# Patient Record
Sex: Male | Born: 1950 | Race: Black or African American | Hispanic: No | Smoking: Never smoker
Health system: Southern US, Community
[De-identification: ages and names within clinical notes are randomized; demographics above are authoritative.]

## PROBLEM LIST (undated history)

## (undated) DIAGNOSIS — I82409 Acute embolism and thrombosis of unspecified deep veins of unspecified lower extremity: Secondary | ICD-10-CM

## (undated) DIAGNOSIS — I639 Cerebral infarction, unspecified: Secondary | ICD-10-CM

## (undated) DIAGNOSIS — C61 Malignant neoplasm of prostate: Secondary | ICD-10-CM

## (undated) DIAGNOSIS — T8611 Kidney transplant rejection: Secondary | ICD-10-CM

## (undated) DIAGNOSIS — I10 Essential (primary) hypertension: Secondary | ICD-10-CM

## (undated) DIAGNOSIS — I739 Peripheral vascular disease, unspecified: Secondary | ICD-10-CM

## (undated) HISTORY — PX: OTHER SURGICAL HISTORY: SHX169

## (undated) HISTORY — DX: Essential (primary) hypertension: I10

## (undated) HISTORY — DX: Acute embolism and thrombosis of unspecified deep veins of unspecified lower extremity: I82.409

## (undated) HISTORY — PX: COLONOSCOPY: SHX174

## (undated) HISTORY — DX: Cerebral infarction, unspecified: I63.9

## (undated) HISTORY — DX: Peripheral vascular disease, unspecified: I73.9

---

## 1997-10-02 ENCOUNTER — Encounter: Admission: RE | Admit: 1997-10-02 | Discharge: 1997-12-31 | Payer: Self-pay | Admitting: *Deleted

## 1998-01-29 ENCOUNTER — Encounter: Admission: RE | Admit: 1998-01-29 | Discharge: 1998-01-29 | Payer: Self-pay | Admitting: Family Medicine

## 1998-03-05 ENCOUNTER — Encounter: Admission: RE | Admit: 1998-03-05 | Discharge: 1998-03-05 | Payer: Self-pay | Admitting: Family Medicine

## 1998-04-16 ENCOUNTER — Encounter: Admission: RE | Admit: 1998-04-16 | Discharge: 1998-07-15 | Payer: Self-pay | Admitting: *Deleted

## 1998-05-29 ENCOUNTER — Encounter: Admission: RE | Admit: 1998-05-29 | Discharge: 1998-05-29 | Payer: Self-pay | Admitting: Family Medicine

## 1998-06-28 ENCOUNTER — Encounter: Admission: RE | Admit: 1998-06-28 | Discharge: 1998-06-28 | Payer: Self-pay | Admitting: Family Medicine

## 1998-07-19 ENCOUNTER — Encounter: Admission: RE | Admit: 1998-07-19 | Discharge: 1998-07-19 | Payer: Self-pay | Admitting: Family Medicine

## 1998-08-30 ENCOUNTER — Encounter: Admission: RE | Admit: 1998-08-30 | Discharge: 1998-08-30 | Payer: Self-pay | Admitting: Family Medicine

## 1998-09-03 ENCOUNTER — Encounter: Admission: RE | Admit: 1998-09-03 | Discharge: 1998-09-03 | Payer: Self-pay | Admitting: Family Medicine

## 1998-09-20 ENCOUNTER — Encounter: Admission: RE | Admit: 1998-09-20 | Discharge: 1998-09-20 | Payer: Self-pay | Admitting: Family Medicine

## 1998-10-15 ENCOUNTER — Encounter: Admission: RE | Admit: 1998-10-15 | Discharge: 1999-01-13 | Payer: Self-pay | Admitting: *Deleted

## 1998-10-26 ENCOUNTER — Encounter: Admission: RE | Admit: 1998-10-26 | Discharge: 1998-10-26 | Payer: Self-pay | Admitting: Family Medicine

## 1998-12-07 ENCOUNTER — Encounter: Admission: RE | Admit: 1998-12-07 | Discharge: 1998-12-07 | Payer: Self-pay | Admitting: *Deleted

## 1999-06-10 ENCOUNTER — Encounter: Admission: RE | Admit: 1999-06-10 | Discharge: 1999-06-10 | Payer: Self-pay | Admitting: Family Medicine

## 1999-06-17 ENCOUNTER — Encounter: Admission: RE | Admit: 1999-06-17 | Discharge: 1999-06-17 | Payer: Self-pay | Admitting: Family Medicine

## 1999-06-24 ENCOUNTER — Encounter: Admission: RE | Admit: 1999-06-24 | Discharge: 1999-06-24 | Payer: Self-pay | Admitting: Family Medicine

## 1999-07-01 ENCOUNTER — Encounter: Admission: RE | Admit: 1999-07-01 | Discharge: 1999-07-01 | Payer: Self-pay | Admitting: Family Medicine

## 1999-07-01 ENCOUNTER — Encounter: Admission: RE | Admit: 1999-07-01 | Discharge: 1999-09-29 | Payer: Self-pay | Admitting: *Deleted

## 1999-07-09 ENCOUNTER — Encounter: Admission: RE | Admit: 1999-07-09 | Discharge: 1999-07-09 | Payer: Self-pay | Admitting: Sports Medicine

## 1999-07-24 ENCOUNTER — Encounter: Admission: RE | Admit: 1999-07-24 | Discharge: 1999-07-24 | Payer: Self-pay | Admitting: Family Medicine

## 2000-02-20 ENCOUNTER — Encounter: Admission: RE | Admit: 2000-02-20 | Discharge: 2000-02-20 | Payer: Self-pay | Admitting: Family Medicine

## 2000-04-08 ENCOUNTER — Encounter: Admission: RE | Admit: 2000-04-08 | Discharge: 2000-04-08 | Payer: Self-pay | Admitting: Family Medicine

## 2000-10-29 ENCOUNTER — Encounter: Admission: RE | Admit: 2000-10-29 | Discharge: 2000-10-29 | Payer: Self-pay | Admitting: Sports Medicine

## 2000-11-10 ENCOUNTER — Encounter: Admission: RE | Admit: 2000-11-10 | Discharge: 2000-11-10 | Payer: Self-pay | Admitting: Family Medicine

## 2000-12-07 ENCOUNTER — Encounter: Admission: RE | Admit: 2000-12-07 | Discharge: 2000-12-07 | Payer: Self-pay | Admitting: Sports Medicine

## 2001-02-09 ENCOUNTER — Encounter: Admission: RE | Admit: 2001-02-09 | Discharge: 2001-02-09 | Payer: Self-pay | Admitting: Family Medicine

## 2001-03-01 ENCOUNTER — Encounter: Admission: RE | Admit: 2001-03-01 | Discharge: 2001-03-01 | Payer: Self-pay | Admitting: Family Medicine

## 2001-03-15 ENCOUNTER — Encounter: Admission: RE | Admit: 2001-03-15 | Discharge: 2001-03-15 | Payer: Self-pay | Admitting: Family Medicine

## 2001-04-26 ENCOUNTER — Encounter: Admission: RE | Admit: 2001-04-26 | Discharge: 2001-04-26 | Payer: Self-pay | Admitting: Family Medicine

## 2001-08-09 ENCOUNTER — Encounter: Admission: RE | Admit: 2001-08-09 | Discharge: 2001-08-09 | Payer: Self-pay | Admitting: Family Medicine

## 2001-09-21 ENCOUNTER — Encounter: Admission: RE | Admit: 2001-09-21 | Discharge: 2001-09-21 | Payer: Self-pay | Admitting: Family Medicine

## 2001-11-22 ENCOUNTER — Encounter: Admission: RE | Admit: 2001-11-22 | Discharge: 2001-11-22 | Payer: Self-pay | Admitting: Family Medicine

## 2002-03-01 ENCOUNTER — Encounter: Admission: RE | Admit: 2002-03-01 | Discharge: 2002-03-01 | Payer: Self-pay | Admitting: Sports Medicine

## 2002-03-29 ENCOUNTER — Encounter: Admission: RE | Admit: 2002-03-29 | Discharge: 2002-03-29 | Payer: Self-pay | Admitting: Sports Medicine

## 2002-05-31 ENCOUNTER — Encounter: Admission: RE | Admit: 2002-05-31 | Discharge: 2002-05-31 | Payer: Self-pay | Admitting: Family Medicine

## 2003-01-16 ENCOUNTER — Encounter: Admission: RE | Admit: 2003-01-16 | Discharge: 2003-01-16 | Payer: Self-pay | Admitting: Sports Medicine

## 2004-01-15 ENCOUNTER — Encounter: Admission: RE | Admit: 2004-01-15 | Discharge: 2004-01-15 | Payer: Self-pay | Admitting: Sports Medicine

## 2004-01-22 ENCOUNTER — Encounter: Admission: RE | Admit: 2004-01-22 | Discharge: 2004-01-22 | Payer: Self-pay | Admitting: Sports Medicine

## 2004-12-19 ENCOUNTER — Ambulatory Visit: Payer: Self-pay | Admitting: Family Medicine

## 2006-03-27 ENCOUNTER — Ambulatory Visit: Payer: Self-pay | Admitting: Family Medicine

## 2006-05-06 ENCOUNTER — Ambulatory Visit: Payer: Self-pay | Admitting: Family Medicine

## 2006-07-30 DIAGNOSIS — I10 Essential (primary) hypertension: Secondary | ICD-10-CM | POA: Insufficient documentation

## 2006-07-30 DIAGNOSIS — E119 Type 2 diabetes mellitus without complications: Secondary | ICD-10-CM | POA: Insufficient documentation

## 2006-07-30 DIAGNOSIS — F5232 Male orgasmic disorder: Secondary | ICD-10-CM | POA: Insufficient documentation

## 2006-07-30 DIAGNOSIS — E669 Obesity, unspecified: Secondary | ICD-10-CM | POA: Insufficient documentation

## 2007-06-07 ENCOUNTER — Telehealth: Payer: Self-pay | Admitting: *Deleted

## 2007-07-02 ENCOUNTER — Ambulatory Visit: Payer: Self-pay | Admitting: Family Medicine

## 2008-01-02 ENCOUNTER — Inpatient Hospital Stay (HOSPITAL_COMMUNITY): Admission: EM | Admit: 2008-01-02 | Discharge: 2008-01-11 | Payer: Self-pay | Admitting: Emergency Medicine

## 2008-01-02 ENCOUNTER — Encounter: Payer: Self-pay | Admitting: Family Medicine

## 2008-01-02 ENCOUNTER — Ambulatory Visit: Payer: Self-pay | Admitting: Internal Medicine

## 2008-01-02 LAB — CONVERTED CEMR LAB: Hgb A1c MFr Bld: 10.3 %

## 2008-01-03 ENCOUNTER — Encounter (INDEPENDENT_AMBULATORY_CARE_PROVIDER_SITE_OTHER): Payer: Self-pay | Admitting: Neurology

## 2008-01-03 ENCOUNTER — Telehealth: Payer: Self-pay | Admitting: *Deleted

## 2008-01-03 DIAGNOSIS — I639 Cerebral infarction, unspecified: Secondary | ICD-10-CM

## 2008-01-03 HISTORY — DX: Cerebral infarction, unspecified: I63.9

## 2008-01-05 ENCOUNTER — Ambulatory Visit: Payer: Self-pay | Admitting: Physical Medicine & Rehabilitation

## 2008-03-23 ENCOUNTER — Encounter: Payer: Self-pay | Admitting: Family Medicine

## 2008-12-22 ENCOUNTER — Ambulatory Visit: Payer: Self-pay | Admitting: Family Medicine

## 2008-12-22 DIAGNOSIS — Z8679 Personal history of other diseases of the circulatory system: Secondary | ICD-10-CM | POA: Insufficient documentation

## 2009-01-03 ENCOUNTER — Encounter: Payer: Self-pay | Admitting: Family Medicine

## 2009-01-03 ENCOUNTER — Ambulatory Visit: Payer: Self-pay | Admitting: Family Medicine

## 2009-01-03 DIAGNOSIS — R252 Cramp and spasm: Secondary | ICD-10-CM | POA: Insufficient documentation

## 2009-01-03 LAB — CONVERTED CEMR LAB
BUN: 17 mg/dL (ref 6–23)
CO2: 23 meq/L (ref 19–32)
Chloride: 103 meq/L (ref 96–112)
Glucose, Bld: 102 mg/dL — ABNORMAL HIGH (ref 70–99)
Potassium: 4 meq/L (ref 3.5–5.3)

## 2009-01-09 ENCOUNTER — Telehealth: Payer: Self-pay | Admitting: Family Medicine

## 2009-01-19 ENCOUNTER — Telehealth: Payer: Self-pay | Admitting: Family Medicine

## 2009-01-31 ENCOUNTER — Ambulatory Visit: Payer: Self-pay | Admitting: Family Medicine

## 2009-02-01 ENCOUNTER — Telehealth: Payer: Self-pay | Admitting: Family Medicine

## 2009-02-02 ENCOUNTER — Telehealth: Payer: Self-pay | Admitting: Family Medicine

## 2009-02-07 ENCOUNTER — Telehealth: Payer: Self-pay | Admitting: Family Medicine

## 2009-02-08 ENCOUNTER — Telehealth: Payer: Self-pay | Admitting: *Deleted

## 2009-02-08 ENCOUNTER — Telehealth: Payer: Self-pay | Admitting: Family Medicine

## 2009-02-14 ENCOUNTER — Telehealth: Payer: Self-pay | Admitting: Family Medicine

## 2009-02-19 ENCOUNTER — Telehealth: Payer: Self-pay | Admitting: Family Medicine

## 2009-02-21 ENCOUNTER — Telehealth (INDEPENDENT_AMBULATORY_CARE_PROVIDER_SITE_OTHER): Payer: Self-pay | Admitting: Family Medicine

## 2009-02-21 ENCOUNTER — Telehealth: Payer: Self-pay | Admitting: Family Medicine

## 2009-02-28 ENCOUNTER — Ambulatory Visit: Payer: Self-pay | Admitting: Family Medicine

## 2009-03-28 ENCOUNTER — Telehealth: Payer: Self-pay | Admitting: Family Medicine

## 2009-04-02 ENCOUNTER — Telehealth: Payer: Self-pay | Admitting: Family Medicine

## 2009-04-09 ENCOUNTER — Ambulatory Visit: Payer: Self-pay | Admitting: Family Medicine

## 2009-04-16 ENCOUNTER — Telehealth: Payer: Self-pay | Admitting: Family Medicine

## 2009-04-17 ENCOUNTER — Telehealth: Payer: Self-pay | Admitting: *Deleted

## 2009-05-09 ENCOUNTER — Encounter (INDEPENDENT_AMBULATORY_CARE_PROVIDER_SITE_OTHER): Payer: Self-pay | Admitting: *Deleted

## 2009-05-28 ENCOUNTER — Ambulatory Visit: Payer: Self-pay | Admitting: Family Medicine

## 2009-08-08 ENCOUNTER — Encounter: Payer: Self-pay | Admitting: Family Medicine

## 2009-08-08 ENCOUNTER — Ambulatory Visit: Payer: Self-pay | Admitting: Family Medicine

## 2009-08-08 LAB — CONVERTED CEMR LAB
HDL: 40 mg/dL (ref >39)
Hgb A1c MFr Bld: 6.9 %
Total CHOL/HDL Ratio: 3.2
VLDL: 9 mg/dL (ref 0–40)

## 2009-08-10 ENCOUNTER — Encounter: Payer: Self-pay | Admitting: Family Medicine

## 2009-08-10 ENCOUNTER — Telehealth: Payer: Self-pay | Admitting: Family Medicine

## 2009-08-17 ENCOUNTER — Telehealth: Payer: Self-pay | Admitting: Family Medicine

## 2009-09-13 ENCOUNTER — Telehealth: Payer: Self-pay | Admitting: *Deleted

## 2009-10-01 ENCOUNTER — Ambulatory Visit: Payer: Self-pay | Admitting: Family Medicine

## 2009-10-02 ENCOUNTER — Encounter: Payer: Self-pay | Admitting: Family Medicine

## 2009-10-03 ENCOUNTER — Ambulatory Visit: Payer: Self-pay | Admitting: Family Medicine

## 2009-10-03 ENCOUNTER — Telehealth: Payer: Self-pay | Admitting: Family Medicine

## 2009-10-04 ENCOUNTER — Telehealth: Payer: Self-pay | Admitting: Family Medicine

## 2009-10-24 ENCOUNTER — Encounter: Payer: Self-pay | Admitting: Family Medicine

## 2009-10-25 ENCOUNTER — Ambulatory Visit: Payer: Self-pay | Admitting: Family Medicine

## 2009-11-28 ENCOUNTER — Encounter: Payer: Self-pay | Admitting: Family Medicine

## 2009-12-04 ENCOUNTER — Ambulatory Visit: Payer: Self-pay | Admitting: Family Medicine

## 2009-12-04 LAB — CONVERTED CEMR LAB: Hgb A1c MFr Bld: 6.3 %

## 2009-12-11 ENCOUNTER — Telehealth: Payer: Self-pay | Admitting: *Deleted

## 2009-12-14 ENCOUNTER — Telehealth: Payer: Self-pay | Admitting: *Deleted

## 2010-02-21 ENCOUNTER — Encounter: Payer: Self-pay | Admitting: Family Medicine

## 2010-02-21 ENCOUNTER — Ambulatory Visit: Payer: Self-pay | Admitting: Family Medicine

## 2010-02-21 DIAGNOSIS — E785 Hyperlipidemia, unspecified: Secondary | ICD-10-CM | POA: Insufficient documentation

## 2010-02-21 LAB — CONVERTED CEMR LAB
CO2: 24 meq/L (ref 19–32)
Calcium: 8.7 mg/dL (ref 8.4–10.5)
Creatinine, Ser: 1.29 mg/dL (ref 0.40–1.50)

## 2010-03-18 ENCOUNTER — Telehealth: Payer: Self-pay | Admitting: Family Medicine

## 2010-03-19 ENCOUNTER — Ambulatory Visit: Payer: Self-pay | Admitting: Family Medicine

## 2010-04-24 ENCOUNTER — Telehealth: Payer: Self-pay | Admitting: Family Medicine

## 2010-05-17 ENCOUNTER — Ambulatory Visit: Payer: Self-pay

## 2010-06-07 ENCOUNTER — Ambulatory Visit: Admission: RE | Admit: 2010-06-07 | Discharge: 2010-06-07 | Payer: Self-pay | Source: Home / Self Care

## 2010-07-02 NOTE — Assessment & Plan Note (Signed)
Summary: fu/kh  Patient arrived late, daughter rescheduled........................Marland Kitchen Garen Grams LPN  November 28, 2009 3:19 PM   Allergies: No Known Drug Allergies   Complete Medication List: 1)  Hydrochlorothiazide 25 Mg Tabs (Hydrochlorothiazide) .... Take 1 tab by mouth every morning 2)  Accupril 40 Mg Tabs (Quinapril hcl) .... Take 1 tab by mouth daily 3)  Crestor 5 Mg Tabs (Rosuvastatin calcium) .... Take 1 tab by mouth daily 4)  Plavix 75 Mg Tabs (Clopidogrel bisulfate) .... One by mouth daily 5)  Multivitamins Tabs (Multiple vitamin) .... One by mouth daily 6)  Sertraline Hcl 100 Mg Tabs (Sertraline hcl) .... One by mouth daily 7)  Baclofen 10 Mg Tabs (Baclofen) .... 1/2 tab by mouth three times a day 8)  Lantus 100 Unit/ml Soln (Insulin glargine) .... Inject 25 u sq bid disp qs for 3 months 9)  Novolog 100 Unit/ml Soln (Insulin aspart) .... Inject 15 u sq before meals and 5 u sq before snacks; disp qs x 3 months 10)  Quad Cane/small Base Misc (Misc. devices) .... Use as directed 11)  Sidekick Blood Glucose System Devi (Blood gluc meter disp-strips) .... Use as directed 12)  Blood Pressure Cuff Misc (Misc. devices) .... Use as directed.  pt. needs a large cuff. 13)  Carvedilol 25 Mg Tabs (Carvedilol) .... Take 1 tab by mouth twice a day 14)  Monoject Insulin Syringe 28g X 1/2" 1 Ml Misc (Insulin syringe-needle u-100) .... Use as directed 15)  Blood Glucose Test Strp (Glucose blood) .... Use as directed 16)  Amlodipine Besylate 10 Mg Tabs (Amlodipine besylate) .... Take 1 tab by mouth daily

## 2010-07-02 NOTE — Assessment & Plan Note (Signed)
Summary: f/u,df   Vital Signs:  Patient profile:   60 year old male Height:      63 inches Weight:      261 pounds BMI:     46.40 BSA:     2.17 Temp:     99.8 degrees F Pulse rate:   64 / minute BP sitting:   179 / 81  Vitals Entered By: Jone Baseman CMA (August 08, 2009 3:34 PM) CC: hx of stroke, htn, dm Is Patient Diabetic? Yes Did you bring your meter with you today? Yes Pain Assessment Patient in pain? no        Primary Care Provider:  Angelena Sole MD  CC:  hx of stroke, htn, and dm.  History of Present Illness: 1. Hx of stroke:  Pt is regaining some more of his right sided strength.  He is not going to physical therapy but has been working on it at home.  He is taking his plavix as prescribed.      ROS: denies any new numbness, weakness or visual problems  2. HTN: Pt has been taking his blood pressure medicine as prescribed.  He doesn't check his blood pressure at home regularly.  He has been trying to watch his salt intake but still thinks that he eats some (likes peanut butter).        ROS: denies any chest pain, shortness of breath, or lower extremity swelling  3. DMII:  He is taking his Insulin as prescribed.  He checks his blood sugar usually once a day.  They range between 90-160.  He doesn't eat a whole lot.  Daughter does a good job trying to get healthy foods.      Habits & Providers  Alcohol-Tobacco-Diet     Tobacco Status: never  Current Medications (verified): 1)  Hydrochlorothiazide 25 Mg  Tabs (Hydrochlorothiazide) .... Take 1 Tab By Mouth Every Morning 2)  Lisinopril 40 Mg Tabs (Lisinopril) .... Take 1 Tablet By Mouth Once A Day 3)  Zocor 20 Mg Tabs (Simvastatin) .... One Tab By Mouth Daily 4)  Plavix 75 Mg Tabs (Clopidogrel Bisulfate) .... One By Mouth Daily 5)  Multivitamins  Tabs (Multiple Vitamin) .... One By Mouth Daily 6)  Sertraline Hcl 100 Mg Tabs (Sertraline Hcl) .... One By Mouth Daily 7)  Baclofen 10 Mg Tabs (Baclofen) .... 1/2  Tab By Mouth Three Times A Day 8)  Lantus 100 Unit/ml Soln (Insulin Glargine) .... Inject 35 Units Subcutaneously Twice A Day; Disp Qs For 3 Months 9)  Novolog 100 Unit/ml Soln (Insulin Aspart) .... Inject 15 Units Subcutaneously Three Times A Day Before Meals; Disp Qs X 3 Months 10)  Quad Cane/small Base  Misc (Misc. Devices) .... Use As Directed 11)  Sidekick Blood Glucose System  Devi (Blood Gluc Meter Disp-Strips) .... Use As Directed 12)  Blood Pressure Cuff  Misc (Misc. Devices) .... Use As Directed.  Pt. Needs A Large Cuff. 13)  Carvedilol 25 Mg Tabs (Carvedilol) .... Take 1 Tab By Mouth Twice A Day 14)  Monoject Insulin Syringe 28g X 1/2" 1 Ml Misc (Insulin Syringe-Needle U-100) .... Use As Directed 15)  Blood Glucose Test  Strp (Glucose Blood) .... Use As Directed 16)  Amlodipine Besylate 10 Mg Tabs (Amlodipine Besylate) .... Take 1/2 Tab By Mouth Daily  Allergies: No Known Drug Allergies  Past History:  Past Medical History: Reviewed history from 12/22/2008 and no changes required. 1. 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 Diabetes.  3. Hypertension.  4. Dyslipidemia.  5. Urinary tract infection.  6. Obesity.  7. diabetic retinopathy  Social History: Reviewed history from 04/09/2009 and no changes required. Discharged from [facility] s/p CVA. Lives with his daughter who helps take care of him.  Has HHRN and HHPT. Family is close by and supportive. No alcohol, smoking, drugs. Daughter Camdin Hegner [phone] (cell),  [phone] (home)  Physical Exam  General:  Vitals reviewed. alert and obese.   Eyes:  vision grossly intact, pupils equal, and pupils round.   Mouth:  fair dentition.   Neck:  supple, no JVD Lungs:  normal respiratory effort.  no crackles and no wheezes.   Heart:  normal rate and regular rhythm.   Abdomen:  soft and non-tender.   Extremities:  no lower  extremity edema Skin:  turgor normal, color normal, no rashes, and no suspicious lesions.   Psych:  full affect. alert and appropriate with exam. not anxious appearing.     Impression & Recommendations:  Problem # 1:  CVA WITH RIGHT HEMIPARESIS (ICD-438.20) Assessment Unchanged  Doing better.  Will work on Patent attorney.  DM well controlled.  Will work on BP.  Will check Lipids. His updated medication list for this problem includes:    Plavix 75 Mg Tabs (Clopidogrel bisulfate) ..... One by mouth daily  Orders: [facility]- Est  Level 4 (29562)  Problem # 2:  HYPERTENSION, BENIGN SYSTEMIC (ICD-401.1) Assessment: Deteriorated Add Norvasc His updated medication list for this problem includes:    Hydrochlorothiazide 25 Mg Tabs (Hydrochlorothiazide) .Marland Kitchen... Take 1 tab by mouth every morning    Lisinopril 40 Mg Tabs (Lisinopril) .Marland Kitchen... Take 1 tablet by mouth once a day    Carvedilol 25 Mg Tabs (Carvedilol) .Marland Kitchen... Take 1 tab by mouth twice a day    Amlodipine Besylate 10 Mg Tabs (Amlodipine besylate) .Marland Kitchen... 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  Will check A1c today. His updated medication list for this problem includes:    Lisinopril 40 Mg Tabs (Lisinopril) .Marland Kitchen... Take 1 tablet by mouth once a day    Lantus 100 Unit/ml Soln (Insulin glargine) ..... Inject 35 units subcutaneously twice a day; disp qs for 3 months    Novolog 100 Unit/ml Soln (Insulin aspart) ..... Inject 15 units subcutaneously three times a day before meals; disp qs x 3 months  Orders: A1C-[facility] (28413) [facility]- Est  Level 4 (24401)  Complete Medication List: 1)  Hydrochlorothiazide 25 Mg Tabs (Hydrochlorothiazide) .... Take 1 tab by mouth every morning 2)  Lisinopril 40 Mg Tabs (Lisinopril) .... Take 1 tablet by mouth once a day 3)  Zocor 20 Mg Tabs  (Simvastatin) .... One tab by mouth daily 4)  Plavix 75 Mg Tabs (Clopidogrel bisulfate) .... One by mouth daily 5)  Multivitamins Tabs (Multiple vitamin) .... One by mouth daily 6)  Sertraline Hcl 100 Mg Tabs (Sertraline hcl) .... One by mouth daily 7)  Baclofen 10 Mg Tabs (Baclofen) .... 1/2 tab by mouth three times a day 8)  Lantus 100 Unit/ml Soln (Insulin glargine) .... Inject 35 units subcutaneously twice a day; disp qs for 3 months 9)  Novolog 100 Unit/ml Soln (Insulin aspart) .... Inject 15 units subcutaneously three times a day before meals; disp qs x 3 months  10)  Insurance underwriter (Misc. devices) .... Use as directed 11)  Sidekick Blood Glucose System Devi (Blood gluc meter disp-strips) .... Use as directed 12)  Blood Pressure Cuff Misc (Misc. devices) .... Use as directed.  pt. needs a large cuff. 13)  Carvedilol 25 Mg Tabs (Carvedilol) .... Take 1 tab by mouth twice a day 14)  Monoject Insulin Syringe 28g X 1/2" 1 Ml Misc (Insulin syringe-needle u-100) .... Use as directed 15)  Blood Glucose Test Strp (Glucose blood) .... Use as directed 16)  Amlodipine Besylate 10 Mg Tabs (Amlodipine besylate) .... Take 1/2 tab by mouth daily  Patient Instructions: 1)  I'm glad to hear that you continue to do better 2)  I would like to try and get your blood pressure under better control 3)  I am going to add another medicine, called Norvasc, start off taking 1/2 tab each day 4)  I am also going to check your cholesterol today and will let you know of the results 5)  Please schedule a follow up appointment in 4-6 weeks Prescriptions: PLAVIX 75 MG TABS (CLOPIDOGREL BISULFATE) one by mouth daily  #90 x 6   Entered and Authorized by:   Angelena Sole MD   Signed by:   Angelena Sole MD on 08/08/2009   Method used:   Print then Give to Patient   RxID:   [id number] AMLODIPINE BESYLATE 10 MG TABS (AMLODIPINE BESYLATE) Take 1/2 tab by mouth daily  #30 x 6   Entered and Authorized by:    Angelena Sole MD   Signed by:   Angelena Sole MD on 08/08/2009   Method used:   Print then Give to Patient   RxID:   [id number]   Prevention & Chronic Care Immunizations   Influenza vaccine: Fluvax 3+  (05/28/2009)    Tetanus booster: 08/31/2001: Done.   Tetanus booster due: 09/01/2011    Pneumococcal vaccine: Not documented  Colorectal Screening   Hemoccult: Done.  (05/02/2002)   Hemoccult due: 05/03/2003    Colonoscopy: Not documented  Other Screening   PSA: Not documented   Smoking status: never  (08/08/2009)  Diabetes Mellitus   HgbA1C: 6.9  (08/08/2009)   Hemoglobin A1C due: 09/30/2007    Eye exam: Not documented    Foot exam: yes  (12/22/2008)   High risk foot: Not documented   Foot care education: Not documented    Urine microalbumin/creatinine ratio: Not documented    Diabetes flowsheet reviewed?: Yes   Progress toward A1C goal: Unchanged  Lipids   Total Cholesterol: Not documented   LDL: Not documented   LDL Direct: Not documented   HDL: Not documented   Triglycerides: Not documented  Hypertension   Last Blood Pressure: 179 / 81  (08/08/2009)   Serum creatinine: 1.15  (01/03/2009)   Serum potassium 4.0  (01/03/2009)    Hypertension flowsheet reviewed?: Yes   Progress toward BP goal: Unchanged  Self-Management Support :   Personal Goals (by the next clinic visit) :     Personal A1C goal: 8  (02/28/2009)     Personal blood pressure goal: 130/80  (02/28/2009)     Personal LDL goal: 100  (02/28/2009)    Diabetes self-management support: Not documented    Hypertension self-management support: Not documented   Laboratory Results   Blood Tests   Date/Time Received: August 08, 2009 3:25 PM  Date/Time Reported: August 08, 2009 3:41 PM   HGBA1C: 6.9%   (Normal Range: Non-Diabetic - 3-6%  Control Diabetic - 6-8%)  Comments: ...............test performed by......Marland KitchenBonnie A. Swaz[place], MLS (ASCP)cm

## 2010-07-02 NOTE — Progress Notes (Signed)
Summary: Referral  Phone Note Call from Patient Call back at [phone]   Caller: Daughter Reason for Call: Referral Summary of Call: requesting dental referral for routine cleaning & possible dental work. Also wants to know if pt should get a shingles vaccine, has orange card.   Initial call taken by: Knox Royalty,  December 11, 2009 10:44 AM  Follow-up for Phone Call        They don't need a dental referral for routine dental care.  We generally recommend shingles vaccine at age 34. Follow-up by: Angelena Sole MD,  December 11, 2009 11:01 AM  Additional Follow-up for Phone Call Additional follow up Details #1::        Left message on vm informing that we would send in a referral to the dental clinic but there is a waiting list, and also informed about the shingles vaccine. Additional Follow-up by: Garen Grams LPN,  December 11, 2009 11:09 AM

## 2010-07-02 NOTE — Progress Notes (Signed)
Summary: refill  Phone Note Refill Request Call back at [phone] Message from:  Sophana-daughter  Refills Requested: Medication #1:  HYDROCHLOROTHIAZIDE 25 MG  TABS Take 1 tab by mouth every morning  Medication #2:  LISINOPRIL 40 MG TABS Take 1 tablet by mouth once a day  Medication #3:  ZOCOR 20 MG TABS one tab by mouth daily  Medication #4:  CARVEDILOL 25 MG TABS Take 1 tab by mouth twice a day Sertraline HCL 100mg  Baclofen 10mg  Novolog 100mg  Carvedilol 25mg   [facility]  Initial call taken by: De Nurse,  September 13, 2009 2:05 PM  Follow-up for Phone Call        calling about rx - not at pharm yet. Follow-up by: De Nurse,  September 17, 2009 11:11 AM    Prescriptions: NOVOLOG 100 UNIT/ML SOLN (INSULIN ASPART) inject 15 units Subcutaneously three times a day before meals; disp QS x 3 months  #1 x 3   Entered by:   Golden Circle RN   Authorized by:   Angelena Sole MD   Signed by:   Golden Circle RN on 09/17/2009   Method used:   Printed then faxed to ...       [facility] (retail)       [address] [place], [place]  [address]       Ph: [phone]       Fax: [phone]   RxID:   [id number] HYDROCHLOROTHIAZIDE 25 MG  TABS (HYDROCHLOROTHIAZIDE) Take 1 tab by mouth every morning  #180 x 3   Entered by:   Golden Circle RN   Authorized by:   Angelena Sole MD   Signed by:   Golden Circle RN on 09/17/2009   Method used:   Printed then faxed to ...       [facility] (retail)       [address] [place], [place]  [address]       Ph: [phone]       Fax: [phone]   RxID:   [id number]  dtr ststes she got most of these transferred but need the novalog. used to get from [place]. using health dept now. told her each med has refills. faxed rx for novalog.Golden Circle RN  September 17, 2009 11:20 AM

## 2010-07-02 NOTE — Assessment & Plan Note (Signed)
Summary: [name]   Vital Signs:  Patient profile:   60 year old male Height:      63 inches Weight:      259.2 pounds BMI:     46.08 Temp:     97.5 degrees F oral Pulse rate:   63 / minute BP sitting:   120 / 70  (left arm) Cuff size:   large  Vitals Entered By: Gladstone Pih (Oct 03, 2009 11:10 AM) CC: Discuss DM Is Patient Diabetic? Yes Did you bring your meter with you today? Yes   Primary Care Provider:  Angelena Sole MD  CC:  Discuss DM.  History of Present Illness: 1. Hypoglycemic episode:  Pt brought in by his daughter because he had a hypoglycemic episode last night.  He called her last night and was not making any sense.  His blood sugar was low.  Once he got something to eat he felt better.  This happens every once in a while maybe once a week.  It only happens if he doesn't have something at his bedside like fruit to eat when he starts feeling hypoglycemic.  He normally has a friend come by and bring him some fruit but he hasn't had any for the past couple of days.  Daughter is upset because he will not call and ask his family for help when he needs it.  He wouldn't have these hypoglycemic episodes if he was prepared.  He feels fine now in clinic.  Current Medications (verified): 1)  Hydrochlorothiazide 25 Mg  Tabs (Hydrochlorothiazide) .... Take 1 Tab By Mouth Every Morning 2)  Lisinopril 40 Mg Tabs (Lisinopril) .... Take 1 Tablet By Mouth Once A Day 3)  Zocor 20 Mg Tabs (Simvastatin) .... One Tab By Mouth Daily 4)  Plavix 75 Mg Tabs (Clopidogrel Bisulfate) .... One By Mouth Daily 5)  Multivitamins  Tabs (Multiple Vitamin) .... One By Mouth Daily 6)  Sertraline Hcl 100 Mg Tabs (Sertraline Hcl) .... One By Mouth Daily 7)  Baclofen 10 Mg Tabs (Baclofen) .... 1/2 Tab By Mouth Three Times A Day 8)  Lantus 100 Unit/ml Soln (Insulin Glargine) .... Inject 35 Units Subcutaneously Twice A Day; Disp Qs For 3 Months 9)  Novolog 100 Unit/ml Soln (Insulin Aspart) .... Inject 15  Units Subcutaneously Three Times A Day Before Meals; Disp Qs X 3 Months 10)  Quad Cane/small Base  Misc (Misc. Devices) .... Use As Directed 11)  Sidekick Blood Glucose System  Devi (Blood Gluc Meter Disp-Strips) .... Use As Directed 12)  Blood Pressure Cuff  Misc (Misc. Devices) .... Use As Directed.  Pt. Needs A Large Cuff. 13)  Carvedilol 25 Mg Tabs (Carvedilol) .... Take 1 Tab By Mouth Twice A Day 14)  Monoject Insulin Syringe 28g X 1/2" 1 Ml Misc (Insulin Syringe-Needle U-100) .... Use As Directed 15)  Blood Glucose Test  Strp (Glucose Blood) .... Use As Directed 16)  Amlodipine Besylate 10 Mg Tabs (Amlodipine Besylate) .... Take 1 Tab By Mouth Daily  Allergies (verified): No Known Drug Allergies  Past History:  Past Medical History: Reviewed history from 12/22/2008 and no changes required. 1. 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 Diabetes.  3. Hypertension.  4. Dyslipidemia.  5. Urinary tract infection.  6. Obesity.  7. diabetic retinopathy  Physical Exam  General:  Vitals reviewed. alert and obese.   Head:  normocephalic  and atraumatic.   Eyes:  vision grossly intact, pupils equal, and pupils round.   Mouth:  fair dentition.  MMM Lungs:  normal respiratory effort.  no crackles and no wheezes.   Heart:  normal rate and regular rhythm.   Abdomen:  soft and non-tender.   Neurologic:  Neuro deficits unchanged, right sided hemiparesis  Psych:  not anxious appearing and not depressed appearing.     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  Can be prevented with proper education and preparation.  Educated patient and daughter about acceptable snacks to have if feeling like his blood sugar is low including: fruits, granola.  HgA1C is well controlled so do not want to  make any medication changes.  If he is unable to plan ahead and have something available will need to decrease his evening dose of Lantus. His updated medication list for this problem includes:    Lisinopril 40 Mg Tabs (Lisinopril) .Marland Kitchen... Take 1 tablet by mouth once a day    Lantus 100 Unit/ml Soln (Insulin glargine) ..... Inject 35 units subcutaneously twice a day; disp qs for 3 months    Novolog 100 Unit/ml Soln (Insulin aspart) ..... Inject 15 units subcutaneously three times a day before meals; disp qs x 3 months  Orders: [facility]- Est Level  3 (16109)  Complete Medication List: 1)  Hydrochlorothiazide 25 Mg Tabs (Hydrochlorothiazide) .... Take 1 tab by mouth every morning 2)  Lisinopril 40 Mg Tabs (Lisinopril) .... Take 1 tablet by mouth once a day 3)  Zocor 20 Mg Tabs (Simvastatin) .... One tab by mouth daily 4)  Plavix 75 Mg Tabs (Clopidogrel bisulfate) .... One by mouth daily 5)  Multivitamins Tabs (Multiple vitamin) .... One by mouth daily 6)  Sertraline Hcl 100 Mg Tabs (Sertraline hcl) .... One by mouth daily 7)  Baclofen 10 Mg Tabs (Baclofen) .... 1/2 tab by mouth three times a day 8)  Lantus 100 Unit/ml Soln (Insulin glargine) .... Inject 35 units subcutaneously twice a day; disp qs for 3 months 9)  Novolog 100 Unit/ml Soln (Insulin aspart) .... Inject 15 units subcutaneously three times a day before meals; disp qs x 3 months 10)  Quad Cane/small Base Misc (Misc. devices) .... Use as directed 11)  Sidekick Blood Glucose System Devi (Blood gluc meter disp-strips) .... Use as directed 12)  Blood Pressure Cuff Misc (Misc. devices) .... Use as directed.  pt. needs a large cuff. 13)  Carvedilol 25 Mg Tabs (Carvedilol) .... Take 1 tab by mouth twice a day 14)  Monoject Insulin Syringe 28g X 1/2" 1 Ml Misc (Insulin syringe-needle u-100) .... Use as directed 15)  Blood Glucose Test Strp (Glucose blood) .... Use as directed 16)  Amlodipine Besylate 10 Mg Tabs (Amlodipine besylate) .... Take 1  tab by mouth daily  Patient Instructions: 1)  It is important for you to have something at the bedside when you start to feel like your blood sugar is low 2)  Good options are fruits 3)  If you continue to have low blood sugars we may need to decrease your insulin dose.

## 2010-07-02 NOTE — Assessment & Plan Note (Signed)
Summary: F/U VISIT/tcb   Vital Signs:  Patient profile:   60 year old male Height:      63 inches Weight:      258 pounds BMI:     45.87 BSA:     2.16 Temp:     98.4 degrees F Pulse rate:   82 / minute BP sitting:   149 / 73  Vitals Entered By: Jone Baseman CMA (Oct 01, 2009 2:13 PM) CC: f/u Is Patient Diabetic? No Pain Assessment Patient in pain? no        Primary Care Provider:  Angelena Sole MD  CC:  f/u.  History of Present Illness: 1. Hx of stroke:  Pt is regaining some more of his right sided strength.  He is not going to physical therapy but has been working on it at home.  He is taking his plavix as prescribed.      ROS: denies any new numbness, weakness or visual problems  2. HTN: Pt has been taking his blood pressure medicine as prescribed.  He does check his blood pressure at home regularly.  It has been ranging between 129/80 - 150/90.  He has been trying to watch his salt intake but still thinks that he eats some.  Daughter has made him cut back on peanut butter to one small jar a month.        ROS: denies any chest pain, shortness of breath, or lower extremity swelling  3. DMII:  He is taking his Insulin as prescribed.  He checks his blood sugar usually once a day.  They range between 40-210.  He doesn't eat a whole lot.  Daughter does a good job trying to get healthy foods.     Habits & Providers  Alcohol-Tobacco-Diet     Tobacco Status: never  Current Medications (verified): 1)  Hydrochlorothiazide 25 Mg  Tabs (Hydrochlorothiazide) .... Take 1 Tab By Mouth Every Morning 2)  Lisinopril 40 Mg Tabs (Lisinopril) .... Take 1 Tablet By Mouth Once A Day 3)  Zocor 20 Mg Tabs (Simvastatin) .... One Tab By Mouth Daily 4)  Plavix 75 Mg Tabs (Clopidogrel Bisulfate) .... One By Mouth Daily 5)  Multivitamins  Tabs (Multiple Vitamin) .... One By Mouth Daily 6)  Sertraline Hcl 100 Mg Tabs (Sertraline Hcl) .... One By Mouth Daily 7)  Baclofen 10 Mg Tabs  (Baclofen) .... 1/2 Tab By Mouth Three Times A Day 8)  Lantus 100 Unit/ml Soln (Insulin Glargine) .... Inject 35 Units Subcutaneously Twice A Day; Disp Qs For 3 Months 9)  Novolog 100 Unit/ml Soln (Insulin Aspart) .... Inject 15 Units Subcutaneously Three Times A Day Before Meals; Disp Qs X 3 Months 10)  Quad Cane/small Base  Misc (Misc. Devices) .... Use As Directed 11)  Sidekick Blood Glucose System  Devi (Blood Gluc Meter Disp-Strips) .... Use As Directed 12)  Blood Pressure Cuff  Misc (Misc. Devices) .... Use As Directed.  Pt. Needs A Large Cuff. 13)  Carvedilol 25 Mg Tabs (Carvedilol) .... Take 1 Tab By Mouth Twice A Day 14)  Monoject Insulin Syringe 28g X 1/2" 1 Ml Misc (Insulin Syringe-Needle U-100) .... Use As Directed 15)  Blood Glucose Test  Strp (Glucose Blood) .... Use As Directed 16)  Amlodipine Besylate 10 Mg Tabs (Amlodipine Besylate) .... Take 1 Tab By Mouth Daily  Allergies: No Known Drug Allergies  Past History:  Past Medical History: Reviewed history from 12/22/2008 and no changes required. 1. 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 Diabetes.  3. Hypertension.  4. Dyslipidemia.  5. Urinary tract infection.  6. Obesity.  7. diabetic retinopathy  Social History: Reviewed history from 04/09/2009 and no changes required. Discharged from [facility] s/p CVA. Lives with his daughter who helps take care of him.  Has HHRN and HHPT. Family is close by and supportive. No alcohol, smoking, drugs. Daughter Saquan Furtick [phone] (cell),  [phone] (home)  Physical Exam  General:  Vitals reviewed. alert and obese.   Eyes:  vision grossly intact, pupils equal, and pupils round.   Mouth:  fair dentition.   Neck:  supple, no JVD Lungs:  normal respiratory effort.  no crackles and no wheezes.   Heart:  normal rate and regular rhythm.   Extremities:  no lower extremity  edema Neurologic:  Neuro deficits unchanged, right sided hemiparesis  Psych:  full affect. alert and appropriate with exam. not anxious appearing.     Impression & Recommendations:  Problem # 1:  CVA WITH RIGHT HEMIPARESIS (ICD-438.20) Assessment Unchanged  Continue Plavix His updated medication list for this problem includes:    Plavix 75 Mg Tabs (Clopidogrel bisulfate) ..... One by mouth daily  Orders: [facility]- Est  Level 4 (19147)  Problem # 2:  HYPERTENSION, BENIGN SYSTEMIC (ICD-401.1) Assessment: Improved  Still not at goal.  Will increase Amlodipine to 10mg  daily. His updated medication list for this problem includes:    Hydrochlorothiazide 25 Mg Tabs (Hydrochlorothiazide) .Marland Kitchen... Take 1 tab by mouth every morning    Lisinopril 40 Mg Tabs (Lisinopril) .Marland Kitchen... Take 1 tablet by mouth once a day    Carvedilol 25 Mg Tabs (Carvedilol) .Marland Kitchen... Take 1 tab by mouth twice a day    Amlodipine Besylate 10 Mg Tabs (Amlodipine besylate) .Marland Kitchen... 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  CBGs on monitor are fairly well controlled.  Continue current regimen. His updated medication list for this problem includes:    Lisinopril 40 Mg Tabs (Lisinopril) .Marland Kitchen... Take 1 tablet by mouth once a day    Lantus 100 Unit/ml Soln (Insulin glargine) ..... Inject 35 units subcutaneously twice a day; disp qs for 3 months    Novolog 100 Unit/ml Soln (Insulin aspart) ..... Inject 15 units subcutaneously three times a day before meals; disp qs x 3 months  Orders: [facility]- Est  Level 4 (21308)  Complete Medication List: 1)  Hydrochlorothiazide 25 Mg Tabs (Hydrochlorothiazide) .... Take 1 tab by mouth every morning 2)  Lisinopril 40 Mg Tabs (Lisinopril) .... Take 1 tablet by mouth once a day 3)  Zocor 20 Mg Tabs (Simvastatin) .... One tab by mouth daily 4)  Plavix 75 Mg Tabs (Clopidogrel bisulfate) .... One by mouth  daily 5)  Multivitamins Tabs (Multiple vitamin) .... One by mouth daily 6)  Sertraline Hcl 100 Mg Tabs (Sertraline hcl) .... One by mouth daily 7)  Baclofen 10 Mg Tabs (Baclofen) .... 1/2 tab by mouth three times a day 8)  Lantus 100 Unit/ml Soln (Insulin glargine) .... Inject 35 units subcutaneously twice a day; disp qs for 3 months 9)  Novolog 100 Unit/ml Soln (Insulin aspart) .... Inject 15 units subcutaneously three times a day before meals; disp qs x 3 months 10)  Quad Cane/small Base Misc (Misc. devices) .... Use as directed 11)  Sidekick Blood Glucose System Devi (Blood gluc meter disp-strips) .... Use as directed 12)  Blood Pressure Cuff Misc (Misc. devices) .... Use as directed.  pt. needs a large cuff. 13)  Carvedilol 25 Mg Tabs (Carvedilol) .... Take 1 tab by mouth twice a day 14)  Monoject Insulin Syringe 28g X 1/2" 1 Ml Misc (Insulin syringe-needle u-100) .... Use as directed 15)  Blood Glucose Test Strp (Glucose blood) .... Use as directed 16)  Amlodipine Besylate 10 Mg Tabs (Amlodipine besylate) .... Take 1 tab by mouth daily  Patient Instructions: 1)  I am going to increase your Amlodipine to 10mg  daily (1 tab) 2)  Please schedule a follow up appointment in 2 months Prescriptions: LANTUS 100 UNIT/ML SOLN (INSULIN GLARGINE) inject 35 units Subcutaneously twice a day; disp QS for 3 months  #1 x 3   Entered and Authorized by:   Angelena Sole MD   Signed by:   Angelena Sole MD on 10/01/2009   Method used:   Print then Give to Patient   RxID:   [id number] AMLODIPINE BESYLATE 10 MG TABS (AMLODIPINE BESYLATE) Take 1 tab by mouth daily  #90 x 3   Entered and Authorized by:   Angelena Sole MD   Signed by:   Angelena Sole MD on 10/01/2009   Method used:   Print then Give to Patient   RxID:   [id number]

## 2010-07-02 NOTE — Progress Notes (Signed)
Summary: triage   Phone Note Call from Patient Call back at [phone]   Caller: Daughter- Leatrice Jewels Summary of Call: She called and wants dr. see her father today for sugar episode Initial call taken by: [employer] Jean Rosenthal,  March 18, 2010 10:56 AM  Follow-up for Phone Call        had a low blood sugar yesterday. she went over there. cbg was 89. he had sugar & then ate food. lives alone. she buys the food but "he lies to me" she is not sure he is eating regularly. wants him checked. will see pcp tomorrow at 1:30. asked her to bring the meter Follow-up by: Golden Circle RN,  March 18, 2010 2:09 PM

## 2010-07-02 NOTE — Assessment & Plan Note (Signed)
Summary: f/u eo   Vital Signs:  Patient profile:   60 year old male Height:      63 inches Weight:      260.3 pounds BMI:     46.28 Temp:     98.2 degrees F oral Pulse rate:   80 / minute BP sitting:   144 / 77  (left arm) Cuff size:   large  Vitals Entered By: Garen Grams LPN (December 04, 1608 9:07 AM) CC: f/u dm Is Patient Diabetic? Yes Did you bring your meter with you today? No Pain Assessment Patient in pain? no        Diabetic Foot Exam Foot Inspection Is there a history of a foot ulcer?              No Is there a foot ulcer now?              No Can the patient see the bottom of their feet?          No Are the shoes appropriate in style and fit?          Yes Is there swelling or an abnormal foot shape?          No Are the toenails long?                Yes Are the toenails thick?                Yes Are the toenails ingrown?              No Is there heavy callous build-up?              No Is there pain in the calf muscle (Intermittent claudication) when walking?    NoIs there a claw toe deformity?              No Is there elevated skin temperature?            No Is there limited ankle dorsiflexion?            No Is there foot or ankle muscle weakness?            Yes  Diabetic Foot Care Education Patient educated on appropriate care of diabetic feet.   High Risk Feet? Yes Set Next Diabetic Foot Exam here: 12/10/2010   Primary Care Provider:  Angelena Sole MD  CC:  f/u dm.  History of Present Illness: 1. DM:  Here for follow up on his hypoglycemia episodes.  We decreased his Lantus to 25 U two times a day which he is now taking.  His novolog is still at 15 U three times a day with meals.  He has not had any further hypoglycemic episodes.  His blood sugars are still pretty well controlled.  Average  ~130.        ROS: denies chest pain, shortness of breath, polyuria, polydypsia.  Endorses occassional numbness               of his left hand and left foot but it is  now normal  2. Social issues:  His daughter helps take care of him.  She is starting to get tired and doesn't feel like she is able to help him as much as he needs.  The cannot afford any personal care services or for a nurse to come out to the house.  Habits & Providers  Alcohol-Tobacco-Diet     Tobacco Status: never  Current  Medications (verified): 1)  Hydrochlorothiazide 25 Mg  Tabs (Hydrochlorothiazide) .... Take 1 Tab By Mouth Every Morning 2)  Accupril 40 Mg Tabs (Quinapril Hcl) .... Take 1 Tab By Mouth Daily 3)  Crestor 5 Mg Tabs (Rosuvastatin Calcium) .... Take 1 Tab By Mouth Daily 4)  Plavix 75 Mg Tabs (Clopidogrel Bisulfate) .... One By Mouth Daily 5)  Multivitamins  Tabs (Multiple Vitamin) .... One By Mouth Daily 6)  Sertraline Hcl 100 Mg Tabs (Sertraline Hcl) .... One By Mouth Daily 7)  Baclofen 10 Mg Tabs (Baclofen) .... 1/2 Tab By Mouth Three Times A Day 8)  Lantus 100 Unit/ml Soln (Insulin Glargine) .... Inject 25 U Sq Bid Disp Qs For 3 Months 9)  Novolog 100 Unit/ml Soln (Insulin Aspart) .... Inject 15 U Sq Before Meals and 5 U Sq Before Snacks; Disp Qs X 3 Months 10)  Quad Cane/small Base  Misc (Misc. Devices) .... Use As Directed 11)  Sidekick Blood Glucose System  Devi (Blood Gluc Meter Disp-Strips) .... Use As Directed 12)  Blood Pressure Cuff  Misc (Misc. Devices) .... Use As Directed.  Pt. Needs A Large Cuff. 13)  Carvedilol 25 Mg Tabs (Carvedilol) .... Take 1 Tab By Mouth Twice A Day 14)  Monoject Insulin Syringe 28g X 1/2" 1 Ml Misc (Insulin Syringe-Needle U-100) .... Use As Directed 15)  Blood Glucose Test  Strp (Glucose Blood) .... Use As Directed 16)  Amlodipine Besylate 10 Mg Tabs (Amlodipine Besylate) .... Take 1 Tab By Mouth Daily  Allergies: No Known Drug Allergies  Past History:  Past Medical History: Reviewed history from 12/22/2008 and no changes required. 1. Left pontine infarct with right hemiparesis and dysarthria       secondary to small-vessel  disease (01/02/08) - 11 month stay at [facility] 2. Diabetes.  3. Hypertension.  4. Dyslipidemia.  5. Urinary tract infection.  6. Obesity.  7. diabetic retinopathy  Social History: Reviewed history from 04/09/2009 and no changes required. Discharged from [facility] s/p CVA. Lives with his daughter who helps take care of him.  Has HHRN and HHPT. Family is close by and supportive. No alcohol, smoking, drugs. Daughter Makya Yurko [phone] (cell),  [phone] (home)  Physical Exam  General:  Vitals reviewed. alert and obese.   Eyes:  vision grossly intact, pupils equal, and pupils round.   Neck:  supple, no JVD Lungs:  normal respiratory effort.  no crackles and no wheezes.   Heart:  normal rate and regular rhythm.   Abdomen:  soft and non-tender.   Extremities:  1+ lower extremity edema Neurologic:  Neuro deficits unchanged, right sided hemiparesis  Skin:  turgor normal, color normal, no rashes, and no suspicious lesions.  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  Will recheck HgA1C at 3 months. His updated medication list for this problem includes:    Accupril 40 Mg Tabs (Quinapril hcl) .Marland Kitchen... Take 1 tab by mouth daily    Lantus 100 Unit/ml Soln (Insulin glargine) ..... Inject 25 u sq bid disp qs for 3 months    Novolog 100 Unit/ml Soln (Insulin aspart) ..... Inject 15 u sq before meals and 5 u sq before snacks; disp qs x 3 months  Orders: A1C-[facility] (40814) [facility]- Est Level  3 (48185)  Problem # 2:  CVA WITH RIGHT HEMIPARESIS (ICD-438.20) Assessment: Comment Only  Daughter is starting to feel  like she cannot care for him as much as he needs.  Will contact [facility] for help with services. His updated medication list for this problem includes:    Plavix 75 Mg Tabs (Clopidogrel bisulfate) ..... One by mouth daily  Orders: [facility]-  Est Level  3 (16109)  Complete Medication List: 1)  Hydrochlorothiazide 25 Mg Tabs (Hydrochlorothiazide) .... Take 1 tab by mouth every morning 2)  Accupril 40 Mg Tabs (Quinapril hcl) .... Take 1 tab by mouth daily 3)  Crestor 5 Mg Tabs (Rosuvastatin calcium) .... Take 1 tab by mouth daily 4)  Plavix 75 Mg Tabs (Clopidogrel bisulfate) .... One by mouth daily 5)  Multivitamins Tabs (Multiple vitamin) .... One by mouth daily 6)  Sertraline Hcl 100 Mg Tabs (Sertraline hcl) .... One by mouth daily 7)  Baclofen 10 Mg Tabs (Baclofen) .... 1/2 tab by mouth three times a day 8)  Lantus 100 Unit/ml Soln (Insulin glargine) .... Inject 25 u sq bid disp qs for 3 months 9)  Novolog 100 Unit/ml Soln (Insulin aspart) .... Inject 15 u sq before meals and 5 u sq before snacks; disp qs x 3 months 10)  Quad Cane/small Base Misc (Misc. devices) .... Use as directed 11)  Sidekick Blood Glucose System Devi (Blood gluc meter disp-strips) .... Use as directed 12)  Blood Pressure Cuff Misc (Misc. devices) .... Use as directed.  pt. needs a large cuff. 13)  Carvedilol 25 Mg Tabs (Carvedilol) .... Take 1 tab by mouth twice a day 14)  Monoject Insulin Syringe 28g X 1/2" 1 Ml Misc (Insulin syringe-needle u-100) .... Use as directed 15)  Blood Glucose Test Strp (Glucose blood) .... Use as directed 16)  Amlodipine Besylate 10 Mg Tabs (Amlodipine besylate) .... Take 1 tab by mouth daily  Laboratory Results   Blood Tests   Date/Time Received: December 04, 2009 9:06 AM  Date/Time Reported: December 04, 2009 9:16 AM   HGBA1C: 6.3%   (Normal Range: Non-Diabetic - 3-6%   Control Diabetic - 6-8%)  Comments: ...............test performed by......Marland KitchenBonnie A. [place], MLS (ASCP)cm  Date/Time Received:  Date/Time Reported:     Prevention & Chronic Care Immunizations   Influenza vaccine: Fluvax 3+  (05/28/2009)    Tetanus booster: 08/31/2001: Done.   Tetanus booster due: 09/01/2011    Pneumococcal vaccine: Not  documented  Colorectal Screening   Hemoccult: Done.  (05/02/2002)   Hemoccult due: 05/03/2003    Colonoscopy: Not documented  Other Screening   PSA: Not documented   Smoking status: never  (12/04/2009)  Diabetes Mellitus   HgbA1C: 6.3  (12/04/2009)   Hemoglobin A1C due: 09/30/2007    Eye exam: Not documented    Foot exam: yes  (12/22/2008)   Foot exam action/deferral: Do today   High risk foot: Yes  (12/04/2009)   Foot care education: Done  (12/04/2009)   Foot exam due: 12/10/2010    Urine microalbumin/creatinine ratio: Not documented  Lipids   Total Cholesterol: 129  (08/08/2009)   LDL: 80  (08/08/2009)   LDL Direct: Not documented   HDL: 40  (08/08/2009)   Triglycerides: 44  (08/08/2009)  Hypertension   Last Blood Pressure: 144 / 77  (12/04/2009)   Serum creatinine: 1.15  (01/03/2009)   Serum potassium 4.0  (01/03/2009)  Self-Management Support :   Personal Goals (by the next clinic visit) :     Personal A1C goal: 8  (02/28/2009)     Personal blood pressure goal: 130/80  (02/28/2009)  Personal LDL goal: 100  (02/28/2009)    Diabetes self-management support: Not documented    Hypertension self-management support: Not documented   Nursing Instructions: Diabetic foot exam today

## 2010-07-02 NOTE — Miscellaneous (Signed)
Summary: found on floor  Clinical Lists Changes dtr came in to report she found him on floor this am. EMS came & cbg was 32. they brought him around with glucose IV and food. brought up to 172. discuss use of glucose paste. buy & keep on hand. unable to bring him in today. appt tomorrow at 1:30 with pcp.  states he does not eat regularly & the quality foods he should. she will call him frequently today & stop by his home again. he is divorced so has no one to help him stay on track.  explained need for small frequent meals of high quality. suggested appt with Dr. Gerilyn Pilgrim. Marland KitchenGolden Circle RN  Oct 24, 2009 11:29 AM

## 2010-07-02 NOTE — Assessment & Plan Note (Signed)
Summary: f/u hypoglycemia/[place]/   Vital Signs:  Patient profile:   60 year old male Height:      63 inches Weight:      259 pounds BMI:     46.05 Temp:     98.8 degrees F oral Pulse rate:   60 / minute BP sitting:   124 / 69  (left arm) Cuff size:   large  Vitals Entered By: Garen Grams LPN (Oct 25, 2009 1:56 PM) CC: f/u hypoglycemia Is Patient Diabetic? Yes Did you bring your meter with you today? No Pain Assessment Patient in pain? no        Primary Care Provider:  Angelena Sole MD  CC:  f/u hypoglycemia.  History of Present Illness: 1. Hypoglycemia:  Pt comes in for an office visit today to f/u on hypoglycemic episodes.  Most recently, two days ago, he was found down by his daughter in the bathroom and was unresponsive.  EMS was called and his CBG was 32, after he received IV glucose and ate something he was back to normal.  The reason why this happened is because he felt like he was hypoglycemic that morning even though his blood sugars were 90.  He drank a glass of orange juice to make him feel better.  Since he didn't want his blood sugar to spike he took an additional 15 Units of Novolog with the orange juice.  Shortly after that is when all of this happened.  Over the past couple of weeks he has had some low blood sugars in the mornings.  This was discussed at last office visit to eat some fruit or have a snack at night before bed to keep it from being too low in the morning.  He was doing that well and wasn't having any problems.  He did run out of fruit 3 days ago ago and didn't eat any the night before the episode.  His blood sugars overall have been pretty well controlled.  Averaging 90-160.  Current Medications (verified): 1)  Hydrochlorothiazide 25 Mg  Tabs (Hydrochlorothiazide) .... Take 1 Tab By Mouth Every Morning 2)  Lisinopril 40 Mg Tabs (Lisinopril) .... Take 1 Tablet By Mouth Once A Day 3)  Zocor 20 Mg Tabs (Simvastatin) .... One Tab By Mouth Daily 4)  Plavix  75 Mg Tabs (Clopidogrel Bisulfate) .... One By Mouth Daily 5)  Multivitamins  Tabs (Multiple Vitamin) .... One By Mouth Daily 6)  Sertraline Hcl 100 Mg Tabs (Sertraline Hcl) .... One By Mouth Daily 7)  Baclofen 10 Mg Tabs (Baclofen) .... 1/2 Tab By Mouth Three Times A Day 8)  Lantus 100 Unit/ml Soln (Insulin Glargine) .... Inject 35 Units Subcutaneously in The Morning and 25 Units in The Evening Disp Qs For 3 Months 9)  Novolog 100 Unit/ml Soln (Insulin Aspart) .... Inject 15 Units Subcutaneously Three Times A Day Before Meals; Disp Qs X 3 Months 10)  Quad Cane/small Base  Misc (Misc. Devices) .... Use As Directed 11)  Sidekick Blood Glucose System  Devi (Blood Gluc Meter Disp-Strips) .... Use As Directed 12)  Blood Pressure Cuff  Misc (Misc. Devices) .... Use As Directed.  Pt. Needs A Large Cuff. 13)  Carvedilol 25 Mg Tabs (Carvedilol) .... Take 1 Tab By Mouth Twice A Day 14)  Monoject Insulin Syringe 28g X 1/2" 1 Ml Misc (Insulin Syringe-Needle U-100) .... Use As Directed 15)  Blood Glucose Test  Strp (Glucose Blood) .... Use As Directed 16)  Amlodipine Besylate 10 Mg  Tabs (Amlodipine Besylate) .... Take 1 Tab By Mouth Daily  Allergies: No Known Drug Allergies  Past History:  Past Medical History: Reviewed history from 12/22/2008 and no changes required. 1. 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 Diabetes.  3. Hypertension.  4. Dyslipidemia.  5. Urinary tract infection.  6. Obesity.  7. diabetic retinopathy  Social History: Reviewed history from 04/09/2009 and no changes required. Discharged from [facility] s/p CVA. Lives with his daughter who helps take care of him.  Has HHRN and HHPT. Family is close by and supportive. No alcohol, smoking, drugs. Daughter Tyr Franca [phone] (cell),  [phone] (home)  Physical Exam  General:  Vitals reviewed. alert and obese.   Head:   normocephalic and atraumatic.   Eyes:  vision grossly intact, pupils equal, and pupils round.   Mouth:  fair dentition.  MMM Neck:  supple, no JVD Lungs:  normal respiratory effort.  no crackles and no wheezes.   Heart:  normal rate and regular rhythm.   Abdomen:  soft and non-tender.   Neurologic:  Neuro deficits unchanged, right sided hemiparesis  Skin:  turgor normal, color normal, no rashes, and no suspicious lesions.   Psych:  not anxious appearing and not depressed appearing.   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  However, he seems to be having some persistent hypoglycemia in the mornings.  Will pull back on the evening dose of Lantus and see how he does.  Also educated him about being prepared with a snack / fruit available.  See him back in 4-6 weeks. His updated medication list for this problem includes:    Lisinopril 40 Mg Tabs (Lisinopril) .Marland Kitchen... Take 1 tablet by mouth once a day    Lantus 100 Unit/ml Soln (Insulin glargine) ..... Inject 35 units subcutaneously in the morning and 25 units in the evening disp qs for 3 months    Novolog 100 Unit/ml Soln (Insulin aspart) ..... Inject 15 units subcutaneously three times a day before meals; disp qs x 3 months  Orders: Glucose Cap-[facility] (19147)  Complete Medication List: 1)  Hydrochlorothiazide 25 Mg Tabs (Hydrochlorothiazide) .... Take 1 tab by mouth every morning 2)  Lisinopril 40 Mg Tabs (Lisinopril) .... Take 1 tablet by mouth once a day 3)  Zocor 20 Mg Tabs (Simvastatin) .... One tab by mouth daily 4)  Plavix 75 Mg Tabs (Clopidogrel bisulfate) .... One by mouth daily 5)  Multivitamins Tabs (Multiple vitamin) .... One by mouth daily 6)  Sertraline Hcl 100 Mg Tabs (Sertraline hcl) .... One by mouth daily 7)  Baclofen 10 Mg Tabs (Baclofen) .... 1/2 tab  by mouth three times a day 8)  Lantus 100 Unit/ml Soln (Insulin glargine) .... Inject 35 units subcutaneously in the morning and 25 units in the evening disp qs for 3 months 9)  Novolog 100 Unit/ml Soln (Insulin aspart) .... Inject 15 units subcutaneously three times a day before meals; disp qs x 3 months 10)  Quad Cane/small Base Misc (Misc. devices) .... Use as directed 11)  Sidekick Blood Glucose System Devi (Blood gluc meter disp-strips) .... Use as directed 12)  Blood Pressure Cuff Misc (Misc. devices) .... Use as directed.  pt. needs a large cuff. 13)  Carvedilol 25 Mg Tabs (Carvedilol) .... Take 1 tab by mouth twice a day 14)  Monoject Insulin Syringe 28g X 1/2" 1 Ml Misc (Insulin syringe-needle u-100) .... Use as directed 15)  Blood Glucose Test Strp (Glucose blood) .... Use as directed 16)  Amlodipine Besylate 10 Mg Tabs (Amlodipine besylate) .... Take 1 tab by mouth daily  Patient Instructions: 1)  I think that we need to adjust your Insulin dosing to make sure that you don't continue to have hypoglycemic episodes.   2)  The main reason why you had this last episode was because of the extra Novolog that you took.  Only take the Novolog when you eat a full meal. 3)  I am going to pull back on the evening dose of Lantus to 25 Units 4)  Please monitor your morning blood sugars closely over the next couple of weeks 5)  Please schedule a follow up appointment with me in 4-6 weeks  Appended Document: f/u hypoglycemia/[place]/    Clinical Lists Changes  Orders: Added new Test order of [facility]- Est Level  3 (41660) - Signed

## 2010-07-02 NOTE — Progress Notes (Signed)
Summary: phn msg  Phone Note Call from Patient Call back at [phone]   Caller: daughter-Sofana Summary of Call: has a question about his blood sugar readings. Initial call taken by: De Nurse,  Oct 03, 2009 9:27 AM  Follow-up for Phone Call        spoke with dtr. had hypoglycemic episode last night. he had called dtr & was confused. did not remember the call. he had an apple by the bed & ate that. she came to [facility] & gave him a breakfast bar. states this is happening alot. he refused to eat regular meals. eats one meal per day & snacks on fruit etc thru the day. lives alone appt made with pcp today to review again the need to eat regularly or discuss meds. he will be here at 11am Follow-up by: Golden Circle RN,  Oct 03, 2009 9:36 AM

## 2010-07-02 NOTE — Progress Notes (Signed)
Summary: [facility]  Phone Note Call from Patient Call back at [facility] Phone [phone]   Summary of Call: Says that there is a generic for Plavics (Clopidogrel) is this so and can he get this one due to cost? Initial call taken by: Clydell Hakim,  August 17, 2009 3:42 PM  Follow-up for Phone Call        will forward  to MD. Follow-up by: Theresia Lo RN,  August 17, 2009 3:46 PM  Additional Follow-up for Phone Call Additional follow up Details #1::        I do not think that there is a generic for Plavix.  If the pharmacy will substitute his prescription will already allow for generics to be substitued. Additional Follow-up by: Angelena Sole MD,  August 18, 2009 9:53 AM    Additional Follow-up for Phone Call Additional follow up Details #2::    patient notified. he was shopping on line for pharmacy in [place]. advised that we cannot send RX to [place]. but if there is a generic his rx at his current pharmacy will allow for generic. Follow-up by: Theresia Lo RN,  August 20, 2009 11:44 AM

## 2010-07-02 NOTE — Assessment & Plan Note (Signed)
Summary: f/u,df   Vital Signs:  Patient profile:   59 year old male Height:      63 inches Weight:      266 pounds BMI:     47.29 BSA:     2.18 Temp:     99.6 degrees F Pulse rate:   80 / minute BP sitting:   140 / 80  Vitals Entered By: Jone Baseman CMA (February 21, 2010 9:15 AM) CC: f/u Is Patient Diabetic? No Pain Assessment Patient in pain? no        Primary Care Provider:  Angelena Sole MD  CC:  f/u.  History of Present Illness: 1. DMII:  He is taking his Insulin as prescribed.  His blood sugars at home have been around 90-160.  He has not had any further hypoglycemic episodes.  He is trying to watch the amount of carbs that he eats and his daughter is helping with that.  ROS: denies numbness / weakness  2. HTN:  Taking blood pressure medicines as directed.  He doesn't check his blood pressure at home regularly.  He hasn't taken his medications this morning.  ROS: denies chest pain, shortness of breath  3. Hyperlipidemia:  He is taking the Simvastatin and Crestor because he didn't know that they were the same type of medicine.   ROS: denies leg pain or claudication  Habits & Providers  Alcohol-Tobacco-Diet     Tobacco Status: never  Current Medications (verified): 1)  Hydrochlorothiazide 25 Mg  Tabs (Hydrochlorothiazide) .... Take 1 Tab By Mouth Every Morning 2)  Accupril 40 Mg Tabs (Quinapril Hcl) .... Take 1 Tab By Mouth Daily 3)  Crestor 5 Mg Tabs (Rosuvastatin Calcium) .... Take 1 Tab By Mouth Daily 4)  Plavix 75 Mg Tabs (Clopidogrel Bisulfate) .... One By Mouth Daily 5)  Multivitamins  Tabs (Multiple Vitamin) .... One By Mouth Daily 6)  Sertraline Hcl 100 Mg Tabs (Sertraline Hcl) .... One By Mouth Daily 7)  Baclofen 10 Mg Tabs (Baclofen) .... 1/2 Tab By Mouth Three Times A Day 8)  Lantus 100 Unit/ml Soln (Insulin Glargine) .... Inject 25 U Sq Bid Disp Qs For 3 Months 9)  Novolog 100 Unit/ml Soln (Insulin Aspart) .... Inject 15 U Sq Before Meals  and 5 U Sq Before Snacks; Disp Qs X 3 Months 10)  Quad Cane/small Base  Misc (Misc. Devices) .... Use As Directed 11)  Sidekick Blood Glucose System  Devi (Blood Gluc Meter Disp-Strips) .... Use As Directed 12)  Blood Pressure Cuff  Misc (Misc. Devices) .... Use As Directed.  Pt. Needs A Large Cuff. 13)  Carvedilol 25 Mg Tabs (Carvedilol) .... Take 1 Tab By Mouth Twice A Day 14)  Monoject Insulin Syringe 28g X 1/2" 1 Ml Misc (Insulin Syringe-Needle U-100) .... Use As Directed 15)  Blood Glucose Test  Strp (Glucose Blood) .... Use As Directed 16)  Amlodipine Besylate 10 Mg Tabs (Amlodipine Besylate) .... Take 1 Tab By Mouth Daily  Allergies: No Known Drug Allergies  Past History:  Past Medical History: Reviewed history from 12/22/2008 and no changes required. 1. 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 Diabetes.  3. Hypertension.  4. Dyslipidemia.  5. Urinary tract infection.  6. Obesity.  7. diabetic retinopathy  Social History: Reviewed history from 04/09/2009 and no changes required. Discharged from [facility] s/p CVA. Lives with his daughter who helps take  care of him.  Has HHRN and HHPT. Family is close by and supportive. No alcohol, smoking, drugs. Daughter Lebron Nauert [phone] (cell),  [phone] (home)  Physical Exam  General:  Vitals reviewed. alert and obese.   Mouth:  fair dentition.  MMM Neck:  supple, no JVD Lungs:  normal respiratory effort.  no crackles and no wheezes.   Heart:  normal rate and regular rhythm.   Abdomen:  soft and non-tender.   Extremities:  1+ lower extremity edema Neurologic:  Neuro deficits unchanged, right sided hemiparesis  Skin:  turgor normal, color normal, no rashes, and no suspicious lesions.  No foot ulcers Psych:  not anxious appearing and not depressed appearing.    Diabetes Management Exam:    Foot Exam (with socks and/or shoes  not present):       Sensory-Pinprick/Light touch:          Left medial foot (L-4): normal          Left dorsal foot (L-5): normal          Left lateral foot (S-1): normal          Right medial foot (L-4): normal          Right dorsal foot (L-5): normal          Right lateral foot (S-1): normal       Sensory-Monofilament:          Left foot: normal          Right foot: normal       Inspection:          Left foot: normal          Right foot: normal       Nails:          Left foot: too long          Right foot: too long   Impression & Recommendations:  Problem # 1:  DIABETES MELLITUS, II, 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   No changes. His updated medication list for this problem includes:    Accupril 40 Mg Tabs (Quinapril hcl) .Marland Kitchen... Take 1 tab by mouth daily    Lantus 100 Unit/ml Soln (Insulin glargine) ..... Inject 25 u sq bid disp qs for 3 months    Novolog 100 Unit/ml Soln (Insulin aspart) ..... Inject 15 u sq before meals and 5 u sq before snacks; disp qs x 3 months  Orders: [facility]- Est  Level 4 (13244)  Problem # 2:  HYPERTENSION, BENIGN SYSTEMIC (ICD-401.1) Assessment: Unchanged Blood pressure at goal.  Continue current medications.  Check Creatinine today. His updated medication list for this problem includes:    Hydrochlorothiazide 25 Mg Tabs (Hydrochlorothiazide) .Marland Kitchen... Take 1 tab by mouth every morning    Accupril 40 Mg Tabs (Quinapril hcl) .Marland Kitchen... Take 1 tab by mouth daily    Carvedilol 25 Mg Tabs (Carvedilol) .Marland Kitchen... Take 1 tab by mouth twice a day    Amlodipine Besylate 10 Mg Tabs (Amlodipine besylate) .Marland Kitchen... Take 1 tab by mouth daily  Orders: T-Basic Metabolic Panel [id number]) [facility]- Est  Level 4 (44034)  Problem # 3:  DYSLIPIDEMIA (ICD-272.4) Assessment: Unchanged  Advised that they can continue the Simvastatin until they run out and then switch to Crestor but to not  take both at the same time. His updated medication list for this problem includes:    Crestor 5 Mg Tabs (Rosuvastatin calcium) .Marland KitchenMarland KitchenMarland KitchenMarland Kitchen  Take 1 tab by mouth daily  Orders: [facility]- Est  Level 4 (78295)  Complete Medication List: 1)  Hydrochlorothiazide 25 Mg Tabs (Hydrochlorothiazide) .... Take 1 tab by mouth every morning 2)  Accupril 40 Mg Tabs (Quinapril hcl) .... Take 1 tab by mouth daily 3)  Crestor 5 Mg Tabs (Rosuvastatin calcium) .... Take 1 tab by mouth daily 4)  Plavix 75 Mg Tabs (Clopidogrel bisulfate) .... One by mouth daily 5)  Multivitamins Tabs (Multiple vitamin) .... One by mouth daily 6)  Sertraline Hcl 100 Mg Tabs (Sertraline hcl) .... One by mouth daily 7)  Baclofen 10 Mg Tabs (Baclofen) .... 1/2 tab by mouth three times a day 8)  Lantus 100 Unit/ml Soln (Insulin glargine) .... Inject 25 u sq bid disp qs for 3 months 9)  Novolog 100 Unit/ml Soln (Insulin aspart) .... Inject 15 u sq before meals and 5 u sq before snacks; disp qs x 3 months 10)  Quad Cane/small Base Misc (Misc. devices) .... Use as directed 11)  Sidekick Blood Glucose System Devi (Blood gluc meter disp-strips) .... Use as directed 12)  Blood Pressure Cuff Misc (Misc. devices) .... Use as directed.  pt. needs a large cuff. 13)  Carvedilol 25 Mg Tabs (Carvedilol) .... Take 1 tab by mouth twice a day 14)  Monoject Insulin Syringe 28g X 1/2" 1 Ml Misc (Insulin syringe-needle u-100) .... Use as directed 15)  Blood Glucose Test Strp (Glucose blood) .... Use as directed 16)  Amlodipine Besylate 10 Mg Tabs (Amlodipine besylate) .... Take 1 tab by mouth daily  Other Orders: Influenza Vaccine NON MCR (62130)  Patient Instructions: 1)  You are doing great 2)  We will not make any changes today 3)  Don't take the Simvastatin and the Crestor at the same time 4)  Please schedule a follow up appointment with me in 3 months  Prevention & Chronic Care Immunizations   Influenza vaccine: Fluvax Non-MCR  (02/21/2010)     Tetanus booster: 08/31/2001: Done.   Tetanus booster due: 09/01/2011    Pneumococcal vaccine: Not documented  Colorectal Screening   Hemoccult: Done.  (05/02/2002)   Hemoccult due: 05/03/2003    Colonoscopy: Not documented  Other Screening   PSA: Not documented   Smoking status: never  (02/21/2010)  Diabetes Mellitus   HgbA1C: 6.3  (12/04/2009)   Hemoglobin A1C due: 09/30/2007    Eye exam: Not documented    Foot exam: yes  (02/21/2010)   Foot exam action/deferral: Do today   High risk foot: Yes  (12/04/2009)   Foot care education: Done  (12/04/2009)   Foot exam due: 12/10/2010    Urine microalbumin/creatinine ratio: Not documented    Diabetes flowsheet reviewed?: Yes   Progress toward A1C goal: At goal  Lipids   Total Cholesterol: 129  (08/08/2009)   LDL: 80  (08/08/2009)   LDL Direct: Not documented   HDL: 40  (08/08/2009)   Triglycerides: 44  (08/08/2009)    SGOT (AST): Not documented   SGPT (ALT): Not documented   Alkaline phosphatase: Not documented   Total bilirubin: Not documented  Hypertension   Last Blood Pressure: 140 / 80  (02/21/2010)   Serum creatinine: 1.15  (01/03/2009)   BMP action: Ordered   Serum potassium 4.0  (01/03/2009)    Hypertension flowsheet reviewed?: Yes   Progress toward BP goal: At goal  Self-Management Support :   Personal Goals (by the next clinic visit) :     Personal A1C goal: 8  (  02/28/2009)     Personal blood pressure goal: 130/80  (02/28/2009)     Personal LDL goal: 100  (02/28/2009)    Diabetes self-management support: Not documented    Hypertension self-management support: Not documented    Lipid self-management support: Not documented    Immunizations Administered:  Influenza Vaccine # 1:    Vaccine Type: Fluvax Non-MCR    Site: left deltoid    Mfr: GlaxoSmithKline    Dose: 0.5 ml    Route: IM    Given by: Jone Baseman CMA    Exp. Date: 11/27/2010    Lot #: [id number]    VIS given: 12/25/09 version  given February 21, 2010.  Flu Vaccine Consent Questions:    Do you have a history of severe allergic reactions to this vaccine? no    Any prior history of allergic reactions to egg and/or gelatin? no    Do you have a sensitivity to the preservative Thimersol? no    Do you have a past history of Guillan-Barre Syndrome? no    Do you currently have an acute febrile illness? no    Have you ever had a severe reaction to latex? no    Vaccine information given and explained to patient? yes

## 2010-07-02 NOTE — Progress Notes (Signed)
Summary: triage  Phone Note Call from Patient Call back at Home Phone [phone]   Caller: Patient Summary of Call: Will it be o.k. to substitute the Novelog for the Lantas.  He is having finicial issues and can not get the Lantas right now.   Initial call taken by: Clydell Hakim,  Oct 04, 2009 10:14 AM  Follow-up for Phone Call        told him not to substitute. told him we have 2 bottles here he may have. states he will send his dtr after it Follow-up by: Golden Circle RN,  Oct 04, 2009 10:21 AM  Additional Follow-up for Phone Call Additional follow up Details #1::        gave dtr 2 bottles of lantus Additional Follow-up by: Golden Circle RN,  Oct 04, 2009 4:22 PM    Additional Follow-up for Phone Call Additional follow up Details #2::    If money is an issue we can switch from Lantus to NPH but not to Novolog.  Please have hime come in for an appointment to discuss Follow-up by: Angelena Sole MD,  Oct 08, 2009 8:44 AM

## 2010-07-02 NOTE — Progress Notes (Signed)
Summary: Rx [facility]  Phone Note Call from Patient Call back at [facility] Phone [phone]   Caller: Patient Summary of Call: Pt said that he thinks he is supposed to start back taking Norvas and if this correct he will need a rx for this.  [facility]  But he will also need to be called to let him know that he is to start it back. Initial call taken by: Clydell Hakim,  August 10, 2009 9:56 AM  Follow-up for Phone Call        called patient and he does have Rx for amlopidine. explained to him that this is the Norvac. he voices understanding. Follow-up by: Theresia Lo RN,  August 10, 2009 12:23 PM

## 2010-07-02 NOTE — Progress Notes (Signed)
Summary: [facility]  Phone Note Call from Patient Call back at [phone]   Reason for Call: Talk to Nurse Summary of Call: req to speak with RN re: meds, pt trying to go thru map program & they are replaced 2 meds, daughter needs to know which ones they are replacing. Initial call taken by: Knox Royalty,  December 14, 2009 2:43 PM  Follow-up for Phone Call        Left message on vm for pt to call back Follow-up by: Garen Grams LPN,  December 17, 2009 9:06 AM  Additional Follow-up for Phone Call Additional follow up Details #1::        Left another message, with await callback from patient. Additional Follow-up by: Garen Grams LPN,  December 18, 2009 1:37 PM

## 2010-07-02 NOTE — Progress Notes (Signed)
Summary: phn msg  Phone Note Call from Patient Call back at [phone]   Caller: daughter-Sophona Summary of Call: is asking what crestor is replacing. Initial call taken by: De Nurse,  April 24, 2010 4:28 PM  Follow-up for Phone Call        spoke with daughter and from our notes it appears that patient has been on Crestor since June2011. she also asks about Accupril and it also appears patient has been on this since June.  looks like he was changed from  lisinopril to accupril in June.  she was told at pharmacy what these meds replaced but she can't remember and Dr. Lelon Perla also told her at last visit what they replaced and she can't remember. advised she can bring in all his meds and we can  make sure he has all correct meds .  Follow-up by: Theresia Lo RN,  April 24, 2010 4:44 PM

## 2010-07-02 NOTE — Letter (Signed)
Summary: Generic Letter  [facility]  [address]   [place], [place] [address]   Phone: [phone]  Fax: [phone]    08/10/2009  [name] [address] [place], [place]  [address]  Dear Mr. Perfect,  Here is a copy of your cholesterol report.  It looks great!  Tests: (1) Lipid Profile (52841)   Cholesterol               129 mg/dL                   3-244     ATP III Classification:           < 200        mg/dL        Desirable          200 - 239     mg/dL        Borderline High          >= 240        mg/dL        High         Triglyceride              44 mg/dL                    <010   HDL Cholesterol           40 mg/dL                    >27   Total Chol/HDL Ratio      3.2 Ratio  VLDL Cholesterol (Calc)                             9 mg/dL                     2-53  LDL Cholesterol (Calc)                             80 mg/dL                    6-64           Total Cholesterol/HDL Ratio:CHD Risk                            Coronary Heart Disease Risk Table                                            Men       Women              1/2 Average Risk              3.4        3.3                  Average Risk              5.0        4.4              2 X Average Risk              9.6  7.1              3 X Average Risk             23.4       11.0     Use the calculated Patient Ratio above and the CHD Risk table      to determine the patient's CHD Risk.     ATP III Classification (LDL):           < 100        mg/dL         Optimal          100 - 129     mg/dL         Near or Above Optimal          130 - 159     mg/dL         Borderline High          160 - 189     mg/dL         High           > 190        mg/dL         Very High   Sincerely,   Angelena Sole MD  Appended Document: Generic Letter pt letter mailed

## 2010-07-02 NOTE — Assessment & Plan Note (Signed)
Summary: episodes of low cbg/[place]   Vital Signs:  Patient profile:   60 year old male Height:      63 inches Weight:      261.25 pounds BMI:     46.45 BSA:     2.17 Temp:     99.2 degrees F Pulse rate:   62 / minute BP sitting:   158 / 73  Vitals Entered By: Delora Fuel (March 19, 2010 1:48 PM)  Serial Vital Signs/Assessments:  Time      Position  BP       Pulse  Resp  Temp     By                     140/83                         Angelena Sole MD  CC: low CBG's Is Patient Diabetic? Yes Did you bring your meter with you today? No Pain Assessment Patient in pain? no        Primary Care Provider:  Angelena Sole MD  CC:  low CBG's.  History of Present Illness: 1. Hypoglycemia:  Pt presents with daughter because of one episode of low blood sugar on Sunday morning.  Pt was confused on Sunday morning around 9 am.  He had a strange conversation with his daughter about the color of peanuts.  He ate some candy and then checked his blood sugar and it was 89.  He normally keeps a mixture of peanuts and raisins at his bedside but he had run out.  Otherwise his blood sugars have been well controlled.  He checks his blood sugars about twice a day.  Average is around 120.  ROS: denies fevers, shakiness, dizziness, chest pain, shortness of breath  2. HTN: taking his medicines as prescribed.  Doesn't check his blood pressure at home regularly.  ROS: denies numbness / weakness  Habits & Providers  Alcohol-Tobacco-Diet     Tobacco Status: never  Current Medications (verified): 1)  Hydrochlorothiazide 25 Mg  Tabs (Hydrochlorothiazide) .... Take 1 Tab By Mouth Every Morning 2)  Accupril 40 Mg Tabs (Quinapril Hcl) .... Take 1 Tab By Mouth Daily 3)  Crestor 5 Mg Tabs (Rosuvastatin Calcium) .... Take 1 Tab By Mouth Daily 4)  Plavix 75 Mg Tabs (Clopidogrel Bisulfate) .... One By Mouth Daily 5)  Multivitamins  Tabs (Multiple Vitamin) .... One By Mouth Daily 6)  Sertraline Hcl  100 Mg Tabs (Sertraline Hcl) .... One By Mouth Daily 7)  Baclofen 10 Mg Tabs (Baclofen) .... 1/2 Tab By Mouth Three Times A Day 8)  Lantus 100 Unit/ml Soln (Insulin Glargine) .... Inject 25 U in The Morning and 20 U in The Evening 9)  Novolog 100 Unit/ml Soln (Insulin Aspart) .... Inject 15 U Sq Before Meals and 5 U Sq Before Snacks; Disp Qs X 3 Months 10)  Quad Cane/small Base  Misc (Misc. Devices) .... Use As Directed 11)  Sidekick Blood Glucose System  Devi (Blood Gluc Meter Disp-Strips) .... Use As Directed 12)  Blood Pressure Cuff  Misc (Misc. Devices) .... Use As Directed.  Pt. Needs A Large Cuff. 13)  Carvedilol 25 Mg Tabs (Carvedilol) .... Take 1 Tab By Mouth Twice A Day 14)  Monoject Insulin Syringe 28g X 1/2" 1 Ml Misc (Insulin Syringe-Needle U-100) .... Use As Directed 15)  Blood Glucose Test  Strp (Glucose Blood) .... Use As Directed 16)  Amlodipine Besylate 10 Mg Tabs (Amlodipine Besylate) .... Take 1 Tab By Mouth Daily  Allergies: No Known Drug Allergies  Past History:  Past Medical History: Reviewed history from 12/22/2008 and no changes required. 1. 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 Diabetes.  3. Hypertension.  4. Dyslipidemia.  5. Urinary tract infection.  6. Obesity.  7. diabetic retinopathy  Physical Exam  General:  Vitals reviewed. alert and obese.   Eyes:  vision grossly intact, pupils equal, and pupils round.   Mouth:  fair dentition.  MMM Neck:  no LAD Lungs:  normal respiratory effort.  no crackles and no wheezes.   Heart:  normal rate and regular rhythm.   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  He only had one episode of hypoglycemia and otherwise  blood sugars right where we want them to be.  Will decrease the evening Lantus a little bit and monitor CBGs. His updated medication list for this problem includes:    Accupril 40 Mg Tabs (Quinapril hcl) .Marland Kitchen... Take 1 tab by mouth daily    Lantus 100 Unit/ml Soln (Insulin glargine) ..... Inject 25 u in the morning and 20 u in the evening    Novolog 100 Unit/ml Soln (Insulin aspart) ..... Inject 15 u sq before meals and 5 u sq before snacks; disp qs x 3 months  Orders: [facility]1C-[facility] ([address]) [facility]- Est Level  3 ([address])  Problem # 2:  HYPERTENSION, BENIGN SYSTEMIC (ICD-401.1) Assessment: Unchanged  Initially elevated but improved with recheck.  No changes today.  Will continue to monitor. His updated medication list for this problem includes:    Hydrochlorothiazide 25 Mg Tabs (Hydrochlorothiazide) .Marland Kitchen... Take 1 tab by mouth every morning    Accupril 40 Mg Tabs (Quinapril hcl) .Marland Kitchen... Take 1 tab by mouth daily    Carvedilol 25 Mg Tabs (Carvedilol) .Marland Kitchen... Take 1 tab by mouth twice a day    Amlodipine Besylate 10 Mg Tabs (Amlodipine besylate) .Marland Kitchen... Take 1 tab by mouth daily  Orders: [facility]- Est Level  3 ([address])  Complete Medication List: 1)  Hydrochlorothiazide 25 Mg Tabs (Hydrochlorothiazide) .... Take 1 tab by mouth every morning 2)  Accupril 40 Mg Tabs (Quinapril hcl) .... Take 1 tab by mouth daily 3)  Crestor 5 Mg Tabs (Rosuvastatin calcium) .... Take 1 tab by mouth daily 4)  Plavix 75 Mg Tabs (Clopidogrel bisulfate) .... One by mouth daily 5)  Multivitamins Tabs (Multiple vitamin) .... One by mouth daily 6)  Sertraline Hcl 100 Mg Tabs (Sertraline hcl) .... One by mouth daily 7)  Baclofen 10 Mg Tabs (Baclofen) .... 1/2 tab by mouth three times a day 8)  Lantus 100 Unit/ml Soln (Insulin glargine) .... Inject 25 u in the morning and 20 u in the evening 9)  Novolog 100 Unit/ml Soln (Insulin aspart) .... Inject 15 u sq before meals and 5 u sq before snacks; disp qs x 3 months 10)  Quad Cane/small Base  Misc (Misc. devices) .... Use as directed 11)  Sidekick Blood Glucose System Devi (Blood gluc meter disp-strips) .... Use as directed 12)  Blood Pressure Cuff Misc (Misc. devices) .... Use as directed.  pt. needs a large cuff. 13)  Carvedilol 25 Mg Tabs (  Carvedilol) .... Take 1 tab by mouth twice a day 14)  Monoject Insulin Syringe 28g X 1/2" 1 Ml Misc (Insulin syringe-needle u-100) .... Use as directed 15)  Blood Glucose Test Strp (Glucose blood) .... Use as directed 16)  Amlodipine Besylate 10 Mg Tabs (Amlodipine besylate) .... Take 1 tab by mouth daily  Patient Instructions: 1)  Your A1C number was great at 6.0 2)  I am going to decrease your lantus at night a little bit to 20 Units 3)  Please schedule an appointment in 8 weeks to follow up   Orders Added: 1)  A1C-[facility] [83036] 2)  [facility]- Est Level  3 [99213]    Laboratory Results   Blood Tests   Date/Time Received: March 19, 2010 1:45 PM  Date/Time Reported: March 19, 2010 1:55 PM   HGBA1C: 6.0%   (Normal Range: Non-Diabetic - 3-6%   Control Diabetic - 6-8%)  Comments: ...............test performed by......Marland KitchenBonnie A. [place]az[place], MLS (ASCP)cm

## 2010-07-02 NOTE — Miscellaneous (Signed)
Summary: Patient Assistanc Program  Clinical Lists Changes [name] daughter, [name], completed patient info.  Will need as soon as possible.  Requesting to have fax to company [employer]  Oct 02, 2009 2:17 PM we do not do pt assistance. [facility] them to [facility] that program has a doctor that will review & refill meds thru the program. please call pt dtr back & return the forms.Golden Circle RN  Oct 02, 2009 2:53 PM  Changed Lisinopril to Accupril and Simvastatin to Lipitor because those were free with the MAP.  Also simplified Insulin regimen to what he was actually taking Angelena Sole MD  November 26, 2009 12:36 PM   Medications: Changed medication from LISINOPRIL 40 MG TABS (LISINOPRIL) Take 1 tablet by mouth once a day to ACCUPRIL 40 MG TABS (QUINAPRIL HCL) take 1 tab by mouth daily - Signed Changed medication from ZOCOR 20 MG TABS (SIMVASTATIN) one tab by mouth daily to CRESTOR 5 MG TABS (ROSUVASTATIN CALCIUM) Take 1 tab by mouth daily - Signed Changed medication from NOVOLOG 100 UNIT/ML SOLN (INSULIN ASPART) inject 15 units Subcutaneously three times a day before meals; disp QS x 3 months to NOVOLOG 100 UNIT/ML SOLN (INSULIN ASPART) inject 15 U SQ before meals and 5 U SQ before snacks; disp QS x 3 months - Signed Changed medication from LANTUS 100 UNIT/ML SOLN (INSULIN GLARGINE) inject 35 units Subcutaneously in the morning and 25 units in the evening disp QS for 3 months to LANTUS 100 UNIT/ML SOLN (INSULIN GLARGINE) inject 25 U SQ BID disp QS for 3 months - Signed Rx of ACCUPRIL 40 MG TABS (QUINAPRIL HCL) take 1 tab by mouth daily;  #90 x 3;  Signed;  Entered by: Angelena Sole MD;  Authorized by: [employer];  Method used: Faxed to [facility], [address] [place], [place], [place]  [address], Ph: [phone], Fax: [phone] Rx of CRESTOR 5 MG TABS (ROSUVASTATIN CALCIUM) Take 1 tab by mouth daily;  #90 x 3;  Signed;  Entered by: Angelena Sole MD;  Authorized by: [employer];  Method used: Faxed to [facility], [address] [place], [place], [place]  [address], Ph: [phone], Fax: [phone] Rx of PLAVIX 75 MG TABS (CLOPIDOGREL BISULFATE) one by mouth daily;  #90 x 3;  Signed;  Entered by: Angelena Sole MD;  Authorized by: [employer];  Method used: Faxed to [facility], [address] [place], [place], [place]  [address], Ph: [phone], Fax: [phone] Rx of SERTRALINE HCL 100 MG TABS (SERTRALINE HCL) one by mouth daily;  #90 x 3;  Signed;  Entered by: Angelena Sole MD;  Authorized by: [employer];  Method used: Faxed to [facility], [address] [place], [place], [place]  [address], Ph: [phone], Fax: [phone] Rx of LANTUS 100 UNIT/ML SOLN (INSULIN GLARGINE) inject 25 U SQ BID disp QS for 3 months;  #1 x 3;  Signed;  Entered by: Angelena Sole MD;  Authorized by: [employer];  Method used: Faxed to [facility], [address] [place], [place], [place]  [address], Ph: [phone], Fax: [phone] Rx of NOVOLOG 100 UNIT/ML SOLN (INSULIN ASPART) inject 15 U SQ before meals and 5 U SQ before snacks; disp QS x 3 months;  #1 x 3;  Signed;  Entered by: Angelena Sole MD;  Authorized by: [employer];  Method used: Faxed to [facility], [address] [place], [place], [place]  [address], Ph:  [phone], Fax: [phone] Rx of CARVEDILOL 25 MG TABS (CARVEDILOL) Take 1 tab by mouth twice a day;  #180 x 3;  Signed;  Entered by: Angelena Sole MD;  Authorized by: [employer];  Method used: Faxed to [facility], [address] [place], [place], [place]  [address], Ph: [phone], Fax: [phone] Rx of AMLODIPINE BESYLATE 10 MG TABS (AMLODIPINE BESYLATE) Take 1 tab by mouth daily;  #90 x 3;  Signed;  Entered by: Angelena Sole MD;  Authorized by: [employer];  Method used: Faxed to [facility], [address] [place], [place], [place]  [address], Ph: [phone], Fax: [phone]    Prescriptions: AMLODIPINE BESYLATE 10 MG TABS (AMLODIPINE BESYLATE) Take 1 tab by mouth daily  #90 x 3   Entered by:   Angelena Sole MD   Authorized by:   [employer]   Signed by:   Angelena Sole MD on 11/26/2009   Method used:   Faxed to ...       [facility] Department (retail)       [address] [place], [place]  [address]       Ph: [phone]       Fax: [phone]   RxID:   [id number] CARVEDILOL 25 MG TABS (CARVEDILOL) Take 1 tab by mouth twice a day  #180 x 3   Entered by:   Angelena Sole MD   Authorized by:   [employer]   Signed by:   Angelena Sole MD on 11/26/2009   Method used:   Faxed to ...       [facility] Department (retail)       [address] [place], [place]  [address]       Ph: [phone]       Fax: [phone]   RxID:   [id number] NOVOLOG 100 UNIT/ML SOLN (INSULIN ASPART) inject 15 U SQ before meals and 5 U SQ before snacks; disp QS x 3 months  #1 x 3   Entered by:   Angelena Sole MD   Authorized by:   [employer]   Signed by:   Angelena Sole MD on 11/26/2009   Method used:   Faxed to ...       [facility] Department (retail)       [address] [place], [place]  [address]       Ph: [phone]       Fax: [phone]   RxID:   [id number] LANTUS 100 UNIT/ML SOLN (INSULIN GLARGINE) inject 25 U SQ BID disp QS for 3 months  #1 x 3   Entered by:   Angelena Sole MD   Authorized by:   [employer]   Signed by:   Angelena Sole MD on 11/26/2009   Method used:   Faxed to ...       [facility] Department (retail)       [address] [place], [place]  [address]       Ph: [phone]       Fax: [phone]   RxID:   [id number] SERTRALINE HCL 100 MG TABS (SERTRALINE HCL) one by mouth daily  #90 x 3   Entered by:   Angelena Sole MD    Authorized by:   [employer]   Signed by:   Angelena Sole MD on 11/26/2009  Method used:   Faxed to ...       [facility] Department (retail)       [address] [place], [place]  [address]       Ph: [phone]       Fax: [phone]   RxID:   [id number] PLAVIX 75 MG TABS (CLOPIDOGREL BISULFATE) one by mouth daily  #90 x 3   Entered by:   Angelena Sole MD   Authorized by:   [employer]u   Signed by:   Angelena Sole MD on 11/26/2009   Method used:   Faxed to ...       [facility] Department (retail)       [address] [place], [place]  [address]       Ph: [phone]       Fax: [phone]   RxID:   [id number] CRESTOR 5 MG TABS (ROSUVASTATIN CALCIUM) Take 1 tab by mouth daily  #90 x 3   Entered by:   Angelena Sole MD   Authorized by:   [employer] Miu   Signed by:   Angelena Sole MD on 11/26/2009   Method used:   Faxed to ...       [facility] Department (retail)       [address] [place], [place]  [address]       Ph: [phone]       Fax: [phone]   RxID:   [id number] ACCUPRIL 40 MG TABS (QUINAPRIL HCL) take 1 tab by mouth daily  #90 x 3   Entered by:   Angelena Sole MD   Authorized by:   [employer]undio Miu   Signed by:   Angelena Sole MD on 11/26/2009   Method used:   Faxed to ...       [facility] Department (retail)       [address] [place], [place]  [address]       Ph: [phone]       Fax: [phone]   RxID:   [id number]

## 2010-07-04 NOTE — Assessment & Plan Note (Signed)
Summary: routine visit/eo   Vital Signs:  Patient profile:   60 year old male Height:      63 inches Weight:      268.7 pounds BMI:     47.77 Temp:     99.2 degrees F oral Pulse rate:   65 / minute BP sitting:   147 / 72  (left arm) Cuff size:   large  Vitals Entered By: Garen Grams LPN (June 07, 2010 9:33 AM) CC: f/u dm Is Patient Diabetic? Yes Did you bring your meter with you today? No Pain Assessment Patient in pain? no        Primary Care Provider:  Angelena Sole MD  CC:  f/u dm.  History of Present Illness: 1. DMII: Pt is taking his medicines as prescribed.  He is checking his blood sugars regularly.  They are between 60-330.  Average  ~ 130.  He has not had any further hypoglycemic episodes.  Denies new numbness / weakness  2. HTN:  Pt is taking his medicines as prescribed.  He doesn't check his blood pressure at home regularly.  ROS: denies chest pain, shortness of breath  3. HLD:  Taking crestor as prescribed  4. Hx of stroke:  Ex-wife is concerned that he is dragging his right foot more.  He thinks that he is walking better.  He thinks that his strength is improved in his right arm and leg  Habits & Providers  Alcohol-Tobacco-Diet     Tobacco Status: never  Current Medications (verified): 1)  Hydrochlorothiazide 25 Mg  Tabs (Hydrochlorothiazide) .... Take 1 Tab By Mouth Every Morning 2)  Accupril 40 Mg Tabs (Quinapril Hcl) .... Take 1 Tab By Mouth Daily 3)  Crestor 5 Mg Tabs (Rosuvastatin Calcium) .... Take 1 Tab By Mouth Daily 4)  Plavix 75 Mg Tabs (Clopidogrel Bisulfate) .... One By Mouth Daily 5)  Multivitamins  Tabs (Multiple Vitamin) .... One By Mouth Daily 6)  Sertraline Hcl 100 Mg Tabs (Sertraline Hcl) .... One By Mouth Daily 7)  Baclofen 10 Mg Tabs (Baclofen) .... 1/2 Tab By Mouth Three Times A Day 8)  Lantus 100 Unit/ml Soln (Insulin Glargine) .... Inject 25 U in The Morning and 20 U in The Evening 9)  Novolog 100 Unit/ml Soln (Insulin  Aspart) .... Inject 15 U Sq Before Meals and 5 U Sq Before Snacks; Disp Qs X 3 Months 10)  Quad Cane/small Base  Misc (Misc. Devices) .... Use As Directed 11)  Sidekick Blood Glucose System  Devi (Blood Gluc Meter Disp-Strips) .... Use As Directed 12)  Blood Pressure Cuff  Misc (Misc. Devices) .... Use As Directed.  Pt. Needs A Large Cuff. 13)  Carvedilol 25 Mg Tabs (Carvedilol) .... Take 1 Tab By Mouth Twice A Day 14)  Monoject Insulin Syringe 28g X 1/2" 1 Ml Misc (Insulin Syringe-Needle U-100) .... Use As Directed 15)  Blood Glucose Test  Strp (Glucose Blood) .... Use As Directed 16)  Amlodipine Besylate 10 Mg Tabs (Amlodipine Besylate) .... Take 1 Tab By Mouth Daily  Allergies: No Known Drug Allergies  Past History:  Past Medical History: Reviewed history from 12/22/2008 and no changes required. 1. 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 Diabetes.  3. Hypertension.  4. Dyslipidemia.  5. Urinary tract infection.  6. Obesity.  7. diabetic retinopathy  Social History: Reviewed history from 04/09/2009 and no changes  required. Discharged from [facility] s/p CVA. Lives with his daughter who helps take care of him.  Has HHRN and HHPT. Family is close by and supportive. No alcohol, smoking, drugs. Daughter Va [place] [phone] (cell),  [phone] (home)  Physical Exam  General:  Vitals reviewed. alert and obese.  no acute distress Eyes:  vision grossly intact, pupils equal, and pupils round.   Lungs:  normal respiratory effort.  no crackles and no wheezes.   Heart:  normal rate and regular rhythm.   Extremities:  trace lower extremity edema Neurologic:  Neuro deficits unchanged, right sided hemiparesis.  Some movement of right arm and leg unchanged.  Gait unchanged.  Sensation unchanged.  Psych:  not anxious appearing and not depressed appearing.    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  No hypoglycemic episodes.  No changes today His updated medication list for this problem includes:    Accupril 40 Mg Tabs (Quinapril hcl) .Marland Kitchen... Take 1 tab by mouth daily    Lantus 100 Unit/ml Soln (Insulin glargine) ..... Inject 25 u in the morning and 20 u in the evening    Novolog 100 Unit/ml Soln (Insulin aspart) ..... Inject 15 u sq before meals and 5 u sq before snacks; disp qs x 3 months  Orders: A1C-[facility] (47425) [facility]- Est  Level 4 (95638)  Problem # 2:  HYPERTENSION, BENIGN SYSTEMIC (ICD-401.1) Assessment: Unchanged  Slightly elevated today.  Will continue to monitor.  If it remains elevated will need to adjust medications. His updated medication list for this problem includes:    Hydrochlorothiazide 25 Mg Tabs (Hydrochlorothiazide) .Marland Kitchen... Take 1 tab by mouth every morning    Accupril 40 Mg Tabs (Quinapril hcl) .Marland Kitchen... Take 1 tab by mouth daily    Carvedilol 25 Mg Tabs (Carvedilol) .Marland Kitchen... Take 1 tab by mouth twice a day    Amlodipine Besylate 10 Mg Tabs (Amlodipine besylate) .Marland Kitchen... Take 1 tab by mouth daily  Orders: [facility]- Est  Level 4 ([address]643)  Problem # 3:  DYSLIPIDEMIA (ICD-272.4) Assessment: Unchanged  Continue crestor His updated medication list for this problem includes:    Crestor 5 Mg Tabs (Rosuvastatin calcium) .Marland Kitchen... Take 1 tab by mouth daily  Orders: [facility]- Est  Level 4 (32951)  Problem # 4:  CEREBROVASCULAR ACCIDENT, HX OF (ICD-V12.50) Assessment: Unchanged  Neuro exam is unchanged.  He would likely benefit from continued PT.  He  doesn't get Medicare for another couple of months and they would like to hold off on PT referral until then.  Orders: [facility]- Est  Level 4 (88416)  Complete Medication List: 1)  Hydrochlorothiazide 25 Mg Tabs (Hydrochlorothiazide) .... Take 1 tab by mouth every morning 2)  Accupril 40 Mg Tabs (Quinapril hcl) .... Take 1 tab by mouth daily 3)  Crestor 5 Mg Tabs (Rosuvastatin  calcium) .... Take 1 tab by mouth daily 4)  Plavix 75 Mg Tabs (Clopidogrel bisulfate) .... One by mouth daily 5)  Multivitamins Tabs (Multiple vitamin) .... One by mouth daily 6)  Sertraline Hcl 100 Mg Tabs (Sertraline hcl) .... One by mouth daily 7)  Baclofen 10 Mg Tabs (Baclofen) .... 1/2 tab by mouth three times a day 8)  Lantus 100 Unit/ml Soln (Insulin glargine) .... Inject 25 u in the morning and 20 u in the evening 9)  Novolog 100 Unit/ml Soln (Insulin aspart) .... Inject 15 u sq before meals and 5 u sq before snacks; disp qs x 3 months 10)  Quad Cane/small Base Misc (Misc. devices) .... Use as directed 11)  Sidekick Blood Glucose System Devi (Blood gluc meter disp-strips) .... Use as directed 12)  Blood Pressure Cuff Misc (Misc. devices) .... Use as directed.  pt. needs a large cuff. 13)  Carvedilol 25 Mg Tabs (Carvedilol) .... Take 1 tab by mouth twice a day 14)  Monoject Insulin Syringe 28g X 1/2" 1 Ml Misc (Insulin syringe-needle u-100) .... Use as directed 15)  Blood Glucose Test Strp (Glucose blood) .... Use as directed 16)  Amlodipine Besylate 10 Mg Tabs (Amlodipine besylate) .... Take 1 tab by mouth daily   Orders Added: 1)  A1C-[facility] [83036] 2)  [facility]- Est  Level 4 [58099]    Laboratory Results   Blood Tests   Date/Time Received: June 07, 2010 9:23 AM  Date/Time Reported: June 07, 2010 9:44 AM   HGBA1C: 6.4%   (Normal Range: Non-Diabetic - 3-6%   Control Diabetic - 6-8%)  Comments: .......test performed by........Marland Kitchen Garen Grams, LPN entered by Terese Door, CMA

## 2010-08-19 LAB — GLUCOSE, CAPILLARY: Glucose-Capillary: 105 mg/dL — ABNORMAL HIGH (ref 70–99)

## 2010-08-20 ENCOUNTER — Ambulatory Visit (INDEPENDENT_AMBULATORY_CARE_PROVIDER_SITE_OTHER): Payer: Self-pay | Admitting: Family Medicine

## 2010-08-20 ENCOUNTER — Encounter: Payer: Self-pay | Admitting: Family Medicine

## 2010-08-20 DIAGNOSIS — E118 Type 2 diabetes mellitus with unspecified complications: Secondary | ICD-10-CM

## 2010-08-20 DIAGNOSIS — E785 Hyperlipidemia, unspecified: Secondary | ICD-10-CM

## 2010-08-20 DIAGNOSIS — I10 Essential (primary) hypertension: Secondary | ICD-10-CM

## 2010-08-20 DIAGNOSIS — E1165 Type 2 diabetes mellitus with hyperglycemia: Secondary | ICD-10-CM

## 2010-08-20 DIAGNOSIS — IMO0002 Reserved for concepts with insufficient information to code with codable children: Secondary | ICD-10-CM

## 2010-08-20 NOTE — Progress Notes (Signed)
  Subjective:    Patient ID: [name], male    DOB: May 09, 1951, 60 y.o.   MRN: [id number]  HPI 1. HTN:  Taking his medications as prescribed.  He thinks that he may be taking another medicine that starts with an M as well (?Metoprolol).  He doesn't check his blood pressure at home regularly.  2. DMII:  Taking his medications as prescribed.  His CBGs have been between 71-280 for the most part.  He is not due for an A1C today.  3. Dyslipidemia:  Taking the Crestor as prescribed.  Denies any muscle soreness or pain.  Review of Systems Denies chest pain, shortness of breath, new numbness / weakness    Objective:   Physical Exam  Constitutional: He appears well-nourished. No distress.       Overweight  Eyes: Conjunctivae and EOM are normal. Pupils are equal, round, and reactive to light.       Normal fundoscopic exam  Neck: Normal range of motion. Neck supple. No JVD present.  Cardiovascular: Normal rate, regular rhythm and normal heart sounds.   Pulmonary/Chest: Effort normal and breath sounds normal. No respiratory distress. He has no wheezes.  Abdominal: Soft.  Musculoskeletal: He exhibits no edema.  Neurological:       Right sided hemiparesis.  Able to walk but favors left side.  Psychiatric: He has a normal mood and affect.          Assessment & Plan:

## 2010-08-20 NOTE — Assessment & Plan Note (Signed)
CBGs acceptable today.  Not due for A1C today.  Will not make any changes today

## 2010-08-20 NOTE — Assessment & Plan Note (Signed)
Last LDL at goal.  He may have slightly increased benefit if LDL is <70.  Will recheck at next visit.

## 2010-08-20 NOTE — Patient Instructions (Signed)
We will keep an eye on your blood pressure. If it remains elevated at next visit we may need to adjust your medications Your blood sugar also has a couple elevated readings.  You are not due for the A1C test but we will check that next time Please schedule a follow up appointment in 3 months

## 2010-08-20 NOTE — Assessment & Plan Note (Signed)
BP slightly elevated at this visit.  He is taking his medications as directed.  His diastolic pressure was low.  Will not adjust medications at this time.  If it remains elevated at next visit will readdress.

## 2010-10-15 NOTE — Discharge Summary (Signed)
[name], [name]              ACCOUNT NO.:  [id number]   MEDICAL RECORD NO.:  [id number]          PATIENT TYPE:  INP   LOCATION:  3039                         FACILITY:  [facility]   PHYSICIAN:  Pramod P. Pearlean Brownie, MD    DATE OF BIRTH:  09-22-1950   DATE OF ADMISSION:  01/02/2008  DATE OF DISCHARGE:  01/11/2008                               DISCHARGE SUMMARY   DIAGNOSES AT TIME OF DISCHARGE:  1. 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  2. Diabetes.  3. Hypertension.  4. Dyslipidemia.  5. Urinary tract infection.  6. Obesity.   MEDICINES AT TIME OF DISCHARGE:  1. Hydrochlorothiazide 25 mg a day.  2. Vitamin C 500 mg a day.  3. Zinc 50 mg a day.  4. Lisinopril 40 mg a day.  5. Plavix 75 mg a day.  6. NPH insulin 25 units nightly and q. a.m.  7. NovoLog insulin 15 units t.i.d. at 8, 12 and 5 p.m.  8. Protonix 40 mg a day.  9. Zocor 20 mg a day.  10.Bactrim DS b.i.d. x3 days started August 9, end August 11 with p.m.      dose.   STUDIES PERFORMED:  CT of the brain on admission shows no acute  abnormalities.  MRI of the brain shows an 1 x 1.5 cm infarct involving  the left pons.  MRA of the brain small medium vessel atherosclerotic  changes bilaterally.  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  Cerebral angiogram  shows 80+ stenosis of right MCA and approximately 50% right ICA petrous  cavernous junction 50%.  Left middle cerebral artery and M1 segment.  Scattered arterial sclerotic changes in both posterior and anterior  circulation.  Chest x-ray shows cardiomegaly without failure.  No focal  air space disease.  2-D echocardiogram shows EF of 65-75% with no  obvious source of embolus.  Carotid Doppler shows no ICA stenosis.  EKG  not present in chart, though telemetry strip showed sinus rhythm.   LABORATORY STUDIES:  Urine culture showed greater than 100,000 colonies,  Morganella morganii.  Blood cultures negative x2.  CBC with white  blood  cells 19.3 neutrophils 81, lymphocytes 10, otherwise normal.  Hemoglobin  A1c 10.3.  Homocystine 7.2.  Cholesterol 152, triglycerides 77, HDL 33  and LDL 104.  Urine drug screen negative was.  Alcohol level less than  5.  Chemistry with glucose 254, otherwise normal.  Cardiac enzymes with  elevated CK-MB 11.5 and 12.5, negative troponin and coagulation studies  normal.   HISTORY OF PRESENT ILLNESS:  Mr. [name] is a 60 year old right-  handed African American male with past history of diabetes, hypertension  and noncompliance with medications.  He presented to the [facility]  emergency room with acute right-sided hemiparesis.  The exact onset of  his symptoms are unknown.  He got off work on January 02, 2008 a little  past midnight and went to bed without any deficits.  He woke up at 6  a.m. and noticed right-sided weakness involving his arm and legs.  He  laid in bed for 20 minutes and the weakness resolved.  He later got up  and got ready for work, but was not able to provide exact detail of  what he had done.  At 2 in the afternoon apparently he had fallen.  Though, the time during history taking kept changing.  He also stated he  called his daughter at 2:30 but the daughter actually stated she  received a call at 3:30.  He was felt not to be a TPA candidate  secondary to time of onset being unknown.  He was admitted to the  hospital for further stroke evaluation.   HOSPITAL COURSE:  Cerebral angiogram was performed by admitting  physician with no associated lesions in his posterior circulation.  He  ended up having a left pontine infarct as the etiology of his symptoms.  Infarct was found to be secondary to small-vessel disease and he was  placed on Plavix for secondary stroke prevention.  The patient has  vascular risk factors of hypertension, diabetes, dyslipidemia and  obesity.  His medications were continued to control his risk factors.  He was evaluated by PT and  OT and felt to benefit from rehab.  The  patient had no family to provide 24-hour care at home.  Decision was  made for skilled nursing facility placement.  Social worker searched for  a bed and once found, the patient was discharged there.  Of note he did  develop a urinary tract infection during hospital stay and was started  on Bactrim.   CONDITION ON DISCHARGE:  The patient alert and oriented x3.  No aphasia.  He had significant dysarthria.  His eye movements are full.  He has  dense right hematemesis.  His left movements were normal.  His heart  rate is regular.  Breath sounds are clear.   DISCHARGE/PLAN:  1. Discharge to skilled nursing facility for continuation of PT/OT and      speech therapy.  2. Plavix for secondary stroke prevention.  3. Ongoing risk factor control by primary care physician.  4. Follow up with Dr. Delia Heady from in his office in 2 to 3      months.      Annie Main, N.P.       Pramod P. Pearlean Brownie, MD     SB/MEDQ  D:  01/11/2008  T:  01/11/2008  Job:  [id number]   cc:   Pramod P. Pearlean Brownie, MD

## 2010-10-15 NOTE — H&P (Signed)
[name], [name] NO.:  [id number]   MEDICAL RECORD NO.:  [id number]          PATIENT TYPE:  INP   LOCATION:  3112                         FACILITY:  [facility]   PHYSICIAN:  Levert Feinstein, MD          DATE OF BIRTH:  10-27-1950   DATE OF ADMISSION:  01/02/2008  DATE OF DISCHARGE:                              HISTORY & PHYSICAL   CHIEF COMPLAINT:  Stroke.   HISTORY OF PRESENT ILLNESS:  The patient is a 60 year old right-handed  African American male with past medical history of diabetes, on insulin;  hypertension; noncompliance with medication;  presenting with acute  right hemiparesis.   The patient is not acurate on exact time of his symptom onset.  He got  off work at January 02, 2008, a little past midnight, went to bed fine.  Wake up on January 02, 2008, 6 o'clock in the morning, noticed right side  weakness, involving arm and leg, he laying in bed for 20 more minutes,  the weakness resolved.  He later got up, and get ready for his work,  but he was not able to provide exact detail what he has done.  By 2  o'clock, apparently he had fell, when he looked up at the clock, it was  about 2 p.m.  The patient changed the timelines during the history  telling, he told me that it happened at 2:30 initially and then went  back to 2 o'clock.  In addition, he reported he called his daughter at  2:30, but his daughter actually received the call at 3:30.   This time, he has dense right hemiparesis, dysarthria, stayed the same  since his onset.  He denies sensory change, vision change, headache,  chest pain, or heart palpitation.   PAST MEDICAL HISTORY:  1. Hypertension.  2. Diabetes.   SURGICAL HISTORY:  None.   MEDICATIONS:  Noncompliant.  He is on multiple over-the-counter  supplement vitamin C, zinc, Stress Energy.  Supposed to take  hydrochlorothiazide 20 mg every morning, but he was not compliant with  his medications, I suppose, unknown dose of insulin.   ALLERGIES:  No known drug allergies.   FAMILY HISTORY:  Significant for diabetes, heart attack, and  hypertension.   SOCIAL HISTORY:  He works as a Electrical engineer.  Denies smoke or drink.  Has 3 children.   PHYSICAL EXAMINATION:  VITAL SIGNS:  He is afebrile 98.6, blood pressure  maximum is 205/110, and heart rate of 90s.  GENERAL:  He is drowsy, but follow commands, has profound dysarthria,  but no aphasia.  CARDIAC:  Regular rate and rhythm.  PULMONARY:  Clear to auscultation bilaterally.  NECK:  Supple.  No carotid bruits.  NEUROLOGIC:  He has profound dysarthria, follow commands, cranial nerves  II through XII, pupils equal, round, and reactive to light.  Extraocular  movements were full.  There was no gaze preference.  Right fundi were  sharp.  Visual fields were full on confrontational test, he has right  lower face weakness, and uvula and tongue midline.  Head turning,  shoulder shrugging  were normal, and symmetric.  MOTOR:  Normal tone, dense right hemiparesis involving upper and lower  extremity 0/5.  Left upper and lower extremity motor strength is normal.  Sensory was intact to light touch, pinprick.  There was no extinction on  double spontaneous stimulation.  Deep tendon reflex was normal and  symmetric and plantar responses were flexor.  Coordination:  No  dysmetria on the left upper and lower extremity.  Gait was deferred.  NIH stroke scale is 12.   CT of the brain without contrast revealed there was a left occipital  arachnoid cyst, but there was no acute lesion.   LABORATORY DATA EVALUATION:  WBC elevated 15.3 with 88% neutrophils.  INR 1.0.  CMP was pending.   ASSESSMENT AND PLAN:  A 60 year old diabetic, hypertension, noncompliant  with medication, presenting with hypertensive, right hemiparesis,  dysarthria, localized dilation to the left brain, internal capsule  versus subcortical.  He is at the 4:30 hours IV t-PA window lines.  Because he was not able to  provide exact time of his symptoms onset, I  would defer IV t-PA.  I have called Dr. Corliss Skains, and he answered back  at 6:35 p.m., for potential angiogram and IA t-PA.  Admits to ICU  telemetry bed afterwards, echocardiogram, MRI of the brain, and the lab  evaluation.      Levert Feinstein, MD  Electronically Signed     YY/MEDQ  D:  01/02/2008  T:  01/03/2008  Job:  [id number]

## 2010-10-29 ENCOUNTER — Telehealth: Payer: Self-pay | Admitting: *Deleted

## 2010-10-29 MED ORDER — CARVEDILOL 25 MG PO TABS: 25.0000 mg | ORAL_TABLET | Freq: Two times a day (BID) | ORAL | Status: DC

## 2010-11-01 MED ORDER — CARVEDILOL 25 MG PO TABS: 25.0000 mg | ORAL_TABLET | Freq: Two times a day (BID) | ORAL | Status: DC

## 2010-11-01 NOTE — Telephone Encounter (Signed)
Addended by: Jonnie Kind on: 11/01/2010 01:31 PM   Modules accepted: Orders

## 2010-11-04 ENCOUNTER — Other Ambulatory Visit: Payer: Self-pay | Admitting: Family Medicine

## 2010-11-04 MED ORDER — CARVEDILOL 25 MG PO TABS: 25.0000 mg | ORAL_TABLET | Freq: Two times a day (BID) | ORAL | Status: DC

## 2010-12-06 ENCOUNTER — Telehealth: Payer: Self-pay | Admitting: Family Medicine

## 2010-12-06 ENCOUNTER — Other Ambulatory Visit: Payer: Self-pay | Admitting: Family Medicine

## 2010-12-06 DIAGNOSIS — IMO0002 Reserved for concepts with insufficient information to code with codable children: Secondary | ICD-10-CM

## 2010-12-06 DIAGNOSIS — E1165 Type 2 diabetes mellitus with hyperglycemia: Secondary | ICD-10-CM

## 2010-12-06 DIAGNOSIS — E118 Type 2 diabetes mellitus with unspecified complications: Secondary | ICD-10-CM

## 2010-12-06 MED ORDER — INSULIN GLARGINE 100 UNIT/ML ~~LOC~~ SOLN: SUBCUTANEOUS | Status: DC

## 2010-12-06 NOTE — Telephone Encounter (Signed)
Has been called to serve jury duty in August, but is not able to this time due to his medical condition and needs a letter stating the reasons.

## 2010-12-06 NOTE — Telephone Encounter (Signed)
Med refilled with no additional refills Pt due for diabetes f/u. Will need an appointment for additional refills.

## 2010-12-06 NOTE — Assessment & Plan Note (Signed)
Will refill Lantus for one month.   Pt due for repeat A1c.  Will need to be seen.

## 2010-12-09 ENCOUNTER — Telehealth: Payer: Self-pay | Admitting: Family Medicine

## 2010-12-09 DIAGNOSIS — IMO0002 Reserved for concepts with insufficient information to code with codable children: Secondary | ICD-10-CM

## 2010-12-09 DIAGNOSIS — E1165 Type 2 diabetes mellitus with hyperglycemia: Secondary | ICD-10-CM

## 2010-12-09 MED ORDER — INSULIN ASPART 100 UNIT/ML ~~LOC~~ SOLN: 15.0000 [IU] | Freq: Three times a day (TID) | SUBCUTANEOUS | Status: DC

## 2010-12-09 MED ORDER — INSULIN GLARGINE 100 UNIT/ML ~~LOC~~ SOLN: SUBCUTANEOUS | Status: DC

## 2010-12-09 NOTE — Telephone Encounter (Signed)
Refilled.  Will call and leave message.

## 2010-12-09 NOTE — Telephone Encounter (Signed)
Calling back, is very concerned about getting this asap due to pts diabetes, says to leave a message on her vm since she is at work.

## 2010-12-09 NOTE — Telephone Encounter (Signed)
Has 1 day left on his Lantus & Novalog (he had been getting this from the company and now they have stopped) [facility] [address]

## 2010-12-10 ENCOUNTER — Encounter: Payer: Self-pay | Admitting: Family Medicine

## 2010-12-10 NOTE — Telephone Encounter (Signed)
Letter written and sent to patient,

## 2010-12-24 ENCOUNTER — Telehealth: Payer: Self-pay | Admitting: Family Medicine

## 2010-12-24 NOTE — Telephone Encounter (Signed)
[name] daughter has noticed some things in her dad that are concerning and would like to speak to someone.  One example is that he will start talking about things from a different conversation that happened several days previous like he went back in time.  She would like some kind of memory test done, but she doesn't want him to know so he can't prepare himself, she wants it to be very impromptu but doesn't know if that is possible.  She is at work and says to leave a message and she will call back as soon as she can.

## 2010-12-24 NOTE — Telephone Encounter (Signed)
LMOVM for dgt to call back and schedule an appt.  They have not met the new MD Rydell Wiegel, Maryjo Rochester

## 2011-01-10 ENCOUNTER — Ambulatory Visit (INDEPENDENT_AMBULATORY_CARE_PROVIDER_SITE_OTHER): Payer: Medicare Other | Admitting: Family Medicine

## 2011-01-10 VITALS — BP 122/73 | HR 73 | Temp 98.2°F | Wt 259.0 lb

## 2011-01-10 DIAGNOSIS — I10 Essential (primary) hypertension: Secondary | ICD-10-CM

## 2011-01-10 DIAGNOSIS — IMO0002 Reserved for concepts with insufficient information to code with codable children: Secondary | ICD-10-CM

## 2011-01-10 DIAGNOSIS — Z8679 Personal history of other diseases of the circulatory system: Secondary | ICD-10-CM

## 2011-01-10 DIAGNOSIS — E1165 Type 2 diabetes mellitus with hyperglycemia: Secondary | ICD-10-CM

## 2011-01-10 MED ORDER — INSULIN GLARGINE 100 UNIT/ML ~~LOC~~ SOLN: SUBCUTANEOUS | Status: DC

## 2011-01-10 NOTE — Patient Instructions (Signed)
Thank you for coming in today. For your tooth ache please use tylenol as needed. Please call the dentist provided on the list. For your R leg swelling: elevate your leg, you may also wear a compression hose. Please f/u in 4 mos.  -Dr. Armen Pickup

## 2011-01-10 NOTE — Progress Notes (Signed)
  Subjective:    Patient ID: [name], male    DOB: [birth year], 60 y.o.   MRN: [id number]  HPI Pt here accompanied by daughter to meet new MD and discuss multiple medical problems. After agenda setting, addressed.   1. Memory: per pt's daughter, his memory has suffered since his stoke in 8/09. He also has R sided weakness. Pt denies impairments. He does work or drive. He does note leave home often. Daughter denies outburst in mood. Per his daughter, he forgets what he eats throughout the day. He is not forgetting people. He has not gotten loss.   2. Diabetes: Pt taking insulin. He occassionally forgets to take his Lantus at night. He does check his CBGs. He brought in his meter. CBGs: 42-302, average 96-200. He denies changes in vision, CP, SOB, N/V, fainting.   3. Obesity: Pt is not physically active. He does not do much during the day. His weight has been stable. His daughter buys his groceries. She tries to limit his access to carb rich foods and buy whole wheat foods   4. HTN: BP well controlled. Pt compliant with mediations. Pt does not check BPs at home. Pt denies CP, SOB. Pt denies syncope and dizziness.   Reviewed and updated: PMHX and Social Hx   Review of Systems As per HPI    Objective:   Physical Exam  Nursing note and vitals reviewed. Constitutional: He is oriented to person, place, and time. He appears well-developed and well-nourished. No distress.  HENT:  Head: Normocephalic and atraumatic.  Cardiovascular: Normal rate, regular rhythm and normal heart sounds.   Pulmonary/Chest: Effort normal and breath sounds normal.  Neurological: He is alert and oriented to person, place, and time. No cranial nerve deficit.       Mental status exam pt scores: 28/30. Pt struggled with spelling world backwards, otherwise no deficits.   Skin:       Feet: 1+ DP pulses, no rashes or ulcers. No maceration between nails. Toes normal color.           Assessment & Plan:

## 2011-01-11 LAB — BASIC METABOLIC PANEL
BUN: 23 mg/dL (ref 6–23)
CO2: 24 mEq/L (ref 19–32)
Chloride: 105 mEq/L (ref 96–112)
Creat: 1.08 mg/dL (ref 0.50–1.35)

## 2011-01-13 ENCOUNTER — Telehealth: Payer: Self-pay | Admitting: Family Medicine

## 2011-01-13 MED ORDER — ROSUVASTATIN CALCIUM 5 MG PO TABS: 5.0000 mg | ORAL_TABLET | Freq: Every day | ORAL | Status: DC

## 2011-01-13 MED ORDER — AMLODIPINE BESYLATE 10 MG PO TABS: 10.0000 mg | ORAL_TABLET | Freq: Every day | ORAL | Status: DC

## 2011-01-13 MED ORDER — CLOPIDOGREL BISULFATE 75 MG PO TABS: 75.0000 mg | ORAL_TABLET | Freq: Every day | ORAL | Status: DC

## 2011-01-13 NOTE — Telephone Encounter (Signed)
Plavix, Crestor and Norvasc were supposed to be refilled, but none of these were sent to the Pharmacy when he was here on Friday.  He is completely out and cannot wait.

## 2011-01-14 MED ORDER — HYDROCHLOROTHIAZIDE 25 MG PO TABS: 25.0000 mg | ORAL_TABLET | Freq: Every day | ORAL | Status: DC

## 2011-01-14 NOTE — Assessment & Plan Note (Signed)
Imp: BP well controlled at this visit. Medications: pt compliant without significant side effects Plan: discussed increased physical activity to promote weight loss and improved cardiac function. No medication changes.

## 2011-01-14 NOTE — Assessment & Plan Note (Addendum)
Imp: Stable R sided deficits. Reassuring MMSE. Pt at risk for vascular dementia. Plan: f/u MMSE regularly.

## 2011-01-14 NOTE — Assessment & Plan Note (Signed)
Imp: moderately controlled. Slight elevation in A1c.  Medications: pt compliant. Some lows.  Plan: switch lantus to once daily, 30 U q AM in an attempt to improve compliance and avoid hypoglycemic events.

## 2011-02-14 ENCOUNTER — Other Ambulatory Visit: Payer: Self-pay | Admitting: Family Medicine

## 2011-02-14 NOTE — Telephone Encounter (Signed)
Refill request

## 2011-02-18 ENCOUNTER — Telehealth: Payer: Self-pay | Admitting: Family Medicine

## 2011-02-18 DIAGNOSIS — E1165 Type 2 diabetes mellitus with hyperglycemia: Secondary | ICD-10-CM

## 2011-02-18 DIAGNOSIS — IMO0002 Reserved for concepts with insufficient information to code with codable children: Secondary | ICD-10-CM

## 2011-02-18 MED ORDER — INSULIN GLARGINE 100 UNIT/ML ~~LOC~~ SOLN: SUBCUTANEOUS | Status: DC

## 2011-02-18 NOTE — Telephone Encounter (Signed)
Refill request

## 2011-02-18 NOTE — Telephone Encounter (Signed)
[name] is totally out of lantus.  Pharmacy sent request on Friday.

## 2011-02-18 NOTE — Telephone Encounter (Signed)
Refill done.  

## 2011-02-28 LAB — CBC
HCT: 42.2
HCT: 42.8
Hemoglobin: 14.4
MCHC: 33.3
MCHC: 33.5
MCV: 89.1
MCV: 90.2
Platelets: 169
Platelets: 179
RBC: 4.68
RBC: 4.81
RDW: 12.2
WBC: 15.3 — ABNORMAL HIGH

## 2011-02-28 LAB — URINE MICROSCOPIC-ADD ON

## 2011-02-28 LAB — URINALYSIS, ROUTINE W REFLEX MICROSCOPIC
Bilirubin Urine: NEGATIVE
Glucose, UA: NEGATIVE
Glucose, UA: NEGATIVE
Hgb urine dipstick: NEGATIVE
Ketones, ur: NEGATIVE
Protein, ur: 100 — AB
Protein, ur: 100 — AB
pH: 6.5

## 2011-02-28 LAB — CULTURE, BLOOD (ROUTINE X 2)

## 2011-02-28 LAB — RAPID URINE DRUG SCREEN, HOSP PERFORMED
Barbiturates: NOT DETECTED
Benzodiazepines: NOT DETECTED
Cocaine: NOT DETECTED

## 2011-02-28 LAB — COMPREHENSIVE METABOLIC PANEL
BUN: 17
CO2: 20
Calcium: 8.6
Creatinine, Ser: 1.08
GFR calc non Af Amer: >60
Glucose, Bld: 254 — ABNORMAL HIGH

## 2011-02-28 LAB — DIFFERENTIAL
Basophils Relative: 0
Eosinophils Absolute: 0
Eosinophils Absolute: 0.2
Eosinophils Relative: 1
Lymphocytes Relative: 7 — ABNORMAL LOW
Lymphs Abs: 1.1
Monocytes Relative: 7
Neutrophils Relative %: 81 — ABNORMAL HIGH
Neutrophils Relative %: 88 — ABNORMAL HIGH

## 2011-02-28 LAB — PROTIME-INR
INR: 1
Prothrombin Time: 12.8

## 2011-02-28 LAB — CK TOTAL AND CKMB (NOT AT ARMC): Total CK: 849 — ABNORMAL HIGH

## 2011-02-28 LAB — URINE CULTURE: Special Requests: NEGATIVE

## 2011-02-28 LAB — CARDIAC PANEL(CRET KIN+CKTOT+MB+TROPI)
CK, MB: 11.3 — ABNORMAL HIGH
Relative Index: 1.2
Troponin I: 0.04

## 2011-02-28 LAB — LIPID PANEL
Total CHOL/HDL Ratio: 4.6
VLDL: 15

## 2011-02-28 LAB — ETHANOL: Alcohol, Ethyl (B): <5

## 2011-03-09 ENCOUNTER — Other Ambulatory Visit: Payer: Self-pay | Admitting: Family Medicine

## 2011-03-10 NOTE — Telephone Encounter (Signed)
Refill request

## 2011-04-07 ENCOUNTER — Other Ambulatory Visit: Payer: Self-pay | Admitting: Family Medicine

## 2011-04-07 NOTE — Telephone Encounter (Signed)
Refill request

## 2011-04-12 ENCOUNTER — Other Ambulatory Visit: Payer: Self-pay | Admitting: Family Medicine

## 2011-04-14 NOTE — Telephone Encounter (Signed)
Refill request

## 2011-04-15 ENCOUNTER — Other Ambulatory Visit: Payer: Self-pay | Admitting: Family Medicine

## 2011-04-15 MED ORDER — CARVEDILOL 25 MG PO TABS: 25.0000 mg | ORAL_TABLET | Freq: Two times a day (BID) | ORAL | Status: DC

## 2011-06-13 ENCOUNTER — Telehealth: Payer: Self-pay | Admitting: Family Medicine

## 2011-06-13 NOTE — Telephone Encounter (Signed)
Mr. [name] daughter is calling to find out if it is ok for him to take Zinc and Magnesium.  Please call his daughter back, she would prefer today before the sale goes off.

## 2011-06-13 NOTE — Telephone Encounter (Signed)
Called daughter back. Advised that it would not be needed. Recommended avoiding unnecessary supplements.

## 2011-06-23 ENCOUNTER — Telehealth: Payer: Self-pay | Admitting: Family Medicine

## 2011-06-23 NOTE — Telephone Encounter (Signed)
Pt needs [employer] to contact [facility] about Crestor rx. Pt wanted statement faxed. Given to Dr. Armen Pickup.

## 2011-06-25 MED ORDER — PRAVASTATIN SODIUM 20 MG PO TABS: 20.0000 mg | ORAL_TABLET | Freq: Every day | ORAL | Status: DC

## 2011-06-25 NOTE — Telephone Encounter (Signed)
Called pt at home. Informed him that I would change him to something that was on the [facility] formulary.  Called [facility] pravastatin is on formulary. Changed to pravastatin.

## 2011-07-09 ENCOUNTER — Telehealth: Payer: Self-pay | Admitting: Family Medicine

## 2011-07-09 DIAGNOSIS — E1165 Type 2 diabetes mellitus with hyperglycemia: Secondary | ICD-10-CM

## 2011-07-09 DIAGNOSIS — IMO0002 Reserved for concepts with insufficient information to code with codable children: Secondary | ICD-10-CM

## 2011-07-09 DIAGNOSIS — E118 Type 2 diabetes mellitus with unspecified complications: Secondary | ICD-10-CM

## 2011-07-09 NOTE — Telephone Encounter (Signed)
Daughter is calling about her fathers appt and refills.  She is having problems getting him to the MD to get his refills until the end of the month.  She is hoping for at least 1 month of refills on his meds.

## 2011-07-10 ENCOUNTER — Other Ambulatory Visit: Payer: Self-pay | Admitting: Family Medicine

## 2011-07-10 ENCOUNTER — Telehealth: Payer: Self-pay | Admitting: *Deleted

## 2011-07-10 MED ORDER — AMLODIPINE BESYLATE 10 MG PO TABS: 10.0000 mg | ORAL_TABLET | Freq: Every day | ORAL | Status: DC

## 2011-07-10 MED ORDER — MULTIVITAMINS PO TABS: 1.0000 | ORAL_TABLET | Freq: Every day | ORAL | Status: DC

## 2011-07-10 MED ORDER — INSULIN ASPART 100 UNIT/ML ~~LOC~~ SOLN: 15.0000 [IU] | Freq: Three times a day (TID) | SUBCUTANEOUS | Status: DC

## 2011-07-10 MED ORDER — HYDROCHLOROTHIAZIDE 25 MG PO TABS: 25.0000 mg | ORAL_TABLET | Freq: Every day | ORAL | Status: DC

## 2011-07-10 MED ORDER — CLOPIDOGREL BISULFATE 75 MG PO TABS: 75.0000 mg | ORAL_TABLET | Freq: Every day | ORAL | Status: DC

## 2011-07-10 MED ORDER — ROSUVASTATIN CALCIUM 5 MG PO TABS: 5.0000 mg | ORAL_TABLET | Freq: Every day | ORAL | Status: DC

## 2011-07-10 MED ORDER — SERTRALINE HCL 100 MG PO TABS: 100.0000 mg | ORAL_TABLET | Freq: Every day | ORAL | Status: DC

## 2011-07-10 MED ORDER — QUINAPRIL HCL 40 MG PO TABS: 40.0000 mg | ORAL_TABLET | Freq: Every day | ORAL | Status: DC

## 2011-07-10 MED ORDER — LISINOPRIL 40 MG PO TABS: 40.0000 mg | ORAL_TABLET | Freq: Every day | ORAL | Status: DC

## 2011-07-10 MED ORDER — PRAVASTATIN SODIUM 20 MG PO TABS: 20.0000 mg | ORAL_TABLET | Freq: Every day | ORAL | Status: DC

## 2011-07-10 MED ORDER — BACLOFEN 10 MG PO TABS: 5.0000 mg | ORAL_TABLET | Freq: Three times a day (TID) | ORAL | Status: DC

## 2011-07-10 MED ORDER — INSULIN GLARGINE 100 UNIT/ML ~~LOC~~ SOLN: SUBCUTANEOUS | Status: DC

## 2011-07-10 NOTE — Telephone Encounter (Signed)
Received message on voicemail from [place] [facility].  Rx sent today for quinipril and lisinopril.  Verifying if he needs to be on both meds since they are in the same class.  Will route note to Dr. Armen Pickup and call back.  Gaylene Brooks, RN

## 2011-07-10 NOTE — Telephone Encounter (Signed)
Called back. Patient should be just on lisinopril and not Accupril. Changed in med rec.

## 2011-07-10 NOTE — Telephone Encounter (Signed)
[facility] for this [facility].

## 2011-07-10 NOTE — Telephone Encounter (Signed)
Refills done. Called daughter who will call tomorrow or Friday to schedule dad's appointment.

## 2011-08-09 ENCOUNTER — Other Ambulatory Visit: Payer: Self-pay | Admitting: Family Medicine

## 2011-08-10 NOTE — Telephone Encounter (Signed)
Refill request

## 2011-09-10 ENCOUNTER — Encounter: Payer: Self-pay | Admitting: Family Medicine

## 2011-09-10 ENCOUNTER — Ambulatory Visit (INDEPENDENT_AMBULATORY_CARE_PROVIDER_SITE_OTHER): Payer: Medicare Other | Admitting: Family Medicine

## 2011-09-10 VITALS — BP 114/52 | HR 60 | Temp 98.9°F | Ht 63.0 in | Wt 260.1 lb

## 2011-09-10 DIAGNOSIS — B351 Tinea unguium: Secondary | ICD-10-CM

## 2011-09-10 DIAGNOSIS — I10 Essential (primary) hypertension: Secondary | ICD-10-CM

## 2011-09-10 DIAGNOSIS — IMO0002 Reserved for concepts with insufficient information to code with codable children: Secondary | ICD-10-CM

## 2011-09-10 DIAGNOSIS — E1165 Type 2 diabetes mellitus with hyperglycemia: Secondary | ICD-10-CM

## 2011-09-10 DIAGNOSIS — E669 Obesity, unspecified: Secondary | ICD-10-CM

## 2011-09-10 DIAGNOSIS — E118 Type 2 diabetes mellitus with unspecified complications: Secondary | ICD-10-CM

## 2011-09-10 LAB — LDL CHOLESTEROL, DIRECT: Direct LDL: 78 mg/dL

## 2011-09-10 LAB — COMPREHENSIVE METABOLIC PANEL
ALT: 11 U/L (ref 0–53)
AST: 14 U/L (ref 0–37)
Albumin: 4.2 g/dL (ref 3.5–5.2)
CO2: 29 mEq/L (ref 19–32)
Calcium: 9 mg/dL (ref 8.4–10.5)
Chloride: 104 mEq/L (ref 96–112)
Potassium: 4.1 mEq/L (ref 3.5–5.3)
Total Protein: 6.9 g/dL (ref 6.0–8.3)

## 2011-09-10 LAB — POCT GLYCOSYLATED HEMOGLOBIN (HGB A1C): Hemoglobin A1C: 7.1

## 2011-09-10 MED ORDER — TERBINAFINE HCL 1 % EX CREA: TOPICAL_CREAM | Freq: Two times a day (BID) | CUTANEOUS | Status: AC

## 2011-09-10 NOTE — Patient Instructions (Addendum)
Mr. [name],   Thank you for coming in to see me today.  Please keep your f/u with Dr. Mitzi Davenport.  Please use the new lamisil cream for your toenail fungus. You may soak and file the big toe on the L foot down. Please do not clip your nails or files the skin.  I am sending you to a podiatrist for routine nail care.   Dr. Armen Pickup

## 2011-09-11 ENCOUNTER — Encounter: Payer: Self-pay | Admitting: Family Medicine

## 2011-09-11 MED ORDER — HYDROCHLOROTHIAZIDE 25 MG PO TABS: 25.0000 mg | ORAL_TABLET | Freq: Every day | ORAL | Status: DC

## 2011-09-11 MED ORDER — CARVEDILOL 25 MG PO TABS: 25.0000 mg | ORAL_TABLET | Freq: Two times a day (BID) | ORAL | Status: DC

## 2011-09-11 MED ORDER — LISINOPRIL 40 MG PO TABS: 40.0000 mg | ORAL_TABLET | Freq: Every day | ORAL | Status: DC

## 2011-09-11 MED ORDER — INSULIN ASPART 100 UNIT/ML ~~LOC~~ SOLN: 15.0000 [IU] | Freq: Three times a day (TID) | SUBCUTANEOUS | Status: DC

## 2011-09-11 MED ORDER — SERTRALINE HCL 100 MG PO TABS: 100.0000 mg | ORAL_TABLET | Freq: Every day | ORAL | Status: DC

## 2011-09-11 MED ORDER — AMLODIPINE BESYLATE 10 MG PO TABS: 10.0000 mg | ORAL_TABLET | Freq: Every day | ORAL | Status: DC

## 2011-09-11 MED ORDER — CLOPIDOGREL BISULFATE 75 MG PO TABS: 75.0000 mg | ORAL_TABLET | Freq: Every day | ORAL | Status: DC

## 2011-09-11 MED ORDER — INSULIN GLARGINE 100 UNIT/ML ~~LOC~~ SOLN: SUBCUTANEOUS | Status: DC

## 2011-09-11 NOTE — Progress Notes (Signed)
Patient ID: [name], male   DOB: 1950/09/23, 61 y.o.   MRN: [id number]  Subjective:   HPI Pt here accompanied by son-in-law to follow-up on the following:   1. Diabetes: Pt taking lantus q Am as prescribed. He is also taking novolog TID. He does check his CBGs. He brought in his meter. CBGs: 62-211. He denies changes in vision, CP, SOB, N/V, syncope and tingling or numbness in extremities. His last diabetic eye exam was last June done by Dr. Mitzi Davenport. He is to f/u yearly.    2.  Obesity: Pt is not physically active. He does not do much during the day. His weight has been stable.   3 . HTN: BP well controlled. Pt compliant with mediations. Pt does not check BPs at home. Pt denies CP, SOB. Pt denies syncope and dizziness.   4. Thickened toenails: Pt has diffusely thick and yellow toenails. He L great toe is particularly thick. He does not clip his toe nails. He wear slippers in the home which has thick but not hard soles.   Review of Systems As per HPI    Objective:   Physical Exam  Nursing note and vitals reviewed. Constitutional: He is oriented to person, place, and time. He appears well-developed and well-nourished. No distress.  HENT:  Head: Normocephalic and atraumatic.  Cardiovascular: Normal rate, regular rhythm and normal heart sounds.   Pulmonary/Chest: Effort normal and breath sounds normal.  Neurological: He is alert and oriented to person, place, and time.  Skin:       Feet: non-palpable DP pulses, +1 PT pulses, no rashes or ulcers. Maceration between nails. Brisk capillary refill. Toenails thick, yellow and long.     Clipped toenails in the office.         Assessment & Plan:

## 2011-09-11 NOTE — Assessment & Plan Note (Signed)
Lamisil topical. Will not due Lamisil by mouth given multiple medical problems and patient taking multiple mediations.  Referral to podiatry for assistance with nail and foot care.

## 2011-09-11 NOTE — Assessment & Plan Note (Signed)
A: well controlled.  Lab Results  Component Value Date   HGBA1C 7.1 09/10/2011  Compliant with Lantus with no episode of symptomatic hypoglycemia.   P: Continue current regimen.  Diabetic foot exam done. Patient has scheduled f/u with Dr. Mitzi Davenport for yearly eye exam. Referral to podiatry for toenail care.

## 2011-09-11 NOTE — Assessment & Plan Note (Signed)
BP well controlled. Compliant with medications without significant side effects.  Electrolytes within normal limits. Plan: Refill medications.

## 2011-09-11 NOTE — Assessment & Plan Note (Signed)
Weight stable. Continue BP and DM management. Encouraged physical activity.

## 2011-10-07 ENCOUNTER — Other Ambulatory Visit: Payer: Self-pay | Admitting: *Deleted

## 2011-10-07 DIAGNOSIS — I70219 Atherosclerosis of native arteries of extremities with intermittent claudication, unspecified extremity: Secondary | ICD-10-CM

## 2011-10-10 ENCOUNTER — Encounter (INDEPENDENT_AMBULATORY_CARE_PROVIDER_SITE_OTHER): Payer: Medicare Other

## 2011-10-10 DIAGNOSIS — E1159 Type 2 diabetes mellitus with other circulatory complications: Secondary | ICD-10-CM

## 2011-10-10 DIAGNOSIS — I70219 Atherosclerosis of native arteries of extremities with intermittent claudication, unspecified extremity: Secondary | ICD-10-CM

## 2011-10-10 DIAGNOSIS — I739 Peripheral vascular disease, unspecified: Secondary | ICD-10-CM

## 2011-10-18 ENCOUNTER — Other Ambulatory Visit: Payer: Self-pay | Admitting: Family Medicine

## 2011-10-20 ENCOUNTER — Ambulatory Visit (INDEPENDENT_AMBULATORY_CARE_PROVIDER_SITE_OTHER): Payer: Medicare Other | Admitting: Cardiovascular Disease

## 2011-10-20 ENCOUNTER — Encounter: Payer: Self-pay | Admitting: Cardiovascular Disease

## 2011-10-20 VITALS — BP 140/76 | HR 79 | Resp 18 | Ht 67.0 in | Wt 267.8 lb

## 2011-10-20 DIAGNOSIS — I739 Peripheral vascular disease, unspecified: Secondary | ICD-10-CM

## 2011-10-20 DIAGNOSIS — I779 Disorder of arteries and arterioles, unspecified: Secondary | ICD-10-CM

## 2011-10-20 LAB — BASIC METABOLIC PANEL
BUN: 23 mg/dL (ref 6–23)
Chloride: 106 mEq/L (ref 96–112)
Creatinine, Ser: 1.1 mg/dL (ref 0.4–1.5)
Glucose, Bld: 205 mg/dL — ABNORMAL HIGH (ref 70–99)
Potassium: 4.3 mEq/L (ref 3.5–5.1)

## 2011-10-20 NOTE — Progress Notes (Signed)
History of Present Illness: 61 yo male with history of HTN, DM who is referred today for PV evaluation. He was recently evaluated in family medicine clinic and found to have poor distal pulses. ABI severely reduced at 0.25 on the right and 1.2 on the left with suggestion. He has also been followed by Dr. Leeanne Deed in the podiatry office for foot and nail care.   He tells me that his legs are not painful. He is somewhat limited by his right sided weakness following his CVA. No rest pain or ulcerations in lower extremities. No other complaints today.   Primary Care Physician: [facility])  Past Medical History  Diagnosis Date  . Hypertension   . Diabetes mellitus   . CVA (cerebral infarction) 2009    Past Surgical History  Procedure Date  . None     Current Outpatient Prescriptions  Medication Sig Dispense Refill  . amLODipine (NORVASC) 10 MG tablet Take 1 tablet (10 mg total) by mouth daily.  90 tablet  3  . Blood Gluc Meter Disp-Strips (SIDEKICK BLOOD GLUCOSE SYSTEM) DEVI by Does not apply route.        . Blood Pressure Monitoring (BLOOD PRESSURE CUFF) MISC by Does not apply route. Pt needs a large cuff       . carvedilol (COREG) 25 MG tablet Take 1 tablet (25 mg total) by mouth 2 (two) times daily with a meal.  180 tablet  3  . clopidogrel (PLAVIX) 75 MG tablet Take 1 tablet (75 mg total) by mouth daily.  90 tablet  3  . hydrochlorothiazide (HYDRODIURIL) 25 MG tablet Take 1 tablet (25 mg total) by mouth daily.  90 tablet  3  . INS SYRINGE/NEEDLE 1CC/28G (MONOJECT INS SYR 1CC/28G) 28G X 1/2" 1 ML MISC by Does not apply route.        . insulin aspart (NOVOLOG) 100 UNIT/ML injection Inject 15 Units into the skin 3 (three) times daily before meals.  10 mL  11  . insulin glargine (LANTUS) 100 UNIT/ML injection 30 units in the morning  10 mL  11  . lisinopril (PRINIVIL,ZESTRIL) 40 MG tablet Take 1 tablet (40 mg total) by mouth daily.  90 tablet  3  . Misc. Devices (QUAD CANE/SMALL  BASE) MISC by Does not apply route.        . multivitamin (THERAGRAN) per tablet Take 1 tablet by mouth daily.  90 tablet  0  . pravastatin (PRAVACHOL) 20 MG tablet Take 1 tablet (20 mg total) by mouth daily.  90 tablet  0  . sertraline (ZOLOFT) 100 MG tablet Take 1 tablet (100 mg total) by mouth daily.  90 tablet  3  . terbinafine (LAMISIL AT) 1 % cream Apply topically 2 (two) times daily.  30 g  0    No Known Allergies  History   Social History  . Marital Status: Married    Spouse Name: N/A    Number of Children: 3  . Years of Education: N/A   Occupational History  . Disabled-security guard    Social History Main Topics  . Smoking status: Never Smoker   . Smokeless tobacco: Not on file  . Alcohol Use: No  . Drug Use: No  . Sexually Active: Not on file   Other Topics Concern  . Not on file   Social History Narrative  . No narrative on file    Family History  Problem Relation Age of Onset  . Diabetes Mother   .  Heart attack Mother     Review of Systems:  As stated in the HPI and otherwise negative.   BP 140/76  Pulse 79  Resp 18  Ht 5\' 7"  (1.702 m)  Wt 267 lb 12.8 oz (121.473 kg)  BMI 41.94 kg/m2  Physical Examination: General: Well developed, well nourished, NAD HEENT: OP clear, mucus membranes moist SKIN: warm, dry. No rashes. Neuro: No focal deficits Musculoskeletal: Muscle strength 5/5 all ext Psychiatric: Mood and affect normal Neck: No JVD, no carotid bruits, no thyromegaly, no lymphadenopathy. Lungs:Clear bilaterally, no wheezes, rhonci, crackles Cardiovascular: Regular rate and rhythm. No murmurs, gallops or rubs. Abdomen:Soft. Bowel sounds present. Non-tender.  Extremities: 1+ right  lower extremity edema. Trace left lower extremity edema.  Pulses are non-palpable bilateral  DP/PT.  EKG:  Sinus rhythm, rate 62 bpm. PACs.

## 2011-10-20 NOTE — Assessment & Plan Note (Addendum)
He has evidence of PAD on his LE dopplers. ABI is severely reduced in the right lower ext. He has no pain in his legs but he does have some neurological deficits on the right side following his CVA in 2009. No ulcerations or skin breakdown. Will arrange CTA abd/pelvis/bilateral Lower extremities to further define his disease. Will plan further workup after CTA. Will get BMET today.

## 2011-10-20 NOTE — Patient Instructions (Addendum)
Your physician recommends that you schedule a follow-up appointment in: 3-4 weeks.   Non-Cardiac CT Angiography (CTA), is a special type of CT scan that uses a computer to produce multi-dimensional views of major blood vessels throughout the body. In CT angiography, a contrast material is injected through an IV to help visualize the blood vessels  Do not eat or drink for 4 hours prior to this test.  Do not take your insulin that morning if you do not eat breakfast

## 2011-10-22 ENCOUNTER — Other Ambulatory Visit: Payer: Self-pay | Admitting: Family Medicine

## 2011-10-22 ENCOUNTER — Other Ambulatory Visit: Payer: Self-pay | Admitting: *Deleted

## 2011-10-22 MED ORDER — BACLOFEN 10 MG PO TABS: 5.0000 mg | ORAL_TABLET | Freq: Three times a day (TID) | ORAL | Status: DC | PRN

## 2011-10-22 MED ORDER — BACLOFEN 10 MG PO TABS
5.0000 mg | ORAL_TABLET | Freq: Three times a day (TID) | ORAL | Status: DC | PRN
Start: 2011-10-22 — End: 2011-10-22

## 2011-10-22 MED ORDER — PRAVASTATIN SODIUM 20 MG PO TABS: 20.0000 mg | ORAL_TABLET | Freq: Every day | ORAL | Status: DC

## 2011-10-22 NOTE — Telephone Encounter (Signed)
Also requesting refill on Baclofen 10mg , but I don't see that on his medication list.  Ileana Ladd

## 2011-10-23 ENCOUNTER — Ambulatory Visit (INDEPENDENT_AMBULATORY_CARE_PROVIDER_SITE_OTHER)
Admission: RE | Admit: 2011-10-23 | Discharge: 2011-10-23 | Disposition: A | Discharge status: Home / Self Care | Payer: Medicare Other | Source: Ambulatory Visit | Attending: Cardiovascular Disease | Admitting: Cardiovascular Disease

## 2011-10-23 DIAGNOSIS — I739 Peripheral vascular disease, unspecified: Secondary | ICD-10-CM

## 2011-10-23 DIAGNOSIS — I779 Disorder of arteries and arterioles, unspecified: Secondary | ICD-10-CM

## 2011-10-23 MED ORDER — IOHEXOL 350 MG/ML SOLN
120.0000 mL | Freq: Once | INTRAVENOUS | Status: AC | PRN
  Administered 2011-10-23: 120 mL via INTRAVENOUS

## 2011-10-24 ENCOUNTER — Telehealth: Payer: Self-pay | Admitting: Cardiovascular Disease

## 2011-10-24 DIAGNOSIS — I779 Disorder of arteries and arterioles, unspecified: Secondary | ICD-10-CM

## 2011-10-24 NOTE — Telephone Encounter (Signed)
Advised report did not see to be reviewed by MD, will forward to [facility] as well

## 2011-10-24 NOTE — Telephone Encounter (Signed)
Please return call to brenda Vallance regarding the results of patient 5/23 test [phone].

## 2011-10-28 NOTE — Telephone Encounter (Signed)
F/u   Patietn wife [name] calling for status on test results, she can be reached at [phone].

## 2011-10-28 NOTE — Telephone Encounter (Signed)
Pt notified. cdm

## 2011-10-29 NOTE — Telephone Encounter (Signed)
Addended by: Dossie Arbour on: 10/29/2011 05:29 PM   Modules accepted: Orders

## 2011-10-29 NOTE — Telephone Encounter (Signed)
Spoke with wife and told her Dr. Clifton James would like to refer pt to [facility] for opinion on LE disease. Wife agreeable with this plan. Referral ordered. Will fax CT report and office visit to [facility]

## 2011-11-18 ENCOUNTER — Ambulatory Visit: Payer: Medicare Other | Admitting: Cardiovascular Disease

## 2011-12-09 ENCOUNTER — Encounter: Payer: Self-pay | Admitting: Vascular Surgery

## 2011-12-10 ENCOUNTER — Encounter: Payer: Self-pay | Admitting: Vascular Surgery

## 2011-12-10 ENCOUNTER — Encounter (INDEPENDENT_AMBULATORY_CARE_PROVIDER_SITE_OTHER): Payer: Medicare Other | Admitting: *Deleted

## 2011-12-10 ENCOUNTER — Ambulatory Visit (INDEPENDENT_AMBULATORY_CARE_PROVIDER_SITE_OTHER): Payer: Medicare Other | Admitting: Vascular Surgery

## 2011-12-10 VITALS — BP 163/91 | HR 68 | Temp 98.7°F | Ht 67.0 in | Wt 263.0 lb

## 2011-12-10 DIAGNOSIS — M7989 Other specified soft tissue disorders: Secondary | ICD-10-CM

## 2011-12-10 DIAGNOSIS — I7092 Chronic total occlusion of artery of the extremities: Secondary | ICD-10-CM | POA: Insufficient documentation

## 2011-12-10 DIAGNOSIS — I739 Peripheral vascular disease, unspecified: Secondary | ICD-10-CM

## 2011-12-10 DIAGNOSIS — Z48812 Encounter for surgical aftercare following surgery on the circulatory system: Secondary | ICD-10-CM

## 2011-12-10 NOTE — Progress Notes (Signed)
[facility]ist of [facility]  Patient name: [name] MRN: [id number] DOB: [birth year] Sex: male  REASON FOR CONSULT: peripheral vascular disease. Referred by Dr. Clifton James.  HPI: [name] is a 61 y.o. male who had ABIs done by his podiatrist. This showed an ABI of 25% on the right and 100% on the left. He was seen by Dr. Clifton James and a CT angiogram was ordered. This showed no significant aortoiliac occlusive disease. However, he had diffuse infrainguinal arterial occlusive disease. He was sent for vascular consultation.  He has had a previous stroke associated with right sided weakness. He is ambulatory but his activity is very limited. He does not describe any symptoms consistent with claudication. He describes pain in both legs when he is standing. He denies rest pain. In addition he states that he can elevate his legs without any rest pain. He has had no nonhealing wounds.  He has developed some recent right lower extremity swelling. He is unaware of any previous history of DVT.  Past Medical History  Diagnosis Date  . Hypertension   . Diabetes mellitus   . CVA (cerebral infarction) 2009  . Stroke   . DVT (deep venous thrombosis)     Family History  Problem Relation Age of Onset  . Diabetes Mother   . Heart attack Mother   . Hypertension Mother   . Diabetes Sister   . Hypertension Sister   . Hypertension Brother   . Hypertension Daughter   . Hypertension Son     SOCIAL HISTORY: History  Substance Use Topics  . Smoking status: Never Smoker   . Smokeless tobacco: Never Used  . Alcohol Use: No    No Known Allergies  Current Outpatient Prescriptions  Medication Sig Dispense Refill  . amLODipine (NORVASC) 10 MG tablet Take 1 tablet (10 mg total) by mouth daily.  90 tablet  3  . baclofen (LIORESAL) 10 MG tablet Take 0.5 tablets (5 mg total) by mouth 3 (three) times daily as needed (muscle spasm).  135 tablet  0  . Blood Gluc Meter Disp-Strips  (SIDEKICK BLOOD GLUCOSE SYSTEM) DEVI by Does not apply route.        . Blood Pressure Monitoring (BLOOD PRESSURE CUFF) MISC by Does not apply route. Pt needs a large cuff       . carvedilol (COREG) 25 MG tablet Take 1 tablet (25 mg total) by mouth 2 (two) times daily with a meal.  180 tablet  3  . clopidogrel (PLAVIX) 75 MG tablet Take 1 tablet (75 mg total) by mouth daily.  90 tablet  3  . hydrochlorothiazide (HYDRODIURIL) 25 MG tablet Take 1 tablet (25 mg total) by mouth daily.  90 tablet  3  . INS SYRINGE/NEEDLE 1CC/28G (MONOJECT INS SYR 1CC/28G) 28G X 1/2" 1 ML MISC by Does not apply route.        . insulin aspart (NOVOLOG) 100 UNIT/ML injection Inject 15 Units into the skin 3 (three) times daily before meals.  10 mL  11  . insulin glargine (LANTUS) 100 UNIT/ML injection 30 units in the morning  10 mL  11  . lisinopril (PRINIVIL,ZESTRIL) 40 MG tablet Take 1 tablet (40 mg total) by mouth daily.  90 tablet  3  . Misc. Devices (QUAD CANE/SMALL BASE) MISC by Does not apply route.        . multivitamin (THERAGRAN) per tablet Take 1 tablet by mouth daily.  90 tablet  0  . pravastatin (PRAVACHOL) 20  MG tablet Take 1 tablet (20 mg total) by mouth daily.  90 tablet  0  . sertraline (ZOLOFT) 100 MG tablet Take 1 tablet (100 mg total) by mouth daily.  90 tablet  3  . terbinafine (LAMISIL AT) 1 % cream Apply topically 2 (two) times daily.  30 g  0    REVIEW OF SYSTEMS: [name] ] denotes positive finding; [  ] denotes negative finding  CARDIOVASCULAR:  [ ]  chest pain   [ ]  chest pressure   [ ]  palpitations   [ ]  orthopnea   [ ]  dyspnea on exertion   [ ]  claudication   [ ]  rest pain   [ ]  DVT   [ ]  phlebitis PULMONARY:   [ ]  productive cough   [ ]  asthma   [ ]  wheezing NEUROLOGIC:   [name] ] weakness right upper extremity and lower extremity. [name] ] paresthesias right upper extremity and lower extremity  [name] ] he has had some mild expressive aphasia.  [ ]  amaurosis  [ ]  dizziness HEMATOLOGIC:   [ ]  bleeding problems    [ ]  clotting disorders MUSCULOSKELETAL:  [ ]  joint pain   [ ]  joint swelling [name] ] leg swelling- right leg GASTROINTESTINAL: [ ]   blood in stool  [ ]   hematemesis GENITOURINARY:  [ ]   dysuria  [ ]   hematuria PSYCHIATRIC:  [name] ] history of major depression- according to the family. INTEGUMENTARY:  [ ]  rashes  [ ]  ulcers CONSTITUTIONAL:  [ ]  fever   [ ]  chills  PHYSICAL EXAM: Filed Vitals:   12/10/11 0913  BP: 163/91  Pulse: 68  Temp: 98.7 F (37.1 C)  TempSrc: Oral  Height: 5\' 7"  (1.702 m)  Weight: 263 lb (119.296 kg)  SpO2: 100%   Body mass index is 41.19 kg/(m^2). GENERAL: The patient is a well-nourished male, in no acute distress. The vital signs are documented above. CARDIOVASCULAR: There is a regular rate and rhythm without significant murmur appreciated. I do not detect carotid bruits. He has palpable femoral pulses. I cannot palpate popliteal or pedal pulses. He has a fairly brisk anterior tibial and dorsalis pedis signal on the right with the Doppler. It is difficult to assess for a posterior tibial signal because of the leg swelling on the right. He has a brisk posterior tibial signal on the left leg. He has no ischemic ulcers. He has significant right lower extremity swelling. PULMONARY: There is good air exchange bilaterally without wheezing or rales. ABDOMEN: Soft and non-tender with normal pitched bowel sounds. His abdomen is obese and it is difficult to assess for an aneurysm although he did not have an aneurysm by CT scan. MUSCULOSKELETAL: There are no major deformities or cyanosis. NEUROLOGIC: he has significant right upper extremity and right lower extremity swelling.Marland Kitchen SKIN: There are no ulcers or rashes noted. PSYCHIATRIC: The patient has a normal affect.  DATA:   Lab Results  Component Value Date   CREATININE 1.1 10/20/2011   Lab Results  Component Value Date   HGBA1C 7.1 09/10/2011   I have reviewed his records from the referring physician. He is noted to have  some paresthesias in the right leg related to his stroke but likely not related to his peripheral vascular disease.  I have independently interpreted his CT angiogram shows diffuse calcific disease and bilateral infrainguinal arterial occlusive disease.  His ABIs showed an ABI of 25% right percent on the left.  Given his significant right lower extremity swelling we  did obtain a venous duplex scan today in our office. I have independently interpreted this. This shows no evidence of DVT in the right lower extremity. He does have some evidence of venous valvular incompetence in the deep and superficial veins on the right.  MEDICAL ISSUES:  PAD (peripheral artery disease) This patient has evidence of diffuse infrainguinal arterial occlusive disease bilaterally. He has significantly calcified arteries. However I do hear fairly reasonable Doppler flow in both feet and he has no open ulcers. He describes no significant claudication and has no history of rest pain. In addition, when he elevates his right leg because of his right leg swelling this does not cause significant pain suggesting the circulation is adequate. Is not a smoker. His risk factors are followed closely by his primary care physician. At this point I would not recommend arteriography or consideration for revascularization. I'll plan on seeing him back in 6 months. He knows to call sooner if he has problems. I have ordered follow up ABIs for that time.   With respect to the patient's right lower extremity swelling, he has no evidence of acute DVT in the right. He does have evidence of chronic venous insufficiency. We have discussed the importance of intermittent leg elevation.   DICKSON,CHRISTOPHER S [facility] [place] [facility]: [phone]

## 2011-12-10 NOTE — Assessment & Plan Note (Signed)
This patient has evidence of diffuse infrainguinal arterial occlusive disease bilaterally. He has significantly calcified arteries. However I do hear fairly reasonable Doppler flow in both feet and he has no open ulcers. He describes no significant claudication and has no history of rest pain. In addition, when he elevates his right leg because of his right leg swelling this does not cause significant pain suggesting the circulation is adequate. Is not a smoker. His risk factors are followed closely by his primary care physician. At this point I would not recommend arteriography or consideration for revascularization. I'll plan on seeing him back in 6 months. He knows to call sooner if he has problems. I have ordered follow up ABIs for that time.

## 2011-12-17 NOTE — Procedures (Unsigned)
DUPLEX DEEP VENOUS EXAM - LOWER EXTREMITY  INDICATION:  Right lower extremity swelling.  HISTORY:  Edema:  Yes Trauma/Surgery:  No Pain:  No PE:  No Previous DVT:  No Anticoagulants:  Plavix Other:  DUPLEX EXAM:               CFV   SFV   PopV  PTV    GSV               R  L  R  L  R  L  R   L  R  L Thrombosis    o  o  o     o     o      o Spontaneous   +  +  +     +     +      + Phasic        +  +  +     +     +      + Augmentation  +  +  +     +     +      + Compressible  +  +  +     +     +      + Competent     o     o     o            o  Legend:  + - yes  o - no  p - partial  D - decreased  IMPRESSION: 1. No evidence of acute DVT in the right lower extremity. 2. Distal superficial femoral vein and calf veins are difficult to     visualize due to body habitus and arterial calcific shadowing;     cannot rule out nonocclusive chronic DVT in these areas. 3. Deep and superficial veins are not competent.    _____________________________ Di Kindle. Edilia Bo, M.D.  LT/MEDQ  D:  12/10/2011  T:  12/10/2011  Job:  [id number]

## 2012-01-08 ENCOUNTER — Other Ambulatory Visit: Payer: Self-pay | Admitting: Family Medicine

## 2012-02-17 ENCOUNTER — Other Ambulatory Visit: Payer: Self-pay | Admitting: Family Medicine

## 2012-03-05 ENCOUNTER — Telehealth: Payer: Self-pay | Admitting: Family Medicine

## 2012-03-05 NOTE — Telephone Encounter (Signed)
[name] daughter [name],left form for completion regarding trash collection for persons with disabilities.

## 2012-03-09 ENCOUNTER — Encounter: Payer: Self-pay | Admitting: Family Medicine

## 2012-03-09 NOTE — Telephone Encounter (Signed)
Form signed and placed on donna's desk.

## 2012-03-10 NOTE — Telephone Encounter (Signed)
Left message for Sophona at [phone] that form for Mr. Beier is completed and ready to be picked up at front desk.  Kathrine Cords, Nori Riis

## 2012-05-14 ENCOUNTER — Other Ambulatory Visit: Payer: Self-pay | Admitting: Family Medicine

## 2012-06-04 ENCOUNTER — Encounter: Payer: Self-pay | Admitting: Family Medicine

## 2012-06-04 ENCOUNTER — Ambulatory Visit (INDEPENDENT_AMBULATORY_CARE_PROVIDER_SITE_OTHER): Payer: Medicare Other | Admitting: Family Medicine

## 2012-06-04 VITALS — BP 122/70 | HR 78 | Temp 98.8°F | Ht 67.0 in | Wt 275.4 lb

## 2012-06-04 DIAGNOSIS — E1165 Type 2 diabetes mellitus with hyperglycemia: Secondary | ICD-10-CM

## 2012-06-04 DIAGNOSIS — IMO0002 Reserved for concepts with insufficient information to code with codable children: Secondary | ICD-10-CM

## 2012-06-04 DIAGNOSIS — B351 Tinea unguium: Secondary | ICD-10-CM

## 2012-06-04 DIAGNOSIS — Z8679 Personal history of other diseases of the circulatory system: Secondary | ICD-10-CM

## 2012-06-04 DIAGNOSIS — I10 Essential (primary) hypertension: Secondary | ICD-10-CM

## 2012-06-04 DIAGNOSIS — E785 Hyperlipidemia, unspecified: Secondary | ICD-10-CM

## 2012-06-04 DIAGNOSIS — Z23 Encounter for immunization: Secondary | ICD-10-CM

## 2012-06-04 DIAGNOSIS — E118 Type 2 diabetes mellitus with unspecified complications: Secondary | ICD-10-CM

## 2012-06-04 LAB — COMPREHENSIVE METABOLIC PANEL
ALT: 9 U/L (ref 0–53)
AST: 13 U/L (ref 0–37)
Albumin: 3.8 g/dL (ref 3.5–5.2)
Alkaline Phosphatase: 34 U/L — ABNORMAL LOW (ref 39–117)
Glucose, Bld: 164 mg/dL — ABNORMAL HIGH (ref 70–99)
Potassium: 4.2 mEq/L (ref 3.5–5.3)
Sodium: 136 mEq/L (ref 135–145)
Total Bilirubin: 0.4 mg/dL (ref 0.3–1.2)
Total Protein: 6.7 g/dL (ref 6.0–8.3)

## 2012-06-04 LAB — LIPID PANEL
LDL Cholesterol: 90 mg/dL (ref 0–99)
Total CHOL/HDL Ratio: 3.6 Ratio
VLDL: 10 mg/dL (ref 0–40)

## 2012-06-04 LAB — POCT GLYCOSYLATED HEMOGLOBIN (HGB A1C): Hemoglobin A1C: 6.8

## 2012-06-04 MED ORDER — INSULIN ASPART 100 UNIT/ML ~~LOC~~ SOLN: 12.0000 [IU] | Freq: Two times a day (BID) | SUBCUTANEOUS | Status: DC

## 2012-06-04 MED ORDER — ASPIRIN 81 MG PO TABS: 81.0000 mg | ORAL_TABLET | Freq: Every day | ORAL | Status: DC

## 2012-06-04 MED ORDER — INSULIN ASPART 100 UNIT/ML ~~LOC~~ SOLN: 10.0000 [IU] | Freq: Two times a day (BID) | SUBCUTANEOUS | Status: DC

## 2012-06-04 NOTE — Patient Instructions (Addendum)
Mr. Cuny,  Thank you for coming in today.  Regarding diabetes: A1c down to 6.8! I have decreased novolog to 12 units twice daily from 15 twice daily.  Please see Dr. Mitzi Davenport for your yearly eye exam.  Please see your podiatrist for nail care and restart lamisil.   For high cholesterol: checking lipid panel today.  For high blood pressure: rechecked BP is great! 122/70. You can stop hydrochlorothiazide.   For spasms: wean baclofen as tolerated. I will keep the dose the same.   Flu shot today.   F/u in 3 months   Dr. Armen Pickup

## 2012-06-07 NOTE — Assessment & Plan Note (Signed)
A: BP at goal. Patient on HCTZ and Ace for BP control, 2/2 of CVA and DM2.  Weighing risk and benefit will d/c thiazide diuretic which would afford tighter glycemic control and ability to continue to back off on insulin. P:  D/c HCTZ Encouraged patient to explore exercise options.

## 2012-06-07 NOTE — Progress Notes (Signed)
Subjective:     Patient ID: [name], male   DOB: [birth year], 62 y.o.   MRN: [id number]  HPI 62 yo M with history R sided stroke and residual weakness  presents with his daughter for routine  f/u visit: He has no complaints. He denies urgent care or ED visit since last office visit.   # DM2: patient compliant with insulin he takes novolog twice daily because he only eats two meals. He check her CBG range 57-221. He feels bad with low CBGs and drink juice to bring up his blood sugar. He denies vision changes, HA, CP, SOB and tingling/numbness in his feet. Overdue for diabetic eye exam and podiatrist exam for foot care.   #HTN: compliant with medications. Would like to get off of some of his BP meds if able. Not exercising.   # HLD: compliant with statin   # PAD: evaluated by vascular surgery. Found to have moderate PAD.   Review of Systems As per HPI    Objective:   Physical Exam BP 122/70  Pulse 78  Temp 98.8 F (37.1 C) (Oral)  Ht 5\' 7"  (1.702 m)  Wt 275 lb 6.4 oz (124.921 kg)  BMI 43.13 kg/m2 General appearance: alert, cooperative and no distress Throat: lips, mucosa, and tongue normal; teeth and gums normal Neck: no adenopathy, no carotid bruit, no JVD, supple, symmetrical, trachea midline and thyroid not enlarged, symmetric, no tenderness/mass/nodules Lungs: clear to auscultation bilaterally Heart: regular rate and rhythm, S1, S2 normal, no murmur, click, rub or gallop Extremities: edema trace bilateral > on R.  Pulses: 2+ and symmetric Skin: Dry skin on feet.  Neurologic: CN normal. R sided weak.     Assessment and Plan:

## 2012-06-07 NOTE — Assessment & Plan Note (Signed)
Assessment: A1c down to 6.8. Low CBGs recorded on monitor. Plan:  I have decreased novolog to 12 units twice daily from 15 twice daily.  Please see Dr. Mitzi Davenport for your yearly eye exam.  Please see your podiatrist for nail care and restart lamisil.

## 2012-06-07 NOTE — Assessment & Plan Note (Signed)
A: persistent P: patient encouraged to use lamisil and f/u with podiatry.

## 2012-06-07 NOTE — Assessment & Plan Note (Signed)
A: compliant with statin P: check fasting lipid panel

## 2012-06-07 NOTE — Assessment & Plan Note (Addendum)
A: BP at goal. P: D/C HCTZ.

## 2012-06-10 ENCOUNTER — Encounter: Payer: Self-pay | Admitting: Family Medicine

## 2012-06-15 ENCOUNTER — Encounter: Payer: Self-pay | Admitting: Neurosurgery

## 2012-06-16 ENCOUNTER — Ambulatory Visit: Payer: Medicare Other | Admitting: Vascular Surgery

## 2012-06-16 ENCOUNTER — Ambulatory Visit: Payer: Medicare Other | Admitting: Neurosurgery

## 2012-06-25 ENCOUNTER — Telehealth: Payer: Self-pay | Admitting: Family Medicine

## 2012-06-25 NOTE — Telephone Encounter (Signed)
Received letter from [employer] that lantus will no longer be covered. Patient was provided with a 30 day supply on 06/13/2012. Options: levemir, novolin and novolog.  Patient already on novolog.  Has history of low CBGs.  Proposed plan  1. Start 70/30 BID 2. Decrease novolog to prn mealtime sliding scale based on blood sugar.s  Asked patient to call to discuss change.  Will wait to changes meds until I have a chance to speak with the patient.

## 2012-07-06 ENCOUNTER — Telehealth: Payer: Self-pay | Admitting: Family Medicine

## 2012-07-06 NOTE — Telephone Encounter (Signed)
Lantus requires  PA form placed in MD box.

## 2012-07-06 NOTE — Telephone Encounter (Signed)
Patient is calling requesting to speak to the Diabetic nurse about his insurance company wanting to change his medication.

## 2012-07-07 ENCOUNTER — Telehealth: Payer: Self-pay | Admitting: Family Medicine

## 2012-07-07 NOTE — Telephone Encounter (Signed)
Called patient to do the following: 1. Schedule home visit for tomorrow at 10 AM.  2. Let him know that I received that his lantus will no longer be covered and discuss changing to a different insulin regimen. patient received the same letter. Will discuss tomorrow at the home visit.

## 2012-07-07 NOTE — Telephone Encounter (Signed)
PA required for Lantus. Form placed in MD box.

## 2012-07-08 ENCOUNTER — Ambulatory Visit: Payer: Medicare Other | Admitting: Family Medicine

## 2012-07-08 VITALS — BP 176/84 | HR 60

## 2012-07-08 DIAGNOSIS — E1165 Type 2 diabetes mellitus with hyperglycemia: Secondary | ICD-10-CM

## 2012-07-08 DIAGNOSIS — I10 Essential (primary) hypertension: Secondary | ICD-10-CM

## 2012-07-08 DIAGNOSIS — IMO0002 Reserved for concepts with insufficient information to code with codable children: Secondary | ICD-10-CM

## 2012-07-08 DIAGNOSIS — E118 Type 2 diabetes mellitus with unspecified complications: Secondary | ICD-10-CM

## 2012-07-08 MED ORDER — INSULIN DETEMIR 100 UNIT/ML ~~LOC~~ SOLN: 30.0000 [IU] | Freq: Every day | SUBCUTANEOUS | Status: DC

## 2012-07-08 NOTE — Assessment & Plan Note (Signed)
A: BP elevated this visit.  P: Patient due for f/u office visit in 2 months.  If BP still elevated at f/u I will restart HCTZ at 12.5 mg daily.

## 2012-07-08 NOTE — Assessment & Plan Note (Signed)
A: CBGs at goal. Last A1c at goal. Meds: compliant P:  Switch from lantus to levemir due to [employer] formulary change.

## 2012-07-08 NOTE — Progress Notes (Signed)
Subjective:     Patient ID: [name], male   DOB: 24-Apr-1951, 62 y.o.   MRN: [id number]  HPI 62 yo M with R hemiparetic stroke  seen at a home visit to assess level of function, home health needs and diabetes management.   1. Level of function:  Patient walks unassisted at home. Walk with a cane in his L hand when out.  Does not drive. Prepares his own food and manages his own medications.  He has a shower chair. He has an ankle brace for his R foot but does not wear it usually.  He lives alone. He has two sons and one daughter who check in on him often and provide transportation.   2. Diabetes: compliant with insulin. Able to recall regimen. Checks blood sugars.  CBGs [id number], most readings between 120-158. He has an appointment with podiatry tomorrow afternoon.   Review of Systems reports occasional L hand numbness.     Objective:   Physical Exam BP 176/84  Pulse 60 General appearance: alert, cooperative and no distress Extremities:  L hand: palpable pulse. Reverse phalen test negative.  Feet: long and thick toe nails.  Gait: favor L side, foot drop on R.   Lab Results  Component Value Date   HGBA1C 6.8 06/04/2012         Assessment and Plan:

## 2012-07-08 NOTE — Telephone Encounter (Signed)
Changed from lantus to levemir.

## 2012-07-28 ENCOUNTER — Ambulatory Visit: Payer: Medicare Other | Admitting: Neurosurgery

## 2012-08-13 ENCOUNTER — Other Ambulatory Visit: Payer: Self-pay | Admitting: Family Medicine

## 2012-09-08 ENCOUNTER — Other Ambulatory Visit: Payer: Self-pay | Admitting: Family Medicine

## 2012-09-08 NOTE — Telephone Encounter (Signed)
Daughter dropped off form to be filled out for [employer].  Please fax when completed to [phone] and let daughter know that it was done.  Phone number [phone].

## 2012-09-10 NOTE — Telephone Encounter (Signed)
[employer] forms completed and faxed to [phone].  [name]

## 2012-09-14 ENCOUNTER — Encounter: Payer: Self-pay | Admitting: Family Medicine

## 2012-10-11 ENCOUNTER — Telehealth: Payer: Self-pay | Admitting: Family Medicine

## 2012-10-11 MED ORDER — "INSULIN SYRINGE-NEEDLE U-100 28G X 1/2"" 1 ML MISC": 1.0000 | Freq: Three times a day (TID) | Status: DC

## 2012-10-11 NOTE — Telephone Encounter (Signed)
Patient needs refill on large needles for insulin. Using [facility] on [address]

## 2012-10-11 NOTE — Telephone Encounter (Signed)
Will forward to Dr. Funches 

## 2012-10-11 NOTE — Telephone Encounter (Signed)
Box of 100 needles sent in.

## 2012-11-18 ENCOUNTER — Telehealth: Payer: Self-pay | Admitting: Family Medicine

## 2012-11-18 NOTE — Telephone Encounter (Signed)
Patient calls wanting to know if he is able to take medications for erectile dysfunction.

## 2012-11-18 NOTE — Telephone Encounter (Signed)
Will fwd to MD.  Linsey Arteaga L, CMA  

## 2012-11-19 MED ORDER — SILDENAFIL CITRATE 25 MG PO TABS: 25.0000 mg | ORAL_TABLET | Freq: Every day | ORAL | Status: DC | PRN

## 2012-11-19 NOTE — Telephone Encounter (Signed)
Call patient later noted he can take medication for his erectile dysfunction. I described Viagra 25 mg 1-2 tabs as needed. I form the patient the major side effect Viagra as that can lower blood pressure especially if his combined with nitroglycerin. I informed patient if he ever has an episode of chest pain requiring emergency response t he was informed responded he periodically takes Viagra.

## 2012-11-22 ENCOUNTER — Telehealth: Payer: Self-pay | Admitting: *Deleted

## 2012-11-22 ENCOUNTER — Telehealth: Payer: Self-pay | Admitting: Family Medicine

## 2012-11-22 MED ORDER — SILDENAFIL CITRATE 20 MG PO TABS: 40.0000 mg | ORAL_TABLET | Freq: Every day | ORAL | Status: DC | PRN

## 2012-11-22 NOTE — Telephone Encounter (Signed)
Called patient after received message from [employer] that viagra is not covered. Patient informed me that they would cover 20 mg generic dose.  Sent in 20 mg generic dose.

## 2012-11-22 NOTE — Telephone Encounter (Signed)
Received a call from [place] at [facility] that they needed clarification on the diagnosis code associated with his Rivatio rx.  Spoke with Nichelle at [place] [phone] and she informed me that they were unable to give prior authorization for this medication.  Due to him being seen by a pulmonologist or a cardiologist.  She will send it for further review and we will receive a fax with a decision in 2-3 days.  States that there will be a list of criteria that will need to be met if not approved.

## 2012-11-23 ENCOUNTER — Telehealth: Payer: Self-pay | Admitting: *Deleted

## 2012-11-23 NOTE — Telephone Encounter (Signed)
Made in error

## 2012-11-23 NOTE — Telephone Encounter (Signed)
Received call from [place] at [place].  Pt has medicare part D and generic viagra is not covered.  She can be reached [phone].

## 2012-11-23 NOTE — Telephone Encounter (Signed)
Received fax that Sildenafill is denied - will place form in your box if you would like to send in again. Wyatt Haste, RN-BSN

## 2012-11-24 NOTE — Telephone Encounter (Signed)
Called patient. [name] will not cover sildenadil for ED.  I informed him that the best placed to obtain the medication is [facility],  90 pills $120. I gave him the phone number [phone].

## 2012-12-04 ENCOUNTER — Other Ambulatory Visit: Payer: Self-pay | Admitting: Family Medicine

## 2012-12-05 NOTE — Telephone Encounter (Signed)
Refilling pravastatin. Will need fasting lipid panel and LFT check 06/2013. LFTs checked 06/2012 and WNL.

## 2012-12-09 ENCOUNTER — Other Ambulatory Visit: Payer: Self-pay

## 2012-12-31 ENCOUNTER — Other Ambulatory Visit: Payer: Self-pay | Admitting: *Deleted

## 2013-01-01 MED ORDER — BACLOFEN 10 MG PO TABS: ORAL_TABLET | ORAL | Status: DC

## 2013-01-01 NOTE — Telephone Encounter (Signed)
Refilling baclofen, but needs MD appt prior to more refills in the future. Please let patient know. Thank you.

## 2013-01-03 NOTE — Telephone Encounter (Signed)
Spoke with pt.  He will have his daughter call and make an appt. Floreine Kingdon, Maryjo Rochester

## 2013-01-26 ENCOUNTER — Other Ambulatory Visit: Payer: Self-pay | Admitting: *Deleted

## 2013-01-28 MED ORDER — CLOPIDOGREL BISULFATE 75 MG PO TABS: ORAL_TABLET | ORAL | Status: DC

## 2013-01-28 NOTE — Telephone Encounter (Signed)
Plavix marked as "taking" at 07/2012 office visit (most recent). Will refill but request on prescription that pt see MD for follow up.

## 2013-05-03 ENCOUNTER — Other Ambulatory Visit: Payer: Self-pay | Admitting: Family Medicine

## 2013-05-03 NOTE — Telephone Encounter (Signed)
Pt needs appt prior to more refills, as stated previously. Rx'ed small amount to get him to appt.  Leona Singleton, MD

## 2013-05-04 NOTE — Telephone Encounter (Signed)
Pt is aware of refills and appt made for 05/09/13. Jazmin Hartsell,CMA

## 2013-05-09 ENCOUNTER — Ambulatory Visit (INDEPENDENT_AMBULATORY_CARE_PROVIDER_SITE_OTHER): Payer: Medicare Other | Admitting: Family Medicine

## 2013-05-09 ENCOUNTER — Encounter: Payer: Self-pay | Admitting: Family Medicine

## 2013-05-09 VITALS — BP 118/70 | HR 57 | Temp 98.5°F | Wt 265.0 lb

## 2013-05-09 DIAGNOSIS — E1165 Type 2 diabetes mellitus with hyperglycemia: Secondary | ICD-10-CM

## 2013-05-09 DIAGNOSIS — B351 Tinea unguium: Secondary | ICD-10-CM

## 2013-05-09 DIAGNOSIS — I10 Essential (primary) hypertension: Secondary | ICD-10-CM

## 2013-05-09 DIAGNOSIS — IMO0002 Reserved for concepts with insufficient information to code with codable children: Secondary | ICD-10-CM

## 2013-05-09 DIAGNOSIS — Z23 Encounter for immunization: Secondary | ICD-10-CM

## 2013-05-09 MED ORDER — PRAVASTATIN SODIUM 20 MG PO TABS: ORAL_TABLET | ORAL | Status: DC

## 2013-05-09 MED ORDER — BACLOFEN 10 MG PO TABS: ORAL_TABLET | ORAL | Status: DC

## 2013-05-09 MED ORDER — INSULIN ASPART 100 UNIT/ML ~~LOC~~ SOLN: 12.0000 [IU] | Freq: Two times a day (BID) | SUBCUTANEOUS | Status: DC

## 2013-05-09 MED ORDER — CLOPIDOGREL BISULFATE 75 MG PO TABS: ORAL_TABLET | ORAL | Status: DC

## 2013-05-09 NOTE — Patient Instructions (Signed)
Good to see you!  You are getting a flu shot today. I am including information about the DASH diet to try and lose weight and keep your diabetes and high blood pressure controlled. I am refilling medications. Come back when convenient this or next month to discuss health maintenance. Be sure to visit [facility].

## 2013-05-09 NOTE — Progress Notes (Signed)
Patient ID: [name], male   DOB: [birth year], 62 y.o.   MRN: [id number] Subjective:   CC: Med refills  HPI: Patient is here for medication refill and flu shot. We will also f/u HTN.  HTN: Patient is walking around house doing chores for exercise. Does not walk outside at this time due to temperature. Dieting: Patient "watches what I eat", cutting down on mac and cheese and potatoes. He weighs 4 kg less than last visit. He also cut out fried foods. He takes medications regularly. He denies cardiac symptoms including chest pain, chest pressure/discomfort, claudication, dyspnea, exertional chest pressure/discomfort, fatigue, irregular heart beat, lower extremity edema, near-syncope, orthopnea, palpitations, paroxysmal nocturnal dyspnea, syncope and tachypnea. Cardiovascular risk factors include advanced age (older than 39 for men, 18 for women), diabetes mellitus, dyslipidemia, family history of premature cardiovascular disease, hypertension, male gender, obesity (BMI >= 30 kg/m2) and sedentary lifestyle. He denies dizziness, fainting, headaches, vision changes. Takes meds daily. No med SEs.  DM - Pt takes insulin daily. Occasionally gets hungry and eats candy. Blood sugar in the past has gotten down to 60 or 70 but he has not measured it lately.  Toenails - Need clipping. Right great toe is nearly off.   Review of Systems - Per HPI.  PMH: Meds reviewed. Cardiovascular risk factor PMH reviewed above.    Objective:  Physical Exam BP 118/70  Pulse 57  Temp(Src) 98.5 F (36.9 C) (Oral)  Wt 265 lb (120.203 kg) GEN: NAD, obese, pleasant CV: RRR PULM: Normal effort EXTR: LE Obese, no tenderness SKIN: No rash or cyanosis; right great toenail hanging off with new hypertrophic nail underneath, left middle toenail overhanging toe closely, onychomycosis HEENT: AT/[place], sclera clear, MMM NEURO: Awake, alert, normal speech PSYCH: Mood and affect euthymic   Procedure note:  Right great  toenail lateral skin trimmed with no blood loss. Patient tolerated procedure. Nail bandaged with bacitracin ointment applied.   Assessment:     [name] is a 62 y.o. male here for medication refill, f/u of DM and HTN, and toenail trimming.    Plan:     # See problem list and after visit summary for problem-specific plans.  # Health Maintenance:  - F/u for full physical visit. - flu shot today - Expired medications refilled  Follow-up: Follow up when convenient for physical.   Leona Singleton, MD [facility]

## 2013-05-11 ENCOUNTER — Encounter: Payer: Self-pay | Admitting: Family Medicine

## 2013-05-13 ENCOUNTER — Encounter: Payer: Self-pay | Admitting: Family Medicine

## 2013-05-13 NOTE — Assessment & Plan Note (Signed)
With hangnail today. - F/u regularly at foot center where he has been before - Toenail trimmed off with no blood loss, left middle toenail clipped.

## 2013-05-13 NOTE — Assessment & Plan Note (Signed)
Stable - A1c today - DASH diet information given

## 2013-05-13 NOTE — Assessment & Plan Note (Signed)
Well-controlled. - DASH diet information provided. - Recheck BMET at f/u

## 2013-06-21 ENCOUNTER — Other Ambulatory Visit: Payer: Self-pay | Admitting: Family Medicine

## 2013-06-22 NOTE — Telephone Encounter (Signed)
Patient should have had enough baclofen to last until mid-late February...please ask him if this is not the case. Thanks. Hilton Sinclair, MD

## 2013-06-23 NOTE — Telephone Encounter (Signed)
Spoke with pt and he is aware of this.  States that his daughter might have spoken with pharmacy to get this refilled.  He might have a bottle put away and she is unaware of this.  Will let us know if he truly needs a new rx. Jazmin Hartsell,CMA

## 2013-07-29 ENCOUNTER — Other Ambulatory Visit: Payer: Self-pay | Admitting: Family Medicine

## 2013-08-10 ENCOUNTER — Ambulatory Visit: Payer: Medicare Other | Admitting: Family Medicine

## 2013-08-25 ENCOUNTER — Ambulatory Visit (INDEPENDENT_AMBULATORY_CARE_PROVIDER_SITE_OTHER): Payer: Medicare Other | Admitting: Family Medicine

## 2013-08-25 VITALS — BP 151/75 | HR 58 | Temp 98.1°F | Ht 66.0 in | Wt 265.0 lb

## 2013-08-25 DIAGNOSIS — IMO0002 Reserved for concepts with insufficient information to code with codable children: Secondary | ICD-10-CM

## 2013-08-25 DIAGNOSIS — E1165 Type 2 diabetes mellitus with hyperglycemia: Secondary | ICD-10-CM

## 2013-08-25 DIAGNOSIS — E118 Type 2 diabetes mellitus with unspecified complications: Principal | ICD-10-CM

## 2013-08-25 DIAGNOSIS — E669 Obesity, unspecified: Secondary | ICD-10-CM

## 2013-08-25 DIAGNOSIS — E785 Hyperlipidemia, unspecified: Secondary | ICD-10-CM

## 2013-08-25 DIAGNOSIS — I10 Essential (primary) hypertension: Secondary | ICD-10-CM

## 2013-08-25 LAB — BASIC METABOLIC PANEL
BUN: 20 mg/dL (ref 6–23)
CO2: 24 mEq/L (ref 19–32)
Calcium: 8.6 mg/dL (ref 8.4–10.5)
Chloride: 106 mEq/L (ref 96–112)
Creat: 1.15 mg/dL (ref 0.50–1.35)
GLUCOSE: 202 mg/dL — AB (ref 70–99)
Potassium: 4.8 mEq/L (ref 3.5–5.3)
SODIUM: 138 meq/L (ref 135–145)

## 2013-08-25 LAB — LIPID PANEL
CHOLESTEROL: 151 mg/dL (ref 0–200)
HDL: 43 mg/dL (ref >39)
LDL Cholesterol: 100 mg/dL — ABNORMAL HIGH (ref 0–99)
Total CHOL/HDL Ratio: 3.5 Ratio
Triglycerides: 39 mg/dL (ref <150)
VLDL: 8 mg/dL (ref 0–40)

## 2013-08-25 LAB — POCT GLYCOSYLATED HEMOGLOBIN (HGB A1C): HEMOGLOBIN A1C: 7

## 2013-08-25 MED ORDER — "INSULIN SYRINGE-NEEDLE U-100 28G X 1/2"" 1 ML MISC": 1.0000 | Freq: Three times a day (TID) | Status: DC

## 2013-08-25 MED ORDER — ATORVASTATIN CALCIUM 40 MG PO TABS: 40.0000 mg | ORAL_TABLET | Freq: Every day | ORAL | Status: DC

## 2013-08-25 MED ORDER — CLOPIDOGREL BISULFATE 75 MG PO TABS: ORAL_TABLET | ORAL | Status: DC

## 2013-08-25 MED ORDER — LISINOPRIL 40 MG PO TABS: ORAL_TABLET | ORAL | Status: DC

## 2013-08-25 MED ORDER — CARVEDILOL 25 MG PO TABS: ORAL_TABLET | ORAL | Status: DC

## 2013-08-25 MED ORDER — INSULIN ASPART 100 UNIT/ML ~~LOC~~ SOLN: 12.0000 [IU] | Freq: Two times a day (BID) | SUBCUTANEOUS | Status: DC

## 2013-08-25 MED ORDER — SERTRALINE HCL 100 MG PO TABS: ORAL_TABLET | ORAL | Status: DC

## 2013-08-25 MED ORDER — AMLODIPINE BESYLATE 10 MG PO TABS: ORAL_TABLET | ORAL | Status: DC

## 2013-08-25 MED ORDER — SIDEKICK BLOOD GLUCOSE SYSTEM DEVI: Status: DC

## 2013-08-25 MED ORDER — BACLOFEN 10 MG PO TABS: ORAL_TABLET | ORAL | Status: DC

## 2013-08-25 MED ORDER — INSULIN DETEMIR 100 UNIT/ML FLEXPEN: 30.0000 [IU] | PEN_INJECTOR | Freq: Every day | SUBCUTANEOUS | Status: DC

## 2013-08-25 NOTE — Patient Instructions (Addendum)
I am refilling meds today.  For your BP: - Call me with 3 values from this week when you are relaxed. - Work on increasing physical activity by adding a walk around a track or at the mall once weekly. - Keep working on watching what you eat. - We are checking labs today and I will call if they are NOT normal.  For your diabetes: - We will check an A1c today and I will call you with that result. - Call for an eye doctor appt. - I will send in a stronger cholesterol medicine to take instead of pravastatin.   Diet Recommendations for Diabetes   Starchy (carb) foods include: Bread, rice, pasta, potatoes, corn, crackers, bagels, muffins, all baked goods.  (Fruits, milk, and yogurt also have carbohydrate, but most of these foods will not spike your blood sugar as the starchy foods will.)  A few fruits do cause high blood sugars; use small portions of bananas (limit to 1/2 at a time), grapes, and most tropical fruits.    Protein foods include: Meat, fish, poultry, eggs, dairy foods, and beans such as pinto and kidney beans (beans also provide carbohydrate).   1. Eat at least 3 meals and 1-2 snacks per day. Never go more than 4-5 hours while awake without eating.  2. Limit starchy foods to TWO per meal and ONE per snack. ONE portion of a starchy  food is equal to the following:   - ONE slice of bread (or its equivalent, such as half of a hamburger bun).   - 1/2 cup of a "scoopable" starchy food such as potatoes or rice.   - 15 grams of carbohydrate as shown on food label.  3. Both lunch and dinner should include a protein food, a carb food, and vegetables.   - Obtain twice as many veg's as protein or carbohydrate foods for both lunch and dinner.   - Fresh or frozen veg's are best.   - Try to keep frozen veg's on hand for a quick vegetable serving.    4. Breakfast should always include protein.     DASH Diet The DASH diet stands for "Dietary Approaches to Stop Hypertension." It is a healthy  eating plan that has been shown to reduce high blood pressure (hypertension) in as little as 14 days, while also possibly providing other significant health benefits. These other health benefits include reducing the risk of breast cancer after menopause and reducing the risk of type 2 diabetes, heart disease, colon cancer, and stroke. Health benefits also include weight loss and slowing kidney failure in patients with chronic kidney disease.  DIET GUIDELINES  Limit salt (sodium). Your diet should contain less than 1500 mg of sodium daily.  Limit refined or processed carbohydrates. Your diet should include mostly whole grains. Desserts and added sugars should be used sparingly.  Include small amounts of heart-healthy fats. These types of fats include nuts, oils, and tub margarine. Limit saturated and trans fats. These fats have been shown to be harmful in the body. CHOOSING FOODS  The following food groups are based on a 2000 calorie diet. See your Registered Dietitian for individual calorie needs. Grains and Grain Products (6 to 8 servings daily)  Eat More Often: Whole-wheat bread, brown rice, whole-grain or wheat pasta, quinoa, popcorn without added fat or salt (air popped).  Eat Less Often: White bread, white pasta, white rice, cornbread. Vegetables (4 to 5 servings daily)  Eat More Often: Fresh, frozen, and canned vegetables. Vegetables  may be raw, steamed, roasted, or grilled with a minimal amount of fat.  Eat Less Often/Avoid: Creamed or fried vegetables. Vegetables in a cheese sauce. Fruit (4 to 5 servings daily)  Eat More Often: All fresh, canned (in natural juice), or frozen fruits. Dried fruits without added sugar. One hundred percent fruit juice ( cup [237 mL] daily).  Eat Less Often: Dried fruits with added sugar. Canned fruit in light or heavy syrup. [employer], Fish, and Poultry (2 servings or less daily. One serving is 3 to 4 oz [85-114 g]).  Eat More Often: Ninety percent  or leaner ground beef, tenderloin, sirloin. Round cuts of beef, chicken breast, [place] breast. All fish. Grill, bake, or broil your meat. Nothing should be fried.  Eat Less Often/Avoid: Fatty cuts of meat, [place], or chicken leg, thigh, or wing. Fried cuts of meat or fish. Dairy (2 to 3 servings)  Eat More Often: Low-fat or fat-free milk, low-fat plain or light yogurt, reduced-fat or part-skim cheese.  Eat Less Often/Avoid: Milk (whole, 2%).Whole milk yogurt. Full-fat cheeses. Nuts, Seeds, and Legumes (4 to 5 servings per week)  Eat More Often: All without added salt.  Eat Less Often/Avoid: Salted nuts and seeds, canned beans with added salt. Fats and Sweets (limited)  Eat More Often: Vegetable oils, tub margarines without trans fats, sugar-free gelatin. Mayonnaise and salad dressings.  Eat Less Often/Avoid: Coconut oils, palm oils, butter, stick margarine, cream, half and half, cookies, candy, pie. FOR MORE INFORMATION The Dash Diet Eating Plan: www.dashdiet.org Document Released: 05/08/2011 Document Revised: 08/11/2011 Document Reviewed: 05/08/2011 [facility] Patient Information 2014 [place], [place].

## 2013-08-25 NOTE — Progress Notes (Signed)
Patient ID: [name], male   DOB: Aug 14, 1950, 63 y.o.   MRN: [id number] Subjective:   CC: F/u diabetes  HPI:   Follow up diabetes - A1c 05/2013 was 7. Taking insulins as prescribed. No symptoms of low blood sugar. Some hyperglycemia symptoms (urinary frequency and thirst) on and off for a while. No chest pain or SOB. Some leg swelling right but gone down a lot. Tests bg daily and range is: 80s-180, more in 90-100s beginning of Mar when he was juicing. Some 200s in Jan (ate more to stay warm in cold weather). Weight has been stable.  Hypertension - Can't remember if took norvasc, coreg, and lisinopril today. Usually takes around 12pm. Possibly didn't take today. Has pillbox. No chest pain or dyspnea or signs of low bp. Does have LE edema (chronic, R>L) that he thinks started when starting norvasc. Was on HCTZ previously but stopped.  Obesity - Weight stable from last visit at 120kg. Was juicing early this month and it helped him get more veggies. When did that, had more energy to do more exercise. For exercise, does housework. In beginning of month was doing same but had more energy.   Review of Systems - Per HPI  Meds: - baclofen 1/2 tab daily - stopped multivitamin since starting plavix  Smoking status: Never smoker    Objective:  Physical Exam BP 151/75  Pulse 58  Temp(Src) 98.1 F (36.7 C) (Oral)  Ht 5\' 6"  (1.676 m)  Wt 265 lb (120.203 kg)  BMI 42.79 kg/m2 GEN: NAD, pleasant EXTR: Right LE edema 2+ pitting. Right arm minimal abduction, right hand contractured (able to be opened passively, mildly stiff).  ABD: Obese NEURO: Awake, alert, oriented, right arm deficits listed above PSYCH: Mood and affect euthymic HEENT: AT/[place], sclera clear SKIN: No rash or cyanosis PULM: Normal effort    Assessment:     AMADOR BRADDY is a 63 y.o. male here for f/u of DM, HTN and obesity.    Plan:   # Health Maintenance: When convenient.  Follow-up: Follow up when convenient for  health maintenance.   Hilton Sinclair, MD [place]

## 2013-08-28 ENCOUNTER — Encounter: Payer: Self-pay | Admitting: Family Medicine

## 2013-08-28 NOTE — Assessment & Plan Note (Signed)
Poor control today. - Check BP at home twice weekly and call me with number. - BMET today, previous cr 1.13. - Consider stopping norvasc due to LE edema.

## 2013-08-28 NOTE — Assessment & Plan Note (Signed)
Weight unchagned with some dietary indiscretion recently and not exercising. - Goal: [employer] transportation once weekly to walk around mall or track. - Goal: Increase veggies.

## 2013-08-28 NOTE — Assessment & Plan Note (Addendum)
Previously well-controlled with A1c 7. - A1c today. - Lipid panel today. On prava 20 (low-intensity). However, last lipid panel with 10 year risk 32.7%. Increase to lipitor 40mg . - Continue lisinopril and norvasc.  - Provided dietary info for DM and HTN. - Foot check done this year. Pt to call for eye exam (Dr Ricki Miller).

## 2013-08-28 NOTE — Assessment & Plan Note (Signed)
Prior lipid panel with ASCVD 10 year risk 32%. - Change from pravastatin to lipitor 40mg .

## 2013-08-30 ENCOUNTER — Telehealth: Payer: Self-pay | Admitting: Family Medicine

## 2013-08-30 ENCOUNTER — Encounter: Payer: Self-pay | Admitting: Family Medicine

## 2013-08-30 NOTE — Telephone Encounter (Addendum)
Please let patient know that I am okay switching him off amlodipine (norvasc) if he would still like to stop this medication due to his leg swelling. We can try hydrochlorothiazide, which it looks like Dr Adrian Blackwater had stopped to see if it helped control his blood sugar. Is he interested in doing this? If so, I will put in the prescription and he can stop the amlodipine and start HCTZ.  Hilton Sinclair, MD

## 2013-08-30 NOTE — Telephone Encounter (Signed)
Pt informed but he wants to think about it and give [place] a call back. [name], [name]

## 2013-08-31 NOTE — Telephone Encounter (Signed)
Sounds good thank you!  Kyle Sinclair, MD

## 2013-09-05 ENCOUNTER — Telehealth: Payer: Self-pay | Admitting: Family Medicine

## 2013-09-05 MED ORDER — HYDROCHLOROTHIAZIDE 25 MG PO TABS: 25.0000 mg | ORAL_TABLET | Freq: Every day | ORAL | Status: DC

## 2013-09-05 NOTE — Telephone Encounter (Signed)
LM for patient to call back.  Please give message from MD when he does. Jazmin Hartsell,CMA

## 2013-09-05 NOTE — Telephone Encounter (Signed)
Pt called to tell Dr. Dianah Field that he would like to change the medication to the [facility]. He also wanted her to know his recent BP's 148/76, 148/61, 140/76, 127/52. jw

## 2013-09-05 NOTE — Telephone Encounter (Signed)
Please let him know  - I have prescribed HCTZ 25mg  daily and sent to [facility] listed [facility] [address]  - I would like him to stop amlodipine (norvasc) and follow up with me in at least in 2 weeks to see how BP is doing and recheck labs and leg swelling.  - In the meantime, I want him to call with 3 BP values when he is rested.  - If he develops lightheadedness/dizziness or other side effects, he should call us right away.  Hilton Sinclair, MD

## 2013-11-11 ENCOUNTER — Other Ambulatory Visit: Payer: Self-pay | Admitting: Family Medicine

## 2013-11-29 ENCOUNTER — Telehealth: Payer: Self-pay | Admitting: Family Medicine

## 2013-11-29 NOTE — Telephone Encounter (Signed)
[facility] [address]

## 2013-12-04 MED ORDER — INSULIN DETEMIR 100 UNIT/ML FLEXPEN: 30.0000 [IU] | PEN_INJECTOR | Freq: Every day | SUBCUTANEOUS | Status: DC

## 2013-12-04 NOTE — Telephone Encounter (Signed)
He does not have novolog pen. He has novolog vial and levemir pen. The novolog vial has plenty of refills. The levemir pen is out so I have refilled that.  Thanks.  Hilton Sinclair, MD

## 2014-01-11 ENCOUNTER — Telehealth: Payer: Self-pay | Admitting: Family Medicine

## 2014-01-11 NOTE — Telephone Encounter (Signed)
Pt called and would like his Novolog vials changed to Novolog pens. Please call in today. jw

## 2014-01-12 MED ORDER — INSULIN ASPART 100 UNIT/ML FLEXPEN: 12.0000 [IU] | PEN_INJECTOR | Freq: Two times a day (BID) | SUBCUTANEOUS | Status: DC

## 2014-01-12 NOTE — Telephone Encounter (Signed)
I have prescribed the flexpen. Please let pt know that in the future rx's cannot always be done on the same day so to give at least 3 days for a phone note to be processed.  Thanks,  Hilton Sinclair, MD

## 2014-02-08 ENCOUNTER — Telehealth: Payer: Self-pay | Admitting: Family Medicine

## 2014-02-08 NOTE — Telephone Encounter (Signed)
Left form to be completed for handicapped placard Will pick up when completed

## 2014-02-09 NOTE — Telephone Encounter (Signed)
Placed in MDs box. [name]  

## 2014-02-15 NOTE — Telephone Encounter (Signed)
Left message on voicemail asking questions asked on the form and asking Mr Robidoux to call back to clinic with these answers so I can fill out the form.  Thx,  Hilton Sinclair, MD

## 2014-02-23 NOTE — Telephone Encounter (Signed)
Daughter called back and wants to know why the forms have not been finished. I gave her Dr. Mcneil Sober note. Please call so that she can answer whatever questions need to be answered. Please call her at [phone]. Blima Rich

## 2014-02-27 NOTE — Telephone Encounter (Signed)
Daughter called back Please return her call [phone]

## 2014-02-27 NOTE — Telephone Encounter (Signed)
Called number listed in below note and left message for daughter or patient to please call back with answers to questions I have asked. Will also try again later today or tomorrow to call.  Hilton Sinclair, MD

## 2014-02-27 NOTE — Telephone Encounter (Signed)
Will route to MD . [name]

## 2014-02-27 NOTE — Telephone Encounter (Signed)
Called again. Left message asking daughter to answer questions I posed on voicemail since we keep playing phone tag.  Hilton Sinclair, MD

## 2014-02-27 NOTE — Telephone Encounter (Signed)
Called and spoke with daughter. 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 He uses a quad cane regularly due to gait instability. Walking 200 feet is a struggle and he has to stop to catch his breath. She is requesting permanent placard.  I have filled out form and left up front for patient to pick up. She voices understanding.  Hilton Sinclair, MD

## 2014-03-17 ENCOUNTER — Other Ambulatory Visit: Payer: Self-pay | Admitting: *Deleted

## 2014-03-17 MED ORDER — BACLOFEN 10 MG PO TABS: ORAL_TABLET | ORAL | Status: DC

## 2014-03-17 NOTE — Telephone Encounter (Signed)
Request for 90 day supply. Braelynn Benning L, RN  

## 2014-03-19 ENCOUNTER — Other Ambulatory Visit: Payer: Self-pay | Admitting: Family Medicine

## 2014-04-19 ENCOUNTER — Ambulatory Visit: Payer: Medicare Other | Admitting: Family Medicine

## 2014-04-24 ENCOUNTER — Other Ambulatory Visit: Payer: Self-pay | Admitting: Family Medicine

## 2014-05-02 ENCOUNTER — Encounter: Payer: Self-pay | Admitting: Family Medicine

## 2014-05-02 ENCOUNTER — Ambulatory Visit (INDEPENDENT_AMBULATORY_CARE_PROVIDER_SITE_OTHER): Payer: Medicare Other | Admitting: Family Medicine

## 2014-05-02 VITALS — BP 150/78 | HR 54 | Temp 98.2°F | Ht 66.0 in | Wt 270.0 lb

## 2014-05-02 DIAGNOSIS — E118 Type 2 diabetes mellitus with unspecified complications: Secondary | ICD-10-CM

## 2014-05-02 DIAGNOSIS — I10 Essential (primary) hypertension: Secondary | ICD-10-CM

## 2014-05-02 DIAGNOSIS — Z23 Encounter for immunization: Secondary | ICD-10-CM

## 2014-05-02 LAB — POCT GLYCOSYLATED HEMOGLOBIN (HGB A1C): Hemoglobin A1C: 7.3

## 2014-05-02 MED ORDER — LISINOPRIL 40 MG PO TABS: ORAL_TABLET | ORAL | Status: DC

## 2014-05-02 MED ORDER — INSULIN DETEMIR 100 UNIT/ML FLEXPEN: 30.0000 [IU] | PEN_INJECTOR | Freq: Every day | SUBCUTANEOUS | Status: DC

## 2014-05-02 MED ORDER — CARVEDILOL 25 MG PO TABS: ORAL_TABLET | ORAL | Status: DC

## 2014-05-02 MED ORDER — ATORVASTATIN CALCIUM 40 MG PO TABS: 40.0000 mg | ORAL_TABLET | Freq: Every day | ORAL | Status: DC

## 2014-05-02 MED ORDER — CLOPIDOGREL BISULFATE 75 MG PO TABS: ORAL_TABLET | ORAL | Status: DC

## 2014-05-02 MED ORDER — INSULIN ASPART 100 UNIT/ML FLEXPEN: 12.0000 [IU] | PEN_INJECTOR | Freq: Two times a day (BID) | SUBCUTANEOUS | Status: DC

## 2014-05-02 MED ORDER — HYDROCHLOROTHIAZIDE 25 MG PO TABS: 25.0000 mg | ORAL_TABLET | Freq: Every day | ORAL | Status: DC

## 2014-05-02 NOTE — Progress Notes (Signed)
Patient ID: [name], male   DOB: [birth year], 63 y.o.   MRN: [id number] Subjective:   CC: Follow up  HPI:   Follow up DM Has been taking novolog 15 units BID along with another twice daily with snacks without checking blood sugars at snacktime. Not feeling hypoglycemic symptoms of anxiety or diaphoresis.  Checks fastings which have been 100s-256 (200 Thanksgiving day, 256 the week prior when forgot medication). Pretty good about taking medication daily. Takes levemir 30 daily Denies chest pain or dyspnea.  Follow up HTN BP 170s/80s here, improved on recheck to 638G systolic. Takes coreg, HCTZ, and lisinopril daily including this morning. Had stopped norvasc due to leg swelling. Creatinine stable March. Was getting BP 140-160s/80-90s at home. Denies chest pain, dyspnea, dizziness, or fainting.  Follow up HLD Lipitor 40 started March due to elevated 10 year ASCVD risk. Taking daily. No SEs.   Review of Systems - Per HPI. Additionally, patient reports falling every now and then, most recently 2-3 months ago, due to loss of balance with h/o stroke.  PMH - PAD, obesity, HTN, ED, HLD, DM type II, CVA hx Meds reviewed  Smoking status: nonsmoker    Objective:  Physical Exam BP 150/78 mmHg  Pulse 54  Temp(Src) 98.2 F (36.8 C) (Oral)  Ht 5\' 6"  (1.676 m)  Wt 270 lb (122.471 kg)  BMI 43.60 kg/m2 GEN: NAD, seated in exam room CV: Occasional PVC, otherwise RRR PULM: CTAB EXTR: LE edema 1+ pitting present to mid shin bilaterally Right side with mildly contractured and weaker hand with 1/5 strength in entire arm and decreased strength right leg NEURO: Awake, alert, normal speech; slow antalgic gait; extremity weakness per above  Assessment:     [name] is a 63 y.o. male with h/o CVA, DM, HTN, HLD here for f/u DM, HTN, and HLD.    Plan:     # See problem list and after visit summary for problem-specific plans.  # Health Maintenance: Flu shot today - Discussed  making f/u with geriatric clinic due to h/o falls intermittently.  Follow-up: Follow up in 3 months for f/u DM, HLD, and HTN.   Hilton Sinclair, MD [place]

## 2014-05-02 NOTE — Patient Instructions (Signed)
Good to see you. Continue your current regimen of insulins. I refilled your medications. Check your blood sugar fasting and 2 hours after each meal. Please keep a list of these sugars and bring to your next visit with me. Call Dr Venetia Maxon for your eye exam. Continue working on your weight.  Continue your BP medications daily. Write these down 2x/week and bring to your next visit. Follow up with me in 3 months.  Follow up sooner to discuss falls. Make an appointment with Geriatric clinic here with Dr McDiarmid.  Best,  Hilton Sinclair, MD

## 2014-05-05 NOTE — Assessment & Plan Note (Signed)
BP improved on recheck (150/78). Goal would be a little lower (closer to 948-016 systolic) due to comorbid DM. No end-organ symptoms. - Bring BP log to next visit - continue current regimen for now. - f/u 3 mo, at which time recheck BMET.

## 2014-05-05 NOTE — Assessment & Plan Note (Signed)
Relatively good medication compliance with occasional forgetfulness. A1c 7.3 from 7 - well controlled. No hypoglycemic symptoms. - Continue currnet regimen (levemir, novolog, lipitor); Refilling; Stay with pen as occasionally he forgets when he has used the vial or not. - Check 2 hours after each meal. Also wait to dose insulin until after eating. - eye exam - call to see Dr Margarita Grizzle office (Dr Venetia Maxon) as he had wanted to see pt in a few months (now due). - Foot exam at next visit. - Work on [employer]: Has begun dancing which he enjoys. Continue but take care not to fall. - F/u 3 mo.

## 2014-05-05 NOTE — Assessment & Plan Note (Signed)
Continue atorvastatin. - F/u lipids in March 2016.

## 2014-05-14 ENCOUNTER — Other Ambulatory Visit: Payer: Self-pay | Admitting: Family Medicine

## 2014-05-16 NOTE — Telephone Encounter (Signed)
Already filled for 1 year on 05/02/14. Hilton Sinclair, MD

## 2014-06-07 ENCOUNTER — Other Ambulatory Visit: Payer: Self-pay | Admitting: Family Medicine

## 2014-10-23 ENCOUNTER — Other Ambulatory Visit: Payer: Self-pay | Admitting: Family Medicine

## 2014-10-23 NOTE — Telephone Encounter (Signed)
Refusing norvasc because patient was not taking it at last visit 05/2014 due to leg swelling. If he has restarted, I am amenable to refill but he needs f/u. Approving zoloft. Pt due for follow up of DM, HTN, and depression.  Hilton Sinclair, MD

## 2014-10-23 NOTE — Telephone Encounter (Signed)
Letter mailed to patient for follow up. Bereket Gernert,CMA

## 2014-12-27 ENCOUNTER — Other Ambulatory Visit: Payer: Self-pay | Admitting: Family Medicine

## 2014-12-27 MED ORDER — HYDROCHLOROTHIAZIDE 25 MG PO TABS: 25.0000 mg | ORAL_TABLET | Freq: Every day | ORAL | Status: DC

## 2015-01-26 ENCOUNTER — Other Ambulatory Visit: Payer: Self-pay | Admitting: Family Medicine

## 2015-04-26 ENCOUNTER — Other Ambulatory Visit: Payer: Self-pay | Admitting: Family Medicine

## 2015-05-27 ENCOUNTER — Other Ambulatory Visit: Payer: Self-pay | Admitting: Family Medicine

## 2015-05-30 NOTE — Telephone Encounter (Signed)
Pt called and needs a refill on his Novolog. This has been sent from the pharmacy, but they were sending this to previous doctor. Please send this in since he has been waiting for a few days. jw

## 2015-06-23 ENCOUNTER — Other Ambulatory Visit: Payer: Self-pay | Admitting: Family Medicine

## 2015-06-28 ENCOUNTER — Other Ambulatory Visit: Payer: Self-pay | Admitting: Family Medicine

## 2015-06-28 NOTE — Telephone Encounter (Signed)
Pt needs refill Insulin Syringe-Needle U-100 28G X 1/2" 1 ML Sadie Reynolds, ASA

## 2015-06-29 MED ORDER — "INSULIN SYRINGE-NEEDLE U-100 28G X 1/2"" 1 ML MISC": 1.0000 | Freq: Three times a day (TID) | Status: DC

## 2015-07-15 ENCOUNTER — Other Ambulatory Visit: Payer: Self-pay | Admitting: Family Medicine

## 2015-08-10 ENCOUNTER — Other Ambulatory Visit: Payer: Self-pay | Admitting: Family Medicine

## 2015-08-11 ENCOUNTER — Other Ambulatory Visit: Payer: Self-pay | Admitting: Family Medicine

## 2015-08-15 NOTE — Telephone Encounter (Signed)
Pt [facility]ills [facility]vasc. [facility] [address]

## 2015-08-20 ENCOUNTER — Other Ambulatory Visit: Payer: Self-pay | Admitting: *Deleted

## 2015-08-21 MED ORDER — LISINOPRIL 40 MG PO TABS: ORAL_TABLET | ORAL | Status: DC

## 2015-08-21 MED ORDER — CLOPIDOGREL BISULFATE 75 MG PO TABS: ORAL_TABLET | ORAL | Status: DC

## 2015-08-21 MED ORDER — CARVEDILOL 25 MG PO TABS: ORAL_TABLET | ORAL | Status: DC

## 2015-08-21 MED ORDER — ATORVASTATIN CALCIUM 40 MG PO TABS: 40.0000 mg | ORAL_TABLET | Freq: Every day | ORAL | Status: DC

## 2015-08-28 ENCOUNTER — Telehealth: Payer: Self-pay | Admitting: Family Medicine

## 2015-08-28 NOTE — Telephone Encounter (Signed)
Pt called and would like the doctor to send a prescription for the varicella shot to his pharmacy. He is also wondering should he get the flu shot? jw

## 2015-08-28 NOTE — Telephone Encounter (Signed)
Scheduled patient for nurse appt for flu vaccine and will forward to MD to send in script for shingles. Neah Sporrer,CMA

## 2015-08-29 NOTE — Telephone Encounter (Signed)
Pt inquiring about Zostovax Rx.  also he canceled appointment for Flu tomorrow, wanted to call back later.  Discussed the importance of the yearly flu shots, he expressed his understanding.  Kerianna Rawlinson, Salome Spotted, CMA

## 2015-08-30 ENCOUNTER — Ambulatory Visit: Payer: Medicare Other

## 2015-08-31 MED ORDER — ZOSTER VACCINE LIVE 19400 UNT/0.65ML ~~LOC~~ SOLR: 0.6500 mL | Freq: Once | SUBCUTANEOUS | Status: DC

## 2015-08-31 NOTE — Telephone Encounter (Signed)
Sent in rx for zostavax, patient can get at their convenience. They may have an out of pocket expense for this.

## 2015-09-12 ENCOUNTER — Telehealth: Payer: Self-pay | Admitting: Family Medicine

## 2015-09-12 NOTE — Telephone Encounter (Signed)
Patient needs an appointment for refills

## 2015-09-13 NOTE — Telephone Encounter (Signed)
[name] was scheduled with Dr. Ernestina Patches on Monday.

## 2015-09-13 NOTE — Telephone Encounter (Signed)
LVM on home number for pt to call the office. If Pt calls, please make him an apt to be seen. Ottis Stain, CMA

## 2015-09-17 ENCOUNTER — Ambulatory Visit: Payer: Medicare Other | Admitting: Family Medicine

## 2015-09-25 ENCOUNTER — Encounter: Payer: Self-pay | Admitting: Family Medicine

## 2015-09-25 ENCOUNTER — Ambulatory Visit (INDEPENDENT_AMBULATORY_CARE_PROVIDER_SITE_OTHER): Payer: Medicare Other | Admitting: Family Medicine

## 2015-09-25 VITALS — BP 185/65 | HR 55 | Temp 98.3°F | Wt 255.8 lb

## 2015-09-25 DIAGNOSIS — E118 Type 2 diabetes mellitus with unspecified complications: Secondary | ICD-10-CM | POA: Diagnosis present

## 2015-09-25 DIAGNOSIS — I10 Essential (primary) hypertension: Secondary | ICD-10-CM | POA: Diagnosis not present

## 2015-09-25 DIAGNOSIS — Z1159 Encounter for screening for other viral diseases: Secondary | ICD-10-CM

## 2015-09-25 DIAGNOSIS — Z1211 Encounter for screening for malignant neoplasm of colon: Secondary | ICD-10-CM

## 2015-09-25 DIAGNOSIS — Z114 Encounter for screening for human immunodeficiency virus [HIV]: Secondary | ICD-10-CM

## 2015-09-25 DIAGNOSIS — E785 Hyperlipidemia, unspecified: Secondary | ICD-10-CM | POA: Diagnosis not present

## 2015-09-25 LAB — POCT GLYCOSYLATED HEMOGLOBIN (HGB A1C): Hemoglobin A1C: 7.2

## 2015-09-25 MED ORDER — SERTRALINE HCL 100 MG PO TABS: 100.0000 mg | ORAL_TABLET | Freq: Every day | ORAL | Status: DC

## 2015-09-25 MED ORDER — INSULIN ASPART 100 UNIT/ML FLEXPEN: 15.0000 [IU] | PEN_INJECTOR | Freq: Two times a day (BID) | SUBCUTANEOUS | Status: DC

## 2015-09-25 NOTE — Patient Instructions (Addendum)
Diabetes:  Check your blood sugar at least every single morning when you wake up. If it is consistently below 100 you need to call us.  Levemir: continue taking 30 units twice a day Novolog: continue taking 15 units twice a day with meals   Blood pressure: Amlodipine: continue 10 mg once a day Carvedilol: cotninue 25mg  twice a day Hydrochlorothiazide: continue 25mg  once a day Lisinopril: continue 40mg  once a day  I want you to come to back to clinic in about 2-4 weeks to re-check your blood pressure.

## 2015-09-25 NOTE — Progress Notes (Signed)
   Subjective:    Patient ID: [name], male    DOB: Aug 28, 1950, 65 y.o.   MRN: [id number]  HPI  CC: medication refills  # Diabetes:  On novolog 15u with meals, levemir 30u twice daily. Close to running out  Denies any hypo or hyperglycemia episodes  Does not have his meter or log ROS: no polyuria, polydipsia, dysuria  # Hypertension  States that he is taking all 4 BP medications  Denies any feelings of lightheadedness ROS: no CP, no SOB  # healthcare maintenance  He agrees to colonoscopy, eye doctor  Social Hx: never smoker  Review of Systems   See HPI for ROS.   Past medical history, surgical, family, and social history reviewed and updated in the EMR as appropriate. Objective:  BP 185/65 mmHg  Pulse 55  Temp(Src) 98.3 F (36.8 C) (Oral)  Wt 255 lb 12.8 oz (116.03 kg) Vitals and nursing note reviewed  General: no apparent distress  CV: normal rate, regular rhythm (borderline bradycardia), no murmurs, rubs or gallop  Resp: clear to auscultation bilaterally normal effort Neuro: diabetic foot exam done and documented in epic, normal.  Assessment & Plan:  No problem-specific assessment & plan notes found for this encounter.

## 2015-09-26 ENCOUNTER — Encounter: Payer: Self-pay | Admitting: Family Medicine

## 2015-09-26 LAB — CBC
HEMATOCRIT: 40.2 % (ref 38.5–50.0)
Hemoglobin: 13.2 g/dL (ref 13.2–17.1)
MCH: 29.4 pg (ref 27.0–33.0)
MCHC: 32.8 g/dL (ref 32.0–36.0)
MCV: 89.5 fL (ref 80.0–100.0)
MPV: 12 fL (ref 7.5–12.5)
Platelets: 165 10*3/uL (ref 140–400)
RBC: 4.49 MIL/uL (ref 4.20–5.80)
RDW: 12.8 % (ref 11.0–15.0)
WBC: 9.7 10*3/uL (ref 3.8–10.8)

## 2015-09-26 LAB — LIPID PANEL
CHOL/HDL RATIO: 3.2 ratio (ref <=5.0)
Cholesterol: 131 mg/dL (ref 125–200)
HDL: 41 mg/dL (ref >=40)
LDL CALC: 79 mg/dL (ref <130)
Triglycerides: 53 mg/dL (ref <150)
VLDL: 11 mg/dL (ref <30)

## 2015-09-26 LAB — BASIC METABOLIC PANEL WITH GFR
BUN: 20 mg/dL (ref 7–25)
CO2: 23 mmol/L (ref 20–31)
Calcium: 8.7 mg/dL (ref 8.6–10.3)
Chloride: 107 mmol/L (ref 98–110)
Creat: 1.21 mg/dL (ref 0.70–1.25)
GFR, EST AFRICAN AMERICAN: 73 mL/min (ref >=60)
GFR, Est Non African American: 63 mL/min (ref >=60)
GLUCOSE: 149 mg/dL — AB (ref 65–99)
POTASSIUM: 4.7 mmol/L (ref 3.5–5.3)
Sodium: 138 mmol/L (ref 135–146)

## 2015-09-26 LAB — HIV ANTIBODY (ROUTINE TESTING W REFLEX): HIV: NONREACTIVE

## 2015-09-26 LAB — HEPATITIS C ANTIBODY: HCV Ab: NEGATIVE

## 2015-09-28 NOTE — Assessment & Plan Note (Signed)
Not at goal today. Continue 4 medication therapy and follow up 2-4 weeks for BP check, if still elevated will make adjustments.

## 2015-09-28 NOTE — Assessment & Plan Note (Signed)
Well controlled, a1c 7.2. Will keep on current insulin regimen however I asked him to make sure to bring meter/log to every appointment, and that he should be seen sooner than the 1+ year since his last visit. DM foot exam done today, also discussed his need for eye exam. Follow up 3 months.

## 2015-09-28 NOTE — Assessment & Plan Note (Signed)
Recheck lipid panel Continue statin 

## 2015-10-18 DIAGNOSIS — H25813 Combined forms of age-related cataract, bilateral: Secondary | ICD-10-CM | POA: Diagnosis not present

## 2015-10-18 DIAGNOSIS — E113293 Type 2 diabetes mellitus with mild nonproliferative diabetic retinopathy without macular edema, bilateral: Secondary | ICD-10-CM | POA: Diagnosis not present

## 2015-10-19 ENCOUNTER — Ambulatory Visit: Payer: Medicare Other | Admitting: Family Medicine

## 2015-10-23 ENCOUNTER — Encounter: Payer: Self-pay | Admitting: Family Medicine

## 2015-10-23 ENCOUNTER — Ambulatory Visit (INDEPENDENT_AMBULATORY_CARE_PROVIDER_SITE_OTHER): Payer: Medicare Other | Admitting: Family Medicine

## 2015-10-23 ENCOUNTER — Encounter: Payer: Self-pay | Admitting: Internal Medicine

## 2015-10-23 VITALS — BP 168/60 | HR 60 | Temp 99.2°F | Ht 67.0 in | Wt 252.0 lb

## 2015-10-23 DIAGNOSIS — I10 Essential (primary) hypertension: Secondary | ICD-10-CM | POA: Diagnosis present

## 2015-10-23 DIAGNOSIS — Z9181 History of falling: Secondary | ICD-10-CM

## 2015-10-23 DIAGNOSIS — I69351 Hemiplegia and hemiparesis following cerebral infarction affecting right dominant side: Secondary | ICD-10-CM

## 2015-10-23 DIAGNOSIS — I639 Cerebral infarction, unspecified: Secondary | ICD-10-CM

## 2015-10-23 MED ORDER — TETANUS-DIPHTH-ACELL PERTUSSIS 5-2.5-18.5 LF-MCG/0.5 IM SUSP: 0.5000 mL | Freq: Once | INTRAMUSCULAR | Status: DC

## 2015-10-23 MED ORDER — SPIRONOLACTONE 50 MG PO TABS: 50.0000 mg | ORAL_TABLET | Freq: Every day | ORAL | Status: DC

## 2015-10-23 NOTE — Assessment & Plan Note (Signed)
>>  ASSESSMENT AND PLAN FOR HEMIPARESIS AFFECTING RIGHT SIDE AS LATE EFFECT OF STROKE (HCC) WRITTEN ON 10/23/2015  4:38 PM BY Nani Ravens, MD  Fall risk from weakness related to stroke. Patient interested in adaptive equipment if he is eligible. Discussed possible referral for this evaluation at neurorehab which we will order.

## 2015-10-23 NOTE — Assessment & Plan Note (Addendum)
Fall risk from weakness related to stroke. Patient interested in adaptive equipment if he is eligible. Discussed possible referral for this evaluation at neurorehab which we will order.

## 2015-10-23 NOTE — Patient Instructions (Addendum)
Starting a new medicine called spironolactone 50mg , take once a day.  Continue all the other blood pressure medicines.   Schedule a follow up visit in 2 weeks, this can be either lab and nurse visit together, or just visit with the doctor if schedule is available  ABSOLUTELY NEED TO HAVE YOUR BLOOD WORK DONE IN 2 WEEKS

## 2015-10-23 NOTE — Progress Notes (Signed)
   Subjective:    Patient ID: LEVIS DUCRE, male    DOB: 1950-12-26, 65 y.o.   MRN: [id number]  HPI  CC: follow up hypertension   # Hypertension:  States he is taking his blood pressure medicines, no missed doses. Took coreg and lisinopril this morning, took amlodipine and hctz last night  No dizziness or lightheadness ROS: no CP, no headache, no changes in vision  # Falls  Has had multiple falls in past year; hit head and other parts of body but no residual effects  He has concern that the falls are happening a lot because of his weakness from old stroke, his right leg he uses a brace on teh ankle/foot but still gives out on him.  He uses a cane currently right now, but is very slow to move.   He wants to see if he would qualify for electric mobility chair.  Social Hx: never smoker  Review of Systems   See HPI for ROS.   Past medical history, surgical, family, and social history reviewed and updated in the EMR as appropriate. Objective:  BP 190/55 mmHg  Pulse 60  Temp(Src) 99.2 F (37.3 C) (Oral)  Ht 5\' 7"  (1.702 m)  Wt 252 lb (114.306 kg)  BMI 39.46 kg/m2  SpO2 98% Vitals and nursing note reviewed  General: no apparent distress  CV: normal rate, regular rhythm, no murmurs, rubs or gallop appreciated Resp: clear to auscultation bilaterally, normal effort Extremities: there is a brace on his right ankle Neuro: alert and oriented. He has significant weakness of his right hand/arm, right leg. Uses walker for ambulation which is slow.   Assessment & Plan:  Essential hypertension, benign Still not at goal. Add on spironolactone 50mg  daily. Discussed risks of potassium imbalances and need for repeat labwork in next 2 weeks; also discussed gynecomastia side effect though more common at higher doses. Follow up 2 weeks with either nurse BP check or clinic visit with myself.  Hemiparesis affecting right side as late effect of stroke ([place]) Fall risk from weakness  related to stroke. Patient interested in adaptive equipment if he is eligible. Discussed possible referral for this evaluation which I told him I would discuss with attendings and order if indicated.   Return in about 2 weeks (around 11/06/2015).

## 2015-10-23 NOTE — Assessment & Plan Note (Signed)
Still not at goal. Add on spironolactone 50mg  daily. Discussed risks of potassium imbalances and need for repeat labwork in next 2 weeks; also discussed gynecomastia side effect though more common at higher doses. Follow up 2 weeks with either nurse BP check or clinic visit with myself.

## 2015-11-02 ENCOUNTER — Ambulatory Visit: Payer: Medicare Other | Attending: Family Medicine | Admitting: Rehabilitative and Restorative Service Providers"

## 2015-11-02 DIAGNOSIS — R2681 Unsteadiness on feet: Secondary | ICD-10-CM | POA: Diagnosis not present

## 2015-11-02 DIAGNOSIS — M6281 Muscle weakness (generalized): Secondary | ICD-10-CM | POA: Diagnosis not present

## 2015-11-02 DIAGNOSIS — R2689 Other abnormalities of gait and mobility: Secondary | ICD-10-CM | POA: Diagnosis not present

## 2015-11-02 NOTE — Therapy (Signed)
[facility] [address] [place], [place], [address] Phone: [phone]   Fax:  [phone]  Physical Therapy Evaluation  Patient Details  Name: [name] MRN: [id number] Date of Birth: January 11, 1951 Referring Provider: Tawanna Sat, MD  Encounter Date: 11/02/2015      PT End of Session - 11/02/15 1556    Visit Number 1   Number of Visits 12   Date for PT Re-Evaluation 01/01/16   Authorization Type G code every 10th visit   PT Start Time 0850   PT Stop Time 0930   PT Time Calculation (min) 40 min   Equipment Utilized During Treatment Gait belt   Activity Tolerance Patient tolerated treatment well   Behavior During Therapy [facility] for tasks assessed/performed      Past Medical History  Diagnosis Date  . Hypertension   . Diabetes mellitus   . CVA (cerebral infarction) 01/03/2008    MRI: Acute 1 x 1.5 cm infarction affecting the left side of the pons.  . Stroke ([place])   . DVT (deep venous thrombosis) [facility])     Past Surgical History  Procedure Laterality Date  . None      There were no vitals filed for this visit.       Subjective Assessment - 11/02/15 0902    Subjective The patient reports h/o CVA in 2008.  He reports he sustained 3 falls last year with multiple near falls.  He rpeorts his right side is still weak from the stroke.  The patient's main mobility needs are household.  He is also interested in discussing power w/c for household mobility.     Patient Stated Goals consider power w/c and work on balance/walking.     Currently in Pain? No/denies            [facility] PT Assessment - 11/02/15 0859    Assessment   Medical Diagnosis h/o stroke, recent falls   Referring Provider Tawanna Sat, MD   Onset Date/Surgical Date 10/23/15   Hand Dominance Right  uses L due to hemiparesis   Prior Therapy h/o stroke with prior PT   Precautions   Precautions Fall   Required Braces or Orthoses Other  Brace/Splint   Other Brace/Splint AFO on right LE using metal upright   Restrictions   Weight Bearing Restrictions No   Balance Screen   Has the patient fallen in the past 6 months Yes   Has the patient had a decrease in activity level because of a fear of falling?  Yes   Is the patient reluctant to leave their home because of a fear of falling?  Yes   [place] residence   Living Arrangements Alone  family checks in on often   Type of [place] - quad;Shower seat;Grab bars - tub/shower   Prior Function   Level of Independence Independent with basic ADLs;Requires assistive device for independence  h/o CVA 2008, deficits since that time; family does grocery   Vocation On disability   Observation/Other Assessments   Focus on Therapeutic Outcomes (FOTO)  40%   Other Surveys  --  Neuro QOL=32.4%   Sensation   Light Touch Impaired by gross assessment  R side with diminished sensation per report   Posture/Postural Control   Posture/Postural Control Postural limitations   Postural Limitations Rounded Shoulders;Forward head   ROM /  Strength   AROM / PROM / Strength AROM;Strength   AROM   Overall AROM  Deficits   Overall AROM Comments R UE deficits with AROM due to weakness/hemiparesis and AAROM.  R LE appears tight in hip flexors, hamstrings and heel cords from h/o hemiparesis   Strength   Overall Strength Deficits   Overall Strength Comments L UE is 5/5 shoulder flexion/abduction, elbow flexion/extension.  Left hip is 4/5, L knee is 5/5 and L ankle is 5/5 for dorsiflexion.  R hip is 2/5, R knee is 2/5 knee extension and flexion and 2/5 ankle df   Ambulation/Gait   Ambulation/Gait Yes   Ambulation/Gait Assistance 6: Modified independent (Device/Increase time)   Ambulation Distance (Feet) 100 Feet   Assistive device Large base quad cane   Gait Pattern Decreased stride  length;Decreased stance time - right;Decreased dorsiflexion - right;Decreased weight shift to right;Right circumduction;Poor foot clearance - right   Ambulation Surface Level   Gait velocity 1.02 ft/sec   Stairs Yes   Stairs Assistance 4: Min assist   Stair Management Technique One rail Left;Step to pattern   Number of Stairs 4   Gait Comments The patient has 3 falls and reports 1.5 hours to get up from floor.   Standardized Balance Assessment   Standardized Balance Assessment Berg Balance Test   Berg Balance Test   Sit to Stand Able to stand  independently using hands   Standing Unsupported Able to stand safely 2 minutes   Sitting with Back Unsupported but Feet Supported on Floor or Stool Able to sit safely and securely 2 minutes   Stand to Sit Controls descent by using hands   Transfers Able to transfer safely, definite need of hands   Standing Unsupported with Eyes Closed Able to stand 10 seconds with supervision   Standing Ubsupported with Feet Together Able to place feet together independently and stand for 1 minute with supervision   From Standing, Reach Forward with Outstretched Arm Can reach forward >5 cm safely (2")   From Standing Position, Pick up Object from Floor Unable to pick up and needs supervision   From Standing Position, Turn to Look Behind Over each Shoulder Needs supervision when turning   Turn 360 Degrees Needs assistance while turning   Standing Unsupported, Alternately Place Feet on Step/Stool Needs assistance to keep from falling or unable to try   Standing Unsupported, One Foot in Front Loses balance while stepping or standing   Standing on One Leg Unable to try or needs assist to prevent fall   Total Score 27   Berg comment: 27/56 indicating high fall risk               PT Education - 11/02/15 1555    Education provided Yes   Education Details PT plan of care/goals   Person(s) Educated Patient   Methods Explanation   Comprehension Verbalized  understanding          PT Short Term Goals - 11/02/15 1557    PT SHORT TERM GOAL #1   Title The patient will return demo HEP with written cues.   Baseline Target date 12/02/2015   Time 4   Period Weeks   PT SHORT TERM GOAL #2   Title The patient will improve Berg from 27/56 to > or equal to 33/56 to demo dec'ing risk for falls.   Baseline Target date 12/02/2015   Time 4   Period Weeks   PT SHORT TERM GOAL #3   Title  The patient will improve gait speed from 1.02 ft/sec to > or equal to 1.3 ft/sec to transition to "limited community ambulator"   Baseline Target date 12/02/2015   Time 4   Period Weeks   PT SHORT TERM GOAL #4   Title The patient will negotiate 4 steps with step to pattern and one handrail with supervision    Baseline Target date 12/02/2015   Time 4   Period Weeks           PT Long Term Goals - 11/07/15 1600    PT LONG TERM GOAL #1   Title The patient will be indep with progression of HEP for post d/c.   Baseline Target date 01/01/2016   Time 8   Period Weeks   PT LONG TERM GOAL #2   Title The patient will improve Berg from 27/56 to > or equa lto 36/56 to demo dec'd risk for falls.   Baseline Target date 01/01/2016   Time 8   Period Weeks   PT LONG TERM GOAL #3   Title The patient will improve functional status score from 40% to > or equal to 50% for improved self perception of mobility.   Baseline Target date 01/01/2016   Time 8   Period Weeks   PT LONG TERM GOAL #4   Title The aptient will improve gait speed from 1.02 ft/sec to > orequal to 1.6 ft/sec.   Baseline Target date 01/01/2016   Time 8   Period Weeks   PT LONG TERM GOAL #5   Title The patient will have further equipment needs addressed, as indicated (w/c, AFO).   Baseline Target date 01/01/2016   Time 8   Period Weeks               Plan - 2015-11-07 1602    Clinical Impression Statement The patient is a 65 year old male that presents to therapy for gait, balance training, and to also consider  equipment needs.  He inquires about power w/c.  Due to left UE/LE strength, he may be able to propel a manual w/c safely for household distances, which would make him  ineligible for power mobility.  PT to further assess in treatment sessions and make recommendations as appropriate.  PT to address mobility issues related to falls to oprimize current status.   Rehab Potential Good   PT Frequency 2x / week   PT Duration 4 weeks  followed by 1x/week for 4 weeks   PT Treatment/Interventions ADLs/Self Care Home Management;Balance training;Neuromuscular re-education;Gait training;Patient/family education;Therapeutic activities;Therapeutic exercise;Functional mobility training;Stair training;Orthotic Fit/Training;Wheelchair mobility training   PT Next Visit Plan Begin with HEP for strength, balance, consider manual w/c with L propel, AFO assessment   Consulted and Agree with Plan of Care Patient      Patient will benefit from skilled therapeutic intervention in order to improve the following deficits and impairments:  Abnormal gait, Decreased balance, Decreased strength, Difficulty walking, Impaired tone, Decreased endurance, Decreased mobility  Visit Diagnosis: Other abnormalities of gait and mobility  Muscle weakness (generalized)  Unsteadiness on feet      G-Codes - 2015/11/07 1605    Functional Assessment Tool Used Berg=27/56   Functional Limitation Mobility: Walking and moving around   Mobility: Walking and Moving Around Current Status [phone]) At least 40 percent but less than 60 percent impaired, limited or restricted   Mobility: Walking and Moving Around Goal Status [id number]) At least 20 percent but less than 40 percent impaired, limited or restricted  Problem List Patient Active Problem List   Diagnosis Date Noted  . Hemiparesis affecting right side as late effect of stroke ([place]) 10/23/2015  . PAD (peripheral artery disease) ([place]) 10/20/2011  . Hyperlipidemia 02/21/2010  .  CEREBROVASCULAR ACCIDENT, HX OF 12/22/2008  . Diabetes mellitus, type II ([place]) 07/30/2006  . OBESITY, NOS 07/30/2006  . Essential hypertension, benign 07/30/2006    Linnell Swords , PT  11/02/2015, 4:05 PM  [facility] [address] [place], [place], [address] Phone: [phone]   Fax:  [phone]  Name: [name] MRN: [id number] Date of Birth: 10/29/1950

## 2015-11-05 ENCOUNTER — Ambulatory Visit (INDEPENDENT_AMBULATORY_CARE_PROVIDER_SITE_OTHER): Payer: Medicare Other | Admitting: Family Medicine

## 2015-11-05 ENCOUNTER — Encounter: Payer: Self-pay | Admitting: Family Medicine

## 2015-11-05 VITALS — BP 150/78 | HR 63 | Temp 97.6°F | Ht 67.0 in | Wt 253.0 lb

## 2015-11-05 DIAGNOSIS — I10 Essential (primary) hypertension: Secondary | ICD-10-CM | POA: Diagnosis not present

## 2015-11-05 NOTE — Progress Notes (Signed)
   Subjective:    Patient ID: ORANGE GELIN, male    DOB: 1951-05-15, 65 y.o.   MRN: [id number]  HPI  CC: high blood pressure follow up   # Hypertension:  No complaints with new medicine  Denies any adverse effects  Denies any pain, no headaches  Does not measure BP at home or at pharmacy ROS: not lightheaded, no CP, no SOB, no changes in voiding  Social Hx: never smoker  Review of Systems   See HPI for ROS.   Past medical history, surgical, family, and social history reviewed and updated in the EMR as appropriate. Objective:  BP 150/78 mmHg  Pulse 63  Temp(Src) 97.6 F (36.4 C) (Oral)  Ht 5\' 7"  (1.702 m)  Wt 253 lb (114.76 kg)  BMI 39.62 kg/m2 Vitals and nursing note reviewed  General: no apparent distress  CV: normal rate, regular rhythm, no murmurs, rubs or gallop  Resp: clear to auscultation bilaterally, normal effort  Assessment & Plan:  Essential hypertension, benign Initial check elevated, but re-check just meets goal BP. His BMP checked showed normal potassium levels (monitoring due to spironolactone + lisinopril). Collected plasma renin/aldosterone labs however they were unrevealing (though to be of best use should be done while off spironolactone and lisinopril). Will have him return in 1 month for re-check.     Return in about 1 month (around 12/05/2015).

## 2015-11-05 NOTE — Patient Instructions (Signed)
Keep the medicines the same for right now.  I will call you if we are changing the medicines after I get the lab results back.

## 2015-11-06 LAB — BASIC METABOLIC PANEL WITH GFR
BUN: 23 mg/dL (ref 7–25)
CALCIUM: 8.6 mg/dL (ref 8.6–10.3)
CO2: 25 mmol/L (ref 20–31)
Chloride: 106 mmol/L (ref 98–110)
Creat: 1.02 mg/dL (ref 0.70–1.25)
GFR, EST AFRICAN AMERICAN: 89 mL/min (ref >=60)
GFR, EST NON AFRICAN AMERICAN: 77 mL/min (ref >=60)
GLUCOSE: 80 mg/dL (ref 65–99)
Potassium: 4.5 mmol/L (ref 3.5–5.3)
SODIUM: 138 mmol/L (ref 135–146)

## 2015-11-08 ENCOUNTER — Ambulatory Visit: Payer: Medicare Other | Admitting: Rehabilitative and Restorative Service Providers"

## 2015-11-09 ENCOUNTER — Ambulatory Visit: Payer: Medicare Other | Admitting: Rehabilitation

## 2015-11-09 ENCOUNTER — Encounter: Payer: Self-pay | Admitting: Rehabilitation

## 2015-11-09 DIAGNOSIS — R2689 Other abnormalities of gait and mobility: Secondary | ICD-10-CM | POA: Diagnosis not present

## 2015-11-09 DIAGNOSIS — R2681 Unsteadiness on feet: Secondary | ICD-10-CM

## 2015-11-09 DIAGNOSIS — M6281 Muscle weakness (generalized): Secondary | ICD-10-CM

## 2015-11-09 NOTE — Therapy (Signed)
[facility] [address] [place], [place], [address] Phone: [phone]   Fax:  [phone]  Physical Therapy Treatment  Patient Details  Name: [name] MRN: [id number] Date of Birth: [birth year] Referring Provider: Tawanna Sat, MD  Encounter Date: 11/09/2015      PT End of Session - 11/09/15 1337    Visit Number 2   Number of Visits 12   Date for PT Re-Evaluation 01/01/16   Authorization Type G code every 10th visit   PT Start Time [phone]  late due to scheduling conflict   PT Stop Time [id number]   PT Time Calculation (min) 39 min   Equipment Utilized During Treatment Gait belt   Activity Tolerance Patient tolerated treatment well   Behavior During Therapy [facility] for tasks assessed/performed      Past Medical History  Diagnosis Date  . Hypertension   . Diabetes mellitus   . CVA (cerebral infarction) 01/03/2008    MRI: Acute 1 x 1.5 cm infarction affecting the left side of the pons.  . Stroke ([place])   . DVT (deep venous thrombosis) [facility])     Past Surgical History  Procedure Laterality Date  . None      There were no vitals filed for this visit.      Subjective Assessment - 11/09/15 1333    Subjective Pt reports no changes since last visit, no falls.    Patient Stated Goals consider power w/c and work on balance/walking.     Currently in Pain? No/denies           W/c mgmt:  Pt able to self propel manual w/c with LUE/LE at S level x 75' during session.  Cues for sequencing and technique.  Note that pt fatigues quickly and requires increased time to complete task.  Discussed power chair vs manual chair and this PT unsure if pt qualifies due to ability to manually propel chair.  This PT to discuss with primary PT to discuss appropriateness.    Gait:  Assessed gait with use of LBQC and current personal AFO x 50' at S to min/guard level (for cues and facilitation).  Provided light tactile facilitation at  trunk for upright posture and at R hip for increased protraction during R stance phase of gait.    Ortho check:  Donned R ottobock reaction AFO to begin to assess appropriateness for new AFO.  Note that current AFO heavy and pt seems to fatigue quickly.  Ambulated x 47' with clinic AFO at min/guard level, again for cues and facilitation for posture and increased R hip protraction.  Did not note marked difference in gait quality however pt did report that brace felt lighter and easier to advance RLE.  Will continue to assess braces during future therapy sessions.                        PT Education - 11/09/15 1336    Education provided Yes   Education Details education on power vs manual w/c, new brace   Person(s) Educated Patient   Methods Explanation;Demonstration   Comprehension Verbalized understanding;Returned demonstration          PT Short Term Goals - 11/02/15 1557    PT SHORT TERM GOAL #1   Title The patient will return demo HEP with written cues.   Baseline Target date 12/02/2015   Time 4   Period Weeks   PT SHORT TERM GOAL #2  Title The patient will improve Berg from 27/56 to > or equal to 33/56 to demo dec'ing risk for falls.   Baseline Target date 12/02/2015   Time 4   Period Weeks   PT SHORT TERM GOAL #3   Title The patient will improve gait speed from 1.02 ft/sec to > or equal to 1.3 ft/sec to transition to "limited community ambulator"   Baseline Target date 12/02/2015   Time 4   Period Weeks   PT SHORT TERM GOAL #4   Title The patient will negotiate 4 steps with step to pattern and one handrail with supervision    Baseline Target date 12/02/2015   Time 4   Period Weeks           PT Long Term Goals - 11/02/15 1600    PT LONG TERM GOAL #1   Title The patient will be indep with progression of HEP for post d/c.   Baseline Target date 01/01/2016   Time 8   Period Weeks   PT LONG TERM GOAL #2   Title The patient will improve Berg from 27/56 to > or  equa lto 36/56 to demo dec'd risk for falls.   Baseline Target date 01/01/2016   Time 8   Period Weeks   PT LONG TERM GOAL #3   Title The patient will improve functional status score from 40% to > or equal to 50% for improved self perception of mobility.   Baseline Target date 01/01/2016   Time 8   Period Weeks   PT LONG TERM GOAL #4   Title The aptient will improve gait speed from 1.02 ft/sec to > orequal to 1.6 ft/sec.   Baseline Target date 01/01/2016   Time 8   Period Weeks   PT LONG TERM GOAL #5   Title The patient will have further equipment needs addressed, as indicated (w/c, AFO).   Baseline Target date 01/01/2016   Time 8   Period Weeks               Plan - 11/09/15 1337    Clinical Impression Statement Skilled session focused on w/c mobility using LUE/LE to assess whether power w/c needed, will discuss with primary PT.  Also performed gait with use of R ottobock reaction AFO in order to assess effectiveness of lighter brace.  Pt reports that brace feels better, however PT concerned with possible skin issues (brace had left indention on LE due to swelling).  Will  continue to address in future sessions.    Rehab Potential Good   PT Frequency 2x / week   PT Duration 4 weeks  followed by 1x/week for 4 weeks   PT Treatment/Interventions ADLs/Self Care Home Management;Balance training;Neuromuscular re-education;Gait training;Patient/family education;Therapeutic activities;Therapeutic exercise;Functional mobility training;Stair training;Orthotic Fit/Training;Wheelchair mobility training   PT Next Visit Plan Begin with HEP for strength, balance, (Christina he was able to propel w/c with LUE/LE-not very efficiently, however don't think he would qualify for power w/c?, also unsure that new brace would improve gait due to swelling and DM)   Consulted and Agree with Plan of Care Patient      Patient will benefit from skilled therapeutic intervention in order to improve the following  deficits and impairments:  Abnormal gait, Decreased balance, Decreased strength, Difficulty walking, Impaired tone, Decreased endurance, Decreased mobility  Visit Diagnosis: Other abnormalities of gait and mobility  Muscle weakness (generalized)  Unsteadiness on feet     Problem List Patient Active Problem List   Diagnosis  Date Noted  . Hemiparesis affecting right side as late effect of stroke ([place]) 10/23/2015  . PAD (peripheral artery disease) ([place]) 10/20/2011  . Hyperlipidemia 02/21/2010  . CEREBROVASCULAR ACCIDENT, HX OF 12/22/2008  . Diabetes mellitus, type II ([place]) 07/30/2006  . OBESITY, NOS 07/30/2006  . Essential hypertension, benign 07/30/2006    Cameron Sprang, PT, MPT [facility] [address] [place], [place], [address] Phone: [phone]   Fax:  [phone] 11/09/2015, 1:41 PM  Name: [name] MRN: [id number] Date of Birth: 09-Jul-1950

## 2015-11-10 LAB — ALDOSTERONE + RENIN ACTIVITY W/ RATIO
ALDO / PRA RATIO: 76.2 ratio — AB (ref 0.9–28.9)
ALDOSTERONE: 16 ng/dL
PRA LC/MS/MS: 0.21 ng/mL/h — ABNORMAL LOW (ref 0.25–5.82)

## 2015-11-12 ENCOUNTER — Ambulatory Visit: Payer: Medicare Other | Admitting: Rehabilitative and Restorative Service Providers"

## 2015-11-12 DIAGNOSIS — R2689 Other abnormalities of gait and mobility: Secondary | ICD-10-CM

## 2015-11-12 DIAGNOSIS — R2681 Unsteadiness on feet: Secondary | ICD-10-CM | POA: Diagnosis not present

## 2015-11-12 DIAGNOSIS — M6281 Muscle weakness (generalized): Secondary | ICD-10-CM | POA: Diagnosis not present

## 2015-11-12 NOTE — Patient Instructions (Signed)
Functional Quadriceps: Sit to Stand    Sit on edge of chair, feet flat on floor. Stand upright, extending knees fully. Repeat __10__ times per set. Do _1___ sets per session. Do __2__ sessions per day.  http://orth.exer.us/734   Copyright  VHI. All rights reserved.   AMBULATION: Side Step    Step sideways. Repeat in opposite direction. __10_ reps per set,  2_ sets per day. Use countertop for support  Copyright  VHI. All rights reserved.   Mini Squat: Double Leg    With feet shoulder width apart, reach forward for balance and do a mini squat. Keep knees in line with second toe. Knees do not go past toes. Repeat _10__ times per set.  Do __2_ sets per session.  http://plyo.exer.us/70   Copyright  VHI. All rights reserved.  Clam Shell 45 Degrees    Lying with hips and knees bent 45, one pillow between knees and ankles. Lift knee. Be sure pelvis does not roll backward. Do not arch back. Do __10_ times, each leg, _2__ times per day.  http://ss.exer.us/74   Copyright  VHI. All rights reserved.  Knee to Chest: Transverse Plane Stability    Bring one knee up, then return. Be sure pelvis does not roll side to side. Keep pelvis still. Lift knee _10__ times. Restabilize pelvis. Repeat with other leg. Do _1__ sets, _2__ times per day.  http://ss.exer.us/6   Copyright  VHI. All rights reserved.

## 2015-11-12 NOTE — Therapy (Signed)
[name] [address] [name], [place], [address] Phone: [phone]   Fax:  [phone]  Physical Therapy Treatment  Patient Details  Name: [name] MRN: [id number] Date of Birth: [birth year] Referring Provider: Tawanna Sat, MD  Encounter Date: 11/12/2015      PT End of Session - 11/12/15 1122    Visit Number 3   Number of Visits 12   Date for PT Re-Evaluation 01/01/16   Authorization Type G code every 10th visit   PT Start Time 1120   PT Stop Time 1200   PT Time Calculation (min) 40 min   Equipment Utilized During Treatment Gait belt   Activity Tolerance Patient tolerated treatment well   Behavior During Therapy [facility] for tasks assessed/performed      Past Medical History  Diagnosis Date  . Hypertension   . Diabetes mellitus   . CVA (cerebral infarction) 01/03/2008    MRI: Acute 1 x 1.5 cm infarction affecting the left side of the pons.  . Stroke ([place])   . DVT (deep venous thrombosis) [facility])     Past Surgical History  Procedure Laterality Date  . None      There were no vitals filed for this visit.      Subjective Assessment - 11/12/15 1119    Subjective Patient fatigued after therapy on Friday.   Patient Stated Goals consider power w/c and work on balance/walking.     Currently in Pain? No/denies      THERAPEUTIC EXERCISE: Standing Mini squats with UE support at countertop x 10 reps Sidestepping with cues on R foot pointing forward vs abducted/ERx 5 feet x 3 reps each direction Attempted marching with difficulty lifting R LE x 3 reps  Supine Bridges x 8 repetitions with cues on hip extension Supine marching x 8 reps bilaterally with increased effort to lift R hip  Sidelying Clamshell x 8 reps    Sit<>stand x 10 reps dec'ing UE support  Seated hamstring stretch x R and L sides x 20 second holds  Standing heel cord stretch with R leg posterior and cues for hip remaining  anterior  Gait: Ambulation x 115 feet with LBQC and cues on R hip anterior positioning, 75 feet, 100 feet with cues on longer step length       PT Short Term Goals - 11/02/15 1557    PT SHORT TERM GOAL #1   Title The patient will return demo HEP with written cues.   Baseline Target date 12/02/2015   Time 4   Period Weeks   PT SHORT TERM GOAL #2   Title The patient will improve Berg from 27/56 to > or equal to 33/56 to demo dec'ing risk for falls.   Baseline Target date 12/02/2015   Time 4   Period Weeks   PT SHORT TERM GOAL #3   Title The patient will improve gait speed from 1.02 ft/sec to > or equal to 1.3 ft/sec to transition to "limited community ambulator"   Baseline Target date 12/02/2015   Time 4   Period Weeks   PT SHORT TERM GOAL #4   Title The patient will negotiate 4 steps with step to pattern and one handrail with supervision    Baseline Target date 12/02/2015   Time 4   Period Weeks           PT Long Term Goals - 11/02/15 1600    PT LONG TERM GOAL #1   Title The patient  will be indep with progression of HEP for post d/c.   Baseline Target date 01/01/2016   Time 8   Period Weeks   PT LONG TERM GOAL #2   Title The patient will improve Berg from 27/56 to > or equa lto 36/56 to demo dec'd risk for falls.   Baseline Target date 01/01/2016   Time 8   Period Weeks   PT LONG TERM GOAL #3   Title The patient will improve functional status score from 40% to > or equal to 50% for improved self perception of mobility.   Baseline Target date 01/01/2016   Time 8   Period Weeks   PT LONG TERM GOAL #4   Title The aptient will improve gait speed from 1.02 ft/sec to > orequal to 1.6 ft/sec.   Baseline Target date 01/01/2016   Time 8   Period Weeks   PT LONG TERM GOAL #5   Title The patient will have further equipment needs addressed, as indicated (w/c, AFO).   Baseline Target date 01/01/2016   Time 8   Period Weeks               Plan - 11/12/15 1122    Clinical  Impression Statement Today's session emphasizing HEP performance to improve R LE strength, mobility and balance.  Patient able to perform with cues.    Rehab Potential Good   PT Frequency 2x / week   PT Duration 4 weeks  followed by 1x/week for 4 weeks   PT Treatment/Interventions ADLs/Self Care Home Management;Balance training;Neuromuscular re-education;Gait training;Patient/family education;Therapeutic activities;Therapeutic exercise;Functional mobility training;Stair training;Orthotic Fit/Training;Wheelchair mobility training   PT Next Visit Plan Check HEP, manual w/c propulsion L UE//LE, consider different laces to make current brace more effective   Consulted and Agree with Plan of Care Patient      Patient will benefit from skilled therapeutic intervention in order to improve the following deficits and impairments:  Abnormal gait, Decreased balance, Decreased strength, Difficulty walking, Impaired tone, Decreased endurance, Decreased mobility  Visit Diagnosis: Other abnormalities of gait and mobility  Muscle weakness (generalized)  Unsteadiness on feet     Problem List Patient Active Problem List   Diagnosis Date Noted  . Hemiparesis affecting right side as late effect of stroke ([place]) 10/23/2015  . PAD (peripheral artery disease) ([place]) 10/20/2011  . Hyperlipidemia 02/21/2010  . CEREBROVASCULAR ACCIDENT, HX OF 12/22/2008  . Diabetes mellitus, type II ([place]) 07/30/2006  . OBESITY, NOS 07/30/2006  . Essential hypertension, benign 07/30/2006    [name], PT 11/12/2015, 12:16 PM  [facility] [address] [place], [place], [address] Phone: [phone]   Fax:  [phone]  Name: [name] MRN: [id number] Date of Birth: 1950-08-03

## 2015-11-13 NOTE — Assessment & Plan Note (Addendum)
Initial check elevated, but re-check just meets goal BP. His BMP checked showed normal potassium levels (monitoring due to spironolactone + lisinopril). Collected plasma renin/aldosterone labs however they were unrevealing (though to be of best use should be done while off spironolactone and lisinopril). Will have him return in 1 month for re-check.

## 2015-11-14 ENCOUNTER — Telehealth: Payer: Self-pay | Admitting: *Deleted

## 2015-11-14 MED ORDER — TETANUS-DIPHTH-ACELL PERTUSSIS 5-2.5-18.5 LF-MCG/0.5 IM SUSP: 0.5000 mL | Freq: Once | INTRAMUSCULAR | Status: DC

## 2015-11-14 NOTE — Telephone Encounter (Signed)
Pt states that he never picked this up and the pharmacy did not have the RX anymore (from 10/23/15)  Resent to [place], [place], [place]

## 2015-11-15 ENCOUNTER — Encounter: Payer: Self-pay | Admitting: Physical Therapy

## 2015-11-15 ENCOUNTER — Ambulatory Visit: Payer: Medicare Other | Admitting: Physical Therapy

## 2015-11-15 DIAGNOSIS — R2681 Unsteadiness on feet: Secondary | ICD-10-CM | POA: Diagnosis not present

## 2015-11-15 DIAGNOSIS — R2689 Other abnormalities of gait and mobility: Secondary | ICD-10-CM

## 2015-11-15 DIAGNOSIS — M6281 Muscle weakness (generalized): Secondary | ICD-10-CM | POA: Diagnosis not present

## 2015-11-16 DIAGNOSIS — Z23 Encounter for immunization: Secondary | ICD-10-CM | POA: Diagnosis not present

## 2015-11-17 NOTE — Therapy (Signed)
[facility] [address] [place], [place], [address] Phone: [phone]   Fax:  [phone]  Physical Therapy Treatment  Patient Details  Name: [name] MRN: [id number] Date of Birth: 1951-01-14 Referring Provider: Tawanna Sat, MD  Encounter Date: 11/15/2015   11/15/15 1327  PT Visits / Re-Eval  Visit Number 4  Number of Visits 12  Date for PT Re-Evaluation 01/01/16  Authorization  Authorization Type G code every 10th visit  PT Time Calculation  PT Start Time 1318  PT Stop Time 1400  PT Time Calculation (min) 42 min  PT - End of Session  Equipment Utilized During Treatment Gait belt  Activity Tolerance Patient tolerated treatment well  Behavior During Therapy [facility] for tasks assessed/performed     Past Medical History  Diagnosis Date  . Hypertension   . Diabetes mellitus   . CVA (cerebral infarction) 01/03/2008    MRI: Acute 1 x 1.5 cm infarction affecting the left side of the pons.  . Stroke ([place])   . DVT (deep venous thrombosis) [facility])     Past Surgical History  Procedure Laterality Date  . None      There were no vitals filed for this visit.     11/15/15 1325  Symptoms/Limitations  Subjective Pt reports feeling some muscle soreness after last session.   Patient Stated Goals consider power w/c and work on balance/walking.    Pain Assessment  Currently in Pain? No/denies  Pain Score 0      11/15/15 1333  Knee/Hip Exercises: Seated  Long Arc Quad Strengthening;Right;AROM;1 set;10 reps;Limitations  Long [employer] Limitations cues on posture to decrease posterior lean  Marching AROM;Strengthening;Right;1 set;10 reps;Limitations  Marching Limitations cues on form and technique, limited range noted with exercises  Knee/Hip Exercises: Supine  Bridges Strengthening;Both;1 set;10 reps;Limitations  Bridges Limitations cues for increased pelvic lift and hold times  Straight Leg Raises  AAROM;Strengthening;Right;10 reps;2 sets;Limitations  Straight Leg Raises Limitations assist needed to complete the lift and to maintain knee control  Other Supine Knee/Hip Exercises alternating marching x10 reps each leg, cues on abdominal bracing  Other Supine Knee/Hip Exercises with red band around legs just above knees: hip abduction/ER with feet together, 5 sec holds x 10 reps  Knee/Hip Exercises: Sidelying  Hip ABduction AAROM;Strengthening;Right;1 set;10 reps;Limitations  Hip ABduction Limitations assist needed for form and to complete exercise  Clams right leg x 10 reps with cues on form/technique and manual assistance for pelvic positioning.             PT Short Term Goals - 11/02/15 1557    PT SHORT TERM GOAL #1   Title The patient will return demo HEP with written cues.   Baseline Target date 12/02/2015   Time 4   Period Weeks   PT SHORT TERM GOAL #2   Title The patient will improve Berg from 27/56 to > or equal to 33/56 to demo dec'ing risk for falls.   Baseline Target date 12/02/2015   Time 4   Period Weeks   PT SHORT TERM GOAL #3   Title The patient will improve gait speed from 1.02 ft/sec to > or equal to 1.3 ft/sec to transition to "limited community ambulator"   Baseline Target date 12/02/2015   Time 4   Period Weeks   PT SHORT TERM GOAL #4   Title The patient will negotiate 4 steps with step to pattern and one handrail with supervision    Baseline Target date  12/02/2015   Time 4   Period Weeks           PT Long Term Goals - 11/02/15 1600    PT LONG TERM GOAL #1   Title The patient will be indep with progression of HEP for post d/c.   Baseline Target date 01/01/2016   Time 8   Period Weeks   PT LONG TERM GOAL #2   Title The patient will improve Berg from 27/56 to > or equa lto 36/56 to demo dec'd risk for falls.   Baseline Target date 01/01/2016   Time 8   Period Weeks   PT LONG TERM GOAL #3   Title The patient will improve functional status score from 40%  to > or equal to 50% for improved self perception of mobility.   Baseline Target date 01/01/2016   Time 8   Period Weeks   PT LONG TERM GOAL #4   Title The aptient will improve gait speed from 1.02 ft/sec to > orequal to 1.6 ft/sec.   Baseline Target date 01/01/2016   Time 8   Period Weeks   PT LONG TERM GOAL #5   Title The patient will have further equipment needs addressed, as indicated (w/c, AFO).   Baseline Target date 01/01/2016   Time 8   Period Weeks        11/15/15 1327  Plan  Clinical Impression Statement Today's session focused on right leg strengthening with no issues reported during session. Pt is making steady progress toward goals.  Pt will benefit from skilled therapeutic intervention in order to improve on the following deficits Abnormal gait;Decreased balance;Decreased strength;Difficulty walking;Impaired tone;Decreased endurance;Decreased mobility  Rehab Potential Good  PT Frequency 2x / week  PT Duration 4 weeks (followed by 1x/week for 4 weeks)  PT Treatment/Interventions ADLs/Self Care Home Management;Balance training;Neuromuscular re-education;Gait training;Patient/family education;Therapeutic activities;Therapeutic exercise;Functional mobility training;Stair training;Orthotic Fit/Training;Wheelchair mobility training  PT Next Visit Plan continue to work on strengthening, gait and balance toward goals  Consulted and Agree with Plan of Care Patient      Patient will benefit from skilled therapeutic intervention in order to improve the following deficits and impairments:  Abnormal gait, Decreased balance, Decreased strength, Difficulty walking, Impaired tone, Decreased endurance, Decreased mobility  Visit Diagnosis: Other abnormalities of gait and mobility  Muscle weakness (generalized)  Unsteadiness on feet     Problem List Patient Active Problem List   Diagnosis Date Noted  . Hemiparesis affecting right side as late effect of stroke ([place]) 10/23/2015  .  PAD (peripheral artery disease) ([place]) 10/20/2011  . Hyperlipidemia 02/21/2010  . CEREBROVASCULAR ACCIDENT, HX OF 12/22/2008  . Diabetes mellitus, type II ([place]) 07/30/2006  . OBESITY, NOS 07/30/2006  . Essential hypertension, benign 07/30/2006    [name], PTA, [facility] [address], [place], [place] [address] [phone] 11/17/2015, 8:21 PM   Name: [name] MRN: [id number] Date of Birth: Apr 23, 1951

## 2015-11-20 ENCOUNTER — Ambulatory Visit: Payer: Medicare Other | Admitting: Rehabilitative and Restorative Service Providers"

## 2015-11-20 DIAGNOSIS — R2681 Unsteadiness on feet: Secondary | ICD-10-CM

## 2015-11-20 DIAGNOSIS — M6281 Muscle weakness (generalized): Secondary | ICD-10-CM | POA: Diagnosis not present

## 2015-11-20 DIAGNOSIS — R2689 Other abnormalities of gait and mobility: Secondary | ICD-10-CM

## 2015-11-20 NOTE — Therapy (Signed)
[facility] [address] [place], [place], [address] Phone: [phone]   Fax:  [phone]  Physical Therapy Treatment  Patient Details  Name: [name] MRN: [id number] Date of Birth: 09/18/1950 Referring Provider: Tawanna Sat, MD  Encounter Date: 11/20/2015      PT End of Session - 11/20/15 1245    Visit Number 5   Number of Visits 12   Date for PT Re-Evaluation 01/01/16   Authorization Type G code every 10th visit   PT Start Time 1240   PT Stop Time 1320   PT Time Calculation (min) 40 min   Equipment Utilized During Treatment Gait belt   Activity Tolerance Patient tolerated treatment well   Behavior During Therapy [facility] for tasks assessed/performed      Past Medical History  Diagnosis Date  . Hypertension   . Diabetes mellitus   . CVA (cerebral infarction) 01/03/2008    MRI: Acute 1 x 1.5 cm infarction affecting the left side of the pons.  . Stroke ([place])   . DVT (deep venous thrombosis) [facility])     Past Surgical History  Procedure Laterality Date  . None      There were no vitals filed for this visit.      Subjective Assessment - 11/20/15 1244    Subjective Pt reports soreness after PT.  "I didn't even do my exercises" Friday, but then was able to do them over the weekend.    Patient Stated Goals consider power w/c and work on balance/walking.     Currently in Pain? No/denies      NEUROMUSCULAR RE-EDUCATION: Standing right weight shifting activities moving L LE onto/offof compliant surface with min A for support Stepping into/out of stride position during standing with L LE and CGA  Stepping up/down from treadmill with cues on weight shifting and min A   Gait: Ambulation with LBQC x 100 ft x 2 with cues on right weight shift and equal stride length Gait without device along countertop x 10 feet x 6 reps dec'ing hand held support, followed by 70 feet of ambulation with no device and CGA to  min A Ambulation with SPC with emphasis on equal stride length and upright posture x 115 ft x 3 reps  Ambulation on treadmill x 2.5 minutes with L UE support and CGA        PT Short Term Goals - 11/02/15 1557    PT SHORT TERM GOAL #1   Title The patient will return demo HEP with written cues.   Baseline Target date 12/02/2015   Time 4   Period Weeks   PT SHORT TERM GOAL #2   Title The patient will improve Berg from 27/56 to > or equal to 33/56 to demo dec'ing risk for falls.   Baseline Target date 12/02/2015   Time 4   Period Weeks   PT SHORT TERM GOAL #3   Title The patient will improve gait speed from 1.02 ft/sec to > or equal to 1.3 ft/sec to transition to "limited community ambulator"   Baseline Target date 12/02/2015   Time 4   Period Weeks   PT SHORT TERM GOAL #4   Title The patient will negotiate 4 steps with step to pattern and one handrail with supervision    Baseline Target date 12/02/2015   Time 4   Period Weeks           PT Long Term Goals - 11/02/15 1600  PT LONG TERM GOAL #1   Title The patient will be indep with progression of HEP for post d/c.   Baseline Target date 01/01/2016   Time 8   Period Weeks   PT LONG TERM GOAL #2   Title The patient will improve Berg from 27/56 to > or equa lto 36/56 to demo dec'd risk for falls.   Baseline Target date 01/01/2016   Time 8   Period Weeks   PT LONG TERM GOAL #3   Title The patient will improve functional status score from 40% to > or equal to 50% for improved self perception of mobility.   Baseline Target date 01/01/2016   Time 8   Period Weeks   PT LONG TERM GOAL #4   Title The aptient will improve gait speed from 1.02 ft/sec to > orequal to 1.6 ft/sec.   Baseline Target date 01/01/2016   Time 8   Period Weeks   PT LONG TERM GOAL #5   Title The patient will have further equipment needs addressed, as indicated (w/c, AFO).   Baseline Target date 01/01/2016   Time 8   Period Weeks               Plan -  11/20/15 1352    Clinical Impression Statement The patient demonstrated improved upright posture when ambulating with SPC or no device as compared to [facility].  He is using quad cane b/c it stands up easier in the kitchen vs a single point, which would fall over.  PT to continue to work on reciprocal pattern for gait to improve mechanics.   PT Treatment/Interventions ADLs/Self Care Home Management;Balance training;Neuromuscular re-education;Gait training;Patient/family education;Therapeutic activities;Therapeutic exercise;Functional mobility training;Stair training;Orthotic Fit/Training;Wheelchair mobility training   PT Next Visit Plan Start checking STGs, plan to do 1x/week after 4 weeks (discuss with patient and ensure appropriate).   Consulted and Agree with Plan of Care Patient      Patient will benefit from skilled therapeutic intervention in order to improve the following deficits and impairments:  Abnormal gait, Decreased balance, Decreased strength, Difficulty walking, Impaired tone, Decreased endurance, Decreased mobility  Visit Diagnosis: Other abnormalities of gait and mobility  Muscle weakness (generalized)  Unsteadiness on feet     Problem List Patient Active Problem List   Diagnosis Date Noted  . Hemiparesis affecting right side as late effect of stroke ([place]) 10/23/2015  . PAD (peripheral artery disease) ([place]) 10/20/2011  . Hyperlipidemia 02/21/2010  . CEREBROVASCULAR ACCIDENT, HX OF 12/22/2008  . Diabetes mellitus, type II ([place]) 07/30/2006  . OBESITY, NOS 07/30/2006  . Essential hypertension, benign 07/30/2006    Pheonix Clinkscale, PT 11/20/2015, 1:54 PM  [facility] [address] [place], [place], [address] Phone: [phone]   Fax:  [phone]  Name: [name] MRN: [id number] Date of Birth: Jun 02, 1951

## 2015-11-23 ENCOUNTER — Ambulatory Visit: Payer: Medicare Other | Admitting: Physical Therapy

## 2015-11-23 ENCOUNTER — Encounter: Payer: Self-pay | Admitting: Physical Therapy

## 2015-11-23 DIAGNOSIS — R2689 Other abnormalities of gait and mobility: Secondary | ICD-10-CM

## 2015-11-23 DIAGNOSIS — M6281 Muscle weakness (generalized): Secondary | ICD-10-CM | POA: Diagnosis not present

## 2015-11-23 DIAGNOSIS — R2681 Unsteadiness on feet: Secondary | ICD-10-CM

## 2015-11-26 ENCOUNTER — Ambulatory Visit: Payer: Medicare Other | Admitting: Rehabilitative and Restorative Service Providers"

## 2015-11-26 DIAGNOSIS — M6281 Muscle weakness (generalized): Secondary | ICD-10-CM | POA: Diagnosis not present

## 2015-11-26 DIAGNOSIS — R2689 Other abnormalities of gait and mobility: Secondary | ICD-10-CM | POA: Diagnosis not present

## 2015-11-26 DIAGNOSIS — R2681 Unsteadiness on feet: Secondary | ICD-10-CM | POA: Diagnosis not present

## 2015-11-26 NOTE — Therapy (Signed)
[facility] [address] [place], [place], [address] Phone: [phone]   Fax:  [phone]  Physical Therapy Treatment  Patient Details  Name: [name] MRN: [id number] Date of Birth: 02/12/1951 Referring Provider: Tawanna Sat, MD  Encounter Date: 11/26/2015      PT End of Session - 11/26/15 1152    Visit Number 7   Number of Visits 12   Date for PT Re-Evaluation 01/01/16   Authorization Type G code every 10th visit   PT Start Time 1150   PT Stop Time 1230   PT Time Calculation (min) 40 min   Equipment Utilized During Treatment Gait belt   Activity Tolerance Patient tolerated treatment well   Behavior During Therapy [facility] for tasks assessed/performed      Past Medical History  Diagnosis Date  . Hypertension   . Diabetes mellitus   . CVA (cerebral infarction) 01/03/2008    MRI: Acute 1 x 1.5 cm infarction affecting the left side of the pons.  . Stroke ([place])   . DVT (deep venous thrombosis) [facility])     Past Surgical History  Procedure Laterality Date  . None      There were no vitals filed for this visit.      Subjective Assessment - 11/26/15 1427    Subjective Patient doing HEP.  He does not wear AFO in the house- he walks with bedroom slippers on for lighterweight shoewear.   Patient Stated Goals consider power w/c and work on balance/walking.     Currently in Pain? No/denies      Gait: Ambulation with SPC (wider tip) x 175 feet x 3 reps with cues on reciprocal stepping pattern and increased speed of movement.  Gait speed today was 1.04 ft/sec.   Up/down steps with one handrail and supervision with demonstration on proper technique.  The patient met STG.  He does not use steps often due to having ramp at his home.  WHEELCHAIR MANAGEMENT: Patient tried propelling manual w/c using bilateral LEs with L arm assistance and then only L UE/LE.   He feels using bilat LEs + L arm is easiest for him PT  to consider/further discuss if this item is necessary for household mobility Patient's gait speed is diminished and he has variable performance and feels he would use manual w/c in the home.   THERAPEUTIC EXERCISE: Seated hamstring stretching x 2 reps each side.        PT Short Term Goals - 11/26/15 1210    PT SHORT TERM GOAL #1   Title The patient will return demo HEP with written cues.   Baseline Target date 12/02/2015   Time 4   Period Weeks   Status On-going   PT SHORT TERM GOAL #2   Title The patient will improve Berg from 27/56 to > or equal to 33/56 to demo dec'ing risk for falls.   Status Achieved   PT SHORT TERM GOAL #3   Title The patient will improve gait speed from 1.02 ft/sec to > or equal to 1.3 ft/sec to transition to "limited community ambulator"   Baseline Patient scores 1.04 ft/sec on 11/26/2015 with cues to move at a faster pace.   Status Partially Met   PT SHORT TERM GOAL #4   Title The patient will negotiate 4 steps with step to pattern and one handrail with supervision    Baseline Met on 11/26/2015   Status Achieved  PT Long Term Goals - 11/26/15 0827    PT LONG TERM GOAL #1   Title The patient will be indep with progression of HEP for post d/c.   Baseline Target date 01/01/2016   Time 8   Period Weeks   Status On-going   PT LONG TERM GOAL #2   Title The patient will improve Berg from 27/56 to > or equa lto 36/56 to demo dec'd risk for falls.   Baseline 11/23/15: 41/56 scored   Time --   Period --   Status Achieved   PT LONG TERM GOAL #3   Title The patient will improve functional status score from 40% to > or equal to 50% for improved self perception of mobility.   Baseline Target date 01/01/2016   Time 8   Period Weeks   Status On-going   PT LONG TERM GOAL #4   Title The aptient will improve gait speed from 1.02 ft/sec to > orequal to 1.6 ft/sec.   Baseline Target date 01/01/2016   Time 8   Period Weeks   Status On-going   PT LONG TERM  GOAL #5   Title The patient will have further equipment needs addressed, as indicated (w/c, AFO).   Baseline Target date 01/01/2016   Time 8   Period Weeks   Status On-going               Plan - 11/26/15 1428    Clinical Impression Statement The patient met 2/4 STGs.  PT to continue working to [employer].  The patient is able to propel manual wheelchair using bilateral LEs and L UE and finds this easier than using L side UE/LE only.  PT and patient have discussed use of manual w/c in home and he feels there are times he is unable to walk well b/c of low blood sugar and fatigue.   Rehab Potential Good   PT Frequency 2x / week   PT Duration 4 weeks   PT Treatment/Interventions ADLs/Self Care Home Management;Balance training;Neuromuscular re-education;Gait training;Patient/family education;Therapeutic activities;Therapeutic exercise;Functional mobility training;Stair training;Orthotic Fit/Training;Wheelchair mobility training   PT Next Visit Plan check remaining STGs, send cert order to continue at 2x/week.  Gait, balance, ther ex training.   Consulted and Agree with Plan of Care Patient      Patient will benefit from skilled therapeutic intervention in order to improve the following deficits and impairments:  Abnormal gait, Decreased balance, Decreased strength, Difficulty walking, Impaired tone, Decreased endurance, Decreased mobility  Visit Diagnosis: Other abnormalities of gait and mobility  Muscle weakness (generalized)  Unsteadiness on feet     Problem List Patient Active Problem List   Diagnosis Date Noted  . Hemiparesis affecting right side as late effect of stroke ([place]) 10/23/2015  . PAD (peripheral artery disease) ([place]) 10/20/2011  . Hyperlipidemia 02/21/2010  . CEREBROVASCULAR ACCIDENT, HX OF 12/22/2008  . Diabetes mellitus, type II ([place]) 07/30/2006  . OBESITY, NOS 07/30/2006  . Essential hypertension, benign 07/30/2006    Jasmon Graffam, PT 11/26/2015, 2:31  PM  [facility] [address] [place], [place], [address] Phone: [phone]   Fax:  [phone]  Name: [name] MRN: [id number] Date of Birth: 05/01/1951

## 2015-11-26 NOTE — Therapy (Signed)
[facility] [address] [place], [place], [address] Phone: [phone]   Fax:  [phone]  Physical Therapy Treatment  Patient Details  Name: [name] MRN: [id number] Date of Birth: 26-Jun-1950 Referring Provider: Tawanna Sat, MD  Encounter Date: 11/23/2015   11/23/15 1324  PT Visits / Re-Eval  Visit Number 6  Number of Visits 12  Date for PT Re-Evaluation 01/01/16  Authorization  Authorization Type G code every 10th visit  PT Time Calculation  PT Start Time 6415  PT Stop Time 1400  PT Time Calculation (min) 43 min  PT - End of Session  Equipment Utilized During Treatment Gait belt  Activity Tolerance Patient tolerated treatment well  Behavior During Therapy [facility] for tasks assessed/performed     Past Medical History  Diagnosis Date  . Hypertension   . Diabetes mellitus   . CVA (cerebral infarction) 01/03/2008    MRI: Acute 1 x 1.5 cm infarction affecting the left side of the pons.  . Stroke ([place])   . DVT (deep venous thrombosis) [facility])     Past Surgical History  Procedure Laterality Date  . None      There were no vitals filed for this visit.     11/23/15 1323  Symptoms/Limitations  Subjective No new complaitns. No falls or pain to report. Reports doing HEP at home and not getting as sore with them.  Pain Assessment  Currently in Pain? No/denies  Pain Score 0      11/23/15 1327  Ambulation/Gait  Ambulation/Gait Yes  Ambulation/Gait Assistance 6: Modified independent (Device/Increase time);5: Supervision (with straight cane)  Assistive device Straight cane;Large base quad cane  Gait Pattern Decreased stride length;Decreased stance time - right;Decreased dorsiflexion - right;Decreased weight shift to right;Right circumduction;Poor foot clearance - right  Ambulation Surface Level;Indoor  Gait velocity 44.41 sec's= 0.74 ft/sec with straight cane with rubber tip; 33.97 sec's= 0.97 ft/sec  with [facility]                       Berg Balance Test  Sit to Stand 4  Standing Unsupported 4  Sitting with Back Unsupported but Feet Supported on Floor or Stool 4  Stand to Sit 3  Transfers 3  Standing Unsupported with Eyes Closed 4  Standing Ubsupported with Feet Together 3  From Standing, Reach Forward with Outstretched Arm 3  From Standing Position, Pick up Object from Floor 3  From Standing Position, Turn to Look Behind Over each Shoulder 4  Turn 360 Degrees 1  Standing Unsupported, Alternately Place Feet on Step/Stool 1  Standing Unsupported, One Foot in Front 2  Standing on One Leg 2  Total Score 41          PT Short Term Goals - 11/23/15 1324    PT SHORT TERM GOAL #1   Title The patient will return demo HEP with written cues.   Baseline Target date 12/02/2015   Time 4   Period Weeks   PT SHORT TERM GOAL #2   Title The patient will improve Berg from 27/56 to > or equal to 33/56 to demo dec'ing risk for falls.   Baseline Target date 12/02/2015   Time 4   Period Weeks   PT SHORT TERM GOAL #3   Title The patient will improve gait speed from 1.02 ft/sec to > or equal to 1.3 ft/sec to transition to "limited community ambulator"   Baseline Target date 12/02/2015   Time 4  Period Weeks   PT SHORT TERM GOAL #4   Title The patient will negotiate 4 steps with step to pattern and one handrail with supervision    Baseline Target date 12/02/2015   Time 4   Period Weeks           PT Long Term Goals - 11/02/15 1600    PT LONG TERM GOAL #1   Title The patient will be indep with progression of HEP for post d/c.   Baseline Target date 01/01/2016   Time 8   Period Weeks   PT LONG TERM GOAL #2   Title The patient will improve Berg from 27/56 to > or equa lto 36/56 to demo dec'd risk for falls.   Baseline Target date 01/01/2016   Time 8   Period Weeks   PT LONG TERM GOAL #3   Title The patient will improve functional status score from 40% to > or equal to 50% for improved self  perception of mobility.   Baseline Target date 01/01/2016   Time 8   Period Weeks   PT LONG TERM GOAL #4   Title The aptient will improve gait speed from 1.02 ft/sec to > orequal to 1.6 ft/sec.   Baseline Target date 01/01/2016   Time 8   Period Weeks   PT LONG TERM GOAL #5   Title The patient will have further equipment needs addressed, as indicated (w/c, AFO).   Baseline Target date 01/01/2016   Time 8   Period Weeks        11/23/15 1324  Plan  Clinical Impression Statement Pt has met 1 of 4 STGs to date. Progressing toward others.   Pt will benefit from skilled therapeutic intervention in order to improve on the following deficits Abnormal gait;Decreased balance;Decreased strength;Difficulty walking;Impaired tone;Decreased endurance;Decreased mobility  PT Treatment/Interventions ADLs/Self Care Home Management;Balance training;Neuromuscular re-education;Gait training;Patient/family education;Therapeutic activities;Therapeutic exercise;Functional mobility training;Stair training;Orthotic Fit/Training;Wheelchair mobility training  PT Next Visit Plan check remaining STGs, plan to do 1x/week after 4 weeks, pt wanting to continue at 2x week, primary PT to discuss with him and schedule accordingly  Consulted and Agree with Plan of Care Patient       Patient will benefit from skilled therapeutic intervention in order to improve the following deficits and impairments:  Abnormal gait, Decreased balance, Decreased strength, Difficulty walking, Impaired tone, Decreased endurance, Decreased mobility  Visit Diagnosis: Other abnormalities of gait and mobility  Muscle weakness (generalized)  Unsteadiness on feet     Problem List Patient Active Problem List   Diagnosis Date Noted  . Hemiparesis affecting right side as late effect of stroke ([place]) 10/23/2015  . PAD (peripheral artery disease) ([place]) 10/20/2011  . Hyperlipidemia 02/21/2010  . CEREBROVASCULAR ACCIDENT, HX OF 12/22/2008  .  Diabetes mellitus, type II ([place]) 07/30/2006  . OBESITY, NOS 07/30/2006  . Essential hypertension, benign 07/30/2006    [name], PTA, [facility] [address] [place], [place] [address] [phone] 11/26/2015, 8:34 AM  Name: EVERRETT LACASSE MRN: [id number] Date of Birth: 1950-11-16

## 2015-11-29 ENCOUNTER — Ambulatory Visit: Payer: Medicare Other | Admitting: Rehabilitative and Restorative Service Providers"

## 2015-11-29 DIAGNOSIS — M6281 Muscle weakness (generalized): Secondary | ICD-10-CM | POA: Diagnosis not present

## 2015-11-29 DIAGNOSIS — R2681 Unsteadiness on feet: Secondary | ICD-10-CM | POA: Diagnosis not present

## 2015-11-29 DIAGNOSIS — R2689 Other abnormalities of gait and mobility: Secondary | ICD-10-CM

## 2015-11-30 ENCOUNTER — Ambulatory Visit (INDEPENDENT_AMBULATORY_CARE_PROVIDER_SITE_OTHER): Payer: Medicare Other | Admitting: Family Medicine

## 2015-11-30 ENCOUNTER — Encounter: Payer: Self-pay | Admitting: Family Medicine

## 2015-11-30 VITALS — BP 130/72 | HR 60 | Temp 98.4°F | Wt 255.0 lb

## 2015-11-30 DIAGNOSIS — I10 Essential (primary) hypertension: Secondary | ICD-10-CM | POA: Diagnosis present

## 2015-11-30 MED ORDER — SPIRONOLACTONE 50 MG PO TABS: 50.0000 mg | ORAL_TABLET | Freq: Every day | ORAL | Status: DC

## 2015-11-30 NOTE — Progress Notes (Signed)
   Subjective:    Patient ID: [name], male    DOB: December 26, 1950, 65 y.o.   MRN: [id number]  HPI  CC: hypertension follow up   # Hypertension:  No concerns with medicines, denies side effects  No pains  No dizziness ROS: no CP, no SOB, no HA, no muscle aches, no changes in voiding  Social Hx: never smoker  Review of Systems   See HPI for ROS.   Past medical history, surgical, family, and social history reviewed and updated in the EMR as appropriate. Objective:  BP 130/72 mmHg  Pulse 60  Temp(Src) 98.4 F (36.9 C) (Oral)  Wt 255 lb (115.667 kg)  SpO2 98% Vitals and nursing note reviewed  General: no apparent distress  CV: normal rate, regular rhythm, no murmur appreciated Resp: clear to auscultation bilaterally, normal effort Extremities: 2+ pitting edema bilaterally both feet and shins  Assessment & Plan:  Essential hypertension, benign At goal. Again discussed potassium monitoring which I recommended he come back in 1-2 months to get this done.     Return in about 2 months (around 01/30/2016).

## 2015-11-30 NOTE — Assessment & Plan Note (Signed)
At goal. Again discussed potassium monitoring which I recommended he come back in 1-2 months to get this done.

## 2015-11-30 NOTE — Therapy (Signed)
[facility] [address] [place], [place], [address] Phone: [phone]   Fax:  [phone]  Physical Therapy Treatment  Patient Details  Name: LORRIN NAWROT MRN: [id number] Date of Birth: 1951/05/14 Referring Provider: Tawanna Sat, MD  Encounter Date: 11/29/2015      PT End of Session - 11/29/15 1323    Visit Number 8   Number of Visits 16  modified frequency in plan today and sent recert   Date for PT Re-Evaluation 01/01/16   Authorization Type G code every 10th visit   PT Start Time 1325   PT Stop Time 1405   PT Time Calculation (min) 40 min   Equipment Utilized During Treatment Gait belt   Activity Tolerance Patient tolerated treatment well   Behavior During Therapy [facility] for tasks assessed/performed      Past Medical History  Diagnosis Date  . Hypertension   . Diabetes mellitus   . CVA (cerebral infarction) 01/03/2008    MRI: Acute 1 x 1.5 cm infarction affecting the left side of the pons.  . Stroke ([place])   . DVT (deep venous thrombosis) ([place])   . PAD (peripheral artery disease) [facility])     Past Surgical History  Procedure Laterality Date  . None      There were no vitals filed for this visit.      Subjective Assessment - 11/29/15 1318    Subjective The patient reports no changes.     Patient Stated Goals consider power w/c and work on balance/walking.     Currently in Pain? No/denies            [facility] PT Assessment - 11/29/15 1327    Ambulation/Gait   Ambulation/Gait Yes   Ambulation/Gait Assistance 6: Modified independent (Device/Increase time)   Ambulation Distance (Feet) 150 Feet   Berg Balance Test   Sit to Stand Able to stand  independently using hands   Standing Unsupported Able to stand safely 2 minutes   Sitting with Back Unsupported but Feet Supported on Floor or Stool Able to sit safely and securely 2 minutes   Stand to Sit Sits safely with minimal use of hands   Transfers  Able to transfer safely, definite need of hands   Standing Unsupported with Eyes Closed Able to stand 10 seconds safely   Standing Ubsupported with Feet Together Able to place feet together independently and stand 1 minute safely   From Standing, Reach Forward with Outstretched Arm Can reach forward >12 cm safely (5")   From Standing Position, Pick up Object from Floor Able to pick up shoe, needs supervision   From Standing Position, Turn to Look Behind Over each Shoulder Turn sideways only but maintains balance   Turn 360 Degrees Needs close supervision or verbal cueing   Standing Unsupported, Alternately Place Feet on Step/Stool Needs assistance to keep from falling or unable to try   Standing Unsupported, One Foot in [place] to take small step independently and hold 30 seconds   Standing on One Leg Unable to try or needs assist to prevent fall   Total Score 37   Berg comment: 37/56 improved from 27/56.                      [facility] Adult PT Treatment/Exercise - 11/29/15 1327    Ambulation/Gait   Assistive device Large base quad cane  followed by single point cane x 115 feet x 3 reps   Stairs Yes  Stairs Assistance 5: Supervision   Stairs Assistance Details (indicate cue type and reason) cues on correct technique of ascending with strong leg and descending with weaker leg in lead   Stair Management Technique One rail Left   Number of Stairs 8   Gait Comments Performed gait activities x 2.5 minutes on treadmill at 0.5-0.6 mph.  He reported feeling weaker as he moved and states that he thinks his blood sugar is dropping and he was able to eat a snack that he carries in his bag.  He reports "this wasn't a bad one.  When it happens bad, I'm incapacitated.."     Exercises   Exercises Other Exercises   Other Exercises  sit<>stand x 10 reps with R foot posterior to position of the left foot for increased weight shifting; standing posture; standing weight shifting.                 PT Education - 11/29/15 1401    Education provided Yes   Education Details verbally discussed HEP (sit<>stand, clam shells, mini squats, side step, supine marching)   Person(s) Educated Patient   Methods Explanation   Comprehension Verbalized understanding          PT Short Term Goals - 11/29/15 1406    PT SHORT TERM GOAL #1   Title The patient will return demo HEP with written cues.   Baseline Target date 12/02/2015   Time 4   Period Weeks   Status On-going   PT SHORT TERM GOAL #2   Title The patient will improve Berg from 27/56 to > or equal to 33/56 to demo dec'ing risk for falls.   Status Achieved   PT SHORT TERM GOAL #3   Title The patient will improve gait speed from 1.02 ft/sec to > or equal to 1.3 ft/sec to transition to "limited community ambulator"   Baseline Patient scores 1.04 ft/sec on 11/26/2015 with cues to move at a faster pace.   Status Partially Met   PT SHORT TERM GOAL #4   Title The patient will negotiate 4 steps with step to pattern and one handrail with supervision    Baseline Met on 11/26/2015   Status Achieved           PT Long Term Goals - 11/26/15 0827    PT LONG TERM GOAL #1   Title The patient will be indep with progression of HEP for post d/c.   Baseline Target date 01/01/2016   Time 8   Period Weeks   Status On-going   PT LONG TERM GOAL #2   Title The patient will improve Berg from 27/56 to > or equa lto 36/56 to demo dec'd risk for falls.   Baseline 11/23/15: 41/56 scored   Time --   Period --   Status Achieved   PT LONG TERM GOAL #3   Title The patient will improve functional status score from 40% to > or equal to 50% for improved self perception of mobility.   Baseline Target date 01/01/2016   Time 8   Period Weeks   Status On-going   PT LONG TERM GOAL #4   Title The aptient will improve gait speed from 1.02 ft/sec to > orequal to 1.6 ft/sec.   Baseline Target date 01/01/2016   Time 8   Period Weeks   Status  On-going   PT LONG TERM GOAL #5   Title The patient will have further equipment needs addressed, as indicated (w/c, AFO).  Baseline Target date 01/01/2016   Time 8   Period Weeks   Status On-going               Plan - 11/29/15 2018    Clinical Impression Statement The patient prefers use of quad cane over SPC due to quad cane not falling over as easily during standing activities.  Due to chronic nature of deficits, anticipate the patient may not be able to improve gait speed to long term goal level.  He reports subjectively feeling stronger in day to day mobility and balance.  At today's session, the patient had increased weakness due to changes in blood sugar levels (not assessed, per patient report).  He carries snacks with him and was able to verbalize the need for sugar.     PT Frequency 2x / week  *MODIFIED FROM ORIGINAL PLAN AS PATIENT MAKING CONTINUED PROGRESS   PT Duration 4 weeks   PT Treatment/Interventions ADLs/Self Care Home Management;Balance training;Neuromuscular re-education;Gait training;Patient/family education;Therapeutic activities;Therapeutic exercise;Functional mobility training;Stair training;Orthotic Fit/Training;Wheelchair mobility training   PT Next Visit Plan Work towards [employer], work on gait speed activities to patient tolerance, progress functional mobility, further discuss need for manual wheelchair   Consulted and Agree with Plan of Care Patient      Patient will benefit from skilled therapeutic intervention in order to improve the following deficits and impairments:  Abnormal gait, Decreased balance, Decreased strength, Difficulty walking, Impaired tone, Decreased endurance, Decreased mobility  Visit Diagnosis: Other abnormalities of gait and mobility  Muscle weakness (generalized)  Unsteadiness on feet     Problem List Patient Active Problem List   Diagnosis Date Noted  . Hemiparesis affecting right side as late effect of stroke ([place])  10/23/2015  . PAD (peripheral artery disease) ([place]) 10/20/2011  . Hyperlipidemia 02/21/2010  . CEREBROVASCULAR ACCIDENT, HX OF 12/22/2008  . Diabetes mellitus, type II ([place]) 07/30/2006  . OBESITY, NOS 07/30/2006  . Essential hypertension, benign 07/30/2006    Bralee Feldt, PT 11/30/2015, 5:59 AM  [facility] [address] [place], [place], [address] Phone: [phone]   Fax:  [phone]  Name: HARRIS PENTON MRN: [id number] Date of Birth: May 19, 1951

## 2015-11-30 NOTE — Patient Instructions (Signed)
Blood pressure is well controlled today!  You should make sure to come back in 1-2 months to get your blood work tested again to monitor potassium

## 2015-12-03 ENCOUNTER — Ambulatory Visit: Payer: Medicare Other | Admitting: Physical Therapy

## 2015-12-06 ENCOUNTER — Ambulatory Visit: Payer: Medicare Other | Admitting: Rehabilitative and Restorative Service Providers"

## 2015-12-07 ENCOUNTER — Ambulatory Visit: Payer: Medicare Other | Admitting: Rehabilitative and Restorative Service Providers"

## 2015-12-12 ENCOUNTER — Ambulatory Visit: Payer: Medicare Other | Admitting: Rehabilitative and Restorative Service Providers"

## 2015-12-14 ENCOUNTER — Ambulatory Visit: Payer: Medicare Other | Admitting: Physical Therapy

## 2015-12-17 ENCOUNTER — Ambulatory Visit: Payer: Medicare Other | Attending: Family Medicine | Admitting: Physical Therapy

## 2015-12-17 ENCOUNTER — Encounter: Payer: Self-pay | Admitting: Physical Therapy

## 2015-12-17 DIAGNOSIS — R2689 Other abnormalities of gait and mobility: Secondary | ICD-10-CM | POA: Diagnosis not present

## 2015-12-17 DIAGNOSIS — M6281 Muscle weakness (generalized): Secondary | ICD-10-CM | POA: Diagnosis not present

## 2015-12-17 DIAGNOSIS — R2681 Unsteadiness on feet: Secondary | ICD-10-CM | POA: Insufficient documentation

## 2015-12-18 NOTE — Therapy (Signed)
[facility] [address] [place], [place], [address] Phone: [phone]   Fax:  [phone]  Physical Therapy Treatment  Patient Details  Name: [name] MRN: [id number] Date of Birth: [birth year] Referring Provider: Tawanna Sat, MD  Encounter Date: 12/17/2015   12/17/15 1236  PT Visits / Re-Eval  Visit Number 9  Number of Visits 16 (modified frequency in plan today and sent recert)  Date for PT Re-Evaluation 01/01/16  Authorization  Authorization Type G code every 10th visit  PT Time Calculation  PT Start Time 1232  PT Stop Time 1315  PT Time Calculation (min) 43 min  PT - End of Session  Equipment Utilized During Treatment Gait belt  Activity Tolerance Patient tolerated treatment well  Behavior During Therapy [facility] for tasks assessed/performed     Past Medical History  Diagnosis Date  . Hypertension   . Diabetes mellitus   . CVA (cerebral infarction) 01/03/2008    MRI: Acute 1 x 1.5 cm infarction affecting the left side of the pons.  . Stroke ([place])   . DVT (deep venous thrombosis) ([place])   . PAD (peripheral artery disease) [facility])     Past Surgical History  Procedure Laterality Date  . None      There were no vitals filed for this visit.     12/17/15 1235  Symptoms/Limitations  Subjective No new complaints. No falls or pain to report.  Patient Stated Goals consider power w/c and work on balance/walking.    Pain Assessment  Currently in Pain? No/denies  Pain Score 0      12/17/15 1238  Transfers  Transfers Sit to Stand;Stand to Sit  Ambulation/Gait  Ambulation/Gait Yes  Ambulation/Gait Assistance 4: Min guard  Ambulation/Gait Assistance Details cues for increased step length and increased stride length progressing pt to increased gait speed  Ambulation Distance (Feet) 220 Feet  Assistive device Large base quad cane  Gait Pattern Decreased stride length;Decreased stance time -  right;Decreased dorsiflexion - right;Decreased weight shift to right;Right circumduction;Poor foot clearance - right  Ambulation Surface Level;Indoor  Knee/Hip Exercises: Seated  Long Arc Quad Strengthening;Right;AROM;10 reps;Limitations;2 sets (5 sec hold)  Long Arc Quad Limitations cues on posture to decrease posterior lean  Marching AROM;Strengthening;Right;1 set;10 reps;Limitations  Marching Limitations cues on form and technique, limited range noted with exercises  Knee/Hip Exercises: Supine  Bridges Strengthening;Both;1 set;10 reps;Limitations (5 sec holds)  Bridges Limitations cues for increased pelvic lift and hold times  Straight Leg Raises AAROM;Strengthening;Right;10 reps;2 sets;Limitations  Straight Leg Raises Limitations assist needed to complete the lift and to maintain knee control  Single Leg Bridge AROM;Strengthening;Right;1 set;10 reps (5 sec holds, cues on form and technique)          PT Short Term Goals - 11/29/15 1406    PT SHORT TERM GOAL #1   Title The patient will return demo HEP with written cues.   Baseline Target date 12/02/2015   Time 4   Period Weeks   Status On-going   PT SHORT TERM GOAL #2   Title The patient will improve Berg from 27/56 to > or equal to 33/56 to demo dec'ing risk for falls.   Status Achieved   PT SHORT TERM GOAL #3   Title The patient will improve gait speed from 1.02 ft/sec to > or equal to 1.3 ft/sec to transition to "limited community ambulator"   Baseline Patient scores 1.04 ft/sec on 11/26/2015 with cues to move at a faster pace.  Status Partially Met   PT SHORT TERM GOAL #4   Title The patient will negotiate 4 steps with step to pattern and one handrail with supervision    Baseline Met on 11/26/2015   Status Achieved           PT Long Term Goals - 11/26/15 0827    PT LONG TERM GOAL #1   Title The patient will be indep with progression of HEP for post d/c.   Baseline Target date 01/01/2016   Time 8   Period Weeks    Status On-going   PT LONG TERM GOAL #2   Title The patient will improve Berg from 27/56 to > or equa lto 36/56 to demo dec'd risk for falls.   Baseline 11/23/15: 41/56 scored   Time --   Period --   Status Achieved   PT LONG TERM GOAL #3   Title The patient will improve functional status score from 40% to > or equal to 50% for improved self perception of mobility.   Baseline Target date 01/01/2016   Time 8   Period Weeks   Status On-going   PT LONG TERM GOAL #4   Title The aptient will improve gait speed from 1.02 ft/sec to > orequal to 1.6 ft/sec.   Baseline Target date 01/01/2016   Time 8   Period Weeks   Status On-going   PT LONG TERM GOAL #5   Title The patient will have further equipment needs addressed, as indicated (w/c, AFO).   Baseline Target date 01/01/2016   Time 8   Period Weeks   Status On-going        12/17/15 1236  Plan  Clinical Impression Statement Pt was able to increase his gait speed for short durations before reverting back to his normal gait pattern/speed. Remainder of session focused on strengthening without any issues reported. Pt is making steady progress toward goals.  Pt will benefit from skilled therapeutic intervention in order to improve on the following deficits Abnormal gait;Decreased balance;Decreased strength;Difficulty walking;Impaired tone;Decreased endurance;Decreased mobility  PT Frequency 2x / week (*MODIFIED FROM ORIGINAL PLAN AS PATIENT MAKING CONTINUED PROGRESS)  PT Duration 4 weeks  PT Treatment/Interventions ADLs/Self Care Home Management;Balance training;Neuromuscular re-education;Gait training;Patient/family education;Therapeutic activities;Therapeutic exercise;Functional mobility training;Stair training;Orthotic Fit/Training;Wheelchair mobility training  PT Next Visit Plan Work towards [employer], work on gait speed activities to patient tolerance, progress functional mobility, further discuss need for manual wheelchair  Consulted and Agree with  Plan of Care Patient       Patient will benefit from skilled therapeutic intervention in order to improve the following deficits and impairments:  Abnormal gait, Decreased balance, Decreased strength, Difficulty walking, Impaired tone, Decreased endurance, Decreased mobility  Visit Diagnosis: Other abnormalities of gait and mobility  Muscle weakness (generalized)  Unsteadiness on feet     Problem List Patient Active Problem List   Diagnosis Date Noted  . Hemiparesis affecting right side as late effect of stroke ([place]) 10/23/2015  . PAD (peripheral artery disease) ([place]) 10/20/2011  . Hyperlipidemia 02/21/2010  . CEREBROVASCULAR ACCIDENT, HX OF 12/22/2008  . Diabetes mellitus, type II ([place]) 07/30/2006  . OBESITY, NOS 07/30/2006  . Essential hypertension, benign 07/30/2006    [name], PTA, [facility] [address], [place], [place] [address] [phone] 12/18/2015, 8:40 PM   Name: [name] MRN: [id number] Date of Birth: Jul 04, 1950

## 2015-12-21 ENCOUNTER — Encounter: Payer: Self-pay | Admitting: Physical Therapy

## 2015-12-21 ENCOUNTER — Ambulatory Visit: Payer: Medicare Other | Admitting: Physical Therapy

## 2015-12-21 DIAGNOSIS — M6281 Muscle weakness (generalized): Secondary | ICD-10-CM

## 2015-12-21 DIAGNOSIS — R2681 Unsteadiness on feet: Secondary | ICD-10-CM | POA: Diagnosis not present

## 2015-12-21 DIAGNOSIS — R2689 Other abnormalities of gait and mobility: Secondary | ICD-10-CM

## 2015-12-21 NOTE — Therapy (Addendum)
[facility] [address] [place], [place], [address] Phone: [phone]   Fax:  [phone]  Physical Therapy Treatment  Patient Details  Name: ROYALE SWAMY MRN: [id number] Date of Birth: Dec 05, 1950 Referring Provider: Tawanna Sat, MD  Encounter Date: 12/21/2015      PT End of Session - 12/21/15 1434    Visit Number 10   Number of Visits 16   Date for PT Re-Evaluation 01/01/16   Authorization Type G code every 10th visit   PT Start Time 1318   PT Stop Time 1401   PT Time Calculation (min) 43 min   Equipment Utilized During Treatment Gait belt   Activity Tolerance Patient tolerated treatment well   Behavior During Therapy [facility] for tasks assessed/performed      Past Medical History  Diagnosis Date  . Hypertension   . Diabetes mellitus   . CVA (cerebral infarction) 01/03/2008    MRI: Acute 1 x 1.5 cm infarction affecting the left side of the pons.  . Stroke ([place])   . DVT (deep venous thrombosis) ([place])   . PAD (peripheral artery disease) [facility])     Past Surgical History  Procedure Laterality Date  . None      There were no vitals filed for this visit.      Subjective Assessment - 12/21/15 1323    Subjective No pain. Reports falling last night in front of his fan. Fell onto his butt, and took him less time to get back up (about 15 minutes, as opposed to about 45 minutes before). Reports no injury from fall, but some stiffness in his L leg. No new complaints.   Patient Stated Goals consider power w/c and work on balance/walking.     Currently in Pain? No/denies   Pain Score 0-No pain           [facility] PT Assessment - 12/21/15 1319    Berg Balance Test   Sit to Stand Able to stand  independently using hands   Standing Unsupported Able to stand safely 2 minutes   Sitting with Back Unsupported but Feet Supported on Floor or Stool Able to sit safely and securely 2 minutes   Stand to Sit Controls descent  by using hands   Transfers Able to transfer safely, minor use of hands   Standing Unsupported with Eyes Closed Able to stand 10 seconds safely   Standing Ubsupported with Feet Together Able to place feet together independently and stand 1 minute safely   From Standing, Reach Forward with Outstretched Arm Can reach forward >12 cm safely (5")   From Standing Position, Pick up Object from Floor Able to pick up shoe, needs supervision   From Standing Position, Turn to Look Behind Over each Shoulder Looks behind one side only/other side shows less weight shift   Turn 360 Degrees Able to turn 360 degrees safely but slowly   Standing Unsupported, Alternately Place Feet on Step/Stool Able to complete >2 steps/needs minimal assist   Standing Unsupported, One Foot in Front Able to take small step independently and hold 30 seconds   Standing on One Leg Able to lift leg independently and hold equal to or more than 3 seconds   Total Score 42   Berg comment: 42/56 improved from 27/56             [facility] Adult PT Treatment/Exercise - 12/21/15 1319    Ambulation/Gait   Ambulation/Gait Yes   Ambulation/Gait Assistance 4: Min guard;6: Modified  independent (Device/Increase time)   Ambulation/Gait Assistance Details VC for increased step length   Ambulation Distance (Feet) 230 Feet   Assistive device Large base quad cane   Gait Pattern Decreased stride length;Decreased stance time - right;Decreased dorsiflexion - right;Decreased weight shift to right;Right circumduction;Poor foot clearance - right   Ambulation Surface Level;Indoor   Knee/Hip Exercises: Seated   Long Arc Quad AROM;Strengthening;Both;10 reps;2 sets;Weights  5 sec holds on 2nd set   [employer] Weight 2 lbs.  cuff weights on B ankles   Marching AROM;Strengthening;Right;1 set;10 reps;Limitations   Marching Limitations cues on form and technique, limited range noted with exercises   Marching Weights 2 lbs.  cuff weight on B ankles    Knee/Hip Exercises: Supine   Straight Leg Raises Strengthening;Right;10 reps;Limitations;1 set;AROM   Straight Leg Raises Limitations VC to keep knee extended straight during SLR             PT Short Term Goals - 11/29/15 1406    PT SHORT TERM GOAL #1   Title The patient will return demo HEP with written cues.   Baseline Target date 12/02/2015   Time 4   Period Weeks   Status On-going   PT SHORT TERM GOAL #2   Title The patient will improve Berg from 27/56 to > or equal to 33/56 to demo dec'ing risk for falls.   Status Achieved   PT SHORT TERM GOAL #3   Title The patient will improve gait speed from 1.02 ft/sec to > or equal to 1.3 ft/sec to transition to "limited community ambulator"   Baseline Patient scores 1.04 ft/sec on 11/26/2015 with cues to move at a faster pace.   Status Partially Met   PT SHORT TERM GOAL #4   Title The patient will negotiate 4 steps with step to pattern and one handrail with supervision    Baseline Met on 11/26/2015   Status Achieved           PT Long Term Goals - 11/26/15 0827    PT LONG TERM GOAL #1   Title The patient will be indep with progression of HEP for post d/c.   Baseline Target date 01/01/2016   Time 8   Period Weeks   Status On-going   PT LONG TERM GOAL #2   Title The patient will improve Berg from 27/56 to > or equa lto 36/56 to demo dec'd risk for falls.   Baseline 11/23/15: 41/56 scored   Time --   Period --   Status Achieved   PT LONG TERM GOAL #3   Title The patient will improve functional status score from 40% to > or equal to 50% for improved self perception of mobility.   Baseline Target date 01/01/2016   Time 8   Period Weeks   Status On-going   PT LONG TERM GOAL #4   Title The aptient will improve gait speed from 1.02 ft/sec to > orequal to 1.6 ft/sec.   Baseline Target date 01/01/2016   Time 8   Period Weeks   Status On-going   PT LONG TERM GOAL #5   Title The patient will have further equipment needs addressed, as  indicated (w/c, AFO).   Baseline Target date 01/01/2016   Time 8   Period Weeks   Status On-going           Plan - 12/21/15 1435    Clinical Impression Statement Pt was able to ambulate 230 ft nonstop before needing to sit.  He scored 42/56 on the [employer] in clinic today. Remainder of session focused on strengthening exercises for R LE. He is making steady progress and should benefit from continued skilled PT.   Rehab Potential Good   PT Frequency 2x / week   PT Duration 4 weeks   PT Treatment/Interventions ADLs/Self Care Home Management;Balance training;Neuromuscular re-education;Gait training;Patient/family education;Therapeutic activities;Therapeutic exercise;Functional mobility training;Stair training;Orthotic Fit/Training;Wheelchair mobility training   PT Next Visit Plan Patient requested info on [facility]. Work towards [employer], work on gait speed activities to patient tolerance, progress functional mobility, further discuss need for manual wheelchair.   Consulted and Agree with Plan of Care Patient      Patient will benefit from skilled therapeutic intervention in order to improve the following deficits and impairments:  Abnormal gait, Decreased balance, Decreased strength, Difficulty walking, Impaired tone, Decreased endurance, Decreased mobility  Visit Diagnosis: Other abnormalities of gait and mobility  Muscle weakness (generalized)  Unsteadiness on feet       G-Codes - 01-19-16 1440    Functional Assessment Tool Used Merrilee Jansky 42/56      Problem List Patient Active Problem List   Diagnosis Date Noted  . Hemiparesis affecting right side as late effect of stroke ([place]) 10/23/2015  . PAD (peripheral artery disease) ([place]) 10/20/2011  . Hyperlipidemia 02/21/2010  . CEREBROVASCULAR ACCIDENT, HX OF 12/22/2008  . Diabetes mellitus, type II ([place]) 07/30/2006  . OBESITY, NOS 07/30/2006  . Essential hypertension, benign 07/30/2006    Trixie Deis,  [place] 2016-01-19, 4:17 PM  [facility] [address] [place], [place], [address] Phone: [phone]   Fax:  [phone]  Name: BEREN YNIGUEZ MRN: [id number] Date of Birth: Nov 03, 1950  This note has been reviewed and edited by supervising CI.  [name], PTA, [facility] [address] [place], [place] [address] [phone] 01/19/16, 4:19 PM  Physical Therapy Progress Note  Dates of Reporting Period: 11/02/2015 to 01-19-16  Objective Reports of Subjective Statement: Patient more active in home, continues to have good/bad days depending on medical factors.  He notes improved balance.  Objective Measurements: Berg=42/56, gait speed=1.04 ft/sec  Goal Update: see above  Plan: Continue working towards [place] established at initial evaluation.  Reason Skilled Services are Required: Continue working on gait, balance to optimize current functional status.  Plan to progress HEP for d/c.   WEAVER,CHRISTINA, PT

## 2015-12-24 ENCOUNTER — Telehealth: Payer: Self-pay

## 2015-12-24 ENCOUNTER — Encounter: Payer: Self-pay | Admitting: Internal Medicine

## 2015-12-24 ENCOUNTER — Ambulatory Visit (INDEPENDENT_AMBULATORY_CARE_PROVIDER_SITE_OTHER): Payer: Medicare Other | Admitting: Internal Medicine

## 2015-12-24 VITALS — BP 148/66 | HR 68 | Ht 67.0 in | Wt 252.8 lb

## 2015-12-24 DIAGNOSIS — E119 Type 2 diabetes mellitus without complications: Secondary | ICD-10-CM | POA: Diagnosis not present

## 2015-12-24 DIAGNOSIS — Z7902 Long term (current) use of antithrombotics/antiplatelets: Secondary | ICD-10-CM

## 2015-12-24 DIAGNOSIS — Z1211 Encounter for screening for malignant neoplasm of colon: Secondary | ICD-10-CM

## 2015-12-24 DIAGNOSIS — Z794 Long term (current) use of insulin: Secondary | ICD-10-CM

## 2015-12-24 DIAGNOSIS — Z8673 Personal history of transient ischemic attack (TIA), and cerebral infarction without residual deficits: Secondary | ICD-10-CM

## 2015-12-24 DIAGNOSIS — I672 Cerebral atherosclerosis: Secondary | ICD-10-CM

## 2015-12-24 NOTE — Telephone Encounter (Signed)
[facility] [address]  [place] [address]   RE: [name] DOB: 01/31/1951 MRN: [id number]   Dear Tawanna Sat MD,    We have scheduled the above patient for an endoscopic procedure. Our records show that he is on anticoagulation therapy.   Please advise as to how long the patient may come off his therapy of Plavix prior to the colonoscopy procedure, which is scheduled for 01/29/16.  Please fax back/ or route the completed form to Anandi Abramo [place], [place] at [phone].   Sincerely,    Silvano Rusk, MD, [facility]

## 2015-12-24 NOTE — Progress Notes (Signed)
Referred By:  Leone Brand, MD    Subjective:    Patient ID: SAY FRIPP, male    DOB: 13-Nov-1950, 65 y.o.   MRN: [id number]  CC: Referral for colon screen  HPI Azul Klopfenstein is a 65 y.o. Male referred to the office for colon cancer screening from Dr. Mitzi Hansen with [facility]. Has a history of IDDM, HTN, and Stroke in 2008 with right sided hemiparesis and currently on plavix for prevention. To his knowledge he has never had a colonoscopy or colon screening preformed. On questioning he has intermittent diarrhea that he associates with fruits and milk intake, but does not have any pain. Denies any constipation, change in bowels, weight loss, fevers, chills, nausea, or vomiting.  No Known Allergies Outpatient Medications Prior to Visit  Medication Sig Dispense Refill  . amLODipine (NORVASC) 10 MG tablet TAKE 1 TABLET (10 MG TOTAL) BY MOUTH DAILY. 90 tablet 3  . aspirin 81 MG tablet Take 1 tablet (81 mg total) by mouth daily. 30 tablet 11  . atorvastatin (LIPITOR) 40 MG tablet Take 1 tablet (40 mg total) by mouth daily. 90 tablet 3  . B-D ULTRAFINE III SHORT PEN 31G X 8 MM MISC USE WITH LEVEMIR 100 each 0  . baclofen (LIORESAL) 10 MG tablet TAKE 1/2 TABLET BY MOUTH 1 time a DAY AS NEEDED FOR MUSCLE SPASMS 30 tablet 3  . Blood Gluc Meter Disp-Strips (SIDEKICK BLOOD GLUCOSE SYSTEM) DEVI Use for daily testing 100 each 3  . Blood Pressure Monitoring (BLOOD PRESSURE CUFF) MISC by Does not apply route. Pt needs a large cuff     . carvedilol (COREG) 25 MG tablet TAKE 1 TABLET (25 MG TOTAL) BY MOUTH 2 (TWO) TIMES DAILY WITH A MEAL. 180 tablet 3  . clopidogrel (PLAVIX) 75 MG tablet TAKE 1 TABLET (75 MG TOTAL) BY MOUTH DAILY. 90 tablet 3  . fish oil-omega-3 fatty acids 1000 MG capsule Take 1 g by mouth daily.    . hydrochlorothiazide (HYDRODIURIL) 25 MG tablet TAKE 1 TABLET (25 MG TOTAL) BY MOUTH DAILY. 90 tablet 2  . insulin aspart (NOVOLOG FLEXPEN) 100 UNIT/ML FlexPen  Inject 15 Units into the skin 2 (two) times daily with a meal. 3 pen 3  . Insulin Syringe-Needle U-100 28G X 1/2" 1 ML MISC 1 each by Does not apply route 3 (three) times daily. 100 each 11  . LEVEMIR FLEXTOUCH 100 UNIT/ML Pen INJECT 30 UNITS INTO THE SKIN DAILY 15 pen 5  . lisinopril (PRINIVIL,ZESTRIL) 40 MG tablet TAKE 1 TABLET (40 MG TOTAL) BY MOUTH DAILY. 90 tablet 3  . Misc. Devices (QUAD CANE/SMALL BASE) MISC by Does not apply route.      . sertraline (ZOLOFT) 100 MG tablet Take 1 tablet (100 mg total) by mouth daily. 90 tablet 1  . sildenafil (REVATIO) 20 MG tablet Take 2-3 tablets (40-60 mg total) by mouth daily as needed (intercourse). 30 tablet 0  . spironolactone (ALDACTONE) 50 MG tablet Take 1 tablet (50 mg total) by mouth daily. 90 tablet 1  . Tdap (BOOSTRIX) 5-2.5-18.5 LF-MCG/0.5 injection Inject 0.5 mLs into the muscle once. 0.5 mL 0   No facility-administered medications prior to visit.    Past Medical History:  Diagnosis Date  . CVA (cerebral infarction) 01/03/2008   MRI: Acute 1 x 1.5 cm infarction affecting the left side of the pons.  . Diabetes mellitus   . DVT (deep venous thrombosis) ([place])   . Hypertension   .  PAD (peripheral artery disease) ([place])   . Stroke [facility])    Past Surgical History:  Procedure Laterality Date  . None     Social History   Social History  . Marital status: Divorced    Spouse name: N/A  . Number of children: 3  . Years of education: N/A   Occupational History  . Disabled-security guard [place] History Main Topics  . Smoking status: Never Smoker  . Smokeless tobacco: Never Used  . Alcohol use No  . Drug use: No  . Sexual activity: No   Other Topics Concern  . None   Social History Narrative   Divorced   2 sons, 1 daughter   Retired/disabled   No caffeine   12/24/2015      Family History  Problem Relation Age of Onset  . Diabetes Mother   . Heart attack Mother   . Hypertension Mother   . Diabetes  Sister   . Hypertension Sister   . Hypertension Brother   . Hypertension Daughter   . Hypertension Son        Review of Systems  See HPI,  Wears brace on RLE and walks using a cane - limited use RUE all other systems are negative     Objective:   Physical Exam  @BP  (!) 148/66 (BP Location: Left Arm, Patient Position: Sitting, Cuff Size: Large)   Pulse 68   Ht 5\' 7"  (1.702 m)   Wt 252 lb 12.8 oz (114.7 kg)   BMI 39.59 kg/m @  General:  Well-developed, well-nourished and in no acute distress Eyes:  anicteric. Lungs: Clear to auscultation bilaterally. Heart:   S1S2, no rubs, murmurs, gallops. Abdomen: soft, non-tender, no hepatosplenomegaly, hernia, or mass and BS+.  Rectal: Deferred until colonoscopy Neuro:  A&O x 3. Right hemiparesis Psych:  appropriate mood and  Affect.   Data Reviewed: Labs from 09/25/15 and 11/05/15 appear stable PCP Notes from 09/25/15 for health maintenance     Assessment & Plan:   Encounter Diagnoses  Name Primary?  . Colon cancer screening Yes  . Long term current use of antithrombotics/antiplatelets - clopidogrel   . Cerebrovascular disease, arteriosclerotic, post-stroke   . Type 2 diabetes mellitus with insulin therapy [facility])     Patient presented for colon cancer screening from PCP. We discussed all options including Cologaurd, fecal immunochemical occult test, and colonoscopy. After discussion of risk and benefits of each the patient is willing undergo colonoscopy. Educated about holding Plavix 5 days prior to procedure and how this can increase his risk for another stroke but he will stay on his Aspirin.  Patient is willing to continue with colonoscopy.  Grayland Ormond PA-S  I have seen the patient with Mr. Venetia Maxon and he has served as a Education administrator.  Hold clopidogrel 5days before procedure - will instruct when and how to resume after procedure. Risks and benefits of procedure including bleeding, perforation, infection, missed lesions,  medication reactions and possible hospitalization or surgery if complications occur explained. Additional rare but real risk of cardiovascular event such as heart attack or ischemia/infarct of other organs off clopidogrel explained and need to seek urgent help if this occurs. Will communicate by phone or EMR with patient's prescribing provider that to confirm holding clopidogrel is reasonable in this case.     [id number], Tillie Rung, MD

## 2015-12-24 NOTE — Patient Instructions (Signed)
  You have been scheduled for a colonoscopy. Please follow written instructions given to you at your visit today.  Please pick up your prep supplies at the pharmacy. If you use inhalers (even only as needed), please bring them with you on the day of your procedure.  You will be contacted by our office prior to your procedure for directions on holding your Plavix.  If you do not hear from our office 1 week prior to your scheduled procedure, please call [phone] to discuss.   Stay on your Asprin per Dr Carlean Purl.     I appreciate the opportunity to care for you. Silvano Rusk, MD, [facility]

## 2015-12-26 ENCOUNTER — Ambulatory Visit: Payer: Medicare Other | Admitting: Rehabilitative and Restorative Service Providers"

## 2015-12-26 DIAGNOSIS — M6281 Muscle weakness (generalized): Secondary | ICD-10-CM

## 2015-12-26 DIAGNOSIS — R2689 Other abnormalities of gait and mobility: Secondary | ICD-10-CM

## 2015-12-26 DIAGNOSIS — R2681 Unsteadiness on feet: Secondary | ICD-10-CM | POA: Diagnosis not present

## 2015-12-26 NOTE — Patient Instructions (Signed)
Functional Quadriceps: Sit to Stand    Sit on edge of chair, feet flat on floor. Stand upright, extending knees fully. Repeat __10__ times per set. Do _1___ sets per session. Do __2__ sessions per day.  http://orth.exer.us/734   Copyright  VHI. All rights reserved.   AMBULATION: Side Step    Step sideways. Repeat in opposite direction. __10_ reps per set,  2_ sets per day. Use countertop for support  Copyright  VHI. All rights reserved.   Mini Squat: Double Leg    With feet shoulder width apart, reach forward for balance and do a mini squat. Keep knees in line with second toe. Knees do not go past toes. Repeat _10__ times per set.  Do __2_ sets per session.  http://plyo.exer.us/70   Copyright  VHI. All rights reserved.   KNEE: Extension, Long Arc Quads - Sitting    Raise right leg until knee is straight. __10_ reps per set, _2__ sets per day.  Copyright  VHI. All rights reserved.   Knee Flexion: Sitting (Single Leg)    Move right leg forward and backwards while sitting (no band needed around ankle). Repeat _10_ times per set. Repeat with other leg. Do _2_ sets per day.  http://tub.exer.us/181   Copyright  VHI. All rights reserved.  AMBULATION: Walk Backward    Walk backward. Take large steps, do not drag feet.  HOLD COUNTERTOP WITH LEFT HAND _10__ reps per set, ___2  sets per day.  Copyright  VHI. All rights reserved.

## 2015-12-27 NOTE — Therapy (Signed)
[facility] [address] [place], [place], [address] Phone: [phone]   Fax:  [phone]  Physical Therapy Treatment  Patient Details  Name: [name] MRN: [id number] Date of Birth: 07-Feb-1951 Referring Provider: Tawanna Sat, MD  Encounter Date: 12/26/2015      PT End of Session - 12/26/15 0901    Visit Number 11   Number of Visits 16   Date for PT Re-Evaluation 01/01/16   Authorization Type G code every 10th visit   PT Start Time 0853   PT Stop Time 0933   PT Time Calculation (min) 40 min   Equipment Utilized During Treatment Gait belt   Activity Tolerance Patient tolerated treatment well   Behavior During Therapy [facility] for tasks assessed/performed      Past Medical History:  Diagnosis Date  . CVA (cerebral infarction) 01/03/2008   MRI: Acute 1 x 1.5 cm infarction affecting the left side of the pons.  . Diabetes mellitus   . DVT (deep venous thrombosis) ([place])   . Hypertension   . PAD (peripheral artery disease) ([place])   . Stroke [facility])     Past Surgical History:  Procedure Laterality Date  . None      There were no vitals filed for this visit.      Subjective Assessment - 12/26/15 0854    Subjective "I noticed 2 things this week."  The patient reports that he has improved endurance noting he was able to walk a long distance without fatigue on Monday.  He also notes that he is able to move his right leg faster, which helps him step when he looses his balance.    He also notes that overall pain improving.   Patient Stated Goals consider power w/c and work on balance/walking.     Currently in Pain? No/denies            [facility] Adult PT Treatment/Exercise - 12/26/15 0908      Ambulation/Gait   Ambulation/Gait Yes   Ambulation/Gait Assistance 6: Modified independent (Device/Increase time)   Ambulation/Gait Assistance Details patient with step through pattern   Ambulation Distance (Feet) 350  Feet   Assistive device Large base quad cane   Gait Pattern Decreased stride length   Ambulation Surface Level   Gait velocity 2.04 ft/sec      THERAPEUTIC EXERCISE: Updated HEP by discharging supine marching activities, added long arc quads, knee flexion seated and standing backwards walking Continued with prior activities Attempted standing R knee flexion with min A for R LE mgmt   PT provided written instruction for newly added HEP and was able to return demo/verbalize understanding.     PT Short Term Goals - 11/29/15 1406      PT SHORT TERM GOAL #1   Title The patient will return demo HEP with written cues.   Baseline Target date 12/02/2015   Time 4   Period Weeks   Status On-going     PT SHORT TERM GOAL #2   Title The patient will improve Berg from 27/56 to > or equal to 33/56 to demo dec'ing risk for falls.   Status Achieved     PT SHORT TERM GOAL #3   Title The patient will improve gait speed from 1.02 ft/sec to > or equal to 1.3 ft/sec to transition to "limited community ambulator"   Baseline Patient scores 1.04 ft/sec on 11/26/2015 with cues to move at a faster pace.   Status Partially Met  PT SHORT TERM GOAL #4   Title The patient will negotiate 4 steps with step to pattern and one handrail with supervision    Baseline Met on 11/26/2015   Status Achieved           PT Long Term Goals - 12/26/15 0901      PT LONG TERM GOAL #1   Title The patient will be indep with progression of HEP for post d/c.   Baseline Target date 01/01/2016   Time 8   Period Weeks   Status On-going     PT LONG TERM GOAL #2   Title The patient will improve Berg from 27/56 to > or equa lto 36/56 to demo dec'd risk for falls.   Baseline 11/23/15: 41/56 scored   Status Achieved     PT LONG TERM GOAL #3   Title The patient will improve functional status score from 40% to > or equal to 50% for improved self perception of mobility.   Baseline Target date 01/01/2016   Time 8   Period Weeks    Status On-going     PT LONG TERM GOAL #4   Title The aptient will improve gait speed from 1.02 ft/sec to > orequal to 1.6 ft/sec.   Baseline Met on 12/26/2015 scoring 2.04 ft/sec with quad cane.   Time 8   Period Weeks   Status Achieved     PT LONG TERM GOAL #5   Title The patient will have further equipment needs addressed, as indicated (w/c, AFO).   Baseline Target date 01/01/2016   Time 8   Period Weeks   Status On-going               Plan - 12/27/15 1406    Clinical Impression Statement The patient has met 2 LTGs.  PT to continue working towards remaining goals in preparation for d/c around 01/01/2016.  PT and patient discussed manual w/c and patient feels he would use this regularly for indoor surfaces due to fluctuations in his medical/mobility status due to diabetes.   PT Treatment/Interventions ADLs/Self Care Home Management;Balance training;Neuromuscular re-education;Gait training;Patient/family education;Therapeutic activities;Therapeutic exercise;Functional mobility training;Stair training;Orthotic Fit/Training;Wheelchair mobility training   PT Next Visit Plan Please provide info for [facility] center.  Check LTGs and plan to d/c on 8/1 visit (cancel 8/4 visit- let patient know). Review HEP as needed as updated today.   Consulted and Agree with Plan of Care Patient      Patient will benefit from skilled therapeutic intervention in order to improve the following deficits and impairments:  Abnormal gait, Decreased balance, Decreased strength, Difficulty walking, Impaired tone, Decreased endurance, Decreased mobility  Visit Diagnosis: Muscle weakness (generalized)  Other abnormalities of gait and mobility     Problem List Patient Active Problem List   Diagnosis Date Noted  . Hemiparesis affecting right side as late effect of stroke ([place]) 10/23/2015  . PAD (peripheral artery disease) ([place]) 10/20/2011  . Hyperlipidemia 02/21/2010  . CEREBROVASCULAR ACCIDENT,  HX OF 12/22/2008  . Diabetes mellitus, type II ([place]) 07/30/2006  . OBESITY, NOS 07/30/2006  . Essential hypertension, benign 07/30/2006    Kseniya Grunden, PT 12/27/2015, 2:16 PM  [facility] [address] [place], [place], [address] Phone: [phone]   Fax:  [phone]  Name: [name] MRN: [id number] Date of Birth: 1950-10-17

## 2015-12-28 ENCOUNTER — Encounter: Payer: Self-pay | Admitting: Physical Therapy

## 2015-12-28 ENCOUNTER — Ambulatory Visit: Payer: Medicare Other | Admitting: Physical Therapy

## 2015-12-28 DIAGNOSIS — R2681 Unsteadiness on feet: Secondary | ICD-10-CM

## 2015-12-28 DIAGNOSIS — M6281 Muscle weakness (generalized): Secondary | ICD-10-CM | POA: Diagnosis not present

## 2015-12-28 DIAGNOSIS — R2689 Other abnormalities of gait and mobility: Secondary | ICD-10-CM

## 2015-12-30 NOTE — Therapy (Signed)
[facility] [address] [place], [place], [address] Phone: [phone]   Fax:  [phone]  Physical Therapy Treatment  Patient Details  Name: [name] MRN: [id number] Date of Birth: 1951-05-09 Referring Provider: Tawanna Sat, MD  Encounter Date: 12/28/2015   12/28/15 1110  PT Visits / Re-Eval  Visit Number 12  Number of Visits 16  Date for PT Re-Evaluation 01/01/16  Authorization  Authorization Type G code every 10th visit  PT Time Calculation  PT Start Time 1106  PT Stop Time 1145  PT Time Calculation (min) 39 min  PT - End of Session  Equipment Utilized During Treatment Gait belt  Activity Tolerance Patient tolerated treatment well  Behavior During Therapy [facility] for tasks assessed/performed     Past Medical History:  Diagnosis Date  . CVA (cerebral infarction) 01/03/2008   MRI: Acute 1 x 1.5 cm infarction affecting the left side of the pons.  . Diabetes mellitus   . DVT (deep venous thrombosis) ([place])   . Hypertension   . PAD (peripheral artery disease) ([place])   . Stroke [facility])     Past Surgical History:  Procedure Laterality Date  . None      There were no vitals filed for this visit.     12/28/15 1109  Symptoms/Limitations  Subjective No new complaints. Reports the new HEP "it works me".   Patient Stated Goals consider power w/c and work on balance/walking.    Pain Assessment  Currently in Pain? No/denies  Pain Score 0    Treatment: Reviewed newly issued HEP with pt performing in session today with handouts. Minimal cues needed on correct performance and ex form/technique.   Issued information for [employer] [facility] to pt for community fitness options. Reviewed this and pt's progress toward goals. Discussed plan to discharge at next visit.            PT Short Term Goals - 11/29/15 1406      PT SHORT TERM GOAL #1   Title The patient will return demo HEP with written cues.    Baseline Target date 12/02/2015   Time 4   Period Weeks   Status On-going     PT SHORT TERM GOAL #2   Title The patient will improve Berg from 27/56 to > or equal to 33/56 to demo dec'ing risk for falls.   Status Achieved     PT SHORT TERM GOAL #3   Title The patient will improve gait speed from 1.02 ft/sec to > or equal to 1.3 ft/sec to transition to "limited community ambulator"   Baseline Patient scores 1.04 ft/sec on 11/26/2015 with cues to move at a faster pace.   Status Partially Met     PT SHORT TERM GOAL #4   Title The patient will negotiate 4 steps with step to pattern and one handrail with supervision    Baseline Met on 11/26/2015   Status Achieved           PT Long Term Goals - 12/26/15 0901      PT LONG TERM GOAL #1   Title The patient will be indep with progression of HEP for post d/c.   Baseline Target date 01/01/2016   Time 8   Period Weeks   Status On-going     PT LONG TERM GOAL #2   Title The patient will improve Berg from 27/56 to > or equa lto 36/56 to demo dec'd risk for falls.   Baseline  11/23/15: 41/56 scored   Status Achieved     PT LONG TERM GOAL #3   Title The patient will improve functional status score from 40% to > or equal to 50% for improved self perception of mobility.   Baseline Target date 01/01/2016   Time 8   Period Weeks   Status On-going     PT LONG TERM GOAL #4   Title The aptient will improve gait speed from 1.02 ft/sec to > orequal to 1.6 ft/sec.   Baseline Met on 12/26/2015 scoring 2.04 ft/sec with quad cane.   Time 8   Period Weeks   Status Achieved     PT LONG TERM GOAL #5   Title The patient will have further equipment needs addressed, as indicated (w/c, AFO).   Baseline Target date 01/01/2016   Time 8   Period Weeks   Status On-going        12/28/15 1110  Plan  Clinical Impression Statement Pt was able to perform newly issued HEP with handout and minimal cues needed on form/technique. Discussed pt's progress toward goals  and PT's plan for discharge at next session.  Pt agreeable to this and to bring any other questions he has at that time. Issued information on [facility] today as well for pt to check into for community fitness options.                         Pt will benefit from skilled therapeutic intervention in order to improve on the following deficits Abnormal gait;Decreased balance;Decreased strength;Difficulty walking;Impaired tone;Decreased endurance;Decreased mobility  PT Treatment/Interventions ADLs/Self Care Home Management;Balance training;Neuromuscular re-education;Gait training;Patient/family education;Therapeutic activities;Therapeutic exercise;Functional mobility training;Stair training;Orthotic Fit/Training;Wheelchair mobility training  PT Next Visit Plan Check remaining LTGs and discharge per PT plan of care.  Consulted and Agree with Plan of Care Patient          Patient will benefit from skilled therapeutic intervention in order to improve the following deficits and impairments:  Abnormal gait, Decreased balance, Decreased strength, Difficulty walking, Impaired tone, Decreased endurance, Decreased mobility  Visit Diagnosis: Muscle weakness (generalized)  Other abnormalities of gait and mobility  Unsteadiness on feet     Problem List Patient Active Problem List   Diagnosis Date Noted  . Hemiparesis affecting right side as late effect of stroke ([place]) 10/23/2015  . PAD (peripheral artery disease) ([place]) 10/20/2011  . Hyperlipidemia 02/21/2010  . CEREBROVASCULAR ACCIDENT, HX OF 12/22/2008  . Diabetes mellitus, type II ([place]) 07/30/2006  . OBESITY, NOS 07/30/2006  . Essential hypertension, benign 07/30/2006    [name], PTA, [facility] [address], [place], [place] [address] [phone] 12/30/15, 3:14 PM   Name: [name] MRN: [id number] Date of Birth: Nov 25, 1950

## 2016-01-01 ENCOUNTER — Encounter: Payer: Self-pay | Admitting: Physical Therapy

## 2016-01-01 ENCOUNTER — Ambulatory Visit: Payer: Medicare Other | Attending: Family Medicine | Admitting: Physical Therapy

## 2016-01-01 DIAGNOSIS — R2681 Unsteadiness on feet: Secondary | ICD-10-CM | POA: Diagnosis not present

## 2016-01-01 DIAGNOSIS — R2689 Other abnormalities of gait and mobility: Secondary | ICD-10-CM | POA: Insufficient documentation

## 2016-01-01 DIAGNOSIS — M6281 Muscle weakness (generalized): Secondary | ICD-10-CM | POA: Diagnosis not present

## 2016-01-02 NOTE — Therapy (Signed)
[facility] [address] [place], [place], [address] Phone: [phone]   Fax:  [phone]  Physical Therapy Treatment  Patient Details  Name: [name] MRN: [id number] Date of Birth: 11/01/1950 Referring Provider: Andrew Wight, MD  Encounter Date: 01/01/2016      PT End of Session - 01/01/16 0938    Visit Number 13   Number of Visits 16   Date for PT Re-Evaluation 01/01/16   Authorization Type G code every 10th visit   PT Start Time 0932   PT Stop Time 1003  discharge visist, not all time was needed   PT Time Calculation (min) 31 min   Equipment Utilized During Treatment Gait belt   Activity Tolerance Patient tolerated treatment well   Behavior During Therapy W[facility] for tasks assessed/performed      Past Medical History:  Diagnosis Date  . CVA (cerebral infarction) 01/03/2008   MRI: Acute 1 x 1.5 cm infarction affecting the left side of the pons.  . Diabetes mellitus   . DVT (deep venous thrombosis) ([facility])   . Hypertension   . PAD (peripheral artery disease) ([facility])   . Stroke ([facility])     Past Surgical History:  Procedure Laterality Date  . None      There were no vitals filed for this visit.      Subjective Assessment - 01/01/16 0938    Subjective No new complaints. No falls or pain to report. Does report stiffness in right knee. Has called [facility] and is planning to go out there later this week to complete all the paperwork.    Patient Stated Goals consider power w/c and work on balance/walking.     Currently in Pain? No/denies   Pain Score 0-No pain      Treatment: Exercises Pt able to perform all current HEP exercises with handout with supervision only, no cues needed.  Self care: Discussed [facility] and which equipment he can use safely (ie seated bike yes, treadmill not safe) by him self. Discussed weight equipment he can safely use as well. Pt plans to meet with trainer there as  well to get established on a good fitness plan. Pt no longer interested in getting wheelchair at this time as he feels his mobility has greatly improved and he does not need one at this time.            PT Short Term Goals - 11/29/15 1406      PT SHORT TERM GOAL #1   Title The patient will return demo HEP with written cues.   Baseline Target date 12/02/2015   Time 4   Period Weeks   Status On-going     PT SHORT TERM GOAL #2   Title The patient will improve Berg from 27/56 to > or equal to 33/56 to demo dec'ing risk for falls.   Status Achieved     PT SHORT TERM GOAL #3   Title The patient will improve gait speed from 1.02 ft/sec to > or equal to 1.3 ft/sec to transition to "limited community ambulator"   Baseline Patient scores 1.04 ft/sec on 11/26/2015 with cues to move at a faster pace.   Status Partially Met     PT SHORT TERM GOAL #4   Title The patient will negotiate 4 steps with step to pattern and one handrail with supervision    Baseline Met on 11/26/2015   Status Achieved             PT Long Term Goals - 01/01/16 0942      PT LONG TERM GOAL #1   Title The patient will be indep with progression of HEP for post d/c.   Baseline met on 01/01/16   Status Achieved     PT LONG TERM GOAL #2   Title The patient will improve Berg from 27/56 to > or equal to 36/56 to demo dec'd risk for falls.   Baseline 12/21/15: scored 42/56   Status Achieved     PT LONG TERM GOAL #3   Title The patient will improve functional status score from 40% to > or equal to 50% for improved self perception of mobility.   Baseline 01/01/16: pt scored 61%   Time --   Period --   Status Achieved     PT LONG TERM GOAL #4   Title The aptient will improve gait speed from 1.02 ft/sec to > orequal to 1.6 ft/sec.   Baseline Met on 12/26/2015 scoring 2.04 ft/sec with quad cane.   Time --   Period --   Status Achieved     PT LONG TERM GOAL #5   Title The patient will have further equipment needs  addressed, as indicated (w/c, AFO).   Baseline 01/01/16: pt no longer interestd in obtaining power or manual wheelchair at this time. current AFO is working for him at this time.   Status Achieved               Plan - 01/01/16 0938    Clinical Impression Statement All LTGs have been met. Pt is agreeable to discharge today.   PT Treatment/Interventions ADLs/Self Care Home Management;Balance training;Neuromuscular re-education;Gait training;Patient/family education;Therapeutic activities;Therapeutic exercise;Functional mobility training;Stair training;Orthotic Fit/Training;Wheelchair mobility training   PT Next Visit Plan discharge per PT plan of care   Consulted and Agree with Plan of Care Patient      Patient will benefit from skilled therapeutic intervention in order to improve the following deficits and impairments:  Abnormal gait, Decreased balance, Decreased strength, Difficulty walking, Impaired tone, Decreased endurance, Decreased mobility  Visit Diagnosis: Muscle weakness (generalized)  Other abnormalities of gait and mobility  Unsteadiness on feet       G-Codes - 01/01/16 1630    Functional Assessment Tool Used Berg 42/56      Problem List Patient Active Problem List   Diagnosis Date Noted  . Hemiparesis affecting right side as late effect of stroke ([facility]) 10/23/2015  . PAD (peripheral artery disease) ([facility]) 10/20/2011  . Hyperlipidemia 02/21/2010  . CEREBROVASCULAR ACCIDENT, HX OF 12/22/2008  . Diabetes mellitus, type II ([facility]) 07/30/2006  . OBESITY, NOS 07/30/2006  . Essential hypertension, benign 07/30/2006     , PTA, [facility] [address], [address] [place], [place] [address] [phone] 01/02/16, 11:27 AM   Name: [name] MRN: [id number] Date of Birth: 01/18/1951    

## 2016-01-04 ENCOUNTER — Encounter: Payer: Self-pay | Admitting: Rehabilitative and Restorative Service Providers"

## 2016-01-04 ENCOUNTER — Ambulatory Visit: Payer: Medicare Other | Admitting: Rehabilitative and Restorative Service Providers"

## 2016-01-04 NOTE — Therapy (Signed)
[facility] [address] [place], [place], [address] Phone: [phone]   Fax:  [phone]  Patient Details  Name: [name] MRN: [id number] Date of Birth: [birth year] Referring Provider:  No ref. provider found  Encounter Date: last encounter 01/01/2016  PHYSICAL THERAPY DISCHARGE SUMMARY  Visits from Start of Care: 13  Current functional level related to goals / functional outcomes:     PT Short Term Goals - 11/29/15 1406      PT SHORT TERM GOAL #1   Title The patient will return demo HEP with written cues.   Baseline Target date 12/02/2015   Time 4   Period Weeks   Status On-going     PT SHORT TERM GOAL #2   Title The patient will improve Berg from 27/56 to > or equal to 33/56 to demo dec'ing risk for falls.   Status Achieved     PT SHORT TERM GOAL #3   Title The patient will improve gait speed from 1.02 ft/sec to > or equal to 1.3 ft/sec to transition to "limited community ambulator"   Baseline Patient scores 1.04 ft/sec on 11/26/2015 with cues to move at a faster pace.   Status Partially Met     PT SHORT TERM GOAL #4   Title The patient will negotiate 4 steps with step to pattern and one handrail with supervision    Baseline Met on 11/26/2015   Status Achieved         PT Long Term Goals - 01/01/16 0942      PT LONG TERM GOAL #1   Title The patient will be indep with progression of HEP for post d/c.   Baseline met on 01/01/16   Status Achieved     PT LONG TERM GOAL #2   Title The patient will improve Berg from 27/56 to > or equal to 36/56 to demo dec'd risk for falls.   Baseline 12/21/15: scored 42/56   Status Achieved     PT LONG TERM GOAL #3   Title The patient will improve functional status score from 40% to > or equal to 50% for improved self perception of mobility.   Baseline 01/01/16: pt scored 61%   Time --   Period --   Status Achieved     PT LONG TERM GOAL #4   Title The aptient will  improve gait speed from 1.02 ft/sec to > orequal to 1.6 ft/sec.   Baseline Met on 12/26/2015 scoring 2.04 ft/sec with quad cane.   Time --   Period --   Status Achieved     PT LONG TERM GOAL #5   Title The patient will have further equipment needs addressed, as indicated (w/c, AFO).   Baseline 01/01/16: pt no longer interestd in obtaining power or manual wheelchair at this time. current AFO is working for him at this time.   Status Achieved        Remaining deficits: Chronic deficits related to h/o CVA   Education / Equipment: Home program, increasing mobility.  Plan: Patient agrees to discharge.  Patient goals were met. Patient is being discharged due to meeting the stated rehab goals.  ?????        Thank you for the referral of this patient. Rudell Cobb, MPT   Shelbi Vaccaro 01/04/2016, 1:48 PM  [facility] [address] [place], [place], [address] Phone: [phone]   Fax:  [phone]

## 2016-01-08 NOTE — Telephone Encounter (Signed)
Re-faxed the anti-coag letter to [name] MD for plavix clearance , looks like Dr Tawanna Sat is not his PCP anymore.  Both Dr's work/worked at [facility].

## 2016-01-10 ENCOUNTER — Encounter: Payer: Self-pay | Admitting: Internal Medicine

## 2016-01-11 NOTE — Telephone Encounter (Signed)
Nassir called and I informed him to hold the plavix 5 days prior to colonoscopy, he verbalized understanding.

## 2016-01-11 NOTE — Telephone Encounter (Signed)
Dr [name] at [facility] answered back , see under the letter tab, that ok to hold Plavix 5 days prior to colonoscopy.  Left message for patient to call me.

## 2016-01-29 ENCOUNTER — Ambulatory Visit (AMBULATORY_SURGERY_CENTER): Payer: Medicare Other | Admitting: Internal Medicine

## 2016-01-29 ENCOUNTER — Encounter: Payer: Self-pay | Admitting: Internal Medicine

## 2016-01-29 ENCOUNTER — Telehealth: Payer: Self-pay | Admitting: Gastroenterology

## 2016-01-29 VITALS — BP 107/42 | HR 53 | Temp 97.8°F | Resp 14 | Ht 67.0 in | Wt 252.0 lb

## 2016-01-29 DIAGNOSIS — Z1211 Encounter for screening for malignant neoplasm of colon: Secondary | ICD-10-CM

## 2016-01-29 LAB — GLUCOSE, CAPILLARY
GLUCOSE-CAPILLARY: 222 mg/dL — AB (ref 65–99)
GLUCOSE-CAPILLARY: 144 mg/dL — AB (ref 65–99)

## 2016-01-29 MED ORDER — SODIUM CHLORIDE 0.9 % IV SOLN: 500.0000 mL | INTRAVENOUS | Status: DC

## 2016-01-29 NOTE — Progress Notes (Signed)
A and O x3. Report to RN. Tolerated MAC anesthesia well. 

## 2016-01-29 NOTE — Op Note (Signed)
[name]: [name] Procedure Date: 01/29/2016 2:40 PM MRN: [id number] Endoscopist: Gatha Mayer , MD Age: 65 Referring MD:  Date of Birth: 07-Oct-1950 Gender: Male Account #: [id number] Procedure:                Colonoscopy Indications:              Screening for colorectal malignant neoplasm Medicines:                Propofol per Anesthesia, Monitored Anesthesia Care Procedure:                Pre-Anesthesia Assessment:                           - Prior to the procedure, a History and Physical                            was performed, and patient medications and                            allergies were reviewed. The patient's tolerance of                            previous anesthesia was also reviewed. The risks                            and benefits of the procedure and the sedation                            options and risks were discussed with the patient.                            All questions were answered, and informed consent                            was obtained. Prior Anticoagulants: The patient                            last took Plavix (clopidogrel) 5 days prior to the                            procedure. ASA Grade Assessment: III - A patient                            with severe systemic disease. After reviewing the                            risks and benefits, the patient was deemed in                            satisfactory condition to undergo the procedure.                           After obtaining informed consent, the colonoscope  was passed under direct vision. Throughout the                            procedure, the patient's blood pressure, pulse, and                            oxygen saturations were monitored continuously. The                            Model CF-HQ190L [id number]) scope was introduced                            through the anus and advanced to the the cecum,             identified by appendiceal orifice and ileocecal                            valve. The ileocecal valve, appendiceal orifice,                            and rectum were photographed. The quality of the                            bowel preparation was adequate. The patient                            tolerated the procedure well. The bowel preparation                            used was Miralax. The colonoscopy was somewhat                            difficult due to significant looping. Successful                            completion of the procedure was aided by changing                            the patient to a prone position, using manual                            pressure and straightening and shortening the scope                            to obtain bowel loop reduction. Also used forceps                            to manipulate the colon. Scope In: 2:45:49 PM Scope Out: 3:05:56 PM Scope Withdrawal Time: 0 hours 11 minutes 23 seconds  Total Procedure Duration: 0 hours 20 minutes 7 seconds  Findings:                 The perianal and digital rectal examinations were  normal. Pertinent negatives include normal prostate                            (size, shape, and consistency).                           The colon (entire examined portion) appeared normal                            though it was somewhart redundant - see                            manipulations to complet exam under procedure.                           No additional abnormalities were found on                            retroflexion. Complications:            No immediate complications. Estimated blood loss:                            None. Estimated Blood Loss:     Estimated blood loss: none. Impression:               - The entire examined colon is normal but redundant.                           - No specimens collected. Recommendation:           - Repeat colonoscopy possibly in 10  years for                            screening purposes.                           - Patient has a contact number available for                            emergencies. The signs and symptoms of potential                            delayed complications were discussed with the                            patient. Return to normal activities tomorrow.                            Written discharge instructions were provided to the                            patient.                           - Resume previous diet.                           -  Continue present medications.                           - Patient has a contact number available for                            emergencies. The signs and symptoms of potential                            delayed complications were discussed with the                            patient. Return to normal activities tomorrow.                            Written discharge instructions were provided to the                            patient.                           - Resume previous diet.                           - Continue present medications.                           - Resume Plavix (clopidogrel) at prior dose today. Gatha Mayer, MD 01/29/2016 3:12:10 PM This report has been signed electronically.

## 2016-01-29 NOTE — Telephone Encounter (Signed)
Patient didn't have a bm after drinking half the dose by 10:30 pm. Advised him to drink additional miralax with gatorade but patient didn't have any and didn't think he could go to pharmacy. He will try to drink more fluid and complete the split dose in the am as per instruction. He will call back in the am

## 2016-01-29 NOTE — Patient Instructions (Addendum)
   No polyps or cancer seen - normal exam.  Consider repeat screening in 10 years.  Restart Plavix today.  I appreciate the opportunity to care for you. Gatha Mayer, MD, FACG  YOU HAD AN ENDOSCOPIC PROCEDURE TODAY AT [facility]:   Refer to the procedure report that was given to you for any specific questions about what was found during the examination.  If the procedure report does not answer your questions, please call your gastroenterologist to clarify.  If you requested that your care partner not be given the details of your procedure findings, then the procedure report has been included in a sealed envelope for you to review at your convenience later.  YOU SHOULD EXPECT: Some feelings of bloating in the abdomen. Passage of more gas than usual.  Walking can help get rid of the air that was put into your GI tract during the procedure and reduce the bloating. If you had a lower endoscopy (such as a colonoscopy or flexible sigmoidoscopy) you may notice spotting of blood in your stool or on the toilet paper. If you underwent a bowel prep for your procedure, you may not have a normal bowel movement for a few days.  Please Note:  You might notice some irritation and congestion in your nose or some drainage.  This is from the oxygen used during your procedure.  There is no need for concern and it should clear up in a day or so.  SYMPTOMS TO REPORT IMMEDIATELY:   Following lower endoscopy (colonoscopy or flexible sigmoidoscopy):  Excessive amounts of blood in the stool  Significant tenderness or worsening of abdominal pains  Swelling of the abdomen that is new, acute  Fever of 100F or higher  For urgent or emergent issues, a gastroenterologist can be reached at any hour by calling [phone].   DIET:  We do recommend a small meal at first, but then you may proceed to your regular diet.  Drink plenty of fluids but you should avoid alcoholic beverages for 24  hours.  ACTIVITY:  You should plan to take it easy for the rest of today and you should NOT DRIVE or use heavy machinery until tomorrow (because of the sedation medicines used during the test).    FOLLOW UP: Our staff will call the number listed on your records the next business day following your procedure to check on you and address any questions or concerns that you may have regarding the information given to you following your procedure. If we do not reach you, we will leave a message.  However, if you are feeling well and you are not experiencing any problems, there is no need to return our call.  We will assume that you have returned to your regular daily activities without incident.  If any biopsies were taken you will be contacted by phone or by letter within the next 1-3 weeks.  Please call us at [phone] if you have not heard about the biopsies in 3 weeks.    SIGNATURES/CONFIDENTIALITY: You and/or your care partner have signed paperwork which will be entered into your electronic medical record.  These signatures attest to the fact that that the information above on your After Visit Summary has been reviewed and is understood.  Full responsibility of the confidentiality of this discharge information lies with you and/or your care-partner.

## 2016-01-30 ENCOUNTER — Telehealth: Payer: Self-pay | Admitting: *Deleted

## 2016-01-30 NOTE — Telephone Encounter (Signed)
  Follow up Call-  Call back number 01/29/2016  Post procedure Call Back phone  # [phone]  Some recent data might be hidden     No answer, pt identifier so left message to call nack if having problems or has questions

## 2016-03-12 DIAGNOSIS — Z23 Encounter for immunization: Secondary | ICD-10-CM | POA: Diagnosis not present

## 2016-06-17 ENCOUNTER — Other Ambulatory Visit: Payer: Self-pay | Admitting: Family Medicine

## 2016-06-29 ENCOUNTER — Encounter (HOSPITAL_COMMUNITY): Payer: Self-pay | Admitting: *Deleted

## 2016-06-29 ENCOUNTER — Inpatient Hospital Stay (HOSPITAL_COMMUNITY)
Admission: EM | Admit: 2016-06-29 | Discharge: 2016-07-04 | DRG: 064 | Disposition: A | Discharge status: Skilled Nursing Facility | Payer: Medicare Other | Attending: Internal Medicine | Admitting: Internal Medicine

## 2016-06-29 ENCOUNTER — Emergency Department (HOSPITAL_COMMUNITY): Payer: Medicare Other

## 2016-06-29 DIAGNOSIS — N179 Acute kidney failure, unspecified: Secondary | ICD-10-CM | POA: Diagnosis present

## 2016-06-29 DIAGNOSIS — I63522 Cerebral infarction due to unspecified occlusion or stenosis of left anterior cerebral artery: Principal | ICD-10-CM | POA: Diagnosis present

## 2016-06-29 DIAGNOSIS — Z23 Encounter for immunization: Secondary | ICD-10-CM | POA: Diagnosis not present

## 2016-06-29 DIAGNOSIS — A084 Viral intestinal infection, unspecified: Secondary | ICD-10-CM | POA: Diagnosis present

## 2016-06-29 DIAGNOSIS — E860 Dehydration: Secondary | ICD-10-CM | POA: Diagnosis not present

## 2016-06-29 DIAGNOSIS — G9341 Metabolic encephalopathy: Secondary | ICD-10-CM | POA: Diagnosis not present

## 2016-06-29 DIAGNOSIS — R296 Repeated falls: Secondary | ICD-10-CM | POA: Diagnosis present

## 2016-06-29 DIAGNOSIS — R4182 Altered mental status, unspecified: Secondary | ICD-10-CM

## 2016-06-29 DIAGNOSIS — Z7982 Long term (current) use of aspirin: Secondary | ICD-10-CM

## 2016-06-29 DIAGNOSIS — R918 Other nonspecific abnormal finding of lung field: Secondary | ICD-10-CM | POA: Diagnosis not present

## 2016-06-29 DIAGNOSIS — R531 Weakness: Secondary | ICD-10-CM | POA: Diagnosis not present

## 2016-06-29 DIAGNOSIS — E118 Type 2 diabetes mellitus with unspecified complications: Secondary | ICD-10-CM

## 2016-06-29 DIAGNOSIS — I639 Cerebral infarction, unspecified: Secondary | ICD-10-CM

## 2016-06-29 DIAGNOSIS — E119 Type 2 diabetes mellitus without complications: Secondary | ICD-10-CM

## 2016-06-29 DIAGNOSIS — R29810 Facial weakness: Secondary | ICD-10-CM | POA: Diagnosis present

## 2016-06-29 DIAGNOSIS — D72829 Elevated white blood cell count, unspecified: Secondary | ICD-10-CM

## 2016-06-29 DIAGNOSIS — E1151 Type 2 diabetes mellitus with diabetic peripheral angiopathy without gangrene: Secondary | ICD-10-CM | POA: Diagnosis present

## 2016-06-29 DIAGNOSIS — E785 Hyperlipidemia, unspecified: Secondary | ICD-10-CM | POA: Diagnosis present

## 2016-06-29 DIAGNOSIS — I129 Hypertensive chronic kidney disease with stage 1 through stage 4 chronic kidney disease, or unspecified chronic kidney disease: Secondary | ICD-10-CM | POA: Diagnosis present

## 2016-06-29 DIAGNOSIS — Z7902 Long term (current) use of antithrombotics/antiplatelets: Secondary | ICD-10-CM

## 2016-06-29 DIAGNOSIS — R404 Transient alteration of awareness: Secondary | ICD-10-CM | POA: Diagnosis not present

## 2016-06-29 DIAGNOSIS — R6520 Severe sepsis without septic shock: Secondary | ICD-10-CM | POA: Diagnosis present

## 2016-06-29 DIAGNOSIS — Z794 Long term (current) use of insulin: Secondary | ICD-10-CM

## 2016-06-29 DIAGNOSIS — E1142 Type 2 diabetes mellitus with diabetic polyneuropathy: Secondary | ICD-10-CM | POA: Diagnosis present

## 2016-06-29 DIAGNOSIS — E1122 Type 2 diabetes mellitus with diabetic chronic kidney disease: Secondary | ICD-10-CM | POA: Diagnosis not present

## 2016-06-29 DIAGNOSIS — W19XXXA Unspecified fall, initial encounter: Secondary | ICD-10-CM | POA: Diagnosis present

## 2016-06-29 DIAGNOSIS — F329 Major depressive disorder, single episode, unspecified: Secondary | ICD-10-CM | POA: Diagnosis present

## 2016-06-29 DIAGNOSIS — I69351 Hemiplegia and hemiparesis following cerebral infarction affecting right dominant side: Secondary | ICD-10-CM

## 2016-06-29 DIAGNOSIS — R197 Diarrhea, unspecified: Secondary | ICD-10-CM | POA: Diagnosis present

## 2016-06-29 DIAGNOSIS — R471 Dysarthria and anarthria: Secondary | ICD-10-CM

## 2016-06-29 DIAGNOSIS — Z86718 Personal history of other venous thrombosis and embolism: Secondary | ICD-10-CM

## 2016-06-29 DIAGNOSIS — I10 Essential (primary) hypertension: Secondary | ICD-10-CM | POA: Diagnosis present

## 2016-06-29 DIAGNOSIS — G9340 Encephalopathy, unspecified: Secondary | ICD-10-CM | POA: Diagnosis present

## 2016-06-29 DIAGNOSIS — N182 Chronic kidney disease, stage 2 (mild): Secondary | ICD-10-CM | POA: Diagnosis present

## 2016-06-29 DIAGNOSIS — A419 Sepsis, unspecified organism: Secondary | ICD-10-CM | POA: Diagnosis present

## 2016-06-29 DIAGNOSIS — Z7901 Long term (current) use of anticoagulants: Secondary | ICD-10-CM

## 2016-06-29 HISTORY — DX: Kidney transplant rejection: T86.11

## 2016-06-29 LAB — CBC WITH DIFFERENTIAL/PLATELET
Basophils Absolute: 0 10*3/uL (ref 0.0–0.1)
Basophils Relative: 0 %
EOS PCT: 0 %
Eosinophils Absolute: 0 10*3/uL (ref 0.0–0.7)
HCT: 37.6 % — ABNORMAL LOW (ref 39.0–52.0)
Hemoglobin: 12.7 g/dL — ABNORMAL LOW (ref 13.0–17.0)
LYMPHS PCT: 10 %
Lymphs Abs: 2.2 10*3/uL (ref 0.7–4.0)
MCH: 31 pg (ref 26.0–34.0)
MCHC: 33.8 g/dL (ref 30.0–36.0)
MCV: 91.7 fL (ref 78.0–100.0)
MONO ABS: 2.4 10*3/uL — AB (ref 0.1–1.0)
Monocytes Relative: 10 %
Neutro Abs: 18.3 10*3/uL — ABNORMAL HIGH (ref 1.7–7.7)
Neutrophils Relative %: 80 %
PLATELETS: 142 10*3/uL — AB (ref 150–400)
RBC: 4.1 MIL/uL — ABNORMAL LOW (ref 4.22–5.81)
RDW: 12.7 % (ref 11.5–15.5)
WBC: 23 10*3/uL — ABNORMAL HIGH (ref 4.0–10.5)

## 2016-06-29 LAB — I-STAT CG4 LACTIC ACID, ED: Lactic Acid, Venous: 2.18 mmol/L (ref 0.5–1.9)

## 2016-06-29 MED ORDER — SODIUM CHLORIDE 0.9 % IV BOLUS (SEPSIS)
500.0000 mL | Freq: Once | INTRAVENOUS | Status: AC
  Administered 2016-06-29: 500 mL via INTRAVENOUS

## 2016-06-29 MED ORDER — SODIUM CHLORIDE 0.9 % IV SOLN
100.0000 mL/h | INTRAVENOUS | Status: DC
  Administered 2016-06-30 (×3): 100 mL/h via INTRAVENOUS

## 2016-06-29 NOTE — ED Triage Notes (Signed)
Per EMS, pt complains of lethargy x 2-3 days. Pt states he has foul smelling urine. Pt has hx of stroke with weakness on right side. Pt states he has had several falls over the weekend.

## 2016-06-29 NOTE — ED Provider Notes (Addendum)
[facility] DEPT Provider Note   CSN: [id number] Arrival date & time: 06/29/16  2217     History   Chief Complaint Chief Complaint  Patient presents with  . Fatigue    HPI [name] is a 66 y.o. male.  HPI  Patient presents with concern of weakness. Patient moves   at home, alone. Apparently family found him today, with particular weakness, listlessness. The patient has difficult to understand speech, but is oriented to place, to time. Initially the patient denies any complaints, including weakness, but after the initial interview, the patient suddenly states that he has had a fall within the past few days, acknowledges generalized weakness, denies specific pain. He does have history of prior stroke, multiple other medical issues. Patient has baseline right-sided weakness, he states that this is unchanged.  EMS reports the patient was soiled with stool, on their arrival.  Past Medical History:  Diagnosis Date  . CVA (cerebral infarction) 01/03/2008   MRI: Acute 1 x 1.5 cm infarction affecting the left side of the pons.  . Diabetes mellitus   . DVT (deep venous thrombosis) ([place])   . Hypertension   . PAD (peripheral artery disease) ([place])   . Stroke [facility])    2008    Patient Active Problem List   Diagnosis Date Noted  . Hemiparesis affecting right side as late effect of stroke ([place]) 10/23/2015  . PAD (peripheral artery disease) ([place]) 10/20/2011  . Hyperlipidemia 02/21/2010  . CEREBROVASCULAR ACCIDENT, HX OF 12/22/2008  . Diabetes mellitus, type II ([place]) 07/30/2006  . OBESITY, NOS 07/30/2006  . Essential hypertension, benign 07/30/2006    Past Surgical History:  Procedure Laterality Date  . None         Home Medications    Prior to Admission medications   Medication Sig Start Date End Date Taking? Authorizing Provider  amLODipine (NORVASC) 10 MG tablet TAKE 1 TABLET (10 MG TOTAL) BY MOUTH DAILY. 08/16/15   Leone Brand, MD  aspirin 81 MG tablet  Take 1 tablet (81 mg total) by mouth daily. 06/04/12   Josalyn Funches, MD  atorvastatin (LIPITOR) 40 MG tablet Take 1 tablet (40 mg total) by mouth daily. 08/21/15   Leone Brand, MD  B-D ULTRAFINE III SHORT PEN 31G X 8 MM MISC USE WITH LEVEMIR 08/17/15   Leone Brand, MD  baclofen (LIORESAL) 10 MG tablet TAKE 1/2 TABLET BY MOUTH 1 time a DAY AS NEEDED FOR MUSCLE SPASMS 03/17/14   Hilton Sinclair, MD  Blood Gluc Meter Disp-Strips [facility] BLOOD GLUCOSE SYSTEM) DEVI Use for daily testing 08/25/13   Hilton Sinclair, MD  Blood Pressure Monitoring (BLOOD PRESSURE CUFF) MISC by Does not apply route. Pt needs a large cuff     Historical Provider, MD  carvedilol (COREG) 25 MG tablet TAKE 1 TABLET (25 MG TOTAL) BY MOUTH 2 (TWO) TIMES DAILY WITH A MEAL. 08/21/15   Leone Brand, MD  clopidogrel (PLAVIX) 75 MG tablet TAKE 1 TABLET (75 MG TOTAL) BY MOUTH DAILY. 08/21/15   Leone Brand, MD  fish oil-omega-3 fatty acids 1000 MG capsule Take 1 g by mouth daily.    Historical Provider, MD  hydrochlorothiazide (HYDRODIURIL) 25 MG tablet TAKE 1 TABLET (25 MG TOTAL) BY MOUTH DAILY. 08/17/15   Leone Brand, MD  insulin aspart (NOVOLOG FLEXPEN) 100 UNIT/ML FlexPen Inject 15 Units into the skin 2 (two) times daily with a meal. 09/25/15   Leone Brand, MD  Insulin Syringe-Needle  U-100 28G X 1/2" 1 ML MISC 1 each by Does not apply route 3 (three) times daily. 06/29/15   Leone Brand, MD  LEVEMIR FLEXTOUCH 100 UNIT/ML Pen INJECT 30 UNITS INTO THE SKIN DAILY 08/10/15   Leone Brand, MD  lisinopril (PRINIVIL,ZESTRIL) 40 MG tablet TAKE 1 TABLET (40 MG TOTAL) BY MOUTH DAILY. 08/21/15   Leone Brand, MD  Misc. Devices (QUAD CANE/SMALL BASE) MISC by Does not apply route.      Historical Provider, MD  sertraline (ZOLOFT) 100 MG tablet TAKE 1 TABLET BY MOUTH EVERY DAY 06/17/16   Asiyah Cletis Media, MD  sildenafil (REVATIO) 20 MG tablet Take 2-3 tablets (40-60 mg total) by mouth daily as needed (intercourse).  11/22/12   Boykin Nearing, MD  spironolactone (ALDACTONE) 50 MG tablet Take 1 tablet (50 mg total) by mouth daily. 11/30/15   Leone Brand, MD  Tdap Durwin Reges) 5-2.5-18.5 LF-MCG/0.5 injection Inject 0.5 mLs into the muscle once. 11/14/15   Leone Brand, MD    Family History Family History  Problem Relation Age of Onset  . Diabetes Mother   . Heart attack Mother   . Hypertension Mother   . Diabetes Sister   . Hypertension Sister   . Hypertension Brother   . Hypertension Daughter   . Hypertension Son   . Colon cancer Neg Hx   . Esophageal cancer Neg Hx   . Stomach cancer Neg Hx   . Rectal cancer Neg Hx     Social History Social History  Substance Use Topics  . Smoking status: Never Smoker  . Smokeless tobacco: Never Used  . Alcohol use No     Allergies   Patient has no known allergies.   Review of Systems Review of Systems  Constitutional:       Per HPI, otherwise negative  HENT:       Per HPI, otherwise negative  Respiratory:       Per HPI, otherwise negative  Cardiovascular:       Per HPI, otherwise negative  Gastrointestinal: Negative for vomiting.  Endocrine:       Negative aside from HPI  Genitourinary:       Neg aside from HPI   Musculoskeletal:       Per HPI, otherwise negative  Skin: Negative.   Allergic/Immunologic: Negative for immunocompromised state.  Neurological: Positive for weakness. Negative for syncope.       Prior cva    review of systems unreliable as the patient has difficult to understand speech, only answers questions intermittently  Physical Exam Updated Vital Signs There were no vitals taken for this visit.  Physical Exam  Constitutional:  Sickly appearing elderly large male, malodorous, disheveled  HENT:  Head: Normocephalic and atraumatic.  Eyes: Conjunctivae and EOM are normal.  Cardiovascular: Normal rate and regular rhythm.   Pulmonary/Chest: Effort normal. No stridor. No respiratory distress.  Abdominal: He exhibits  no distension.  Musculoskeletal: He exhibits no edema or deformity.  Neurological: He is alert. He displays atrophy. He displays no tremor. No cranial nerve deficit. He exhibits abnormal muscle tone.  Patient grossly oriented to self, place, time. Patient is otherwise a very poor historian, cannot provide details of the past 2 days. He acknowledges ongoing right-sided weakness, mild generalized weakness, denies substantial changes per   Skin: Skin is warm and dry.  Psychiatric: His speech is delayed. He is slowed and withdrawn. Cognition and memory are impaired.  Nursing note and vitals reviewed.  ED Treatments / Results  Labs (all labs ordered are listed, but only abnormal results are displayed) Labs Reviewed  COMPREHENSIVE METABOLIC PANEL - Abnormal; Notable for the following:       Result Value   Sodium 132 (*)    CO2 20 (*)    Glucose, Bld 159 (*)    BUN 27 (*)    Creatinine, Ser 1.35 (*)    Calcium 8.3 (*)    AST 70 (*)    ALT 122 (*)    GFR calc non Af Amer 54 (*)    All other components within normal limits  CBC WITH DIFFERENTIAL/PLATELET - Abnormal; Notable for the following:    WBC 23.0 (*)    RBC 4.10 (*)    Hemoglobin 12.7 (*)    HCT 37.6 (*)    Platelets 142 (*)    Neutro Abs 18.3 (*)    Monocytes Absolute 2.4 (*)    All other components within normal limits  PROTIME-INR - Abnormal; Notable for the following:    Prothrombin Time 15.8 (*)    All other components within normal limits  I-STAT CG4 LACTIC ACID, ED - Abnormal; Notable for the following:    Lactic Acid, Venous 2.18 (*)    All other components within normal limits  APTT  ETHANOL  TROPONIN I  RAPID URINE DRUG SCREEN, HOSP PERFORMED  URINALYSIS, ROUTINE W REFLEX MICROSCOPIC  BRAIN NATRIURETIC PEPTIDE    EKG  EKG Interpretation  Date/Time:  Sunday June 29 2016 22:44:43 EST Ventricular Rate:  62 PR Interval:    QRS Duration: 78 QT Interval:  394 QTC Calculation: 401 R Axis:   26 Text  Interpretation:  Age not entered, assumed to be  66 years old for purpose of ECG interpretation Sinus arrhythmia T wave abnormality Abnormal ekg Confirmed by Carmin Muskrat  MD [phone]) on 06/29/2016 10:53:08 PM       Radiology Dg Chest 2 View  Result Date: 06/30/2016 CLINICAL DATA:  Lethargy 2-3 days and foul-smelling urine. Several recent falls. EXAM: CHEST  2 VIEW COMPARISON:  01/08/2008 FINDINGS: Lungs are hypoinflated without focal airspace consolidation or effusion. Cardiomediastinal silhouette is within normal. There is calcified plaque over the aortic arch. There are degenerative changes of the spine. IMPRESSION: Hypoinflation without acute cardiopulmonary disease. Aortic atherosclerosis. Electronically Signed   By: Marin Olp M.D.   On: 06/30/2016 00:02    Procedures Procedures (including critical care time)  Medications Ordered in ED Medications  sodium chloride 0.9 % bolus 500 mL (not administered)    Followed by  0.9 %  sodium chloride infusion (not administered)   Initial labs notable for lactic acidosis, leukocytosis, elevated creatinine Patient has been received IV fluids.   Initial Impression / Assessment and Plan / ED Course  I have reviewed the triage vital signs and the nursing notes.  Pertinent labs & imaging results that were available during my care of the patient were reviewed by me and considered in my medical decision making (see chart for details).  12:37 AM Patient sleeping, seemingly comfortable, much improved since arrival. Family members and I discussed all findings thus far. Patient is receiving IV fluids, still has not produced a urine sample.  1:08 AM Patient continues to deny pain, and now he confirms that there are no skin changes, no new rash. He continues to appear weak. Still no urine sample.   With concern for altered mental status, weakness, leukocytosis, lactic acidosis, patient will be admitted for continued IV fluid  resuscitation,  evaluation.  Final Clinical Impressions(s) / ED Diagnoses  Altered mental status  Leukocytosis    Carmin Muskrat, MD 06/30/16 [id number]    Carmin Muskrat, MD 06/30/16 [phone]

## 2016-06-29 NOTE — ED Notes (Signed)
Upon assessment, pt covered in feces; pt's ex-wife states that pt fell several times in past 2 days and lost his bowels

## 2016-06-29 NOTE — ED Notes (Signed)
Placed a condom catheter on pt

## 2016-06-29 NOTE — ED Notes (Signed)
2 sets of blood cultures drawn at 2330hrs and are at pt's bedside if needed  1 yellow top 74ml (right hand) 1 red top 77ml (right AC) 1 blue top 44ml (right AC)

## 2016-06-29 NOTE — ED Notes (Signed)
Pt in X-Ray ?

## 2016-06-30 ENCOUNTER — Encounter (HOSPITAL_COMMUNITY): Payer: Self-pay | Admitting: Internal Medicine

## 2016-06-30 DIAGNOSIS — I679 Cerebrovascular disease, unspecified: Secondary | ICD-10-CM | POA: Diagnosis not present

## 2016-06-30 DIAGNOSIS — N182 Chronic kidney disease, stage 2 (mild): Secondary | ICD-10-CM

## 2016-06-30 DIAGNOSIS — Z86718 Personal history of other venous thrombosis and embolism: Secondary | ICD-10-CM | POA: Diagnosis not present

## 2016-06-30 DIAGNOSIS — R197 Diarrhea, unspecified: Secondary | ICD-10-CM | POA: Diagnosis not present

## 2016-06-30 DIAGNOSIS — W19XXXA Unspecified fall, initial encounter: Secondary | ICD-10-CM | POA: Diagnosis present

## 2016-06-30 DIAGNOSIS — E1122 Type 2 diabetes mellitus with diabetic chronic kidney disease: Secondary | ICD-10-CM | POA: Diagnosis present

## 2016-06-30 DIAGNOSIS — N179 Acute kidney failure, unspecified: Secondary | ICD-10-CM | POA: Diagnosis present

## 2016-06-30 DIAGNOSIS — Z7901 Long term (current) use of anticoagulants: Secondary | ICD-10-CM | POA: Diagnosis not present

## 2016-06-30 DIAGNOSIS — R531 Weakness: Secondary | ICD-10-CM | POA: Diagnosis not present

## 2016-06-30 DIAGNOSIS — G8111 Spastic hemiplegia affecting right dominant side: Secondary | ICD-10-CM | POA: Diagnosis not present

## 2016-06-30 DIAGNOSIS — I63522 Cerebral infarction due to unspecified occlusion or stenosis of left anterior cerebral artery: Secondary | ICD-10-CM | POA: Diagnosis not present

## 2016-06-30 DIAGNOSIS — Z7982 Long term (current) use of aspirin: Secondary | ICD-10-CM | POA: Diagnosis not present

## 2016-06-30 DIAGNOSIS — Z7902 Long term (current) use of antithrombotics/antiplatelets: Secondary | ICD-10-CM | POA: Diagnosis not present

## 2016-06-30 DIAGNOSIS — R4182 Altered mental status, unspecified: Secondary | ICD-10-CM | POA: Diagnosis not present

## 2016-06-30 DIAGNOSIS — R296 Repeated falls: Secondary | ICD-10-CM | POA: Diagnosis present

## 2016-06-30 DIAGNOSIS — R29810 Facial weakness: Secondary | ICD-10-CM | POA: Diagnosis present

## 2016-06-30 DIAGNOSIS — I10 Essential (primary) hypertension: Secondary | ICD-10-CM | POA: Diagnosis not present

## 2016-06-30 DIAGNOSIS — G8110 Spastic hemiplegia affecting unspecified side: Secondary | ICD-10-CM | POA: Diagnosis not present

## 2016-06-30 DIAGNOSIS — Z794 Long term (current) use of insulin: Secondary | ICD-10-CM | POA: Diagnosis not present

## 2016-06-30 DIAGNOSIS — E785 Hyperlipidemia, unspecified: Secondary | ICD-10-CM | POA: Diagnosis present

## 2016-06-30 DIAGNOSIS — I6789 Other cerebrovascular disease: Secondary | ICD-10-CM | POA: Diagnosis not present

## 2016-06-30 DIAGNOSIS — A419 Sepsis, unspecified organism: Secondary | ICD-10-CM | POA: Diagnosis present

## 2016-06-30 DIAGNOSIS — E118 Type 2 diabetes mellitus with unspecified complications: Secondary | ICD-10-CM | POA: Diagnosis not present

## 2016-06-30 DIAGNOSIS — E1142 Type 2 diabetes mellitus with diabetic polyneuropathy: Secondary | ICD-10-CM | POA: Diagnosis present

## 2016-06-30 DIAGNOSIS — A084 Viral intestinal infection, unspecified: Secondary | ICD-10-CM | POA: Diagnosis present

## 2016-06-30 DIAGNOSIS — Z23 Encounter for immunization: Secondary | ICD-10-CM | POA: Diagnosis not present

## 2016-06-30 DIAGNOSIS — I129 Hypertensive chronic kidney disease with stage 1 through stage 4 chronic kidney disease, or unspecified chronic kidney disease: Secondary | ICD-10-CM | POA: Diagnosis present

## 2016-06-30 DIAGNOSIS — G9340 Encephalopathy, unspecified: Secondary | ICD-10-CM | POA: Diagnosis present

## 2016-06-30 DIAGNOSIS — G9341 Metabolic encephalopathy: Secondary | ICD-10-CM | POA: Diagnosis present

## 2016-06-30 DIAGNOSIS — I639 Cerebral infarction, unspecified: Secondary | ICD-10-CM | POA: Diagnosis not present

## 2016-06-30 DIAGNOSIS — I69351 Hemiplegia and hemiparesis following cerebral infarction affecting right dominant side: Secondary | ICD-10-CM | POA: Diagnosis not present

## 2016-06-30 DIAGNOSIS — T8611 Kidney transplant rejection: Secondary | ICD-10-CM

## 2016-06-30 DIAGNOSIS — E1159 Type 2 diabetes mellitus with other circulatory complications: Secondary | ICD-10-CM | POA: Diagnosis not present

## 2016-06-30 DIAGNOSIS — I69320 Aphasia following cerebral infarction: Secondary | ICD-10-CM | POA: Diagnosis not present

## 2016-06-30 DIAGNOSIS — R471 Dysarthria and anarthria: Secondary | ICD-10-CM | POA: Diagnosis present

## 2016-06-30 DIAGNOSIS — E860 Dehydration: Secondary | ICD-10-CM | POA: Diagnosis present

## 2016-06-30 DIAGNOSIS — E1151 Type 2 diabetes mellitus with diabetic peripheral angiopathy without gangrene: Secondary | ICD-10-CM | POA: Diagnosis present

## 2016-06-30 DIAGNOSIS — F329 Major depressive disorder, single episode, unspecified: Secondary | ICD-10-CM | POA: Diagnosis present

## 2016-06-30 DIAGNOSIS — I6523 Occlusion and stenosis of bilateral carotid arteries: Secondary | ICD-10-CM | POA: Diagnosis not present

## 2016-06-30 HISTORY — DX: Kidney transplant rejection: T86.11

## 2016-06-30 LAB — COMPREHENSIVE METABOLIC PANEL
ALK PHOS: 89 U/L (ref 38–126)
ALT: 122 U/L — AB (ref 17–63)
AST: 70 U/L — ABNORMAL HIGH (ref 15–41)
Albumin: 3.7 g/dL (ref 3.5–5.0)
Anion gap: 8 (ref 5–15)
BUN: 27 mg/dL — ABNORMAL HIGH (ref 6–20)
CALCIUM: 8.3 mg/dL — AB (ref 8.9–10.3)
CO2: 20 mmol/L — AB (ref 22–32)
CREATININE: 1.35 mg/dL — AB (ref 0.61–1.24)
Chloride: 104 mmol/L (ref 101–111)
GFR calc non Af Amer: 54 mL/min — ABNORMAL LOW (ref >60)
Glucose, Bld: 159 mg/dL — ABNORMAL HIGH (ref 65–99)
Potassium: 3.9 mmol/L (ref 3.5–5.1)
SODIUM: 132 mmol/L — AB (ref 135–145)
TOTAL PROTEIN: 6.9 g/dL (ref 6.5–8.1)
Total Bilirubin: 0.9 mg/dL (ref 0.3–1.2)

## 2016-06-30 LAB — C DIFFICILE QUICK SCREEN W PCR REFLEX
C DIFFICILE (CDIFF) INTERP: NOT DETECTED
C Diff antigen: NEGATIVE
C Diff toxin: NEGATIVE

## 2016-06-30 LAB — BASIC METABOLIC PANEL
Anion gap: 7 (ref 5–15)
BUN: 22 mg/dL — AB (ref 6–20)
CALCIUM: 7.8 mg/dL — AB (ref 8.9–10.3)
CO2: 20 mmol/L — ABNORMAL LOW (ref 22–32)
Chloride: 109 mmol/L (ref 101–111)
Creatinine, Ser: 1.14 mg/dL (ref 0.61–1.24)
Glucose, Bld: 138 mg/dL — ABNORMAL HIGH (ref 65–99)
Potassium: 3.5 mmol/L (ref 3.5–5.1)
SODIUM: 136 mmol/L (ref 135–145)

## 2016-06-30 LAB — CBC
HCT: 34.9 % — ABNORMAL LOW (ref 39.0–52.0)
Hemoglobin: 11.4 g/dL — ABNORMAL LOW (ref 13.0–17.0)
MCH: 29.9 pg (ref 26.0–34.0)
MCHC: 32.7 g/dL (ref 30.0–36.0)
MCV: 91.6 fL (ref 78.0–100.0)
PLATELETS: 129 10*3/uL — AB (ref 150–400)
RBC: 3.81 MIL/uL — AB (ref 4.22–5.81)
RDW: 12.7 % (ref 11.5–15.5)
WBC: 20.7 10*3/uL — AB (ref 4.0–10.5)

## 2016-06-30 LAB — PROCALCITONIN: Procalcitonin: 0.34 ng/mL

## 2016-06-30 LAB — URINALYSIS, ROUTINE W REFLEX MICROSCOPIC
BILIRUBIN URINE: NEGATIVE
Bacteria, UA: NONE SEEN
Glucose, UA: NEGATIVE mg/dL
Ketones, ur: NEGATIVE mg/dL
LEUKOCYTES UA: NEGATIVE
NITRITE: NEGATIVE
PH: 5 (ref 5.0–8.0)
Protein, ur: NEGATIVE mg/dL
SPECIFIC GRAVITY, URINE: 1.019 (ref 1.005–1.030)

## 2016-06-30 LAB — LACTIC ACID, PLASMA
LACTIC ACID, VENOUS: 0.8 mmol/L (ref 0.5–1.9)
Lactic Acid, Venous: 1 mmol/L (ref 0.5–1.9)

## 2016-06-30 LAB — RAPID URINE DRUG SCREEN, HOSP PERFORMED
Amphetamines: NOT DETECTED
BARBITURATES: NOT DETECTED
Benzodiazepines: NOT DETECTED
Cocaine: NOT DETECTED
Opiates: NOT DETECTED
TETRAHYDROCANNABINOL: NOT DETECTED

## 2016-06-30 LAB — ETHANOL

## 2016-06-30 LAB — APTT: aPTT: 29 seconds (ref 24–36)

## 2016-06-30 LAB — GLUCOSE, CAPILLARY
GLUCOSE-CAPILLARY: 243 mg/dL — AB (ref 65–99)
Glucose-Capillary: 132 mg/dL — ABNORMAL HIGH (ref 65–99)
Glucose-Capillary: 127 mg/dL — ABNORMAL HIGH (ref 65–99)
Glucose-Capillary: 225 mg/dL — ABNORMAL HIGH (ref 65–99)
Glucose-Capillary: 181 mg/dL — ABNORMAL HIGH (ref 65–99)

## 2016-06-30 LAB — CREATININE, URINE, RANDOM: Creatinine, Urine: 222.97 mg/dL

## 2016-06-30 LAB — BRAIN NATRIURETIC PEPTIDE: B NATRIURETIC PEPTIDE 5: 185 pg/mL — AB (ref 0.0–100.0)

## 2016-06-30 LAB — PROTIME-INR
INR: 1.25
PROTHROMBIN TIME: 15.8 s — AB (ref 11.4–15.2)

## 2016-06-30 LAB — TROPONIN I

## 2016-06-30 MED ORDER — CARVEDILOL 12.5 MG PO TABS
25.0000 mg | ORAL_TABLET | Freq: Two times a day (BID) | ORAL | Status: DC
  Administered 2016-06-30 – 2016-07-01 (×3): 25 mg via ORAL
  Filled 2016-06-30 (×5): qty 1

## 2016-06-30 MED ORDER — INSULIN DETEMIR 100 UNIT/ML ~~LOC~~ SOLN
20.0000 [IU] | Freq: Every day | SUBCUTANEOUS | Status: DC
  Administered 2016-06-30 – 2016-07-01 (×2): 20 [IU] via SUBCUTANEOUS
  Filled 2016-06-30 (×2): qty 0.2

## 2016-06-30 MED ORDER — ONDANSETRON HCL 4 MG/2ML IJ SOLN: 4.0000 mg | Freq: Four times a day (QID) | INTRAMUSCULAR | Status: DC | PRN

## 2016-06-30 MED ORDER — PNEUMOCOCCAL VAC POLYVALENT 25 MCG/0.5ML IJ INJ
0.5000 mL | INJECTION | INTRAMUSCULAR | Status: AC
  Administered 2016-07-01: 0.5 mL via INTRAMUSCULAR
  Filled 2016-06-30: qty 0.5

## 2016-06-30 MED ORDER — INSULIN ASPART 100 UNIT/ML ~~LOC~~ SOLN
0.0000 [IU] | Freq: Every day | SUBCUTANEOUS | Status: DC
  Administered 2016-06-30 – 2016-07-03 (×2): 2 [IU] via SUBCUTANEOUS

## 2016-06-30 MED ORDER — SODIUM CHLORIDE 0.9 % IV SOLN
INTRAVENOUS | Status: AC
  Administered 2016-07-01: 04:00:00 via INTRAVENOUS

## 2016-06-30 MED ORDER — HYDRALAZINE HCL 20 MG/ML IJ SOLN: 5.0000 mg | INTRAMUSCULAR | Status: DC | PRN

## 2016-06-30 MED ORDER — ATORVASTATIN CALCIUM 40 MG PO TABS
40.0000 mg | ORAL_TABLET | Freq: Every evening | ORAL | Status: DC
  Administered 2016-06-30 – 2016-07-03 (×4): 40 mg via ORAL
  Filled 2016-06-30 (×4): qty 1

## 2016-06-30 MED ORDER — SODIUM CHLORIDE 0.9 % IV BOLUS (SEPSIS)
2000.0000 mL | Freq: Once | INTRAVENOUS | Status: AC
  Administered 2016-06-30: 2000 mL via INTRAVENOUS

## 2016-06-30 MED ORDER — SERTRALINE HCL 100 MG PO TABS
100.0000 mg | ORAL_TABLET | Freq: Every day | ORAL | Status: DC
Start: 2016-06-30 — End: 2016-07-04
  Administered 2016-06-30 – 2016-07-04 (×5): 100 mg via ORAL
  Filled 2016-06-30 (×5): qty 1

## 2016-06-30 MED ORDER — ONDANSETRON HCL 4 MG PO TABS
4.0000 mg | ORAL_TABLET | Freq: Four times a day (QID) | ORAL | Status: DC | PRN
  Filled 2016-06-30: qty 1

## 2016-06-30 MED ORDER — ASPIRIN 81 MG PO CHEW
81.0000 mg | CHEWABLE_TABLET | Freq: Every day | ORAL | Status: DC
  Administered 2016-06-30 – 2016-07-01 (×2): 81 mg via ORAL
  Filled 2016-06-30 (×2): qty 1

## 2016-06-30 MED ORDER — INSULIN ASPART 100 UNIT/ML ~~LOC~~ SOLN
0.0000 [IU] | Freq: Three times a day (TID) | SUBCUTANEOUS | Status: DC
Start: 2016-06-30 — End: 2016-07-04
  Administered 2016-06-30: 3 [IU] via SUBCUTANEOUS
  Administered 2016-06-30: 2 [IU] via SUBCUTANEOUS
  Administered 2016-06-30: 1 [IU] via SUBCUTANEOUS
  Administered 2016-07-01: 5 [IU] via SUBCUTANEOUS
  Administered 2016-07-01: 1 [IU] via SUBCUTANEOUS
  Administered 2016-07-01 – 2016-07-02 (×2): 5 [IU] via SUBCUTANEOUS
  Administered 2016-07-02 – 2016-07-03 (×2): 2 [IU] via SUBCUTANEOUS
  Administered 2016-07-04: 1 [IU] via SUBCUTANEOUS

## 2016-06-30 MED ORDER — ACETAMINOPHEN 325 MG PO TABS: 650.0000 mg | ORAL_TABLET | Freq: Four times a day (QID) | ORAL | Status: DC | PRN

## 2016-06-30 MED ORDER — PIPERACILLIN-TAZOBACTAM 3.375 G IVPB
3.3750 g | Freq: Once | INTRAVENOUS | Status: AC
  Administered 2016-06-30: 3.375 g via INTRAVENOUS
  Filled 2016-06-30 (×2): qty 50

## 2016-06-30 MED ORDER — SODIUM CHLORIDE 0.9 % IV SOLN
INTRAVENOUS | Status: DC
  Administered 2016-06-30: 05:00:00 via INTRAVENOUS

## 2016-06-30 MED ORDER — ACETAMINOPHEN 650 MG RE SUPP: 650.0000 mg | Freq: Four times a day (QID) | RECTAL | Status: DC | PRN

## 2016-06-30 MED ORDER — METRONIDAZOLE 500 MG PO TABS
500.0000 mg | ORAL_TABLET | Freq: Once | ORAL | Status: AC
  Administered 2016-06-30: 500 mg via ORAL
  Filled 2016-06-30: qty 1

## 2016-06-30 MED ORDER — ENOXAPARIN SODIUM 40 MG/0.4ML ~~LOC~~ SOLN
40.0000 mg | SUBCUTANEOUS | Status: DC
  Administered 2016-06-30 – 2016-07-04 (×5): 40 mg via SUBCUTANEOUS
  Filled 2016-06-30 (×5): qty 0.4

## 2016-06-30 MED ORDER — SODIUM CHLORIDE 0.9% FLUSH
3.0000 mL | Freq: Two times a day (BID) | INTRAVENOUS | Status: DC
  Administered 2016-06-30 – 2016-07-04 (×7): 3 mL via INTRAVENOUS

## 2016-06-30 MED ORDER — CLOPIDOGREL BISULFATE 75 MG PO TABS
75.0000 mg | ORAL_TABLET | Freq: Every day | ORAL | Status: DC
  Administered 2016-06-30 – 2016-07-04 (×5): 75 mg via ORAL
  Filled 2016-06-30 (×5): qty 1

## 2016-06-30 NOTE — Progress Notes (Signed)
[facility]  Interval Note   Patient admitted after midnight , for full details see H&P  Patient seen and examined. Report feeling much better, diarrhea has improved, he is alert and oriented x 4 No other complaints   Patient admitted for Acute encephalopathy like 2/2 to dehydration from diarrhea.  C diff negative - Diarrhea likely viral - Abx were given  Sepsis physiology has resolved   Check CBC with diff in AM  Advance diet as tolerated  Patient mentally back to baseline  If continues to improve and WBC down possible d/c tomorrow  Chipper Oman, MD  [facility]

## 2016-06-30 NOTE — NC FL2 (Signed)
[place] LEVEL OF CARE SCREENING TOOL     IDENTIFICATION  Patient Name: [name] Birthdate: 1950/10/24 Sex: male Admission Date (Current Location): 06/29/2016  [facility] and [place] Number:  Herbalist and Address:  [facility],  [place], [place]      Provider Number: [id number]  Attending Physician Name and Address:  Doreatha Lew, MD  Relative Name and Phone Number:       Current Level of Care: Hospital Recommended Level of Care: [place] Prior Approval Number:    Date Approved/Denied:   PASRR Number: [id number]  Discharge Plan: SNF    Current Diagnoses: Patient Active Problem List   Diagnosis Date Noted  . Altered mental status 06/30/2016  . Fall 06/30/2016  . Acute encephalopathy 06/30/2016  . Sepsis ([place]) 06/30/2016  . Diarrhea 06/30/2016  . Acute renal failure superimposed on stage 2 chronic kidney disease ([place]) 06/30/2016  . Hemiparesis affecting right side as late effect of stroke ([place]) 10/23/2015  . PAD (peripheral artery disease) ([place]) 10/20/2011  . Hyperlipidemia 02/21/2010  . CEREBROVASCULAR ACCIDENT, HX OF 12/22/2008  . Diabetes mellitus, type II ([place]) 07/30/2006  . OBESITY, NOS 07/30/2006  . Essential hypertension, benign 07/30/2006    Orientation RESPIRATION BLADDER Height & Weight     Self, Time, Situation, Place  Normal Incontinent Weight: 195 lb 12.3 oz (88.8 kg) Height:  5\' 8"  (172.7 cm)  BEHAVIORAL SYMPTOMS/MOOD NEUROLOGICAL BOWEL NUTRITION STATUS      Continent Diet (Carb Modified)  AMBULATORY STATUS COMMUNICATION OF NEEDS Skin   Extensive Assist Verbally Normal                       Personal Care Assistance Level of Assistance  Bathing, Dressing Bathing Assistance: Limited assistance   Dressing Assistance: Limited assistance     Functional Limitations Info             SPECIAL CARE FACTORS FREQUENCY  PT (By licensed PT), OT (By licensed  OT)     PT Frequency: 5 OT Frequency: 5            Contractures      Additional Factors Info  Code Status, Allergies Code Status Info: Fullcode Allergies Info: NKDA           Current Medications (06/30/2016):  This is the current hospital active medication list Current Facility-Administered Medications  Medication Dose Route Frequency Provider Last Rate Last Dose  . 0.9 %  sodium chloride infusion  100 mL/hr Intravenous Continuous Carmin Muskrat, MD 100 mL/hr at 06/30/16 0252 100 mL/hr at 06/30/16 0252  . 0.9 %  sodium chloride infusion   Intravenous STAT Carmin Muskrat, MD 125 mL/hr at 06/30/16 0500    . acetaminophen (TYLENOL) tablet 650 mg  650 mg Oral Q6H PRN Ivor Costa, MD       Or  . acetaminophen (TYLENOL) suppository 650 mg  650 mg Rectal Q6H PRN Ivor Costa, MD      . aspirin chewable tablet 81 mg  81 mg Oral Daily Ivor Costa, MD   81 mg at 06/30/16 1049  . atorvastatin (LIPITOR) tablet 40 mg  40 mg Oral QPM Ivor Costa, MD      . carvedilol (COREG) tablet 25 mg  25 mg Oral BID WC Ivor Costa, MD   25 mg at 06/30/16 0850  . clopidogrel (PLAVIX) tablet 75 mg  75 mg Oral Q breakfast Ivor Costa, MD  75 mg at 06/30/16 0850  . enoxaparin (LOVENOX) injection 40 mg  40 mg Subcutaneous Q24H Ivor Costa, MD   40 mg at 06/30/16 1049  . hydrALAZINE (APRESOLINE) injection 5 mg  5 mg Intravenous Q2H PRN Ivor Costa, MD      . insulin aspart (novoLOG) injection 0-5 Units  0-5 Units Subcutaneous QHS Ivor Costa, MD      . insulin aspart (novoLOG) injection 0-9 Units  0-9 Units Subcutaneous TID WC Ivor Costa, MD   1 Units at 06/30/16 [phone]  . insulin detemir (LEVEMIR) injection 20 Units  20 Units Subcutaneous Daily Ivor Costa, MD   20 Units at 06/30/16 1049  . ondansetron (ZOFRAN) tablet 4 mg  4 mg Oral Q6H PRN Ivor Costa, MD       Or  . ondansetron [facility]) injection 4 mg  4 mg Intravenous Q6H PRN Ivor Costa, MD      . Derrill Memo ON 07/01/2016] pneumococcal 23 valent vaccine (PNU-IMMUNE) injection 0.5 mL   0.5 mL Intramuscular Tomorrow-1000 Ivor Costa, MD      . sertraline (ZOLOFT) tablet 100 mg  100 mg Oral Daily Ivor Costa, MD   100 mg at 06/30/16 1049  . sodium chloride flush (NS) 0.9 % injection 3 mL  3 mL Intravenous Q12H Ivor Costa, MD   3 mL at 06/30/16 1049     Discharge Medications: Please see discharge summary for a list of discharge medications.  Relevant Imaging Results:  Relevant Lab Results:   Additional Information SSN: [id number]  Standley Brooking, LCSW

## 2016-06-30 NOTE — Care Management Note (Signed)
Case Management Note  Patient Details  Name: [name] MRN: [id number] Date of Birth: [birth year]  Subjective/Objective: 66 y/o m admitted w/Acute encephalopathy. From home alone. PT cons-await recc.                   Action/Plan:d/c plan home.   Expected Discharge Date:                  Expected Discharge Plan:  [place]  In-House Referral:     Discharge planning Services  CM Consult  Post Acute Care Choice:    Choice offered to:     DME Arranged:    DME Agency:     HH Arranged:    [place] Agency:     Status of Service:  In process, will continue to follow  If discussed at Long Length of Stay Meetings, dates discussed:    Additional Comments:  Dessa Phi, RN 06/30/2016, 1:13 PM

## 2016-06-30 NOTE — ED Notes (Addendum)
Dr. Blaine Hamper notified about floor calling and advised that pt vital signs need to be changed to every 4 hr from hourly. Admission orders have not yet been placed by Dr.Niu at this time. Verbal order from Dr.Niu to have In and out cath done to obtain urine sample.

## 2016-06-30 NOTE — H&P (Signed)
History and Physical    [name] [id number] DOB: 25-Oct-1950 DOA: 06/29/2016  Referring MD/NP/PA:   PCP: Lockie Pares, MD   Patient coming from:  The patient is coming from home.  At baseline, pt is partially dependent for most of ADL.   Chief Complaint: Lethargy, fall, diarrhea   HPI: [name] is a 66 y.o. male with medical history significant of hypertension, diabetes mellitus, stroke with right-sided hemiparesis, PVD, DVT, CKD-II, who presents with lethargy, fall and diarrhea.  Per report, pt has been lethargic for 3 days, and had multiple falls. When I saw patient in ED, he is mildly confused, but oriented 3. He answered all questions appropriately. He has right-sided hemiparesis which is likely from previous stroke. Patient reports that he has diarrhea that started yesterday, he had at least 3 bowel movements with watery stool. He denies nausea, vomiting or abdominal pain. No fever or chills. He also denies symptoms of UTI. Patient does not have chest pain, shortness breath, cough, flu symptoms. He denies head injury or neck injury.  ED Course: pt was found to have WBC 23.0, lactate of 2.18, negative urinalysis, INR 1.25, PTT 29, negative troponin, BNP 185.0, worsening renal function, sodium 132, potassium normal, temperature normal. CT head negative for acute intracranial abnormalities. Patient is admitted to telemetry bed as inpatient.  Review of Systems:   General: no fevers, chills, no changes in body weight, has fatigue HEENT: no blurry vision, hearing changes or sore throat Respiratory: no dyspnea, coughing, wheezing CV: no chest pain, no palpitations GI: no nausea, vomiting, abdominal pain, has diarrhea, no constipation GU: no dysuria, burning on urination, increased urinary frequency, hematuria  Ext: no leg edema Neuro: Has a mild confusion, right-sided hemiparesis  Skin: no rash, no skin tear. MSK: No muscle spasm, no deformity, no limitation of range  of movement in spin Heme: No easy bruising.  Travel history: No recent long distant travel.  Allergy: No Known Allergies  Past Medical History:  Diagnosis Date  . 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 CVA (cerebral infarction) 01/03/2008   MRI: Acute 1 x 1.5 cm infarction affecting the left side of the pons.  . Diabetes mellitus   . DVT (deep venous thrombosis) ([place])   . Hypertension   . PAD (peripheral artery disease) ([place])   . Stroke [facility])    2008    Past Surgical History:  Procedure Laterality Date  . None      Social History:  reports that he has never smoked. He has never used smokeless tobacco. He reports that he does not drink alcohol or use drugs.  Family History:  Family History  Problem Relation Age of Onset  . Diabetes Mother   . Heart attack Mother   . Hypertension Mother   . Diabetes Sister   . Hypertension Sister   . Hypertension Brother   . Hypertension Daughter   . Hypertension Son   . Colon cancer Neg Hx   . Esophageal cancer Neg Hx   . Stomach cancer Neg Hx   . Rectal cancer Neg Hx      Prior to Admission medications   Medication Sig Start Date End Date Taking? Authorizing Provider  aspirin 81 MG tablet Take 1 tablet (81 mg total) by mouth daily. Patient taking differently: Take 81 mg by mouth every morning.  06/04/12  Yes Josalyn Funches, MD  atorvastatin (LIPITOR) 40 MG tablet Take 1 tablet (40 mg total) by mouth daily. Patient  taking differently: Take 40 mg by mouth every evening.  08/21/15  Yes Leone Brand, MD  B-D ULTRAFINE III SHORT PEN 31G X 8 MM MISC USE WITH LEVEMIR 08/17/15  Yes Leone Brand, MD  Blood Gluc Meter Disp-Strips [facility] BLOOD GLUCOSE SYSTEM) DEVI Use for daily testing 08/25/13  Yes Hilton Sinclair, MD  carvedilol (COREG) 25 MG tablet TAKE 1 TABLET (25 MG TOTAL) BY MOUTH 2 (TWO) TIMES DAILY WITH A MEAL. 08/21/15  Yes Leone Brand, MD  clopidogrel (PLAVIX) 75 MG tablet TAKE 1 TABLET (75 MG TOTAL) BY MOUTH  DAILY. Patient taking differently: Take 75 mg by mouth every morning.  08/21/15  Yes Leone Brand, MD  hydrochlorothiazide (HYDRODIURIL) 25 MG tablet TAKE 1 TABLET (25 MG TOTAL) BY MOUTH DAILY. Patient taking differently: TAKE 1 TABLET (25 MG TOTAL) BY MOUTH EVERY EVENING 08/17/15  Yes Leone Brand, MD  insulin aspart (NOVOLOG FLEXPEN) 100 UNIT/ML FlexPen Inject 15 Units into the skin 2 (two) times daily with a meal. 09/25/15  Yes Leone Brand, MD  Insulin Syringe-Needle U-100 28G X 1/2" 1 ML MISC 1 each by Does not apply route 3 (three) times daily. 06/29/15  Yes Leone Brand, MD  LEVEMIR FLEXTOUCH 100 UNIT/ML Pen INJECT 30 UNITS INTO THE SKIN DAILY 08/10/15  Yes Leone Brand, MD  lisinopril (PRINIVIL,ZESTRIL) 40 MG tablet TAKE 1 TABLET (40 MG TOTAL) BY MOUTH DAILY. 08/21/15  Yes Leone Brand, MD  sertraline (ZOLOFT) 100 MG tablet TAKE 1 TABLET BY MOUTH EVERY DAY Patient taking differently: TAKE 100 MG BY MOUTH EVERY MORNING 06/17/16  Yes Asiyah Cletis Media, MD  spironolactone (ALDACTONE) 50 MG tablet Take 1 tablet (50 mg total) by mouth daily. Patient taking differently: Take 50 mg by mouth every morning.  11/30/15  Yes Leone Brand, MD  amLODipine (NORVASC) 10 MG tablet TAKE 1 TABLET (10 MG TOTAL) BY MOUTH DAILY. Patient not taking: Reported on 06/30/2016 08/16/15   Leone Brand, MD  baclofen (LIORESAL) 10 MG tablet TAKE 1/2 TABLET BY MOUTH 1 time a DAY AS NEEDED FOR MUSCLE SPASMS Patient not taking: Reported on 06/30/2016 03/17/14   Hilton Sinclair, MD  sildenafil (REVATIO) 20 MG tablet Take 2-3 tablets (40-60 mg total) by mouth daily as needed (intercourse). Patient not taking: Reported on 06/30/2016 11/22/12   Boykin Nearing, MD    Physical Exam: Vitals:   06/29/16 2330 06/30/16 0030 06/30/16 0209 06/30/16 0223  BP: 132/55 (!) 122/48 162/59   Pulse: 62 64 66   Resp: 24 25 25    Temp:    98.5 F (36.9 C)  TempSrc:      SpO2: 99% 99% 100%    General: Not in acute  distress HEENT:       Eyes: PERRL, EOMI, no scleral icterus.       ENT: No discharge from the ears and nose, no pharynx injection, no tonsillar enlargement.        Neck: No JVD, no bruit, no mass felt. Heme: No neck lymph node enlargement. Cardiac: S1/S2, RRR, No murmurs, No gallops or rubs. Respiratory: No rales, wheezing, rhonchi or rubs. GI: Soft, nondistended, nontender, no rebound pain, no organomegaly, BS present. GU: No hematuria Ext: No pitting leg edema bilaterally. 2+DP/PT pulse bilaterally. Musculoskeletal: No joint deformities, No joint redness or warmth, no limitation of ROM in spin. Skin: No rashes.  Neuro: mildly confused, but oriented X3, cranial nerves II-XII grossly intact except for right facial droop.  Has right sided hemiparesis. Psych: Patient is not psychotic, no suicidal or hemocidal ideation.  Labs on Admission: I have personally reviewed following labs and imaging studies  CBC:  Recent Labs Lab 06/29/16 2346  WBC 23.0*  NEUTROABS 18.3*  HGB 12.7*  HCT 37.6*  MCV 91.7  PLT [id number]*   Basic Metabolic Panel:  Recent Labs Lab 06/29/16 2346  NA 132*  K 3.9  CL 104  CO2 20*  GLUCOSE 159*  BUN 27*  CREATININE 1.35*  CALCIUM 8.3*   GFR: CrCl cannot be calculated (Unknown ideal weight.). Liver Function Tests:  Recent Labs Lab 06/29/16 2346  AST 70*  ALT 122*  ALKPHOS 89  BILITOT 0.9  PROT 6.9  ALBUMIN 3.7   No results for input(s): LIPASE, AMYLASE in the last 168 hours. No results for input(s): AMMONIA in the last 168 hours. Coagulation Profile:  Recent Labs Lab 06/29/16 2346  INR 1.25   Cardiac Enzymes:  Recent Labs Lab 06/29/16 2346  TROPONINI <0.03   BNP (last 3 results) No results for input(s): PROBNP in the last 8760 hours. HbA1C: No results for input(s): HGBA1C in the last 72 hours. CBG: No results for input(s): GLUCAP in the last 168 hours. Lipid Profile: No results for input(s): CHOL, HDL, LDLCALC, TRIG, CHOLHDL,  LDLDIRECT in the last 72 hours. Thyroid Function Tests: No results for input(s): TSH, T4TOTAL, FREET4, T3FREE, THYROIDAB in the last 72 hours. Anemia Panel: No results for input(s): VITAMINB12, FOLATE, FERRITIN, TIBC, IRON, RETICCTPCT in the last 72 hours. Urine analysis:    Component Value Date/Time   COLORURINE YELLOW 06/30/2016 0221   APPEARANCEUR CLEAR 06/30/2016 0221   LABSPEC 1.019 06/30/2016 0221   PHURINE 5.0 06/30/2016 0221   GLUCOSEU NEGATIVE 06/30/2016 0221   HGBUR LARGE (A) 06/30/2016 0221   BILIRUBINUR NEGATIVE 06/30/2016 0221   KETONESUR NEGATIVE 06/30/2016 0221   PROTEINUR NEGATIVE 06/30/2016 0221   UROBILINOGEN 1.0 01/09/2008 0010   NITRITE NEGATIVE 06/30/2016 0221   LEUKOCYTESUR NEGATIVE 06/30/2016 0221   Sepsis Labs: @LABRCNTIP (procalcitonin:4,lacticidven:4) )No results found for this or any previous visit (from the past 240 hour(s)).   Radiological Exams on Admission: Dg Chest 2 View  Result Date: 06/30/2016 CLINICAL DATA:  Lethargy 2-3 days and foul-smelling urine. Several recent falls. EXAM: CHEST  2 VIEW COMPARISON:  01/08/2008 FINDINGS: Lungs are hypoinflated without focal airspace consolidation or effusion. Cardiomediastinal silhouette is within normal. There is calcified plaque over the aortic arch. There are degenerative changes of the spine. IMPRESSION: Hypoinflation without acute cardiopulmonary disease. Aortic atherosclerosis. Electronically Signed   By: Marin Olp M.D.   On: 06/30/2016 00:02   Ct Head Wo Contrast  Result Date: 06/30/2016 CLINICAL DATA:  Initial evaluation for acute weakness. Multiple falls. EXAM: CT HEAD WITHOUT CONTRAST TECHNIQUE: Contiguous axial images were obtained from the base of the skull through the vertex without intravenous contrast. COMPARISON:  Prior MRI from 01/03/2008. FINDINGS: Brain: Age appropriate cerebral atrophy with mild chronic small vessel ischemic disease. No acute intracranial hemorrhage. No evidence for acute  large vessel territory infarct. No mass lesion, midline shift or mass effect. No hydrocephalus. Posterior fossa cyst again noted. No other extra-axial fluid collection. Vascular: No hyperdense vessel. Scattered vascular calcifications noted within the carotid siphons. Skull: Scalp soft tissues demonstrate no acute abnormality. Calvarium intact. Sinuses/Orbits: Globes and orbital soft tissues within normal limits. Mild mucosal thickening within the ethmoidal air cells and maxillary sinuses. Paranasal sinuses are otherwise clear. No mastoid effusion. IMPRESSION: 1. No acute intracranial process identified.  2. Mild chronic microvascular ischemic disease. Electronically Signed   By: Jeannine Boga M.D.   On: 06/30/2016 00:39     EKG: Independently reviewed. Sinus rhythm, QTC 401, nonspecific T-wave change, early R-wave progression  Assessment/Plan Principal Problem:   Acute encephalopathy Active Problems:   Diabetes mellitus, type II (HCC)   Essential hypertension, benign   Hemiparesis affecting right side as late effect of stroke ([place])   Altered mental status   Fall   Sepsis ([place])   Diarrhea   Acute renal failure superimposed on stage 2 chronic kidney disease ([place])  Acute encephalopathy: Patient is mildly confused, but oriented 3 currently. Etiology is not clear. CT head is negative for acute intracranial abnormalities. Patient has right sided hemiparesis, which is likely from a previous stroke. Urinalysis negative. Patient does not have a respiratory symptoms. No flulike symptoms. Patient has a diarrhea, and meets criteria for sepsis with leukocytosis and elevated lactate, which is likely from diarrhea.  -will admit to tele bed as inpt -Frequent neuro check -IV fluid -Treat diarrhea and sepsis as below  Fall: Etiology is not clear. Likely multifactorial etiologies, including dehydration, possible orthostatic status, sepsis, worsening renal function and right-sided hemiparesis sequela  from previous stroke. -Treat underlying issues -PT/OT  Diarrhea and sepsis: Etiology for diarrhea is not clear. Likely due to viral enteritis, but need to rule out other possibilities, such as colitis or C. difficile colitis. Patient meet criteria for sepsis with elevated lactate and leukocytosis. Currently hemodynamically stable. -pt received one dose of zosyn  -will give one dose of Flagyl -f/u C diff pcr. -will get Procalcitonin and trend lactic acid levels per sepsis protocol. -IVF: 2.5L of NS bolus in ED, followed by 100 cc/h   DM-II: Last A1c 7.2 on 09/25/15. Patient is taking Levemir at home -will decrease Levemir dose from 30 to 20 daily -SSI  HTN:  -hold HCTZ, lisinopril and spironolactone due to sepsis and worsening renal function -continue coreg -IVF hydralazine when necessary  Hx of stroke with hemiparesis affecting right side as late effect of stroke ([place]) -continue ASA  AKI: Likely due to prerenal secondary to dehydration and continuation of ACEI, diruetics - IVF as above - Check  FeUrea - Follow up renal function by BMP - hold HCTZ, lisinopril and spironolactone    DVT ppx: SQ Lovenox Code Status: Full code Family Communication: None at bed side.   Disposition Plan:  Anticipate discharge back to previous home environment Consults called:  none Admission status: Inpatient/tele    Date of Service 06/30/2016    Ivor Costa [facility] Pager [phone]  If 7PM-7AM, please contact night-coverage [email or web address] Password [facility] 06/30/2016, 3:19 AM

## 2016-06-30 NOTE — Evaluation (Signed)
Occupational Therapy Evaluation Patient Details Name: QUINCY GENNUSO MRN: [id number] DOB: Apr 16, 1951 Today's Date: 06/30/2016    History of Present Illness Pt is a 66 yo male admitted with lethargy, weakness and dehydration from a few days of loose bowel movements.  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     Clinical Impression   Pt seen for the above diagnosis and has the deficits listed below. Pt would benefit from cont OT to increase safety on his feet during adls so he can possibly return home to live alone again.  Pt does not have 24/7 S available therefor would benefit from SNF before returning home.    Follow Up Recommendations  SNF;Supervision/Assistance - 24 hour    Equipment Recommendations  None recommended by OT    Recommendations for Other Services       Precautions / Restrictions Precautions Precautions: Fall Restrictions Weight Bearing Restrictions: No      Mobility Bed Mobility Overal bed mobility: Needs Assistance Bed Mobility: Supine to Sit     Supine to sit: Min guard;HOB elevated (HOB at 40 and used bedrail.)     General bed mobility comments: Bed rails used. PT states he pulls on furniture to get up at home.  Transfers Overall transfer level: Needs assistance Equipment used: Quad cane;2 person hand held assist Transfers: Sit to/from Stand Sit to Stand: Min assist;+2 physical assistance         General transfer comment: Steading assist needed.     Balance Overall balance assessment: Needs assistance Sitting-balance support: Feet supported;Single extremity supported Sitting balance-Leahy Scale: Fair     Standing balance support: Single extremity supported;During functional activity Standing balance-Leahy Scale: Poor Standing balance comment: Pt must have outside support to remain standing safely                            ADL Overall ADL's : Needs assistance/impaired Eating/Feeding: Set up;Sitting    Grooming: Sitting;Minimal assistance Grooming Details (indicate cue type and reason): assist donning deodorant. Upper Body Bathing: Minimal assistance;Sitting   Lower Body Bathing: Moderate assistance;Sit to/from stand   Upper Body Dressing : Minimal assistance;Sitting   Lower Body Dressing: Moderate assistance;Sit to/from stand   Toilet Transfer: Minimal assistance;+2 for safety/equipment;BSC (quad cane)   Toileting- Clothing Manipulation and Hygiene: Minimal assistance;Sit to/from stand;Sitting/lateral lean       Functional mobility during ADLs: Minimal assistance;+2 for safety/equipment;Cueing for sequencing;Cueing for safety (quad cane) General ADL Comments: Pt overall not steady on feet and requires several cues for safety with quad cane when up on his feet.  Pt has difficulty reaching his feet.  Wears slip on bedroom shoes most of the time which may not be the best option given his RLE weakness.     Vision     Perception Perception Perception Tested?: No   Praxis Praxis Praxis tested?: Within functional limits    Pertinent Vitals/Pain Pain Assessment: No/denies pain     Hand Dominance Right (but uses L due to old CVA)   Extremity/Trunk Assessment Upper Extremity Assessment Upper Extremity Assessment: RUE deficits/detail RUE Deficits / Details: Uses as gross assist.  High tone.  Shoulder PROM to 90, painful to fully extend elbow and fingers.   RUE: Unable to fully assess due to pain RUE Coordination: decreased fine motor;decreased gross motor   Lower Extremity Assessment Lower Extremity Assessment: Defer to PT evaluation   Cervical / Trunk Assessment Cervical /  Trunk Assessment: Kyphotic (mildly)   Communication Communication Communication: Other (comment) (dysarthric)   Cognition Arousal/Alertness: Awake/alert Behavior During Therapy: WFL for tasks assessed/performed Overall Cognitive Status: History of cognitive impairments - at baseline                  General Comments: Pt does fairly well cognitively, alert and oriented following all commands.  Pt is slow to process and with decreased problem solving skillls.     General Comments       Exercises       Shoulder Instructions      Home Living Family/patient expects to be discharged to:: Private residence Living Arrangements: Alone Available Help at Discharge: Family;Available PRN/intermittently   Home Access: Level entry     Home Layout: One level     Bathroom Shower/Tub: Tub/shower unit;Curtain Shower/tub characteristics: Architectural technologist: Standard Bathroom Accessibility: Yes How Accessible: Other (comment) (quad cane) Home Equipment: Cane - quad;Bedside commode;Tub bench          Prior Functioning/Environment Level of Independence: Needs assistance  Gait / Transfers Assistance Needed: walks with quad cane without assist on flat surfaces ADL's / Homemaking Assistance Needed: Pt states he bathes and dresses himself, does simple cooking.  Family helps with cleaning and brings groceries.  Uses [employer] for transportation.            OT Problem List: Decreased strength;Decreased range of motion;Decreased activity tolerance;Impaired balance (sitting and/or standing);Impaired vision/perception;Decreased coordination;Decreased cognition;Decreased safety awareness;Decreased knowledge of use of DME or AE;Impaired tone;Obesity;Impaired UE functional use   OT Treatment/Interventions: Self-care/ADL training;DME and/or AE instruction;Therapeutic activities    OT Goals(Current goals can be found in the care plan section) Acute Rehab OT Goals Patient Stated Goal: to get better OT Goal Formulation: With patient Time For Goal Achievement: 07/14/16 Potential to Achieve Goals: Good ADL Goals Pt Will Perform Lower Body Bathing: sit to/from stand;with supervision Pt Will Perform Lower Body Dressing: with supervision;sit to/from stand Pt Will Transfer to Toilet:  ambulating;with min guard assist Pt Will Perform Toileting - Clothing Manipulation and hygiene: with supervision;sitting/lateral leans Pt Will Perform Tub/Shower Transfer: Tub transfer;with supervision;tub bench  OT Frequency: Min 2X/week   Barriers to D/C: Decreased caregiver support  Pt lives alone       Co-evaluation PT/OT/SLP Co-Evaluation/Treatment: Yes Reason for Co-Treatment: Complexity of the patient's impairments (multi-system involvement) PT goals addressed during session: Mobility/safety with mobility OT goals addressed during session: ADL's and self-care      End of Session Equipment Utilized During Treatment: Gait belt;Other (comment) (quad cane) Nurse Communication: Mobility status  Activity Tolerance: Patient limited by fatigue Patient left: in chair;with call bell/phone within reach;with chair alarm set   Time: [phone] OT Time Calculation (min): 27 min Charges:  OT General Charges $OT Visit: 1 Procedure OT Evaluation $OT Eval Moderate Complexity: 1 Procedure G-Codes:    Glenford Peers 07/13/2016, 10:35 AM  [phone]

## 2016-06-30 NOTE — Care Management Note (Signed)
Case Management Note  Patient Details  Name: [name] MRN: [id number] Date of Birth: 1950-06-16  Subjective/Objective:     PT recc SNF-CSW already following.               Action/Plan:d/c plan SNF.   Expected Discharge Date:                  Expected Discharge Plan:  Skilled Nursing Facility  In-House Referral:  Clinical Social Work  Discharge planning Services  CM Consult  Post Acute Care Choice:    Choice offered to:     DME Arranged:    DME Agency:     HH Arranged:    [place] Agency:     Status of Service:  In process, will continue to follow  If discussed at Long Length of Stay Meetings, dates discussed:    Additional Comments:  Dessa Phi, RN 06/30/2016, 1:14 PM

## 2016-06-30 NOTE — Evaluation (Signed)
Physical Therapy Evaluation Patient Details Name: [name] MRN: [id number] DOB: 28-Feb-1951 Today's Date: 06/30/2016   History of Present Illness  Pt is a 66 yo male admitted with lethargy, weakness and dehydration from a few days of loose bowel movements.  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    Clinical Impression  Pt admitted as above and presenting with functional mobility limitations 2* R side residual weakness/tone, ltd endurance and balance deficits.  Pt would benefit from follow up rehab at SNF level to maximize IND and safety prior to return home with very ltd assist.    Follow Up Recommendations SNF    Equipment Recommendations  None recommended by PT    Recommendations for Other Services OT consult     Precautions / Restrictions Precautions Precautions: Fall Restrictions Weight Bearing Restrictions: No      Mobility  Bed Mobility Overal bed mobility: Needs Assistance Bed Mobility: Supine to Sit     Supine to sit: Min guard;HOB elevated     General bed mobility comments: Bed rails used. Pt states he pulls on furniture to get up at home.  Transfers Overall transfer level: Needs assistance Equipment used: Quad cane;2 person hand held assist Transfers: Sit to/from Stand Sit to Stand: Min assist;+2 physical assistance         General transfer comment: Steading assist needed.   Ambulation/Gait Ambulation/Gait assistance: Min assist;+2 physical assistance;+2 safety/equipment Ambulation Distance (Feet): 30 Feet Assistive device: Quad cane Gait Pattern/deviations: Step-to pattern;Decreased step length - right;Decreased stance time - right;Shuffle;Trunk flexed;Antalgic Gait velocity: decr   General Gait Details: Cues for QC placement and advancement of R LE.  Physical assist for balance, wt shift.  Advancement of R LE with hip hike, increased ER and circumduction to compensate for increased tone into extension and lack of knee  flex/ankle DF in swing phase  Stairs            Wheelchair Mobility    Modified Rankin (Stroke Patients Only)       Balance Overall balance assessment: Needs assistance Sitting-balance support: Feet supported;Single extremity supported Sitting balance-Leahy Scale: Fair     Standing balance support: Single extremity supported;During functional activity Standing balance-Leahy Scale: Poor Standing balance comment: Pt must have outside support to remain standing safely                             Pertinent Vitals/Pain Pain Assessment: No/denies pain    Home Living Family/patient expects to be discharged to:: Private residence Living Arrangements: Alone Available Help at Discharge: Family;Available PRN/intermittently Type of Home: House Home Access: Level entry     Home Layout: One level Home Equipment: Cane - quad;Bedside commode;Tub bench      Prior Function Level of Independence: Needs assistance   Gait / Transfers Assistance Needed: walks with quad cane without assist on flat surfaces  ADL's / Homemaking Assistance Needed: Pt states he bathes and dresses himself, does simple cooking.  Family helps with cleaning and brings groceries.  Uses [employer] for transportation.        Hand Dominance   Dominant Hand: Right    Extremity/Trunk Assessment   Upper Extremity Assessment Upper Extremity Assessment: Defer to OT evaluation RUE Deficits / Details: Uses as gross assist.  High tone.  Shoulder PROM to 90, painful to fully extend elbow and fingers.   RUE: Unable to fully assess due to pain RUE Coordination: decreased fine  motor;decreased gross motor    Lower Extremity Assessment Lower Extremity Assessment: Defer to PT evaluation    Cervical / Trunk Assessment Cervical / Trunk Assessment: Kyphotic  Communication   Communication: Other (comment) (dysarthric)  Cognition Arousal/Alertness: Awake/alert Behavior During Therapy: WFL for tasks  assessed/performed Overall Cognitive Status: History of cognitive impairments - at baseline                 General Comments: Pt does fairly well cognitively, alert and oriented following all commands.  Pt is slow to process and with decreased problem solving skillls.      General Comments General comments (skin integrity, edema, etc.): Pt is fall risk    Exercises     Assessment/Plan    PT Assessment Patient needs continued PT services  PT Problem List Decreased strength;Decreased range of motion;Decreased activity tolerance;Decreased balance;Decreased mobility;Decreased cognition;Decreased knowledge of use of DME;Decreased safety awareness;Obesity          PT Treatment Interventions DME instruction;Gait training;Functional mobility training;Therapeutic activities;Therapeutic exercise;Balance training;Cognitive remediation;Patient/family education    PT Goals (Current goals can be found in the Care Plan section)  Acute Rehab PT Goals Patient Stated Goal: to get better PT Goal Formulation: With patient Time For Goal Achievement: 07/12/16 Potential to Achieve Goals: Fair    Frequency Min 3X/week   Barriers to discharge Decreased caregiver support Very ltd assist of family    Co-evaluation PT/OT/SLP Co-Evaluation/Treatment: Yes Reason for Co-Treatment: Complexity of the patient's impairments (multi-system involvement) PT goals addressed during session: Mobility/safety with mobility OT goals addressed during session: ADL's and self-care       End of Session Equipment Utilized During Treatment: Gait belt Activity Tolerance: Patient tolerated treatment well;Patient limited by fatigue Patient left: in chair;with call bell/phone within reach;with chair alarm set Nurse Communication: Mobility status         Time: [id number] PT Time Calculation (min) (ACUTE ONLY): 38 min   Charges:   PT Evaluation $PT Eval Moderate Complexity: 1 Procedure PT Treatments $Gait  Training: 8-22 mins   PT G Codes:        [name] 08-Jul-2016, 12:32 PM

## 2016-06-30 NOTE — ED Notes (Signed)
Ambrose Dona can be reached at [phone] to pick up patient in the morning.

## 2016-06-30 NOTE — Progress Notes (Signed)
Problem: Pain Managment: Goal: General experience of comfort will improve Outcome: Progressing Denies pain.    Problem: Bowel/Gastric: Goal: Will not experience complications related to bowel motility Outcome: Progressing So far today only 1 episode of diarrhea.  Stool is green and watery.  No complaints of abdominal pain.   Continue with plan of care.

## 2016-07-01 ENCOUNTER — Inpatient Hospital Stay (HOSPITAL_COMMUNITY): Payer: Medicare Other

## 2016-07-01 DIAGNOSIS — I639 Cerebral infarction, unspecified: Secondary | ICD-10-CM

## 2016-07-01 LAB — GLUCOSE, CAPILLARY
GLUCOSE-CAPILLARY: 272 mg/dL — AB (ref 65–99)
Glucose-Capillary: 147 mg/dL — ABNORMAL HIGH (ref 65–99)
Glucose-Capillary: 170 mg/dL — ABNORMAL HIGH (ref 65–99)
Glucose-Capillary: 270 mg/dL — ABNORMAL HIGH (ref 65–99)

## 2016-07-01 LAB — GASTROINTESTINAL PANEL BY PCR, STOOL (REPLACES STOOL CULTURE)
ASTROVIRUS: NOT DETECTED
Adenovirus F40/41: NOT DETECTED
CAMPYLOBACTER SPECIES: NOT DETECTED
Cryptosporidium: NOT DETECTED
Cyclospora cayetanensis: NOT DETECTED
Entamoeba histolytica: NOT DETECTED
Enteroaggregative E coli (EAEC): NOT DETECTED
Enteropathogenic E coli (EPEC): NOT DETECTED
Enterotoxigenic E coli (ETEC): NOT DETECTED
Giardia lamblia: NOT DETECTED
NOROVIRUS GI/GII: NOT DETECTED
PLESIMONAS SHIGELLOIDES: NOT DETECTED
Rotavirus A: NOT DETECTED
SAPOVIRUS (I, II, IV, AND V): NOT DETECTED
SHIGA LIKE TOXIN PRODUCING E COLI (STEC): NOT DETECTED
Salmonella species: NOT DETECTED
Shigella/Enteroinvasive E coli (EIEC): NOT DETECTED
Vibrio cholerae: NOT DETECTED
Vibrio species: NOT DETECTED
Yersinia enterocolitica: NOT DETECTED

## 2016-07-01 LAB — CBC WITH DIFFERENTIAL/PLATELET
BASOS ABS: 0 10*3/uL (ref 0.0–0.1)
BASOS PCT: 0 %
EOS PCT: 0 %
Eosinophils Absolute: 0 10*3/uL (ref 0.0–0.7)
HCT: 32.9 % — ABNORMAL LOW (ref 39.0–52.0)
Hemoglobin: 10.7 g/dL — ABNORMAL LOW (ref 13.0–17.0)
LYMPHS PCT: 14 %
Lymphs Abs: 2.2 10*3/uL (ref 0.7–4.0)
MCH: 29.9 pg (ref 26.0–34.0)
MCHC: 32.5 g/dL (ref 30.0–36.0)
MCV: 91.9 fL (ref 78.0–100.0)
Monocytes Absolute: 1.5 10*3/uL — ABNORMAL HIGH (ref 0.1–1.0)
Monocytes Relative: 9 %
NEUTROS ABS: 12 10*3/uL — AB (ref 1.7–7.7)
Neutrophils Relative %: 77 %
PLATELETS: 124 10*3/uL — AB (ref 150–400)
RBC: 3.58 MIL/uL — AB (ref 4.22–5.81)
RDW: 12.6 % (ref 11.5–15.5)
WBC: 15.7 10*3/uL — AB (ref 4.0–10.5)

## 2016-07-01 LAB — URINE CULTURE: Culture: NO GROWTH

## 2016-07-01 LAB — BASIC METABOLIC PANEL
ANION GAP: 6 (ref 5–15)
BUN: 16 mg/dL (ref 6–20)
CO2: 20 mmol/L — ABNORMAL LOW (ref 22–32)
Calcium: 7.8 mg/dL — ABNORMAL LOW (ref 8.9–10.3)
Chloride: 111 mmol/L (ref 101–111)
Creatinine, Ser: 1.03 mg/dL (ref 0.61–1.24)
GFR calc Af Amer: >60 mL/min (ref >60)
Glucose, Bld: 168 mg/dL — ABNORMAL HIGH (ref 65–99)
POTASSIUM: 3.6 mmol/L (ref 3.5–5.1)
SODIUM: 137 mmol/L (ref 135–145)

## 2016-07-01 LAB — UREA NITROGEN, URINE: UREA NITROGEN UR: 1082 mg/dL

## 2016-07-01 MED ORDER — ASPIRIN 325 MG PO TABS
325.0000 mg | ORAL_TABLET | Freq: Once | ORAL | Status: AC
  Administered 2016-07-01: 325 mg via ORAL
  Filled 2016-07-01: qty 1

## 2016-07-01 MED ORDER — LISINOPRIL 20 MG PO TABS
40.0000 mg | ORAL_TABLET | Freq: Every day | ORAL | Status: DC
Start: 2016-07-01 — End: 2016-07-02
  Administered 2016-07-01: 40 mg via ORAL
  Filled 2016-07-01 (×2): qty 2

## 2016-07-01 MED ORDER — INSULIN DETEMIR 100 UNIT/ML ~~LOC~~ SOLN
30.0000 [IU] | Freq: Every day | SUBCUTANEOUS | Status: DC
  Administered 2016-07-02 – 2016-07-04 (×3): 30 [IU] via SUBCUTANEOUS
  Filled 2016-07-01 (×3): qty 0.3

## 2016-07-01 NOTE — Progress Notes (Signed)
PROGRESS NOTE [facility]   [name]   [id number] DOB: 1950/12/19  DOA: 06/29/2016 PCP: Lockie Pares, MD   Brief Narrative:  [name] is a 66 y.o. male with medical history significant of hypertension, diabetes mellitus, stroke with right-sided hemiparesis, PVD, DVT, CKD-II, who presents with lethargy, fall and diarrhea. Admitted to r/o c diff ? Sepsis and acute encephalopathy. Patient was treated with IV abx, CT head was done which was negative. C. Diff was negative. PT evaluation recommended patient to go to SNF. Daughter patients report increase in dysarthria, stating that is not his baseline. MRI and SLP was ordered.   Subjective: Patient seen and examined at bedside. Doing well have no complaints. Diarrhea has stopped. Denies abdominal pain and nausea. No new concerns. Patient remains afebrile.   Assessment & Plan: Acute metabolic encephalopathy 2/2 to dehydration due viral gastroenteritis need to r/o new stroke given increase in dysarthria.  C diff negative GI panel pending  Continue conservative treatment  Will get MRI brain - check for new stoke  Patient back mentally at baseline   Fall - multifactorial viral gastroenteritis, dehydration, possible deconditioning from old stroke.  R/o new stoke PT recommending SNF   Dysarthria - per daughter not at baseline  SLP  MRI brain   DM II - slight above goal  Resume home dose Levamir  SSI  Monitor CBG's   HTN - slight above goal, possible due to IVF  D/c IVF  Monitor BP  Continue Coreg, will resume lisinopril   AKI - prerenal from dehydration - Cr on admission 1.35 - Resolved with IVF  Monitor Cr   DVT prophylaxis: Lovenox Code Status: FULL  Family Communication: Case discussed with daughter over the phone  Disposition Plan: SNF when bed available = 3 midnight rule    Consultants:   None   Procedures:   None   Antimicrobials:  Flagyl and Zosyn 06/29/16 OTO   Objective: Vitals:     06/30/16 1348 06/30/16 2226 07/01/16 0459 07/01/16 0804  BP: (!) 123/44 (!) 159/57 (!) 161/56 (!) 158/39  Pulse: 73 67 63 63  Resp: 18 18 18 16   Temp: 98.3 F (36.8 C) 98.8 F (37.1 C) 98.8 F (37.1 C)   TempSrc: Oral Oral Oral   SpO2: 97% 100% 99% 99%  Weight:      Height:        Intake/Output Summary (Last 24 hours) at 07/01/16 1239 Last data filed at 07/01/16 1049  Gross per 24 hour  Intake          3044.25 ml  Output              575 ml  Net          2469.25 ml   Filed Weights   06/30/16 0500  Weight: 88.8 kg (195 lb 12.3 oz)    Examination:  General exam: Appears calm and comfortable  Respiratory system: Clear to auscultation. No wheezes,crackle or rhonchi Cardiovascular system: S1 & S2 heard, RRR. No JVD, murmurs, rubs or gallops Gastrointestinal system: Abdomen is nondistended, soft and nontender. Normal bowel sounds heard. Central nervous system: Alert and oriented. Dysarthria  Extremities: R side hemiparesis  Psychiatry: Judgement and insight appear normal. Mood & affect appropriate.    Data Reviewed: I have personally reviewed following labs and imaging studies  CBC:  Recent Labs Lab 06/29/16 2346 06/30/16 0507 07/01/16 0522  WBC 23.0* 20.7* 15.7*  NEUTROABS 18.3*  --  12.0*  HGB 12.7*  11.4* 10.7*  HCT 37.6* 34.9* 32.9*  MCV 91.7 91.6 91.9  PLT 142* 129* [id number]*   Basic Metabolic Panel:  Recent Labs Lab 06/29/16 2346 06/30/16 0507 07/01/16 0522  NA 132* 136 137  K 3.9 3.5 3.6  CL 104 109 111  CO2 20* 20* 20*  GLUCOSE 159* 138* 168*  BUN 27* 22* 16  CREATININE 1.35* 1.14 1.03  CALCIUM 8.3* 7.8* 7.8*   GFR: Estimated Creatinine Clearance: 77.5 mL/min (by C-G formula based on SCr of 1.03 mg/dL). Liver Function Tests:  Recent Labs Lab 06/29/16 2346  AST 70*  ALT 122*  ALKPHOS 89  BILITOT 0.9  PROT 6.9  ALBUMIN 3.7   No results for input(s): LIPASE, AMYLASE in the last 168 hours. No results for input(s): AMMONIA in the last 168  hours. Coagulation Profile:  Recent Labs Lab 06/29/16 2346  INR 1.25   Cardiac Enzymes:  Recent Labs Lab 06/29/16 2346  TROPONINI <0.03   BNP (last 3 results) No results for input(s): PROBNP in the last 8760 hours. HbA1C: No results for input(s): HGBA1C in the last 72 hours. CBG:  Recent Labs Lab 06/30/16 1338 06/30/16 1708 06/30/16 2233 07/01/16 0739 07/01/16 1145  GLUCAP 225* 181* 243* 147* 272*   Lipid Profile: No results for input(s): CHOL, HDL, LDLCALC, TRIG, CHOLHDL, LDLDIRECT in the last 72 hours. Thyroid Function Tests: No results for input(s): TSH, T4TOTAL, FREET4, T3FREE, THYROIDAB in the last 72 hours. Anemia Panel: No results for input(s): VITAMINB12, FOLATE, FERRITIN, TIBC, IRON, RETICCTPCT in the last 72 hours. Sepsis Labs:  Recent Labs Lab 06/29/16 2346 06/30/16 0250 06/30/16 0507  PROCALCITON  --  0.34  --   LATICACIDVEN 2.18* 1.0 0.8    Recent Results (from the past 240 hour(s))  Urine culture     Status: None   Collection Time: 06/30/16  2:19 AM  Result Value Ref Range Status   Specimen Description URINE, RANDOM  Final   Special Requests NONE  Final   Culture   Final    NO GROWTH Performed at [facility], [address] [place], [place] [address]    Report Status 07/01/2016 FINAL  Final  C difficile quick scan w PCR reflex     Status: None   Collection Time: 06/30/16  7:02 AM  Result Value Ref Range Status   C Diff antigen NEGATIVE NEGATIVE Final   C Diff toxin NEGATIVE NEGATIVE Final   C Diff interpretation No C. difficile detected.  Final    Radiology Studies: Dg Chest 2 View  Result Date: 06/30/2016 CLINICAL DATA:  Lethargy 2-3 days and foul-smelling urine. Several recent falls. EXAM: CHEST  2 VIEW COMPARISON:  01/08/2008 FINDINGS: Lungs are hypoinflated without focal airspace consolidation or effusion. Cardiomediastinal silhouette is within normal. There is calcified plaque over the aortic arch. There are degenerative  changes of the spine. IMPRESSION: Hypoinflation without acute cardiopulmonary disease. Aortic atherosclerosis. Electronically Signed   By: Marin Olp M.D.   On: 06/30/2016 00:02   Ct Head Wo Contrast  Result Date: 06/30/2016 CLINICAL DATA:  Initial evaluation for acute weakness. Multiple falls. EXAM: CT HEAD WITHOUT CONTRAST TECHNIQUE: Contiguous axial images were obtained from the base of the skull through the vertex without intravenous contrast. COMPARISON:  Prior MRI from 01/03/2008. FINDINGS: Brain: Age appropriate cerebral atrophy with mild chronic small vessel ischemic disease. No acute intracranial hemorrhage. No evidence for acute large vessel territory infarct. No mass lesion, midline shift or mass effect. No hydrocephalus. Posterior fossa  cyst again noted. No other extra-axial fluid collection. Vascular: No hyperdense vessel. Scattered vascular calcifications noted within the carotid siphons. Skull: Scalp soft tissues demonstrate no acute abnormality. Calvarium intact. Sinuses/Orbits: Globes and orbital soft tissues within normal limits. Mild mucosal thickening within the ethmoidal air cells and maxillary sinuses. Paranasal sinuses are otherwise clear. No mastoid effusion. IMPRESSION: 1. No acute intracranial process identified. 2. Mild chronic microvascular ischemic disease. Electronically Signed   By: Jeannine Boga M.D.   On: 06/30/2016 00:39     Scheduled Meds: . aspirin  81 mg Oral Daily  . atorvastatin  40 mg Oral QPM  . carvedilol  25 mg Oral BID WC  . clopidogrel  75 mg Oral Q breakfast  . enoxaparin (LOVENOX) injection  40 mg Subcutaneous Q24H  . insulin aspart  0-5 Units Subcutaneous QHS  . insulin aspart  0-9 Units Subcutaneous TID WC  . insulin detemir  20 Units Subcutaneous Daily  . sertraline  100 mg Oral Daily  . sodium chloride flush  3 mL Intravenous Q12H   Continuous Infusions:   LOS: 1 day    Chipper Oman, MD [facility] Pager [phone]  If  7PM-7AM, please contact night-coverage [email or web address] Password [facility] 07/01/2016, 12:39 PM

## 2016-07-01 NOTE — Progress Notes (Signed)
Pt has bed over at [facility]. Report called to nurse. [facility] called for transport. Daughter called for update. VSS, pt not in any distress. [facility] transported pt off unit and asked to update daughter upon arrival to unit.

## 2016-07-01 NOTE — Progress Notes (Signed)
Pt dtr, [name], at bedside.  She states pt's dysarthria is not baseline.  States since his stroke in 2009 he has occasional slurring when he is tired but it's not consistent.  Dr Quincy Simmonds made aware, states he will order an MRI.

## 2016-07-01 NOTE — Progress Notes (Signed)
Interval note   MRI positive for acute/subacute stroke   1. Diffusion restriction within the left aspect of corpus callosum extending into left cingulate gyrus in paramedian frontal lobe compatible with acute/ early subacute infarction, left ACA distribution. No acute hemorrhage identified. 2. Mild chronic microvascular ischemic changes and moderate diffuse brain parenchymal volume loss. Chronic left pons infarct. 3. Mild diffuse paranasal sinus disease.  Patient report mild increase in weakness at the R side, more difficulty with speech.  No other complaints   Exam: R side hemiparesis, dysarthria, Visual fields intact. AAOx3   Impression: Acute stroke in left ACA distribution   Plan:  Transfer to [facility]  Neurology consult  Will give ASA 325mg  now  Continue Plavix Neuro checks  ECHO SLP, OT/PT  Allow permissive HTN   Called daughter and gave her an update.   Chipper Oman, MD

## 2016-07-01 NOTE — Progress Notes (Signed)
Dr Quincy Simmonds aware of BP 180/73, HR 55.  States to hold Coreg and he will place parameters on hydralazine.

## 2016-07-02 ENCOUNTER — Inpatient Hospital Stay (HOSPITAL_COMMUNITY): Payer: Medicare Other

## 2016-07-02 DIAGNOSIS — I6789 Other cerebrovascular disease: Secondary | ICD-10-CM

## 2016-07-02 DIAGNOSIS — E118 Type 2 diabetes mellitus with unspecified complications: Secondary | ICD-10-CM

## 2016-07-02 DIAGNOSIS — I639 Cerebral infarction, unspecified: Secondary | ICD-10-CM

## 2016-07-02 LAB — GLUCOSE, CAPILLARY
GLUCOSE-CAPILLARY: 256 mg/dL — AB (ref 65–99)
GLUCOSE-CAPILLARY: 144 mg/dL — AB (ref 65–99)
Glucose-Capillary: 123 mg/dL — ABNORMAL HIGH (ref 65–99)
Glucose-Capillary: 177 mg/dL — ABNORMAL HIGH (ref 65–99)

## 2016-07-02 LAB — ECHOCARDIOGRAM COMPLETE
HEIGHTINCHES: 67 in
Weight: 3920 oz

## 2016-07-02 MED ORDER — PERFLUTREN LIPID MICROSPHERE
1.0000 mL | INTRAVENOUS | Status: AC | PRN
  Administered 2016-07-02: 2 mL via INTRAVENOUS
  Filled 2016-07-02: qty 10

## 2016-07-02 MED ORDER — ASPIRIN 325 MG PO TABS
325.0000 mg | ORAL_TABLET | Freq: Every day | ORAL | Status: DC
  Administered 2016-07-02 – 2016-07-04 (×3): 325 mg via ORAL
  Filled 2016-07-02 (×3): qty 1

## 2016-07-02 MED ORDER — HYDRALAZINE HCL 20 MG/ML IJ SOLN: 5.0000 mg | INTRAMUSCULAR | Status: DC | PRN

## 2016-07-02 MED ORDER — IOPAMIDOL (ISOVUE-370) INJECTION 76%
INTRAVENOUS | Status: AC
  Filled 2016-07-02: qty 50

## 2016-07-02 MED ORDER — IOPAMIDOL (ISOVUE-370) INJECTION 76%
INTRAVENOUS | Status: AC
  Administered 2016-07-02: 50 mL
  Filled 2016-07-02: qty 50

## 2016-07-02 NOTE — Progress Notes (Signed)
  Echocardiogram 2D Echocardiogram with Definity has been performed.  Ermal Brzozowski 07/02/2016, 1:05 PM

## 2016-07-02 NOTE — Progress Notes (Signed)
PROGRESS NOTE    [name]  [id number] DOB: 04/07/1951 DOA: 06/29/2016 PCP: Lockie Pares, MD    Brief Narrative:  66 yo male with t2dm, htn and cva with right hemiparesis, presents with lethargy, slurred speech and diarrhea. Initial gi workup has been negative. Brain MRI positive for acute left ACA distribution CVA. Transferred from [facility] for further neurology evalaution.    Assessment & Plan:   Principal Problem:   Acute encephalopathy Active Problems:   Diabetes mellitus, type II ([place])   Essential hypertension, benign   Hemiparesis affecting right side as late effect of stroke ([facility])   Altered mental status   Fall   Sepsis ([employer])   Diarrhea   Acute renal failure superimposed on stage 2 chronic kidney disease ([place])   Cerebrovascular accident (CVA) ([place])   1. Acute left ACA distribution CVA. Improved speech, patient has chronic left hemiparesis, will continue statin therapy and dual antiplatelet therapy. Continue blood pressure control, follow with physical therapy and neurology recommendations. Follow on echocardiogram and carotid US.   2. T2DM. Capillary glucose [id number]. Will continue coverage and monitoring with insulin sliding scale. And levimir 30 units daily.   3, HTN. Continue as needed IV hydralazine, systolic 0000000 to [id number], patient at home on coreg 25 mg bid, hctz 25 mg, lisinopril 40 mg, amlodipine 10 mg, aldactone 50 mg. Will allow permissive htn for the next 48 hours and then resume slowly back on antihypertensive therapy.   4, AKI. Renal function with cr down to 1.0 from 1,35, will continue to hold on nephrotoxic medications, avoid hypotension, follow renal function in am.   5. Depression. Continue on sertraline.    DVT prophylaxis: enoxaparin  Code Status: full  Family Communication: no family at the bedside  Disposition Plan: snf   Consultants:   Neurology   Procedures:    Antimicrobials:   Subjective: Unable to mover his left side, with  no nausea or vomiting, no dyspnea or chest pain. Speech has improved.  Objective: Vitals:   07/01/16 2131 07/01/16 2222 07/02/16 0100 07/02/16 0531  BP: (!) 164/58 (!) 172/48 (!) 169/52 (!) 156/54  Pulse: (!) 58 (!) 56 60 (!) 59  Resp: 16 20 20 20   Temp: 97.9 F (36.6 C) 99 F (37.2 C) 98.9 F (37.2 C) 99.2 F (37.3 C)  TempSrc: Oral Oral Oral Oral  SpO2: 100% 100% 100% 100%  Weight:  111.1 kg (245 lb)    Height:  5\' 7"  (1.702 m)      Intake/Output Summary (Last 24 hours) at 07/02/16 0936 Last data filed at 07/02/16 0200  Gross per 24 hour  Intake              843 ml  Output              500 ml  Net              343 ml   Filed Weights   06/30/16 0500 07/01/16 2222  Weight: 88.8 kg (195 lb 12.3 oz) 111.1 kg (245 lb)    Examination:  General exam: not in pain or dyspnea E ENT. Head normocepahlic. Nose and eras no deformities.  Respiratory system: Clear to auscultation. Respiratory effort normal. No wheezing, rales or rhonchi.  Cardiovascular system: S1 & S2 heard, RRR. No JVD, murmurs, rubs, gallops or clicks. No pedal edema. Gastrointestinal system: Abdomen is nondistended, soft and nontender. No organomegaly or masses felt. Normal bowel sounds heard. Central nervous system: right facial droop, right  hemiparesis. Left strength is preserved. Able to respond to questions.  Extremities: Symmetric 5 x 5 power. Skin: No rashes, lesions or ulcers   Data Reviewed: I have personally reviewed following labs and imaging studies  CBC:  Recent Labs Lab 06/29/16 2346 06/30/16 0507 07/01/16 0522  WBC 23.0* 20.7* 15.7*  NEUTROABS 18.3*  --  12.0*  HGB 12.7* 11.4* 10.7*  HCT 37.6* 34.9* 32.9*  MCV 91.7 91.6 91.9  PLT 142* 129* [id number]*   Basic Metabolic Panel:  Recent Labs Lab 06/29/16 2346 06/30/16 0507 07/01/16 0522  NA 132* 136 137  K 3.9 3.5 3.6  CL 104 109 111  CO2 20* 20* 20*  GLUCOSE 159* 138* 168*  BUN 27* 22* 16  CREATININE 1.35* 1.14 1.03  CALCIUM 8.3*  7.8* 7.8*   GFR: Estimated Creatinine Clearance: 85.1 mL/min (by C-G formula based on SCr of 1.03 mg/dL). Liver Function Tests:  Recent Labs Lab 06/29/16 2346  AST 70*  ALT 122*  ALKPHOS 89  BILITOT 0.9  PROT 6.9  ALBUMIN 3.7   No results for input(s): LIPASE, AMYLASE in the last 168 hours. No results for input(s): AMMONIA in the last 168 hours. Coagulation Profile:  Recent Labs Lab 06/29/16 2346  INR 1.25   Cardiac Enzymes:  Recent Labs Lab 06/29/16 2346  TROPONINI <0.03   BNP (last 3 results) No results for input(s): PROBNP in the last 8760 hours. HbA1C: No results for input(s): HGBA1C in the last 72 hours. CBG:  Recent Labs Lab 07/01/16 0739 07/01/16 1145 07/01/16 1640 07/01/16 2349 07/02/16 0631  GLUCAP 147* 272* 270* 170* 123*   Lipid Profile: No results for input(s): CHOL, HDL, LDLCALC, TRIG, CHOLHDL, LDLDIRECT in the last 72 hours. Thyroid Function Tests: No results for input(s): TSH, T4TOTAL, FREET4, T3FREE, THYROIDAB in the last 72 hours. Anemia Panel: No results for input(s): VITAMINB12, FOLATE, FERRITIN, TIBC, IRON, RETICCTPCT in the last 72 hours. Sepsis Labs:  Recent Labs Lab 06/29/16 2346 06/30/16 0250 06/30/16 0507  PROCALCITON  --  0.34  --   LATICACIDVEN 2.18* 1.0 0.8    Recent Results (from the past 240 hour(s))  Culture, blood (x 2)     Status: None (Preliminary result)   Collection Time: 06/29/16 11:30 PM  Result Value Ref Range Status   Specimen Description BLOOD RIGHT HAND  Final   Special Requests IN PEDIATRIC BOTTLE 3CC  Final   Culture   Final    NO GROWTH 1 DAY Performed at [facility], [place] [address], [place], [place] [address]    Report Status PENDING  Incomplete  Culture, blood (x 2)     Status: None (Preliminary result)   Collection Time: 06/29/16 11:35 PM  Result Value Ref Range Status   Specimen Description BLOOD RIGHT ANTECUBITAL  Final   Special Requests BOTTLES DRAWN AEROBIC AND ANAEROBIC 5CC EACH   Final   Culture   Final    NO GROWTH 1 DAY Performed at [facility], [place] [address], [place], [place] [address]    Report Status PENDING  Incomplete  Urine culture     Status: None   Collection Time: 06/30/16  2:19 AM  Result Value Ref Range Status   Specimen Description URINE, RANDOM  Final   Special Requests NONE  Final   Culture   Final    NO GROWTH Performed at [facility], [address] [place], [place] [address]    Report Status 07/01/2016 FINAL  Final  C  difficile quick scan w PCR reflex     Status: None   Collection Time: 06/30/16  7:02 AM  Result Value Ref Range Status   C Diff antigen NEGATIVE NEGATIVE Final   C Diff toxin NEGATIVE NEGATIVE Final   C Diff interpretation No C. difficile detected.  Final  Gastrointestinal Panel by PCR , Stool     Status: None   Collection Time: 06/30/16  7:02 AM  Result Value Ref Range Status   Campylobacter species NOT DETECTED NOT DETECTED Final   Plesimonas shigelloides NOT DETECTED NOT DETECTED Final   Salmonella species NOT DETECTED NOT DETECTED Final   Yersinia enterocolitica NOT DETECTED NOT DETECTED Final   Vibrio species NOT DETECTED NOT DETECTED Final   Vibrio cholerae NOT DETECTED NOT DETECTED Final   Enteroaggregative E coli (EAEC) NOT DETECTED NOT DETECTED Final   Enteropathogenic E coli (EPEC) NOT DETECTED NOT DETECTED Final   Enterotoxigenic E coli (ETEC) NOT DETECTED NOT DETECTED Final   Shiga like toxin producing E coli (STEC) NOT DETECTED NOT DETECTED Final   Shigella/Enteroinvasive E coli (EIEC) NOT DETECTED NOT DETECTED Final   Cryptosporidium NOT DETECTED NOT DETECTED Final   Cyclospora cayetanensis NOT DETECTED NOT DETECTED Final   Entamoeba histolytica NOT DETECTED NOT DETECTED Final   Giardia lamblia NOT DETECTED NOT DETECTED Final   Adenovirus F40/41 NOT DETECTED NOT DETECTED Final   Astrovirus NOT DETECTED NOT DETECTED Final   Norovirus GI/GII NOT DETECTED NOT DETECTED Final   Rotavirus A  NOT DETECTED NOT DETECTED Final   Sapovirus (I, II, IV, and V) NOT DETECTED NOT DETECTED Final         Radiology Studies: Mr Brain 2 Contrast  Result Date: 07/01/2016 CLINICAL DATA:  66 y/o M; acute weakness, altered mental status, and multiple falls with concern for acute stroke. EXAM: MRI HEAD WITHOUT CONTRAST TECHNIQUE: Multiplanar, multiecho pulse sequences of the brain and surrounding structures were obtained without intravenous contrast. COMPARISON:  06/29/2016 CT head.  01/03/2008 MRI head. FINDINGS: Brain: Diffusion restriction within the left aspect of corpus callosum extending into left cingulate gyrus in paramedian frontal lobe compatible with acute/ early subacute infarction. 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 Chronic infarct within the left hemi pons. Moderate diffuse supratentorial and infratentorial brain parenchymal volume loss. Background of mild chronic microvascular ischemic changes in white matter. No hydrocephalus. No extra-axial collection. No abnormal susceptibility hypointensity to indicate intracranial hemorrhage. Vascular: Normal flow voids. Skull and upper cervical spine: Normal marrow signal. Sinuses/Orbits: Mild diffuse paranasal sinus mucosal thickening greatest in the maxillary sinuses. No abnormal signal of the mastoid air cells. Orbits are unremarkable. Other: None. IMPRESSION: 1. Diffusion restriction within the left aspect of corpus callosum extending into left cingulate gyrus in paramedian frontal lobe compatible with acute/ early subacute infarction, left ACA distribution. No acute hemorrhage identified. 2. Mild chronic microvascular ischemic changes and moderate diffuse brain parenchymal volume loss. Chronic left pons infarct. 3. Mild diffuse paranasal sinus disease. These results will be called to the ordering clinician or representative by  the Radiologist Assistant, and communication documented in the PACS or zVision Dashboard. Electronically Signed   By: Kristine Garbe M.D.   On: 07/01/2016 14:57        Scheduled Meds: . aspirin  325 mg Oral Daily  . atorvastatin  40 mg Oral QPM  . clopidogrel  75 mg Oral Q breakfast  . enoxaparin (LOVENOX) injection  40 mg Subcutaneous Q24H  . insulin aspart  0-5 Units Subcutaneous QHS  . insulin aspart  0-9 Units Subcutaneous TID WC  . insulin detemir  30 Units Subcutaneous Daily  . sertraline  100 mg Oral Daily  . sodium chloride flush  3 mL Intravenous Q12H   Continuous Infusions:   LOS: 2 days       Tashawna Thom Gerome Apley, MD [facility] Pager [phone]  If 7PM-7AM, please contact night-coverage [email or web address] Password [email or web address] 07/02/2016, 9:36 AM

## 2016-07-02 NOTE — Clinical Social Work Placement (Signed)
CSW spoke with patient's daughter, Janace Hoard re: discharge planning - daughter states that she feels he would need to go to rehab and would like to be present in the room when bed offers are given.    Raynaldo Opitz, [facility] Clinical Social Worker cell #: [phone]    CLINICAL SOCIAL WORK PLACEMENT  NOTE  Date:  07/02/2016  Patient Details  Name: [name] MRN: [id number] Date of Birth: [birth year]  Clinical Social Work is seeking post-discharge placement for this patient at the [place] level of care (*CSW will initial, date and re-position this form in  chart as items are completed):  Yes   Patient/family provided with [place] [facility] Work Department's list of facilities offering this level of care within the geographic area requested by the patient (or if unable, by the patient's family).  Yes   Patient/family informed of their freedom to choose among providers that offer the needed level of care, that participate in Medicare, Medicaid or managed care program needed by the patient, have an available bed and are willing to accept the patient.  Yes   Patient/family informed of [facility]'s ownership interest in [facility] and [facility], as well as of the fact that they are under no obligation to receive care at these facilities.  PASRR submitted to EDS on 07/02/16     PASRR number received on 07/02/16     Existing PASRR number confirmed on       FL2 transmitted to all facilities in geographic area requested by pt/family on 07/02/16     FL2 transmitted to all facilities within larger geographic area on       Patient informed that his/her managed care company has contracts with or will negotiate with certain facilities, including the following:            Patient/family informed of bed offers received.  Patient chooses bed at       Physician recommends and patient chooses bed at      Patient to be  transferred to   on  .  Patient to be transferred to facility by       Patient family notified on   of transfer.  Name of family member notified:        PHYSICIAN       Additional Comment:    _______________________________________________ Standley Brooking, LCSW 07/02/2016, 9:24 AM

## 2016-07-02 NOTE — Consult Note (Signed)
Requesting Physician: Dr. Cathlean Sauer    Chief Complaint: stokr  History obtained from:   Chart     HPI:                                                                                                                                         [name] is an 66 y.o. male  been lethargic for 3 days, and had multiple falls. When I saw patient in ED, he is mildly confused, but oriented 3. He answered all questions appropriately. He has right-sided hemiparesis which is likely from previous stroke. Patient reports that he has diarrhea that started yesterday, he had at least 3 bowel movements with watery stool. WBC in ED was elevated at 23, INR 1.25, Na 132 and CT head negative. Admitted for acute encephalopathy/sepsis.  MRI was obtained showing acute CVA.   Takes ASA by does not take every day due forgetting to get it.   Date last known well: Unable to determine Time last known well: Unable to determine tPA Given: No: out of window   Past Medical History:  Diagnosis Date  . 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 CVA (cerebral infarction) 01/03/2008   MRI: Acute 1 x 1.5 cm infarction affecting the left side of the pons.  . Diabetes mellitus   . DVT (deep venous thrombosis) ([place])   . Hypertension   . PAD (peripheral artery disease) ([place])   . Stroke [facility])    2008    Past Surgical History:  Procedure Laterality Date  . None      Family History  Problem Relation Age of Onset  . Diabetes Mother   . Heart attack Mother   . Hypertension Mother   . Diabetes Sister   . Hypertension Sister   . Hypertension Brother   . Hypertension Daughter   . Hypertension Son   . Colon cancer Neg Hx   . Esophageal cancer Neg Hx   . Stomach cancer Neg Hx   . Rectal cancer Neg Hx    Social History:  reports that he has never smoked. He has never used smokeless tobacco. He reports that he does not drink alcohol or use drugs.  Allergies: No Known Allergies  Medications:  Prior to Admission:  Facility-Administered Medications Prior to Admission  Medication Dose Route Frequency Provider Last Rate Last Dose  . 0.9 %  sodium chloride infusion  500 mL Intravenous Continuous Gatha Mayer, MD       Prescriptions Prior to Admission  Medication Sig Dispense Refill Last Dose  . aspirin 81 MG tablet Take 1 tablet (81 mg total) by mouth daily. (Patient taking differently: Take 81 mg by mouth every morning. ) 30 tablet 11 Past Month at Unknown time  . atorvastatin (LIPITOR) 40 MG tablet Take 1 tablet (40 mg total) by mouth daily. (Patient taking differently: Take 40 mg by mouth every evening. ) 90 tablet 3 06/29/2016 at Unknown time  . B-D ULTRAFINE III SHORT PEN 31G X 8 MM MISC USE WITH LEVEMIR 100 each 0 Taking  . Blood Gluc Meter Disp-Strips (SIDEKICK BLOOD GLUCOSE SYSTEM) DEVI Use for daily testing 100 each 3 Taking  . carvedilol (COREG) 25 MG tablet TAKE 1 TABLET (25 MG TOTAL) BY MOUTH 2 (TWO) TIMES DAILY WITH A MEAL. 180 tablet 3 06/29/2016 at Unknown time  . clopidogrel (PLAVIX) 75 MG tablet TAKE 1 TABLET (75 MG TOTAL) BY MOUTH DAILY. (Patient taking differently: Take 75 mg by mouth every morning. ) 90 tablet 3 06/29/2016 at Unknown time  . hydrochlorothiazide (HYDRODIURIL) 25 MG tablet TAKE 1 TABLET (25 MG TOTAL) BY MOUTH DAILY. (Patient taking differently: TAKE 1 TABLET (25 MG TOTAL) BY MOUTH EVERY EVENING) 90 tablet 2 06/29/2016 at Unknown time  . insulin aspart (NOVOLOG FLEXPEN) 100 UNIT/ML FlexPen Inject 15 Units into the skin 2 (two) times daily with a meal. 3 pen 3 06/29/2016 at Unknown time  . Insulin Syringe-Needle U-100 28G X 1/2" 1 ML MISC 1 each by Does not apply route 3 (three) times daily. 100 each 11 Taking  . LEVEMIR FLEXTOUCH 100 UNIT/ML Pen INJECT 30 UNITS INTO THE SKIN DAILY 15 pen 5 06/29/2016 at Unknown time  . lisinopril (PRINIVIL,ZESTRIL) 40 MG  tablet TAKE 1 TABLET (40 MG TOTAL) BY MOUTH DAILY. 90 tablet 3 06/29/2016 at Unknown time  . sertraline (ZOLOFT) 100 MG tablet TAKE 1 TABLET BY MOUTH EVERY DAY (Patient taking differently: TAKE 100 MG BY MOUTH EVERY MORNING) 90 tablet 1 06/29/2016 at Unknown time  . spironolactone (ALDACTONE) 50 MG tablet Take 1 tablet (50 mg total) by mouth daily. (Patient taking differently: Take 50 mg by mouth every morning. ) 90 tablet 1 06/29/2016 at Unknown time  . amLODipine (NORVASC) 10 MG tablet TAKE 1 TABLET (10 MG TOTAL) BY MOUTH DAILY. (Patient not taking: Reported on 06/30/2016) 90 tablet 3 Not Taking at Unknown time  . baclofen (LIORESAL) 10 MG tablet TAKE 1/2 TABLET BY MOUTH 1 time a DAY AS NEEDED FOR MUSCLE SPASMS (Patient not taking: Reported on 06/30/2016) 30 tablet 3 Not Taking at Unknown time  . sildenafil (REVATIO) 20 MG tablet Take 2-3 tablets (40-60 mg total) by mouth daily as needed (intercourse). (Patient not taking: Reported on 06/30/2016) 30 tablet 0 Not Taking at Unknown time   Scheduled: . aspirin  325 mg Oral Daily  . atorvastatin  40 mg Oral QPM  . clopidogrel  75 mg Oral Q breakfast  . enoxaparin (LOVENOX) injection  40 mg Subcutaneous Q24H  . insulin aspart  0-5 Units Subcutaneous QHS  . insulin aspart  0-9 Units Subcutaneous TID WC  . insulin detemir  30 Units Subcutaneous Daily  . sertraline  100 mg Oral Daily  . sodium chloride flush  3 mL Intravenous Q12H    ROS:                                                                                                                                       History obtained from the patient  General ROS: negative for - chills, fatigue, fever, night sweats, weight gain or weight loss Psychological ROS: negative for - behavioral disorder, hallucinations, memory difficulties, mood swings or suicidal ideation Ophthalmic ROS: negative for - blurry vision, double vision, eye pain or loss of vision ENT ROS: negative for - epistaxis, nasal  discharge, oral lesions, sore throat, tinnitus or vertigo Allergy and Immunology ROS: negative for - hives or itchy/watery eyes Hematological and Lymphatic ROS: negative for - bleeding problems, bruising or swollen lymph nodes Endocrine ROS: negative for - galactorrhea, hair pattern changes, polydipsia/polyuria or temperature intolerance Respiratory ROS: negative for - cough, hemoptysis, shortness of breath or wheezing Cardiovascular ROS: negative for - chest pain, dyspnea on exertion, edema or irregular heartbeat Gastrointestinal ROS: negative for - abdominal pain, diarrhea, hematemesis, nausea/vomiting or stool incontinence Genito-Urinary ROS: negative for - dysuria, hematuria, incontinence or urinary frequency/urgency Musculoskeletal ROS: negative for - joint swelling or muscular weakness Neurological ROS: as noted in HPI Dermatological ROS: negative for rash and skin lesion changes  Neurologic Examination:                                                                                                      Blood pressure (!) 167/58, pulse 66, temperature 99 F (37.2 C), temperature source Oral, resp. rate 19, height 5\' 7"  (1.702 m), weight 111.1 kg (245 lb), SpO2 100 %.  HEENT-  Normocephalic, no lesions, without obvious abnormality.  Normal external eye and conjunctiva.  Normal TM's bilaterally.  Normal auditory canals and external ears. Normal external nose, mucus membranes and septum.  Normal pharynx. Cardiovascular- S1, S2 normal, pulses palpable throughout   Lungs- chest clear, no wheezing, rales, normal symmetric air entry, Heart exam - S1, S2 normal, no murmur, no gallop, rate regular Abdomen- soft, non-tender; bowel sounds normal; no masses,  no organomegaly Extremities- no edema Lymph-no adenopathy palpable Musculoskeletal-no joint tenderness, deformity or swelling Skin-warm and dry, no hyperpigmentation, vitiligo, or suspicious lesions  Neurological Examination Mental  Status: Alert, oriented to hospital but not fate or year, thought content appropriate.  Speech dysarthric without evidence of aphasia.  Able to follow 3 step commands without difficulty. Cranial Nerves: II: Discs flat bilaterally; Visual fields grossly normal,  III,IV, VI: ptosis not present, extra-ocular motions intact bilaterally, pupils equal, round, reactive to light and accommodation V,VII: smile asymmetric on the right, facial light touch sensation normal bilaterally VIII: hearing normal bilaterally IX,X: uvula rises symmetrically XI: bilateral shoulder shrug XII: midline tongue extension Motor: Right : Upper extremity   0/5    Left:     Upper extremity   5/5  Lower extremity   0/5     Lower extremity   5/5 -contracture of fingers and increased tone of the right arm Tone and bulk:normal tone throughout; no atrophy noted Sensory: Pinprick and light touch intact throughout, bilaterally Deep Tendon Reflexes: 2+ and symmetric throughout on the left with 3 + in the right arm and leg (KJ) 2+ right AJ Plantars: Right: up going   Left: downgoing Cerebellar: normal finger-to-nose on the left, unable to do Heel to shin on the left test Gait: not testeed       Lab Results: Basic Metabolic Panel:  Recent Labs Lab 06/29/16 2346 06/30/16 0507 07/01/16 0522  NA 132* 136 137  K 3.9 3.5 3.6  CL 104 109 111  CO2 20* 20* 20*  GLUCOSE 159* 138* 168*  BUN 27* 22* 16  CREATININE 1.35* 1.14 1.03  CALCIUM 8.3* 7.8* 7.8*    Liver Function Tests:  Recent Labs Lab 06/29/16 2346  AST 70*  ALT 122*  ALKPHOS 89  BILITOT 0.9  PROT 6.9  ALBUMIN 3.7   No results for input(s): LIPASE, AMYLASE in the last 168 hours. No results for input(s): AMMONIA in the last 168 hours.  CBC:  Recent Labs Lab 06/29/16 2346 06/30/16 0507 07/01/16 0522  WBC 23.0* 20.7* 15.7*  NEUTROABS 18.3*  --  12.0*  HGB 12.7* 11.4* 10.7*  HCT 37.6* 34.9* 32.9*  MCV 91.7 91.6 91.9  PLT 142* 129* 124*     Cardiac Enzymes:  Recent Labs Lab 06/29/16 2346  TROPONINI <0.03    Lipid Panel: No results for input(s): CHOL, TRIG, HDL, CHOLHDL, VLDL, LDLCALC in the last 168 hours.  CBG:  Recent Labs Lab 07/01/16 0739 07/01/16 1145 07/01/16 1640 07/01/16 2349 07/02/16 0631  GLUCAP 147* 272* 270* 170* 123*    Microbiology: Results for orders placed or performed during the hospital encounter of 06/29/16  Culture, blood (x 2)     Status: None (Preliminary result)   Collection Time: 06/29/16 11:30 PM  Result Value Ref Range Status   Specimen Description BLOOD RIGHT HAND  Final   Special Requests IN PEDIATRIC BOTTLE 3CC  Final   Culture   Final    NO GROWTH 1 DAY Performed at [facility], [place] [address], [place], [place] [address]    Report Status PENDING  Incomplete  Culture, blood (x 2)     Status: None (Preliminary result)   Collection Time: 06/29/16 11:35 PM  Result Value Ref Range Status   Specimen Description BLOOD RIGHT ANTECUBITAL  Final   Special Requests BOTTLES DRAWN AEROBIC AND ANAEROBIC 5CC EACH  Final   Culture   Final    NO GROWTH 1 DAY Performed at [facility], [place] [address] [place], [place] [address]    Report Status PENDING  Incomplete  Urine culture     Status: None   Collection Time: 06/30/16  2:19 AM  Result Value Ref Range Status   Specimen Description URINE, RANDOM  Final   Special Requests NONE  Final   Culture   Final    NO GROWTH Performed  at [facility], [place] [address] [place], [place] [address]    Report Status 07/01/2016 FINAL  Final  C difficile quick scan w PCR reflex     Status: None   Collection Time: 06/30/16  7:02 AM  Result Value Ref Range Status   C Diff antigen NEGATIVE NEGATIVE Final   C Diff toxin NEGATIVE NEGATIVE Final   C Diff interpretation No C. difficile detected.  Final  Gastrointestinal Panel by PCR , Stool     Status: None   Collection Time: 06/30/16  7:02 AM  Result Value Ref Range Status    Campylobacter species NOT DETECTED NOT DETECTED Final   Plesimonas shigelloides NOT DETECTED NOT DETECTED Final   Salmonella species NOT DETECTED NOT DETECTED Final   Yersinia enterocolitica NOT DETECTED NOT DETECTED Final   Vibrio species NOT DETECTED NOT DETECTED Final   Vibrio cholerae NOT DETECTED NOT DETECTED Final   Enteroaggregative E coli (EAEC) NOT DETECTED NOT DETECTED Final   Enteropathogenic E coli (EPEC) NOT DETECTED NOT DETECTED Final   Enterotoxigenic E coli (ETEC) NOT DETECTED NOT DETECTED Final   Shiga like toxin producing E coli (STEC) NOT DETECTED NOT DETECTED Final   Shigella/Enteroinvasive E coli (EIEC) NOT DETECTED NOT DETECTED Final   Cryptosporidium NOT DETECTED NOT DETECTED Final   Cyclospora cayetanensis NOT DETECTED NOT DETECTED Final   Entamoeba histolytica NOT DETECTED NOT DETECTED Final   Giardia lamblia NOT DETECTED NOT DETECTED Final   Adenovirus F40/41 NOT DETECTED NOT DETECTED Final   Astrovirus NOT DETECTED NOT DETECTED Final   Norovirus GI/GII NOT DETECTED NOT DETECTED Final   Rotavirus A NOT DETECTED NOT DETECTED Final   Sapovirus (I, II, IV, and V) NOT DETECTED NOT DETECTED Final    Coagulation Studies:  Recent Labs  06/29/16 2346  LABPROT 15.8*  INR 1.25    Imaging: Mr Brain Wo Contrast  Result Date: 07/01/2016 CLINICAL DATA:  66 y/o M; acute weakness, altered mental status, and multiple falls with concern for acute stroke. EXAM: MRI HEAD WITHOUT CONTRAST TECHNIQUE: Multiplanar, multiecho pulse sequences of the brain and surrounding structures were obtained without intravenous contrast. COMPARISON:  06/29/2016 CT head.  01/03/2008 MRI head. FINDINGS: Brain: Diffusion restriction within the left aspect of corpus callosum extending into left cingulate gyrus in paramedian frontal lobe compatible with acute/ early subacute infarction. 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 Chronic infarct within the left hemi pons. Moderate diffuse supratentorial and infratentorial brain parenchymal volume loss. Background of mild chronic microvascular ischemic changes in white matter. No hydrocephalus. No extra-axial collection. No abnormal susceptibility hypointensity to indicate intracranial hemorrhage. Vascular: Normal flow voids. Skull and upper cervical spine: Normal marrow signal. Sinuses/Orbits: Mild diffuse paranasal sinus mucosal thickening greatest in the maxillary sinuses. No abnormal signal of the mastoid air cells. Orbits are unremarkable. Other: None. IMPRESSION: 1. Diffusion restriction within the left aspect of corpus callosum extending into left cingulate gyrus in paramedian frontal lobe compatible with acute/ early subacute infarction, left ACA distribution. No acute hemorrhage identified. 2. Mild chronic microvascular ischemic changes and moderate diffuse brain parenchymal volume loss. Chronic left pons infarct. 3. Mild diffuse paranasal sinus disease. These results will be called to the ordering clinician or representative by the Radiologist Assistant, and communication documented in the PACS or zVision Dashboard. Electronically Signed   By: Kristine Garbe M.D.   On: 07/01/2016 14:57  Assessment and plan discussed with with attending physician and they are in agreement.    Etta Quill PA-C [facility] [phone]  07/02/2016, 11:03 AM   Assessment: 66 y.o. male with acute ischemic infarct in the ACA territory. This could be large vessel atherosclerosis or embolic. He is currently receiving a workup.  Stroke Risk Factors - diabetes mellitus  1. HgbA1c, fasting lipid panel 2. CTA head and neck 3. Frequent neuro checks 4. Echocardiogram 5. Prophylactic therapy-Antiplatelet med: Aspirin - dose 325mg  PO or 300mg  PR 6. Risk factor modification 7. Telemetry monitoring 8. PT consult, OT  consult, Speech consult 9. please page stroke NP  Or  PA  Or MD  from 8am -4 pm starting 2/1 as this patient will be followed by the stroke team at this point.   You can look them up on [email or web address]    Roland Rack, MD [facility] [phone]  If 7pm- 7am, please page neurology on call as listed in [place].

## 2016-07-02 NOTE — Evaluation (Signed)
Physical Therapy Re-Evaluation Patient Details Name: [name] MRN: [id number] DOB: 1950/10/06 Today's Date: 07/02/2016   History of Present Illness  Pt is a 66 yo male admitted with lethargy, weakness and dehydration from a few days of loose bowel movements.  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  07/02/16:  Patient with worsening speech and weakness Rt side.  MRI showed acute infarct Lt ACA distribution.  Patient transferred to [facility].  Clinical Impression  Patient with decreased strength in Rt-side and decreased speech.  Patient was ambulatory with quad cane pta.  Initial PT Eval on 06/30/16, patient able to ambulate 30' with quad cane.  Today patient with no active movement RLE.  Recommend Inpatient Rehab consult to help return patient to optimal functional level prior to discharge.    Follow Up Recommendations CIR;Supervision for mobility/OOB    Equipment Recommendations  Wheelchair (measurements PT);Wheelchair cushion (measurements PT)    Recommendations for Other Services Rehab consult     Precautions / Restrictions Precautions Precautions: Fall Precaution Comments: Rt hemiparesis Restrictions Weight Bearing Restrictions: No      Mobility  Bed Mobility Overal bed mobility: Needs Assistance Bed Mobility: Rolling;Sidelying to Sit;Sit to Sidelying Rolling: Mod assist Sidelying to sit: Max assist;HOB elevated     Sit to sidelying: Mod assist;HOB elevated General bed mobility comments: Patient rolls to Rt side using LUE to pull on bed rail.  Assist to bring trunk/LE's over.  Requires max assist to move to sitting - unable to use RUE to assist with pushing up.  Once upright, patient able to maintain sitting balance with min guard assist.  Patient sat EOB x 12 minutes.  Assist to bring RLE onto bed to return to sidelying.  Required +2 max assist to scoot toward HOB in supine.  Transfers                 General transfer comment:  NT  Ambulation/Gait                Stairs            Wheelchair Mobility    Modified Rankin (Stroke Patients Only) Modified Rankin (Stroke Patients Only) Pre-Morbid Rankin Score: Moderate disability Modified Rankin: Severe disability     Balance Overall balance assessment: Needs assistance Sitting-balance support: No upper extremity supported;Feet supported Sitting balance-Leahy Scale: Fair                                       Pertinent Vitals/Pain Pain Assessment: No/denies pain    Home Living Family/patient expects to be discharged to:: Private residence Living Arrangements: Alone Available Help at Discharge: Family;Available PRN/intermittently (Patient reports family member could stay with him at d/c) Type of Home: House Home Access: Level entry     Home Layout: One level Home Equipment: Cane - quad;Bedside commode;Tub bench      Prior Function Level of Independence: Needs assistance   Gait / Transfers Assistance Needed: walks with quad cane without assist on flat surfaces  ADL's / Homemaking Assistance Needed: Pt states he bathes and dresses himself, does simple cooking.  Family helps with cleaning and brings groceries.  Uses [employer] for transportation.        Hand Dominance   Dominant Hand: Right (Uses LUE due to prior CVA)    Extremity/Trunk Assessment   Upper Extremity Assessment Upper Extremity Assessment: RUE deficits/detail;Defer to OT  evaluation    Lower Extremity Assessment Lower Extremity Assessment: RLE deficits/detail RLE Deficits / Details: No movement noted RLE.  Noted increased tone. RLE Coordination: decreased gross motor       Communication   Communication: Expressive difficulties (Dysarthric;  Noted patient choking on saliva)  Cognition Arousal/Alertness: Awake/alert Behavior During Therapy: WFL for tasks assessed/performed;Flat affect Overall Cognitive Status: Difficult to assess                  General Comments: Pt does fairly well cognitively, alert and oriented following all commands.  Pt is slow to process and with decreased problem solving skillls.      General Comments      Exercises     Assessment/Plan    PT Assessment Patient needs continued PT services  PT Problem List Decreased strength;Decreased range of motion;Decreased activity tolerance;Decreased balance;Decreased mobility;Decreased cognition;Decreased knowledge of use of DME;Obesity;Decreased coordination;Impaired tone          PT Treatment Interventions DME instruction;Gait training;Functional mobility training;Therapeutic activities;Therapeutic exercise;Balance training;Cognitive remediation;Patient/family education;Neuromuscular re-education;Wheelchair mobility training    PT Goals (Current goals can be found in the Care Plan section)  Acute Rehab PT Goals Patient Stated Goal: to get better PT Goal Formulation: With patient Time For Goal Achievement: 07/16/16 Potential to Achieve Goals: Good    Frequency Min 3X/week   Barriers to discharge Decreased caregiver support Unsure of family support.  Patient reports family could stay with patient at d/c.  Need to verify.    Co-evaluation               End of Session   Activity Tolerance: Patient tolerated treatment well;Patient limited by fatigue Patient left: in bed;with call bell/phone within reach;with bed alarm set Nurse Communication: Mobility status         Time: 1427-1500 PT Time Calculation (min) (ACUTE ONLY): 33 min   Charges:   PT Evaluation $PT Re-evaluation: 1 Procedure PT Treatments $Therapeutic Activity: 8-22 mins   PT G Codes:        Despina Pole 07-29-2016, 3:52 PM Carita Pian. Sanjuana Kava, [place] Pager [phone]

## 2016-07-02 NOTE — Progress Notes (Signed)
Inpatient Rehabilitation  PT has evaluated pt. and is recommending IP Rehab.  Patient was screened by Kresha Abelson for appropriateness for an Inpatient Acute Rehab consult.  At this time, we are recommending Inpatient Rehab consult.  Please order consult if you are agreeable.  Callan Norden PT Inpatient Rehab Admissions Coordinator Cell [phone] Office [phone]   

## 2016-07-03 DIAGNOSIS — G9340 Encephalopathy, unspecified: Secondary | ICD-10-CM

## 2016-07-03 DIAGNOSIS — R197 Diarrhea, unspecified: Secondary | ICD-10-CM

## 2016-07-03 DIAGNOSIS — N179 Acute kidney failure, unspecified: Secondary | ICD-10-CM

## 2016-07-03 DIAGNOSIS — I63522 Cerebral infarction due to unspecified occlusion or stenosis of left anterior cerebral artery: Principal | ICD-10-CM

## 2016-07-03 DIAGNOSIS — I10 Essential (primary) hypertension: Secondary | ICD-10-CM

## 2016-07-03 DIAGNOSIS — G8111 Spastic hemiplegia affecting right dominant side: Secondary | ICD-10-CM

## 2016-07-03 LAB — CBC WITH DIFFERENTIAL/PLATELET
BASOS ABS: 0 10*3/uL (ref 0.0–0.1)
Basophils Relative: 0 %
Eosinophils Absolute: 0.3 10*3/uL (ref 0.0–0.7)
Eosinophils Relative: 3 %
HEMATOCRIT: 34.6 % — AB (ref 39.0–52.0)
Hemoglobin: 11.2 g/dL — ABNORMAL LOW (ref 13.0–17.0)
LYMPHS PCT: 23 %
Lymphs Abs: 2.6 10*3/uL (ref 0.7–4.0)
MCH: 29.6 pg (ref 26.0–34.0)
MCHC: 32.4 g/dL (ref 30.0–36.0)
MCV: 91.5 fL (ref 78.0–100.0)
MONO ABS: 0.7 10*3/uL (ref 0.1–1.0)
Monocytes Relative: 6 %
NEUTROS ABS: 7.7 10*3/uL (ref 1.7–7.7)
Neutrophils Relative %: 68 %
Platelets: 148 10*3/uL — ABNORMAL LOW (ref 150–400)
RBC: 3.78 MIL/uL — AB (ref 4.22–5.81)
RDW: 12.5 % (ref 11.5–15.5)
WBC: 11.4 10*3/uL — ABNORMAL HIGH (ref 4.0–10.5)

## 2016-07-03 LAB — GLUCOSE, CAPILLARY
GLUCOSE-CAPILLARY: 91 mg/dL (ref 65–99)
GLUCOSE-CAPILLARY: 224 mg/dL — AB (ref 65–99)
Glucose-Capillary: 185 mg/dL — ABNORMAL HIGH (ref 65–99)
Glucose-Capillary: 194 mg/dL — ABNORMAL HIGH (ref 65–99)

## 2016-07-03 LAB — BASIC METABOLIC PANEL
ANION GAP: 9 (ref 5–15)
BUN: 10 mg/dL (ref 6–20)
CO2: 22 mmol/L (ref 22–32)
Calcium: 8.4 mg/dL — ABNORMAL LOW (ref 8.9–10.3)
Chloride: 107 mmol/L (ref 101–111)
Creatinine, Ser: 0.86 mg/dL (ref 0.61–1.24)
GFR calc Af Amer: >60 mL/min (ref >60)
GFR calc non Af Amer: >60 mL/min (ref >60)
GLUCOSE: 117 mg/dL — AB (ref 65–99)
POTASSIUM: 3.9 mmol/L (ref 3.5–5.1)
Sodium: 138 mmol/L (ref 135–145)

## 2016-07-03 NOTE — Care Management Note (Signed)
Case Management Note  Patient Details  Name: [name] MRN: [id number] Date of Birth: 08-15-1950  Subjective/Objective:                    Action/Plan: PT recommendation is for CIR. OT recommending SNF. CM following for d/c disposition.   Expected Discharge Date:                  Expected Discharge Plan:  IP Rehab Facility  In-House Referral:  Clinical Social Work  Discharge planning Services  CM Consult  Post Acute Care Choice:    Choice offered to:     DME Arranged:    DME Agency:     HH Arranged:    [place] Agency:     Status of Service:  In process, will continue to follow  If discussed at Long Length of Stay Meetings, dates discussed:    Additional Comments:  Pollie Friar, RN 07/03/2016, 11:36 AM

## 2016-07-03 NOTE — Progress Notes (Signed)
STROKE TEAM PROGRESS NOTE   HISTORY OF PRESENT ILLNESS (per record) [name] is an 66 y.o. male been lethargic for 3 days, and had multiple falls. When I saw patient in ED, he is mildly confused, but oriented 3. He answered all questions appropriately. He has right-sided hemiparesis which is likely from previous stroke. Patient reports that he has diarrhea that started yesterday, he had at least 3 bowel movements with watery stool. WBC in ED was elevated at 23, INR 1.25, Na 132 and CT head negative. Admitted for acute encephalopathy/sepsis.  MRI was obtained showing acute CVA. Takes ASA by does not take every day due forgetting to get it. His last known well was unable to be determined. Patient was not administered IV t-PA secondary to being outside the window. He was admitted for further evaluation and treatment.   SUBJECTIVE (INTERVAL HISTORY) No family is at bedside. He is sitting in bed for lunch. No acute event overnight. He still has right hemiplegia with increased tone.    OBJECTIVE Temp:  [98.6 F (37 C)-99.4 F (37.4 C)] 98.6 F (37 C) (02/01 1304) Pulse Rate:  [50-77] 64 (02/01 1304) Cardiac Rhythm: Sinus bradycardia (02/01 0700) Resp:  [19-20] 20 (02/01 1304) BP: (156-185)/(53-65) 177/65 (02/01 1304) SpO2:  [99 %-100 %] 99 % (02/01 1304)  CBC:  Recent Labs Lab 07/01/16 0522 07/03/16 0345  WBC 15.7* 11.4*  NEUTROABS 12.0* 7.7  HGB 10.7* 11.2*  HCT 32.9* 34.6*  MCV 91.9 91.5  PLT 124* 148*    Basic Metabolic Panel:  Recent Labs Lab 07/01/16 0522 07/03/16 0345  NA 137 138  K 3.6 3.9  CL 111 107  CO2 20* 22  GLUCOSE 168* 117*  BUN 16 10  CREATININE 1.03 0.86  CALCIUM 7.8* 8.4*    Lipid Panel:    Component Value Date/Time   CHOL 131 09/25/2015 1149   TRIG 53 09/25/2015 1149   HDL 41 09/25/2015 1149   CHOLHDL 3.2 09/25/2015 1149   VLDL 11 09/25/2015 1149   LDLCALC 79 09/25/2015 1149   HgbA1c:  Lab Results  Component Value Date   HGBA1C 7.2  09/25/2015   Urine Drug Screen:    Component Value Date/Time   LABOPIA NONE DETECTED 06/30/2016 0221   COCAINSCRNUR NONE DETECTED 06/30/2016 0221   LABBENZ NONE DETECTED 06/30/2016 0221   AMPHETMU NONE DETECTED 06/30/2016 0221   THCU NONE DETECTED 06/30/2016 0221   LABBARB NONE DETECTED 06/30/2016 0221      IMAGING I have personally reviewed the radiological images below and agree with the radiology interpretations.  Ct Angio Head W Or Wo Contrast Ct Angio Neck W Or Wo Contrast 07/02/2016  Low-density now visible in the region of acute infarction affecting the left corpus callosum and cingulate gyrus. No hemorrhage. 50% irregular stenosis of the proximal left subclavian artery. 30% stenosis in the proximal ICA on both sides. 50% stenosis of the right cervical ICA just beneath the skullbase. Atherosclerotic disease at both proximal vertebral arteries worse on the right than the left. Stenoses in those regions estimated at 50-70% bilaterally. 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 There is some flow more distal to that within the left ACA, which could be reconstituted or collateral. 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 Stenosis of the distal left vertebral artery, which does remain patent and give  flow to the basilar. Posterior circulation branch vessels show flow.   Ct Head Wo Contrast 06/30/2016 1. No acute intracranial process identified. 2. Mild chronic microvascular ischemic disease.   Mr Brain Wo Contrast 07/01/2016 1. Diffusion restriction within the left aspect of corpus callosum extending into left cingulate gyrus in paramedian frontal lobe compatible with acute/ early subacute infarction, left ACA distribution. No acute hemorrhage identified. 2. Mild chronic microvascular  ischemic changes and moderate diffuse brain parenchymal volume loss. Chronic left pons infarct. 3. Mild diffuse paranasal sinus disease.    2-D echocardiogram - Left ventricle: The cavity size was normal. Wall thickness was increased in a pattern of mild LVH. Systolic function was normal. The estimated ejection fraction was in the range of 55% to 60%. Wall motion was normal; there were no regional wall motion abnormalities. Features are consistent with a pseudonormal left ventricular filling pattern, with concomitant abnormal relaxation and increased filling pressure (grade 2 diastolic dysfunction). - Aortic valve: Mildly calcified annulus. There was trivial regurgitation.   PHYSICAL EXAM  Temp:  [98.6 F (37 C)-99.4 F (37.4 C)] 98.9 F (37.2 C) (02/01 2100) Pulse Rate:  [50-67] 51 (02/01 2100) Resp:  [20] 20 (02/01 2100) BP: (156-196)/(54-65) 196/64 (02/01 2100) SpO2:  [99 %-100 %] 100 % (02/01 2100)  General - obesity, well developed, in no apparent distress.  Ophthalmologic - Fundi not visualized due to noncooperation.  Cardiovascular - Regular rate and rhythm.  Mental Status -  Level of arousal and orientation to time, place, and person were intact. Language including expression, naming, repetition, comprehension was assessed and found intact. Slow scanning speech with dysarthria  Cranial Nerves II - XII - II - Visual field intact OU. III, IV, VI - Extraocular movements intact, left mild ptosis, PERRL, EOMI V - Facial sensation intact bilaterally. VII - left facial droop VIII - Hearing & vestibular intact bilaterally. X - Palate elevates symmetrically. XI - Chin turning & shoulder shrug intact bilaterally. XII - Tongue protrusion intact.  Motor Strength - The patient's strength was normal in LUE and LLE, but 0/5 RUE and RLE.  Bulk was normal and fasciculations were absent.   Motor Tone - Muscle tone was assessed at the neck and appendages and was increased muscle tone at  RUE and RLE.  Reflexes - The patient's reflexes were 1+ in all extremities except 3+ RUE and RLE and he had no pathological reflexes.  Sensory - Light touch, temperature/pinprick were assessed and were symmetrical.    Coordination - The patient had normal movements in the left hand with no ataxia or dysmetria.  Tremor was absent.  Gait and Station - not able to test   ASSESSMENT/PLAN [name] is a 66 y.o. male with history of hypertension, diabetes, stroke with right hemiparesis, PVD, DVT, chronic kidney disease stage II presenting with lethargy 3 days and multiple falls. He did not receive IV t-PA due to unknown last known well. MRI shows acute stroke.  Stroke:  Left ACA infarct likely secondary to large vessel disease source given multiple stroke risk factors and severe intracranial stenosis  Resultant  Right hemiplegia, right facial droop, dysarthria  MRI  left ACA infarct   CTA head & neck - Right greater than left atherosclerotic disease VAs within 50-70% stenosis bilaterally. Occlusion or near occlusion, left ACA. Right VA occlusion distally, just beyond PICA.  2D Echo  EF 55-60% with no source of embolus  LDL pending  HgbA1c pending  Lovenox 40 mg sq daily  for VTE prophylaxis  Diet Carb Modified Fluid consistency: Thin; Room service appropriate? Yes  aspirin 81 mg daily and clopidogrel 75 mg daily prior to admission, now on aspirin 325 mg daily and clopidogrel 75 mg daily. Continue DAPT for 3 months and then plavix alone   Patient counseled to be compliant with his antithrombotic medications  Ongoing aggressive stroke risk factor management  Therapy recommendations:  CIR  Disposition:  pending   Hypertension  Stable  Permissive hypertension (OK if < 220/120) but gradually normalize in 5-7 days  Long-term BP goal normotensive  Hyperlipidemia  Home meds:  Lipitor 40, resumed in hospital  LDL pending, goal < 70  Continue statin at  discharge  Diabetes  HgbA1c pending, goal < 7.0  On levemir  SSI  CBG monitoring  Other Stroke Risk Factors  Advanced age  Obesity, Body mass index is 38.37 kg/m., recommend weight loss, diet and exercise as appropriate   Hx stroke/TIA  Peripheral vascular disease  History of DVT  Other Active Problems  Acute encephalopathy  Acute kidney injury, resolved  Diarrhea, resolved  Systemic inflammatory response syndrome  History of depression on [facility] day # 3  Neurology will sign off. Please call with questions. Pt will follow up with carolyn martin NP at [facility] in about 6 weeks. Thanks for the consult.  Rosalin Hawking, MD PhD Stroke Neurology 07/03/2016 10:41 PM     To contact Stroke Continuity provider, please refer to [email or web address] After hours, contact General Neurology

## 2016-07-03 NOTE — Evaluation (Signed)
Speech Language Pathology Evaluation Patient Details Name: [name] MRN: [id number] DOB: 27-Nov-1950 Today's Date: 07/03/2016 Time: [id number] SLP Time Calculation (min) (ACUTE ONLY): 20 min  Problem List:  Patient Active Problem List   Diagnosis Date Noted  . Cerebrovascular accident (CVA) ([place])   . Altered mental status 06/30/2016  . Fall 06/30/2016  . Acute encephalopathy 06/30/2016  . Sepsis ([place]) 06/30/2016  . Diarrhea 06/30/2016  . Acute renal failure superimposed on stage 2 chronic kidney disease ([place]) 06/30/2016  . Hemiparesis affecting right side as late effect of stroke ([place]) 10/23/2015  . PAD (peripheral artery disease) ([place]) 10/20/2011  . Hyperlipidemia 02/21/2010  . CEREBROVASCULAR ACCIDENT, HX OF 12/22/2008  . Diabetes mellitus, type II ([place]) 07/30/2006  . OBESITY, NOS 07/30/2006  . Essential hypertension, benign 07/30/2006   Past Medical History:  Past Medical History:  Diagnosis Date  . 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 CVA (cerebral infarction) 01/03/2008   MRI: Acute 1 x 1.5 cm infarction affecting the left side of the pons.  . Diabetes mellitus   . DVT (deep venous thrombosis) ([place])   . Hypertension   . PAD (peripheral artery disease) ([place])   . Stroke [facility])    2008   Past Surgical History:  Past Surgical History:  Procedure Laterality Date  . None     HPI:  Pt is a 66 yo male admitted with lethargy, weakness and dehydration from a few days of loose bowel movements.  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  07/02/16:  Patient with worsening speech and weakness Rt side.  MRI showed acute infarct Lt ACA distribution.  Patient transferred to [facility].   Assessment / Plan / Recommendation Clinical Impression  Pt appears to be at his cognitive-lingusitic baseline, but does have impaired speech intelligibility. He has a slow rate, imprecise articulation, and intermittent groping for words. Recommend SLP f/u to maximize  functional communication.    SLP Assessment  Patient needs continued Speech Lanaguage Pathology Services    Follow Up Recommendations  Inpatient Rehab    Frequency and Duration min 2x/week  2 weeks      SLP Evaluation Cognition  Overall Cognitive Status: History of cognitive impairments - at baseline       Comprehension  Auditory Comprehension Overall Auditory Comprehension: Appears within functional limits for tasks assessed    Expression Expression Primary Mode of Expression: Verbal Verbal Expression Overall Verbal Expression: Appears within functional limits for tasks assessed   Oral / Motor  Motor Speech Overall Motor Speech: Impaired Respiration: Within functional limits Phonation: Normal Resonance: Within functional limits Articulation: Impaired Level of Impairment: Conversation Intelligibility: Intelligibility reduced Conversation: 75-100% accurate Motor Planning: Impaired 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 CCC-SLP [phone]

## 2016-07-03 NOTE — Progress Notes (Signed)
PROGRESS NOTE        PATIENT DETAILS Name: [name] Age: 66 y.o. Sex: male Date of Birth: Dec 27, 1950 Admit Date: 06/29/2016 Admitting Physician Ivor Costa, MD [id number] Criss Rosales, MD  Brief Narrative: Patient is a 66 y.o. male with a priorhistoryofCVAandchronicrighthemiplegia diabetes, hypertension presented with dysarthria, lethargy and fall along with diarrhea. Further evaluation revealed a acute CVA in the left frontal lobe area. He was subsequently admitted to the hospital for further evaluation and treatment. Below for further details  Subjective: Dysarthria continues. Otherwise he is awake and alert. Has chronic right-sided hemiplegia that seems to be unchanged.  Assessment/Plan: Acute ischemic CVA: Workup in progress, CT angiogram neck does not show any significant stenosis, however it does show occlusion or near occlusion of the left anterior cerebral artery. Echocardiogram shows preserved EF without any obvious embolic source. A1c and lipid panel are currently pending. Telemetry-negative for atrial fibrillation. He was already on dual antiplatelet therapy with aspirin and Plavix before this hospitalization-this is currently being continued. Lipitor also being continued. He has chronic right-sided hemiplegia that seems essentially unchanged. Awaiting further recommendations from neurology. CIR evaluation in progress  Acute encephalopathy: Seems to have resolved-he is dysarthric and slow but following commands. Suspect is multifactorial-due to CVA, dehydration, gastroenteritis.  Acute kidney injury: Resolved. Likely mild hemodynamic mediated kidney injury due to diarrhea.  Diarrhea: Seems to have resolved, stool C. difficile panel and GI pathogen panel negative. Supportive care   Systemic inflammatory response syndrome: Patient did have significant leukocytosis on admission-but I doubt sepsis pathophysiology. Leukocytosis probably was secondary to  diarrheal illness, acute CVA..Cultures, stool studies continue to be negative. Leukocytosis has almost resolved without the use of any antimicrobial therapy.  Insulin-dependent type 2 diabetes: CBGs stable, A1c pending. Continue Levemir 30 units daily along with SSI. Follow and adjust accordingly  Dyslipidemia: Continue statin-await lipid panel  Hypertension: Allow permissive hypertension in the next few days-resume antihypertensives in the next few days.  History of depression: Appears stable-continue Zoloft  DVT Prophylaxis: Prophylactic Lovenox   Code Status: Full code   Family Communication: None at bedside  Disposition Plan: Remain inpatient  Antimicrobial agents: Anti-infectives    Start     Dose/Rate Route Frequency Ordered Stop   06/30/16 0345  metroNIDAZOLE (FLAGYL) tablet 500 mg     500 mg Oral Once 06/30/16 0334 06/30/16 0543   06/30/16 0230  piperacillin-tazobactam (ZOSYN) IVPB 3.375 g     3.375 g 12.5 mL/hr over 240 Minutes Intravenous Once 06/30/16 0220 06/30/16 [id number]     Procedures: Echo 1/31>> - Left ventricle: The cavity size was normal. Wall thickness was   increased in a pattern of mild LVH. Systolic function was normal.   The estimated ejection fraction was in the range of 55% to 60%.   Wall motion was normal; there were no regional wall motion   abnormalities. Features are consistent with a pseudonormal left   ventricular filling pattern, with concomitant abnormal relaxation   and increased filling pressure (grade 2 diastolic dysfunction). - Aortic valve: Mildly calcified annulus. There was trivial   regurgitation.  CONSULTS:  neurology  Time spent: 25- minutes-Greater than 50% of this time was spent in counseling, explanation of diagnosis, planning of further management, and coordination of care.  MEDICATIONS: Scheduled Meds: . aspirin  325 mg Oral Daily  . atorvastatin  40 mg Oral  QPM  . clopidogrel  75 mg Oral Q breakfast  . enoxaparin  (LOVENOX) injection  40 mg Subcutaneous Q24H  . insulin aspart  0-5 Units Subcutaneous QHS  . insulin aspart  0-9 Units Subcutaneous TID WC  . insulin detemir  30 Units Subcutaneous Daily  . sertraline  100 mg Oral Daily  . sodium chloride flush  3 mL Intravenous Q12H   Continuous Infusions: PRN Meds:.acetaminophen **OR** acetaminophen, hydrALAZINE, ondansetron **OR** ondansetron (ZOFRAN) IV   PHYSICAL EXAM: Vital signs: Vitals:   07/02/16 2105 07/03/16 0118 07/03/16 0645 07/03/16 0928  BP: (!) 185/56 (!) 165/63 (!) 156/54 (!) 159/58  Pulse: (!) 54 (!) 50 (!) 51 67  Resp: 20 20 20 20   Temp: 99.4 F (37.4 C) 98.7 F (37.1 C) 99.2 F (37.3 C) 99.4 F (37.4 C)  TempSrc: Oral Oral Oral Oral  SpO2: 99% 100% 100% 99%  Weight:      Height:       Filed Weights   06/30/16 0500 07/01/16 2222  Weight: 88.8 kg (195 lb 12.3 oz) 111.1 kg (245 lb)   Body mass index is 38.37 kg/m.   General appearance :Awake, alert, not in any distress.dysarthric. Eyes:, pupils equally reactive to light and accomodation,no scleral icterus.Pink conjunctiva HEENT: Atraumatic and Normocephalic Neck: supple, no JVD. No cervical lymphadenopathy. No thyromegaly Resp:Good air entry bilaterally, no added sounds  CVS: S1 S2 regular GI: Bowel sounds present, Non tender and not distended with no gaurding, rigidity or rebound.No organomegaly Extremities: B/L Lower Ext shows no edema, both legs are warm to touch Neurology:  Chronic right-sided hemiplegia-0/5-continues to be dysarthric. Psychiatric: Normal judgment and insight. Alert and oriented x 3. Normal mood. Musculoskeletal:No digital cyanosis Skin:No Rash, warm and dry Wounds:N/A  I have personally reviewed following labs and imaging studies  LABORATORY DATA: CBC:  Recent Labs Lab 06/29/16 2346 06/30/16 0507 07/01/16 0522 07/03/16 0345  WBC 23.0* 20.7* 15.7* 11.4*  NEUTROABS 18.3*  --  12.0* 7.7  HGB 12.7* 11.4* 10.7* 11.2*  HCT 37.6* 34.9*  32.9* 34.6*  MCV 91.7 91.6 91.9 91.5  PLT 142* 129* 124* 148*    Basic Metabolic Panel:  Recent Labs Lab 06/29/16 2346 06/30/16 0507 07/01/16 0522 07/03/16 0345  NA 132* 136 137 138  K 3.9 3.5 3.6 3.9  CL 104 109 111 107  CO2 20* 20* 20* 22  GLUCOSE 159* 138* 168* 117*  BUN 27* 22* 16 10  CREATININE 1.35* 1.14 1.03 0.86  CALCIUM 8.3* 7.8* 7.8* 8.4*    GFR: Estimated Creatinine Clearance: 101.9 mL/min (by C-G formula based on SCr of 0.86 mg/dL).  Liver Function Tests:  Recent Labs Lab 06/29/16 2346  AST 70*  ALT 122*  ALKPHOS 89  BILITOT 0.9  PROT 6.9  ALBUMIN 3.7   No results for input(s): LIPASE, AMYLASE in the last 168 hours. No results for input(s): AMMONIA in the last 168 hours.  Coagulation Profile:  Recent Labs Lab 06/29/16 2346  INR 1.25    Cardiac Enzymes:  Recent Labs Lab 06/29/16 2346  TROPONINI <0.03    BNP (last 3 results) No results for input(s): PROBNP in the last 8760 hours.  HbA1C: No results for input(s): HGBA1C in the last 72 hours.  CBG:  Recent Labs Lab 07/02/16 0631 07/02/16 1127 07/02/16 1637 07/02/16 2104 07/03/16 0620  GLUCAP 123* 256* 177* 144* 91    Lipid Profile: No results for input(s): CHOL, HDL, LDLCALC, TRIG, CHOLHDL, LDLDIRECT in the last 72 hours.  Thyroid Function  Tests: No results for input(s): TSH, T4TOTAL, FREET4, T3FREE, THYROIDAB in the last 72 hours.  Anemia Panel: No results for input(s): VITAMINB12, FOLATE, FERRITIN, TIBC, IRON, RETICCTPCT in the last 72 hours.  Urine analysis:    Component Value Date/Time   COLORURINE YELLOW 06/30/2016 0221   APPEARANCEUR CLEAR 06/30/2016 0221   LABSPEC 1.019 06/30/2016 0221   PHURINE 5.0 06/30/2016 0221   GLUCOSEU NEGATIVE 06/30/2016 0221   HGBUR LARGE (A) 06/30/2016 0221   BILIRUBINUR NEGATIVE 06/30/2016 0221   KETONESUR NEGATIVE 06/30/2016 0221   PROTEINUR NEGATIVE 06/30/2016 0221   UROBILINOGEN 1.0 01/09/2008 0010   NITRITE NEGATIVE  06/30/2016 0221   LEUKOCYTESUR NEGATIVE 06/30/2016 0221    Sepsis Labs: Lactic Acid, Venous    Component Value Date/Time   LATICACIDVEN 0.8 06/30/2016 0507    MICROBIOLOGY: Recent Results (from the past 240 hour(s))  Culture, blood (x 2)     Status: None (Preliminary result)   Collection Time: 06/29/16 11:30 PM  Result Value Ref Range Status   Specimen Description BLOOD RIGHT HAND  Final   Special Requests IN PEDIATRIC BOTTLE 3CC  Final   Culture   Final    NO GROWTH 2 DAYS Performed at [facility], [place] [address], [place], [place] [address]    Report Status PENDING  Incomplete  Culture, blood (x 2)     Status: None (Preliminary result)   Collection Time: 06/29/16 11:35 PM  Result Value Ref Range Status   Specimen Description BLOOD RIGHT ANTECUBITAL  Final   Special Requests BOTTLES DRAWN AEROBIC AND ANAEROBIC 5CC EACH  Final   Culture   Final    NO GROWTH 2 DAYS Performed at [facility], [place] [address], [place], [place] [address]    Report Status PENDING  Incomplete  Urine culture     Status: None   Collection Time: 06/30/16  2:19 AM  Result Value Ref Range Status   Specimen Description URINE, RANDOM  Final   Special Requests NONE  Final   Culture   Final    NO GROWTH Performed at [facility], [address], [place], [place] [address]    Report Status 07/01/2016 FINAL  Final  C difficile quick scan w PCR reflex     Status: None   Collection Time: 06/30/16  7:02 AM  Result Value Ref Range Status   C Diff antigen NEGATIVE NEGATIVE Final   C Diff toxin NEGATIVE NEGATIVE Final   C Diff interpretation No C. difficile detected.  Final  Gastrointestinal Panel by PCR , Stool     Status: None   Collection Time: 06/30/16  7:02 AM  Result Value Ref Range Status   Campylobacter species NOT DETECTED NOT DETECTED Final   Plesimonas shigelloides NOT DETECTED NOT DETECTED Final   Salmonella species NOT DETECTED NOT DETECTED Final   Yersinia enterocolitica  NOT DETECTED NOT DETECTED Final   Vibrio species NOT DETECTED NOT DETECTED Final   Vibrio cholerae NOT DETECTED NOT DETECTED Final   Enteroaggregative E coli (EAEC) NOT DETECTED NOT DETECTED Final   Enteropathogenic E coli (EPEC) NOT DETECTED NOT DETECTED Final   Enterotoxigenic E coli (ETEC) NOT DETECTED NOT DETECTED Final   Shiga like toxin producing E coli (STEC) NOT DETECTED NOT DETECTED Final   Shigella/Enteroinvasive E coli (EIEC) NOT DETECTED NOT DETECTED Final   Cryptosporidium NOT DETECTED NOT DETECTED Final   Cyclospora cayetanensis NOT DETECTED NOT DETECTED Final   Entamoeba histolytica NOT DETECTED NOT DETECTED Final  Giardia lamblia NOT DETECTED NOT DETECTED Final   Adenovirus F40/41 NOT DETECTED NOT DETECTED Final   Astrovirus NOT DETECTED NOT DETECTED Final   Norovirus GI/GII NOT DETECTED NOT DETECTED Final   Rotavirus A NOT DETECTED NOT DETECTED Final   Sapovirus (I, II, IV, and V) NOT DETECTED NOT DETECTED Final    RADIOLOGY STUDIES/RESULTS: Ct Angio Head W Or Wo Contrast  Result Date: 07/02/2016 CLINICAL DATA:  Hypertension and right hemiparesis. Slurred speech. Lethargy. EXAM: CT ANGIOGRAPHY HEAD AND NECK TECHNIQUE: Multidetector CT imaging of the head and neck was performed using the standard protocol during bolus administration of intravenous contrast. Multiplanar CT image reconstructions and MIPs were obtained to evaluate the vascular anatomy. Carotid stenosis measurements (when applicable) are obtained utilizing NASCET criteria, using the distal internal carotid diameter as the denominator. CONTRAST:  100 cc Isovue 370 COMPARISON:  MRI same day FINDINGS: CT HEAD FINDINGS Brain: Low-density now evident affecting the left corpus callosum and cingulate gyrus consistent with acute infarction. No hemorrhage. 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 No hydrocephalus. Vascular: There is atherosclerotic calcification  of the major vessels at the base of the brain. Skull: Negative Sinuses: Clear Orbits: Negative Review of the MIP images confirms the above findings CTA NECK FINDINGS Aortic arch: Aortic atherosclerosis. No aneurysm or dissection. Common origin of the innominate artery and left common carotid artery. Right carotid system: Common carotid artery widely patent to the bifurcation. Atherosclerotic disease at the carotid bifurcation, primarily calcified. Minimal diameter of the ICA is 3.5 mm. This indicates a 30% stenosis. Atherosclerotic disease at the skullbase results in narrowing to a diameter of 2.5 mm. This indicates a 50% stenosis. Left carotid system: Common carotid artery widely patent to the bifurcation. Atherosclerotic change in the ICA bulb, primarily calcified. Minimal diameter is 3.5 mm, consistent with 30% stenosis. Cervical ICA is widely patent beyond that. Vertebral arteries: 50% irregular stenosis of the proximal left subclavian artery. Atherosclerotic calcification at both vertebral arteries and proximal vertebral arteries. Detail limited by shoulder density. Stenosis estimated at 50-70% on both sides. Beyond that, the vessels show some atherosclerotic change in the cervical region but are patent to the foramen magnum. Severe stenosis of the distal left vertebral artery at the foramen magnum, estimated at 80%. Skeleton: Ordinary spondylosis Other neck: No significant soft tissue lesion. Upper chest: Negative Review of the MIP images confirms the above findings CTA HEAD FINDINGS Anterior circulation: Internal carotid artery is patent through the siphon region. There is extensive atherosclerotic calcification in the carotid siphon region with stenosis estimated at 50-70%. Supraclinoid ICA is patent. There is flow in both the right middle cerebral artery and right anterior cerebral artery. Left ICA is patent through the siphon region. Again there is extensive calcification with stenosis estimated at 50-70%.  Anterior and middle cerebral vessels are patent proximally. The left anterior cerebral artery is occluded or nearly occluded 1.5 cm beyond its origin. Some diminished flow beyond that could be collateral or reconstituted flow. Posterior circulation: Right vertebral artery is occluded after Pike up. As noted above, 80% stenosis of the left vertebral artery at the foramen magnum level. Beyond that, the vessel is small and irregular but does reach the basilar. No basilar stenosis. Superior cerebellar and posterior cerebral vessels show flow. Venous sinuses: Patent and normal Anatomic variants: None significant Delayed phase: No abnormal enhancement Review of the MIP images confirms the above findings IMPRESSION: Low-density now visible in the region of acute  infarction affecting the left corpus callosum and cingulate gyrus. No hemorrhage. 50% irregular stenosis of the proximal left subclavian artery. 30% stenosis in the proximal ICA on both sides. 50% stenosis of the right cervical ICA just beneath the skullbase. Atherosclerotic disease at both proximal vertebral arteries worse on the right than the left. Stenoses in those regions estimated at 50-70% bilaterally. 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 There is some flow more distal to that within the left ACA, which could be reconstituted or collateral. 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 Posterior circulation branch vessels show flow. Electronically Signed   By: Nelson Chimes M.D.   On: 07/02/2016 20:35   Dg Chest 2 View  Result Date: 06/30/2016 CLINICAL DATA:  Lethargy 2-3 days and foul-smelling urine. Several recent falls. EXAM: CHEST  2 VIEW COMPARISON:   01/08/2008 FINDINGS: Lungs are hypoinflated without focal airspace consolidation or effusion. Cardiomediastinal silhouette is within normal. There is calcified plaque over the aortic arch. There are degenerative changes of the spine. IMPRESSION: Hypoinflation without acute cardiopulmonary disease. Aortic atherosclerosis. Electronically Signed   By: Marin Olp M.D.   On: 06/30/2016 00:02   Ct Head Wo Contrast  Result Date: 06/30/2016 CLINICAL DATA:  Initial evaluation for acute weakness. Multiple falls. EXAM: CT HEAD WITHOUT CONTRAST TECHNIQUE: Contiguous axial images were obtained from the base of the skull through the vertex without intravenous contrast. COMPARISON:  Prior MRI from 01/03/2008. FINDINGS: Brain: Age appropriate cerebral atrophy with mild chronic small vessel ischemic disease. No acute intracranial hemorrhage. No evidence for acute large vessel territory infarct. No mass lesion, midline shift or mass effect. No hydrocephalus. Posterior fossa cyst again noted. No other extra-axial fluid collection. Vascular: No hyperdense vessel. Scattered vascular calcifications noted within the carotid siphons. Skull: Scalp soft tissues demonstrate no acute abnormality. Calvarium intact. Sinuses/Orbits: Globes and orbital soft tissues within normal limits. Mild mucosal thickening within the ethmoidal air cells and maxillary sinuses. Paranasal sinuses are otherwise clear. No mastoid effusion. IMPRESSION: 1. No acute intracranial process identified. 2. Mild chronic microvascular ischemic disease. Electronically Signed   By: Jeannine Boga M.D.   On: 06/30/2016 00:39   Ct Angio Neck W Or Wo Contrast  Result Date: 07/02/2016 CLINICAL DATA:  Hypertension and right hemiparesis. Slurred speech. Lethargy. EXAM: CT ANGIOGRAPHY HEAD AND NECK TECHNIQUE: Multidetector CT imaging of the head and neck was performed using the standard protocol during bolus administration of intravenous contrast. Multiplanar CT  image reconstructions and MIPs were obtained to evaluate the vascular anatomy. Carotid stenosis measurements (when applicable) are obtained utilizing NASCET criteria, using the distal internal carotid diameter as the denominator. CONTRAST:  100 cc Isovue 370 COMPARISON:  MRI same day FINDINGS: CT HEAD FINDINGS Brain: Low-density now evident affecting the left corpus callosum and cingulate gyrus consistent with acute infarction. No hemorrhage. 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 No hydrocephalus. Vascular: There is atherosclerotic calcification of the major vessels at the base of the brain. Skull: Negative Sinuses: Clear Orbits: Negative Review of the MIP images confirms the above findings CTA NECK FINDINGS Aortic arch: Aortic atherosclerosis. No aneurysm or dissection. Common origin of the innominate artery and left common carotid artery. Right carotid system:  Common carotid artery widely patent to the bifurcation. Atherosclerotic disease at the carotid bifurcation, primarily calcified. Minimal diameter of the ICA is 3.5 mm. This indicates a 30% stenosis. Atherosclerotic disease at the skullbase results in narrowing to a diameter of 2.5 mm. This indicates a 50% stenosis. Left carotid system: Common carotid artery widely patent to the bifurcation. Atherosclerotic change in the ICA bulb, primarily calcified. Minimal diameter is 3.5 mm, consistent with 30% stenosis. Cervical ICA is widely patent beyond that. Vertebral arteries: 50% irregular stenosis of the proximal left subclavian artery. Atherosclerotic calcification at both vertebral arteries and proximal vertebral arteries. Detail limited by shoulder density. Stenosis estimated at 50-70% on both sides. Beyond that, the vessels show some atherosclerotic change in the cervical region but are patent to the foramen magnum. Severe stenosis of the distal left vertebral artery at the foramen magnum,  estimated at 80%. Skeleton: Ordinary spondylosis Other neck: No significant soft tissue lesion. Upper chest: Negative Review of the MIP images confirms the above findings CTA HEAD FINDINGS Anterior circulation: Internal carotid artery is patent through the siphon region. There is extensive atherosclerotic calcification in the carotid siphon region with stenosis estimated at 50-70%. Supraclinoid ICA is patent. There is flow in both the right middle cerebral artery and right anterior cerebral artery. Left ICA is patent through the siphon region. Again there is extensive calcification with stenosis estimated at 50-70%. Anterior and middle cerebral vessels are patent proximally. The left anterior cerebral artery is occluded or nearly occluded 1.5 cm beyond its origin. Some diminished flow beyond that could be collateral or reconstituted flow. Posterior circulation: Right vertebral artery is occluded after Pike up. As noted above, 80% stenosis of the left vertebral artery at the foramen magnum level. Beyond that, the vessel is small and irregular but does reach the basilar. No basilar stenosis. Superior cerebellar and posterior cerebral vessels show flow. Venous sinuses: Patent and normal Anatomic variants: None significant Delayed phase: No abnormal enhancement Review of the MIP images confirms the above findings IMPRESSION: Low-density now visible in the region of acute infarction affecting the left corpus callosum and cingulate gyrus. No hemorrhage. 50% irregular stenosis of the proximal left subclavian artery. 30% stenosis in the proximal ICA on both sides. 50% stenosis of the right cervical ICA just beneath the skullbase. Atherosclerotic disease at both proximal vertebral arteries worse on the right than the left. Stenoses in those regions estimated at 50-70% bilaterally. 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 Occlusion  or near occlusion of the left anterior cerebral artery 1.5 cm beyond its origin. There is some flow more distal to that within the left ACA, which could be reconstituted or collateral. 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 Posterior circulation branch vessels show flow. Electronically Signed   By: Nelson Chimes M.D.   On: 07/02/2016 20:35   Mr Brain Wo Contrast  Result Date: 07/01/2016 CLINICAL DATA:  66 y/o M; acute weakness, altered mental status, and multiple falls with concern for acute stroke. EXAM: MRI HEAD WITHOUT CONTRAST TECHNIQUE: Multiplanar, multiecho pulse sequences of the brain and surrounding structures were obtained without intravenous contrast. COMPARISON:  06/29/2016 CT head.  01/03/2008 MRI head. FINDINGS: Brain: Diffusion restriction within the left aspect of corpus callosum extending into left cingulate gyrus in paramedian frontal lobe compatible with acute/ early subacute infarction. 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 Chronic infarct within the left hemi pons. Moderate diffuse supratentorial and infratentorial brain parenchymal volume loss. Background of mild chronic microvascular ischemic changes in white matter. No hydrocephalus. No extra-axial collection. No abnormal susceptibility hypointensity to indicate intracranial hemorrhage. Vascular: Normal flow voids. Skull and upper cervical spine: Normal marrow signal. Sinuses/Orbits: Mild diffuse paranasal sinus mucosal thickening greatest in the maxillary sinuses. No abnormal signal of the mastoid air cells. Orbits are unremarkable. Other: None. IMPRESSION: 1. Diffusion restriction within the left aspect of corpus callosum extending into left cingulate gyrus in paramedian frontal lobe  compatible with acute/ early subacute infarction, left ACA distribution. No acute hemorrhage identified. 2. Mild chronic microvascular ischemic changes and moderate diffuse brain parenchymal volume loss. Chronic left pons infarct. 3. Mild diffuse paranasal sinus disease. These results will be called to the ordering clinician or representative by the Radiologist Assistant, and communication documented in the PACS or zVision Dashboard. Electronically Signed   By: Kristine Garbe M.D.   On: 07/01/2016 14:57     LOS: 3 days   Oren Binet, MD  [facility] Pager:[phone]  If 7PM-7AM, please contact night-coverage [email or web address] Password [email or web address] 07/03/2016, 11:10 AM

## 2016-07-03 NOTE — Consult Note (Signed)
Physical Medicine and Rehabilitation Consult Reason for Consult: Acute ischemic infarct left ACA territory Referring Physician: [facility]   HPI: [name] is a 66 y.o. 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 Per chart review lives alone. Walks with a quad cane. He uses [employer] for transportation. Daughter in the area question cyst. Presented 06/30/2016 with increasing lethargy over the past 3 days and multiple falls with altered mental status. Reports of diarrhea for the past few days, WBC 23,000, sodium 132. Cranial CT scan negative. Chest x-ray with no acute disease. C. difficile specimen negative. Protocol initiated for suspect sepsis. MRI showed diffusion restriction within the left aspect of corpus colostomy extending into the left cingulate gyrus in the paramedian left frontal lobe compatible with acute early subacute infarct, left ACA distribution. 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 Occlusion or near occlusion the left anterior cerebral artery 1.5 cm beyond its origin. Echocardiogram with ejection fraction of 123456 grade 2 diastolic dysfunction. No wall motion abnormalities. Neurology consult at present maintain on aspirin 325 mg daily as well as Plavix for CVA prophylaxis. Subcutaneous Lovenox for DVT prophylaxis. WBC trending down 15,700. Physical and occupational therapy evaluations completed. M.D. has requested physical medicine rehabilitation consult.   Review of Systems  Constitutional: Negative for chills and fever.  HENT: Negative for hearing loss and tinnitus.   Eyes: Negative for blurred vision and double vision.  Respiratory: Negative for cough and shortness of breath.   Cardiovascular: Positive for leg  swelling. Negative for chest pain and palpitations.  Gastrointestinal: Positive for constipation. Negative for nausea and vomiting.  Genitourinary: Negative for dysuria, flank pain and hematuria.  Musculoskeletal: Positive for falls and myalgias.  Skin: Negative for rash.  Neurological: Positive for speech change and weakness. Negative for seizures.  Psychiatric/Behavioral: Positive for depression.  All other systems reviewed and are negative.  Past Medical History:  Diagnosis Date  . 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 CVA (cerebral infarction) 01/03/2008   MRI: Acute 1 x 1.5 cm infarction affecting the left side of the pons.  . Diabetes mellitus   . DVT (deep venous thrombosis) ([place])   . Hypertension   . PAD (peripheral artery disease) ([place])   . Stroke [facility])    2008   Past Surgical History:  Procedure Laterality Date  . None     Family History  Problem Relation Age of Onset  . Diabetes Mother   . Heart attack Mother   . Hypertension Mother   . Diabetes Sister   . Hypertension Sister   . Hypertension Brother   . Hypertension Daughter   . Hypertension Son   . Colon cancer Neg Hx   . Esophageal cancer Neg Hx   . Stomach cancer Neg Hx   . Rectal cancer Neg Hx    Social History:  reports that he has never smoked. He has never used smokeless tobacco. He reports that he does not drink alcohol or use drugs. Allergies: No Known Allergies Facility-Administered Medications Prior to Admission  Medication Dose Route Frequency Provider Last Rate Last Dose  . 0.9 %  sodium chloride infusion  500 mL Intravenous Continuous Gatha Mayer, MD       Medications Prior to Admission  Medication Sig Dispense Refill  . aspirin 81 MG tablet Take 1 tablet (81 mg total) by mouth daily. (Patient taking differently: Take 81 mg by mouth every morning. ) 30 tablet 11  . atorvastatin (LIPITOR) 40 MG tablet Take 1 tablet (40 mg total) by mouth daily. (Patient taking differently: Take  40 mg by mouth every evening. ) 90 tablet 3  . B-D ULTRAFINE III SHORT PEN 31G X 8 MM MISC USE WITH LEVEMIR 100 each 0  . Blood Gluc Meter Disp-Strips (SIDEKICK BLOOD GLUCOSE SYSTEM) DEVI Use for daily testing 100 each 3  . carvedilol (COREG) 25 MG tablet TAKE 1 TABLET (25 MG TOTAL) BY MOUTH 2 (TWO) TIMES DAILY WITH A MEAL. 180 tablet 3  . clopidogrel (PLAVIX) 75 MG tablet TAKE 1 TABLET (75 MG TOTAL) BY MOUTH DAILY. (Patient taking differently: Take 75 mg by mouth every morning. ) 90 tablet 3  . hydrochlorothiazide (HYDRODIURIL) 25 MG tablet TAKE 1 TABLET (25 MG TOTAL) BY MOUTH DAILY. (Patient taking differently: TAKE 1 TABLET (25 MG TOTAL) BY MOUTH EVERY EVENING) 90 tablet 2  . insulin aspart (NOVOLOG FLEXPEN) 100 UNIT/ML FlexPen Inject 15 Units into the skin 2 (two) times daily with a meal. 3 pen 3  . Insulin Syringe-Needle U-100 28G X 1/2" 1 ML MISC 1 each by Does not apply route 3 (three) times daily. 100 each 11  . LEVEMIR FLEXTOUCH 100 UNIT/ML Pen INJECT 30 UNITS INTO THE SKIN DAILY 15 pen 5  . lisinopril (PRINIVIL,ZESTRIL) 40 MG tablet TAKE 1 TABLET (40 MG TOTAL) BY MOUTH DAILY. 90 tablet 3  . sertraline (ZOLOFT) 100 MG tablet TAKE 1 TABLET BY MOUTH EVERY DAY (Patient taking differently: TAKE 100 MG BY MOUTH EVERY MORNING) 90 tablet 1  . spironolactone (ALDACTONE) 50 MG tablet Take 1 tablet (50 mg total) by mouth daily. (Patient taking differently: Take 50 mg by mouth every morning. ) 90 tablet 1  . amLODipine (NORVASC) 10 MG tablet TAKE 1 TABLET (10 MG TOTAL) BY MOUTH DAILY. (Patient not taking: Reported on 06/30/2016) 90 tablet 3  . baclofen (LIORESAL) 10 MG tablet TAKE 1/2 TABLET BY MOUTH 1 time a DAY AS NEEDED FOR MUSCLE SPASMS (Patient not taking: Reported on 06/30/2016) 30 tablet 3  . sildenafil (REVATIO) 20 MG tablet Take 2-3 tablets (40-60 mg total) by mouth daily as needed (intercourse). (Patient not taking: Reported on 06/30/2016) 30 tablet 0    Home: [place]  expects to be discharged to:: Private residence Living Arrangements: Alone Available Help at Discharge: Family, Available PRN/intermittently (Patient reports family member could stay with him at d/c) Type of Home: House Home Access: Level entry Home Layout: One level Bathroom Shower/Tub: Tub/shower unit, Architectural technologist: Standard Bathroom Accessibility: Yes Home Equipment: Cane - quad, Bedside commode, Tub bench  Functional History: Prior Function Level of Independence: Needs assistance Gait / Transfers Assistance Needed: walks with quad cane without assist on flat surfaces ADL's / Homemaking Assistance Needed: Pt states he bathes and dresses himself, does simple cooking.  Family helps with cleaning and brings groceries.  Uses [employer] for transportation. Functional Status:  Mobility: Bed Mobility Overal bed mobility: Needs Assistance Bed Mobility: Rolling, Sidelying to Sit, Sit to Sidelying Rolling: Mod assist Sidelying to sit: Max assist, HOB elevated Supine to sit: Min guard, HOB elevated Sit to sidelying: Mod assist, HOB elevated General bed mobility comments: Patient rolls to Rt side using LUE to pull on bed rail.  Assist to bring trunk/LE's over.  Requires  max assist to move to sitting - unable to use RUE to assist with pushing up.  Once upright, patient able to maintain sitting balance with min guard assist.  Patient sat EOB x 12 minutes.  Assist to bring RLE onto bed to return to sidelying.  Required +2 max assist to scoot toward HOB in supine. Transfers Overall transfer level: Needs assistance Equipment used: Quad cane, 2 person hand held assist Transfers: Sit to/from Stand Sit to Stand: Min assist, +2 physical assistance General transfer comment: NT Ambulation/Gait Ambulation/Gait assistance: Min assist, +2 physical assistance, +2 safety/equipment Ambulation Distance (Feet): 30 Feet Assistive device: Quad cane Gait Pattern/deviations: Step-to pattern, Decreased step  length - right, Decreased stance time - right, Shuffle, Trunk flexed, Antalgic General Gait Details: Cues for QC placement and advancement of R LE.  Physical assist for balance, wt shift.  Advancement of R LE with hip hike, increased ER and circumduction to compensate for increased tone into extension and lack of knee flex/ankle DF in swing phase Gait velocity: decr    ADL: ADL Overall ADL's : Needs assistance/impaired Eating/Feeding: Set up, Sitting Grooming: Sitting, Minimal assistance Grooming Details (indicate cue type and reason): assist donning deodorant. Upper Body Bathing: Minimal assistance, Sitting Lower Body Bathing: Moderate assistance, Sit to/from stand Upper Body Dressing : Minimal assistance, Sitting Lower Body Dressing: Moderate assistance, Sit to/from stand Toilet Transfer: Minimal assistance, +2 for safety/equipment, BSC (quad cane) Toileting- Clothing Manipulation and Hygiene: Minimal assistance, Sit to/from stand, Sitting/lateral lean Functional mobility during ADLs: Minimal assistance, +2 for safety/equipment, Cueing for sequencing, Cueing for safety (quad cane) General ADL Comments: Pt overall not steady on feet and requires several cues for safety with quad cane when up on his feet.  Pt has difficulty reaching his feet.  Wears slip on bedroom shoes most of the time which may not be the best option given his RLE weakness.  Cognition: Cognition Overall Cognitive Status: Difficult to assess Orientation Level: Oriented X4 Cognition Arousal/Alertness: Awake/alert Behavior During Therapy: WFL for tasks assessed/performed, Flat affect Overall Cognitive Status: Difficult to assess General Comments: Pt does fairly well cognitively, alert and oriented following all commands.  Pt is slow to process and with decreased problem solving skillls.   Difficult to assess due to: Impaired communication  Blood pressure (!) 165/63, pulse (!) 50, temperature 98.7 F (37.1 C),  temperature source Oral, resp. rate 20, height 5\' 7"  (1.702 m), weight 111.1 kg (245 lb), SpO2 100 %. Physical Exam  Vitals reviewed. HENT:  Mild right facial droop  Eyes:  Pupils round and reactive to light  Neck: Normal range of motion. Neck supple. No thyromegaly present.  Cardiovascular:  Cardiac rate controlled  Respiratory: Effort normal and breath sounds normal. No respiratory distress.  GI: Soft. Bowel sounds are normal. He exhibits no distension.  Neurological:  Alert. Flat effect. Speech is dysarthric. He's aphasic. Can follow simple commands. Right central 7 and tongue deviation and poor oro-motor control. Spastic right hemiparesis ?1-2/5 UE and LE---did not initiate consistently. Tone MAS 2-3/4. LUE 4/5 prox to distal. LLE:  3-4/5 prox to distal.   Skin: Skin is warm and dry.    Results for orders placed or performed during the hospital encounter of 06/29/16 (from the past 24 hour(s))  Glucose, capillary     Status: Abnormal   Collection Time: 07/02/16  6:31 AM  Result Value Ref Range   Glucose-Capillary 123 (H) 65 - 99 mg/dL   Comment 1 Notify RN    Comment 2  Document in Chart   Glucose, capillary     Status: Abnormal   Collection Time: 07/02/16 11:27 AM  Result Value Ref Range   Glucose-Capillary 256 (H) 65 - 99 mg/dL  Glucose, capillary     Status: Abnormal   Collection Time: 07/02/16  4:37 PM  Result Value Ref Range   Glucose-Capillary 177 (H) 65 - 99 mg/dL  Glucose, capillary     Status: Abnormal   Collection Time: 07/02/16  9:04 PM  Result Value Ref Range   Glucose-Capillary 144 (H) 65 - 99 mg/dL   Comment 1 Notify RN    Comment 2 Document in Chart   CBC with Differential/Platelet     Status: Abnormal   Collection Time: 07/03/16  3:45 AM  Result Value Ref Range   WBC 11.4 (H) 4.0 - 10.5 K/uL   RBC 3.78 (L) 4.22 - 5.81 MIL/uL   Hemoglobin 11.2 (L) 13.0 - 17.0 g/dL   HCT 34.6 (L) 39.0 - 52.0 %   MCV 91.5 78.0 - 100.0 fL   MCH 29.6 26.0 - 34.0 pg   MCHC  32.4 30.0 - 36.0 g/dL   RDW 12.5 11.5 - 15.5 %   Platelets 148 (L) 150 - 400 K/uL   Neutrophils Relative % 68 %   Neutro Abs 7.7 1.7 - 7.7 K/uL   Lymphocytes Relative 23 %   Lymphs Abs 2.6 0.7 - 4.0 K/uL   Monocytes Relative 6 %   Monocytes Absolute 0.7 0.1 - 1.0 K/uL   Eosinophils Relative 3 %   Eosinophils Absolute 0.3 0.0 - 0.7 K/uL   Basophils Relative 0 %   Basophils Absolute 0.0 0.0 - 0.1 K/uL  Basic metabolic panel     Status: Abnormal   Collection Time: 07/03/16  3:45 AM  Result Value Ref Range   Sodium 138 135 - 145 mmol/L   Potassium 3.9 3.5 - 5.1 mmol/L   Chloride 107 101 - 111 mmol/L   CO2 22 22 - 32 mmol/L   Glucose, Bld 117 (H) 65 - 99 mg/dL   BUN 10 6 - 20 mg/dL   Creatinine, Ser 0.86 0.61 - 1.24 mg/dL   Calcium 8.4 (L) 8.9 - 10.3 mg/dL   GFR calc non Af Amer >60 >60 mL/min   GFR calc Af Amer >60 >60 mL/min   Anion gap 9 5 - 15   Ct Angio Head W Or Wo Contrast  Result Date: 07/02/2016 CLINICAL DATA:  Hypertension and right hemiparesis. Slurred speech. Lethargy. EXAM: CT ANGIOGRAPHY HEAD AND NECK TECHNIQUE: Multidetector CT imaging of the head and neck was performed using the standard protocol during bolus administration of intravenous contrast. Multiplanar CT image reconstructions and MIPs were obtained to evaluate the vascular anatomy. Carotid stenosis measurements (when applicable) are obtained utilizing NASCET criteria, using the distal internal carotid diameter as the denominator. CONTRAST:  100 cc Isovue 370 COMPARISON:  MRI same day FINDINGS: CT HEAD FINDINGS Brain: Low-density now evident affecting the left corpus callosum and cingulate gyrus consistent with acute infarction. No hemorrhage. 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 No hydrocephalus. Vascular: There is atherosclerotic calcification of the major vessels at the base of the brain. Skull: Negative Sinuses: Clear Orbits: Negative Review of the  MIP images confirms the above findings CTA NECK FINDINGS Aortic arch: Aortic atherosclerosis. No aneurysm or dissection. Common origin of the innominate artery and left common carotid artery. Right carotid system: Common carotid artery widely patent to  the bifurcation. Atherosclerotic disease at the carotid bifurcation, primarily calcified. Minimal diameter of the ICA is 3.5 mm. This indicates a 30% stenosis. Atherosclerotic disease at the skullbase results in narrowing to a diameter of 2.5 mm. This indicates a 50% stenosis. Left carotid system: Common carotid artery widely patent to the bifurcation. Atherosclerotic change in the ICA bulb, primarily calcified. Minimal diameter is 3.5 mm, consistent with 30% stenosis. Cervical ICA is widely patent beyond that. Vertebral arteries: 50% irregular stenosis of the proximal left subclavian artery. Atherosclerotic calcification at both vertebral arteries and proximal vertebral arteries. Detail limited by shoulder density. Stenosis estimated at 50-70% on both sides. Beyond that, the vessels show some atherosclerotic change in the cervical region but are patent to the foramen magnum. Severe stenosis of the distal left vertebral artery at the foramen magnum, estimated at 80%. Skeleton: Ordinary spondylosis Other neck: No significant soft tissue lesion. Upper chest: Negative Review of the MIP images confirms the above findings CTA HEAD FINDINGS Anterior circulation: Internal carotid artery is patent through the siphon region. There is extensive atherosclerotic calcification in the carotid siphon region with stenosis estimated at 50-70%. Supraclinoid ICA is patent. There is flow in both the right middle cerebral artery and right anterior cerebral artery. Left ICA is patent through the siphon region. Again there is extensive calcification with stenosis estimated at 50-70%. Anterior and middle cerebral vessels are patent proximally. The left anterior cerebral artery is occluded or  nearly occluded 1.5 cm beyond its origin. Some diminished flow beyond that could be collateral or reconstituted flow. Posterior circulation: Right vertebral artery is occluded after Pike up. As noted above, 80% stenosis of the left vertebral artery at the foramen magnum level. Beyond that, the vessel is small and irregular but does reach the basilar. No basilar stenosis. Superior cerebellar and posterior cerebral vessels show flow. Venous sinuses: Patent and normal Anatomic variants: None significant Delayed phase: No abnormal enhancement Review of the MIP images confirms the above findings IMPRESSION: Low-density now visible in the region of acute infarction affecting the left corpus callosum and cingulate gyrus. No hemorrhage. 50% irregular stenosis of the proximal left subclavian artery. 30% stenosis in the proximal ICA on both sides. 50% stenosis of the right cervical ICA just beneath the skullbase. Atherosclerotic disease at both proximal vertebral arteries worse on the right than the left. Stenoses in those regions estimated at 50-70% bilaterally. 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 There is some flow more distal to that within the left ACA, which could be reconstituted or collateral. 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 Posterior circulation branch vessels show flow. Electronically Signed   By: Nelson Chimes M.D.   On: 07/02/2016 20:35   Ct Angio Neck W Or Wo Contrast  Result Date: 07/02/2016 CLINICAL DATA:  Hypertension and right hemiparesis. Slurred speech. Lethargy. EXAM: CT ANGIOGRAPHY HEAD AND NECK TECHNIQUE: Multidetector CT imaging of the head and neck was performed using the standard  protocol during bolus administration of intravenous contrast. Multiplanar CT image reconstructions and MIPs were obtained to evaluate the vascular anatomy. Carotid stenosis measurements (when applicable) are obtained utilizing NASCET criteria, using the distal internal carotid diameter as the denominator. CONTRAST:  100 cc Isovue 370 COMPARISON:  MRI same day  FINDINGS: CT HEAD FINDINGS Brain: Low-density now evident affecting the left corpus callosum and cingulate gyrus consistent with acute infarction. No hemorrhage. 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 No hydrocephalus. Vascular: There is atherosclerotic calcification of the major vessels at the base of the brain. Skull: Negative Sinuses: Clear Orbits: Negative Review of the MIP images confirms the above findings CTA NECK FINDINGS Aortic arch: Aortic atherosclerosis. No aneurysm or dissection. Common origin of the innominate artery and left common carotid artery. Right carotid system: Common carotid artery widely patent to the bifurcation. Atherosclerotic disease at the carotid bifurcation, primarily calcified. Minimal diameter of the ICA is 3.5 mm. This indicates a 30% stenosis. Atherosclerotic disease at the skullbase results in narrowing to a diameter of 2.5 mm. This indicates a 50% stenosis. Left carotid system: Common carotid artery widely patent to the bifurcation. Atherosclerotic change in the ICA bulb, primarily calcified. Minimal diameter is 3.5 mm, consistent with 30% stenosis. Cervical ICA is widely patent beyond that. Vertebral arteries: 50% irregular stenosis of the proximal left subclavian artery. Atherosclerotic calcification at both vertebral arteries and proximal vertebral arteries. Detail limited by shoulder density. Stenosis estimated at 50-70% on both sides. Beyond that, the vessels show some atherosclerotic change in the cervical region but are patent to the foramen magnum.  Severe stenosis of the distal left vertebral artery at the foramen magnum, estimated at 80%. Skeleton: Ordinary spondylosis Other neck: No significant soft tissue lesion. Upper chest: Negative Review of the MIP images confirms the above findings CTA HEAD FINDINGS Anterior circulation: Internal carotid artery is patent through the siphon region. There is extensive atherosclerotic calcification in the carotid siphon region with stenosis estimated at 50-70%. Supraclinoid ICA is patent. There is flow in both the right middle cerebral artery and right anterior cerebral artery. Left ICA is patent through the siphon region. Again there is extensive calcification with stenosis estimated at 50-70%. Anterior and middle cerebral vessels are patent proximally. The left anterior cerebral artery is occluded or nearly occluded 1.5 cm beyond its origin. Some diminished flow beyond that could be collateral or reconstituted flow. Posterior circulation: Right vertebral artery is occluded after Pike up. As noted above, 80% stenosis of the left vertebral artery at the foramen magnum level. Beyond that, the vessel is small and irregular but does reach the basilar. No basilar stenosis. Superior cerebellar and posterior cerebral vessels show flow. Venous sinuses: Patent and normal Anatomic variants: None significant Delayed phase: No abnormal enhancement Review of the MIP images confirms the above findings IMPRESSION: Low-density now visible in the region of acute infarction affecting the left corpus callosum and cingulate gyrus. No hemorrhage. 50% irregular stenosis of the proximal left subclavian artery. 30% stenosis in the proximal ICA on both sides. 50% stenosis of the right cervical ICA just beneath the skullbase. Atherosclerotic disease at both proximal vertebral arteries worse on the right than the left. Stenoses in those regions estimated at 50-70% bilaterally. Advanced atherosclerotic disease in the carotid siphon regions  bilaterally. 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 There is some flow more distal to that within the left ACA, which could be reconstituted or collateral. 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 Posterior circulation branch vessels show flow. Electronically Signed   By: Elta Guadeloupe  Shogry M.D.   On: 07/02/2016 20:35   Mr Brain Wo Contrast  Result Date: 07/01/2016 CLINICAL DATA:  66 y/o M; acute weakness, altered mental status, and multiple falls with concern for acute stroke. EXAM: MRI HEAD WITHOUT CONTRAST TECHNIQUE: Multiplanar, multiecho pulse sequences of the brain and surrounding structures were obtained without intravenous contrast. COMPARISON:  06/29/2016 CT head.  01/03/2008 MRI head. FINDINGS: Brain: Diffusion restriction within the left aspect of corpus callosum extending into left cingulate gyrus in paramedian frontal lobe compatible with acute/ early subacute infarction. 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 Chronic infarct within the left hemi pons. Moderate diffuse supratentorial and infratentorial brain parenchymal volume loss. Background of mild chronic microvascular ischemic changes in white matter. No hydrocephalus. No extra-axial collection. No abnormal susceptibility hypointensity to indicate intracranial hemorrhage. Vascular: Normal flow voids. Skull and upper cervical spine: Normal marrow signal. Sinuses/Orbits: Mild diffuse paranasal sinus mucosal thickening greatest in the maxillary sinuses. No abnormal signal of the mastoid air cells. Orbits are unremarkable. Other: None. IMPRESSION: 1. Diffusion restriction within the left aspect of corpus  callosum extending into left cingulate gyrus in paramedian frontal lobe compatible with acute/ early subacute infarction, left ACA distribution. No acute hemorrhage identified. 2. Mild chronic microvascular ischemic changes and moderate diffuse brain parenchymal volume loss. Chronic left pons infarct. 3. Mild diffuse paranasal sinus disease. These results will be called to the ordering clinician or representative by the Radiologist Assistant, and communication documented in the PACS or zVision Dashboard. Electronically Signed   By: Kristine Garbe M.D.   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 Does the need for close, 24 hr/day medical supervision in concert with the patient's rehab needs make it unreasonable for this patient to be served in a less intensive setting? Yes 2. Co-Morbidities requiring supervision/potential complications: dm, htn, CKD/AKD 3. Due to bladder management, bowel management, safety, skin/wound care, disease management, medication administration, pain management and patient education, does the patient require 24 hr/day rehab nursing? Yes 4. Does the patient require coordinated care of a physician, rehab nurse, PT (1-2 hrs/day, 5 days/week), OT (1-2 hrs/day, 5 days/week) and SLP (1-2 hrs/day, 5 days/week) to address physical and functional deficits in the context of the above medical diagnosis(es)? Yes Addressing deficits in the following areas: balance, endurance, locomotion, strength, transferring, bowel/bladder control, bathing, dressing, feeding, grooming, toileting, cognition, speech, language, swallowing and psychosocial support 5. Can the patient actively participate in an intensive therapy program of at least 3 hrs of therapy per day at least 5 days per week? Yes 6. The potential for patient to make measurable gains while on inpatient rehab is good 7. Anticipated  functional outcomes upon discharge from inpatient rehab are min assist and mod assist  with PT, min assist and mod assist with OT, supervision and min assist with SLP. 8. Estimated rehab length of stay to reach the above functional goals is: 20-28 days 9. Does the patient have adequate social supports and living environment to accommodate these discharge functional goals? Potentially 10. Anticipated D/C setting: Home 11. Anticipated post D/C treatments: [place] therapy 12. Overall Rehab/Functional Prognosis: excellent  RECOMMENDATIONS: This patient's condition is appropriate for continued rehabilitative care in the following setting: CIR Patient has agreed to participate in recommended program. Yes and Potentially Note that insurance prior authorization may be required for reimbursement  for recommended care.  Comment: Rehab Admissions Coordinator to follow up.  Thanks,  Meredith Staggers, MD, Mellody Drown    Cathlyn Parsons., PA-C 07/03/2016

## 2016-07-03 NOTE — Clinical Social Work Note (Signed)
CSW spoke with daughter Janace Hoard by phone. CSW provided daughter bed offers by phone as no family at bedside. The daughter states she is not really wanting SNF for the patient but if he needs rehab outside of the hospital she would want him to go to [place] explained that the daughter would need to be available to complete paperwork at [facility] for the patient's admission. The daughter states that she works 12:15PM until 8PM and doesn't appear too eager to assist with paperwork. CSW reiterated that [facility] will not accept the patient unless paperwork can be completed. Handoff left for covering CSW.  Liz Beach MSW, [place], [place], [phone]

## 2016-07-03 NOTE — Progress Notes (Signed)
Rehab admissions - I am following for potential acute inpatient rehab admission.  I will contact family to discuss potential assist for patient after rehab.  I will follow up in am.  Call me for questions.  [phone]

## 2016-07-03 NOTE — Progress Notes (Signed)
Chaplain presented to the patient to provide Advance Directive forms to review prior to completion, and provide any assistance as needed. Patient instructed to inform the Nurse to call [facility] to have forms notarized. [place] [phone]

## 2016-07-04 ENCOUNTER — Inpatient Hospital Stay (HOSPITAL_COMMUNITY)
Admission: RE | Admit: 2016-07-04 | Discharge: 2016-07-18 | DRG: 057 | Disposition: A | Discharge status: Skilled Nursing Facility | Payer: Medicare Other | Source: Intra-hospital | Attending: Physical Medicine & Rehabilitation | Admitting: Physical Medicine & Rehabilitation

## 2016-07-04 DIAGNOSIS — F329 Major depressive disorder, single episode, unspecified: Secondary | ICD-10-CM | POA: Diagnosis not present

## 2016-07-04 DIAGNOSIS — E118 Type 2 diabetes mellitus with unspecified complications: Secondary | ICD-10-CM | POA: Diagnosis not present

## 2016-07-04 DIAGNOSIS — R296 Repeated falls: Secondary | ICD-10-CM | POA: Diagnosis not present

## 2016-07-04 DIAGNOSIS — I69322 Dysarthria following cerebral infarction: Secondary | ICD-10-CM

## 2016-07-04 DIAGNOSIS — I69320 Aphasia following cerebral infarction: Secondary | ICD-10-CM

## 2016-07-04 DIAGNOSIS — E1159 Type 2 diabetes mellitus with other circulatory complications: Secondary | ICD-10-CM | POA: Diagnosis not present

## 2016-07-04 DIAGNOSIS — E1122 Type 2 diabetes mellitus with diabetic chronic kidney disease: Secondary | ICD-10-CM | POA: Diagnosis not present

## 2016-07-04 DIAGNOSIS — Z6837 Body mass index (BMI) 37.0-37.9, adult: Secondary | ICD-10-CM | POA: Diagnosis not present

## 2016-07-04 DIAGNOSIS — E1143 Type 2 diabetes mellitus with diabetic autonomic (poly)neuropathy: Secondary | ICD-10-CM | POA: Diagnosis not present

## 2016-07-04 DIAGNOSIS — E669 Obesity, unspecified: Secondary | ICD-10-CM | POA: Diagnosis not present

## 2016-07-04 DIAGNOSIS — I639 Cerebral infarction, unspecified: Secondary | ICD-10-CM

## 2016-07-04 DIAGNOSIS — R32 Unspecified urinary incontinence: Secondary | ICD-10-CM | POA: Diagnosis not present

## 2016-07-04 DIAGNOSIS — E1151 Type 2 diabetes mellitus with diabetic peripheral angiopathy without gangrene: Secondary | ICD-10-CM | POA: Diagnosis not present

## 2016-07-04 DIAGNOSIS — Z7982 Long term (current) use of aspirin: Secondary | ICD-10-CM

## 2016-07-04 DIAGNOSIS — I679 Cerebrovascular disease, unspecified: Secondary | ICD-10-CM | POA: Diagnosis present

## 2016-07-04 DIAGNOSIS — Z86718 Personal history of other venous thrombosis and embolism: Secondary | ICD-10-CM

## 2016-07-04 DIAGNOSIS — R498 Other voice and resonance disorders: Secondary | ICD-10-CM | POA: Diagnosis not present

## 2016-07-04 DIAGNOSIS — G8110 Spastic hemiplegia affecting unspecified side: Secondary | ICD-10-CM

## 2016-07-04 DIAGNOSIS — R488 Other symbolic dysfunctions: Secondary | ICD-10-CM | POA: Diagnosis not present

## 2016-07-04 DIAGNOSIS — I10 Essential (primary) hypertension: Secondary | ICD-10-CM | POA: Diagnosis not present

## 2016-07-04 DIAGNOSIS — N182 Chronic kidney disease, stage 2 (mild): Secondary | ICD-10-CM

## 2016-07-04 DIAGNOSIS — M6281 Muscle weakness (generalized): Secondary | ICD-10-CM | POA: Diagnosis not present

## 2016-07-04 DIAGNOSIS — K9181 Other intraoperative complications of digestive system: Secondary | ICD-10-CM | POA: Diagnosis not present

## 2016-07-04 DIAGNOSIS — K5900 Constipation, unspecified: Secondary | ICD-10-CM | POA: Diagnosis not present

## 2016-07-04 DIAGNOSIS — E8809 Other disorders of plasma-protein metabolism, not elsewhere classified: Secondary | ICD-10-CM | POA: Diagnosis not present

## 2016-07-04 DIAGNOSIS — D72829 Elevated white blood cell count, unspecified: Secondary | ICD-10-CM

## 2016-07-04 DIAGNOSIS — Z9119 Patient's noncompliance with other medical treatment and regimen: Secondary | ICD-10-CM | POA: Diagnosis not present

## 2016-07-04 DIAGNOSIS — Z7902 Long term (current) use of antithrombotics/antiplatelets: Secondary | ICD-10-CM

## 2016-07-04 DIAGNOSIS — Z79899 Other long term (current) drug therapy: Secondary | ICD-10-CM | POA: Diagnosis not present

## 2016-07-04 DIAGNOSIS — E785 Hyperlipidemia, unspecified: Secondary | ICD-10-CM | POA: Diagnosis not present

## 2016-07-04 DIAGNOSIS — I129 Hypertensive chronic kidney disease with stage 1 through stage 4 chronic kidney disease, or unspecified chronic kidney disease: Secondary | ICD-10-CM | POA: Diagnosis not present

## 2016-07-04 DIAGNOSIS — E1142 Type 2 diabetes mellitus with diabetic polyneuropathy: Secondary | ICD-10-CM

## 2016-07-04 DIAGNOSIS — Z8673 Personal history of transient ischemic attack (TIA), and cerebral infarction without residual deficits: Secondary | ICD-10-CM

## 2016-07-04 DIAGNOSIS — E119 Type 2 diabetes mellitus without complications: Secondary | ICD-10-CM

## 2016-07-04 DIAGNOSIS — R531 Weakness: Secondary | ICD-10-CM | POA: Diagnosis not present

## 2016-07-04 DIAGNOSIS — Z794 Long term (current) use of insulin: Secondary | ICD-10-CM | POA: Diagnosis not present

## 2016-07-04 DIAGNOSIS — I69351 Hemiplegia and hemiparesis following cerebral infarction affecting right dominant side: Secondary | ICD-10-CM | POA: Diagnosis not present

## 2016-07-04 DIAGNOSIS — I672 Cerebral atherosclerosis: Secondary | ICD-10-CM | POA: Diagnosis not present

## 2016-07-04 DIAGNOSIS — I6789 Other cerebrovascular disease: Secondary | ICD-10-CM | POA: Diagnosis not present

## 2016-07-04 LAB — LIPID PANEL
Cholesterol: 91 mg/dL (ref 0–200)
HDL: 22 mg/dL — AB (ref >40)
LDL Cholesterol: 60 mg/dL (ref 0–99)
Total CHOL/HDL Ratio: 4.1 RATIO
Triglycerides: 43 mg/dL (ref <150)
VLDL: 9 mg/dL (ref 0–40)

## 2016-07-04 LAB — CBC
HEMATOCRIT: 36.9 % — AB (ref 39.0–52.0)
Hemoglobin: 12.1 g/dL — ABNORMAL LOW (ref 13.0–17.0)
MCH: 30 pg (ref 26.0–34.0)
MCHC: 32.8 g/dL (ref 30.0–36.0)
MCV: 91.3 fL (ref 78.0–100.0)
PLATELETS: 154 10*3/uL (ref 150–400)
RBC: 4.04 MIL/uL — AB (ref 4.22–5.81)
RDW: 12.2 % (ref 11.5–15.5)
WBC: 15.7 10*3/uL — ABNORMAL HIGH (ref 4.0–10.5)

## 2016-07-04 LAB — GLUCOSE, CAPILLARY
GLUCOSE-CAPILLARY: 102 mg/dL — AB (ref 65–99)
GLUCOSE-CAPILLARY: 129 mg/dL — AB (ref 65–99)
Glucose-Capillary: 138 mg/dL — ABNORMAL HIGH (ref 65–99)

## 2016-07-04 LAB — HEMOGLOBIN A1C
HEMOGLOBIN A1C: 6.9 % — AB (ref 4.8–5.6)
MEAN PLASMA GLUCOSE: 151 mg/dL

## 2016-07-04 LAB — CREATININE, SERUM
Creatinine, Ser: 0.99 mg/dL (ref 0.61–1.24)
GFR calc Af Amer: >60 mL/min (ref >60)
GFR calc non Af Amer: >60 mL/min (ref >60)

## 2016-07-04 MED ORDER — INSULIN DETEMIR 100 UNIT/ML ~~LOC~~ SOLN
30.0000 [IU] | Freq: Every day | SUBCUTANEOUS | Status: DC
  Administered 2016-07-05 – 2016-07-10 (×6): 30 [IU] via SUBCUTANEOUS
  Filled 2016-07-04 (×7): qty 0.3

## 2016-07-04 MED ORDER — SERTRALINE HCL 100 MG PO TABS
100.0000 mg | ORAL_TABLET | Freq: Every day | ORAL | Status: DC
  Administered 2016-07-05 – 2016-07-18 (×14): 100 mg via ORAL
  Filled 2016-07-04 (×14): qty 1

## 2016-07-04 MED ORDER — ONDANSETRON HCL 4 MG/2ML IJ SOLN: 4.0000 mg | Freq: Four times a day (QID) | INTRAMUSCULAR | Status: DC | PRN

## 2016-07-04 MED ORDER — ACETAMINOPHEN 325 MG PO TABS: 650.0000 mg | ORAL_TABLET | Freq: Four times a day (QID) | ORAL | Status: DC | PRN

## 2016-07-04 MED ORDER — ATORVASTATIN CALCIUM 40 MG PO TABS
40.0000 mg | ORAL_TABLET | Freq: Every evening | ORAL | Status: DC
  Administered 2016-07-04 – 2016-07-17 (×14): 40 mg via ORAL
  Filled 2016-07-04 (×15): qty 1

## 2016-07-04 MED ORDER — ASPIRIN 325 MG PO TABS: 325.0000 mg | ORAL_TABLET | Freq: Every day | ORAL | 0 refills | Status: DC

## 2016-07-04 MED ORDER — ASPIRIN 325 MG PO TABS
325.0000 mg | ORAL_TABLET | Freq: Every day | ORAL | Status: DC
  Administered 2016-07-05 – 2016-07-18 (×14): 325 mg via ORAL
  Filled 2016-07-04 (×14): qty 1

## 2016-07-04 MED ORDER — ACETAMINOPHEN 650 MG RE SUPP: 650.0000 mg | Freq: Four times a day (QID) | RECTAL | Status: DC | PRN

## 2016-07-04 MED ORDER — ONDANSETRON HCL 4 MG PO TABS: 4.0000 mg | ORAL_TABLET | Freq: Four times a day (QID) | ORAL | Status: DC | PRN

## 2016-07-04 MED ORDER — ENOXAPARIN SODIUM 40 MG/0.4ML ~~LOC~~ SOLN: 40.0000 mg | SUBCUTANEOUS | Status: DC

## 2016-07-04 MED ORDER — SORBITOL 70 % SOLN
30.0000 mL | Freq: Every day | Status: DC | PRN
  Administered 2016-07-06: 30 mL via ORAL
  Filled 2016-07-04: qty 30

## 2016-07-04 MED ORDER — CLOPIDOGREL BISULFATE 75 MG PO TABS
75.0000 mg | ORAL_TABLET | Freq: Every day | ORAL | Status: DC
  Administered 2016-07-05 – 2016-07-18 (×14): 75 mg via ORAL
  Filled 2016-07-04 (×13): qty 1

## 2016-07-04 MED ORDER — ENOXAPARIN SODIUM 40 MG/0.4ML ~~LOC~~ SOLN
40.0000 mg | SUBCUTANEOUS | Status: DC
  Administered 2016-07-05 – 2016-07-18 (×14): 40 mg via SUBCUTANEOUS
  Filled 2016-07-04 (×14): qty 0.4

## 2016-07-04 MED ORDER — INSULIN ASPART 100 UNIT/ML ~~LOC~~ SOLN
0.0000 [IU] | Freq: Every day | SUBCUTANEOUS | Status: DC
  Administered 2016-07-04: 2 [IU] via SUBCUTANEOUS
  Administered 2016-07-05: 3 [IU] via SUBCUTANEOUS
  Administered 2016-07-06: 2 [IU] via SUBCUTANEOUS
  Administered 2016-07-07: 3 [IU] via SUBCUTANEOUS
  Administered 2016-07-14 – 2016-07-17 (×2): 2 [IU] via SUBCUTANEOUS

## 2016-07-04 NOTE — Progress Notes (Signed)
Physical Therapy Treatment Patient Details Name: ATHANASIOS RINDLISBACHER MRN: [id number] DOB: 1950/07/12 Today's Date: 07/04/2016    History of Present Illness Pt is a 66 yo male admitted with lethargy, weakness and dehydration from a few days of loose bowel movements.  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  07/02/16:  Patient with worsening speech and weakness Rt side.  MRI showed acute infarct Lt ACA distribution.  Patient transferred to [facility].    PT Comments    Pt with improved functional mobility this session compared to last and able to get up to recliner with MOD of 2.  Pt's lunch had arrived and deferred gait, but feel he could have ambulated.  Pt scheduled to go to CIR today.  Follow Up Recommendations  CIR;Supervision for mobility/OOB     Equipment Recommendations       Recommendations for Other Services       Precautions / Restrictions Precautions Precautions: Fall Precaution Comments: Rt hemiparesis Restrictions Weight Bearing Restrictions: No    Mobility  Bed Mobility Overal bed mobility: Needs Assistance Bed Mobility: Supine to Sit     Supine to sit: Min guard;HOB elevated     General bed mobility comments: Pt able to get to EOB with min/guard with increased time  Transfers Overall transfer level: Needs assistance Equipment used: 2 person hand held assist Transfers: Sit to/from Stand Sit to Stand: Mod assist;+2 physical assistance Stand pivot transfers: Mod assist;+2 physical assistance       General transfer comment: Pt able to take small little steps with MOD A of 2 to recliner for lunch  Ambulation/Gait             General Gait Details: deferred. Lunch had arrived   [employer]    Modified Rankin (Stroke Patients Only) Modified Rankin (Stroke Patients Only) Pre-Morbid Rankin Score: Moderate disability Modified Rankin: Moderately severe disability     Balance Overall balance assessment:  Needs assistance Sitting-balance support: No upper extremity supported;Feet supported Sitting balance-Leahy Scale: Fair     Standing balance support: Single extremity supported Standing balance-Leahy Scale: Poor                      Cognition Arousal/Alertness: Awake/alert Behavior During Therapy: WFL for tasks assessed/performed;Flat affect Overall Cognitive Status: History of cognitive impairments - at baseline                      Exercises      General Comments        Pertinent Vitals/Pain Pain Assessment: No/denies pain    Home Living                      Prior Function            PT Goals (current goals can now be found in the care plan section) Acute Rehab PT Goals Patient Stated Goal: to get better PT Goal Formulation: With patient Time For Goal Achievement: 07/16/16 Potential to Achieve Goals: Good Progress towards PT goals: Progressing toward goals    Frequency    Min 3X/week      PT Plan Current plan remains appropriate    Co-evaluation             End of Session Equipment Utilized During Treatment: Gait belt Activity Tolerance: Patient tolerated treatment well Patient left: in chair;with call bell/phone  within reach;with nursing/sitter in room     Time: 1205-1215 PT Time Calculation (min) (ACUTE ONLY): 10 min  Charges:  $Therapeutic Activity: 8-22 mins                    G Codes:      Havilah Topor LUBECK 07/04/2016, 1:19 PM

## 2016-07-04 NOTE — Progress Notes (Signed)
Pt transferred to 4W10. Pt denies pain or discomfort. No noted distress.

## 2016-07-04 NOTE — Progress Notes (Addendum)
Rehab admissions - I spoke with patient.  He has trouble speaking with me.  He gave me permission to speak with his daughter.  I called [name] and she tells me that she will be up to visit within 30 minutes.  I will speak with daughter about rehab venue once she arrives.  Call me for questions.  #[phone]  Update:  I met with patient's daughter.  She is angry with lack of communication to her regarding patient's stroke and medical condition.  I called attending MD and gave him this message.  I talked with daughter about inpatient rehab venue and I gave her booklets.  Daughter has no plan yet regarding caregivers after rehab stay.  She is aware of the bed at [facility], but says that she has to work today.  Daughter is trying to think about CIR versus SNF.  She will let me know later.  I have told her that patient is medically ready for discharge today.  Call me for questions.  #[phone]

## 2016-07-04 NOTE — Progress Notes (Signed)
Received pt. As a transfer from 27 M.Pt. Was oriented to the unit protocol and routine.Safety plan was explained,fall prevention plan was ex[plained(pt. Unable to sign,waiting for daughter to sign tomorrow).

## 2016-07-04 NOTE — H&P (Signed)
Physical Medicine and Rehabilitation Admission H&P    Chief Complaint  Patient presents with  . Altered Mental Status  : HPI: [name] is a 66 y.o. 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 Per chart review lives alone. Walks with a quad cane. He uses [employer] for transportation. Daughter in the area question cyst. Presented 06/30/2016 with increasing lethargy over the past 3 days and multiple falls with altered mental status. Reports of diarrhea for the past few days, WBC 23,000, sodium 132. Cranial CT scan negative. Chest x-ray with no acute disease. C. difficile specimen negative. Protocol initiated for suspect sepsis. MRI showed diffusion restriction within the left aspect of corpus colostomy extending into the left cingulate gyrus in the paramedian left frontal lobe compatible with acute early subacute infarct, left ACA distribution. 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 Occlusion or near occlusion the left anterior cerebral artery 1.5 cm beyond its origin. Echocardiogram with ejection fraction of 76% grade 2 diastolic dysfunction. No wall motion abnormalities. Neurology consult at present maintain on aspirin 325 mg daily as well as Plavix for CVA prophylaxis3 months then Plavix alone. Subcutaneous Lovenox for DVT prophylaxis. WBC trending down 15,700. Tolerating a regular consistency diet. Physical and occupational therapy evaluations completed. M.D. has requested physical medicine rehabilitation consult.Patient was admitted for a comprehensive rehabilitation program  Review of Systems  Constitutional: Negative for chills and fever.  HENT: Negative for hearing loss and tinnitus.   Eyes: Negative for blurred vision and  double vision.  Respiratory: Negative for cough and shortness of breath.   Cardiovascular: Negative for chest pain and palpitations.  Gastrointestinal: Positive for constipation. Negative for nausea and vomiting.  Genitourinary: Positive for urgency. Negative for dysuria and hematuria.  Musculoskeletal: Positive for falls and myalgias.  Skin: Negative for rash.  Neurological: Positive for speech change and weakness. Negative for seizures.  All other systems reviewed and are negative.  Past Medical History:  Diagnosis Date  . 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 CVA (cerebral infarction) 01/03/2008   MRI: Acute 1 x 1.5 cm infarction affecting the left side of the pons.  . Diabetes mellitus   . DVT (deep venous thrombosis) ([place])   . Hypertension   . PAD (peripheral artery disease) ([place])   . Stroke [facility])    2008   Past Surgical History:  Procedure Laterality Date  . None     Family History  Problem Relation Age of Onset  . Diabetes Mother   . Heart attack Mother   . Hypertension Mother   . Diabetes Sister   . Hypertension Sister   . Hypertension Brother   . Hypertension Daughter   . Hypertension Son   . Colon cancer Neg Hx   . Esophageal cancer Neg Hx   . Stomach cancer Neg Hx   . Rectal cancer Neg Hx    Social History:  reports that he has never smoked. He has never used smokeless tobacco. He reports that he does not drink alcohol or use drugs. Allergies: No Known Allergies Facility-Administered Medications Prior to Admission  Medication Dose Route Frequency Provider Last Rate Last Dose  . 0.9 %  sodium chloride infusion  500 mL Intravenous Continuous Gatha Mayer, MD  Medications Prior to Admission  Medication Sig Dispense Refill  . aspirin 81 MG tablet Take 1 tablet (81 mg total) by mouth daily. (Patient taking differently: Take 81 mg by mouth every morning. ) 30 tablet 11  . atorvastatin (LIPITOR) 40 MG tablet Take 1 tablet (40 mg total) by  mouth daily. (Patient taking differently: Take 40 mg by mouth every evening. ) 90 tablet 3  . B-D ULTRAFINE III SHORT PEN 31G X 8 MM MISC USE WITH LEVEMIR 100 each 0  . Blood Gluc Meter Disp-Strips (SIDEKICK BLOOD GLUCOSE SYSTEM) DEVI Use for daily testing 100 each 3  . carvedilol (COREG) 25 MG tablet TAKE 1 TABLET (25 MG TOTAL) BY MOUTH 2 (TWO) TIMES DAILY WITH A MEAL. 180 tablet 3  . clopidogrel (PLAVIX) 75 MG tablet TAKE 1 TABLET (75 MG TOTAL) BY MOUTH DAILY. (Patient taking differently: Take 75 mg by mouth every morning. ) 90 tablet 3  . hydrochlorothiazide (HYDRODIURIL) 25 MG tablet TAKE 1 TABLET (25 MG TOTAL) BY MOUTH DAILY. (Patient taking differently: TAKE 1 TABLET (25 MG TOTAL) BY MOUTH EVERY EVENING) 90 tablet 2  . insulin aspart (NOVOLOG FLEXPEN) 100 UNIT/ML FlexPen Inject 15 Units into the skin 2 (two) times daily with a meal. 3 pen 3  . Insulin Syringe-Needle U-100 28G X 1/2" 1 ML MISC 1 each by Does not apply route 3 (three) times daily. 100 each 11  . LEVEMIR FLEXTOUCH 100 UNIT/ML Pen INJECT 30 UNITS INTO THE SKIN DAILY 15 pen 5  . lisinopril (PRINIVIL,ZESTRIL) 40 MG tablet TAKE 1 TABLET (40 MG TOTAL) BY MOUTH DAILY. 90 tablet 3  . sertraline (ZOLOFT) 100 MG tablet TAKE 1 TABLET BY MOUTH EVERY DAY (Patient taking differently: TAKE 100 MG BY MOUTH EVERY MORNING) 90 tablet 1  . spironolactone (ALDACTONE) 50 MG tablet Take 1 tablet (50 mg total) by mouth daily. (Patient taking differently: Take 50 mg by mouth every morning. ) 90 tablet 1  . amLODipine (NORVASC) 10 MG tablet TAKE 1 TABLET (10 MG TOTAL) BY MOUTH DAILY. (Patient not taking: Reported on 06/30/2016) 90 tablet 3  . baclofen (LIORESAL) 10 MG tablet TAKE 1/2 TABLET BY MOUTH 1 time a DAY AS NEEDED FOR MUSCLE SPASMS (Patient not taking: Reported on 06/30/2016) 30 tablet 3  . sildenafil (REVATIO) 20 MG tablet Take 2-3 tablets (40-60 mg total) by mouth daily as needed (intercourse). (Patient not taking: Reported on 06/30/2016) 30 tablet 0     Home: Home Living Family/patient expects to be discharged to:: Private residence Living Arrangements: Alone Available Help at Discharge: Family, Available PRN/intermittently Type of Home: House Home Access: Level entry Home Layout: One level Bathroom Shower/Tub: Tub/shower unit, Architectural technologist: Standard Bathroom Accessibility: Yes Home Equipment: Cane - quad, Bedside commode, Tub bench  Lives With: Alone, Other (Comment) (family that assists PRN)   Functional History: Prior Function Level of Independence: Needs assistance Gait / Transfers Assistance Needed: walks with quad cane without assist on flat surfaces ADL's / Homemaking Assistance Needed: Pt states he bathes and dresses himself, does simple cooking.  Family helps with cleaning and brings groceries.  Uses [employer] for transportation.  Functional Status:  Mobility: Bed Mobility Overal bed mobility: Needs Assistance Bed Mobility: Rolling, Sidelying to Sit, Sit to Sidelying Rolling: Mod assist Sidelying to sit: Max assist, HOB elevated Supine to sit: Min guard, HOB elevated Sit to sidelying: Mod assist, HOB elevated General bed mobility comments: Patient rolls to Rt side using LUE to pull on bed rail.  Assist to bring trunk/LE's over.  Requires max assist to move to sitting - unable to use RUE to assist with pushing up.  Once upright, patient able to maintain sitting balance with min guard assist.  Patient sat EOB x 12 minutes.  Assist to bring RLE onto bed to return to sidelying.  Required +2 max assist to scoot toward HOB in supine. Transfers Overall transfer level: Needs assistance Equipment used: Quad cane, 2 person hand held assist Transfers: Sit to/from Stand Sit to Stand: Min assist, +2 physical assistance General transfer comment: NT Ambulation/Gait Ambulation/Gait assistance: Min assist, +2 physical assistance, +2 safety/equipment Ambulation Distance (Feet): 30 Feet Assistive device: Quad cane Gait  Pattern/deviations: Step-to pattern, Decreased step length - right, Decreased stance time - right, Shuffle, Trunk flexed, Antalgic General Gait Details: Cues for QC placement and advancement of R LE.  Physical assist for balance, wt shift.  Advancement of R LE with hip hike, increased ER and circumduction to compensate for increased tone into extension and lack of knee flex/ankle DF in swing phase Gait velocity: decr    ADL: ADL Overall ADL's : Needs assistance/impaired Eating/Feeding: Set up, Sitting Grooming: Sitting, Minimal assistance Grooming Details (indicate cue type and reason): assist donning deodorant. Upper Body Bathing: Minimal assistance, Sitting Lower Body Bathing: Moderate assistance, Sit to/from stand Upper Body Dressing : Minimal assistance, Sitting Lower Body Dressing: Moderate assistance, Sit to/from stand Toilet Transfer: Minimal assistance, +2 for safety/equipment, BSC (quad cane) Toileting- Clothing Manipulation and Hygiene: Minimal assistance, Sit to/from stand, Sitting/lateral lean Functional mobility during ADLs: Minimal assistance, +2 for safety/equipment, Cueing for sequencing, Cueing for safety (quad cane) General ADL Comments: Pt overall not steady on feet and requires several cues for safety with quad cane when up on his feet.  Pt has difficulty reaching his feet.  Wears slip on bedroom shoes most of the time which may not be the best option given his RLE weakness.  Cognition: Cognition Overall Cognitive Status: History of cognitive impairments - at baseline Orientation Level: Oriented X4 Cognition Arousal/Alertness: Awake/alert Behavior During Therapy: WFL for tasks assessed/performed, Flat affect Overall Cognitive Status: History of cognitive impairments - at baseline General Comments: Pt does fairly well cognitively, alert and oriented following all commands.  Pt is slow to process and with decreased problem solving skillls.   Difficult to assess due to:  Impaired communication  Physical Exam: Blood pressure (!) 144/60, pulse (!) 58, temperature 98.6 F (37 C), temperature source Oral, resp. rate 20, height 5' 7"  (1.702 m), weight 111.1 kg (245 lb), SpO2 99 %. Physical Exam  Constitutional:  Obese   HENT:  Head: Normocephalic and atraumatic.     Eyes: Pupils are equal, round, and reactive to light. Left eye exhibits no discharge.  Pupils reactive to light  Neck: Normal range of motion. Neck supple. No JVD present. No tracheal deviation present. No thyromegaly present.  Cardiovascular: Normal rate and regular rhythm.  Exam reveals no friction rub.   No murmur heard. Respiratory: Effort normal and breath sounds normal. No respiratory distress. He has no wheezes. He has no rales. He exhibits no tenderness.  GI: Soft. Bowel sounds are normal. He exhibits no distension. There is no tenderness. There is no rebound.  Musculoskeletal: He exhibits edema (right lower extremity).  Skin .Warm and dry  Neurological:  More alert. Interacts/engages more. Speech remains dysarthric. Expressive aphasia, using more short phrases today to communicate. Can follow simple commands. Right central 7 and tongue deviation. Demonstrates improved oro-motor control. Spastic  right hemiparesis, 1 to 2-/5 RUE and RLE. Tone MAS 2-3/4 RUE. LUE 4/5 prox to distal. LLE:  3-4/5 prox to distal Psych: pleasant and cooperative  Results for orders placed or performed during the hospital encounter of 06/29/16 (from the past 48 hour(s))  Glucose, capillary     Status: Abnormal   Collection Time: 07/02/16 11:27 AM  Result Value Ref Range   Glucose-Capillary 256 (H) 65 - 99 mg/dL  Glucose, capillary     Status: Abnormal   Collection Time: 07/02/16  4:37 PM  Result Value Ref Range   Glucose-Capillary 177 (H) 65 - 99 mg/dL  Glucose, capillary     Status: Abnormal   Collection Time: 07/02/16  9:04 PM  Result Value Ref Range   Glucose-Capillary 144 (H) 65 - 99 mg/dL   Comment 1  Notify RN    Comment 2 Document in Chart   CBC with Differential/Platelet     Status: Abnormal   Collection Time: 07/03/16  3:45 AM  Result Value Ref Range   WBC 11.4 (H) 4.0 - 10.5 K/uL   RBC 3.78 (L) 4.22 - 5.81 MIL/uL   Hemoglobin 11.2 (L) 13.0 - 17.0 g/dL   HCT 34.6 (L) 39.0 - 52.0 %   MCV 91.5 78.0 - 100.0 fL   MCH 29.6 26.0 - 34.0 pg   MCHC 32.4 30.0 - 36.0 g/dL   RDW 12.5 11.5 - 15.5 %   Platelets 148 (L) 150 - 400 K/uL   Neutrophils Relative % 68 %   Neutro Abs 7.7 1.7 - 7.7 K/uL   Lymphocytes Relative 23 %   Lymphs Abs 2.6 0.7 - 4.0 K/uL   Monocytes Relative 6 %   Monocytes Absolute 0.7 0.1 - 1.0 K/uL   Eosinophils Relative 3 %   Eosinophils Absolute 0.3 0.0 - 0.7 K/uL   Basophils Relative 0 %   Basophils Absolute 0.0 0.0 - 0.1 K/uL  Basic metabolic panel     Status: Abnormal   Collection Time: 07/03/16  3:45 AM  Result Value Ref Range   Sodium 138 135 - 145 mmol/L   Potassium 3.9 3.5 - 5.1 mmol/L   Chloride 107 101 - 111 mmol/L   CO2 22 22 - 32 mmol/L   Glucose, Bld 117 (H) 65 - 99 mg/dL   BUN 10 6 - 20 mg/dL   Creatinine, Ser 0.86 0.61 - 1.24 mg/dL   Calcium 8.4 (L) 8.9 - 10.3 mg/dL   GFR calc non Af Amer >60 >60 mL/min   GFR calc Af Amer >60 >60 mL/min    Comment: (NOTE) The eGFR has been calculated using the CKD EPI equation. This calculation has not been validated in all clinical situations. eGFR's persistently <60 mL/min signify possible Chronic Kidney Disease.    Anion gap 9 5 - 15  Glucose, capillary     Status: None   Collection Time: 07/03/16  6:20 AM  Result Value Ref Range   Glucose-Capillary 91 65 - 99 mg/dL   Comment 1 Notify RN    Comment 2 Document in Chart   Hemoglobin A1c     Status: Abnormal   Collection Time: 07/03/16  7:52 AM  Result Value Ref Range   Hgb A1c MFr Bld 6.9 (H) 4.8 - 5.6 %    Comment: (NOTE)         Pre-diabetes: 5.7 - 6.4         Diabetes: >6.4         Glycemic control for adults  with diabetes: <7.0    Mean Plasma  Glucose 151 mg/dL    Comment: (NOTE) Performed At: [facility] [place], [place] [id number] Lindon Romp MD [id number]   Glucose, capillary     Status: Abnormal   Collection Time: 07/03/16 11:20 AM  Result Value Ref Range   Glucose-Capillary 185 (H) 65 - 99 mg/dL   Comment 1 Notify RN    Comment 2 Document in Chart   Glucose, capillary     Status: Abnormal   Collection Time: 07/03/16  4:25 PM  Result Value Ref Range   Glucose-Capillary 194 (H) 65 - 99 mg/dL  Glucose, capillary     Status: Abnormal   Collection Time: 07/03/16  9:25 PM  Result Value Ref Range   Glucose-Capillary 224 (H) 65 - 99 mg/dL   Comment 1 Notify RN    Comment 2 Document in Chart   Lipid panel     Status: Abnormal   Collection Time: 07/04/16  3:22 AM  Result Value Ref Range   Cholesterol 91 0 - 200 mg/dL   Triglycerides 43 <150 mg/dL   HDL 22 (L) >40 mg/dL   Total CHOL/HDL Ratio 4.1 RATIO   VLDL 9 0 - 40 mg/dL   LDL Cholesterol 60 0 - 99 mg/dL    Comment:        Total Cholesterol/HDL:CHD Risk Coronary Heart Disease Risk Table                     Men   Women  1/2 Average Risk   3.4   3.3  Average Risk       5.0   4.4  2 X Average Risk   9.6   7.1  3 X Average Risk  23.4   11.0        Use the calculated Patient Ratio above and the CHD Risk Table to determine the patient's CHD Risk.        ATP III CLASSIFICATION (LDL):  <100     mg/dL   Optimal  100-129  mg/dL   Near or Above                    Optimal  130-159  mg/dL   Borderline  160-189  mg/dL   High  >190     mg/dL   Very High   Glucose, capillary     Status: Abnormal   Collection Time: 07/04/16  6:37 AM  Result Value Ref Range   Glucose-Capillary 102 (H) 65 - 99 mg/dL   Comment 1 Notify RN    Comment 2 Document in Chart    Ct Angio Head W Or Wo Contrast  Result Date: 07/02/2016 CLINICAL DATA:  Hypertension and right hemiparesis. Slurred speech. Lethargy. EXAM: CT ANGIOGRAPHY HEAD AND NECK TECHNIQUE:  Multidetector CT imaging of the head and neck was performed using the standard protocol during bolus administration of intravenous contrast. Multiplanar CT image reconstructions and MIPs were obtained to evaluate the vascular anatomy. Carotid stenosis measurements (when applicable) are obtained utilizing NASCET criteria, using the distal internal carotid diameter as the denominator. CONTRAST:  100 cc Isovue 370 COMPARISON:  MRI same day FINDINGS: CT HEAD FINDINGS Brain: Low-density now evident affecting the left corpus callosum and cingulate gyrus consistent with acute infarction. No hemorrhage. 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 No hydrocephalus. Vascular: There is atherosclerotic calcification of the major vessels at the  base of the brain. Skull: Negative Sinuses: Clear Orbits: Negative Review of the MIP images confirms the above findings CTA NECK FINDINGS Aortic arch: Aortic atherosclerosis. No aneurysm or dissection. Common origin of the innominate artery and left common carotid artery. Right carotid system: Common carotid artery widely patent to the bifurcation. Atherosclerotic disease at the carotid bifurcation, primarily calcified. Minimal diameter of the ICA is 3.5 mm. This indicates a 30% stenosis. Atherosclerotic disease at the skullbase results in narrowing to a diameter of 2.5 mm. This indicates a 50% stenosis. Left carotid system: Common carotid artery widely patent to the bifurcation. Atherosclerotic change in the ICA bulb, primarily calcified. Minimal diameter is 3.5 mm, consistent with 30% stenosis. Cervical ICA is widely patent beyond that. Vertebral arteries: 50% irregular stenosis of the proximal left subclavian artery. Atherosclerotic calcification at both vertebral arteries and proximal vertebral arteries. Detail limited by shoulder density. Stenosis estimated at 50-70% on both sides. Beyond that, the vessels show some  atherosclerotic change in the cervical region but are patent to the foramen magnum. Severe stenosis of the distal left vertebral artery at the foramen magnum, estimated at 80%. Skeleton: Ordinary spondylosis Other neck: No significant soft tissue lesion. Upper chest: Negative Review of the MIP images confirms the above findings CTA HEAD FINDINGS Anterior circulation: Internal carotid artery is patent through the siphon region. There is extensive atherosclerotic calcification in the carotid siphon region with stenosis estimated at 50-70%. Supraclinoid ICA is patent. There is flow in both the right middle cerebral artery and right anterior cerebral artery. Left ICA is patent through the siphon region. Again there is extensive calcification with stenosis estimated at 50-70%. Anterior and middle cerebral vessels are patent proximally. The left anterior cerebral artery is occluded or nearly occluded 1.5 cm beyond its origin. Some diminished flow beyond that could be collateral or reconstituted flow. Posterior circulation: Right vertebral artery is occluded after Pike up. As noted above, 80% stenosis of the left vertebral artery at the foramen magnum level. Beyond that, the vessel is small and irregular but does reach the basilar. No basilar stenosis. Superior cerebellar and posterior cerebral vessels show flow. Venous sinuses: Patent and normal Anatomic variants: None significant Delayed phase: No abnormal enhancement Review of the MIP images confirms the above findings IMPRESSION: Low-density now visible in the region of acute infarction affecting the left corpus callosum and cingulate gyrus. No hemorrhage. 50% irregular stenosis of the proximal left subclavian artery. 30% stenosis in the proximal ICA on both sides. 50% stenosis of the right cervical ICA just beneath the skullbase. Atherosclerotic disease at both proximal vertebral arteries worse on the right than the left. Stenoses in those regions estimated at 50-70%  bilaterally. 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 There is some flow more distal to that within the left ACA, which could be reconstituted or collateral. 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 Posterior circulation branch vessels show flow. Electronically Signed   By: Nelson Chimes M.D.   On: 07/02/2016 20:35   Ct Angio Neck W Or Wo Contrast  Result Date: 07/02/2016 CLINICAL DATA:  Hypertension and right hemiparesis. Slurred speech. Lethargy. EXAM: CT ANGIOGRAPHY HEAD AND NECK TECHNIQUE: Multidetector CT imaging of the head and neck was performed using the standard  protocol during bolus administration of intravenous contrast. Multiplanar CT image reconstructions and MIPs were obtained to evaluate the vascular anatomy. Carotid stenosis measurements (when applicable) are obtained utilizing NASCET criteria, using the distal internal carotid diameter as the denominator. CONTRAST:  100 cc Isovue 370 COMPARISON:  MRI same day FINDINGS: CT HEAD FINDINGS Brain: Low-density now evident affecting the left corpus callosum and cingulate gyrus consistent with acute infarction. No hemorrhage. 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 No hydrocephalus. Vascular: There is atherosclerotic calcification of the major vessels at the base of the brain. Skull: Negative Sinuses: Clear Orbits: Negative Review of the MIP images confirms the above findings CTA NECK FINDINGS Aortic arch: Aortic atherosclerosis. No aneurysm or dissection. Common origin of the innominate artery and left common carotid artery. Right carotid system: Common carotid artery widely patent to the  bifurcation. Atherosclerotic disease at the carotid bifurcation, primarily calcified. Minimal diameter of the ICA is 3.5 mm. This indicates a 30% stenosis. Atherosclerotic disease at the skullbase results in narrowing to a diameter of 2.5 mm. This indicates a 50% stenosis. Left carotid system: Common carotid artery widely patent to the bifurcation. Atherosclerotic change in the ICA bulb, primarily calcified. Minimal diameter is 3.5 mm, consistent with 30% stenosis. Cervical ICA is widely patent beyond that. Vertebral arteries: 50% irregular stenosis of the proximal left subclavian artery. Atherosclerotic calcification at both vertebral arteries and proximal vertebral arteries. Detail limited by shoulder density. Stenosis estimated at 50-70% on both sides. Beyond that, the vessels show some atherosclerotic change in the cervical region but are patent to the foramen magnum. Severe stenosis of the distal left vertebral artery at the foramen magnum, estimated at 80%. Skeleton: Ordinary spondylosis Other neck: No significant soft tissue lesion. Upper chest: Negative Review of the MIP images confirms the above findings CTA HEAD FINDINGS Anterior circulation: Internal carotid artery is patent through the siphon region. There is extensive atherosclerotic calcification in the carotid siphon region with stenosis estimated at 50-70%. Supraclinoid ICA is patent. There is flow in both the right middle cerebral artery and right anterior cerebral artery. Left ICA is patent through the siphon region. Again there is extensive calcification with stenosis estimated at 50-70%. Anterior and middle cerebral vessels are patent proximally. The left anterior cerebral artery is occluded or nearly occluded 1.5 cm beyond its origin. Some diminished flow beyond that could be collateral or reconstituted flow. Posterior circulation: Right vertebral artery is occluded after Pike up. As noted above, 80% stenosis of the left vertebral artery at the  foramen magnum level. Beyond that, the vessel is small and irregular but does reach the basilar. No basilar stenosis. Superior cerebellar and posterior cerebral vessels show flow. Venous sinuses: Patent and normal Anatomic variants: None significant Delayed phase: No abnormal enhancement Review of the MIP images confirms the above findings IMPRESSION: Low-density now visible in the region of acute infarction affecting the left corpus callosum and cingulate gyrus. No hemorrhage. 50% irregular stenosis of the proximal left subclavian artery. 30% stenosis in the proximal ICA on both sides. 50% stenosis of the right cervical ICA just beneath the skullbase. Atherosclerotic disease at both proximal vertebral arteries worse on the right than the left. Stenoses in those regions estimated at 50-70% bilaterally. 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 There is some flow more distal to  that within the left ACA, which could be reconstituted or collateral. 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 Posterior circulation branch vessels show flow. Electronically Signed   By: Nelson Chimes M.D.   On: 07/02/2016 20:35       Medical Problem List and Plan: 1.  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  DVT Prophylaxis/Anticoagulation: Subcutaneous Lovenox. Monitor platelet counts and any signs of bleeding 3. Pain Management: Tylenol as needed  -consider baclofen trial for spasticity 4. Mood: Zoloft 100 mg daily 5. Neuropsych: This patient is capable of making  decisions on his own behalf. 6. Skin/Wound Care: Routine skin checks 7. Fluids/Electrolytes/Nutrition: Routine I&O with follow-up chemistries 8. Diabetes mellitus of peripheral neuropathy. Hemoglobin A1c 6.9. Levemir 30 units daily. Check blood sugars before meals and at bedtime. Diabetic teaching 9. CRI stage II. Follow-up chemistries upon admit. 10. Hypertension. Permissive hypertension. Patient on Coreg 25 mg twice a day, HCTZ 25 mg daily, lisinopril 40 mg daily, Aldactone 50 mg daily, Norvasc 10 mg daily, Revatio 60 mg daily prior to admission. Resume as needed 11. Hyperlipidemia. Lipitor 12. Question medical compliance. Provide education  Post Admission Physician Evaluation: 1. Functional deficits secondary  to left ACA infarct. 2. Patient is admitted to receive collaborative, interdisciplinary care between the physiatrist, rehab nursing staff, and therapy team. 3. Patient's level of medical complexity and substantial therapy needs in context of that medical necessity cannot be provided at a lesser intensity of care such as a SNF. 4. Patient has experienced substantial functional loss from his/her baseline which was documented above under the "Functional History" and "Functional Status" headings.  Judging by the patient's diagnosis, physical exam, and functional history, the patient has potential for functional progress which will result in measurable gains while on inpatient rehab.  These gains will be of substantial and practical use upon discharge  in facilitating mobility and self-care at the household level. 5. Physiatrist will provide 24 hour management of medical needs as well as oversight of the therapy plan/treatment and provide guidance as appropriate regarding the interaction of the two. 6. The Preadmission Screening has been reviewed and patient status is unchanged unless otherwise stated above. 7. 24 hour rehab nursing will assist with bladder management, bowel management, safety,  skin/wound care, disease management, medication administration, pain management and patient education  and help integrate therapy concepts, techniques,education, etc. 8. PT will assess and treat for/with: Lower extremity strength, range of motion, stamina, balance, functional mobility, safety, adaptive techniques and equipment, NMR.   Goals are: supervision to min assist. 9. OT will assess and treat for/with: ADL's, functional mobility, safety, upper extremity strength, adaptive techniques and equipment, NMR, family ed.   Goals are: supervision to min assist. Therapy may proceed with showering this patient. 10. SLP will assess and treat for/with: cognition, language, communication.  Goals are: supervision to min assist. 11. Case Management and Social Worker will assess and treat for psychological issues and discharge planning. 12. Team conference will be held weekly to assess progress toward goals and to determine barriers to discharge. 13. Patient will receive at least 3 hours of therapy per day at least 5 days per week. 14. ELOS: 20-25 days       15. Prognosis:  excellent  Meredith Staggers, MD, [facility] 07/04/2016  Cathlyn Parsons., PA-C 07/04/2016

## 2016-07-04 NOTE — Care Management Important Message (Signed)
Important Message  Patient Details  Name: [name] MRN: [id number] Date of Birth: 1950-06-09   Medicare Important Message Given:  Yes    Jadalyn Oliveri [address] 07/04/2016, 11:53 AM

## 2016-07-04 NOTE — H&P (Signed)
Physical Medicine and Rehabilitation Admission H&P       Chief Complaint  Patient presents with  . 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 Walks with a quad cane. He uses [employer] transportation. Daughter in the area question cyst.Presented 06/30/2016 with increasing lethargy over the past 3 days and multiple falls with altered mental status. Reports of diarrhea for the past few days, WBC 23,000, sodium 132. Cranial CT scan negative. Chest x-ray with no acute disease. C. difficile specimen negative. Protocol initiated for suspect sepsis. MRI showed diffusion restriction within the left aspect of corpus colostomy extending into the left cingulate gyrus in the paramedian left frontal lobe compatible with acute early subacute infarct, left ACA distribution. 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 Occlusion or near occlusion the left anterior cerebral artery 1.5 cm beyond its origin. Echocardiogram with ejection fraction of 16% grade 2 diastolic dysfunction. No wall motion abnormalities. Neurology consult at present maintain on aspirin 325 mg daily as well as Plavix for CVA prophylaxis3 months then Plavix alone. Subcutaneous Lovenox for DVT prophylaxis. WBC trending down 15,700. Tolerating a regular consistency diet. Physical and occupational therapy evaluations completed. M.D. has requested physical medicine rehabilitation consult.Patient was admitted for a comprehensive rehabilitation program  Review of Systems  Constitutional: Negative for chills and fever.  HENT: Negative for hearing loss and tinnitus.   Eyes: Negative for blurred  vision and double vision.  Respiratory: Negative for cough and shortness of breath.   Cardiovascular: Negative for chest pain and palpitations.  Gastrointestinal: Positive for constipation. Negative for nausea and vomiting.  Genitourinary: Positive for urgency. Negative for dysuria and hematuria.  Musculoskeletal: Positive for falls and myalgias.  Skin: Negative for rash.  Neurological: Positive for speech change and weakness. Negative for seizures.  All other systems reviewed and are negative.      Past Medical History:  Diagnosis Date  . 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 CVA (cerebral infarction) 01/03/2008   MRI: Acute 1 x 1.5 cm infarction affecting the left side of the pons.  . Diabetes mellitus   . DVT (deep venous thrombosis) ([place])   . Hypertension   . PAD (peripheral artery disease) ([place])   . Stroke [facility])    2008        Past Surgical History:  Procedure Laterality Date  . None          Family History  Problem Relation Age of Onset  . Diabetes Mother   . Heart attack Mother   . Hypertension Mother   . Diabetes Sister   . Hypertension Sister   . Hypertension Brother   . Hypertension Daughter   . Hypertension Son   . Colon cancer Neg Hx   . Esophageal cancer Neg Hx   . Stomach cancer Neg Hx   . Rectal cancer Neg Hx    Social History:  reports that he has never smoked. He has never used smokeless tobacco. He reports that he does not drink alcohol or use drugs. Allergies: No Known Allergies          Facility-Administered Medications Prior to Admission  Medication Dose Route Frequency Provider Last Rate Last Dose  .  0.9 %  sodium chloride infusion  500 mL Intravenous Continuous Gatha Mayer, MD             Medications Prior to Admission  Medication Sig Dispense Refill  . aspirin 81 MG tablet Take 1 tablet (81 mg total) by mouth daily. (Patient taking differently: Take 81 mg by mouth every morning. ) 30 tablet 11  .  atorvastatin (LIPITOR) 40 MG tablet Take 1 tablet (40 mg total) by mouth daily. (Patient taking differently: Take 40 mg by mouth every evening. ) 90 tablet 3  . B-D ULTRAFINE III SHORT PEN 31G X 8 MM MISC USE WITH LEVEMIR 100 each 0  . Blood Gluc Meter Disp-Strips (SIDEKICK BLOOD GLUCOSE SYSTEM) DEVI Use for daily testing 100 each 3  . carvedilol (COREG) 25 MG tablet TAKE 1 TABLET (25 MG TOTAL) BY MOUTH 2 (TWO) TIMES DAILY WITH A MEAL. 180 tablet 3  . clopidogrel (PLAVIX) 75 MG tablet TAKE 1 TABLET (75 MG TOTAL) BY MOUTH DAILY. (Patient taking differently: Take 75 mg by mouth every morning. ) 90 tablet 3  . hydrochlorothiazide (HYDRODIURIL) 25 MG tablet TAKE 1 TABLET (25 MG TOTAL) BY MOUTH DAILY. (Patient taking differently: TAKE 1 TABLET (25 MG TOTAL) BY MOUTH EVERY EVENING) 90 tablet 2  . insulin aspart (NOVOLOG FLEXPEN) 100 UNIT/ML FlexPen Inject 15 Units into the skin 2 (two) times daily with a meal. 3 pen 3  . Insulin Syringe-Needle U-100 28G X 1/2" 1 ML MISC 1 each by Does not apply route 3 (three) times daily. 100 each 11  . LEVEMIR FLEXTOUCH 100 UNIT/ML Pen INJECT 30 UNITS INTO THE SKIN DAILY 15 pen 5  . lisinopril (PRINIVIL,ZESTRIL) 40 MG tablet TAKE 1 TABLET (40 MG TOTAL) BY MOUTH DAILY. 90 tablet 3  . sertraline (ZOLOFT) 100 MG tablet TAKE 1 TABLET BY MOUTH EVERY DAY (Patient taking differently: TAKE 100 MG BY MOUTH EVERY MORNING) 90 tablet 1  . spironolactone (ALDACTONE) 50 MG tablet Take 1 tablet (50 mg total) by mouth daily. (Patient taking differently: Take 50 mg by mouth every morning. ) 90 tablet 1  . amLODipine (NORVASC) 10 MG tablet TAKE 1 TABLET (10 MG TOTAL) BY MOUTH DAILY. (Patient not taking: Reported on 06/30/2016) 90 tablet 3  . baclofen (LIORESAL) 10 MG tablet TAKE 1/2 TABLET BY MOUTH 1 time a DAY AS NEEDED FOR MUSCLE SPASMS (Patient not taking: Reported on 06/30/2016) 30 tablet 3  . sildenafil (REVATIO) 20 MG tablet Take 2-3 tablets (40-60 mg total) by mouth daily as needed  (intercourse). (Patient not taking: Reported on 06/30/2016) 30 tablet 0    Home: Home Living Family/patient expects to be discharged to:: Private residence Living Arrangements: Alone Available Help at Discharge: Family, Available PRN/intermittently Type of Home: House Home Access: Level entry Home Layout: One level Bathroom Shower/Tub: Tub/shower unit, Architectural technologist: Standard Bathroom Accessibility: Yes Home Equipment: Cane - quad, Bedside commode, Tub bench  Lives With: Alone, Other (Comment) (family that assists PRN)   Functional History: Prior Function Level of Independence: Needs assistance Gait / Transfers Assistance Needed: walks with quad cane without assist on flat surfaces ADL's / Homemaking Assistance Needed: Pt states he bathes and dresses himself, does simple cooking.  Family helps with cleaning and brings groceries.  Uses [employer] for transportation.  Functional Status:  Mobility: Bed Mobility Overal bed mobility: Needs Assistance Bed Mobility: Rolling, Sidelying to Sit, Sit to Sidelying Rolling: Mod assist Sidelying to sit: Max assist, HOB elevated Supine to sit: Min  guard, HOB elevated Sit to sidelying: Mod assist, HOB elevated General bed mobility comments: Patient rolls to Rt side using LUE to pull on bed rail.  Assist to bring trunk/LE's over.  Requires max assist to move to sitting - unable to use RUE to assist with pushing up.  Once upright, patient able to maintain sitting balance with min guard assist.  Patient sat EOB x 12 minutes.  Assist to bring RLE onto bed to return to sidelying.  Required +2 max assist to scoot toward HOB in supine. Transfers Overall transfer level: Needs assistance Equipment used: Quad cane, 2 person hand held assist Transfers: Sit to/from Stand Sit to Stand: Min assist, +2 physical assistance General transfer comment: NT Ambulation/Gait Ambulation/Gait assistance: Min assist, +2 physical assistance, +2  safety/equipment Ambulation Distance (Feet): 30 Feet Assistive device: Quad cane Gait Pattern/deviations: Step-to pattern, Decreased step length - right, Decreased stance time - right, Shuffle, Trunk flexed, Antalgic General Gait Details: Cues for QC placement and advancement of R LE.  Physical assist for balance, wt shift.  Advancement of R LE with hip hike, increased ER and circumduction to compensate for increased tone into extension and lack of knee flex/ankle DF in swing phase Gait velocity: decr  ADL: ADL Overall ADL's : Needs assistance/impaired Eating/Feeding: Set up, Sitting Grooming: Sitting, Minimal assistance Grooming Details (indicate cue type and reason): assist donning deodorant. Upper Body Bathing: Minimal assistance, Sitting Lower Body Bathing: Moderate assistance, Sit to/from stand Upper Body Dressing : Minimal assistance, Sitting Lower Body Dressing: Moderate assistance, Sit to/from stand Toilet Transfer: Minimal assistance, +2 for safety/equipment, BSC (quad cane) Toileting- Clothing Manipulation and Hygiene: Minimal assistance, Sit to/from stand, Sitting/lateral lean Functional mobility during ADLs: Minimal assistance, +2 for safety/equipment, Cueing for sequencing, Cueing for safety (quad cane) General ADL Comments: Pt overall not steady on feet and requires several cues for safety with quad cane when up on his feet.  Pt has difficulty reaching his feet.  Wears slip on bedroom shoes most of the time which may not be the best option given his RLE weakness.  Cognition: Cognition Overall Cognitive Status: History of cognitive impairments - at baseline Orientation Level: Oriented X4 Cognition Arousal/Alertness: Awake/alert Behavior During Therapy: WFL for tasks assessed/performed, Flat affect Overall Cognitive Status: History of cognitive impairments - at baseline General Comments: Pt does fairly well cognitively, alert and oriented following all commands.  Pt is  slow to process and with decreased problem solving skillls.   Difficult to assess due to: Impaired communication  Physical Exam: Blood pressure (!) 144/60, pulse (!) 58, temperature 98.6 F (37 C), temperature source Oral, resp. rate 20, height _0  (1.702 m), weight 111.1 kg (245 lb), SpO2 99 %. Physical Exam  Constitutional:  Obese   HENT:  Head: Normocephalic and atraumatic.     Eyes: Pupils are equal, round, and reactive to light. Left eye exhibits no discharge.  Pupils reactive to light  Neck: Normal range of motion. Neck supple. No JVD present. No tracheal deviation present. No thyromegaly present.  Cardiovascular: Normal rate and regular rhythm.  Exam reveals no friction rub.   No murmur heard. Respiratory: Effort normal and breath sounds normal. No respiratory distress. He has no wheezes. He has no rales. He exhibits no tenderness.  GI: Soft. Bowel sounds are normal. He exhibits no distension. There is no tenderness. There is no rebound.  Musculoskeletal: He exhibits edema (right lower extremity).  Skin .Warm and dry  Neurological:  More alert. Interacts/engages more. Speech remains  dysarthric. Expressive aphasia, using more short phrases today to communicate. Can follow simple commands. Right central 7 and tongue deviation. Demonstrates improved oro-motor control. Spastic right hemiparesis, 1 to 2-/5 RUE and RLE. Tone MAS 2-3/4 RUE. LUE 4/5 prox to distal. LLE: 3-4/5 prox to distal Psych: pleasant and cooperative  Lab Results Last 48 Hours        Results for orders placed or performed during the hospital encounter of 06/29/16 (from the past 48 hour(s))  Glucose, capillary     Status: Abnormal   Collection Time: 07/02/16 11:27 AM  Result Value Ref Range   Glucose-Capillary 256 (H) 65 - 99 mg/dL  Glucose, capillary     Status: Abnormal   Collection Time: 07/02/16  4:37 PM  Result Value Ref Range   Glucose-Capillary 177 (H) 65 - 99 mg/dL  Glucose, capillary      Status: Abnormal   Collection Time: 07/02/16  9:04 PM  Result Value Ref Range   Glucose-Capillary 144 (H) 65 - 99 mg/dL   Comment 1 Notify RN    Comment 2 Document in Chart   CBC with Differential/Platelet     Status: Abnormal   Collection Time: 07/03/16  3:45 AM  Result Value Ref Range   WBC 11.4 (H) 4.0 - 10.5 K/uL   RBC 3.78 (L) 4.22 - 5.81 MIL/uL   Hemoglobin 11.2 (L) 13.0 - 17.0 g/dL   HCT 34.6 (L) 39.0 - 52.0 %   MCV 91.5 78.0 - 100.0 fL   MCH 29.6 26.0 - 34.0 pg   MCHC 32.4 30.0 - 36.0 g/dL   RDW 12.5 11.5 - 15.5 %   Platelets 148 (L) 150 - 400 K/uL   Neutrophils Relative % 68 %   Neutro Abs 7.7 1.7 - 7.7 K/uL   Lymphocytes Relative 23 %   Lymphs Abs 2.6 0.7 - 4.0 K/uL   Monocytes Relative 6 %   Monocytes Absolute 0.7 0.1 - 1.0 K/uL   Eosinophils Relative 3 %   Eosinophils Absolute 0.3 0.0 - 0.7 K/uL   Basophils Relative 0 %   Basophils Absolute 0.0 0.0 - 0.1 K/uL  Basic metabolic panel     Status: Abnormal   Collection Time: 07/03/16  3:45 AM  Result Value Ref Range   Sodium 138 135 - 145 mmol/L   Potassium 3.9 3.5 - 5.1 mmol/L   Chloride 107 101 - 111 mmol/L   CO2 22 22 - 32 mmol/L   Glucose, Bld 117 (H) 65 - 99 mg/dL   BUN 10 6 - 20 mg/dL   Creatinine, Ser 0.86 0.61 - 1.24 mg/dL   Calcium 8.4 (L) 8.9 - 10.3 mg/dL   GFR calc non Af Amer >60 >60 mL/min   GFR calc Af Amer >60 >60 mL/min    Comment: (NOTE) The eGFR has been calculated using the CKD EPI equation. This calculation has not been validated in all clinical situations. eGFR's persistently <60 mL/min signify possible Chronic Kidney Disease.    Anion gap 9 5 - 15  Glucose, capillary     Status: None   Collection Time: 07/03/16  6:20 AM  Result Value Ref Range   Glucose-Capillary 91 65 - 99 mg/dL   Comment 1 Notify RN    Comment 2 Document in Chart   Hemoglobin A1c     Status: Abnormal   Collection Time: 07/03/16  7:52 AM  Result Value Ref Range    Hgb A1c MFr Bld 6.9 (H) 4.8 - 5.6 %  Comment: (NOTE)         Pre-diabetes: 5.7 - 6.4         Diabetes: >6.4         Glycemic control for adults with diabetes: <7.0    Mean Plasma Glucose 151 mg/dL    Comment: (NOTE) Performed At: [facility] [place], [place] [id number] Lindon Romp MD [id number]   Glucose, capillary     Status: Abnormal   Collection Time: 07/03/16 11:20 AM  Result Value Ref Range   Glucose-Capillary 185 (H) 65 - 99 mg/dL   Comment 1 Notify RN    Comment 2 Document in Chart   Glucose, capillary     Status: Abnormal   Collection Time: 07/03/16  4:25 PM  Result Value Ref Range   Glucose-Capillary 194 (H) 65 - 99 mg/dL  Glucose, capillary     Status: Abnormal   Collection Time: 07/03/16  9:25 PM  Result Value Ref Range   Glucose-Capillary 224 (H) 65 - 99 mg/dL   Comment 1 Notify RN    Comment 2 Document in Chart   Lipid panel     Status: Abnormal   Collection Time: 07/04/16  3:22 AM  Result Value Ref Range   Cholesterol 91 0 - 200 mg/dL   Triglycerides 43 <150 mg/dL   HDL 22 (L) >40 mg/dL   Total CHOL/HDL Ratio 4.1 RATIO   VLDL 9 0 - 40 mg/dL   LDL Cholesterol 60 0 - 99 mg/dL    Comment:        Total Cholesterol/HDL:CHD Risk Coronary Heart Disease Risk Table                     Men   Women  1/2 Average Risk   3.4   3.3  Average Risk       5.0   4.4  2 X Average Risk   9.6   7.1  3 X Average Risk  23.4   11.0        Use the calculated Patient Ratio above and the CHD Risk Table to determine the patient's CHD Risk.        ATP III CLASSIFICATION (LDL):  <100     mg/dL   Optimal  100-129  mg/dL   Near or Above                    Optimal  130-159  mg/dL   Borderline  160-189  mg/dL   High  >190     mg/dL   Very High   Glucose, capillary     Status: Abnormal   Collection Time: 07/04/16  6:37 AM  Result Value Ref Range   Glucose-Capillary 102 (H) 65 - 99 mg/dL   Comment 1 Notify RN     Comment 2 Document in Chart       Imaging Results (Last 48 hours)  Ct Angio Head W Or Wo Contrast  Result Date: 07/02/2016 CLINICAL DATA:  Hypertension and right hemiparesis. Slurred speech. Lethargy. EXAM: CT ANGIOGRAPHY HEAD AND NECK TECHNIQUE: Multidetector CT imaging of the head and neck was performed using the standard protocol during bolus administration of intravenous contrast. Multiplanar CT image reconstructions and MIPs were obtained to evaluate the vascular anatomy. Carotid stenosis measurements (when applicable) are obtained utilizing NASCET criteria, using the distal internal carotid diameter as the denominator. CONTRAST:  100 cc Isovue 370 COMPARISON:  MRI same day FINDINGS: CT HEAD FINDINGS Brain: Low-density  now evident affecting the left corpus callosum and cingulate gyrus consistent with acute infarction. No hemorrhage. 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 No hydrocephalus. Vascular: There is atherosclerotic calcification of the major vessels at the base of the brain. Skull: Negative Sinuses: Clear Orbits: Negative Review of the MIP images confirms the above findings CTA NECK FINDINGS Aortic arch: Aortic atherosclerosis. No aneurysm or dissection. Common origin of the innominate artery and left common carotid artery. Right carotid system: Common carotid artery widely patent to the bifurcation. Atherosclerotic disease at the carotid bifurcation, primarily calcified. Minimal diameter of the ICA is 3.5 mm. This indicates a 30% stenosis. Atherosclerotic disease at the skullbase results in narrowing to a diameter of 2.5 mm. This indicates a 50% stenosis. Left carotid system: Common carotid artery widely patent to the bifurcation. Atherosclerotic change in the ICA bulb, primarily calcified. Minimal diameter is 3.5 mm, consistent with 30% stenosis. Cervical ICA is widely patent beyond that. Vertebral arteries: 50% irregular  stenosis of the proximal left subclavian artery. Atherosclerotic calcification at both vertebral arteries and proximal vertebral arteries. Detail limited by shoulder density. Stenosis estimated at 50-70% on both sides. Beyond that, the vessels show some atherosclerotic change in the cervical region but are patent to the foramen magnum. Severe stenosis of the distal left vertebral artery at the foramen magnum, estimated at 80%. Skeleton: Ordinary spondylosis Other neck: No significant soft tissue lesion. Upper chest: Negative Review of the MIP images confirms the above findings CTA HEAD FINDINGS Anterior circulation: Internal carotid artery is patent through the siphon region. There is extensive atherosclerotic calcification in the carotid siphon region with stenosis estimated at 50-70%. Supraclinoid ICA is patent. There is flow in both the right middle cerebral artery and right anterior cerebral artery. Left ICA is patent through the siphon region. Again there is extensive calcification with stenosis estimated at 50-70%. Anterior and middle cerebral vessels are patent proximally. The left anterior cerebral artery is occluded or nearly occluded 1.5 cm beyond its origin. Some diminished flow beyond that could be collateral or reconstituted flow. Posterior circulation: Right vertebral artery is occluded after Pike up. As noted above, 80% stenosis of the left vertebral artery at the foramen magnum level. Beyond that, the vessel is small and irregular but does reach the basilar. No basilar stenosis. Superior cerebellar and posterior cerebral vessels show flow. Venous sinuses: Patent and normal Anatomic variants: None significant Delayed phase: No abnormal enhancement Review of the MIP images confirms the above findings IMPRESSION: Low-density now visible in the region of acute infarction affecting the left corpus callosum and cingulate gyrus. No hemorrhage. 50% irregular stenosis of the proximal left subclavian artery.  30% stenosis in the proximal ICA on both sides. 50% stenosis of the right cervical ICA just beneath the skullbase. Atherosclerotic disease at both proximal vertebral arteries worse on the right than the left. Stenoses in those regions estimated at 50-70% bilaterally. 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 There is some flow more distal to that within the left ACA, which could be reconstituted or collateral. 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 Posterior circulation branch vessels show flow. Electronically Signed   By: Nelson Chimes M.D.   On: 07/02/2016  20:35   Ct Angio Neck W Or Wo Contrast  Result Date: 07/02/2016 CLINICAL DATA:  Hypertension and right hemiparesis. Slurred speech. Lethargy. EXAM: CT ANGIOGRAPHY HEAD AND NECK TECHNIQUE: Multidetector CT imaging of the head and neck was performed using the standard protocol during bolus administration of intravenous contrast. Multiplanar CT image reconstructions and MIPs were obtained to evaluate the vascular anatomy. Carotid stenosis measurements (when applicable) are obtained utilizing NASCET criteria, using the distal internal carotid diameter as the denominator. CONTRAST:  100 cc Isovue 370 COMPARISON:  MRI same day FINDINGS: CT HEAD FINDINGS Brain: Low-density now evident affecting the left corpus callosum and cingulate gyrus consistent with acute infarction. No hemorrhage. 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 No hydrocephalus. Vascular: There is atherosclerotic calcification of the major vessels at the base of the brain. Skull: Negative Sinuses: Clear Orbits: Negative Review of the  MIP images confirms the above findings CTA NECK FINDINGS Aortic arch: Aortic atherosclerosis. No aneurysm or dissection. Common origin of the innominate artery and left common carotid artery. Right carotid system: Common carotid artery widely patent to the bifurcation. Atherosclerotic disease at the carotid bifurcation, primarily calcified. Minimal diameter of the ICA is 3.5 mm. This indicates a 30% stenosis. Atherosclerotic disease at the skullbase results in narrowing to a diameter of 2.5 mm. This indicates a 50% stenosis. Left carotid system: Common carotid artery widely patent to the bifurcation. Atherosclerotic change in the ICA bulb, primarily calcified. Minimal diameter is 3.5 mm, consistent with 30% stenosis. Cervical ICA is widely patent beyond that. Vertebral arteries: 50% irregular stenosis of the proximal left subclavian artery. Atherosclerotic calcification at both vertebral arteries and proximal vertebral arteries. Detail limited by shoulder density. Stenosis estimated at 50-70% on both sides. Beyond that, the vessels show some atherosclerotic change in the cervical region but are patent to the foramen magnum. Severe stenosis of the distal left vertebral artery at the foramen magnum, estimated at 80%. Skeleton: Ordinary spondylosis Other neck: No significant soft tissue lesion. Upper chest: Negative Review of the MIP images confirms the above findings CTA HEAD FINDINGS Anterior circulation: Internal carotid artery is patent through the siphon region. There is extensive atherosclerotic calcification in the carotid siphon region with stenosis estimated at 50-70%. Supraclinoid ICA is patent. There is flow in both the right middle cerebral artery and right anterior cerebral artery. Left ICA is patent through the siphon region. Again there is extensive calcification with stenosis estimated at 50-70%. Anterior and middle cerebral vessels are patent proximally. The left anterior cerebral artery is occluded or  nearly occluded 1.5 cm beyond its origin. Some diminished flow beyond that could be collateral or reconstituted flow. Posterior circulation: Right vertebral artery is occluded after Pike up. As noted above, 80% stenosis of the left vertebral artery at the foramen magnum level. Beyond that, the vessel is small and irregular but does reach the basilar. No basilar stenosis. Superior cerebellar and posterior cerebral vessels show flow. Venous sinuses: Patent and normal Anatomic variants: None significant Delayed phase: No abnormal enhancement Review of the MIP images confirms the above findings IMPRESSION: Low-density now visible in the region of acute infarction affecting the left corpus callosum and cingulate gyrus. No hemorrhage. 50% irregular stenosis of the proximal left subclavian artery. 30% stenosis in the proximal ICA on both sides. 50% stenosis of the right cervical ICA just beneath the skullbase. Atherosclerotic disease at both proximal vertebral arteries worse on the right than the left. Stenoses in those  regions estimated at 50-70% bilaterally. 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 There is some flow more distal to that within the left ACA, which could be reconstituted or collateral. 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 Posterior circulation branch vessels show flow. Electronically Signed   By: Nelson Chimes M.D.   On: 07/02/2016 20:35        Medical Problem List and Plan: 1.  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  DVT Prophylaxis/Anticoagulation: Subcutaneous Lovenox. Monitor platelet counts and any signs of bleeding 3. Pain Management: Tylenol as needed             -consider baclofen trial for spasticity 4. Mood: Zoloft 100 mg daily 5. Neuropsych: This patient is capable of making decisions on his own behalf. 6. Skin/Wound Care: Routine skin checks 7. Fluids/Electrolytes/Nutrition: Routine I&O with follow-up chemistries 8. Diabetes mellitus of peripheral neuropathy. Hemoglobin A1c 6.9. Levemir 30 units daily. Check blood sugars before meals and at bedtime. Diabetic teaching 9. CRI stage II. Follow-up chemistries upon admit. 10. Hypertension. Permissive hypertension. Patient on Coreg 25 mg twice a day, HCTZ 25 mg daily, lisinopril 40 mg daily, Aldactone 50 mg daily, Norvasc 10 mg daily, Revatio 60 mg daily prior to admission. Resume as needed 11. Hyperlipidemia. Lipitor 12. Question medical compliance. Provide education  Post Admission Physician Evaluation: 1. Functional deficits secondary  to left ACA infarct. 2. Patient is admitted to receive collaborative, interdisciplinary care between the physiatrist, rehab nursing staff, and therapy team. 3. Patient's level of medical complexity and substantial therapy needs in context of that medical necessity cannot be provided at a lesser intensity of care such as a SNF. 4. Patient has experienced substantial functional loss from his/her baseline which was documented above under the "Functional History" and "Functional Status" headings.  Judging by the patient's diagnosis, physical exam, and functional history, the patient has potential for functional progress which will result in measurable gains while on inpatient rehab.  These gains will be of substantial and practical use upon discharge  in facilitating mobility and self-care at the household level. 5. Physiatrist will provide 24 hour management of medical needs as well as oversight of the therapy  plan/treatment and provide guidance as appropriate regarding the interaction of the two. 6. The Preadmission Screening has been reviewed and patient status is unchanged unless otherwise stated above. 7. 24 hour rehab nursing will assist with bladder management, bowel management, safety, skin/wound care, disease management, medication administration, pain management and patient education  and help integrate therapy concepts, techniques,education, etc. 8. PT will assess and treat for/with: Lower extremity strength, range of motion, stamina, balance, functional mobility, safety, adaptive techniques and equipment, NMR.   Goals are: supervision to min assist. 9. OT will assess and treat for/with: ADL's, functional mobility, safety, upper extremity strength, adaptive techniques and equipment, NMR, family ed.   Goals are: supervision to min assist. Therapy may proceed with showering this patient. 10. SLP will assess and treat for/with: cognition, language, communication.  Goals are: supervision to min assist.  11. Case Management and Social Worker will assess and treat for psychological issues and discharge planning. 12. Team conference will be held weekly to assess progress toward goals and to determine barriers to discharge. 13. Patient will receive at least 3 hours of therapy per day at least 5 days per week. 14. ELOS: 20-25 days       15. Prognosis:  excellent     Meredith Staggers, MD, [facility] 07/04/2016  Cathlyn Parsons., PA-C 07/04/2016

## 2016-07-04 NOTE — Discharge Summary (Addendum)
PATIENT DETAILS Name: [name] Age: 66 y.o. Sex: male Date of Birth: 03-28-1951 MRN: [id number]. Admitting Physician: Ivor Costa, MD [id number] Criss Rosales, MD  Admit Date: 06/29/2016 Discharge date: 07/04/2016  Recommendations for Outpatient Follow-up:  1. Follow up with PCP in 1-2 weeks 2. Please obtain BMP/CBC in one week 3. Repeat A1C and Lipid panel in 3 months 4. Please ensure follow up stroke MD/Neurology 5. Follow blood cultures until final 6. Continue DAPT for 3 months and then plavix alone   Admitted From:  Home  Disposition: SNF vs CIR when bed is available   Home Health: No  Equipment/Devices: None  Discharge Condition: Stable  CODE STATUS: FULL CODE  Diet recommendation:  Heart Healthy / Carb Modified   Brief Summary: See H&P, Labs, Consult and Test reports for all details in brief, Patient is a 66 y.o. male with a priorhistoryofCVAandchronicrighthemiplegia diabetes, hypertension presented with dysarthria, lethargy and fall along with diarrhea. Further evaluation revealed a acute CVA in the left frontal lobe area. He was subsequently admitted to the hospital for further evaluation and treatment. Below for further details  Brief Hospital Course: Acute ischemic CVA: Admitted initially with encephalopathy-but subsequently found to be dysarthric. Underwent MRI brain which showed a acute infarct in the left ACA dristibution area.  CT angiogram neck does not show any significant stenosis, however it does show occlusion or near occlusion of the left anterior cerebral artery. Echocardiogram shows preserved EF without any obvious embolic source.  Telemetry-negative for atrial fibrillation. He was already on dual antiplatelet therapy with aspirin and Plavix before this hospitalization-this is currently being continued. Lipitor also being continued. A1c 6.9, LDL 60. He has chronic right-sided hemiplegia that seems essentially unchanged. Seen by Stroke  MD-recommendations are to continue DAPT for 3 months and then plavix alone  Acute encephalopathy: Seems to have resolved-he is dysarthric and slow but following commands. Suspect is multifactorial-due to CVA, dehydration, gastroenteritis.  Acute kidney injury: Resolved. Likely mild hemodynamic mediated kidney injury due to diarrhea.  Diarrhea: Seems to have resolved, stool C. difficile panel and GI pathogen panel negative. Supportive care   Systemic inflammatory response syndrome: Patient did have significant leukocytosis on admission-but I doubt sepsis pathophysiology. Leukocytosis probably was secondary to diarrheal illness, acute CVA..Cultures, stool studies continue to be negative. Leukocytosis has almost resolved without the use of any antimicrobial therapy.  Insulin-dependent type 2 diabetes: CBGs stable, A1c 6.9. Continue Levemir 30 units daily along with Novolog. Follow and adjust accordingly  Dyslipidemia: Continue statin-LDL 60  Hypertension:Initially permissive hypertension was allowed-but will be resumed on all his prior anti-hypertensive's on discharge.   History of depression: Appears stable-continue Zoloft   Procedures/Studies: Echo 1/31>> - Left ventricle: The cavity size was normal. Wall thickness was increased in a pattern of mild LVH. Systolic function was normal. The estimated ejection fraction was in the range of 55% to 60%. Wall motion was normal; there were no regional wall motion abnormalities. Features are consistent with a pseudonormal left ventricular filling pattern, with concomitant abnormal relaxation and increased filling pressure (grade 2 diastolic dysfunction). - Aortic valve: Mildly calcified annulus. There was trivial regurgitation.  Discharge Diagnoses:  Principal Problem:   Acute encephalopathy Active Problems:   Diabetes mellitus, type II ([place])   Essential hypertension, benign   Hemiparesis affecting right side as late  effect of stroke ([facility])   Altered mental status   Fall   Sepsis ([place])   Diarrhea   Acute renal failure superimposed on stage 2 chronic  kidney disease ([place])   Cerebrovascular accident (CVA) [facility]   Discharge Instructions:  Activity:  As tolerated with Full fall precautions use walker/cane & assistance as needed  Discharge Instructions    Ambulatory referral to Neurology    Complete by:  As directed    Follow up with NP Cecille Rubin at [facility] in about 2 months. Thanks.   Ambulatory referral to Neurology    Complete by:  As directed    Diet - low sodium heart healthy    Complete by:  As directed    Diet Carb Modified    Complete by:  As directed    Discharge instructions    Complete by:  As directed    CHECK CBG'S AC AND HS   Increase activity slowly    Complete by:  As directed      Allergies as of 07/04/2016   No Known Allergies     Medication List    STOP taking these medications   amLODipine 10 MG tablet Commonly known as:  NORVASC   sildenafil 20 MG tablet Commonly known as:  REVATIO     TAKE these medications   aspirin 325 MG tablet Take 1 tablet (325 mg total) by mouth daily. What changed:  medication strength  how much to take   atorvastatin 40 MG tablet Commonly known as:  LIPITOR Take 1 tablet (40 mg total) by mouth daily. What changed:  when to take this   B-D ULTRAFINE III SHORT PEN 31G X 8 MM Misc Generic drug:  Insulin Pen Needle USE WITH LEVEMIR   baclofen 10 MG tablet Commonly known as:  LIORESAL TAKE 1/2 TABLET BY MOUTH 1 time a DAY AS NEEDED FOR MUSCLE SPASMS   carvedilol 25 MG tablet Commonly known as:  COREG TAKE 1 TABLET (25 MG TOTAL) BY MOUTH 2 (TWO) TIMES DAILY WITH A MEAL.   clopidogrel 75 MG tablet Commonly known as:  PLAVIX TAKE 1 TABLET (75 MG TOTAL) BY MOUTH DAILY. What changed:  how much to take  how to take this  when to take this  additional instructions   hydrochlorothiazide 25 MG tablet Commonly known as:   HYDRODIURIL TAKE 1 TABLET (25 MG TOTAL) BY MOUTH DAILY. What changed:  See the new instructions.   insulin aspart 100 UNIT/ML FlexPen Commonly known as:  NOVOLOG FLEXPEN Inject 15 Units into the skin 2 (two) times daily with a meal.   Insulin Syringe-Needle U-100 28G X 1/2" 1 ML Misc 1 each by Does not apply route 3 (three) times daily.   LEVEMIR FLEXTOUCH 100 UNIT/ML Pen Generic drug:  Insulin Detemir INJECT 30 UNITS INTO THE SKIN DAILY   lisinopril 40 MG tablet Commonly known as:  PRINIVIL,ZESTRIL TAKE 1 TABLET (40 MG TOTAL) BY MOUTH DAILY.   sertraline 100 MG tablet Commonly known as:  ZOLOFT TAKE 1 TABLET BY MOUTH EVERY DAY What changed:  See the new instructions.   SIDEKICK BLOOD GLUCOSE SYSTEM Devi Use for daily testing   spironolactone 50 MG tablet Commonly known as:  ALDACTONE Take 1 tablet (50 mg total) by mouth daily. What changed:  when to take this      Follow-up Information    Dennie Bible, NP. Schedule an appointment as soon as possible for a visit in 6 week(s).   Specialty:  Family Medicine Contact information: [address] [place] [address] [phone]        Lockie Pares, MD. Schedule an appointment as soon as possible  for a visit in 2 week(s).   Specialty:  Family Medicine Contact information: [place] [address] [phone]          No Known Allergies  Consultations:   neurology  Other Procedures/Studies: Ct Angio Head W Or Wo Contrast  Result Date: 07/02/2016 CLINICAL DATA:  Hypertension and right hemiparesis. Slurred speech. Lethargy. EXAM: CT ANGIOGRAPHY HEAD AND NECK TECHNIQUE: Multidetector CT imaging of the head and neck was performed using the standard protocol during bolus administration of intravenous contrast. Multiplanar CT image reconstructions and MIPs were obtained to evaluate the vascular anatomy. Carotid stenosis measurements (when applicable) are obtained utilizing  NASCET criteria, using the distal internal carotid diameter as the denominator. CONTRAST:  100 cc Isovue 370 COMPARISON:  MRI same day FINDINGS: CT HEAD FINDINGS Brain: Low-density now evident affecting the left corpus callosum and cingulate gyrus consistent with acute infarction. No hemorrhage. 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 No hydrocephalus. Vascular: There is atherosclerotic calcification of the major vessels at the base of the brain. Skull: Negative Sinuses: Clear Orbits: Negative Review of the MIP images confirms the above findings CTA NECK FINDINGS Aortic arch: Aortic atherosclerosis. No aneurysm or dissection. Common origin of the innominate artery and left common carotid artery. Right carotid system: Common carotid artery widely patent to the bifurcation. Atherosclerotic disease at the carotid bifurcation, primarily calcified. Minimal diameter of the ICA is 3.5 mm. This indicates a 30% stenosis. Atherosclerotic disease at the skullbase results in narrowing to a diameter of 2.5 mm. This indicates a 50% stenosis. Left carotid system: Common carotid artery widely patent to the bifurcation. Atherosclerotic change in the ICA bulb, primarily calcified. Minimal diameter is 3.5 mm, consistent with 30% stenosis. Cervical ICA is widely patent beyond that. Vertebral arteries: 50% irregular stenosis of the proximal left subclavian artery. Atherosclerotic calcification at both vertebral arteries and proximal vertebral arteries. Detail limited by shoulder density. Stenosis estimated at 50-70% on both sides. Beyond that, the vessels show some atherosclerotic change in the cervical region but are patent to the foramen magnum. Severe stenosis of the distal left vertebral artery at the foramen magnum, estimated at 80%. Skeleton: Ordinary spondylosis Other neck: No significant soft tissue lesion. Upper chest: Negative Review of the MIP images confirms the  above findings CTA HEAD FINDINGS Anterior circulation: Internal carotid artery is patent through the siphon region. There is extensive atherosclerotic calcification in the carotid siphon region with stenosis estimated at 50-70%. Supraclinoid ICA is patent. There is flow in both the right middle cerebral artery and right anterior cerebral artery. Left ICA is patent through the siphon region. Again there is extensive calcification with stenosis estimated at 50-70%. Anterior and middle cerebral vessels are patent proximally. The left anterior cerebral artery is occluded or nearly occluded 1.5 cm beyond its origin. Some diminished flow beyond that could be collateral or reconstituted flow. Posterior circulation: Right vertebral artery is occluded after Pike up. As noted above, 80% stenosis of the left vertebral artery at the foramen magnum level. Beyond that, the vessel is small and irregular but does reach the basilar. No basilar stenosis. Superior cerebellar and posterior cerebral vessels show flow. Venous sinuses: Patent and normal Anatomic variants: None significant Delayed phase: No abnormal enhancement Review of the MIP images confirms the above findings IMPRESSION: Low-density now visible in the region of acute infarction affecting the left corpus callosum and cingulate gyrus. No hemorrhage. 50% irregular stenosis of the proximal left  subclavian artery. 30% stenosis in the proximal ICA on both sides. 50% stenosis of the right cervical ICA just beneath the skullbase. Atherosclerotic disease at both proximal vertebral arteries worse on the right than the left. Stenoses in those regions estimated at 50-70% bilaterally. 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 There is some flow more distal to that within the left ACA, which could be  reconstituted or collateral. 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 Posterior circulation branch vessels show flow. Electronically Signed   By: Nelson Chimes M.D.   On: 07/02/2016 20:35   Dg Chest 2 View  Result Date: 06/30/2016 CLINICAL DATA:  Lethargy 2-3 days and foul-smelling urine. Several recent falls. EXAM: CHEST  2 VIEW COMPARISON:  01/08/2008 FINDINGS: Lungs are hypoinflated without focal airspace consolidation or effusion. Cardiomediastinal silhouette is within normal. There is calcified plaque over the aortic arch. There are degenerative changes of the spine. IMPRESSION: Hypoinflation without acute cardiopulmonary disease. Aortic atherosclerosis. Electronically Signed   By: Marin Olp M.D.   On: 06/30/2016 00:02   Ct Head Wo Contrast  Result Date: 06/30/2016 CLINICAL DATA:  Initial evaluation for acute weakness. Multiple falls. EXAM: CT HEAD WITHOUT CONTRAST TECHNIQUE: Contiguous axial images were obtained from the base of the skull through the vertex without intravenous contrast. COMPARISON:  Prior MRI from 01/03/2008. FINDINGS: Brain: Age appropriate cerebral atrophy with mild chronic small vessel ischemic disease. No acute intracranial hemorrhage. No evidence for acute large vessel territory infarct. No mass lesion, midline shift or mass effect. No hydrocephalus. Posterior fossa cyst again noted. No other extra-axial fluid collection. Vascular: No hyperdense vessel. Scattered vascular calcifications noted within the carotid siphons. Skull: Scalp soft tissues demonstrate no acute abnormality. Calvarium intact. Sinuses/Orbits: Globes and orbital soft tissues within normal limits. Mild mucosal thickening within the ethmoidal air cells and maxillary sinuses. Paranasal sinuses are otherwise clear. No mastoid effusion. IMPRESSION: 1. No acute intracranial process identified.  2. Mild chronic microvascular ischemic disease. Electronically Signed   By: Jeannine Boga M.D.   On: 06/30/2016 00:39   Ct Angio Neck W Or Wo Contrast  Result Date: 07/02/2016 CLINICAL DATA:  Hypertension and right hemiparesis. Slurred speech. Lethargy. EXAM: CT ANGIOGRAPHY HEAD AND NECK TECHNIQUE: Multidetector CT imaging of the head and neck was performed using the standard protocol during bolus administration of intravenous contrast. Multiplanar CT image reconstructions and MIPs were obtained to evaluate the vascular anatomy. Carotid stenosis measurements (when applicable) are obtained utilizing NASCET criteria, using the distal internal carotid diameter as the denominator. CONTRAST:  100 cc Isovue 370 COMPARISON:  MRI same day FINDINGS: CT HEAD FINDINGS Brain: Low-density now evident affecting the left corpus callosum and cingulate gyrus consistent with acute infarction. No hemorrhage. 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 No hydrocephalus. Vascular: There is atherosclerotic calcification of the major vessels at the base of the brain. Skull: Negative Sinuses: Clear Orbits: Negative Review of the MIP images confirms the above findings CTA NECK FINDINGS Aortic arch: Aortic atherosclerosis. No aneurysm or dissection. Common origin of the innominate artery and left common carotid artery. Right carotid system: Common carotid artery widely patent to the bifurcation. Atherosclerotic disease at the carotid bifurcation, primarily calcified. Minimal diameter  of the ICA is 3.5 mm. This indicates a 30% stenosis. Atherosclerotic disease at the skullbase results in narrowing to a diameter of 2.5 mm. This indicates a 50% stenosis. Left carotid system: Common carotid artery widely patent to the bifurcation. Atherosclerotic change in the ICA bulb, primarily calcified. Minimal diameter is 3.5 mm, consistent with 30% stenosis. Cervical ICA is widely patent  beyond that. Vertebral arteries: 50% irregular stenosis of the proximal left subclavian artery. Atherosclerotic calcification at both vertebral arteries and proximal vertebral arteries. Detail limited by shoulder density. Stenosis estimated at 50-70% on both sides. Beyond that, the vessels show some atherosclerotic change in the cervical region but are patent to the foramen magnum. Severe stenosis of the distal left vertebral artery at the foramen magnum, estimated at 80%. Skeleton: Ordinary spondylosis Other neck: No significant soft tissue lesion. Upper chest: Negative Review of the MIP images confirms the above findings CTA HEAD FINDINGS Anterior circulation: Internal carotid artery is patent through the siphon region. There is extensive atherosclerotic calcification in the carotid siphon region with stenosis estimated at 50-70%. Supraclinoid ICA is patent. There is flow in both the right middle cerebral artery and right anterior cerebral artery. Left ICA is patent through the siphon region. Again there is extensive calcification with stenosis estimated at 50-70%. Anterior and middle cerebral vessels are patent proximally. The left anterior cerebral artery is occluded or nearly occluded 1.5 cm beyond its origin. Some diminished flow beyond that could be collateral or reconstituted flow. Posterior circulation: Right vertebral artery is occluded after Pike up. As noted above, 80% stenosis of the left vertebral artery at the foramen magnum level. Beyond that, the vessel is small and irregular but does reach the basilar. No basilar stenosis. Superior cerebellar and posterior cerebral vessels show flow. Venous sinuses: Patent and normal Anatomic variants: None significant Delayed phase: No abnormal enhancement Review of the MIP images confirms the above findings IMPRESSION: Low-density now visible in the region of acute infarction affecting the left corpus callosum and cingulate gyrus. No hemorrhage. 50% irregular  stenosis of the proximal left subclavian artery. 30% stenosis in the proximal ICA on both sides. 50% stenosis of the right cervical ICA just beneath the skullbase. Atherosclerotic disease at both proximal vertebral arteries worse on the right than the left. Stenoses in those regions estimated at 50-70% bilaterally. 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 There is some flow more distal to that within the left ACA, which could be reconstituted or collateral. 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 Posterior circulation branch vessels show flow. Electronically Signed   By: Nelson Chimes M.D.   On: 07/02/2016 20:35   Mr Brain Wo Contrast  Result Date: 07/01/2016 CLINICAL DATA:  66 y/o M; acute weakness, altered mental status, and multiple falls with concern for acute stroke. EXAM: MRI HEAD WITHOUT CONTRAST TECHNIQUE: Multiplanar, multiecho pulse sequences of the brain and surrounding structures were obtained without intravenous contrast. COMPARISON:  06/29/2016 CT head.  01/03/2008 MRI head. FINDINGS: Brain: Diffusion restriction within the left aspect of corpus callosum extending into left cingulate gyrus in paramedian frontal lobe compatible with acute/ early subacute infarction. 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 Chronic infarct within the left hemi pons. Moderate diffuse supratentorial and infratentorial brain parenchymal volume loss. Background of mild chronic microvascular ischemic changes in white matter. No hydrocephalus. No extra-axial collection. No abnormal susceptibility  hypointensity to indicate intracranial hemorrhage. Vascular: Normal flow voids. Skull and upper cervical spine: Normal marrow signal. Sinuses/Orbits: Mild diffuse paranasal sinus mucosal thickening greatest in the maxillary sinuses. No abnormal signal of the mastoid air cells. Orbits are unremarkable. Other: None. IMPRESSION: 1. Diffusion restriction within the left aspect of corpus callosum extending into left cingulate gyrus in paramedian frontal lobe compatible with acute/ early subacute infarction, left ACA distribution. No acute hemorrhage identified. 2. Mild chronic microvascular ischemic changes and moderate diffuse brain parenchymal volume loss. Chronic left pons infarct. 3. Mild diffuse paranasal sinus disease. These results will be called to the ordering clinician or representative by the Radiologist Assistant, and communication documented in the PACS or zVision Dashboard. Electronically Signed   By: Kristine Garbe M.D.   On: 07/01/2016 14:57      TODAY-DAY OF DISCHARGE:  Subjective:   [name] today has no headache,no chest abdominal pain,no new weakness tingling or numbness,   Objective:   Blood pressure (!) 144/60, pulse (!) 58, temperature 98.6 F (37 C), temperature source Oral, resp. rate 20, height 5\' 7"  (1.702 m), weight 111.1 kg (245 lb), SpO2 99 %.  Intake/Output Summary (Last 24 hours) at 07/04/16 0854 Last data filed at 07/04/16 0148  Gross per 24 hour  Intake              480 ml  Output              600 ml  Net             -120 ml   Filed Weights   06/30/16 0500 07/01/16 2222  Weight: 88.8 kg (195 lb 12.3 oz) 111.1 kg (245 lb)    Exam: Awake Alert, Oriented *3, No new F.N deficits, Normal affect [place].AT,PERRAL Supple Neck,No JVD, No cervical lymphadenopathy appriciated.  Symmetrical Chest wall movement, Good air movement bilaterally, CTAB RRR,No Gallops,Rubs or new Murmurs, No Parasternal Heave +ve B.Sounds, Abd Soft, Non tender, No organomegaly  appriciated, No rebound -guarding or rigidity. No Cyanosis, Clubbing or edema, No new Rash or bruise   PERTINENT RADIOLOGIC STUDIES: Ct Angio Head W Or Wo Contrast  Result Date: 07/02/2016 CLINICAL DATA:  Hypertension and right hemiparesis. Slurred speech. Lethargy. EXAM: CT ANGIOGRAPHY HEAD AND NECK TECHNIQUE: Multidetector CT imaging of the head and neck was performed using the standard protocol during bolus administration of intravenous contrast. Multiplanar CT image reconstructions and MIPs were obtained to evaluate the vascular anatomy. Carotid stenosis measurements (when applicable) are obtained utilizing NASCET criteria, using the distal internal carotid diameter as the denominator. CONTRAST:  100 cc Isovue 370 COMPARISON:  MRI same day FINDINGS: CT HEAD FINDINGS Brain: Low-density now evident affecting the left corpus callosum and cingulate gyrus consistent with acute infarction. No hemorrhage. 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 No hydrocephalus. Vascular: There is atherosclerotic calcification of the major vessels at the base of the brain. Skull: Negative Sinuses: Clear Orbits: Negative Review of the MIP images confirms the above findings CTA NECK FINDINGS Aortic arch: Aortic atherosclerosis. No aneurysm or dissection. Common origin of the innominate artery and left common carotid artery. Right carotid system: Common carotid artery widely patent to the bifurcation. Atherosclerotic disease at the carotid bifurcation, primarily calcified. Minimal diameter  of the ICA is 3.5 mm. This indicates a 30% stenosis. Atherosclerotic disease at the skullbase results in narrowing to a diameter of 2.5 mm. This indicates a 50% stenosis. Left carotid system: Common carotid artery widely patent to the bifurcation. Atherosclerotic change in the ICA bulb, primarily calcified. Minimal diameter is 3.5 mm, consistent with 30% stenosis. Cervical ICA is  widely patent beyond that. Vertebral arteries: 50% irregular stenosis of the proximal left subclavian artery. Atherosclerotic calcification at both vertebral arteries and proximal vertebral arteries. Detail limited by shoulder density. Stenosis estimated at 50-70% on both sides. Beyond that, the vessels show some atherosclerotic change in the cervical region but are patent to the foramen magnum. Severe stenosis of the distal left vertebral artery at the foramen magnum, estimated at 80%. Skeleton: Ordinary spondylosis Other neck: No significant soft tissue lesion. Upper chest: Negative Review of the MIP images confirms the above findings CTA HEAD FINDINGS Anterior circulation: Internal carotid artery is patent through the siphon region. There is extensive atherosclerotic calcification in the carotid siphon region with stenosis estimated at 50-70%. Supraclinoid ICA is patent. There is flow in both the right middle cerebral artery and right anterior cerebral artery. Left ICA is patent through the siphon region. Again there is extensive calcification with stenosis estimated at 50-70%. Anterior and middle cerebral vessels are patent proximally. The left anterior cerebral artery is occluded or nearly occluded 1.5 cm beyond its origin. Some diminished flow beyond that could be collateral or reconstituted flow. Posterior circulation: Right vertebral artery is occluded after Pike up. As noted above, 80% stenosis of the left vertebral artery at the foramen magnum level. Beyond that, the vessel is small and irregular but does reach the basilar. No basilar stenosis. Superior cerebellar and posterior cerebral vessels show flow. Venous sinuses: Patent and normal Anatomic variants: None significant Delayed phase: No abnormal enhancement Review of the MIP images confirms the above findings IMPRESSION: Low-density now visible in the region of acute infarction affecting the left corpus callosum and cingulate gyrus. No hemorrhage. 50%  irregular stenosis of the proximal left subclavian artery. 30% stenosis in the proximal ICA on both sides. 50% stenosis of the right cervical ICA just beneath the skullbase. Atherosclerotic disease at both proximal vertebral arteries worse on the right than the left. Stenoses in those regions estimated at 50-70% bilaterally. 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 There is some flow more distal to that within the left ACA, which could be reconstituted or collateral. 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 Posterior circulation branch vessels show flow. Electronically Signed   By: Nelson Chimes M.D.   On: 07/02/2016 20:35   Dg Chest 2 View  Result Date: 06/30/2016 CLINICAL DATA:  Lethargy 2-3 days and foul-smelling urine. Several recent falls. EXAM: CHEST  2 VIEW COMPARISON:  01/08/2008 FINDINGS: Lungs are hypoinflated without focal airspace consolidation or effusion. Cardiomediastinal silhouette is within normal. There is calcified plaque over the aortic arch. There are degenerative changes of the spine. IMPRESSION: Hypoinflation without acute cardiopulmonary disease. Aortic atherosclerosis. Electronically Signed   By: Marin Olp M.D.   On: 06/30/2016 00:02   Ct Head Wo Contrast  Result Date: 06/30/2016 CLINICAL DATA:  Initial evaluation for acute weakness. Multiple falls. EXAM: CT HEAD  WITHOUT CONTRAST TECHNIQUE: Contiguous axial images were obtained from the base of the skull through the vertex without intravenous contrast. COMPARISON:  Prior MRI from 01/03/2008. FINDINGS: Brain: Age appropriate cerebral atrophy with mild chronic small vessel ischemic disease. No acute intracranial hemorrhage. No  evidence for acute large vessel territory infarct. No mass lesion, midline shift or mass effect. No hydrocephalus. Posterior fossa cyst again noted. No other extra-axial fluid collection. Vascular: No hyperdense vessel. Scattered vascular calcifications noted within the carotid siphons. Skull: Scalp soft tissues demonstrate no acute abnormality. Calvarium intact. Sinuses/Orbits: Globes and orbital soft tissues within normal limits. Mild mucosal thickening within the ethmoidal air cells and maxillary sinuses. Paranasal sinuses are otherwise clear. No mastoid effusion. IMPRESSION: 1. No acute intracranial process identified. 2. Mild chronic microvascular ischemic disease. Electronically Signed   By: Jeannine Boga M.D.   On: 06/30/2016 00:39   Ct Angio Neck W Or Wo Contrast  Result Date: 07/02/2016 CLINICAL DATA:  Hypertension and right hemiparesis. Slurred speech. Lethargy. EXAM: CT ANGIOGRAPHY HEAD AND NECK TECHNIQUE: Multidetector CT imaging of the head and neck was performed using the standard protocol during bolus administration of intravenous contrast. Multiplanar CT image reconstructions and MIPs were obtained to evaluate the vascular anatomy. Carotid stenosis measurements (when applicable) are obtained utilizing NASCET criteria, using the distal internal carotid diameter as the denominator. CONTRAST:  100 cc Isovue 370 COMPARISON:  MRI same day FINDINGS: CT HEAD FINDINGS Brain: Low-density now evident affecting the left corpus callosum and cingulate gyrus consistent with acute infarction. No hemorrhage. 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 No hydrocephalus. Vascular: There is atherosclerotic calcification of the major vessels at the base of the brain. Skull: Negative Sinuses: Clear Orbits: Negative Review of the MIP images confirms the above findings CTA NECK FINDINGS Aortic arch: Aortic atherosclerosis. No aneurysm or dissection.  Common origin of the innominate artery and left common carotid artery. Right carotid system: Common carotid artery widely patent to the bifurcation. Atherosclerotic disease at the carotid bifurcation, primarily calcified. Minimal diameter of the ICA is 3.5 mm. This indicates a 30% stenosis. Atherosclerotic disease at the skullbase results in narrowing to a diameter of 2.5 mm. This indicates a 50% stenosis. Left carotid system: Common carotid artery widely patent to the bifurcation. Atherosclerotic change in the ICA bulb, primarily calcified. Minimal diameter is 3.5 mm, consistent with 30% stenosis. Cervical ICA is widely patent beyond that. Vertebral arteries: 50% irregular stenosis of the proximal left subclavian artery. Atherosclerotic calcification at both vertebral arteries and proximal vertebral arteries. Detail limited by shoulder density. Stenosis estimated at 50-70% on both sides. Beyond that, the vessels show some atherosclerotic change in the cervical region but are patent to the foramen magnum. Severe stenosis of the distal left vertebral artery at the foramen magnum, estimated at 80%. Skeleton: Ordinary spondylosis Other neck: No significant soft tissue lesion. Upper chest: Negative Review of the MIP images confirms the above findings CTA HEAD FINDINGS Anterior circulation: Internal carotid artery is patent through the siphon region. There is extensive atherosclerotic calcification in the carotid siphon region with stenosis estimated at 50-70%. Supraclinoid ICA is patent. There is flow in both the right middle cerebral artery and right anterior cerebral artery. Left ICA is patent through the siphon region. Again there is extensive calcification with stenosis estimated at 50-70%. Anterior and middle cerebral vessels are patent proximally. The left anterior cerebral artery is occluded or nearly occluded 1.5 cm beyond its origin. Some diminished  flow beyond that could be collateral or reconstituted flow.  Posterior circulation: Right vertebral artery is occluded after Pike up. As noted above, 80% stenosis of the left vertebral artery at the foramen magnum level. Beyond that, the vessel is small and irregular but does reach the basilar. No basilar stenosis. Superior cerebellar and posterior cerebral vessels show flow. Venous sinuses: Patent and normal Anatomic variants: None significant Delayed phase: No abnormal enhancement Review of the MIP images confirms the above findings IMPRESSION: Low-density now visible in the region of acute infarction affecting the left corpus callosum and cingulate gyrus. No hemorrhage. 50% irregular stenosis of the proximal left subclavian artery. 30% stenosis in the proximal ICA on both sides. 50% stenosis of the right cervical ICA just beneath the skullbase. Atherosclerotic disease at both proximal vertebral arteries worse on the right than the left. Stenoses in those regions estimated at 50-70% bilaterally. 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 There is some flow more distal to that within the left ACA, which could be reconstituted or collateral. 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 Posterior circulation branch vessels show flow. Electronically Signed   By: Nelson Chimes M.D.   On: 07/02/2016 20:35   Mr Brain Wo Contrast  Result Date: 07/01/2016 CLINICAL DATA:  66 y/o M; acute weakness, altered mental status, and multiple falls with concern for acute stroke. EXAM: MRI HEAD WITHOUT CONTRAST TECHNIQUE: Multiplanar, multiecho pulse sequences of the brain and surrounding structures were obtained without intravenous contrast. COMPARISON:  06/29/2016 CT head.  01/03/2008 MRI  head. FINDINGS: Brain: Diffusion restriction within the left aspect of corpus callosum extending into left cingulate gyrus in paramedian frontal lobe compatible with acute/ early subacute infarction. 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 Chronic infarct within the left hemi pons. Moderate diffuse supratentorial and infratentorial brain parenchymal volume loss. Background of mild chronic microvascular ischemic changes in white matter. No hydrocephalus. No extra-axial collection. No abnormal susceptibility hypointensity to indicate intracranial hemorrhage. Vascular: Normal flow voids. Skull and upper cervical spine: Normal marrow signal. Sinuses/Orbits: Mild diffuse paranasal sinus mucosal thickening greatest in the maxillary sinuses. No abnormal signal of the mastoid air cells. Orbits are unremarkable. Other: None. IMPRESSION: 1. Diffusion restriction within the left aspect of corpus callosum extending into left cingulate gyrus in paramedian frontal lobe compatible with acute/ early subacute infarction, left ACA distribution. No acute hemorrhage identified. 2. Mild chronic microvascular ischemic changes and moderate diffuse brain parenchymal volume loss. Chronic left pons infarct. 3. Mild diffuse paranasal sinus disease. These results will be called to the ordering clinician or representative by the Radiologist Assistant, and communication documented in the PACS or zVision Dashboard. Electronically Signed   By: Kristine Garbe M.D.   On: 07/01/2016 14:57     PERTINENT LAB RESULTS: CBC:  Recent Labs  07/03/16 0345  WBC 11.4*  HGB 11.2*  HCT 34.6*  PLT 148*   CMET CMP     Component Value Date/Time   NA 138 07/03/2016 0345   K 3.9 07/03/2016 0345   CL 107 07/03/2016 0345   CO2 22 07/03/2016 0345   GLUCOSE 117 (H) 07/03/2016 0345   BUN 10 07/03/2016  0345    CREATININE 0.86 07/03/2016 0345   CREATININE 1.02 11/05/2015 1203   CALCIUM 8.4 (L) 07/03/2016 0345   PROT 6.9 06/29/2016 2346   ALBUMIN 3.7 06/29/2016 2346   AST 70 (H) 06/29/2016 2346   ALT 122 (H) 06/29/2016 2346   ALKPHOS 89 06/29/2016 2346   BILITOT 0.9 06/29/2016 2346   GFRNONAA >60 07/03/2016 0345   GFRNONAA 77 11/05/2015 1203   GFRAA >60 07/03/2016 0345   GFRAA 89 11/05/2015 1203    GFR Estimated Creatinine Clearance: 101.9 mL/min (by C-G formula based on SCr of 0.86 mg/dL). No results for input(s): LIPASE, AMYLASE in the last 72 hours. No results for input(s): CKTOTAL, CKMB, CKMBINDEX, TROPONINI in the last 72 hours. Invalid input(s): POCBNP No results for input(s): DDIMER in the last 72 hours.  Recent Labs  07/03/16 0752  HGBA1C 6.9*    Recent Labs  07/04/16 0322  CHOL 91  HDL 22*  LDLCALC 60  TRIG 43  CHOLHDL 4.1   No results for input(s): TSH, T4TOTAL, T3FREE, THYROIDAB in the last 72 hours.  Invalid input(s): FREET3 No results for input(s): VITAMINB12, FOLATE, FERRITIN, TIBC, IRON, RETICCTPCT in the last 72 hours. Coags: No results for input(s): INR in the last 72 hours.  Invalid input(s): PT Microbiology: Recent Results (from the past 240 hour(s))  Culture, blood (x 2)     Status: None (Preliminary result)   Collection Time: 06/29/16 11:30 PM  Result Value Ref Range Status   Specimen Description BLOOD RIGHT HAND  Final   Special Requests IN PEDIATRIC BOTTLE 3CC  Final   Culture   Final    NO GROWTH 3 DAYS Performed at [facility], [address] [place], [place] [address]    Report Status PENDING  Incomplete  Culture, blood (x 2)     Status: None (Preliminary result)   Collection Time: 06/29/16 11:35 PM  Result Value Ref Range Status   Specimen Description BLOOD RIGHT ANTECUBITAL  Final   Special Requests BOTTLES DRAWN AEROBIC AND ANAEROBIC 5CC EACH  Final   Culture   Final    NO GROWTH 3 DAYS Performed at [facility],  [place] [address] [place], [place] [address]    Report Status PENDING  Incomplete  Urine culture     Status: None   Collection Time: 06/30/16  2:19 AM  Result Value Ref Range Status   Specimen Description URINE, RANDOM  Final   Special Requests NONE  Final   Culture   Final    NO GROWTH Performed at [facility], [address], [place],  [address]    Report Status 07/01/2016 FINAL  Final  C difficile quick scan w PCR reflex     Status: None   Collection Time: 06/30/16  7:02 AM  Result Value Ref Range Status   C Diff antigen NEGATIVE NEGATIVE Final   C Diff toxin NEGATIVE NEGATIVE Final   C Diff interpretation No C. difficile detected.  Final  Gastrointestinal Panel by PCR , Stool     Status: None   Collection Time: 06/30/16  7:02 AM  Result Value Ref Range Status   Campylobacter species NOT DETECTED NOT DETECTED Final   Plesimonas shigelloides NOT DETECTED NOT DETECTED Final   Salmonella species NOT DETECTED NOT DETECTED Final   Yersinia enterocolitica NOT DETECTED NOT DETECTED Final   Vibrio species NOT DETECTED NOT DETECTED Final   Vibrio cholerae NOT DETECTED NOT DETECTED Final   Enteroaggregative E  coli (EAEC) NOT DETECTED NOT DETECTED Final   Enteropathogenic E coli (EPEC) NOT DETECTED NOT DETECTED Final   Enterotoxigenic E coli (ETEC) NOT DETECTED NOT DETECTED Final   Shiga like toxin producing E coli (STEC) NOT DETECTED NOT DETECTED Final   Shigella/Enteroinvasive E coli (EIEC) NOT DETECTED NOT DETECTED Final   Cryptosporidium NOT DETECTED NOT DETECTED Final   Cyclospora cayetanensis NOT DETECTED NOT DETECTED Final   Entamoeba histolytica NOT DETECTED NOT DETECTED Final   Giardia lamblia NOT DETECTED NOT DETECTED Final   Adenovirus F40/41 NOT DETECTED NOT DETECTED Final   Astrovirus NOT DETECTED NOT DETECTED Final   Norovirus GI/GII NOT DETECTED NOT DETECTED Final   Rotavirus A NOT DETECTED NOT DETECTED Final   Sapovirus (I, II, IV, and V) NOT DETECTED NOT  DETECTED Final    FURTHER DISCHARGE INSTRUCTIONS:  Get Medicines reviewed and adjusted: Please take all your medications with you for your next visit with your Primary MD  Laboratory/radiological data: Please request your Primary MD to go over all hospital tests and procedure/radiological results at the follow up, please ask your Primary MD to get all Hospital records sent to his/her office.  In some cases, they will be blood work, cultures and biopsy results pending at the time of your discharge. Please request that your primary care M.D. goes through all the records of your hospital data and follows up on these results.  Also Note the following: If you experience worsening of your admission symptoms, develop shortness of breath, life threatening emergency, suicidal or homicidal thoughts you must seek medical attention immediately by calling 911 or calling your MD immediately  if symptoms less severe.  You must read complete instructions/literature along with all the possible adverse reactions/side effects for all the Medicines you take and that have been prescribed to you. Take any new Medicines after you have completely understood and accpet all the possible adverse reactions/side effects.   Do not drive when taking Pain medications or sleeping medications (Benzodaizepines)  Do not take more than prescribed Pain, Sleep and Anxiety Medications. It is not advisable to combine anxiety,sleep and pain medications without talking with your primary care practitioner  Special Instructions: If you have smoked or chewed Tobacco  in the last 2 yrs please stop smoking, stop any regular Alcohol  and or any Recreational drug use.  Wear Seat belts while driving.  Please note: You were cared for by a hospitalist during your hospital stay. Once you are discharged, your primary care physician will handle any further medical issues. Please note that NO REFILLS for any discharge medications will be  authorized once you are discharged, as it is imperative that you return to your primary care physician (or establish a relationship with a primary care physician if you do not have one) for your post hospital discharge needs so that they can reassess your need for medications and monitor your lab values.  Total Time spent coordinating discharge including counseling, education and face to face time equals 45 minutes.  SignedOren Binet 07/04/2016 8:54 AM

## 2016-07-04 NOTE — Progress Notes (Signed)
Rehab admissions - Dr. Naaman Plummer saw patient today and he has approved inpatient rehab admission for today.  I called the daughter and she is very agreeable to inpatient rehab stay.  Bed available and will admit to acute inpatient rehab today.  Call me for questions.  CK:[phone]

## 2016-07-04 NOTE — PMR Pre-admission (Signed)
PMR Admission Coordinator Pre-Admission Assessment  Patient: [name] is an 66 y.o., male MRN: [id number] DOB: 04-May-1951 Height: 5\' 7"  (170.2 cm) Weight: 111.1 kg (245 lb)              Insurance Information HMO: No  PPO:       PCP:       IPA:       80/20:       OTHER:   PRIMARY:  Medicare A/B      Policy#:  [id number]      Subscriber:  Krista Blue CM Name:        Phone#:       Fax#:   Pre-Cert#:        Employer: Retired Benefits:  Phone #:       Name: Checked in [place]. Date: 07/03/10     Deduct: $1340      Out of Pocket Max: none      Life Max: unlimited CIR: 100%      SNF: 100 days Outpatient: 80%     Co-Pay: 20% Home Health: 100%      Co-Pay: none DME: 80%     Co-Pay: 20% Providers: patient's choice  Medicaid Application Date:        Case Manager:   Disability Application Date:        Case Worker:    Emergency Contact Information Contact Information    Name Relation Home Work Mobile   Auston,Sophona "[name]" Daughter [phone]     Levonte, Rambin Other [phone]       Current Medical History  Patient Admitting Diagnosis: L ACA infarct  History of Present Illness: A 66 y.o.right handed malewith history of hypertension, diabetes mellitus, CKD stage II, 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 Walks with a quad cane. He uses [employer] transportation. Daughter in the area works.Presented 06/30/2016 with increasing lethargy over the past 3 days and multiple falls with altered mental status. Reports of diarrhea for the past few days, WBC 23,000, sodium 132. Cranial CT scan negative. Chest x-ray with no acute disease. C. difficile specimen negative. Protocol initiated for suspect sepsis. MRI showed diffusion restriction within the left aspect of corpus colostomy extending into the left cingulate gyrus in the paramedian left frontal lobe compatible with acute early subacute infarct, left ACA  distribution. 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 Occlusion or near occlusion the left anterior cerebral artery 1.5 cm beyond its origin. Echocardiogram with ejection fraction of 123456 grade 2 diastolic dysfunction. No wall motion abnormalities. Neurology consult at present maintain on aspirin 325 mg daily as well as Plavix for CVA prophylaxis3 months then Plavix alone. Subcutaneous Lovenox for DVT prophylaxis. WBC trending down 15,700. Tolerating a regular consistency diet. Physical and occupational therapy evaluations completed. M.D. has requested physical medicine rehabilitation consult. Patient to be admitted for a comprehensive inpatient rehabilitation program.    Total: 7=NIH  Past Medical History  Past Medical History:  Diagnosis Date  . 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 CVA (cerebral infarction) 01/03/2008   MRI: Acute 1 x 1.5 cm infarction affecting the left side of the pons.  . Diabetes mellitus   . DVT (deep venous thrombosis) ([place])   . Hypertension   . PAD (peripheral artery disease) ([place])   . Stroke (  [place])    2008    Family History  family history includes Diabetes in his mother and sister; Heart attack in his mother; Hypertension in his brother, daughter, mother, sister, and son.  Prior Rehab/Hospitalizations: No recent rehab.  Patient tells me that previous CVA was in 2009.  Has the patient had major surgery during 100 days prior to admission? No  Current Medications   Current Facility-Administered Medications:  .  acetaminophen (TYLENOL) tablet 650 mg, 650 mg, Oral, Q6H PRN **OR** acetaminophen (TYLENOL) suppository 650 mg, 650 mg, Rectal, Q6H PRN, Ivor Costa, MD .  aspirin tablet 325 mg, 325 mg, Oral, Daily, Doreatha Lew, MD, 325 mg at 07/04/16 1005 .  atorvastatin (LIPITOR) tablet 40 mg, 40 mg,  Oral, QPM, Ivor Costa, MD, 40 mg at 07/03/16 1803 .  clopidogrel (PLAVIX) tablet 75 mg, 75 mg, Oral, Q breakfast, Ivor Costa, MD, 75 mg at 07/04/16 1005 .  enoxaparin (LOVENOX) injection 40 mg, 40 mg, Subcutaneous, Q24H, Ivor Costa, MD, 40 mg at 07/04/16 1005 .  hydrALAZINE (APRESOLINE) injection 5 mg, 5 mg, Intravenous, Q2H PRN, Doreatha Lew, MD .  insulin aspart (novoLOG) injection 0-5 Units, 0-5 Units, Subcutaneous, QHS, Ivor Costa, MD, 2 Units at 07/03/16 2317 .  insulin aspart (novoLOG) injection 0-9 Units, 0-9 Units, Subcutaneous, TID WC, Ivor Costa, MD, 1 Units at 07/04/16 1225 .  insulin detemir (LEVEMIR) injection 30 Units, 30 Units, Subcutaneous, Daily, Doreatha Lew, MD, 30 Units at 07/04/16 1005 .  ondansetron (ZOFRAN) tablet 4 mg, 4 mg, Oral, Q6H PRN **OR** ondansetron (ZOFRAN) injection 4 mg, 4 mg, Intravenous, Q6H PRN, Ivor Costa, MD .  sertraline (ZOLOFT) tablet 100 mg, 100 mg, Oral, Daily, Ivor Costa, MD, 100 mg at 07/04/16 1005 .  sodium chloride flush (NS) 0.9 % injection 3 mL, 3 mL, Intravenous, Q12H, Ivor Costa, MD, 3 mL at 07/04/16 1000  Patients Current Diet: Diet Carb Modified Fluid consistency: Thin; Room service appropriate? Yes Diet - low sodium heart healthy Diet Carb Modified  Precautions / Restrictions Precautions Precautions: Fall Precaution Comments: Rt hemiparesis Restrictions Weight Bearing Restrictions: No   Has the patient had 2 or more falls or a fall with injury in the past year?Yes.  Patient reports 2 falls with no injury.  Prior Activity Level Limited Community (1-2x/wk): Went out 1 X a week.  Home Assistive Devices / Equipment Home Assistive Devices/Equipment: Cane (specify quad or straight) (quad) Home Equipment: Cane - quad, Bedside commode, Tub bench  Prior Device Use: Indicate devices/aids used by the patient prior to current illness, exacerbation or injury? Quad cane  Prior Functional Level Prior Function Level of Independence: Needs  assistance Gait / Transfers Assistance Needed: walks with quad cane without assist on flat surfaces ADL's / Homemaking Assistance Needed: Pt states he bathes and dresses himself, does simple cooking.  Family helps with cleaning and brings groceries.  Uses [employer] for transportation.  Self Care: Did the patient need help bathing, dressing, using the toilet or eating?  Independent  Indoor Mobility: Did the patient need assistance with walking from room to room (with or without device)? Independent  Stairs: Did the patient need assistance with internal or external stairs (with or without device)? Independent  Functional Cognition: Did the patient need help planning regular tasks such as shopping or remembering to take medications? Independent.  Family does assist with cleaning, getting groceries and paying bills.  Current Functional Level Cognition  Overall Cognitive Status: History of cognitive impairments - at baseline Difficult  to assess due to: Impaired communication Orientation Level: Oriented X4 General Comments: Pt does fairly well cognitively, alert and oriented following all commands.  Pt is slow to process and with decreased problem solving skillls.      Extremity Assessment (includes Sensation/Coordination)  Upper Extremity Assessment: RUE deficits/detail, Defer to OT evaluation RUE Deficits / Details: Uses as gross assist.  High tone.  Shoulder PROM to 90, painful to fully extend elbow and fingers.   RUE: Unable to fully assess due to pain RUE Coordination: decreased fine motor, decreased gross motor  Lower Extremity Assessment: RLE deficits/detail RLE Deficits / Details: No movement noted RLE.  Noted increased tone. RLE Coordination: decreased gross motor    ADLs  Overall ADL's : Needs assistance/impaired Eating/Feeding: Set up, Sitting Grooming: Sitting, Minimal assistance Grooming Details (indicate cue type and reason): assist donning deodorant. Upper Body Bathing: Minimal  assistance, Sitting Lower Body Bathing: Moderate assistance, Sit to/from stand Upper Body Dressing : Minimal assistance, Sitting Lower Body Dressing: Moderate assistance, Sit to/from stand Toilet Transfer: Minimal assistance, +2 for safety/equipment, BSC (quad cane) Toileting- Clothing Manipulation and Hygiene: Minimal assistance, Sit to/from stand, Sitting/lateral lean Functional mobility during ADLs: Minimal assistance, +2 for safety/equipment, Cueing for sequencing, Cueing for safety (quad cane) General ADL Comments: Pt overall not steady on feet and requires several cues for safety with quad cane when up on his feet.  Pt has difficulty reaching his feet.  Wears slip on bedroom shoes most of the time which may not be the best option given his RLE weakness.    Mobility  Overal bed mobility: Needs Assistance Bed Mobility: Supine to Sit Rolling: Mod assist Sidelying to sit: Max assist, HOB elevated Supine to sit: Min guard, HOB elevated Sit to sidelying: Mod assist, HOB elevated General bed mobility comments: Pt able to get to EOB with min/guard with increased time    Transfers  Overall transfer level: Needs assistance Equipment used: 2 person hand held assist Transfers: Sit to/from Stand Sit to Stand: Mod assist, +2 physical assistance Stand pivot transfers: Mod assist, +2 physical assistance General transfer comment: Pt able to take small little steps with MOD A of 2 to recliner for lunch    Ambulation / Gait / Stairs / Wheelchair Mobility  Ambulation/Gait Ambulation/Gait assistance: Min assist, +2 physical assistance, +2 safety/equipment Ambulation Distance (Feet): 30 Feet Assistive device: Quad cane Gait Pattern/deviations: Step-to pattern, Decreased step length - right, Decreased stance time - right, Shuffle, Trunk flexed, Antalgic General Gait Details: deferred. Lunch had arrived Gait velocity: decr    Posture / Balance Balance Overall balance assessment: Needs  assistance Sitting-balance support: No upper extremity supported, Feet supported Sitting balance-Leahy Scale: Fair Standing balance support: Single extremity supported Standing balance-Leahy Scale: Poor Standing balance comment: Pt must have outside support to remain standing safely    Special needs/care consideration BiPAP/CPAP No CPM No Continuous Drip IV No Dialysis No        Life Vest No Oxygen No Special Bed No Trach Size No Wound Vac (area) No      Skin                             Bowel mgmt: Last BM 07/04/15 Bladder mgmt: Voiding in urinal with no incontinence Diabetic mgmt Yes, on oral medications and insulin at home.   Previous Home Environment Living Arrangements: Alone  Lives With: Alone, Other (Comment) (family that assists PRN) Available Help at Discharge: Family,  Available PRN/intermittently Type of Home: House Home Layout: One level Home Access: Level entry Bathroom Shower/Tub: Tub/shower unit, Architectural technologist: Standard Bathroom Accessibility: Yes How Accessible: Other (comment) (quad cane) Home Care Services: No  Discharge Living Setting Plans for Discharge Living Setting: Patient's home, Alone, House (Lives alone.) Type of Home at Discharge: House Discharge Home Layout: One level Discharge Home Access: Ramped entrance (Patient tells me he has a ramp.) Does the patient have any problems obtaining your medications?: No  Social/Family/Support Systems Patient Roles: Parent (Has a daughter and 2 sons.) Contact Information: Laurens Sahai - daughter - [phone] Anticipated Caregiver: Family Ability/Limitations of Caregiver: Dtr works. Caregiver Availability: Other (Comment) (Daughter aware of need for 24/7 care/supervision.) Discharge Plan Discussed with Primary Caregiver: Yes Is Caregiver In Agreement with Plan?: Yes Does Caregiver/Family have Issues with Lodging/Transportation while Pt is in Rehab?: No  Goals/Additional Needs Patient/Family  Goal for Rehab: PT/OT min to mod assist, SLP supervision to min assist goals Expected length of stay: 20-28 days Cultural Considerations: None Dietary Needs: Carb mod, med cal, thin liquids Equipment Needs: TBD Pt/Family Agrees to Admission and willing to participate: Yes Program Orientation Provided & Reviewed with Pt/Caregiver Including Roles  & Responsibilities: Yes  Decrease burden of Care through IP rehab admission: N/A  Possible need for SNF placement upon discharge: Yes, if patient cannot progress to level where family can manage him at home will need SNF.  Patient Condition: This patient's condition remains as documented in the consult dated 07/03/16, in which the Rehabilitation Physician determined and documented that the patient's condition is appropriate for intensive rehabilitative care in an inpatient rehabilitation facility. Will admit to inpatient rehab today.  Preadmission Screen Completed By:  Retta Diones, 07/04/2016 1:48 PM ______________________________________________________________________   Discussed status with Dr. Naaman Plummer on 07/04/16 at 1350 and received telephone approval for admission today.  Admission Coordinator:  Retta Diones, time1350/Date2/2/18

## 2016-07-04 NOTE — Progress Notes (Signed)
Report given to nurse, Luis on 4W. Pt to be transferred to 4W10.

## 2016-07-04 NOTE — Care Management Note (Signed)
Case Management Note  Patient Details  Name: [name] MRN: [id number] Date of Birth: Sep 21, 1950  Subjective/Objective:                    Action/Plan: Pt discharging to CIR today. No further needs per CM.   Expected Discharge Date:  07/04/16               Expected Discharge Plan:  IP Rehab Facility  In-House Referral:  Clinical Social Work  Discharge planning Services  CM Consult  Post Acute Care Choice:    Choice offered to:     DME Arranged:    DME Agency:     HH Arranged:    [place] Agency:     Status of Service:  Completed, signed off  If discussed at [employer], dates discussed:    Additional Comments:  Pollie Friar, RN 07/04/2016, 2:58 PM

## 2016-07-05 ENCOUNTER — Inpatient Hospital Stay (HOSPITAL_COMMUNITY): Payer: Medicare Other | Admitting: Physical Therapy

## 2016-07-05 ENCOUNTER — Inpatient Hospital Stay (HOSPITAL_COMMUNITY): Payer: Medicare Other | Admitting: Occupational Therapy

## 2016-07-05 ENCOUNTER — Inpatient Hospital Stay (HOSPITAL_COMMUNITY): Payer: Medicare Other | Admitting: Speech Pathology

## 2016-07-05 DIAGNOSIS — I69320 Aphasia following cerebral infarction: Secondary | ICD-10-CM

## 2016-07-05 DIAGNOSIS — I69351 Hemiplegia and hemiparesis following cerebral infarction affecting right dominant side: Principal | ICD-10-CM

## 2016-07-05 DIAGNOSIS — I679 Cerebrovascular disease, unspecified: Secondary | ICD-10-CM

## 2016-07-05 LAB — CULTURE, BLOOD (ROUTINE X 2)
CULTURE: NO GROWTH
Culture: NO GROWTH

## 2016-07-05 NOTE — Progress Notes (Signed)
Subjective/Complaints: Patient without complaints today, states he slept well. Has not had any therapy yet.  Review systems negative chest pain. Negative shortness of breath. Negative nausea, vomiting, diarrhea, constipation  Objective: Vital Signs: Blood pressure (!) 175/55, pulse (!) 59, temperature 98.5 F (36.9 C), temperature source Oral, resp. rate 18, height 5' 7"  (1.702 m), weight 110.3 kg (243 lb 1.6 oz), SpO2 98 %. No results found. Results for orders placed or performed during the hospital encounter of 07/04/16 (from the past 72 hour(s))  CBC     Status: Abnormal   Collection Time: 07/04/16  6:46 PM  Result Value Ref Range   WBC 15.7 (H) 4.0 - 10.5 K/uL   RBC 4.04 (L) 4.22 - 5.81 MIL/uL   Hemoglobin 12.1 (L) 13.0 - 17.0 g/dL   HCT 36.9 (L) 39.0 - 52.0 %   MCV 91.3 78.0 - 100.0 fL   MCH 30.0 26.0 - 34.0 pg   MCHC 32.8 30.0 - 36.0 g/dL   RDW 12.2 11.5 - 15.5 %   Platelets 154 150 - 400 K/uL  Creatinine, serum     Status: None   Collection Time: 07/04/16  6:46 PM  Result Value Ref Range   Creatinine, Ser 0.99 0.61 - 1.24 mg/dL   GFR calc non Af Amer >60 >60 mL/min   GFR calc Af Amer >60 >60 mL/min    Comment: (NOTE) The eGFR has been calculated using the CKD EPI equation. This calculation has not been validated in all clinical situations. eGFR's persistently <60 mL/min signify possible Chronic Kidney Disease.      General: No acute distress Mood and affect are appropriate Heart: Regular rate and rhythm no rubs murmurs or extra sounds Lungs: Clear to auscultation, breathing unlabored, no rales or wheezes Abdomen: Positive bowel sounds, soft nontender to palpation, nondistended Extremities: No clubbing, cyanosis, or edema Skin: No evidence of breakdown, no evidence of rash Neurologic: Cranial nerves II through XII intact, motor strength is 5/5 in left deltoid, bicep, tricep, grip, hip flexor, knee extensors, ankle dorsiflexor and plantar flexor 2 minus in the  right deltoid, bicep, tricep, trace finger flexors, trace hip flexor 2 minus, knee extensor, trace ankle dorsiflexor  Cerebellar exam normal finger to nose to finger as well as heel to shin in left upper and lower extremities, unable to perform due to motor weakness on the right side Musculoskeletal: Full range of motion in all 4 extremities. No joint swelling   Assessment/Plan: 1. Functional deficits secondary to left ACA infarct with right hemiparesis which require 3+ hours per day of interdisciplinary therapy in a comprehensive inpatient rehab setting. Physiatrist is providing close team supervision and 24 hour management of active medical problems listed below. Physiatrist and rehab team continue to assess barriers to discharge/monitor patient progress toward functional and medical goals. FIM:                   Function - Comprehension Comprehension: Auditory Comprehension assist level: Understands basic 50 - 74% of the time/ requires cueing 25 - 49% of the time  Function - Expression Expression: Verbal Expression assist level: Expresses basic 50 - 74% of the time/requires cueing 25 - 49% of the time. Needs to repeat parts of sentences.  Function - Social Interaction Social Interaction assist level: Interacts appropriately 50 - 74% of the time - May be physically or verbally inappropriate.  Function - Problem Solving Problem solving assist level: Solves basic 50 - 74% of the time/requires cueing 25 - 49%  of the time  Function - Memory Memory assist level: Recognizes or recalls 50 - 74% of the time/requires cueing 25 - 49% of the time Patient normally able to recall (first 3 days only): Current season  Medical Problem List and Plan: 1. Altered mental status, history of spastic right hemiparesis and multiple fallssecondary to left ACA infarct with history of left cortical infarct with spastic right hemiparesis -CIR PT, OT, speech eval's today 2. DVT  Prophylaxis/Anticoagulation: Subcutaneous Lovenox. Monitor platelet counts and any signs of bleeding 3. Pain Management: Tylenol as needed -consider baclofen trial for spasticity 4. Mood: Zoloft 100 mg daily 5. Neuropsych: This patient iscapable of making decisions on hisown behalf. 6. Skin/Wound Care: Routine skin checks 7. Fluids/Electrolytes/Nutrition: Routine I&O with follow-up chemistries 8.Diabetes mellitus of peripheral neuropathy. Hemoglobin A1c 6.9.Levemir 30 units daily. Check blood sugars before meals and at bedtime. Diabetic teaching Last 3 readings have been in a good range CBG (last 3)   Recent Labs  07/04/16 0637 07/04/16 1138 07/04/16 1647  GLUCAP 102* 129* 138*    9.CRI stage II. Follow-up chemistries upon admit. 10.Hypertension. Permissive hypertension. Patient on Coreg 25 mg twice a day, HCTZ 25 mg daily, lisinopril 40 mg daily, Aldactone 50 mg daily, Norvasc 10 mg daily, Revatio 60 mg daily prior to admission. Resume amlodipine 5 mg tomorrow, we'll slowly titrate other meds as needed Vitals:   07/04/16 1717 07/05/16 0450  BP: (!) 185/59 (!) 175/55  Pulse: (!) 59 (!) 59  Resp:  18  Temp: 98.5 F (36.9 C) 98.5 F (36.9 C)   11.Hyperlipidemia. Lipitor 12.Question medical compliance. Provide education  LOS (Days) 1 A FACE TO FACE EVALUATION WAS PERFORMED  Delvin Hedeen E 07/05/2016, 11:29 AM

## 2016-07-05 NOTE — Evaluation (Signed)
Physical Therapy Assessment and Plan  Patient Details  Name: [name] MRN: [id number] Date of Birth: 1950/11/22  PT Diagnosis: Difficulty walking, Hemiparesis dominant, Impaired sensation and Muscle weakness Rehab Potential: Good ELOS: 18 to 21   Today's Date: 07/05/2016 PT Individual Time: 1000-1100 PT Individual Time Calculation (min): 60 min    Problem List:  Patient Active Problem List   Diagnosis Date Noted  . Cerebrovascular small vessel disease 07/04/2016  . Aphasia due to recent cerebral infarction 07/04/2016  . Cerebrovascular accident (CVA) ([place])   . Altered mental status 06/30/2016  . Fall 06/30/2016  . Acute encephalopathy 06/30/2016  . Sepsis ([place]) 06/30/2016  . Diarrhea 06/30/2016  . Acute renal failure superimposed on stage 2 chronic kidney disease ([place]) 06/30/2016  . Hemiparesis affecting right side as late effect of stroke ([place]) 10/23/2015  . PAD (peripheral artery disease) ([place]) 10/20/2011  . Hyperlipidemia 02/21/2010  . CEREBROVASCULAR ACCIDENT, HX OF 12/22/2008  . Diabetes mellitus, type II ([place]) 07/30/2006  . OBESITY, NOS 07/30/2006  . Essential hypertension, benign 07/30/2006    Past Medical History:  Past Medical History:  Diagnosis Date  . 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 CVA (cerebral infarction) 01/03/2008   MRI: Acute 1 x 1.5 cm infarction affecting the left side of the pons.  . Diabetes mellitus   . DVT (deep venous thrombosis) ([place])   . Hypertension   . PAD (peripheral artery disease) ([place])   . Stroke [facility])    2008   Past Surgical History:  Past Surgical History:  Procedure Laterality Date  . None      Assessment & Plan Clinical Impression: Patient is a 66 y.o.right handed malewith history of hypertension, diabetes mellitus, CKD stage II, 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 Walks with a quad cane. He uses [employer]for transportation. Daughter  in the area question cyst.Presented 06/30/2016 with increasing lethargy over the past 3 days and multiple falls with altered mental status. Reports of diarrhea for the past few days, WBC 23,000, sodium 132. Cranial CT scan negative. Chest x-ray with no acute disease. C. difficile specimen negative. Protocol initiated for suspect sepsis. MRI showed diffusion restriction within the left aspect of corpus colostomy extending into the left cingulate gyrus in the paramedian left frontal lobe compatible with acute early subacute infarct, left ACA distribution. 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 Occlusion or near occlusion the left anterior cerebral artery 1.5 cm beyond its origin. Echocardiogram with ejection fraction of 78% grade 2 diastolic dysfunction. No wall motion abnormalities. Neurology consult at present maintain on aspirin 325 mg daily as well as Plavix for CVA prophylaxis3 months then Plavix alone. Subcutaneous Lovenox for DVT prophylaxis. WBC trending down 15,700. Tolerating a regular consistency diet. Physical and occupational therapy evaluations completed. M.D. has requested physical medicine rehabilitation consult.Patient was admitted for a comprehensive rehabilitation program.  Patient transferred to CIR on 07/04/2016 .   Patient currently requires mod with mobility secondary to muscle weakness, decreased cardiorespiratoy endurance and ataxia and decreased coordination.  Prior to hospitalization, patient was modified independent  with mobility and lived with Alone in a House home.  Home access is  Level entry.  Patient will benefit from skilled PT intervention to maximize safe functional mobility, minimize fall risk and decrease caregiver burden for planned discharge  home alone.  Anticipate patient will benefit from follow up [place] at  discharge.  PT - End of Session Activity Tolerance: Tolerates 30+ min activity with multiple rests Endurance Deficit: Yes PT Assessment Rehab Potential (ACUTE/IP ONLY): Good Barriers to Discharge: Decreased caregiver support PT Patient demonstrates impairments in the following area(s): Balance;Endurance;Motor PT Transfers Functional Problem(s): Bed Mobility;Bed to Chair;Car PT Locomotion Functional Problem(s): Ambulation;Stairs PT Plan PT Intensity: Minimum of 1-2 x/day ,45 to 90 minutes PT Frequency: 5 out of 7 days PT Duration Estimated Length of Stay: 18 to 21 PT Treatment/Interventions: Ambulation/gait training;Balance/vestibular training;Discharge planning;Functional electrical stimulation;DME/adaptive equipment instruction;Functional mobility training;Neuromuscular re-education;Patient/family education;Stair training;Therapeutic Activities;Therapeutic Exercise;UE/LE Strength taining/ROM;UE/LE Coordination activities PT Transfers Anticipated Outcome(s): mod I transfers PT Locomotion Anticipated Outcome(s): mod I gait, S stairs PT Recommendation Follow Up Recommendations: Home health PT Patient destination: Home Equipment Recommended: To be determined  Skilled Therapeutic Intervention PT evaluation completed and treatment plan initiated. Pt performed multiple sit to stand transfers with fluctuating assistance from min to mod A with verbal cues. Pt ambulated 10 feet with WBQC and min A with verbal cues. Pt returned to room following treatment. Pt transferred w/c to recliner with mod A and verbal cues. Pt left sitting up with call bell within reach.   PT Evaluation Precautions/Restrictions Precautions Precautions: Fall Precaution Comments: Rt hemiparesis Restrictions Weight Bearing Restrictions: No General Chart Reviewed: Yes Family/Caregiver Present: No  Pain Pain Assessment Pain Assessment: No/denies pain Home Living/Prior Functioning Home Living Available Help at  Discharge: Family;Available PRN/intermittently (assists with groceries) Type of Home: House Home Access: Level entry Home Layout: One level Prior Function Level of Independence: Independent with gait;Independent with transfers;Requires assistive device for independence  Able to Take Stairs?: Yes Driving: No Comments: used [employer] transportation in the community Vision/Perception  As per OT evaluation.  Cognition Overall Cognitive Status: History of cognitive impairments - at baseline Arousal/Alertness: Awake/alert Orientation Level: Oriented X4 Memory: Appears intact Awareness: Appears intact Problem Solving: Appears intact Safety/Judgment: Appears intact Sensation Sensation Light Touch: Appears Intact Proprioception: Impaired by gross assessment (R LE) Coordination Gross Motor Movements are Fluid and Coordinated: No Fine Motor Movements are Fluid and Coordinated: No Motor  Motor Motor: Other (comment);Ataxia (R hemiparesis)  Mobility Bed Mobility Bed Mobility: Rolling Left;Supine to Sit;Sit to Supine Rolling Left: 4: Min assist Supine to Sit: 4: Min assist Sit to Supine: 5: Supervision Transfers Transfers: Yes Sit to Stand: 3: Mod assist Stand to Sit: 3: Mod assist Stand Pivot Transfers: 3: Mod assist Locomotion  Ambulation Ambulation: Yes Ambulation/Gait Assistance: 3: Mod assist Ambulation Distance (Feet): 10 Feet Assistive device: Large base quad cane Stairs / Additional Locomotion Stairs: Yes Stair Management Technique: Two rails Number of Stairs: 1 Height of Stairs: 6 Ramp: 2: Max Chemical engineer: Yes Wheelchair Assistance: 4: Min Lexicographer: Left upper extremity;Left lower extremity Distance: 25  Trunk/Postural Assessment  Cervical Assessment Cervical Assessment: Exceptions to [facility] Thoracic Assessment Thoracic Assessment: Exceptions to [facility] Lumbar Assessment Lumbar Assessment: Exceptions to [facility] Postural  Control Postural Control: Deficits on evaluation  Balance Balance Balance Assessed: Yes Static Sitting Balance Static Sitting - Balance Support: Feet supported Static Sitting - Level of Assistance: 5: Stand by assistance Static Standing Balance Static Standing - Balance Support: During functional activity Static Standing - Level of Assistance: 3: Mod assist Dynamic Standing Balance Dynamic Standing - Balance Support: During functional activity Dynamic Standing - Level of Assistance: 3: Mod assist;2: Max assist Extremity Assessment B UEs as per OT evaluation.  RLE Assessment RLE Assessment: Exceptions to [facility] RLE PROM (degrees) Overall PROM Right Lower Extremity: Within functional limits for tasks assessed RLE Strength RLE Overall Strength: Deficits RLE Overall Strength Comments: grossly 2/5 except ankle 0/5-1/5 LLE Assessment LLE Assessment: Within Functional Limits   See Function Navigator for Current Functional Status.   Refer to Care Plan for Long Term Goals  Recommendations for other services: None   Discharge Criteria: Patient will be discharged from PT if patient refuses treatment 3 consecutive times without medical reason, if treatment goals not met, if there is a change in medical status, if patient makes no progress towards goals or if patient is discharged from hospital.  The above assessment, treatment plan, treatment alternatives and goals were discussed and mutually agreed upon: by patient  Dub Amis 07/05/2016, 12:47 PM

## 2016-07-05 NOTE — Evaluation (Signed)
Speech Language Pathology Assessment and Plan  Patient Details  Name: [name] MRN: [id number] Date of Birth: 1950/08/06  SLP Diagnosis: Cognitive Impairments;Dysphagia;Dysarthria  Rehab Potential: Good ELOS: 2-3 weeks    Today's Date: 07/05/2016 SLP Individual Time: 1300-1400 SLP Individual Time Calculation (min): 60 min   Problem List:  Patient Active Problem List   Diagnosis Date Noted  . Cerebrovascular small vessel disease 07/04/2016  . Aphasia due to recent cerebral infarction 07/04/2016  . Cerebrovascular accident (CVA) ([place])   . Altered mental status 06/30/2016  . Fall 06/30/2016  . Acute encephalopathy 06/30/2016  . Sepsis ([place]) 06/30/2016  . Diarrhea 06/30/2016  . Acute renal failure superimposed on stage 2 chronic kidney disease ([place]) 06/30/2016  . Hemiparesis affecting right side as late effect of stroke ([place]) 10/23/2015  . PAD (peripheral artery disease) ([place]) 10/20/2011  . Hyperlipidemia 02/21/2010  . CEREBROVASCULAR ACCIDENT, HX OF 12/22/2008  . Diabetes mellitus, type II ([place]) 07/30/2006  . OBESITY, NOS 07/30/2006  . Essential hypertension, benign 07/30/2006   Past Medical History:  Past Medical History:  Diagnosis Date  . 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 CVA (cerebral infarction) 01/03/2008   MRI: Acute 1 x 1.5 cm infarction affecting the left side of the pons.  . Diabetes mellitus   . DVT (deep venous thrombosis) ([place])   . Hypertension   . PAD (peripheral artery disease) ([place])   . Stroke [facility])    2008   Past Surgical History:  Past Surgical History:  Procedure Laterality Date  . None      Assessment / Plan / Recommendation Clinical Impression [name] a 66 y.o.right handed malewith history of hypertension, diabetes mellitus, CKD stage II, 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 Walks with a quad cane. He uses [employer]for transportation.  Daughter in the area question cyst.Presented 06/30/2016 with increasing lethargy over the past 3 days and multiple falls with altered mental status. Reports of diarrhea for the past few days, WBC 23,000, sodium 132. Cranial CT scan negative. Chest x-ray with no acute disease. C. difficile specimen negative. Protocol initiated for suspect sepsis.MRI showed diffusion restriction within the left aspect of corpus colostomy extending into the left cingulate gyrus in the paramedian left frontal lobe compatible with acute early subacute infarct, left ACA distribution. 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 Occlusion or near occlusion the left anterior cerebral artery 1.5 cm beyond its origin. Echocardiogram with ejection fraction of 08% grade 2 diastolic dysfunction. No wall motion abnormalities. Neurology consult at present maintain on aspirin 325 mg daily as well as Plavix for CVA prophylaxis3 months then Plavix alone. Subcutaneous Lovenox for DVT prophylaxis. WBC trending down 15,700.Tolerating a regular consistency diet.Physical and occupational therapy evaluations completed. M.D. has requested physical medicine rehabilitation consult. Patient was admitted for a comprehensive rehabilitation program 07/04/16.  Bedside swallow and cognitive-linguistic evaluations complete.  Patient demonstrates mild right sided lingual weakness and decreased range of motion resulting in mild right-sided buccal pocketing with regular textures and moderate-severe imprecise consonant productions resulting in significantly reduced intelligibility with expression of basic wants and needs.  Patient also demonstrates moderate cognitive impairments characterized by poor emergent awareness of deficits and safety awareness which impact the patient's overall safety with functional self-care  tasks. Patient also demonstrates poor self-monitoring and correcting of errors  with mildly complex problem solving. While baseline functioning is unclear, patient would benefit from skilled SLP intervention in order to maximize his functional independence prior to discharge. Anticipate patient will require 24 hour supervision at home and follow up SLP services.    Skilled Therapeutic Interventions          Evaluation completed with results and recommendations reviewed with patient.  SLP also initiated skilled intervention with education and Max assist verbal and visual cues to produce /l/ at the word level while reading a list of L-words.     SLP Assessment  Patient will need skilled Speech Lanaguage Pathology Services during CIR admission    Recommendations  SLP Diet Recommendations: Age appropriate regular solids;Thin Liquid Administration via: Cup;Straw Medication Administration: Whole meds with liquid Supervision: Patient able to self feed;Intermittent supervision to cue for compensatory strategies Compensations: Slow rate;Small sips/bites;Lingual sweep for clearance of pocketing Postural Changes and/or Swallow Maneuvers: Out of bed for meals;Seated upright 90 degrees Oral Care Recommendations: Oral care BID Patient destination: Home Follow up Recommendations: 24 hour supervision/assistance;Outpatient SLP Equipment Recommended: None recommended by SLP    SLP Frequency 3 to 5 out of 7 days   SLP Duration  SLP Intensity  SLP Treatment/Interventions 2-3 weeks  Minumum of 1-2 x/day, 30 to 90 minutes  Cognitive remediation/compensation;Cueing hierarchy;Dysphagia/aspiration precaution training;Functional tasks;Internal/external aids;Medication managment;Patient/family education;Speech/Language facilitation;Therapeutic Exercise;Therapeutic Activities    Pain Pain Assessment Pain Assessment: No/denies pain  Prior Functioning Cognitive/Linguistic Baseline: Baseline deficits Baseline  deficit details: WFL for basic daily tasks, but pt reports that his daughter provided help with financed, med management, etc. Type of Home: House  Lives With: Alone Available Help at Discharge: Family;Available PRN/intermittently  Function:  Eating Eating   Modified Consistency Diet: No Eating Assist Level: Set up assist for;Helper checks for pocketed food   Eating Set Up Assist For: Opening containers;Cutting food       Cognition Comprehension Comprehension assist level: Follows basic conversation/direction with extra time/assistive device  Expression   Expression assist level: Expresses basic 50 - 74% of the time/requires cueing 25 - 49% of the time. Needs to repeat parts of sentences.  Social Interaction Social Interaction assist level: Interacts appropriately 75 - 89% of the time - Needs redirection for appropriate language or to initiate interaction.  Problem Solving Problem solving assist level: Solves basic 75 - 89% of the time/requires cueing 10 - 24% of the time  Memory Memory assist level: Recognizes or recalls 75 - 89% of the time/requires cueing 10 - 24% of the time   Short Term Goals: Week 1: SLP Short Term Goal 1 (Week 1): Patient will request help as needed for safety with completion of basic self-care tasks with Min assist question cues.  SLP Short Term Goal 2 (Week 1): Patient will self-monitor and correct errors during mildly complex problem solving tasks with Min assist question cues. SLP Short Term Goal 3 (Week 1): Patient will produce final sounds of words during sentence level verbal expression tasks with Mod assist mulimodal cues. SLP Short Term Goal 4 (Week 1): Patient will produce /l/ in all positions of words wtih Mod assist verbal and visual cues to elevate tongue.  SLP Short Term Goal 5 (Week 1): Patient will utilize lingual sweep to manage right sided buccal pocketing Mod I.   Refer to Care Plan for Long Term Goals  Recommendations for other services:  None   Discharge Criteria: Patient will be discharged from SLP if patient refuses treatment 3 consecutive times without medical  reason, if treatment goals not met, if there is a change in medical status, if patient makes no progress towards goals or if patient is discharged from hospital.  The above assessment, treatment plan, treatment alternatives and goals were discussed and mutually agreed upon: by patient  Gunnar Fusi, M.A., CCC-SLP [phone]  [place] 07/05/2016, 2:29 PM

## 2016-07-05 NOTE — Evaluation (Signed)
Occupational Therapy Assessment and Plan  Patient Details  Name: [name] MRN: [id number] Date of Birth: 06/11/1950  OT Diagnosis: abnormal posture, altered mental status, hemiplegia affecting dominant side and muscle weakness (generalized) Rehab Potential: Rehab Potential (ACUTE ONLY): Good ELOS: 2.5- 3 weeks   Today's Date: 07/05/2016 OT Individual Time: 3557-3220 OT Individual Time Calculation (min): 75 min     Problem List:  Patient Active Problem List   Diagnosis Date Noted  . Cerebrovascular small vessel disease 07/04/2016  . Aphasia due to recent cerebral infarction 07/04/2016  . Cerebrovascular accident (CVA) ([place])   . Altered mental status 06/30/2016  . Fall 06/30/2016  . Acute encephalopathy 06/30/2016  . Sepsis ([place]) 06/30/2016  . Diarrhea 06/30/2016  . Acute renal failure superimposed on stage 2 chronic kidney disease ([place]) 06/30/2016  . Hemiparesis affecting right side as late effect of stroke ([place]) 10/23/2015  . PAD (peripheral artery disease) ([place]) 10/20/2011  . Hyperlipidemia 02/21/2010  . CEREBROVASCULAR ACCIDENT, HX OF 12/22/2008  . Diabetes mellitus, type II ([place]) 07/30/2006  . OBESITY, NOS 07/30/2006  . Essential hypertension, benign 07/30/2006    Past Medical History:  Past Medical History:  Diagnosis Date  . 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 CVA (cerebral infarction) 01/03/2008   MRI: Acute 1 x 1.5 cm infarction affecting the left side of the pons.  . Diabetes mellitus   . DVT (deep venous thrombosis) ([place])   . Hypertension   . PAD (peripheral artery disease) ([place])   . Stroke [facility])    2008   Past Surgical History:  Past Surgical History:  Procedure Laterality Date  . None      Assessment & Plan Clinical Impression: [name] a 66 y.o.right handed malewith history of hypertension, diabetes mellitus, CKD stage II, CVA 2008 with residual right sided weakness maintained on aspirin and Plavix.Per chart review  lives alone. Walks with a quad cane. He uses [employer]for transportation. Daughter in the area question cyst.Presented 06/30/2016 with increasing lethargy over the past 3 days and multiple falls with altered mental status. Reports of diarrhea for the past few days, WBC 23,000, sodium 132. Cranial CT scan negative. Chest x-ray with no acute disease. C. difficile specimen negative. Protocol initiated for suspect sepsis.MRI showed diffusion restriction within the left aspect of corpus colostomy extending into the left cingulate gyrus in the paramedian left frontal lobe compatible with acute early subacute infarct, left ACA distribution. 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 Occlusion or near occlusion the left anterior cerebral artery 1.5 cm beyond its origin. Echocardiogram with ejection fraction of 25% grade 2 diastolic dysfunction. No wall motion abnormalities. Neurology consult at present maintain on aspirin 325 mg daily as well as Plavix for CVA prophylaxis3 months then Plavix alone. Subcutaneous Lovenox for DVT prophylaxis. WBC trending down 15,700.Tolerating a regular consistency diet.Physical and occupational therapy evaluations completed. M.D. has requested physical medicine rehabilitation consult.Patient was admitted for a comprehensive rehabilitation program.  Patient transferred to CIR on 07/04/2016 .    Patient currently requires max with basic self-care skills secondary to muscle weakness and muscle paralysis, decreased cardiorespiratoy endurance, abnormal tone, unbalanced muscle activation, motor apraxia, ataxia, decreased coordination and decreased motor planning, decreased initiation and decreased sitting balance, decreased standing balance, decreased postural control, hemiplegia and decreased balance strategies.  Prior to hospitalization, patient  could complete ADLs with modified  independent .  Patient will benefit from skilled intervention to decrease level of assist with basic self-care skills, increase independence with basic self-care skills and increase level of independence with iADL prior to discharge home independently.  Anticipate patient will require intermittent supervision and follow up home health.  OT - End of Session Activity Tolerance: Tolerates 10 - 20 min activity with multiple rests Endurance Deficit: Yes OT Assessment Rehab Potential (ACUTE ONLY): Good Barriers to Discharge: Decreased caregiver support Barriers to Discharge Comments: Lives alone, reports family can only provide intermitent assist.  OT Patient demonstrates impairments in the following area(s): Balance;Cognition;Endurance;Sensory;Safety OT Basic ADL's Functional Problem(s): Grooming;Eating;Bathing;Dressing;Toileting OT Advanced ADL's Functional Problem(s): Simple Meal Preparation;Light Housekeeping OT Transfers Functional Problem(s): Toilet;Tub/Shower OT Additional Impairment(s): Fuctional Use of Upper Extremity OT Plan OT Intensity: Minimum of 1-2 x/day, 45 to 90 minutes OT Frequency: 5 out of 7 days OT Duration/Estimated Length of Stay: 2.5 weeks OT Treatment/Interventions: Balance/vestibular training;Discharge planning;DME/adaptive equipment instruction;Functional mobility training;Neuromuscular re-education;Patient/family education;Psychosocial support;Self Care/advanced ADL retraining;Therapeutic Activities;Therapeutic Exercise;UE/LE Strength taining/ROM;UE/LE Coordination activities OT Self Feeding Anticipated Outcome(s): Mod I OT Basic Self-Care Anticipated Outcome(s): Mod I OT Toileting Anticipated Outcome(s): Mod I OT Bathroom Transfers Anticipated Outcome(s): Supervision- min A OT Recommendation Patient destination: Home Follow Up Recommendations: Home health OT Equipment Details: Pt has tub transfer bench   Skilled Therapeutic  Intervention Pt seen for OT eval and ADL bathing/dressing session. Pt in supine upon arrival having just received breakfast tray. Pt agreeable to tx session. He transferred to EOB and ate breakfast with set-up assist to open containers and supervision for unsupported sitting balance.  Stand pivots completed throughout session with mod-max A and max VCs for foot placement/ sequencing of steps and assist for weightshift to advance R LE. Bathing completed seated at sink, standing to have pants pulled up.  Pt desiring to sit in recliner at end of session. Transferred via stand pivot in same manner as described above. Pt left seated in recliner with al needs in reach. Educated regarding use of call be and need for assist with mobility.  Pt educated throughout session regarding role of OT,POC, and d/c planning.   OT Evaluation Precautions/Restrictions  Precautions Precautions: Fall Precaution Comments: Rt hemiparesis Restrictions Weight Bearing Restrictions: No General Chart Reviewed: Yes Additional Pertinent History: Hx of R hemiperesis Pain   No/ denies pain Home Living/Prior Functioning Home Living Family/patient expects to be discharged to:: Private residence Living Arrangements: Alone Available Help at Discharge: Family, Available PRN/intermittently (Family checks in weekly and assists with groceries) Type of Home: House Home Access: Level entry Home Layout: One level Bathroom Shower/Tub: Tub/shower unit, Architectural technologist: Programmer, systems: Yes  Lives With: Alone IADL History Homemaking Responsibilities: Yes Current License: No Mode of Transportation: Other (comment) ([employer]) Prior Function Level of Independence: Independent with basic ADLs, Requires assistive device for independence, Other (comment) (Used quad cane PTA)  Able to Take Stairs?: Yes Driving: No Vision/Perception  Vision- History Baseline Vision/History: Wears glasses Wears Glasses: Reading  only Patient Visual Report: No change from baseline  Cognition Overall Cognitive Status: History of cognitive impairments - at baseline Arousal/Alertness: Awake/alert Orientation Level: Person;Place;Situation Person: Oriented Place: Oriented Situation: Oriented Year: 2018 Month: February Day of Week: Correct Memory: Appears intact Immediate Memory Recall: Sock;Blue;Bed Memory Recall: Sock;Blue;Bed Memory Recall Sock: Without Cue Memory Recall Blue: Without Cue Memory Recall Bed: Without Cue Awareness: Appears intact Problem Solving: Appears intact Safety/Judgment: Appears intact Sensation Sensation Light Touch: Appears Intact Coordination Gross Motor Movements are Fluid and  Coordinated: No Fine Motor Movements are Fluid and Coordinated: No Coordination and Movement Description: LE apraxia; R hemi Finger Nose Finger Test: Intact with L UE Motor  Motor Motor: Other (comment);Ataxia (R hemiparesis) Trunk/Postural Assessment  Cervical Assessment Cervical Assessment: Exceptions to [facility] (Forward head) Thoracic Assessment Thoracic Assessment: Exceptions to [facility] (Rounded shoulders) Lumbar Assessment Lumbar Assessment: Exceptions to [facility] (Posterior pelvic tilt) Postural Control Postural Control: Deficits on evaluation (Generalized weakness with R hemiperesis)  Balance Balance Balance Assessed: (P) Yes Extremity/Trunk Assessment RUE Assessment RUE Assessment: Exceptions to [facility] (Hx R hemiplegia. Pt reports UE is as baseline level, however difficult to determine. Trace wrist and elbow flexion; minimal gross grasping; 90 degree shoulder flexion) LUE Assessment LUE Assessment: Within Functional Limits   See Function Navigator for Current Functional Status.   Refer to Care Plan for Long Term Goals  Recommendations for other services: None    Discharge Criteria: Patient will be discharged from OT if patient refuses treatment 3 consecutive times without medical reason, if  treatment goals not met, if there is a change in medical status, if patient makes no progress towards goals or if patient is discharged from hospital.  The above assessment, treatment plan, treatment alternatives and goals were discussed and mutually agreed upon: by patient  Ernestina Patches 07/05/2016, 9:48 AM

## 2016-07-06 ENCOUNTER — Other Ambulatory Visit: Payer: Self-pay | Admitting: Family Medicine

## 2016-07-06 ENCOUNTER — Inpatient Hospital Stay (HOSPITAL_COMMUNITY): Payer: Medicare Other | Admitting: Physical Therapy

## 2016-07-06 MED ORDER — AMLODIPINE BESYLATE 5 MG PO TABS
5.0000 mg | ORAL_TABLET | Freq: Every day | ORAL | Status: DC
  Administered 2016-07-06 – 2016-07-18 (×13): 5 mg via ORAL
  Filled 2016-07-06 (×13): qty 1

## 2016-07-06 MED ORDER — METFORMIN HCL 500 MG PO TABS
500.0000 mg | ORAL_TABLET | Freq: Every day | ORAL | Status: DC
  Administered 2016-07-07 – 2016-07-08 (×2): 500 mg via ORAL
  Filled 2016-07-06 (×2): qty 1

## 2016-07-06 NOTE — Progress Notes (Signed)
Subjective/Complaints: Patient without complaints today, states he slept well. RN is administering medications, blood sugars have not been entered into the Epic program, elevations above 250 during the day yesterday. Morning blood sugars are good, 96 and 124, last 2 mornings  Review systems negative chest pain. Negative shortness of breath. Negative nausea, vomiting, diarrhea, constipation  Objective: Vital Signs: Blood pressure (!) 160/60, pulse 66, temperature 98.5 F (36.9 C), temperature source Oral, resp. rate 18, height _0  (1.702 m), weight 109.9 kg (242 lb 4.8 oz), SpO2 98 %. No results found. Results for orders placed or performed during the hospital encounter of 07/04/16 (from the past 72 hour(s))  CBC     Status: Abnormal   Collection Time: 07/04/16  6:46 PM  Result Value Ref Range   WBC 15.7 (H) 4.0 - 10.5 K/uL   RBC 4.04 (L) 4.22 - 5.81 MIL/uL   Hemoglobin 12.1 (L) 13.0 - 17.0 g/dL   HCT 36.9 (L) 39.0 - 52.0 %   MCV 91.3 78.0 - 100.0 fL   MCH 30.0 26.0 - 34.0 pg   MCHC 32.8 30.0 - 36.0 g/dL   RDW 12.2 11.5 - 15.5 %   Platelets 154 150 - 400 K/uL  Creatinine, serum     Status: None   Collection Time: 07/04/16  6:46 PM  Result Value Ref Range   Creatinine, Ser 0.99 0.61 - 1.24 mg/dL   GFR calc non Af Amer >60 >60 mL/min   GFR calc Af Amer >60 >60 mL/min    Comment: (NOTE) The eGFR has been calculated using the CKD EPI equation. This calculation has not been validated in all clinical situations. eGFR's persistently <60 mL/min signify possible Chronic Kidney Disease.      General: No acute distress Mood and affect are appropriate Heart: Regular rate and rhythm no rubs murmurs or extra sounds Lungs: Clear to auscultation, breathing unlabored, no rales or wheezes Abdomen: Positive bowel sounds, soft nontender to palpation, nondistended Extremities: No clubbing, cyanosis, or edema Skin: No evidence of breakdown, no evidence of rash Neurologic: Cranial nerves II  through XII intact, motor strength is 5/5 in left deltoid, bicep, tricep, grip, hip flexor, knee extensors, ankle dorsiflexor and plantar flexor 2 minus in the right deltoid, bicep, tricep, trace finger flexors, trace hip flexor 2 minus, knee extensor, trace ankle dorsiflexor  Cerebellar exam normal finger to nose to finger as well as heel to shin in left upper and lower extremities, unable to perform due to motor weakness on the right side Musculoskeletal: Full range of motion in all 4 extremities. No joint swelling   Assessment/Plan: 1. Functional deficits secondary to left ACA infarct with right hemiparesis which require 3+ hours per day of interdisciplinary therapy in a comprehensive inpatient rehab setting. Physiatrist is providing close team supervision and 24 hour management of active medical problems listed below. Physiatrist and rehab team continue to assess barriers to discharge/monitor patient progress toward functional and medical goals. FIM: Function - Bathing Position: Wheelchair/chair at sink Body parts bathed by patient: Left arm, Chest, Abdomen, Front perineal area, Right upper leg, Left upper leg, Right lower leg, Left lower leg Body parts bathed by helper: Right arm, Buttocks, Back Assist Level: Touching or steadying assistance(Pt > 75%)  Function- Upper Body Dressing/Undressing What is the patient wearing?: Pull over shirt/dress Pull over shirt/dress - Perfomed by helper: Thread/unthread right sleeve, Thread/unthread left sleeve, Put head through opening, Pull shirt over trunk Function - Lower Body Dressing/Undressing What is the patient wearing?:  Pants, Non-skid slipper socks Position: Wheelchair/chair at sink Pants- Performed by helper: Thread/unthread right pants leg, Thread/unthread left pants leg, Pull pants up/down Non-skid slipper socks- Performed by helper: Don/doff right sock, Don/doff left sock Assist for footwear: Maximal assist Assist for lower body  dressing: Touching or steadying assistance (Pt > 75%)  Function - Toileting Toileting activity did not occur: No continent bowel/bladder event (using urinal, no BM) Toileting steps completed by helper: Adjust clothing prior to toileting, Performs perineal hygiene, Adjust clothing after toileting Toileting Assistive Devices: Grab bar or rail  Function - Air cabin crew transfer activity did not occur: N/A (no BM, uses urinal ) Toilet transfer assistive device: Grab bar Assist level to toilet: Maximal assist (Pt 25 - 49%/lift and lower) Assist level from toilet: Maximal assist (Pt 25 - 49%/lift and lower)  Function - Chair/bed transfer Chair/bed transfer method: Stand pivot Chair/bed transfer assist level: Moderate assist (Pt 50 - 74%/lift or lower) Chair/bed transfer assistive device: Armrests  Function - Locomotion: Wheelchair Will patient use wheelchair at discharge?: No Type: Manual Max wheelchair distance: 25 Assist Level: Touching or steadying assistance (Pt > 75%) Wheel 50 feet with 2 turns activity did not occur: Safety/medical concerns Wheel 150 feet activity did not occur: Safety/medical concerns Function - Locomotion: Ambulation Assistive device: Cane-quad Max distance: 10 Assist level: Moderate assist (Pt 50 - 74%) Assist level: Moderate assist (Pt 50 - 74%) Walk 50 feet with 2 turns activity did not occur: Safety/medical concerns Walk 150 feet activity did not occur: Safety/medical concerns Walk 10 feet on uneven surfaces activity did not occur: Safety/medical concerns  Function - Comprehension Comprehension: Auditory Comprehension assist level: Follows basic conversation/direction with extra time/assistive device  Function - Expression Expression: Verbal Expression assist level: Expresses basic 50 - 74% of the time/requires cueing 25 - 49% of the time. Needs to repeat parts of sentences.  Function - Social Interaction Social Interaction assist level:  Interacts appropriately 75 - 89% of the time - Needs redirection for appropriate language or to initiate interaction.  Function - Problem Solving Problem solving assist level: Solves basic 75 - 89% of the time/requires cueing 10 - 24% of the time  Function - Memory Memory assist level: Recognizes or recalls 75 - 89% of the time/requires cueing 10 - 24% of the time Patient normally able to recall (first 3 days only): Current season, That he or she is in a hospital  Medical Problem List and Plan: 1. Altered mental status, history of spastic right hemiparesis and multiple fallssecondary to left ACA infarct with history of left cortical infarct with spastic right hemiparesis -CIR PT, OT, speech eval's today 2. DVT Prophylaxis/Anticoagulation: Subcutaneous Lovenox. Monitor platelet counts and any signs of bleeding 3. Pain Management: Tylenol as needed -consider baclofen trial for spasticity 4. Mood: Zoloft 100 mg daily 5. Neuropsych: This patient iscapable of making decisions on hisown behalf. 6. Skin/Wound Care: Routine skin checks 7. Fluids/Electrolytes/Nutrition: Routine I&O with follow-up chemistries 8.Diabetes mellitus of peripheral neuropathy. Hemoglobin A1c 6.9.Levemir 30 units daily. Check blood sugars before meals and at bedtime. Diabetic teaching, good a.m. CBGs, add metformin 500 mg with breakfast to help with daytime  CBG (last 3) 124, 258, 283   9.CRI stage II. Follow-up chemistries upon admit. 10.Hypertension. Permissive hypertension. Patient on Coreg 25 mg twice a day, HCTZ 25 mg daily, lisinopril 40 mg daily, Aldactone 50 mg daily, Norvasc 10 mg daily, Revatio 60 mg daily prior to admission. Resume amlodipine 5 mg today we'll slowly titrate other  meds as needed Vitals:   07/05/16 1500 07/06/16 0407  BP: (!) 141/51 (!) 160/60  Pulse: 64 66  Resp: 16 18  Temp: 98.6 F (37 C) 98.5 F (36.9 C)   11.Hyperlipidemia. Lipitor 12.Question  medical compliance. Provide education  LOS (Days) 2 A FACE TO FACE EVALUATION WAS PERFORMED  KIRSTEINS,ANDREW E 07/06/2016, 11:12 AM

## 2016-07-06 NOTE — Progress Notes (Signed)
CBG at HS-258 requiring 3 units of Novolog. This Am CBG-124. [name] A

## 2016-07-06 NOTE — Progress Notes (Signed)
Physical Therapy Session Note  Patient Details  Name: [name] MRN: [id number] Date of Birth: 1951-04-24  Today's Date: 07/06/2016 PT Individual Time: 1300-1400 PT Individual Time Calculation (min): 60 min   Short Term Goals: Week 1:  PT Short Term Goal 1 (Week 1): Pt will increase rolling with no rails to c/s. PT Short Term Goal 2 (Week 1): Pt will increase transfers in bed to S.  PT Short Term Goal 3 (Week 1): Pt will increase transfers to min A.  PT Short Term Goal 4 (Week 1): Pt will increase gait with WBQC to min A about 50 feet. PT Short Term Goal 5 (Week 1): Pt will ascend/descend 4 stairs with 2 rails and mod A.   Skilled Therapeutic Interventions/Progress Updates:  Pt was seen bedside in the pm. Pt propelled w/c about 150 feet with L UE and LE, required S and increased time. Pt performed multiple sit to stand transfers with min A and verbal cues. Pt propelled w/c about 50 with L UE and LE and S. Pt ambulated 70 feet x 2 with WBQC and min A with verbal cues. Pt performed blocked practice 5 STS transfers with min A and verbal cues. Pt propelled w/c back to room with S and increased time. Pt left sitting up in w/c with call bell within reach and quick release belt in place.   Therapy Documentation Precautions:  Precautions Precautions: Fall Precaution Comments: Rt hemiparesis Restrictions Weight Bearing Restrictions: No General:   Pain: No c/o pain.   See Function Navigator for Current Functional Status.   Therapy/Group: Individual Therapy  Dub Amis 07/06/2016, 3:38 PM

## 2016-07-07 ENCOUNTER — Inpatient Hospital Stay (HOSPITAL_COMMUNITY): Payer: Medicare Other | Admitting: Occupational Therapy

## 2016-07-07 ENCOUNTER — Inpatient Hospital Stay (HOSPITAL_COMMUNITY): Payer: Medicare Other | Admitting: Physical Therapy

## 2016-07-07 ENCOUNTER — Inpatient Hospital Stay (HOSPITAL_COMMUNITY): Payer: Medicare Other | Admitting: Speech Pathology

## 2016-07-07 LAB — GLUCOSE, CAPILLARY
GLUCOSE-CAPILLARY: 96 mg/dL (ref 65–99)
GLUCOSE-CAPILLARY: 283 mg/dL — AB (ref 65–99)
GLUCOSE-CAPILLARY: 258 mg/dL — AB (ref 65–99)
GLUCOSE-CAPILLARY: 164 mg/dL — AB (ref 65–99)
GLUCOSE-CAPILLARY: 196 mg/dL — AB (ref 65–99)
GLUCOSE-CAPILLARY: 120 mg/dL — AB (ref 65–99)
Glucose-Capillary: 287 mg/dL — ABNORMAL HIGH (ref 65–99)
Glucose-Capillary: 220 mg/dL — ABNORMAL HIGH (ref 65–99)
Glucose-Capillary: 124 mg/dL — ABNORMAL HIGH (ref 65–99)
Glucose-Capillary: 218 mg/dL — ABNORMAL HIGH (ref 65–99)
Glucose-Capillary: 91 mg/dL (ref 65–99)

## 2016-07-07 LAB — CBC WITH DIFFERENTIAL/PLATELET
BASOS PCT: 1 %
Basophils Absolute: 0.1 10*3/uL (ref 0.0–0.1)
EOS ABS: 0.4 10*3/uL (ref 0.0–0.7)
Eosinophils Relative: 3 %
HCT: 34.9 % — ABNORMAL LOW (ref 39.0–52.0)
Hemoglobin: 11.2 g/dL — ABNORMAL LOW (ref 13.0–17.0)
LYMPHS ABS: 2.8 10*3/uL (ref 0.7–4.0)
Lymphocytes Relative: 23 %
MCH: 29.3 pg (ref 26.0–34.0)
MCHC: 32.1 g/dL (ref 30.0–36.0)
MCV: 91.4 fL (ref 78.0–100.0)
MONO ABS: 0.9 10*3/uL (ref 0.1–1.0)
MONOS PCT: 7 %
Neutro Abs: 8.3 10*3/uL — ABNORMAL HIGH (ref 1.7–7.7)
Neutrophils Relative %: 66 %
Platelets: 159 10*3/uL (ref 150–400)
RBC: 3.82 MIL/uL — ABNORMAL LOW (ref 4.22–5.81)
RDW: 12.2 % (ref 11.5–15.5)
WBC: 12.5 10*3/uL — ABNORMAL HIGH (ref 4.0–10.5)

## 2016-07-07 LAB — COMPREHENSIVE METABOLIC PANEL
ALBUMIN: 2.4 g/dL — AB (ref 3.5–5.0)
ALT: 46 U/L (ref 17–63)
ANION GAP: 3 — AB (ref 5–15)
AST: 36 U/L (ref 15–41)
Alkaline Phosphatase: 50 U/L (ref 38–126)
BILIRUBIN TOTAL: 0.9 mg/dL (ref 0.3–1.2)
BUN: 13 mg/dL (ref 6–20)
CALCIUM: 8.3 mg/dL — AB (ref 8.9–10.3)
CO2: 28 mmol/L (ref 22–32)
Chloride: 106 mmol/L (ref 101–111)
Creatinine, Ser: 0.97 mg/dL (ref 0.61–1.24)
GFR calc non Af Amer: >60 mL/min (ref >60)
GLUCOSE: 101 mg/dL — AB (ref 65–99)
POTASSIUM: 4.1 mmol/L (ref 3.5–5.1)
Sodium: 137 mmol/L (ref 135–145)
TOTAL PROTEIN: 5.7 g/dL — AB (ref 6.5–8.1)

## 2016-07-07 NOTE — Progress Notes (Signed)
Physical Therapy Session Note  Patient Details  Name: [name] MRN: [id number] Date of Birth: 27-Aug-1950  Today's Date: 07/07/2016 PT Individual Time: [id number] PT Individual Time Calculation (min): 30 min   Short Term Goals: Week 1:  PT Short Term Goal 1 (Week 1): Pt will increase rolling with no rails to c/s. PT Short Term Goal 2 (Week 1): Pt will increase transfers in bed to S.  PT Short Term Goal 3 (Week 1): Pt will increase transfers to min A.  PT Short Term Goal 4 (Week 1): Pt will increase gait with WBQC to min A about 50 feet. PT Short Term Goal 5 (Week 1): Pt will ascend/descend 4 stairs with 2 rails and mod A.   Skilled Therapeutic Interventions/Progress Updates: Pt presented in w/c agreeable to therapy. Propelled to rehab gym with supervision. Performed stand pivot transfer w/c to mat x 4 with min guard, demonstrating good safety. Performed sit to stand with UE support from mat x 5 with min cues for decreased LE bracing against mat, with improved technique.  Gait with LBQC approx 61ft with narrow BoS and min cues for RLE placement. No LOB noted. Performed step ups x 5 on 6in step. Pt propelled self back to room and remained in w/c with QRB in place and call bell within reach.      Therapy Documentation Precautions:  Precautions Precautions: Fall Precaution Comments: Rt hemiparesis Restrictions Weight Bearing Restrictions: No General:   Vital Signs: Therapy Vitals Temp: 98.1 F (36.7 C) Temp Source: Oral Pulse Rate: 67 Resp: 18 BP: (!) 153/48 Patient Position (if appropriate): Sitting Oxygen Therapy SpO2: 98 % O2 Device: Not Delivered   See Function Navigator for Current Functional Status.   Therapy/Group: Individual Therapy  Shiya Fogelman  Jarry Manon, PTA  07/07/2016, 3:44 PM

## 2016-07-07 NOTE — Progress Notes (Signed)
Subjective/Complaints: Patient seen during speech therapy evaluation. Patient has good yes no responses. Follows simple commands. Remains aphasic. At this point. Difficult to say whether he has speech apraxia, but does not appear to be any issues with this. Swallowing with minor restrictions  Review systems negative chest pain. Negative shortness of breath. Negative nausea, vomiting, diarrhea, constipation  Objective: Vital Signs: Blood pressure (!) 161/50, pulse (!) 55, temperature 98 F (36.7 C), temperature source Oral, resp. rate 18, height 5' 7" (1.702 m), weight 109 kg (240 lb 6.4 oz), SpO2 98 %. No results found. Results for orders placed or performed during the hospital encounter of 07/04/16 (from the past 72 hour(s))  CBC     Status: Abnormal   Collection Time: 07/04/16  6:46 PM  Result Value Ref Range   WBC 15.7 (H) 4.0 - 10.5 K/uL   RBC 4.04 (L) 4.22 - 5.81 MIL/uL   Hemoglobin 12.1 (L) 13.0 - 17.0 g/dL   HCT 36.9 (L) 39.0 - 52.0 %   MCV 91.3 78.0 - 100.0 fL   MCH 30.0 26.0 - 34.0 pg   MCHC 32.8 30.0 - 36.0 g/dL   RDW 12.2 11.5 - 15.5 %   Platelets 154 150 - 400 K/uL  Creatinine, serum     Status: None   Collection Time: 07/04/16  6:46 PM  Result Value Ref Range   Creatinine, Ser 0.99 0.61 - 1.24 mg/dL   GFR calc non Af Amer >60 >60 mL/min   GFR calc Af Amer >60 >60 mL/min    Comment: (NOTE) The eGFR has been calculated using the CKD EPI equation. This calculation has not been validated in all clinical situations. eGFR's persistently <60 mL/min signify possible Chronic Kidney Disease.   CBC WITH DIFFERENTIAL     Status: Abnormal   Collection Time: 07/07/16  6:40 AM  Result Value Ref Range   WBC 12.5 (H) 4.0 - 10.5 K/uL   RBC 3.82 (L) 4.22 - 5.81 MIL/uL   Hemoglobin 11.2 (L) 13.0 - 17.0 g/dL   HCT 34.9 (L) 39.0 - 52.0 %   MCV 91.4 78.0 - 100.0 fL   MCH 29.3 26.0 - 34.0 pg   MCHC 32.1 30.0 - 36.0 g/dL   RDW 12.2 11.5 - 15.5 %   Platelets 159 150 - 400 K/uL    Neutrophils Relative % 66 %   Neutro Abs 8.3 (H) 1.7 - 7.7 K/uL   Lymphocytes Relative 23 %   Lymphs Abs 2.8 0.7 - 4.0 K/uL   Monocytes Relative 7 %   Monocytes Absolute 0.9 0.1 - 1.0 K/uL   Eosinophils Relative 3 %   Eosinophils Absolute 0.4 0.0 - 0.7 K/uL   Basophils Relative 1 %   Basophils Absolute 0.1 0.0 - 0.1 K/uL  Comprehensive metabolic panel     Status: Abnormal   Collection Time: 07/07/16  6:40 AM  Result Value Ref Range   Sodium 137 135 - 145 mmol/L   Potassium 4.1 3.5 - 5.1 mmol/L   Chloride 106 101 - 111 mmol/L   CO2 28 22 - 32 mmol/L   Glucose, Bld 101 (H) 65 - 99 mg/dL   BUN 13 6 - 20 mg/dL   Creatinine, Ser 0.97 0.61 - 1.24 mg/dL   Calcium 8.3 (L) 8.9 - 10.3 mg/dL   Total Protein 5.7 (L) 6.5 - 8.1 g/dL   Albumin 2.4 (L) 3.5 - 5.0 g/dL   AST 36 15 - 41 U/L   ALT 46 17 - 63  U/L   Alkaline Phosphatase 50 38 - 126 U/L   Total Bilirubin 0.9 0.3 - 1.2 mg/dL   GFR calc non Af Amer >60 >60 mL/min   GFR calc Af Amer >60 >60 mL/min    Comment: (NOTE) The eGFR has been calculated using the CKD EPI equation. This calculation has not been validated in all clinical situations. eGFR's persistently <60 mL/min signify possible Chronic Kidney Disease.    Anion gap 3 (L) 5 - 15     General: No acute distress Mood and affect are appropriate Heart: Regular rate and rhythm no rubs murmurs or extra sounds Lungs: Clear to auscultation, breathing unlabored, no rales or wheezes Abdomen: Positive bowel sounds, soft nontender to palpation, nondistended Extremities: No clubbing, cyanosis, or edema Skin: No evidence of breakdown, no evidence of rash Neurologic: Cranial nerves II through XII intact, motor strength is 5/5 in left deltoid, bicep, tricep, grip, hip flexor, knee extensors, ankle dorsiflexor and plantar flexor 2 minus in the right deltoid, bicep, tricep, trace finger flexors, trace hip flexor 2 minus, knee extensor, trace ankle dorsiflexor  Cerebellar exam normal finger  to nose to finger as well as heel to shin in left upper and lower extremities, unable to perform due to motor weakness on the right side Musculoskeletal: Full range of motion in all 4 extremities. No joint swelling   Assessment/Plan: 1. Functional deficits secondary to left ACA infarct with right hemiparesis which require 3+ hours per day of interdisciplinary therapy in a comprehensive inpatient rehab setting. Physiatrist is providing close team supervision and 24 hour management of active medical problems listed below. Physiatrist and rehab team continue to assess barriers to discharge/monitor patient progress toward functional and medical goals. FIM: Function - Bathing Position: Wheelchair/chair at sink Body parts bathed by patient: Left arm, Chest, Abdomen, Front perineal area, Right upper leg, Left upper leg, Right lower leg, Left lower leg Body parts bathed by helper: Right arm, Buttocks, Back Assist Level: Touching or steadying assistance(Pt > 75%)  Function- Upper Body Dressing/Undressing What is the patient wearing?: Pull over shirt/dress Pull over shirt/dress - Perfomed by helper: Thread/unthread right sleeve, Thread/unthread left sleeve, Put head through opening, Pull shirt over trunk Function - Lower Body Dressing/Undressing What is the patient wearing?: Pants, Non-skid slipper socks Position: Wheelchair/chair at sink Pants- Performed by helper: Thread/unthread right pants leg, Thread/unthread left pants leg, Pull pants up/down Non-skid slipper socks- Performed by helper: Don/doff right sock, Don/doff left sock Assist for footwear: Maximal assist Assist for lower body dressing: Touching or steadying assistance (Pt > 75%)  Function - Toileting Toileting activity did not occur: No continent bowel/bladder event (using urinal, no BM) Toileting steps completed by patient: Adjust clothing prior to toileting, Performs perineal hygiene, Adjust clothing after toileting Toileting steps  completed by helper: Adjust clothing prior to toileting, Performs perineal hygiene, Adjust clothing after toileting Toileting Assistive Devices: Grab bar or rail  Function - Air cabin crew transfer activity did not occur: N/A (no BM, uses urinal ) Toilet transfer assistive device: Grab bar Assist level to toilet: Moderate assist (Pt 50 - 74%/lift or lower) Assist level from toilet: Moderate assist (Pt 50 - 74%/lift or lower)  Function - Chair/bed transfer Chair/bed transfer method: Stand pivot Chair/bed transfer assist level: Touching or steadying assistance (Pt > 75%) Chair/bed transfer assistive device: Armrests, Cane  Function - Locomotion: Wheelchair Will patient use wheelchair at discharge?: No Type: Manual Max wheelchair distance: 150 Assist Level: Supervision or verbal cues Wheel 50 feet with  2 turns activity did not occur: Safety/medical concerns Assist Level: Supervision or verbal cues Wheel 150 feet activity did not occur: Safety/medical concerns Assist Level: Supervision or verbal cues Function - Locomotion: Ambulation Assistive device: Cane-quad Max distance: 70 Assist level: Touching or steadying assistance (Pt > 75%) Assist level: Touching or steadying assistance (Pt > 75%) Walk 50 feet with 2 turns activity did not occur: Safety/medical concerns Assist level: Touching or steadying assistance (Pt > 75%) Walk 150 feet activity did not occur: Safety/medical concerns Walk 10 feet on uneven surfaces activity did not occur: Safety/medical concerns  Function - Comprehension Comprehension: Auditory Comprehension assist level: Follows basic conversation/direction with extra time/assistive device  Function - Expression Expression: Verbal Expression assist level: Expresses basic 50 - 74% of the time/requires cueing 25 - 49% of the time. Needs to repeat parts of sentences.  Function - Social Interaction Social Interaction assist level: Interacts appropriately 75  - 89% of the time - Needs redirection for appropriate language or to initiate interaction.  Function - Problem Solving Problem solving assist level: Solves basic 75 - 89% of the time/requires cueing 10 - 24% of the time  Function - Memory Memory assist level: Recognizes or recalls 75 - 89% of the time/requires cueing 10 - 24% of the time Patient normally able to recall (first 3 days only): Staff names and faces, Current season, That he or she is in a hospital  Medical Problem List and Plan: 1. Altered mental status, history of spastic right hemiparesis and multiple fallssecondary to left ACA infarct with history of left cortical infarct with spastic right hemiparesis -CIR PT, OT, speech eval's today 2. DVT Prophylaxis/Anticoagulation: Subcutaneous Lovenox. Monitor platelet counts and any signs of bleeding 3. Pain Management: Tylenol as needed -consider baclofen trial for spasticity 4. Mood: Zoloft 100 mg daily 5. Neuropsych: This patient iscapable of making decisions on hisown behalf. 6. Skin/Wound Care: Routine skin checks 7. Fluids/Electrolytes/Nutrition: Routine I&O with follow-up chemistries 8.Diabetes mellitus of peripheral neuropathy. Hemoglobin A1c 6.9.Levemir 30 units daily. Check blood sugars before meals and at bedtime. Diabetic teaching, good a.m. CBGs, add metformin 500 mg with breakfast to help with daytime CBGs still not going into Epic  CBG (last 3) 124, 258, 283   9.CRI stage II. Follow-up chemistries upon admit. 10.Hypertension. Permissive hypertension. Patient on Coreg 25 mg twice a day, HCTZ 25 mg daily, lisinopril 40 mg daily, Aldactone 50 mg daily, Norvasc 10 mg daily, Revatio 60 mg daily prior to admission. Resume amlodipine 5 mg today we'll slowly titrate other meds as needed Vitals:   07/07/16 0434 07/07/16 0824  BP: (!) 158/51 (!) 161/50  Pulse: (!) 55   Resp: 18   Temp: 98 F (36.7 C)    11.Hyperlipidemia.  Lipitor 12.Question medical compliance. Provide education 13. Leukocytosis, improving 15.7 on 2/2, 12.5 today 14. Hypoalbuminemia. Moderate, pro-stat LOS (Days) 3 A FACE TO FACE EVALUATION WAS PERFORMED  , E 07/07/2016, 8:57 AM

## 2016-07-07 NOTE — Progress Notes (Signed)
Social Work Assessment and Plan Social Work Assessment and Plan  Patient Details  Name: [name] MRN: [id number] Date of Birth: Jun 05, 1950  Today's Date: 07/07/2016  Problem List:  Patient Active Problem List   Diagnosis Date Noted  . Cerebrovascular small vessel disease 07/04/2016  . Aphasia due to recent cerebral infarction 07/04/2016  . Cerebrovascular accident (CVA) ([place])   . Altered mental status 06/30/2016  . Fall 06/30/2016  . Acute encephalopathy 06/30/2016  . Sepsis ([place]) 06/30/2016  . Diarrhea 06/30/2016  . Acute renal failure superimposed on stage 2 chronic kidney disease ([place]) 06/30/2016  . Hemiparesis affecting right side as late effect of stroke ([place]) 10/23/2015  . PAD (peripheral artery disease) ([place]) 10/20/2011  . Hyperlipidemia 02/21/2010  . CEREBROVASCULAR ACCIDENT, HX OF 12/22/2008  . Diabetes mellitus, type II ([place]) 07/30/2006  . OBESITY, NOS 07/30/2006  . Essential hypertension, benign 07/30/2006   Past Medical History:  Past Medical History:  Diagnosis Date  . 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 CVA (cerebral infarction) 01/03/2008   MRI: Acute 1 x 1.5 cm infarction affecting the left side of the pons.  . Diabetes mellitus   . DVT (deep venous thrombosis) ([place])   . Hypertension   . PAD (peripheral artery disease) ([place])   . Stroke [facility])    2008   Past Surgical History:  Past Surgical History:  Procedure Laterality Date  . None     Social History:  reports that he has never smoked. He has never used smokeless tobacco. He reports that he does not drink alcohol or use drugs.  Family / Support Systems Marital Status: Divorced Patient Roles: Parent Children: [name]-daughter [phone] Other Supports: Son who is local no number listed Anticipated Caregiver: Patient Ability/Limitations of Caregiver: daughter works 12pm-8pm and doesn't live with pt Caregiver Availability: Other (Comment) (pt plans to go home alone-with  daughter checking in on him) Family Dynamics: Close with daughter and son who are local and another son who lives in [place]. He has some friends who may check on him. Pt has limited supports and is mostly on his own.  Social History Preferred language: English Religion: Non-Denominational Cultural Background: No issues Education: High School Read: Yes Write: Yes Employment Status: Disabled Freight forwarder Issues: No issues Guardian/Conservator: None-according to MD pt is capable of making his own decisions while here.   Abuse/Neglect Physical Abuse: Denies Verbal Abuse: Denies Sexual Abuse: Denies Exploitation of patient/patient's resources: Denies Self-Neglect: Denies  Emotional Status Pt's affect, behavior adn adjustment status: Pt is motivated and plans to get to the level where he can stay alone and be independent again. He realizes he needs to be safe doing this and be mobile. He reports: " No one can care for him his children are busy and have jobs." Recent Psychosocial Issues: other health issues-hx CVA 2009 and had residula deficts in arm Pyschiatric History: No history deferred depression screen at this time due to coping appropriately, but this worker does feel he would benefit from seeing neuro-psych while here. Substance Abuse History: No history or issues  Patient / Family Perceptions, Expectations & Goals Pt/Family understanding of illness & functional limitations: Pt and daughter have a good understanding of his stroke and deficits. He has made progress and is encouraged by this but needs to make more while here. He does talk with the MD daily and asks questions. Premorbid pt/family roles/activities: Father, retiree, friend, etc Anticipated changes in roles/activities/participation: resume Pt/family expectations/goals: Pt states: "  I want to take care of myself like I always have."  Daughter states: " I hope he does well here and makes a lot of  progress."  [employer]: Other (Comment) (Uses [employer] for transportation) Premorbid Home Care/DME Agencies: Other (Comment) (had in past) Transportation available at discharge: [employer] and daughter Resource referrals recommended: Neuropsychology, Support group (specify)  Discharge Planning Living Arrangements: Alone Support Systems: Children, Friends/neighbors Type of Residence: Private residence Insurance Resources: [employer] Financial Resources: [employer] Financial Screen Referred: No Living Expenses: Rent Money Management: Patient Does the patient have any problems obtaining your medications?: No Home Management: Fraser Din does simple meals and family does home managment Patient/Family Preliminary Plans: Return home with daughter and son checking in on him, will not be able to stay with him. Team's goals are for mod/i level so should be safe home alone, with family coming in and out. Will await progress in therapies and work on safe plan. Social Work Anticipated Follow Up Needs: HH/OP, Support Group  Clinical Impression Pleasant gentleman who is motivated to improve and has ben through this before in 2009 after having his first stroke, he did well and is hopeful he will again. He needs to be mod/i to be able to return home alone Due to children work and can not provide 24 hr care. Will work on discharge needs and have neuro-psych see while here for support.  Elease Hashimoto 07/07/2016, 2:16 PM

## 2016-07-07 NOTE — Progress Notes (Signed)
Occupational Therapy Session Note  Patient Details  Name: [name] MRN: [id number] Date of Birth: [birth year]  Today's Date: 07/07/2016 OT Individual Time: 1003-1100 OT Individual Time Calculation (min): 57 min    Short Term Goals: Week 1:  OT Short Term Goal 1 (Week 1): Pt will complete stand pivot transfer to toilet with close supervision using LRAD OT Short Term Goal 2 (Week 1): Pt will dress LB with min A sit <> stand OT Short Term Goal 3 (Week 1): Pt will dress UB using hemi technique with 1 VC for technique OT Short Term Goal 4 (Week 1): Pt will complete 3/3 toileting tasks with steadying assist  Skilled Therapeutic Interventions/Progress Updates:    Pt completed bathing and dressing during session.  Mod assist for stand pivot transfer to and from the tub bench to shower.  Mod assist for standing balance when washing peri area and for pulling pants over hips secondary to increased posterior lean.  He needed min assist to complete UB bathing secondary to not being able to wash the RUE with the LUE secondary to hemiparesis.  Decreased ability to reach either foot in shower secondary to flexibility issues and also room in the shower.  Will benefit from [facility] sponge for future use.  Dressing with mod instructional cueing and mod assist secondary to starting with the LLE first even though he stated to start with the left.  Pt also reports that he usually dressing in bed in supine so will see how he does this next session.  Pt left in wheelchair with call button in place and safety belt in place.    Therapy Documentation Precautions:  Precautions Precautions: Fall Precaution Comments: Rt hemiparesis Restrictions Weight Bearing Restrictions: No  Pain: Pain Assessment Pain Assessment: No/denies pain ADL: See Function Navigator for Current Functional Status.   Therapy/Group: Individual Therapy  Crystian Frith OTR/L 07/07/2016, 12:20 PM

## 2016-07-07 NOTE — Progress Notes (Signed)
CBG at HS=218, requiring 2 units of Novolog. [name] A

## 2016-07-07 NOTE — Progress Notes (Signed)
Occupational Therapy Session Note  Patient Details  Name: [name] MRN: [id number] Date of Birth: [birth year]  Today's Date: 07/07/2016 OT Individual Time: 1349-1416 OT Individual Time Calculation (min): 27 min    Short Term Goals: Week 1:  OT Short Term Goal 1 (Week 1): Pt will complete stand pivot transfer to toilet with close supervision using LRAD OT Short Term Goal 2 (Week 1): Pt will dress LB with min A sit <> stand OT Short Term Goal 3 (Week 1): Pt will dress UB using hemi technique with 1 VC for technique OT Short Term Goal 4 (Week 1): Pt will complete 3/3 toileting tasks with steadying assist  Skilled Therapeutic Interventions/Progress Updates:    Pt practiced tub/shower transfers using a tub bench during session.  Min assist to complete transfer with use of the quad cane.  He needed min assist for lifting the LLE over the edge of the tub going in and coming out.  Max instructional cueing to not slide too far forward when attempting to transfer out of the shower.  Pt with noted right knee hyper-extension when attempting to weight bear while stepping forward with the LLE.  Transitioned to therapy gym via wheelchair.  Completed scapular mobilizations in sitting with emphasis on adduction, elevation and depression.  Had pt complete 1 set of 10 reps scapular adduction with mod facilitation.  Also had him perform simple shoulder flexion elbow extension movements holding therapist's hand. Noted increased tone in elbow extensors, internal rotators, and digit flexors.  He transferred back to the wheelchair and back to the room at end of session with safety belt in place and call button in reach.    Therapy Documentation Precautions:  Precautions Precautions: Fall Precaution Comments: Rt hemiparesis Restrictions Weight Bearing Restrictions: No  Pain: Pain Assessment Pain Assessment: No/denies pain ADL: See Function Navigator for Current Functional Status.   Therapy/Group:  Individual Therapy  Gearlene Godsil OTR/L 07/07/2016, 4:28 PM

## 2016-07-07 NOTE — IPOC Note (Signed)
Overall Plan of Care [facility]) Patient Details Name: [name] MRN: [id number] DOB: 13-Feb-1951  Admitting Diagnosis: l CVA  Hospital Problems: Principal Problem:   Cerebrovascular small vessel disease Active Problems:   Hemiparesis affecting right side as late effect of stroke ([place])   Aphasia due to recent cerebral infarction     Functional Problem List: Nursing Bladder, Bowel, Endurance, Motor, Safety, Skin Integrity  PT Balance, Endurance, Motor  OT Balance, Cognition, Endurance, Sensory, Safety  SLP Cognition, Linguistic, Nutrition  TR         Basic ADL's: OT Grooming, Eating, Bathing, Dressing, Toileting     Advanced  ADL's: OT Simple Meal Preparation, Light Housekeeping     Transfers: PT Bed Mobility, Bed to Chair, Teacher, early years/pre, Tub/Shower     Locomotion: PT Ambulation, Stairs     Additional Impairments: OT Fuctional Use of Upper Extremity  SLP Swallowing, Communication, Social Cognition expression Problem Solving, Memory, Awareness  TR      Anticipated Outcomes Item Anticipated Outcome  Self Feeding Mod I  Swallowing  Mod I    Basic self-care  Mod I  Toileting  Mod I   Bathroom Transfers Supervision- min A  Bowel/Bladder  Continent to bowel and bladder with min. assist.  Transfers  mod I transfers  Locomotion  mod I gait, S stairs  Communication  Supervision   Cognition  Supervision   Pain  Less than 3,on 1 to 10 scale.  Safety/Judgment  Free from falls during his stay in rehab.   Therapy Plan: PT Intensity: Minimum of 1-2 x/day ,45 to 90 minutes PT Frequency: 5 out of 7 days PT Duration Estimated Length of Stay: 18 to 21 OT Intensity: Minimum of 1-2 x/day, 45 to 90 minutes OT Frequency: 5 out of 7 days OT Duration/Estimated Length of Stay: 2.5 weeks SLP Intensity: Minumum of 1-2 x/day, 30 to 90 minutes SLP Frequency: 3 to 5 out of 7 days SLP Duration/Estimated Length of Stay: 2-3 weeks       Team Interventions: Nursing  Interventions Patient/Family Education, Bladder Management, Bowel Management, Pain Management, Disease Management/Prevention, Skin Care/Wound Management, Discharge Planning  PT interventions Ambulation/gait training, Balance/vestibular training, Discharge planning, Functional electrical stimulation, DME/adaptive equipment instruction, Functional mobility training, Neuromuscular re-education, Patient/family education, Stair training, Therapeutic Activities, Therapeutic Exercise, UE/LE Strength taining/ROM, UE/LE Coordination activities  OT Interventions Balance/vestibular training, Discharge planning, DME/adaptive equipment instruction, Functional mobility training, Neuromuscular re-education, Patient/family education, Psychosocial support, Self Care/advanced ADL retraining, Therapeutic Activities, Therapeutic Exercise, UE/LE Strength taining/ROM, UE/LE Coordination activities  SLP Interventions Cognitive remediation/compensation, Cueing hierarchy, Dysphagia/aspiration precaution training, Functional tasks, Internal/external aids, Medication managment, Patient/family education, Speech/Language facilitation, Therapeutic Exercise, Therapeutic Activities  TR Interventions    SW/CM Interventions Discharge Planning, Psychosocial Support, Patient/Family Education    Team Discharge Planning: Destination: PT-Home ,OT- Home , SLP-Home Projected Follow-up: PT-Home health PT, OT-  Home health OT, SLP-24 hour supervision/assistance, Outpatient SLP Projected Equipment Needs: PT-To be determined, OT-  , SLP-None recommended by SLP Equipment Details: PT- , OT-Pt has tub transfer bench Patient/family involved in discharge planning: PT- Patient,  OT-Patient, SLP-Patient  MD ELOS: 20-25d Medical Rehab Prognosis:  Excellent Assessment:  66 y.o.right handed malewith history of hypertension, diabetes mellitus, CKD stage II, CVA 2008 with residual right sided weakness maintained on aspirin and Plavix.Per chart review  lives alone. Walks with a quad cane. He uses [employer]for transportation. Daughter in the area question cyst.Presented 06/30/2016 with increasing lethargy over the past 3 days and multiple falls with altered  mental status. Reports of diarrhea for the past few days, WBC 23,000, sodium 132. Cranial CT scan negative. Chest x-ray with no acute disease. C. difficile specimen negative. Protocol initiated for suspect sepsis.MRI showed diffusion restriction within the left aspect of corpus colostomy extending into the left cingulate gyrus in the paramedian left frontal lobe compatible with acute early subacute infarct, left ACA distribution   Now requiring 24/7 Rehab RN,MD, as well as CIR level PT, OT and SLP.  Treatment team will focus on ADLs and mobility with goals set at ModI/Sup  See Team Conference Notes for weekly updates to the plan of care

## 2016-07-07 NOTE — Progress Notes (Signed)
Physical Therapy Note  Patient Details  Name: [name] MRN: [id number] Date of Birth: May 31, 1951 Today's Date: 07/07/2016    Time: 1115-1155 40 minutes  1:1 No c/o pain. W/c mobility with hemi technique with distant supervision throughout unit.  Good maneuvering in home and controlled environments with w/c.  Gait with WBQC min A 50' x 2.  Obstacle negotiation with [facility] with min A, 1 x LOB requiring mod A to prevent fall due to Rt foot drag.  Step ups and downs with Rt LE for control and strengthening with min A for hip and knee control. Pt with good motivation and good participation throughout session.   Deven Furia 07/07/2016, 11:51 AM

## 2016-07-07 NOTE — Care Management Note (Signed)
Inpatient [facility] Individual Statement of Services  Patient Name:  [name]  Date:  07/07/2016  Welcome to the [facility].  Our goal is to provide you with an individualized program based on your diagnosis and situation, designed to meet your specific needs.  With this comprehensive rehabilitation program, you will be expected to participate in at least 3 hours of rehabilitation therapies Monday-Friday, with modified therapy programming on the weekends.  Your rehabilitation program will include the following services:  Physical Therapy (PT), Occupational Therapy (OT), Speech Therapy (ST), 24 hour per day rehabilitation nursing, Therapeutic Recreaction (TR), Neuropsychology, Case Management (Social Worker), Rehabilitation Medicine, Nutrition Services and Pharmacy Services  Weekly team conferences will be held on Wednesday to discuss your progress.  Your Social Worker will talk with you frequently to get your input and to update you on team discussions.  Team conferences with you and your family in attendance may also be held.  Expected length of stay: 18-21 days  Overall anticipated outcome: mod/i-supervision level  Depending on your progress and recovery, your program may change. Your Social Worker will coordinate services and will keep you informed of any changes. Your Social Worker's name and contact numbers are listed  below.  The following services may also be recommended but are not provided by the [facility]:    [facility] will be made to provide these services after discharge if needed.  Arrangements include referral to agencies that provide these services.  Your insurance has been verified to be:  Medicare Your primary doctor is:  [employer], [employer]  Pertinent information will be shared with your doctor and your insurance  company.  Social Worker:  Ovidio Kin, [place] or ([phone]  Information discussed with and copy given to patient by: Elease Hashimoto, 07/07/2016, 2:00 PM

## 2016-07-07 NOTE — Progress Notes (Signed)
Meredith Staggers, MD Physician Signed Physical Medicine and Rehabilitation  Consult Note Date of Service: 07/03/2016 5:49 AM  Related encounter: ED to Hosp-Admission (Discharged) from 06/29/2016 in [place] All Collapse All   [] Hide copied text [] Hover for attribution information      Physical Medicine and Rehabilitation Consult Reason for Consult: Acute ischemic infarct left ACA territory Referring Physician: [facility]   HPI: [name] is a 66 y.o. 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 Per chart review lives alone. Walks with a quad cane. He uses [employer] for transportation. Daughter in the area question cyst. Presented 06/30/2016 with increasing lethargy over the past 3 days and multiple falls with altered mental status. Reports of diarrhea for the past few days, WBC 23,000, sodium 132. Cranial CT scan negative. Chest x-ray with no acute disease. C. difficile specimen negative. Protocol initiated for suspect sepsis. MRI showed diffusion restriction within the left aspect of corpus colostomy extending into the left cingulate gyrus in the paramedian left frontal lobe compatible with acute early subacute infarct, left ACA distribution. 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 Occlusion or near occlusion the left anterior cerebral artery 1.5 cm beyond its origin. Echocardiogram with ejection fraction of 123456 grade 2 diastolic dysfunction. No wall motion abnormalities. Neurology consult at present maintain on aspirin 325 mg daily as well as Plavix for CVA prophylaxis. Subcutaneous Lovenox for DVT prophylaxis. WBC trending down 15,700. Physical and occupational therapy evaluations  completed. M.D. has requested physical medicine rehabilitation consult.   Review of Systems  Constitutional: Negative for chills and fever.  HENT: Negative for hearing loss and tinnitus.   Eyes: Negative for blurred vision and double vision.  Respiratory: Negative for cough and shortness of breath.   Cardiovascular: Positive for leg swelling. Negative for chest pain and palpitations.  Gastrointestinal: Positive for constipation. Negative for nausea and vomiting.  Genitourinary: Negative for dysuria, flank pain and hematuria.  Musculoskeletal: Positive for falls and myalgias.  Skin: Negative for rash.  Neurological: Positive for speech change and weakness. Negative for seizures.  Psychiatric/Behavioral: Positive for depression.  All other systems reviewed and are negative.      Past Medical History:  Diagnosis Date  . 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 CVA (cerebral infarction) 01/03/2008   MRI: Acute 1 x 1.5 cm infarction affecting the left side of the pons.  . Diabetes mellitus   . DVT (deep venous thrombosis) ([place])   . Hypertension   . PAD (peripheral artery disease) ([place])   . Stroke [facility])    2008        Past Surgical History:  Procedure Laterality Date  . None          Family History  Problem Relation Age of Onset  . Diabetes Mother   . Heart attack Mother   . Hypertension Mother   . Diabetes Sister   . Hypertension Sister   . Hypertension Brother   . Hypertension Daughter   . Hypertension Son   . Colon cancer Neg Hx   . Esophageal cancer Neg Hx   . Stomach cancer Neg Hx   . Rectal cancer Neg Hx    Social History:  reports that  he has never smoked. He has never used smokeless tobacco. He reports that he does not drink alcohol or use drugs. Allergies: No Known Allergies          Facility-Administered Medications Prior to Admission  Medication Dose Route Frequency Provider Last Rate Last Dose  . 0.9 %  sodium chloride  infusion  500 mL Intravenous Continuous Gatha Mayer, MD             Medications Prior to Admission  Medication Sig Dispense Refill  . aspirin 81 MG tablet Take 1 tablet (81 mg total) by mouth daily. (Patient taking differently: Take 81 mg by mouth every morning. ) 30 tablet 11  . atorvastatin (LIPITOR) 40 MG tablet Take 1 tablet (40 mg total) by mouth daily. (Patient taking differently: Take 40 mg by mouth every evening. ) 90 tablet 3  . B-D ULTRAFINE III SHORT PEN 31G X 8 MM MISC USE WITH LEVEMIR 100 each 0  . Blood Gluc Meter Disp-Strips (SIDEKICK BLOOD GLUCOSE SYSTEM) DEVI Use for daily testing 100 each 3  . carvedilol (COREG) 25 MG tablet TAKE 1 TABLET (25 MG TOTAL) BY MOUTH 2 (TWO) TIMES DAILY WITH A MEAL. 180 tablet 3  . clopidogrel (PLAVIX) 75 MG tablet TAKE 1 TABLET (75 MG TOTAL) BY MOUTH DAILY. (Patient taking differently: Take 75 mg by mouth every morning. ) 90 tablet 3  . hydrochlorothiazide (HYDRODIURIL) 25 MG tablet TAKE 1 TABLET (25 MG TOTAL) BY MOUTH DAILY. (Patient taking differently: TAKE 1 TABLET (25 MG TOTAL) BY MOUTH EVERY EVENING) 90 tablet 2  . insulin aspart (NOVOLOG FLEXPEN) 100 UNIT/ML FlexPen Inject 15 Units into the skin 2 (two) times daily with a meal. 3 pen 3  . Insulin Syringe-Needle U-100 28G X 1/2" 1 ML MISC 1 each by Does not apply route 3 (three) times daily. 100 each 11  . LEVEMIR FLEXTOUCH 100 UNIT/ML Pen INJECT 30 UNITS INTO THE SKIN DAILY 15 pen 5  . lisinopril (PRINIVIL,ZESTRIL) 40 MG tablet TAKE 1 TABLET (40 MG TOTAL) BY MOUTH DAILY. 90 tablet 3  . sertraline (ZOLOFT) 100 MG tablet TAKE 1 TABLET BY MOUTH EVERY DAY (Patient taking differently: TAKE 100 MG BY MOUTH EVERY MORNING) 90 tablet 1  . spironolactone (ALDACTONE) 50 MG tablet Take 1 tablet (50 mg total) by mouth daily. (Patient taking differently: Take 50 mg by mouth every morning. ) 90 tablet 1  . amLODipine (NORVASC) 10 MG tablet TAKE 1 TABLET (10 MG TOTAL) BY MOUTH DAILY. (Patient not taking:  Reported on 06/30/2016) 90 tablet 3  . baclofen (LIORESAL) 10 MG tablet TAKE 1/2 TABLET BY MOUTH 1 time a DAY AS NEEDED FOR MUSCLE SPASMS (Patient not taking: Reported on 06/30/2016) 30 tablet 3  . sildenafil (REVATIO) 20 MG tablet Take 2-3 tablets (40-60 mg total) by mouth daily as needed (intercourse). (Patient not taking: Reported on 06/30/2016) 30 tablet 0    Home: [place] expects to be discharged to:: Private residence Living Arrangements: Alone Available Help at Discharge: Family, Available PRN/intermittently (Patient reports family member could stay with him at d/c) Type of Home: House Home Access: Level entry Home Layout: One level Bathroom Shower/Tub: Tub/shower unit, Architectural technologist: Standard Bathroom Accessibility: Yes Home Equipment: Cane - quad, Bedside commode, Tub bench  Functional History: Prior Function Level of Independence: Needs assistance Gait / Transfers Assistance Needed: walks with quad cane without assist on flat surfaces ADL's / Homemaking Assistance Needed: Pt states he bathes and dresses himself, does  simple cooking.  Family helps with cleaning and brings groceries.  Uses [employer] for transportation. Functional Status:  Mobility: Bed Mobility Overal bed mobility: Needs Assistance Bed Mobility: Rolling, Sidelying to Sit, Sit to Sidelying Rolling: Mod assist Sidelying to sit: Max assist, HOB elevated Supine to sit: Min guard, HOB elevated Sit to sidelying: Mod assist, HOB elevated General bed mobility comments: Patient rolls to Rt side using LUE to pull on bed rail.  Assist to bring trunk/LE's over.  Requires max assist to move to sitting - unable to use RUE to assist with pushing up.  Once upright, patient able to maintain sitting balance with min guard assist.  Patient sat EOB x 12 minutes.  Assist to bring RLE onto bed to return to sidelying.  Required +2 max assist to scoot toward HOB in supine. Transfers Overall transfer level:  Needs assistance Equipment used: Quad cane, 2 person hand held assist Transfers: Sit to/from Stand Sit to Stand: Min assist, +2 physical assistance General transfer comment: NT Ambulation/Gait Ambulation/Gait assistance: Min assist, +2 physical assistance, +2 safety/equipment Ambulation Distance (Feet): 30 Feet Assistive device: Quad cane Gait Pattern/deviations: Step-to pattern, Decreased step length - right, Decreased stance time - right, Shuffle, Trunk flexed, Antalgic General Gait Details: Cues for QC placement and advancement of R LE.  Physical assist for balance, wt shift.  Advancement of R LE with hip hike, increased ER and circumduction to compensate for increased tone into extension and lack of knee flex/ankle DF in swing phase Gait velocity: decr  ADL: ADL Overall ADL's : Needs assistance/impaired Eating/Feeding: Set up, Sitting Grooming: Sitting, Minimal assistance Grooming Details (indicate cue type and reason): assist donning deodorant. Upper Body Bathing: Minimal assistance, Sitting Lower Body Bathing: Moderate assistance, Sit to/from stand Upper Body Dressing : Minimal assistance, Sitting Lower Body Dressing: Moderate assistance, Sit to/from stand Toilet Transfer: Minimal assistance, +2 for safety/equipment, BSC (quad cane) Toileting- Clothing Manipulation and Hygiene: Minimal assistance, Sit to/from stand, Sitting/lateral lean Functional mobility during ADLs: Minimal assistance, +2 for safety/equipment, Cueing for sequencing, Cueing for safety (quad cane) General ADL Comments: Pt overall not steady on feet and requires several cues for safety with quad cane when up on his feet.  Pt has difficulty reaching his feet.  Wears slip on bedroom shoes most of the time which may not be the best option given his RLE weakness.  Cognition: Cognition Overall Cognitive Status: Difficult to assess Orientation Level: Oriented X4 Cognition Arousal/Alertness: Awake/alert Behavior  During Therapy: WFL for tasks assessed/performed, Flat affect Overall Cognitive Status: Difficult to assess General Comments: Pt does fairly well cognitively, alert and oriented following all commands.  Pt is slow to process and with decreased problem solving skillls.   Difficult to assess due to: Impaired communication  Blood pressure (!) 165/63, pulse (!) 50, temperature 98.7 F (37.1 C), temperature source Oral, resp. rate 20, height 5\' 7"  (1.702 m), weight 111.1 kg (245 lb), SpO2 100 %. Physical Exam  Vitals reviewed. HENT:  Mild right facial droop  Eyes:  Pupils round and reactive to light  Neck: Normal range of motion. Neck supple. No thyromegaly present.  Cardiovascular:  Cardiac rate controlled  Respiratory: Effort normal and breath sounds normal. No respiratory distress.  GI: Soft. Bowel sounds are normal. He exhibits no distension.  Neurological:  Alert. Flat effect. Speech is dysarthric. He's aphasic. Can follow simple commands. Right central 7 and tongue deviation and poor oro-motor control. Spastic right hemiparesis ?1-2/5 UE and LE---did not initiate consistently. Tone MAS 2-3/4.  LUE 4/5 prox to distal. LLE:  3-4/5 prox to distal.   Skin: Skin is warm and dry.    Lab Results Last 24 Hours       Results for orders placed or performed during the hospital encounter of 06/29/16 (from the past 24 hour(s))  Glucose, capillary     Status: Abnormal   Collection Time: 07/02/16  6:31 AM  Result Value Ref Range   Glucose-Capillary 123 (H) 65 - 99 mg/dL   Comment 1 Notify RN    Comment 2 Document in Chart   Glucose, capillary     Status: Abnormal   Collection Time: 07/02/16 11:27 AM  Result Value Ref Range   Glucose-Capillary 256 (H) 65 - 99 mg/dL  Glucose, capillary     Status: Abnormal   Collection Time: 07/02/16  4:37 PM  Result Value Ref Range   Glucose-Capillary 177 (H) 65 - 99 mg/dL  Glucose, capillary     Status: Abnormal   Collection Time: 07/02/16   9:04 PM  Result Value Ref Range   Glucose-Capillary 144 (H) 65 - 99 mg/dL   Comment 1 Notify RN    Comment 2 Document in Chart   CBC with Differential/Platelet     Status: Abnormal   Collection Time: 07/03/16  3:45 AM  Result Value Ref Range   WBC 11.4 (H) 4.0 - 10.5 K/uL   RBC 3.78 (L) 4.22 - 5.81 MIL/uL   Hemoglobin 11.2 (L) 13.0 - 17.0 g/dL   HCT 34.6 (L) 39.0 - 52.0 %   MCV 91.5 78.0 - 100.0 fL   MCH 29.6 26.0 - 34.0 pg   MCHC 32.4 30.0 - 36.0 g/dL   RDW 12.5 11.5 - 15.5 %   Platelets 148 (L) 150 - 400 K/uL   Neutrophils Relative % 68 %   Neutro Abs 7.7 1.7 - 7.7 K/uL   Lymphocytes Relative 23 %   Lymphs Abs 2.6 0.7 - 4.0 K/uL   Monocytes Relative 6 %   Monocytes Absolute 0.7 0.1 - 1.0 K/uL   Eosinophils Relative 3 %   Eosinophils Absolute 0.3 0.0 - 0.7 K/uL   Basophils Relative 0 %   Basophils Absolute 0.0 0.0 - 0.1 K/uL  Basic metabolic panel     Status: Abnormal   Collection Time: 07/03/16  3:45 AM  Result Value Ref Range   Sodium 138 135 - 145 mmol/L   Potassium 3.9 3.5 - 5.1 mmol/L   Chloride 107 101 - 111 mmol/L   CO2 22 22 - 32 mmol/L   Glucose, Bld 117 (H) 65 - 99 mg/dL   BUN 10 6 - 20 mg/dL   Creatinine, Ser 0.86 0.61 - 1.24 mg/dL   Calcium 8.4 (L) 8.9 - 10.3 mg/dL   GFR calc non Af Amer >60 >60 mL/min   GFR calc Af Amer >60 >60 mL/min   Anion gap 9 5 - 15      Imaging Results (Last 48 hours)  Ct Angio Head W Or Wo Contrast  Result Date: 07/02/2016 CLINICAL DATA:  Hypertension and right hemiparesis. Slurred speech. Lethargy. EXAM: CT ANGIOGRAPHY HEAD AND NECK TECHNIQUE: Multidetector CT imaging of the head and neck was performed using the standard protocol during bolus administration of intravenous contrast. Multiplanar CT image reconstructions and MIPs were obtained to evaluate the vascular anatomy. Carotid stenosis measurements (when applicable) are obtained utilizing NASCET criteria, using the distal internal  carotid diameter as the denominator. CONTRAST:  100 cc Isovue 370 COMPARISON:  MRI same  day FINDINGS: CT HEAD FINDINGS Brain: Low-density now evident affecting the left corpus callosum and cingulate gyrus consistent with acute infarction. No hemorrhage. 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 No hydrocephalus. Vascular: There is atherosclerotic calcification of the major vessels at the base of the brain. Skull: Negative Sinuses: Clear Orbits: Negative Review of the MIP images confirms the above findings CTA NECK FINDINGS Aortic arch: Aortic atherosclerosis. No aneurysm or dissection. Common origin of the innominate artery and left common carotid artery. Right carotid system: Common carotid artery widely patent to the bifurcation. Atherosclerotic disease at the carotid bifurcation, primarily calcified. Minimal diameter of the ICA is 3.5 mm. This indicates a 30% stenosis. Atherosclerotic disease at the skullbase results in narrowing to a diameter of 2.5 mm. This indicates a 50% stenosis. Left carotid system: Common carotid artery widely patent to the bifurcation. Atherosclerotic change in the ICA bulb, primarily calcified. Minimal diameter is 3.5 mm, consistent with 30% stenosis. Cervical ICA is widely patent beyond that. Vertebral arteries: 50% irregular stenosis of the proximal left subclavian artery. Atherosclerotic calcification at both vertebral arteries and proximal vertebral arteries. Detail limited by shoulder density. Stenosis estimated at 50-70% on both sides. Beyond that, the vessels show some atherosclerotic change in the cervical region but are patent to the foramen magnum. Severe stenosis of the distal left vertebral artery at the foramen magnum, estimated at 80%. Skeleton: Ordinary spondylosis Other neck: No significant soft tissue lesion. Upper chest: Negative Review of the MIP images confirms the above findings CTA HEAD FINDINGS Anterior  circulation: Internal carotid artery is patent through the siphon region. There is extensive atherosclerotic calcification in the carotid siphon region with stenosis estimated at 50-70%. Supraclinoid ICA is patent. There is flow in both the right middle cerebral artery and right anterior cerebral artery. Left ICA is patent through the siphon region. Again there is extensive calcification with stenosis estimated at 50-70%. Anterior and middle cerebral vessels are patent proximally. The left anterior cerebral artery is occluded or nearly occluded 1.5 cm beyond its origin. Some diminished flow beyond that could be collateral or reconstituted flow. Posterior circulation: Right vertebral artery is occluded after Pike up. As noted above, 80% stenosis of the left vertebral artery at the foramen magnum level. Beyond that, the vessel is small and irregular but does reach the basilar. No basilar stenosis. Superior cerebellar and posterior cerebral vessels show flow. Venous sinuses: Patent and normal Anatomic variants: None significant Delayed phase: No abnormal enhancement Review of the MIP images confirms the above findings IMPRESSION: Low-density now visible in the region of acute infarction affecting the left corpus callosum and cingulate gyrus. No hemorrhage. 50% irregular stenosis of the proximal left subclavian artery. 30% stenosis in the proximal ICA on both sides. 50% stenosis of the right cervical ICA just beneath the skullbase. Atherosclerotic disease at both proximal vertebral arteries worse on the right than the left. Stenoses in those regions estimated at 50-70% bilaterally. 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 There is some flow more distal to that within the left ACA, which could be reconstituted or collateral. Occlusion of  the right vertebral artery distally, beyond PICA. Stenosis of the distal left vertebral artery, which does remain patent and give flow to the basilar. Posterior circulation branch vessels show flow. Electronically Signed   By: Elta Guadeloupe  Shogry M.D.   On: 07/02/2016 20:35   Ct Angio Neck W Or Wo Contrast  Result Date: 07/02/2016 CLINICAL DATA:  Hypertension and right hemiparesis. Slurred speech. Lethargy. EXAM: CT ANGIOGRAPHY HEAD AND NECK TECHNIQUE: Multidetector CT imaging of the head and neck was performed using the standard protocol during bolus administration of intravenous contrast. Multiplanar CT image reconstructions and MIPs were obtained to evaluate the vascular anatomy. Carotid stenosis measurements (when applicable) are obtained utilizing NASCET criteria, using the distal internal carotid diameter as the denominator. CONTRAST:  100 cc Isovue 370 COMPARISON:  MRI same day FINDINGS: CT HEAD FINDINGS Brain: Low-density now evident affecting the left corpus callosum and cingulate gyrus consistent with acute infarction. No hemorrhage. 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 No hydrocephalus. Vascular: There is atherosclerotic calcification of the major vessels at the base of the brain. Skull: Negative Sinuses: Clear Orbits: Negative Review of the MIP images confirms the above findings CTA NECK FINDINGS Aortic arch: Aortic atherosclerosis. No aneurysm or dissection. Common origin of the innominate artery and left common carotid artery. Right carotid system: Common carotid artery widely patent to the bifurcation. Atherosclerotic disease at the carotid bifurcation, primarily calcified. Minimal diameter of the ICA is 3.5 mm. This indicates a 30% stenosis. Atherosclerotic disease at the skullbase results in narrowing to a diameter of 2.5 mm. This indicates a 50% stenosis. Left carotid system: Common carotid artery widely patent to the bifurcation.  Atherosclerotic change in the ICA bulb, primarily calcified. Minimal diameter is 3.5 mm, consistent with 30% stenosis. Cervical ICA is widely patent beyond that. Vertebral arteries: 50% irregular stenosis of the proximal left subclavian artery. Atherosclerotic calcification at both vertebral arteries and proximal vertebral arteries. Detail limited by shoulder density. Stenosis estimated at 50-70% on both sides. Beyond that, the vessels show some atherosclerotic change in the cervical region but are patent to the foramen magnum. Severe stenosis of the distal left vertebral artery at the foramen magnum, estimated at 80%. Skeleton: Ordinary spondylosis Other neck: No significant soft tissue lesion. Upper chest: Negative Review of the MIP images confirms the above findings CTA HEAD FINDINGS Anterior circulation: Internal carotid artery is patent through the siphon region. There is extensive atherosclerotic calcification in the carotid siphon region with stenosis estimated at 50-70%. Supraclinoid ICA is patent. There is flow in both the right middle cerebral artery and right anterior cerebral artery. Left ICA is patent through the siphon region. Again there is extensive calcification with stenosis estimated at 50-70%. Anterior and middle cerebral vessels are patent proximally. The left anterior cerebral artery is occluded or nearly occluded 1.5 cm beyond its origin. Some diminished flow beyond that could be collateral or reconstituted flow. Posterior circulation: Right vertebral artery is occluded after Pike up. As noted above, 80% stenosis of the left vertebral artery at the foramen magnum level. Beyond that, the vessel is small and irregular but does reach the basilar. No basilar stenosis. Superior cerebellar and posterior cerebral vessels show flow. Venous sinuses: Patent and normal Anatomic variants: None significant Delayed phase: No abnormal enhancement Review of the MIP images confirms the above findings  IMPRESSION: Low-density now visible in the region of acute infarction affecting the left corpus callosum and cingulate gyrus. No hemorrhage. 50% irregular stenosis of the proximal left subclavian artery. 30% stenosis in the proximal ICA on both sides. 50% stenosis of the right cervical ICA just beneath the skullbase. Atherosclerotic disease at both proximal vertebral arteries worse on the right  than the left. Stenoses in those regions estimated at 50-70% bilaterally. 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 There is some flow more distal to that within the left ACA, which could be reconstituted or collateral. 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 Posterior circulation branch vessels show flow. Electronically Signed   By: Nelson Chimes M.D.   On: 07/02/2016 20:35   Mr Brain Wo Contrast  Result Date: 07/01/2016 CLINICAL DATA:  66 y/o M; acute weakness, altered mental status, and multiple falls with concern for acute stroke. EXAM: MRI HEAD WITHOUT CONTRAST TECHNIQUE: Multiplanar, multiecho pulse sequences of the brain and surrounding structures were obtained without intravenous contrast. COMPARISON:  06/29/2016 CT head.  01/03/2008 MRI head. FINDINGS: Brain: Diffusion restriction within the left aspect of corpus callosum extending into left cingulate gyrus in paramedian frontal lobe compatible with acute/ early subacute infarction. 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 Chronic infarct within the left hemi pons. Moderate diffuse supratentorial and infratentorial brain parenchymal volume  loss. Background of mild chronic microvascular ischemic changes in white matter. No hydrocephalus. No extra-axial collection. No abnormal susceptibility hypointensity to indicate intracranial hemorrhage. Vascular: Normal flow voids. Skull and upper cervical spine: Normal marrow signal. Sinuses/Orbits: Mild diffuse paranasal sinus mucosal thickening greatest in the maxillary sinuses. No abnormal signal of the mastoid air cells. Orbits are unremarkable. Other: None. IMPRESSION: 1. Diffusion restriction within the left aspect of corpus callosum extending into left cingulate gyrus in paramedian frontal lobe compatible with acute/ early subacute infarction, left ACA distribution. No acute hemorrhage identified. 2. Mild chronic microvascular ischemic changes and moderate diffuse brain parenchymal volume loss. Chronic left pons infarct. 3. Mild diffuse paranasal sinus disease. These results will be called to the ordering clinician or representative by the Radiologist Assistant, and communication documented in the PACS or zVision Dashboard. Electronically Signed   By: Kristine Garbe M.D.   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 Does the need for close, 24 hr/day medical supervision in concert with the patient's rehab needs make it unreasonable for this patient to be served in a less intensive setting? Yes 2. Co-Morbidities requiring supervision/potential complications: dm, htn, CKD/AKD 3. Due to bladder management, bowel management, safety, skin/wound care, disease management, medication administration, pain management and patient education, does the patient require 24 hr/day rehab nursing? Yes 4. Does the patient require coordinated care of a physician, rehab nurse, PT (1-2 hrs/day, 5 days/week), OT (1-2 hrs/day, 5 days/week) and SLP (1-2 hrs/day, 5 days/week) to address physical and functional deficits in  the context of the above medical diagnosis(es)? Yes Addressing deficits in the following areas: balance, endurance, locomotion, strength, transferring, bowel/bladder control, bathing, dressing, feeding, grooming, toileting, cognition, speech, language, swallowing and psychosocial support 5. Can the patient actively participate in an intensive therapy program of at least 3 hrs of therapy per day at least 5 days per week? Yes 6. The potential for patient to make measurable gains while on inpatient rehab is good 7. Anticipated functional outcomes upon discharge from inpatient rehab are min assist and mod assist  with  PT, min assist and mod assist with OT, supervision and min assist with SLP. 8. Estimated rehab length of stay to reach the above functional goals is: 20-28 days 9. Does the patient have adequate social supports and living environment to accommodate these discharge functional goals? Potentially 10. Anticipated D/C setting: Home 11. Anticipated post D/C treatments: [place] therapy 12. Overall Rehab/Functional Prognosis: excellent  RECOMMENDATIONS: This patient's condition is appropriate for continued rehabilitative care in the following setting: CIR Patient has agreed to participate in recommended program. Yes and Potentially Note that insurance prior authorization may be required for reimbursement for recommended care.  Comment: Rehab Admissions Coordinator to follow up.  Thanks,  Meredith Staggers, MD, Mellody Drown    Cathlyn Parsons., PA-C 07/03/2016

## 2016-07-07 NOTE — Progress Notes (Signed)
Patient information reviewed and entered into eRehab system by Daiva Nakayama, RN, CRRN, [place] Coordinator.  Information including medical coding and functional independence measure will be reviewed and updated through discharge.     Per nursing patient/family was given "Data Collection Information Summary for Patients in Inpatient Rehabilitation Facilities with attached "Privacy Act [place] Records" upon admission.

## 2016-07-07 NOTE — Progress Notes (Signed)
Retta Diones, RN Rehab Admission Coordinator Signed Physical Medicine and Rehabilitation  PMR Pre-admission Date of Service: 07/04/2016 1:37 PM  Related encounter: ED to Hosp-Admission (Discharged) from 06/29/2016 in [facility]       [] Hide copied text PMR Admission Coordinator Pre-Admission Assessment  Patient: [name] is an 66 y.o., male MRN: [id number] DOB: 06-29-1950 Height: 5\' 7"  (170.2 cm) Weight: 111.1 kg (245 lb)                                                                                                                                                  Insurance Information HMO: No  PPO:       PCP:       IPA:       80/20:       OTHER:   PRIMARY:  Medicare A/B      Policy#:  [id number]      Subscriber:  Krista Blue CM Name:        Phone#:       Fax#:   Pre-Cert#:        Employer: Retired Benefits:  Phone #:       Name: Checked in [place]. Date: 07/03/10     Deduct: $1340      Out of Pocket Max: none      Life Max: unlimited CIR: 100%      SNF: 100 days Outpatient: 80%     Co-Pay: 20% Home Health: 100%      Co-Pay: none DME: 80%     Co-Pay: 20% Providers: patient's choice  Medicaid Application Date:        Case Manager:   Disability Application Date:        Case Worker:    Emergency Contact Information        Contact Information    Name Relation Home Work Mobile   Marolf,Sophona "[name]" Daughter [phone]     Henrik, Vidaurri Other [phone]       Current Medical History  Patient Admitting Diagnosis: L ACA infarct  History of Present Illness: A 66 y.o.right handed malewith history of hypertension, diabetes mellitus, CKD stage II, 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 Walks with a quad cane. He uses [employer]for transportation. Daughter in the area works.Presented 06/30/2016 with increasing lethargy over the past 3 days and  multiple falls with altered mental status. Reports of diarrhea for the past few days, WBC 23,000, sodium 132. Cranial CT scan negative. Chest x-ray with no acute disease. C. difficile specimen negative. Protocol initiated for suspect sepsis. MRI showed diffusion restriction within the left aspect of corpus colostomy extending into the left cingulate gyrus in the paramedian left frontal lobe compatible with acute early subacute infarct, left ACA distribution. 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 Occlusion or near occlusion the left anterior cerebral artery 1.5 cm beyond its origin. Echocardiogram with ejection fraction of [id number] grade 2 diastolic dysfunction. No wall motion abnormalities. Neurology consult at present maintain on aspirin 325 mg daily as well as Plavix for CVA prophylaxis3 months then Plavix alone. Subcutaneous Lovenox for DVT prophylaxis. WBC trending down 15,700.Tolerating a regular consistency diet.Physical and occupational therapy evaluations completed. M.D. has requested physical medicine rehabilitation consult. Patient to be admitted for a comprehensive inpatient rehabilitation program.    Total: 7=NIH  Past Medical History      Past Medical History:  Diagnosis Date  . 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 CVA (cerebral infarction) 01/03/2008   MRI: Acute 1 x 1.5 cm infarction affecting the left side of the pons.  . Diabetes mellitus   . DVT (deep venous thrombosis) ([place])   . Hypertension   . PAD (peripheral artery disease) ([place])   . Stroke [facility])    2008    Family History  family history includes Diabetes in his mother and sister; Heart attack in his mother; Hypertension in his brother, daughter, mother, sister, and son.  Prior Rehab/Hospitalizations: No recent rehab.  Patient tells me that  previous CVA was in 2009.  Has the patient had major surgery during 100 days prior to admission? No  Current Medications   Current Facility-Administered Medications:  .  acetaminophen (TYLENOL) tablet 650 mg, 650 mg, Oral, Q6H PRN **OR** acetaminophen (TYLENOL) suppository 650 mg, 650 mg, Rectal, Q6H PRN, Ivor Costa, MD .  aspirin tablet 325 mg, 325 mg, Oral, Daily, Doreatha Lew, MD, 325 mg at 07/04/16 1005 .  atorvastatin (LIPITOR) tablet 40 mg, 40 mg, Oral, QPM, Ivor Costa, MD, 40 mg at 07/03/16 1803 .  clopidogrel (PLAVIX) tablet 75 mg, 75 mg, Oral, Q breakfast, Ivor Costa, MD, 75 mg at 07/04/16 1005 .  enoxaparin (LOVENOX) injection 40 mg, 40 mg, Subcutaneous, Q24H, Ivor Costa, MD, 40 mg at 07/04/16 1005 .  hydrALAZINE (APRESOLINE) injection 5 mg, 5 mg, Intravenous, Q2H PRN, Doreatha Lew, MD .  insulin aspart (novoLOG) injection 0-5 Units, 0-5 Units, Subcutaneous, QHS, Ivor Costa, MD, 2 Units at 07/03/16 2317 .  insulin aspart (novoLOG) injection 0-9 Units, 0-9 Units, Subcutaneous, TID WC, Ivor Costa, MD, 1 Units at 07/04/16 1225 .  insulin detemir (LEVEMIR) injection 30 Units, 30 Units, Subcutaneous, Daily, Doreatha Lew, MD, 30 Units at 07/04/16 1005 .  ondansetron (ZOFRAN) tablet 4 mg, 4 mg, Oral, Q6H PRN **OR** ondansetron (ZOFRAN) injection 4 mg, 4 mg, Intravenous, Q6H PRN, Ivor Costa, MD .  sertraline (ZOLOFT) tablet 100 mg, 100 mg, Oral, Daily, Ivor Costa, MD, 100 mg at 07/04/16 1005 .  sodium chloride flush (NS) 0.9 % injection 3 mL, 3 mL, Intravenous, Q12H, Ivor Costa, MD, 3 mL at 07/04/16 1000  Patients Current Diet: Diet Carb Modified Fluid consistency: Thin; Room service appropriate? Yes Diet - low sodium heart healthy Diet Carb Modified  Precautions / Restrictions Precautions Precautions: Fall Precaution Comments: Rt hemiparesis Restrictions Weight Bearing Restrictions: No   Has the patient had 2 or more falls or a fall with injury in the past year?Yes.   Patient reports 2 falls with no injury.  Prior Activity Level Limited Community (1-2x/wk): Went out 1 X a week.  Home Assistive Devices / Equipment Home Assistive Devices/Equipment:  Cane (specify quad or straight) (quad) Home Equipment: Cane - quad, Bedside commode, Tub bench  Prior Device Use: Indicate devices/aids used by the patient prior to current illness, exacerbation or injury? Quad cane  Prior Functional Level Prior Function Level of Independence: Needs assistance Gait / Transfers Assistance Needed: walks with quad cane without assist on flat surfaces ADL's / Homemaking Assistance Needed: Pt states he bathes and dresses himself, does simple cooking.  Family helps with cleaning and brings groceries.  Uses [employer] for transportation.  Self Care: Did the patient need help bathing, dressing, using the toilet or eating?  Independent  Indoor Mobility: Did the patient need assistance with walking from room to room (with or without device)? Independent  Stairs: Did the patient need assistance with internal or external stairs (with or without device)? Independent  Functional Cognition: Did the patient need help planning regular tasks such as shopping or remembering to take medications? Independent.  Family does assist with cleaning, getting groceries and paying bills.  Current Functional Level Cognition  Overall Cognitive Status: History of cognitive impairments - at baseline Difficult to assess due to: Impaired communication Orientation Level: Oriented X4 General Comments: Pt does fairly well cognitively, alert and oriented following all commands.  Pt is slow to process and with decreased problem solving skillls.      Extremity Assessment (includes Sensation/Coordination)  Upper Extremity Assessment: RUE deficits/detail, Defer to OT evaluation RUE Deficits / Details: Uses as gross assist.  High tone.  Shoulder PROM to 90, painful to fully extend elbow and fingers.   RUE:  Unable to fully assess due to pain RUE Coordination: decreased fine motor, decreased gross motor  Lower Extremity Assessment: RLE deficits/detail RLE Deficits / Details: No movement noted RLE.  Noted increased tone. RLE Coordination: decreased gross motor    ADLs  Overall ADL's : Needs assistance/impaired Eating/Feeding: Set up, Sitting Grooming: Sitting, Minimal assistance Grooming Details (indicate cue type and reason): assist donning deodorant. Upper Body Bathing: Minimal assistance, Sitting Lower Body Bathing: Moderate assistance, Sit to/from stand Upper Body Dressing : Minimal assistance, Sitting Lower Body Dressing: Moderate assistance, Sit to/from stand Toilet Transfer: Minimal assistance, +2 for safety/equipment, BSC (quad cane) Toileting- Clothing Manipulation and Hygiene: Minimal assistance, Sit to/from stand, Sitting/lateral lean Functional mobility during ADLs: Minimal assistance, +2 for safety/equipment, Cueing for sequencing, Cueing for safety (quad cane) General ADL Comments: Pt overall not steady on feet and requires several cues for safety with quad cane when up on his feet.  Pt has difficulty reaching his feet.  Wears slip on bedroom shoes most of the time which may not be the best option given his RLE weakness.    Mobility  Overal bed mobility: Needs Assistance Bed Mobility: Supine to Sit Rolling: Mod assist Sidelying to sit: Max assist, HOB elevated Supine to sit: Min guard, HOB elevated Sit to sidelying: Mod assist, HOB elevated General bed mobility comments: Pt able to get to EOB with min/guard with increased time    Transfers  Overall transfer level: Needs assistance Equipment used: 2 person hand held assist Transfers: Sit to/from Stand Sit to Stand: Mod assist, +2 physical assistance Stand pivot transfers: Mod assist, +2 physical assistance General transfer comment: Pt able to take small little steps with MOD A of 2 to recliner for lunch      Ambulation / Gait / Stairs / Wheelchair Mobility  Ambulation/Gait Ambulation/Gait assistance: Min assist, +2 physical assistance, +2 safety/equipment Ambulation Distance (Feet): 30 Feet Assistive device: Quad cane Gait Pattern/deviations: Step-to  pattern, Decreased step length - right, Decreased stance time - right, Shuffle, Trunk flexed, Antalgic General Gait Details: deferred. Lunch had arrived Gait velocity: decr    Posture / Balance Balance Overall balance assessment: Needs assistance Sitting-balance support: No upper extremity supported, Feet supported Sitting balance-Leahy Scale: Fair Standing balance support: Single extremity supported Standing balance-Leahy Scale: Poor Standing balance comment: Pt must have outside support to remain standing safely    Special needs/care consideration BiPAP/CPAP No CPM No Continuous Drip IV No Dialysis No        Life Vest No Oxygen No Special Bed No Trach Size No Wound Vac (area) No      Skin                             Bowel mgmt: Last BM 07/04/15 Bladder mgmt: Voiding in urinal with no incontinence Diabetic mgmt Yes, on oral medications and insulin at home.   Previous Home Environment Living Arrangements: Alone  Lives With: Alone, Other (Comment) (family that assists PRN) Available Help at Discharge: Family, Available PRN/intermittently Type of Home: House Home Layout: One level Home Access: Level entry Bathroom Shower/Tub: Tub/shower unit, Architectural technologist: Standard Bathroom Accessibility: Yes How Accessible: Other (comment) (quad cane) Home Care Services: No  Discharge Living Setting Plans for Discharge Living Setting: Patient's home, Alone, House (Lives alone.) Type of Home at Discharge: House Discharge Home Layout: One level Discharge Home Access: Ramped entrance (Patient tells me he has a ramp.) Does the patient have any problems obtaining your medications?: No  Social/Family/Support Systems Patient  Roles: Parent (Has a daughter and 2 sons.) Contact Information: Mykal Bednarczyk - daughter - [phone] Anticipated Caregiver: Family Ability/Limitations of Caregiver: Dtr works. Caregiver Availability: Other (Comment) (Daughter aware of need for 24/7 care/supervision.) Discharge Plan Discussed with Primary Caregiver: Yes Is Caregiver In Agreement with Plan?: Yes Does Caregiver/Family have Issues with Lodging/Transportation while Pt is in Rehab?: No  Goals/Additional Needs Patient/Family Goal for Rehab: PT/OT min to mod assist, SLP supervision to min assist goals Expected length of stay: 20-28 days Cultural Considerations: None Dietary Needs: Carb mod, med cal, thin liquids Equipment Needs: TBD Pt/Family Agrees to Admission and willing to participate: Yes Program Orientation Provided & Reviewed with Pt/Caregiver Including Roles  & Responsibilities: Yes  Decrease burden of Care through IP rehab admission: N/A  Possible need for SNF placement upon discharge: Yes, if patient cannot progress to level where family can manage him at home will need SNF.  Patient Condition: This patient's condition remains as documented in the consult dated 07/03/16, in which the Rehabilitation Physician determined and documented that the patient's condition is appropriate for intensive rehabilitative care in an inpatient rehabilitation facility. Will admit to inpatient rehab today.  Preadmission Screen Completed By:  Retta Diones, 07/04/2016 1:48 PM ______________________________________________________________________   Discussed status with Dr. Naaman Plummer on 07/04/16 at 1350 and received telephone approval for admission today.  Admission Coordinator:  Retta Diones, time1350/Date2/2/18       Cosigned

## 2016-07-07 NOTE — Progress Notes (Signed)
Speech Language Pathology Daily Session Note  Patient Details  Name: [name] MRN: [id number] Date of Birth: [birth year]  Today's Date: 07/07/2016 SLP Individual Time: [id number] SLP Individual Time Calculation (min): 45 min  Short Term Goals: Week 1: SLP Short Term Goal 1 (Week 1): Patient will request help as needed for safety with completion of basic self-care tasks with Min assist question cues.  SLP Short Term Goal 2 (Week 1): Patient will self-monitor and correct errors during mildly complex problem solving tasks with Min assist question cues. SLP Short Term Goal 3 (Week 1): Patient will produce final sounds of words during sentence level verbal expression tasks with Mod assist mulimodal cues. SLP Short Term Goal 4 (Week 1): Patient will produce /l/ in all positions of words wtih Mod assist verbal and visual cues to elevate tongue.  SLP Short Term Goal 5 (Week 1): Patient will utilize lingual sweep to manage right sided buccal pocketing Mod I.   Skilled Therapeutic Interventions: Skilled treatment session focused on cognition goals. SLP facilitated session by providing Min A faded to supervision cues for safe completion of basic  ADL's (dressing, brushing teeth, combing hair). Pt required supervision for mildly complex calendar problem solving tasks and he was able to self-monitor and correct errors with supervision. Of note, pt was intelligible at the sentence level with supervision cues and ~ 85% accuracy. Pt was returned to room, left upright in wheelchair with safety belt donned and all needs within reach. Continue per current plan of care.      Function:    Cognition Comprehension Comprehension assist level: Follows basic conversation/direction with extra time/assistive device  Expression   Expression assist level: Expresses basic 75 - 89% of the time/requires cueing 10 - 24% of the time. Needs helper to occlude trach/needs to repeat words.  Social Interaction Social  Interaction assist level: Interacts appropriately 75 - 89% of the time - Needs redirection for appropriate language or to initiate interaction.  Problem Solving Problem solving assist level: Solves basic 75 - 89% of the time/requires cueing 10 - 24% of the time  Memory Memory assist level: Recognizes or recalls 75 - 89% of the time/requires cueing 10 - 24% of the time    Pain Pain Assessment Pain Assessment: No/denies pain  Therapy/Group: Individual Therapy  Teana Lindahl B. Rutherford Nail, M.S., CCC-SLP Speech-Language Pathologist   Vance Belcourt 07/07/2016, 9:14 AM

## 2016-07-08 ENCOUNTER — Inpatient Hospital Stay (HOSPITAL_COMMUNITY): Payer: Medicare Other | Admitting: Occupational Therapy

## 2016-07-08 ENCOUNTER — Inpatient Hospital Stay (HOSPITAL_COMMUNITY): Payer: Medicare Other | Admitting: Physical Therapy

## 2016-07-08 LAB — GLUCOSE, CAPILLARY
GLUCOSE-CAPILLARY: 153 mg/dL — AB (ref 65–99)
Glucose-Capillary: 257 mg/dL — ABNORMAL HIGH (ref 65–99)
Glucose-Capillary: 83 mg/dL (ref 65–99)
Glucose-Capillary: 97 mg/dL (ref 65–99)
Glucose-Capillary: 137 mg/dL — ABNORMAL HIGH (ref 65–99)

## 2016-07-08 MED ORDER — METFORMIN HCL 850 MG PO TABS
850.0000 mg | ORAL_TABLET | Freq: Every day | ORAL | Status: DC
Start: 2016-07-09 — End: 2016-07-10
  Administered 2016-07-09 – 2016-07-10 (×2): 850 mg via ORAL
  Filled 2016-07-08 (×2): qty 1

## 2016-07-08 NOTE — Progress Notes (Signed)
Occupational Therapy Session Note  Patient Details  Name: [name] MRN: [id number] Date of Birth: [birth year]  Today's Date: 07/08/2016 OT Individual Time: 1400-1430 OT Individual Time Calculation (min): 30 min    Short Term Goals: Week 1:  OT Short Term Goal 1 (Week 1): Pt will complete stand pivot transfer to toilet with close supervision using LRAD OT Short Term Goal 2 (Week 1): Pt will dress LB with min A sit <> stand OT Short Term Goal 3 (Week 1): Pt will dress UB using hemi technique with 1 VC for technique OT Short Term Goal 4 (Week 1): Pt will complete 3/3 toileting tasks with steadying assist  Skilled Therapeutic Interventions/Progress Updates:    Pt completed cleaning of peri area and doffing and donning new LB clothing during session.  Pt unable to access urinal in time per his report and soiled clothing.  Min assist for removal of shoes, pants, and soiled brief.  He was able to stand and wash off his private area and peri area with min steady assist.  Therapist applied barrier cream as well as new brief in standing.  Pt donned new sweat pants with min assist including suspenders, which he completed with supervision.  Max assist for donning right shoe with built in AFO.  Pt reports usually laying on the bed to start this as he cannot cross the RLE over the left knee and maintain in sitting without assistance.  He was able to donn the left shoe with setup and he has coiler shoe strings in both so they do not need to be tied.  Pt left in wheelchair with call button in reach and safety belt in place.    Therapy Documentation Precautions:  Precautions Precautions: Fall Precaution Comments: Rt hemiparesis Restrictions Weight Bearing Restrictions: No  Pain: Pain Assessment Pain Assessment: No/denies pain ADL: See Function Navigator for Current Functional Status.   Therapy/Group: Individual Therapy  Shirlie Enck OTR/L 07/08/2016, 4:30 PM

## 2016-07-08 NOTE — Progress Notes (Signed)
Physical Therapy Session Note  Patient Details  Name: MEKELL DITORO MRN: [id number] Date of Birth: 1950-08-11  Today's Date: 07/08/2016 PT Individual Time: [id number] PT Individual Time Calculation (min): 60 min   Short Term Goals: Week 1:  PT Short Term Goal 1 (Week 1): Pt will increase rolling with no rails to c/s. PT Short Term Goal 2 (Week 1): Pt will increase transfers in bed to S.  PT Short Term Goal 3 (Week 1): Pt will increase transfers to min A.  PT Short Term Goal 4 (Week 1): Pt will increase gait with WBQC to min A about 50 feet. PT Short Term Goal 5 (Week 1): Pt will ascend/descend 4 stairs with 2 rails and mod A.   Skilled Therapeutic Interventions/Progress Updates:  Pt was in W/C upon arrival. Pt wasn't complaining of any pain throughout therapy. Pt required supervision for sit-to-stand transfers. Pt self-propelled in w/c with hemi-pattern with LLE and LUE 160 ft to rehab gym. Pt ascended forward and descended forward 4 six inch steps using L rail with mod - max assist. Pt had LOB episode descending of stairs leading with LLE in attempting to self-correct by stepping backwards with LLE. Pt performed 2x10 sit-to-stand transfers on EOM for MNR and strengthening. Pt ambulated 120 ft x 2 steady assist with [facility] with verbal cues for sequence and proper use of DME. Pt performed 2x 2 min standing balance with no AD practicing with the dynavision (scored 76 and 86 points). Pt performed 6 min of standing balance with R hand  weightbearing on table for facilitating wrist and finger extension while playing checkers. Pt self-propelled 90 ft in W/C back to room. Pt was left in W/C with all needs within reach.      Therapy Documentation Precautions:  Precautions Precautions: Fall Precaution Comments: Rt hemiparesis Restrictions Weight Bearing Restrictions: No   See Function Navigator for Current Functional Status.   Therapy/Group: Individual Therapy  Rosendo Gros 07/08/2016, 4:22  PM

## 2016-07-08 NOTE — Progress Notes (Signed)
Occupational Therapy Session Note  Patient Details  Name: [name] MRN: [id number] Date of Birth: 1950-08-25  Today's Date: 07/08/2016 OT Individual Time: 1003-1100 OT Individual Time Calculation (min): 57 min    Short Term Goals: Week 1:  OT Short Term Goal 1 (Week 1): Pt will complete stand pivot transfer to toilet with close supervision using LRAD OT Short Term Goal 2 (Week 1): Pt will dress LB with min A sit <> stand OT Short Term Goal 3 (Week 1): Pt will dress UB using hemi technique with 1 VC for technique OT Short Term Goal 4 (Week 1): Pt will complete 3/3 toileting tasks with steadying assist  Skilled Therapeutic Interventions/Progress Updates:    Pt completed bathing and dressing during session.  Min assist for mobility to the shower bench with use of the quad cane.  He was able to bathe sit to stand with min assist.  Provided LH sponge with mod instructional cueing for washing his back, his left underarm, and his feet.  Mod assist to integrate the RUE into washing the left arm.  He was able to transfer out to the wheelchair for dressing tasks.  He was able to donn his pullover shirt with supervision but then returned to supine in the bed for donning pants and sock over the RLE by crossing it over the left knee.  Returned to sitting EOB with min assist to donn pant over the LLE and stand to pull over hips.  Pt also donned suspenders on his pants as well before placing them over his LEs.  Supervision for donning gripper socks as well, with the LUE performed in supine.  Pt transferred into bed for part of dressing for therapist to see how he did this at home before session.  Completed session with oral hygiene at the sink with setup.  Pt left in wheelchair with safety belt in place and call button and phone in reach.   Therapy Documentation Precautions:  Precautions Precautions: Fall Precaution Comments: Rt hemiparesis Restrictions Weight Bearing Restrictions: No  Pain: Pain  Assessment Pain Assessment: No/denies pain Pain Score: 0-No pain ADL: See Function Navigator for Current Functional Status.   Therapy/Group: Individual Therapy  Ruthanna Macchia OTR/L 07/08/2016, 12:17 PM

## 2016-07-08 NOTE — Progress Notes (Signed)
Subjective/Complaints: Patient without new issues overnight. Good appetite. Denies pains  Review systems negative chest pain. Negative shortness of breath. Negative nausea, vomiting, diarrhea, constipation  Objective: Vital Signs: Blood pressure (!) 158/51, pulse (!) 54, temperature 98.8 F (37.1 C), temperature source Oral, resp. rate 18, height 5' 7"  (1.702 m), weight 109 kg (240 lb 6.4 oz), SpO2 96 %. No results found. Results for orders placed or performed during the hospital encounter of 07/04/16 (from the past 72 hour(s))  Glucose, capillary     Status: Abnormal   Collection Time: 07/05/16 11:35 AM  Result Value Ref Range   Glucose-Capillary 283 (H) 65 - 99 mg/dL  Glucose, capillary     Status: Abnormal   Collection Time: 07/05/16  8:38 PM  Result Value Ref Range   Glucose-Capillary 258 (H) 65 - 99 mg/dL  Glucose, capillary     Status: Abnormal   Collection Time: 07/06/16  6:36 AM  Result Value Ref Range   Glucose-Capillary 124 (H) 65 - 99 mg/dL  Glucose, capillary     Status: Abnormal   Collection Time: 07/06/16 11:43 AM  Result Value Ref Range   Glucose-Capillary 164 (H) 65 - 99 mg/dL  Glucose, capillary     Status: Abnormal   Collection Time: 07/06/16  4:30 PM  Result Value Ref Range   Glucose-Capillary 196 (H) 65 - 99 mg/dL  Glucose, capillary     Status: Abnormal   Collection Time: 07/06/16  8:35 PM  Result Value Ref Range   Glucose-Capillary 218 (H) 65 - 99 mg/dL  CBC WITH DIFFERENTIAL     Status: Abnormal   Collection Time: 07/07/16  6:40 AM  Result Value Ref Range   WBC 12.5 (H) 4.0 - 10.5 K/uL   RBC 3.82 (L) 4.22 - 5.81 MIL/uL   Hemoglobin 11.2 (L) 13.0 - 17.0 g/dL   HCT 34.9 (L) 39.0 - 52.0 %   MCV 91.4 78.0 - 100.0 fL   MCH 29.3 26.0 - 34.0 pg   MCHC 32.1 30.0 - 36.0 g/dL   RDW 12.2 11.5 - 15.5 %   Platelets 159 150 - 400 K/uL   Neutrophils Relative % 66 %   Neutro Abs 8.3 (H) 1.7 - 7.7 K/uL   Lymphocytes Relative 23 %   Lymphs Abs 2.8 0.7 - 4.0  K/uL   Monocytes Relative 7 %   Monocytes Absolute 0.9 0.1 - 1.0 K/uL   Eosinophils Relative 3 %   Eosinophils Absolute 0.4 0.0 - 0.7 K/uL   Basophils Relative 1 %   Basophils Absolute 0.1 0.0 - 0.1 K/uL  Comprehensive metabolic panel     Status: Abnormal   Collection Time: 07/07/16  6:40 AM  Result Value Ref Range   Sodium 137 135 - 145 mmol/L   Potassium 4.1 3.5 - 5.1 mmol/L   Chloride 106 101 - 111 mmol/L   CO2 28 22 - 32 mmol/L   Glucose, Bld 101 (H) 65 - 99 mg/dL   BUN 13 6 - 20 mg/dL   Creatinine, Ser 0.97 0.61 - 1.24 mg/dL   Calcium 8.3 (L) 8.9 - 10.3 mg/dL   Total Protein 5.7 (L) 6.5 - 8.1 g/dL   Albumin 2.4 (L) 3.5 - 5.0 g/dL   AST 36 15 - 41 U/L   ALT 46 17 - 63 U/L   Alkaline Phosphatase 50 38 - 126 U/L   Total Bilirubin 0.9 0.3 - 1.2 mg/dL   GFR calc non Af Amer >60 >60 mL/min  GFR calc Af Amer >60 >60 mL/min    Comment: (NOTE) The eGFR has been calculated using the CKD EPI equation. This calculation has not been validated in all clinical situations. eGFR's persistently <60 mL/min signify possible Chronic Kidney Disease.    Anion gap 3 (L) 5 - 15  Glucose, capillary     Status: None   Collection Time: 07/07/16  6:57 AM  Result Value Ref Range   Glucose-Capillary 91 65 - 99 mg/dL  Glucose, capillary     Status: Abnormal   Collection Time: 07/07/16 11:58 AM  Result Value Ref Range   Glucose-Capillary 120 (H) 65 - 99 mg/dL  Glucose, capillary     Status: Abnormal   Collection Time: 07/07/16  4:38 PM  Result Value Ref Range   Glucose-Capillary 153 (H) 65 - 99 mg/dL  Glucose, capillary     Status: Abnormal   Collection Time: 07/07/16  9:09 PM  Result Value Ref Range   Glucose-Capillary 257 (H) 65 - 99 mg/dL   Comment 1 Notify RN   Glucose, capillary     Status: None   Collection Time: 07/08/16  6:41 AM  Result Value Ref Range   Glucose-Capillary 83 65 - 99 mg/dL   Comment 1 Notify RN      General: No acute distress Mood and affect are  appropriate Heart: Regular rate and rhythm no rubs murmurs or extra sounds Lungs: Clear to auscultation, breathing unlabored, no rales or wheezes Abdomen: Positive bowel sounds, soft nontender to palpation, nondistended Extremities: No clubbing, cyanosis, or edema Skin: No evidence of breakdown, no evidence of rash Neurologic: Cranial nerves II through XII intact, motor strength is 5/5 in left deltoid, bicep, tricep, grip, hip flexor, knee extensors, ankle dorsiflexor and plantar flexor 2 minus in the right deltoid, bicep, tricep, trace finger flexors, trace hip flexor 2 minus, knee extensor, trace ankle dorsiflexor  Cerebellar exam normal finger to nose to finger as well as heel to shin in left upper and lower extremities, unable to perform due to motor weakness on the right side Musculoskeletal: Full range of motion in all 4 extremities. No joint swelling   Assessment/Plan: 1. Functional deficits secondary to left ACA infarct with right hemiparesis which require 3+ hours per day of interdisciplinary therapy in a comprehensive inpatient rehab setting. Physiatrist is providing close team supervision and 24 hour management of active medical problems listed below. Physiatrist and rehab team continue to assess barriers to discharge/monitor patient progress toward functional and medical goals. FIM: Function - Bathing Position: Shower Body parts bathed by patient: Left arm, Chest, Abdomen, Front perineal area, Buttocks, Right upper leg, Left upper leg Body parts bathed by helper: Right lower leg, Left lower leg, Back, Right arm Assist Level: Touching or steadying assistance(Pt > 75%)  Function- Upper Body Dressing/Undressing What is the patient wearing?: Pull over shirt/dress Pull over shirt/dress - Perfomed by patient: Thread/unthread right sleeve, Thread/unthread left sleeve, Put head through opening, Pull shirt over trunk Pull over shirt/dress - Perfomed by helper: Thread/unthread right  sleeve, Thread/unthread left sleeve, Put head through opening, Pull shirt over trunk Assist Level: Supervision or verbal cues Function - Lower Body Dressing/Undressing What is the patient wearing?: Pants, Non-skid slipper socks Position: Wheelchair/chair at sink Pants- Performed by patient: Thread/unthread right pants leg, Thread/unthread left pants leg, Pull pants up/down Pants- Performed by helper: Thread/unthread right pants leg, Thread/unthread left pants leg, Pull pants up/down Non-skid slipper socks- Performed by patient: Don/doff left sock Non-skid slipper socks- Performed by  helper: Don/doff right sock Assist for footwear: Maximal assist Assist for lower body dressing: Touching or steadying assistance (Pt > 75%)  Function - Toileting Toileting activity did not occur: No continent bowel/bladder event (using urinal, no BM) Toileting steps completed by patient: Adjust clothing prior to toileting, Performs perineal hygiene, Adjust clothing after toileting Toileting steps completed by helper: Adjust clothing after toileting Toileting Assistive Devices: Grab bar or rail  Function - Air cabin crew transfer activity did not occur: N/A (no BM, uses urinal ) Toilet transfer assistive device: Grab bar Assist level to toilet: Moderate assist (Pt 50 - 74%/lift or lower) Assist level from toilet: Moderate assist (Pt 50 - 74%/lift or lower)  Function - Chair/bed transfer Chair/bed transfer method: Stand pivot Chair/bed transfer assist level: Touching or steadying assistance (Pt > 75%) Chair/bed transfer assistive device: Armrests, Cane  Function - Locomotion: Wheelchair Will patient use wheelchair at discharge?: No Type: Manual Max wheelchair distance: 150 Assist Level: Supervision or verbal cues Wheel 50 feet with 2 turns activity did not occur: Safety/medical concerns Assist Level: Supervision or verbal cues Wheel 150 feet activity did not occur: Safety/medical  concerns Assist Level: Supervision or verbal cues Function - Locomotion: Ambulation Assistive device: Cane-quad Max distance: 70 Assist level: Touching or steadying assistance (Pt > 75%) Assist level: Touching or steadying assistance (Pt > 75%) Walk 50 feet with 2 turns activity did not occur: Safety/medical concerns Assist level: Touching or steadying assistance (Pt > 75%) Walk 150 feet activity did not occur: Safety/medical concerns Walk 10 feet on uneven surfaces activity did not occur: Safety/medical concerns  Function - Comprehension Comprehension: Auditory Comprehension assist level: Follows basic conversation/direction with extra time/assistive device  Function - Expression Expression: Verbal Expression assist level: Expresses basic 75 - 89% of the time/requires cueing 10 - 24% of the time. Needs helper to occlude trach/needs to repeat words.  Function - Social Interaction Social Interaction assist level: Interacts appropriately 75 - 89% of the time - Needs redirection for appropriate language or to initiate interaction.  Function - Problem Solving Problem solving assist level: Solves basic 50 - 74% of the time/requires cueing 25 - 49% of the time  Function - Memory Memory assist level: Recognizes or recalls 50 - 74% of the time/requires cueing 25 - 49% of the time Patient normally able to recall (first 3 days only): Staff names and faces, Current season, That he or she is in a hospital, Location of own room  Medical Problem List and Plan: 1. 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 DVT Prophylaxis/Anticoagulation: Subcutaneous Lovenox. Monitor platelet counts and any signs of bleeding 3. Pain Management: Tylenol as needed -consider baclofen trial for spasticity 4. Mood:  Zoloft 100 mg daily 5. Neuropsych: This patient iscapable of making decisions on hisown behalf. 6. Skin/Wound Care: Routine skin checks 7. Fluids/Electrolytes/Nutrition: Routine I&O with follow-up chemistries 8.Diabetes mellitus of peripheral neuropathy. Hemoglobin A1c 6.9.Levemir 30 units daily. Check blood sugars before meals and at bedtime. Diabetic teaching, good a.m. CBGs, added metformin 500 mg 2/5 with breakfast to help with daytime CBGs  No apparent improvement will increase to 880m  CBG (last 3)   Recent Labs  07/07/16 1638 07/07/16 2109 07/08/16 0641  GLUCAP 153* 257* 83       9.CRI stage II. Follow-up chemistries upon admit. 10.Hypertension. Permissive hypertension. Patient on Coreg  25 mg twice a day, HCTZ 25 mg daily, lisinopril 40 mg daily, Aldactone 50 mg daily, Norvasc 10 mg daily, Revatio 60 mg daily prior to admission. Resume amlodipine 5 mg 2/5 we'll slowly titrate other meds as needed, no dosage change for now Vitals:   07/07/16 1424 07/08/16 0354  BP: (!) 153/48 (!) 158/51  Pulse: 67 (!) 54  Resp: 18 18  Temp: 98.1 F (36.7 C) 98.8 F (37.1 C)   11.Hyperlipidemia. Lipitor 12.Question medical compliance. Provide education 13. Leukocytosis, improving 15.7 on 2/2, 12.5 on 2/5 14. Hypoalbuminemia. Moderate, pro-stat LOS (Days) 4 A FACE TO FACE EVALUATION WAS PERFORMED  Tawfiq Favila E 07/08/2016, 8:45 AM

## 2016-07-08 NOTE — Progress Notes (Signed)
Occupational Therapy Session Note  Patient Details  Name: [name] MRN: [id number] Date of Birth: July 18, 1950  Today's Date: 07/08/2016 OT Individual Time: [id number] OT Individual Time Calculation (min): 44 min    Short Term Goals: Week 1:  OT Short Term Goal 1 (Week 1): Pt will complete stand pivot transfer to toilet with close supervision using LRAD OT Short Term Goal 2 (Week 1): Pt will dress LB with min A sit <> stand OT Short Term Goal 3 (Week 1): Pt will dress UB using hemi technique with 1 VC for technique OT Short Term Goal 4 (Week 1): Pt will complete 3/3 toileting tasks with steadying assist  Skilled Therapeutic Interventions/Progress Updates:    Upon entering the room, pt seated in wheelchair awaiting therapist with no c/o pain. Pt transferred from wheelchair >bed with steady assistance. Pt laying in supine and crossing ankle to opposite knee to don B shoes and R AFO. Pt needing steady assistance for balance once seated on EOB and bending forward to fasten. Pt transferred back to wheelchair and propelled self utilizing hemiplegic technique and min verbal cues in order to not hit items on R side. OT educated pt on use of quad cane for kitchen mobility safely. Pt has stationary table between refrigerator and stove. Pt remonstrated the ability to obtain items, place on table, and transfer to other side of kitchen safely with min verbal cues for technique. Also discussed use of counter to slide items from one side to the next with pt returning demonstration. Pt returned to room at end of session with call bell and all needed items within reach.   Therapy Documentation Precautions:  Precautions Precautions: Fall Precaution Comments: Rt hemiparesis Restrictions Weight Bearing Restrictions: No General:   Vital Signs: Therapy Vitals Temp: 98.5 F (36.9 C) Temp Source: Oral Pulse Rate: 64 Resp: 20 BP: (!) 165/65 Patient Position (if appropriate): Sitting Oxygen  Therapy SpO2: 98 % O2 Device: Not Delivered Pain:   ADL:   Exercises:   Other Treatments:    See Function Navigator for Current Functional Status.   Therapy/Group: Individual Therapy  Gypsy Decant 07/08/2016, 4:26 PM

## 2016-07-09 ENCOUNTER — Inpatient Hospital Stay (HOSPITAL_COMMUNITY): Payer: Medicare Other | Admitting: Physical Therapy

## 2016-07-09 ENCOUNTER — Inpatient Hospital Stay (HOSPITAL_COMMUNITY): Payer: Medicare Other | Admitting: Speech Pathology

## 2016-07-09 ENCOUNTER — Inpatient Hospital Stay (HOSPITAL_COMMUNITY): Payer: Medicare Other | Admitting: Occupational Therapy

## 2016-07-09 LAB — GLUCOSE, CAPILLARY
GLUCOSE-CAPILLARY: 103 mg/dL — AB (ref 65–99)
GLUCOSE-CAPILLARY: 105 mg/dL — AB (ref 65–99)
Glucose-Capillary: 192 mg/dL — ABNORMAL HIGH (ref 65–99)

## 2016-07-09 NOTE — Progress Notes (Signed)
Social Work Patient ID: [name], male   DOB: Sep 12, 1950, 66 y.o.   MRN: [id number]  Met with pt to discuss team conference goals-supervision-mod/i level and target discharge date 2/17. Pt was hoping it would be sooner, made aware if progresses more quickly can move up discharge date. Main issue is his safety at home and decreasing his fall risk. He feels he is doing well And is getting back to his baseline functioning. He will work with therapy team and see how much progress he can make while here.

## 2016-07-09 NOTE — Progress Notes (Signed)
Physical Therapy Session Note  Patient Details  Name: [name] MRN: [id number] Date of Birth: [birth year]  Today's Date: 07/09/2016 PT Individual Time: [phone] [id number] PT Individual Time Calculation (min): 44 min and 30 MIN   Short Term Goals: Week 1:  PT Short Term Goal 1 (Week 1): Pt will increase rolling with no rails to c/s. PT Short Term Goal 2 (Week 1): Pt will increase transfers in bed to S.  PT Short Term Goal 3 (Week 1): Pt will increase transfers to min A.  PT Short Term Goal 4 (Week 1): Pt will increase gait with WBQC to min A about 50 feet. PT Short Term Goal 5 (Week 1): Pt will ascend/descend 4 stairs with 2 rails and mod A.   Skilled Therapeutic Interventions/Progress Updates:   Pt received supine in bed and agreeable to PT. Supine>sit transfer with close supervision assist, heavy use of rails on bed and moderate cues for improved posture in sitting to to prevent posterior LOB. '  Dressing EOB with supervision-min assist to manage pants. Patient requested to wear footwear with Double upright AFO. PT provided max assist to done shoes for time management.   Stand pivot transfer with close supervision assist and LBQC. Min cues for anterior weight shift and proper use of AD.   WC mobility in hall with L hemi technique and supervision assist [id number]. Min cues for doorway management and increased use of LUE to improve speed.   Gait training instructed by PT x [id number] [facility]And RLE AFO. PT provided close supervision assist- min assist with occasional R knee instability and poor foot clearance.   Sit<>supine with supervision assist from PT and min cues for sequencing and technique. Supine NMR including hip adduction, clam shells, SAQ, and Bridges all completed x 10   [facility] to [facility] and [facility] reach and all needs met.     Session 2: Pt received sitting in WC and agreeable to PT  WC mobility through hall x [id number] with L  hemi technique. Supervision assist from PT for wc navigation in tight space of room .   Standing balance with weight shifting L and R to reach for horse shoe on basketball goal on R then pass to L hand in order to hand to therapist. Completed x 2 bouts with min assist from PT to improve stability of the R knee and increase weight shift to the LR   Gait training x [id number] [facility] intermittent supervision-min assist from PT. Min cues from PT also provided for improved step length and height on the R LE to prevent foot drag and improve safety.   Patient returned to room and [facility] reach and all needs met.            Therapy Documentation Precautions:  Precautions Precautions: Fall Precaution Comments: Rt hemiparesis Restrictions Weight Bearing Restrictions: No Vital Signs: Therapy Vitals Temp: 98.5 F (36.9 C) Temp Source: Oral Pulse Rate: 70 Resp: 18 BP: (!) 160/62 Patient Position (if appropriate): Lying Oxygen Therapy SpO2: 99 % O2 Device: Not Delivered Pain:   0/10   See Function Navigator for Current Functional Status.   Therapy/Group: Individual Therapy  AustLorie Phenix/2018, 7:47 AM

## 2016-07-09 NOTE — Progress Notes (Signed)
Subjective/Complaints: Seen in physical therapy. Has an AFO, double metal upright since prior stroke 2008.  Review systems negative chest pain. Negative shortness of breath. Negative nausea, vomiting, diarrhea, constipation  Objective: Vital Signs: Blood pressure (!) 160/62, pulse 70, temperature 98.5 F (36.9 C), temperature source Oral, resp. rate 18, height _0  (1.702 m), weight 109 kg (240 lb 6.4 oz), SpO2 99 %. No results found. Results for orders placed or performed during the hospital encounter of 07/04/16 (from the past 72 hour(s))  Glucose, capillary     Status: Abnormal   Collection Time: 07/06/16 11:43 AM  Result Value Ref Range   Glucose-Capillary 164 (H) 65 - 99 mg/dL  Glucose, capillary     Status: Abnormal   Collection Time: 07/06/16  4:30 PM  Result Value Ref Range   Glucose-Capillary 196 (H) 65 - 99 mg/dL  Glucose, capillary     Status: Abnormal   Collection Time: 07/06/16  8:35 PM  Result Value Ref Range   Glucose-Capillary 218 (H) 65 - 99 mg/dL  CBC WITH DIFFERENTIAL     Status: Abnormal   Collection Time: 07/07/16  6:40 AM  Result Value Ref Range   WBC 12.5 (H) 4.0 - 10.5 K/uL   RBC 3.82 (L) 4.22 - 5.81 MIL/uL   Hemoglobin 11.2 (L) 13.0 - 17.0 g/dL   HCT 34.9 (L) 39.0 - 52.0 %   MCV 91.4 78.0 - 100.0 fL   MCH 29.3 26.0 - 34.0 pg   MCHC 32.1 30.0 - 36.0 g/dL   RDW 12.2 11.5 - 15.5 %   Platelets 159 150 - 400 K/uL   Neutrophils Relative % 66 %   Neutro Abs 8.3 (H) 1.7 - 7.7 K/uL   Lymphocytes Relative 23 %   Lymphs Abs 2.8 0.7 - 4.0 K/uL   Monocytes Relative 7 %   Monocytes Absolute 0.9 0.1 - 1.0 K/uL   Eosinophils Relative 3 %   Eosinophils Absolute 0.4 0.0 - 0.7 K/uL   Basophils Relative 1 %   Basophils Absolute 0.1 0.0 - 0.1 K/uL  Comprehensive metabolic panel     Status: Abnormal   Collection Time: 07/07/16  6:40 AM  Result Value Ref Range   Sodium 137 135 - 145 mmol/L   Potassium 4.1 3.5 - 5.1 mmol/L   Chloride 106 101 - 111 mmol/L   CO2  28 22 - 32 mmol/L   Glucose, Bld 101 (H) 65 - 99 mg/dL   BUN 13 6 - 20 mg/dL   Creatinine, Ser 0.97 0.61 - 1.24 mg/dL   Calcium 8.3 (L) 8.9 - 10.3 mg/dL   Total Protein 5.7 (L) 6.5 - 8.1 g/dL   Albumin 2.4 (L) 3.5 - 5.0 g/dL   AST 36 15 - 41 U/L   ALT 46 17 - 63 U/L   Alkaline Phosphatase 50 38 - 126 U/L   Total Bilirubin 0.9 0.3 - 1.2 mg/dL   GFR calc non Af Amer >60 >60 mL/min   GFR calc Af Amer >60 >60 mL/min    Comment: (NOTE) The eGFR has been calculated using the CKD EPI equation. This calculation has not been validated in all clinical situations. eGFR's persistently <60 mL/min signify possible Chronic Kidney Disease.    Anion gap 3 (L) 5 - 15  Glucose, capillary     Status: None   Collection Time: 07/07/16  6:57 AM  Result Value Ref Range   Glucose-Capillary 91 65 - 99 mg/dL  Glucose, capillary  Status: Abnormal   Collection Time: 07/07/16 11:58 AM  Result Value Ref Range   Glucose-Capillary 120 (H) 65 - 99 mg/dL  Glucose, capillary     Status: Abnormal   Collection Time: 07/07/16  4:38 PM  Result Value Ref Range   Glucose-Capillary 153 (H) 65 - 99 mg/dL  Glucose, capillary     Status: Abnormal   Collection Time: 07/07/16  9:09 PM  Result Value Ref Range   Glucose-Capillary 257 (H) 65 - 99 mg/dL   Comment 1 Notify RN   Glucose, capillary     Status: None   Collection Time: 07/08/16  6:41 AM  Result Value Ref Range   Glucose-Capillary 83 65 - 99 mg/dL   Comment 1 Notify RN   Glucose, capillary     Status: None   Collection Time: 07/08/16 12:01 PM  Result Value Ref Range   Glucose-Capillary 97 65 - 99 mg/dL  Glucose, capillary     Status: Abnormal   Collection Time: 07/08/16  4:45 PM  Result Value Ref Range   Glucose-Capillary 137 (H) 65 - 99 mg/dL  Glucose, capillary     Status: Abnormal   Collection Time: 07/08/16  8:57 PM  Result Value Ref Range   Glucose-Capillary 192 (H) 65 - 99 mg/dL   Comment 1 Notify RN   Glucose, capillary     Status: Abnormal    Collection Time: 07/09/16  6:19 AM  Result Value Ref Range   Glucose-Capillary 103 (H) 65 - 99 mg/dL     General: No acute distress Mood and affect are appropriate Heart: Regular rate and rhythm no rubs murmurs or extra sounds Lungs: Clear to auscultation, breathing unlabored, no rales or wheezes Abdomen: Positive bowel sounds, soft nontender to palpation, nondistended Extremities: No clubbing, cyanosis, or edema Skin: No evidence of breakdown, no evidence of rash Neurologic: Cranial nerves II through XII intact, motor strength is 5/5 in left deltoid, bicep, tricep, grip, hip flexor, knee extensors, ankle dorsiflexor and plantar flexor 2 minus in the right deltoid, bicep, tricep, trace finger flexors, trace hip flexor 2 minus, knee extensor, trace ankle dorsiflexor [place] 3, finger flexors, wrist flexor and elbow flexor on the right side. Cerebellar exam normal finger to nose to finger as well as heel to shin in left upper and lower extremities, unable to perform due to motor weakness on the right side Musculoskeletal: Full range of motion in all 4 extremities. No joint swelling   Assessment/Plan: 1. Functional deficits secondary to left ACA infarct with right hemiparesis which require 3+ hours per day of interdisciplinary therapy in a comprehensive inpatient rehab setting. Physiatrist is providing close team supervision and 24 hour management of active medical problems listed below. Physiatrist and rehab team continue to assess barriers to discharge/monitor patient progress toward functional and medical goals. FIM: Function - Bathing Position: Shower Body parts bathed by patient: Left arm, Chest, Abdomen, Front perineal area, Buttocks, Right upper leg, Left upper leg, Right lower leg, Left lower leg, Back Body parts bathed by helper: Right arm Assist Level: Touching or steadying assistance(Pt > 75%)  Function- Upper Body Dressing/Undressing What is the patient wearing?: Pull over  shirt/dress Pull over shirt/dress - Perfomed by patient: Thread/unthread right sleeve, Thread/unthread left sleeve, Put head through opening, Pull shirt over trunk Pull over shirt/dress - Perfomed by helper: Thread/unthread right sleeve, Thread/unthread left sleeve, Put head through opening, Pull shirt over trunk Assist Level: Supervision or verbal cues Function - Lower Body Dressing/Undressing What is the patient  wearing?: Shoes, AFO Position: Wheelchair/chair at sink Pants- Performed by patient: Thread/unthread right pants leg, Thread/unthread left pants leg, Pull pants up/down Pants- Performed by helper: Thread/unthread right pants leg, Thread/unthread left pants leg, Pull pants up/down Non-skid slipper socks- Performed by patient: Don/doff left sock, Don/doff right sock Non-skid slipper socks- Performed by helper: Don/doff right sock Shoes - Performed by patient: Don/doff right shoe, Don/doff left shoe AFO - Performed by patient: Don/doff right AFO Assist for footwear: Supervision/touching assist Assist for lower body dressing: Touching or steadying assistance (Pt > 75%)  Function - Toileting Toileting activity did not occur: No continent bowel/bladder event (using urinal, no BM) Toileting steps completed by patient: Adjust clothing prior to toileting Toileting steps completed by helper: Performs perineal hygiene, Adjust clothing after toileting Toileting Assistive Devices: Grab bar or rail Assist level: Touching or steadying assistance (Pt.75%)  Function - Air cabin crew transfer activity did not occur: N/A (no BM, uses urinal ) Toilet transfer assistive device: Grab bar Assist level to toilet: Moderate assist (Pt 50 - 74%/lift or lower) Assist level from toilet: Moderate assist (Pt 50 - 74%/lift or lower)  Function - Chair/bed transfer Chair/bed transfer method: Ambulatory Chair/bed transfer assist level: Touching or steadying assistance (Pt > 75%) Chair/bed transfer  assistive device: Armrests, Cane Chair/bed transfer details: Verbal cues for technique, Verbal cues for safe use of DME/AE, Manual facilitation for weight shifting, Verbal cues for sequencing  Function - Locomotion: Wheelchair Will patient use wheelchair at discharge?: No Type: Manual Max wheelchair distance: 150 ft Assist Level: Supervision or verbal cues Wheel 50 feet with 2 turns activity did not occur: Safety/medical concerns Assist Level: Supervision or verbal cues Wheel 150 feet activity did not occur: Safety/medical concerns Assist Level: Supervision or verbal cues Turns around,maneuvers to table,bed, and toilet,negotiates 3% grade,maneuvers on rugs and over doorsills: No Function - Locomotion: Ambulation Assistive device: Cane-quad Max distance: 120 ft Assist level: Touching or steadying assistance (Pt > 75%) Assist level: Touching or steadying assistance (Pt > 75%) Walk 50 feet with 2 turns activity did not occur: Safety/medical concerns Assist level: Touching or steadying assistance (Pt > 75%) Walk 150 feet activity did not occur: Safety/medical concerns Walk 10 feet on uneven surfaces activity did not occur: Safety/medical concerns  Function - Comprehension Comprehension: Auditory Comprehension assist level: Follows basic conversation/direction with extra time/assistive device  Function - Expression Expression: Verbal Expression assist level: Expresses basic 50 - 74% of the time/requires cueing 25 - 49% of the time. Needs to repeat parts of sentences.  Function - Social Interaction Social Interaction assist level: Interacts appropriately 90% of the time - Needs monitoring or encouragement for participation or interaction.  Function - Problem Solving Problem solving assist level: Solves basic 90% of the time/requires cueing < 10% of the time  Function - Memory Memory assist level: Recognizes or recalls 75 - 89% of the time/requires cueing 10 - 24% of the time Patient  normally able to recall (first 3 days only): Staff names and faces, Current season, That he or she is in a hospital, Location of own room  Medical Problem List and Plan: 1. 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 Formulized individual treatment plan based on medical history, underlying problem and comorbidities. 2. DVT Prophylaxis/Anticoagulation:  Subcutaneous Lovenox. Monitor platelet counts and any signs of bleeding 3. Pain Management: Tylenol as needed -consider baclofen trial for spasticity, have discussed Botox as well 4. Mood: Zoloft 100 mg daily 5. Neuropsych: This patient iscapable of making decisions on hisown behalf. 6. Skin/Wound Care: Routine skin checks 7. Fluids/Electrolytes/Nutrition: Routine I&O with follow-up chemistries 8.Diabetes mellitus of peripheral neuropathy. Hemoglobin A1c 6.9.Levemir 30 units daily. Check blood sugars before meals and at bedtime. Diabetic teaching, good a.m. CBGs, added metformin 500 mg 2/5 with breakfast to help with daytime CBGs  No apparent improvement will increase to 854m, we'll monitor on current dose before any other adjustments  CBG (last 3)   Recent Labs  07/08/16 1645 07/08/16 2057 07/09/16 0619  GLUCAP 137* 192* 103*       9.CRI stage II. Follow-up chemistries upon admit. 10.Hypertension. Permissive hypertension. Patient on Coreg 25 mg twice a day, HCTZ 25 mg daily, lisinopril 40 mg daily, Aldactone 50 mg daily, Norvasc 10 mg daily, Revatio 60 mg daily prior to admission. Resumed amlodipine 5 mg 2/5, increase to 173mVitals:   07/08/16 1530 07/09/16 0444  BP: (!) 165/65 (!) 160/62  Pulse: 64  70  Resp: 20 18  Temp: 98.5 F (36.9 C) 98.5 F (36.9 C)   11.Hyperlipidemia. Lipitor 12.Question medical compliance. Provide education 13. Leukocytosis, improving 15.7 on 2/2, 12.5 on 2/5, remains afebrile 14. Hypoalbuminemia. Moderate, pro-stat LOS (Days) 5 A FACE TO FACE EVALUATION WAS PERFORMED  KIRSTEINS,ANDREW E 07/09/2016, 9:09 AM

## 2016-07-09 NOTE — Patient Care Conference (Signed)
Inpatient RehabilitationTeam Conference and Plan of Care Update Date: 07/09/2016   Time: 10:48 AM    Patient Name: [name]      Medical Record Number: [id number]  Date of Birth: Sep 26, 1950 Sex: Male         Room/Bed: 4W10C/4W10C-01 Payor Info: Payor: MEDICARE / Plan: MEDICARE PART A AND B / Product Type: *No Product type* /    Admitting Diagnosis: l CVA  Admit Date/Time:  07/04/2016  4:55 PM Admission Comments: No comment available   Primary Diagnosis:  Cerebrovascular small vessel disease Principal Problem: Cerebrovascular small vessel disease  Patient Active Problem List   Diagnosis Date Noted  . Cerebrovascular small vessel disease 07/04/2016  . Aphasia due to recent cerebral infarction 07/04/2016  . Cerebrovascular accident (CVA) ([place])   . Altered mental status 06/30/2016  . Fall 06/30/2016  . Acute encephalopathy 06/30/2016  . Sepsis ([place]) 06/30/2016  . Diarrhea 06/30/2016  . Acute renal failure superimposed on stage 2 chronic kidney disease ([place]) 06/30/2016  . Hemiparesis affecting right side as late effect of stroke ([place]) 10/23/2015  . PAD (peripheral artery disease) ([place]) 10/20/2011  . Hyperlipidemia 02/21/2010  . CEREBROVASCULAR ACCIDENT, HX OF 12/22/2008  . Diabetes mellitus, type II ([place]) 07/30/2006  . OBESITY, NOS 07/30/2006  . Essential hypertension, benign 07/30/2006    Expected Discharge Date: Expected Discharge Date: 07/19/16  Team Members Present: Physician leading conference: Dr. Alysia Penna Social Worker Present: Ovidio Kin, LCSW Nurse Present: Dorthula Nettles, RN PT Present: Barrie Folk, PT OT Present: Clyda Greener, OT SLP Present: Gunnar Fusi, SLP PPS Coordinator present : Daiva Nakayama, RN, CRRN     Current Status/Progress Goal Weekly Team Focus  Medical   Diabetic management, ongoing, started metformin. Blood pressure still uncontrolled, increasing amlodipine  Achieve goals for blood sugar and blood sugar pressure management   Medication adjustments   Bowel/Bladder        cont B & B     Swallow/Nutrition/ Hydration             ADL's   min assist UB bathing with supervision for dressing, min assist for LB bathing and dressing sit to stand approaching supervision.  transfers to the toilet and shower are min guard assist with use of his quad cane.  Increased flexor tone in the right elbow, and digit flexors.    Modified independent mostly with supervision  selfcare retraining, balance retraining, neuromuscular retraining, pt/family education, electrical stimulation   Mobility   Min assist overall for transfers and gait with LBQC. Supervision assist for bed mobility with use of bed features.   Mod I for gait and transfer with LRAD at househould level.   improved independence with Balance, transfers, gait, and bed mobility    Communication             Safety/Cognition/ Behavioral Observations            Pain        no issues     Skin      Monitoring skin        *See Care Plan and progress notes for long and short-term goals.  Barriers to Discharge: Still requiring heavy physical assistance, see above    Possible Resolutions to Barriers:  Continue neuromuscular reeducation, see above    Discharge Planning/Teaching Needs:  Home with intermittent assist form daughter's-they were providing home management prior to admission.       Team Discussion:  Goals supervision to mod/i level. Balance issues and safety  risk to fall at home. Berg 27/56. E-stim on arm for movement. MD may botox his arm for mobility. Pt is working hard in therapies and plans to reach his goals while her.MD adjusting medicines.  Revisions to Treatment Plan:  DC 2/17   Continued Need for Acute Rehabilitation Level of Care: The patient requires daily medical management by a physician with specialized training in physical medicine and rehabilitation for the following conditions: Daily direction of a multidisciplinary physical rehabilitation  program to ensure safe treatment while eliciting the highest outcome that is of practical value to the patient.: Yes Daily medical management of patient stability for increased activity during participation in an intensive rehabilitation regime.: Yes Daily analysis of laboratory values and/or radiology reports with any subsequent need for medication adjustment of medical intervention for : Neurological problems;Diabetes problems;Blood pressure problems  Shiree Altemus, Gardiner Rhyme 07/10/2016, 8:39 AM

## 2016-07-09 NOTE — Progress Notes (Signed)
Occupational Therapy Session Note  Patient Details  Name: [name] MRN: [id number] Date of Birth: 01/12/1951  Today's Date: 07/09/2016 OT Individual Time: 1106-1200 OT Individual Time Calculation (min): 54 min    Short Term Goals: Week 1:  OT Short Term Goal 1 (Week 1): Pt will complete stand pivot transfer to toilet with close supervision using LRAD OT Short Term Goal 2 (Week 1): Pt will dress LB with min A sit <> stand OT Short Term Goal 3 (Week 1): Pt will dress UB using hemi technique with 1 VC for technique OT Short Term Goal 4 (Week 1): Pt will complete 3/3 toileting tasks with steadying assist  Skilled Therapeutic Interventions/Progress Updates:    Pt completed bathing and dressing sit to stand during session.  He was able to ambulate to the walk-in shower with min guard assist after removing clothing sit to stand at the wheelchair.  Bathing with overall min assist for washing the left upper arm with the RUE.  He was able to wash the lower part of the arm with hemi technique and utilized the LH sponge for washing the left underarm.  He also utilized the [facility] sponge for washing his back and feet.  Min instructional cueing to make sure and rinse his feet before stranding to wash his peri area.  He also needed cueing to dry everything except for his peri area and then stand to dry it last, so he doesn't have to sit back down on the wet tub bench.  He completed dressing supine to sit EOB with min assist.  He transfers to supine to cross and maintain the RLE over the left knee when donning his pants and when donning his right shoe and AFO.  Pt needing min assist to transition to sitting from supine on his bed secondary to being too close to the edge.  He finished donning the left side of clothing and shoe as well as pulling pants over hips once sitting on the EOB.  Finished session with transfer to the wheelchair stand pivot with min guard assist.  Safety belt in place and call button in  reach.    Therapy Documentation Precautions:  Precautions Precautions: Fall Precaution Comments: Rt hemiparesis Restrictions Weight Bearing Restrictions: No  Pain: Pain Assessment Pain Assessment: No/denies pain ADL: See Function Navigator for Current Functional Status.   Therapy/Group: Individual Therapy  Lachina Salsberry OTR/L 07/09/2016, 12:23 PM

## 2016-07-09 NOTE — Progress Notes (Signed)
Speech Language Pathology Daily Session Note  Patient Details  Name: [name] MRN: [id number] Date of Birth: 1950-08-30  Today's Date: 07/09/2016 SLP Individual Time: 0930-1000 SLP Individual Time Calculation (min): 30 min  Short Term Goals: Week 1: SLP Short Term Goal 1 (Week 1): Patient will request help as needed for safety with completion of basic self-care tasks with Min assist question cues.  SLP Short Term Goal 2 (Week 1): Patient will self-monitor and correct errors during mildly complex problem solving tasks with Min assist question cues. SLP Short Term Goal 3 (Week 1): Patient will produce final sounds of words during sentence level verbal expression tasks with Mod assist mulimodal cues. SLP Short Term Goal 4 (Week 1): Patient will produce /l/ in all positions of words wtih Mod assist verbal and visual cues to elevate tongue.  SLP Short Term Goal 5 (Week 1): Patient will utilize lingual sweep to manage right sided buccal pocketing Mod I.   Skilled Therapeutic Interventions: Skilled treatment session focused on addressing speech goals. SLP facilitated session by providing Mod cues in unstructured tasks for use of speech intelligibility strategies; however, during structured sentence level of verbal expression tasks SLP able to fade cues to Min assist level to utilize over articulation.  SLP also facilitated session by addressing /l/ in all positions of words with Supervision level verbal cues to self-monitor and correct errors while reading at the word level.  Patient able to carryover production of /l/ words in self generated sentences with Min assist cues to self-monitor and correct errors.  Continue with current plan of care.    Function:  Cognition Comprehension Comprehension assist level: Follows basic conversation/direction with extra time/assistive device  Expression   Expression assist level: Expresses basic 50 - 74% of the time/requires cueing 25 - 49% of the time.  Needs to repeat parts of sentences.  Social Interaction Social Interaction assist level: Interacts appropriately 75 - 89% of the time - Needs redirection for appropriate language or to initiate interaction.  Problem Solving Problem solving assist level: Solves basic 75 - 89% of the time/requires cueing 10 - 24% of the time  Memory Memory assist level: Recognizes or recalls 75 - 89% of the time/requires cueing 10 - 24% of the time    Pain Pain Assessment Pain Assessment: No/denies pain  Therapy/Group: Individual Therapy  Carmelia Roller., [place] [id number]  [place] 07/09/2016, 5:03 PM

## 2016-07-09 NOTE — Progress Notes (Signed)
Occupational Therapy Session Note  Patient Details  Name: [name] MRN: [id number] Date of Birth: 04-Nov-1950  Today's Date: 07/09/2016 OT Individual Time: 1000-1026 OT Individual Time Calculation (min): 26 min    Short Term Goals: Week 1:  OT Short Term Goal 1 (Week 1): Pt will complete stand pivot transfer to toilet with close supervision using LRAD OT Short Term Goal 2 (Week 1): Pt will dress LB with min A sit <> stand OT Short Term Goal 3 (Week 1): Pt will dress UB using hemi technique with 1 VC for technique OT Short Term Goal 4 (Week 1): Pt will complete 3/3 toileting tasks with steadying assist  Skilled Therapeutic Interventions/Progress Updates:    Pt tolerated fifteen mins of NMES for the right digit flexors and extensors during session.  Utilized preset program of small muscle atrophy setting on Empi program with on time 10 seconds and off time 10 seconds.  Intensity at level 40 with PPS also set at 40 as well.  Integrated functional reaching with the right arm while stimulation was active.  Also instructed pt to help open his fingers as stimulation was active.  No adverse reactions noted with stimulation.  Pt maintained sitting in the wheelchair at end of session with call button and phone in reach.      Therapy Documentation Precautions:  Precautions Precautions: Fall Precaution Comments: Rt hemiparesis Restrictions Weight Bearing Restrictions: No  Pain: Pain Assessment Pain Assessment: No/denies pain Pain Score: 0-No pain ADL:  See Function Navigator for Current Functional Status.   Therapy/Group: Individual Therapy  Ilham Roughton OTR/L 07/09/2016, 12:12 PM

## 2016-07-10 ENCOUNTER — Inpatient Hospital Stay (HOSPITAL_COMMUNITY): Payer: Medicare Other | Admitting: Occupational Therapy

## 2016-07-10 ENCOUNTER — Inpatient Hospital Stay (HOSPITAL_COMMUNITY): Payer: Medicare Other | Admitting: Physical Therapy

## 2016-07-10 ENCOUNTER — Inpatient Hospital Stay (HOSPITAL_COMMUNITY): Payer: Medicare Other | Admitting: Speech Pathology

## 2016-07-10 LAB — GLUCOSE, CAPILLARY
GLUCOSE-CAPILLARY: 177 mg/dL — AB (ref 65–99)
Glucose-Capillary: 81 mg/dL (ref 65–99)
Glucose-Capillary: 92 mg/dL (ref 65–99)

## 2016-07-10 MED ORDER — METFORMIN HCL 500 MG PO TABS
1000.0000 mg | ORAL_TABLET | Freq: Every day | ORAL | Status: DC
  Administered 2016-07-11 – 2016-07-18 (×8): 1000 mg via ORAL
  Filled 2016-07-10 (×8): qty 2

## 2016-07-10 MED ORDER — INSULIN DETEMIR 100 UNIT/ML ~~LOC~~ SOLN
27.0000 [IU] | Freq: Every day | SUBCUTANEOUS | Status: DC
  Administered 2016-07-11 – 2016-07-18 (×8): 27 [IU] via SUBCUTANEOUS
  Filled 2016-07-10 (×8): qty 0.27

## 2016-07-10 NOTE — Procedures (Addendum)
Dysport injection Lot number [id number]  Expiration 08/29/2016  Dysport 500 units in 2.5 mL, preservative-free normal saline Indication spasticity right upper extremity, not relieved with PT, OT and other conservative care Described risks and benefits of the procedure with the patient. He elects to proceed Needle 25-gauge 1.5 inch Muscles and dosing Right Biceps 400 units FCR, 200 units. FDS 200 units. Pronator teres 100 units. FPL, 100 units  Patient tolerated procedure well. Postprocedure orders written

## 2016-07-10 NOTE — Plan of Care (Signed)
Problem: RH PAIN MANAGEMENT Goal: RH STG PAIN MANAGED AT OR BELOW PT'S PAIN GOAL Less than 3.On 1 to 10 scale.  Outcome: Progressing No c/o pain

## 2016-07-10 NOTE — Progress Notes (Signed)
Subjective/Complaints: Patient's blood sugar down to 81 this morning. However, he ate a very good breakfast.  Review systems negative chest pain. Negative shortness of breath. Negative nausea, vomiting, diarrhea, constipation  Objective: Vital Signs: Blood pressure (!) 163/58, pulse (!) 56, temperature 98.2 F (36.8 C), temperature source Oral, resp. rate 17, height 5\' 7"  (1.702 m), weight 109 kg (240 lb 6.4 oz), SpO2 97 %. No results found. Results for orders placed or performed during the hospital encounter of 07/04/16 (from the past 72 hour(s))  Glucose, capillary     Status: Abnormal   Collection Time: 07/07/16 11:58 AM  Result Value Ref Range   Glucose-Capillary 120 (H) 65 - 99 mg/dL  Glucose, capillary     Status: Abnormal   Collection Time: 07/07/16  4:38 PM  Result Value Ref Range   Glucose-Capillary 153 (H) 65 - 99 mg/dL  Glucose, capillary     Status: Abnormal   Collection Time: 07/07/16  9:09 PM  Result Value Ref Range   Glucose-Capillary 257 (H) 65 - 99 mg/dL   Comment 1 Notify RN   Glucose, capillary     Status: None   Collection Time: 07/08/16  6:41 AM  Result Value Ref Range   Glucose-Capillary 83 65 - 99 mg/dL   Comment 1 Notify RN   Glucose, capillary     Status: None   Collection Time: 07/08/16 12:01 PM  Result Value Ref Range   Glucose-Capillary 97 65 - 99 mg/dL  Glucose, capillary     Status: Abnormal   Collection Time: 07/08/16  4:45 PM  Result Value Ref Range   Glucose-Capillary 137 (H) 65 - 99 mg/dL  Glucose, capillary     Status: Abnormal   Collection Time: 07/08/16  8:57 PM  Result Value Ref Range   Glucose-Capillary 192 (H) 65 - 99 mg/dL   Comment 1 Notify RN   Glucose, capillary     Status: Abnormal   Collection Time: 07/09/16  6:19 AM  Result Value Ref Range   Glucose-Capillary 103 (H) 65 - 99 mg/dL  Glucose, capillary     Status: Abnormal   Collection Time: 07/09/16 11:40 AM  Result Value Ref Range   Glucose-Capillary 105 (H) 65 - 99  mg/dL  Glucose, capillary     Status: Abnormal   Collection Time: 07/09/16  8:22 PM  Result Value Ref Range   Glucose-Capillary 177 (H) 65 - 99 mg/dL  Glucose, capillary     Status: None   Collection Time: 07/10/16  6:29 AM  Result Value Ref Range   Glucose-Capillary 81 65 - 99 mg/dL   Comment 1 Notify RN      General: No acute distress Mood and affect are appropriate Heart: Regular rate and rhythm no rubs murmurs or extra sounds Lungs: Clear to auscultation, breathing unlabored, no rales or wheezes Abdomen: Positive bowel sounds, soft nontender to palpation, nondistended Extremities: No clubbing, cyanosis, or edema Skin: No evidence of breakdown, no evidence of rash Neurologic: Cranial nerves II through XII intact, motor strength is 5/5 in left deltoid, bicep, tricep, grip, hip flexor, knee extensors, ankle dorsiflexor and plantar flexor 2 minus in the right deltoid, bicep, tricep, trace finger flexors, trace hip flexor 2 minus, knee extensor, trace ankle dorsiflexor [place] 3, finger flexors, wrist flexor and elbow flexor on the right side. Cerebellar exam normal finger to nose to finger as well as heel to shin in left upper and lower extremities, unable to perform due to motor weakness on the  right side Musculoskeletal: Full range of motion in all 4 extremities. No joint swelling   Assessment/Plan: 1. Functional deficits secondary to left ACA infarct with right hemiparesis which require 3+ hours per day of interdisciplinary therapy in a comprehensive inpatient rehab setting. Physiatrist is providing close team supervision and 24 hour management of active medical problems listed below. Physiatrist and rehab team continue to assess barriers to discharge/monitor patient progress toward functional and medical goals. FIM: Function - Bathing Position: Shower Body parts bathed by patient: Chest, Abdomen, Front perineal area, Buttocks, Right upper leg, Left upper leg, Right lower leg,  Left lower leg, Back, Right arm Body parts bathed by helper: Left arm Assist Level: Touching or steadying assistance(Pt > 75%)  Function- Upper Body Dressing/Undressing What is the patient wearing?: Pull over shirt/dress Pull over shirt/dress - Perfomed by patient: Thread/unthread right sleeve, Thread/unthread left sleeve, Put head through opening, Pull shirt over trunk Pull over shirt/dress - Perfomed by helper: Thread/unthread right sleeve, Thread/unthread left sleeve, Put head through opening, Pull shirt over trunk Assist Level: Supervision or verbal cues Function - Lower Body Dressing/Undressing What is the patient wearing?: Shoes, AFO, Pants, Ted Hose Position: Bed (supine to sit) Pants- Performed by patient: Thread/unthread right pants leg, Thread/unthread left pants leg, Pull pants up/down Pants- Performed by helper: Thread/unthread right pants leg, Thread/unthread left pants leg, Pull pants up/down Non-skid slipper socks- Performed by patient: Don/doff left sock, Don/doff right sock Non-skid slipper socks- Performed by helper: Don/doff right sock Shoes - Performed by patient: Don/doff right shoe, Don/doff left shoe AFO - Performed by patient: Don/doff right AFO TED Hose - Performed by helper: Don/doff right TED hose, Don/doff left TED hose Assist for footwear: Supervision/touching assist Assist for lower body dressing: Touching or steadying assistance (Pt > 75%)  Function - Toileting Toileting activity did not occur: No continent bowel/bladder event (urinal) Toileting steps completed by patient: Adjust clothing prior to toileting Toileting steps completed by helper: Adjust clothing prior to toileting, Performs perineal hygiene, Adjust clothing after toileting Toileting Assistive Devices: Grab bar or rail Assist level: Touching or steadying assistance (Pt.75%)  Function - Air cabin crew transfer activity did not occur: Refused Toilet transfer assistive device: Grab  bar Assist level to toilet: Moderate assist (Pt 50 - 74%/lift or lower) Assist level from toilet: Moderate assist (Pt 50 - 74%/lift or lower)  Function - Chair/bed transfer Chair/bed transfer method: Stand pivot Chair/bed transfer assist level: Supervision or verbal cues Chair/bed transfer assistive device: Armrests, Cane Chair/bed transfer details: Verbal cues for technique, Verbal cues for safe use of DME/AE, Manual facilitation for weight shifting, Verbal cues for sequencing  Function - Locomotion: Wheelchair Will patient use wheelchair at discharge?: No Type: Manual Max wheelchair distance: 150ft  Assist Level: Supervision or verbal cues Wheel 50 feet with 2 turns activity did not occur: Safety/medical concerns Assist Level: Supervision or verbal cues Wheel 150 feet activity did not occur: Safety/medical concerns Assist Level: Supervision or verbal cues Turns around,maneuvers to table,bed, and toilet,negotiates 3% grade,maneuvers on rugs and over doorsills: No Function - Locomotion: Ambulation Assistive device: Cane-quad Max distance: 175 Assist level: Touching or steadying assistance (Pt > 75%) Assist level: Supervision or verbal cues Walk 50 feet with 2 turns activity did not occur: Safety/medical concerns Assist level: Touching or steadying assistance (Pt > 75%) Walk 150 feet activity did not occur: Safety/medical concerns Walk 10 feet on uneven surfaces activity did not occur: Safety/medical concerns Assist level: Touching or steadying assistance (Pt > 75%)  Function - Comprehension Comprehension: Auditory Comprehension assist level: Follows basic conversation/direction with extra time/assistive device  Function - Expression Expression: Verbal Expression assist level: Expresses basic 50 - 74% of the time/requires cueing 25 - 49% of the time. Needs to repeat parts of sentences.  Function - Social Interaction Social Interaction assist level: Interacts appropriately 75 -  89% of the time - Needs redirection for appropriate language or to initiate interaction.  Function - Problem Solving Problem solving assist level: Solves basic 75 - 89% of the time/requires cueing 10 - 24% of the time  Function - Memory Memory assist level: Recognizes or recalls 75 - 89% of the time/requires cueing 10 - 24% of the time Patient normally able to recall (first 3 days only): Staff names and faces, Current season, That he or she is in a hospital, Location of own room  Medical Problem List and Plan: 1. 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 DVT Prophylaxis/Anticoagulation: Subcutaneous Lovenox. Monitor platelet counts and any signs of bleeding 3. Pain Management: Tylenol as needed -consider baclofen trial for spasticity, have discussed Botox as well 4. Mood: Zoloft 100 mg daily 5. Neuropsych: This patient iscapable of making decisions on hisown behalf. 6. Skin/Wound Care: Routine skin checks 7. Fluids/Electrolytes/Nutrition: Routine I&O with follow-up chemistries 8.Diabetes mellitus of peripheral neuropathy. Hemoglobin A1c 6.9.Levemir 30 units daily. Check blood sugars before meals and at bedtime. Diabetic teaching, good a.m. CBGs, added metformin 500 mg 2/5 with breakfast to help with daytime CBGs  No apparent improvement will increase to 1000mg , reduced Levemir. 27 units CBG (last 3)   Recent Labs  07/09/16 1140 07/09/16 2022 07/10/16 0629  GLUCAP 105* 177* 81       9.CRI stage II. Follow-up chemistries upon admit. 10.Hypertension. Permissive hypertension. Patient on Coreg 25 mg twice a day, HCTZ 25 mg daily, lisinopril 40 mg daily, Aldactone 50 mg daily, Norvasc 10 mg daily, Revatio 60 mg daily prior to  admission. Resumed amlodipine 10mg , continue current dose Vitals:   07/09/16 1346 07/10/16 0537  BP: (!) 165/58 (!) 163/58  Pulse: 63 (!) 56  Resp: 18 17  Temp: 98 F (36.7 C) 98.2 F (36.8 C)   11.Hyperlipidemia. Lipitor 12.Question medical compliance. Provide education 13. Leukocytosis, improving 15.7 on 2/2, 12.5 on 2/5, Monitor temperature 14. Hypoalbuminemia. Moderate, pro-stat LOS (Days) 6 A FACE TO FACE EVALUATION WAS PERFORMED  KIRSTEINS,ANDREW E 07/10/2016, 10:32 AM

## 2016-07-10 NOTE — Progress Notes (Signed)
Speech Language Pathology Daily Session Note  Patient Details  Name: [name] MRN: [id number] Date of Birth: [birth year]  Today's Date: 07/10/2016 SLP Individual Time: [id number] SLP Individual Time Calculation (min): 45 min  Short Term Goals: Week 1: SLP Short Term Goal 1 (Week 1): Patient will request help as needed for safety with completion of basic self-care tasks with Min assist question cues.  SLP Short Term Goal 2 (Week 1): Patient will self-monitor and correct errors during mildly complex problem solving tasks with Min assist question cues. SLP Short Term Goal 3 (Week 1): Patient will produce final sounds of words during sentence level verbal expression tasks with Mod assist mulimodal cues. SLP Short Term Goal 4 (Week 1): Patient will produce /l/ in all positions of words wtih Mod assist verbal and visual cues to elevate tongue.  SLP Short Term Goal 5 (Week 1): Patient will utilize lingual sweep to manage right sided buccal pocketing Mod I.   Skilled Therapeutic Interventions: Skilled treatment session focused on speech communication goals. SLP facilitated session by providing Mod A verbal cues for 75% intelligibility at the word level targeting words with /l/. Pt attempted to self-correct but attempts didn't yet any increase in intelligibility. Pt was returned to room, left upright in wheelchair with all needs within reach.       Function:    Cognition Comprehension Comprehension assist level: Follows basic conversation/direction with extra time/assistive device  Expression   Expression assist level: Expresses basic 50 - 74% of the time/requires cueing 25 - 49% of the time. Needs to repeat parts of sentences.  Social Interaction Social Interaction assist level: Interacts appropriately 75 - 89% of the time - Needs redirection for appropriate language or to initiate interaction.  Problem Solving Problem solving assist level: Solves basic 75 - 89% of the time/requires cueing  10 - 24% of the time  Memory Memory assist level: Recognizes or recalls 75 - 89% of the time/requires cueing 10 - 24% of the time    Pain Pain Assessment Pain Assessment: No/denies pain  Therapy/Group: Individual Therapy  Nazifa Trinka B. Rutherford Nail, M.S., CCC-SLP Speech-Language Pathologist  [name] 07/10/2016, 2:24 PM

## 2016-07-10 NOTE — Plan of Care (Signed)
Problem: RH Tub/Shower Transfers Goal: LTG Patient will perform tub/shower transfers w/assist (OT) LTG: Patient will perform tub/shower transfers with assist, with/without cues using equipment (OT)  Goal downgraded based on progress and expectations.

## 2016-07-10 NOTE — Progress Notes (Signed)
Social Work Elease Hashimoto, LCSW Social Worker Signed   Patient Care Conference Date of Service: 07/09/2016  1:35 PM      Hide copied text Hover for attribution information Inpatient RehabilitationTeam Conference and Plan of Care Update Date: 07/09/2016   Time: 10:48 AM      Patient Name: [name]      Medical Record Number: [id number]  Date of Birth: September 26, 1950 Sex: Male         Room/Bed: 4W10C/4W10C-01 Payor Info: Payor: MEDICARE / Plan: MEDICARE PART A AND B / Product Type: *No Product type* /     Admitting Diagnosis: l CVA  Admit Date/Time:  07/04/2016  4:55 PM Admission Comments: No comment available    Primary Diagnosis:  Cerebrovascular small vessel disease Principal Problem: Cerebrovascular small vessel disease       Patient Active Problem List    Diagnosis Date Noted  . Cerebrovascular small vessel disease 07/04/2016  . Aphasia due to recent cerebral infarction 07/04/2016  . Cerebrovascular accident (CVA) ([place])    . Altered mental status 06/30/2016  . Fall 06/30/2016  . Acute encephalopathy 06/30/2016  . Sepsis ([place]) 06/30/2016  . Diarrhea 06/30/2016  . Acute renal failure superimposed on stage 2 chronic kidney disease ([place]) 06/30/2016  . Hemiparesis affecting right side as late effect of stroke ([place]) 10/23/2015  . PAD (peripheral artery disease) ([place]) 10/20/2011  . Hyperlipidemia 02/21/2010  . CEREBROVASCULAR ACCIDENT, HX OF 12/22/2008  . Diabetes mellitus, type II ([place]) 07/30/2006  . OBESITY, NOS 07/30/2006  . Essential hypertension, benign 07/30/2006      Expected Discharge Date: Expected Discharge Date: 07/19/16   Team Members Present: Physician leading conference: Dr. Alysia Penna Social Worker Present: Ovidio Kin, LCSW Nurse Present: Dorthula Nettles, RN PT Present: Barrie Folk, PT OT Present: Clyda Greener, OT SLP Present: Gunnar Fusi, SLP PPS Coordinator present : Daiva Nakayama, RN, CRRN       Current Status/Progress Goal Weekly Team  Focus  Medical     Diabetic management, ongoing, started metformin. Blood pressure still uncontrolled, increasing amlodipine  Achieve goals for blood sugar and blood sugar pressure management  Medication adjustments   Bowel/Bladder          cont B & B     Swallow/Nutrition/ Hydration               ADL's     min assist UB bathing with supervision for dressing, min assist for LB bathing and dressing sit to stand approaching supervision.  transfers to the toilet and shower are min guard assist with use of his quad cane.  Increased flexor tone in the right elbow, and digit flexors.    Modified independent mostly with supervision  selfcare retraining, balance retraining, neuromuscular retraining, pt/family education, electrical stimulation   Mobility     Min assist overall for transfers and gait with LBQC. Supervision assist for bed mobility with use of bed features.   Mod I for gait and transfer with LRAD at househould level.   improved independence with Balance, transfers, gait, and bed mobility    Communication               Safety/Cognition/ Behavioral Observations             Pain          no issues     Skin        Monitoring skin       *See Care Plan and progress notes for long  and short-term goals.   Barriers to Discharge: Still requiring heavy physical assistance, see above     Possible Resolutions to Barriers:  Continue neuromuscular reeducation, see above     Discharge Planning/Teaching Needs:  Home with intermittent assist form daughter's-they were providing home management prior to admission.       Team Discussion:  Goals supervision to mod/i level. Balance issues and safety risk to fall at home. Berg 27/56. E-stim on arm for movement. MD may botox his arm for mobility. Pt is working hard in therapies and plans to reach his goals while her.MD adjusting medicines.  Revisions to Treatment Plan:  DC 2/17    Continued Need for Acute Rehabilitation Level of Care: The  patient requires daily medical management by a physician with specialized training in physical medicine and rehabilitation for the following conditions: Daily direction of a multidisciplinary physical rehabilitation program to ensure safe treatment while eliciting the highest outcome that is of practical value to the patient.: Yes Daily medical management of patient stability for increased activity during participation in an intensive rehabilitation regime.: Yes Daily analysis of laboratory values and/or radiology reports with any subsequent need for medication adjustment of medical intervention for : Neurological problems;Diabetes problems;Blood pressure problems   Elease Hashimoto 07/10/2016, 8:39 AM       Patient ID: Conni Elliot, male   DOB: 01/30/1951, 66 y.o.   MRN: [id number]

## 2016-07-10 NOTE — Progress Notes (Signed)
Occupational Therapy Weekly Progress Note  Patient Details  Name: [name] MRN: [id number] Date of Birth: 1951-02-22  Beginning of progress report period: July 05, 2016 End of progress report period: July 10, 2016  Today's Date: 07/10/2016 OT Individual Time: 6720-9470 OT Individual Time Calculation (min): 29 min    Patient has met 3 of 4 short term goals.  Mr. Saephan continues to demonstrate RUE and RLE hemiparesis with increased tone.  Currently, he can use the LUE as a stabilizer with supervision but needs mod assist to integrate into bathing tasks to wash the LUE.  Min guard assist for sit to stand and for functional transfers using the quad cane for support.  He needs min assist for bathing at this time with use of a LH sponge for washing the LEs and back.  Min assist is also still needed for dressing tasks as he demonstrates LOB to the right when attempting to donn his AFO when sitting unsupported.  He has adapted well to hemi bathing and dressing techniques since his prior CVA but still needs assistance for dynamic standing balance as it relates to selfcare performance.  Feel pt can progress to modified independent level for toileting tasks but may need supervision for showering and dressing tasks.  Goals to be adjusted to account for this.   Patient continues to demonstrate the following deficits: muscle weakness, impaired timing and sequencing, abnormal tone, unbalanced muscle activation and decreased coordination and decreased standing balance, decreased postural control, hemiplegia and decreased balance strategies and therefore will continue to benefit from skilled OT intervention to enhance overall performance with BADL and Reduce care partner burden.  Patient progressing toward long term goals..  Plan of care revisions: Some goals downgraded.  OT Short Term Goals Week 2:  OT Short Term Goal 1 (Week 2): Pt will continue working toward established LTGs set at overall  modified independent to supervision level.    Skilled Therapeutic Interventions/Progress Updates:    Pt transitioned to the therapy gym where focus of session was on neuromuscular re-education for the LUE.  Pt place in supine for stretching to internal rotators of the shoulder as well pectoral and shoulder extensor muscles.  Increased tightness in all internal rotators of the shoulder, forearm, and elbow flexors.  Transitioned to sitting with RUE in weightbearing with working on movements of elbow extension to assist with sidelying to sit transitions.  Pt needing mod facilitation to transition to sitting with activation of the left trunk and to avoid using his head and left trunk to return to sitting.  Finished session with return to the room via wheelchair.  Pt left in wheelchair with call button and phone in place.    Therapy Documentation Precautions:  Precautions Precautions: Fall Precaution Comments: Rt hemiparesis Restrictions Weight Bearing Restrictions: No  Pain: Pain Assessment Pain Assessment: No/denies pain ADL: See Function Navigator for Current Functional Status.   Therapy/Group: Individual Therapy  Iva Posten OTR/L 07/10/2016, 3:40 PM

## 2016-07-10 NOTE — Progress Notes (Signed)
Physical Therapy Session Note  Patient Details  Name: [name] MRN: [id number] Date of Birth: 20-Nov-1950  Today's Date: 07/10/2016 PT Individual Time: 0905-1000 PT Individual Time Calculation (min): 55 min   Short Term Goals: Week 1:  PT Short Term Goal 1 (Week 1): Pt will increase rolling with no rails to c/s. PT Short Term Goal 2 (Week 1): Pt will increase transfers in bed to S.  PT Short Term Goal 3 (Week 1): Pt will increase transfers to min A.  PT Short Term Goal 4 (Week 1): Pt will increase gait with WBQC to min A about 50 feet. PT Short Term Goal 5 (Week 1): Pt will ascend/descend 4 stairs with 2 rails and mod A.   Skilled Therapeutic Interventions/Progress Updates:     Pt received sitting in WC and agreeable to PT  PT assisted patient to [place] for time management with max assist.   Upper body dressing while sitting in WC with min assist to problem solve RUE management.   Gait training to rehab gym instructed by PT x175 with [facility] and supervision assist progressing to [place] assist for last 159f due to decreased postural control and poor foot clearance on the R.   [place] standing balance to tap 1 foot on 1 of 3 targets in random order x 20 with BLE and RUE support. Min assist throughout with min tactile cues for improved weight shifting over the RLE.   Gait in rehab gym x 134fand 2012fith close supervision assist and [facility] cues for improved step height on the RLE with increased hip flexion and decreased circumduction.    Nustep reciprocal movement neuromuscular re-education x 8 minutes with min cues to maintain SPM >30 ans increase ROM on the RLE.   PT instructed patient in stair negotiation x 4 steps with LUE support and min assist for ascent and mod assist for descent. Min cues for posture, step to gait pattern and improved weight shifting to improve safety with descent.    WC mobility in hall [id number] L hemi technique and supervision assist from PT. Min  cues for obstacle negotiation in tight spaces of room   Patient returned to room and left [facility] in reach, QRB donned, and all needs met.       Therapy Documentation Precautions:  Precautions Precautions: Fall Precaution Comments: Rt hemiparesis Restrictions Weight Bearing Restrictions: No   Pain: 01/0    See Function Navigator for Current Functional Status.   Therapy/Group: Individual Therapy  AustLorie Phenix/2018, 10:02 AM

## 2016-07-10 NOTE — Progress Notes (Signed)
Occupational Therapy Session Note  Patient Details  Name: [name] MRN: [id number] Date of Birth: 07/11/1950  Today's Date: 07/10/2016 OT Individual Time: [id number] OT Individual Time Calculation (min): 55 min    Short Term Goals: Week 1:  OT Short Term Goal 1 (Week 1): Pt will complete stand pivot transfer to toilet with close supervision using LRAD OT Short Term Goal 2 (Week 1): Pt will dress LB with min A sit <> stand OT Short Term Goal 3 (Week 1): Pt will dress UB using hemi technique with 1 VC for technique OT Short Term Goal 4 (Week 1): Pt will complete 3/3 toileting tasks with steadying assist  Skilled Therapeutic Interventions/Progress Updates:    Pt completed bathing and dressing sit to stand during session.  He was able to ambulate to the shower with his quad cane and min guard assist.  He completed shower with min assist, utilizing a LH sponge for washing his LEs, back, and part of the LUE.  He still required assistance for washing the LUE at the shoulder however.  Dressing completed sit to supine on the bed.  Pt needing to transition to supine for threading the pants leg over the left foot as well as for donning the right shoe and AFO.  Pt transitioned back to sitting with min assist to work on standing to pull his pants over his hips.  When attempting to latch the upright section of the AFO he demonstrated LOB to the right sitting EOB X 2.  Recommended transfer to wheelchair before finishing this as to provide lateral support.  He completed transfer with min guard assist and finished latching AFO with supervision.  Finished session with practice on tub/shower transfers with min assist to tub bench in the ADL apartment.  Pt needing min assist for lifting the RLE over the edge of the tub and mod instructional cueing for technique when transferring out secondary to getting too close to the front edge of the tub bench.  Pt returned to room via wheelchair at end of session.  Pt left  with safety belt in place and call button in reach.      Therapy Documentation Precautions:  Precautions Precautions: Fall Precaution Comments: Rt hemiparesis Restrictions Weight Bearing Restrictions: No  Pain: Pain Assessment Pain Assessment: No/denies pain ADL: See Function Navigator for Current Functional Status.   Therapy/Group: Individual Therapy  Dilcia Rybarczyk OTR/L 07/10/2016, 12:53 PM

## 2016-07-11 ENCOUNTER — Inpatient Hospital Stay (HOSPITAL_COMMUNITY): Payer: Medicare Other | Admitting: Occupational Therapy

## 2016-07-11 ENCOUNTER — Inpatient Hospital Stay (HOSPITAL_COMMUNITY): Payer: Medicare Other | Admitting: Physical Therapy

## 2016-07-11 ENCOUNTER — Inpatient Hospital Stay (HOSPITAL_COMMUNITY): Payer: Medicare Other | Admitting: Speech Pathology

## 2016-07-11 LAB — GLUCOSE, CAPILLARY
GLUCOSE-CAPILLARY: 131 mg/dL — AB (ref 65–99)
Glucose-Capillary: 190 mg/dL — ABNORMAL HIGH (ref 65–99)
Glucose-Capillary: 108 mg/dL — ABNORMAL HIGH (ref 65–99)

## 2016-07-11 LAB — CREATININE, SERUM
Creatinine, Ser: 1.12 mg/dL (ref 0.61–1.24)
GFR calc non Af Amer: >60 mL/min (ref >60)

## 2016-07-11 NOTE — Progress Notes (Signed)
Physical Therapy Session Note  Patient Details  Name: [name] MRN: [id number] Date of Birth: [birth year]  Today's Date: 07/11/2016 PT Individual Time: 1400-1500 PT Individual Time Calculation (min): 60 min   Short Term Goals: Week 1:  PT Short Term Goal 1 (Week 1): Pt will increase rolling with no rails to c/s. PT Short Term Goal 2 (Week 1): Pt will increase transfers in bed to S.  PT Short Term Goal 3 (Week 1): Pt will increase transfers to min A.  PT Short Term Goal 4 (Week 1): Pt will increase gait with WBQC to min A about 50 feet. PT Short Term Goal 5 (Week 1): Pt will ascend/descend 4 stairs with 2 rails and mod A.  Week 2:     Skilled Therapeutic Interventions/Progress Updates:     Dynamic gait training instructed by PT.  Weave through 5 cones at 74f intervals x2, min assist from PT with cues for proper gait pattern when turning to the L to prevent LOB.  Stepping over over 1 inch obstacle 2x 4 with LBQC min assist from PT with mod cues for proper step-to gait pattern and AD management  Gait through simulated community environment of gift shop. With intermittent min-supervision assist with decreased ability to clear R foot for last 435f2/2 fatigue.  Gait through hall in hospital x 12583fith close supervision-min assist from PT due to intermittent poor foot clearance.  WC mobility through controlled environment of hospital hall way in simulated community environment over side walk outside of hospital. 150 ft in each setting with supervision assist from with cues to control speed with UE and improved posture to improve use of Hemi technique .  Patient returned too room and [name] reach and all needs met.     Therapy Documentation Precautions:  Precautions Precautions: Fall Precaution Comments: Rt hemiparesis Restrictions Weight Bearing Restrictions: No Pain: Pain Assessment Pain Assessment: No/denies pain   See Function Navigator for  Current Functional Status.   Therapy/Group: Individual Therapy  AusLorie Phenix9/2018, 3:02 PM

## 2016-07-11 NOTE — Progress Notes (Signed)
Speech Language Pathology Weekly Progress and Session Note  Patient Details  Name: [name] MRN: [id number] Date of Birth: May 23, 1951  Beginning of progress report period: July 05, 2016 End of progress report period: July 11, 2016  Today's Date: 07/11/2016 SLP Individual Time: 1000-1030 SLP Individual Time Calculation (min): 30 min  Short Term Goals: Week 1: SLP Short Term Goal 1 (Week 1): Patient will request help as needed for safety with completion of basic self-care tasks with Min assist question cues.  SLP Short Term Goal 1 - Progress (Week 1): Met SLP Short Term Goal 2 (Week 1): Patient will self-monitor and correct errors during mildly complex problem solving tasks with Min assist question cues. SLP Short Term Goal 2 - Progress (Week 1): Met SLP Short Term Goal 3 (Week 1): Patient will produce final sounds of words during sentence level verbal expression tasks with Mod assist mulimodal cues. SLP Short Term Goal 3 - Progress (Week 1): Not met SLP Short Term Goal 4 (Week 1): Patient will produce /l/ in all positions of words wtih Mod assist verbal and visual cues to elevate tongue.  SLP Short Term Goal 4 - Progress (Week 1): Met SLP Short Term Goal 5 (Week 1): Patient will utilize lingual sweep to manage right sided buccal pocketing Mod I.  SLP Short Term Goal 5 - Progress (Week 1): Not met    New Short Term Goals: Week 2: SLP Short Term Goal 1 (Week 2): Patient will request help as needed for safety with completion of basic self-care tasks with supervision question cues.  SLP Short Term Goal 2 (Week 2): Patient will self-monitor and correct errors during mildly complex problem solving tasks with supervision question cues. SLP Short Term Goal 3 (Week 2): Patient will produce final sounds of words during sentence level verbal expression tasks with Mod assist mulimodal cues. SLP Short Term Goal 4 (Week 2): Patient will produce /l/ in all positions of words with min assist  verbal and visual cues to elevate tongue.  SLP Short Term Goal 5 (Week 2): Patient will utilize lingual sweep to manage right sided buccal pocketing Mod I.   Weekly Progress Updates: Pt has made functional progress this reporting period as evidenced by meeting 4 of 6 STG's. Pt with progress towards solving semi-complex problems with Min A verbal cues and with producing /l/ in all position at the word level with Mod A verbal cues. Pt continues to exhibit deficits in the areas of speech intelligibility, dysphagia and complex problem solving. Pt continues to require skilled ST to address these deficits and increase functional independence prior to discharge.      Intensity: Minumum of 1-2 x/day, 30 to 90 minutes Frequency: 3 to 5 out of 7 days Duration/Length of Stay: 2/17 Treatment/Interventions: Cognitive remediation/compensation;Cueing hierarchy;Dysphagia/aspiration precaution training;Functional tasks;Internal/external aids;Medication managment;Patient/family education;Speech/Language facilitation;Therapeutic Exercise;Therapeutic Activities   Daily Session  Skilled Therapeutic Interventions: Skilled treatment session focused on cognition goals. SLP facilitated the session by providing Min A faded to supervision cues for completion of semi-complex problem solving task. Pt was returned to room, left upright in wheelchair with safety belt donned and all needs within reach. Continue per current plan of care.     Function:    Cognition Comprehension Comprehension assist level: Follows basic conversation/direction with extra time/assistive device  Expression   Expression assist level: Expresses basic 50 - 74% of the time/requires cueing 25 - 49% of the time. Needs to repeat parts of sentences.  Social Interaction Social Interaction assist  level: Interacts appropriately 75 - 89% of the time - Needs redirection for appropriate language or to initiate interaction.  Problem Solving Problem solving  assist level: Solves basic 75 - 89% of the time/requires cueing 10 - 24% of the time  Memory Memory assist level: Recognizes or recalls 75 - 89% of the time/requires cueing 10 - 24% of the time   General    Pain Pain Assessment Pain Assessment: No/denies pain Pain Score: 0-No pain  Therapy/Group: Individual Therapy  Jena Tegeler B. Rutherford Nail, M.S., CCC-SLP Speech-Language Pathologist  [name] 07/11/2016, 12:43 PM

## 2016-07-11 NOTE — Progress Notes (Signed)
Occupational Therapy Session Note  Patient Details  Name: [name] MRN: [id number] Date of Birth: 1950/08/03  Today's Date: 07/11/2016 OT Individual Time: [id number] OT Individual Time Calculation (min): 56 min    Short Term Goals: Week 2:  OT Short Term Goal 1 (Week 2): Pt will continue working toward established LTGs set at overall modified independent to supervision level.    Skilled Therapeutic Interventions/Progress Updates:    Pt completed transfer from supine to sit EOB without using rail and min assist.  Min assist for first initial standing and steps to the shower seat with use of the quad cane.  He was able to bathe sit to stand with min guard assist, integrating the LH sponge for washing the LUE, back, and his feet bilaterally.  He needed min instructional cueing to remember to turn the water off at end of session but did remember to rinse off all of his body and feet before standing to wash and rinse his peri area.  Dressing sit to stand on the EOB.  Utilized the reacher to assist with donning clothing over the LLE secondary to not laying back in bed.  He did return to the bed to complete donning the left AFO and shoe but finished strapping it in the wheelchair.  Pt left in wheelchair with call button and phone in reach and safety belt in place.    Therapy Documentation Precautions:  Precautions Precautions: Fall Precaution Comments: Rt hemiparesis Restrictions Weight Bearing Restrictions: No  Pain: Pain Assessment Pain Assessment: No/denies pain ADL: See Function Navigator for Current Functional Status.   Therapy/Group: Individual Therapy  Shermaine Rivet OTR/L 07/11/2016, 9:00 AM

## 2016-07-11 NOTE — Progress Notes (Signed)
Subjective/Complaints: No sig bruising from botulinum toxin injections yesterday, but no arm pain, discussed location of injections with occupational therapy  Review systems negative chest pain. Negative shortness of breath. Negative nausea, vomiting, diarrhea, constipation  Objective: Vital Signs: Blood pressure (!) 148/66, pulse (!) 58, temperature 98.1 F (36.7 C), temperature source Oral, resp. rate 17, height 5' 7"  (1.702 m), weight 109 kg (240 lb 6.4 oz), SpO2 98 %. No results found. Results for orders placed or performed during the hospital encounter of 07/04/16 (from the past 72 hour(s))  Glucose, capillary     Status: None   Collection Time: 07/08/16 12:01 PM  Result Value Ref Range   Glucose-Capillary 97 65 - 99 mg/dL  Glucose, capillary     Status: Abnormal   Collection Time: 07/08/16  4:45 PM  Result Value Ref Range   Glucose-Capillary 137 (H) 65 - 99 mg/dL  Glucose, capillary     Status: Abnormal   Collection Time: 07/08/16  8:57 PM  Result Value Ref Range   Glucose-Capillary 192 (H) 65 - 99 mg/dL   Comment 1 Notify RN   Glucose, capillary     Status: Abnormal   Collection Time: 07/09/16  6:19 AM  Result Value Ref Range   Glucose-Capillary 103 (H) 65 - 99 mg/dL  Glucose, capillary     Status: Abnormal   Collection Time: 07/09/16 11:40 AM  Result Value Ref Range   Glucose-Capillary 105 (H) 65 - 99 mg/dL  Glucose, capillary     Status: Abnormal   Collection Time: 07/09/16  8:22 PM  Result Value Ref Range   Glucose-Capillary 177 (H) 65 - 99 mg/dL  Glucose, capillary     Status: None   Collection Time: 07/10/16  6:29 AM  Result Value Ref Range   Glucose-Capillary 81 65 - 99 mg/dL   Comment 1 Notify RN   Glucose, capillary     Status: None   Collection Time: 07/10/16 12:06 PM  Result Value Ref Range   Glucose-Capillary 92 65 - 99 mg/dL  Creatinine, serum     Status: None   Collection Time: 07/11/16  4:10 AM  Result Value Ref Range   Creatinine, Ser 1.12  0.61 - 1.24 mg/dL   GFR calc non Af Amer >60 >60 mL/min   GFR calc Af Amer >60 >60 mL/min    Comment: (NOTE) The eGFR has been calculated using the CKD EPI equation. This calculation has not been validated in all clinical situations. eGFR's persistently <60 mL/min signify possible Chronic Kidney Disease.      General: No acute distress Mood and affect are appropriate Heart: Regular rate and rhythm no rubs murmurs or extra sounds Lungs: Clear to auscultation, breathing unlabored, no rales or wheezes Abdomen: Positive bowel sounds, soft nontender to palpation, nondistended Extremities: No clubbing, cyanosis, or edema Skin: No evidence of breakdown, no evidence of rash Neurologic: Cranial nerves II through XII intact, motor strength is 5/5 in left deltoid, bicep, tricep, grip, hip flexor, knee extensors, ankle dorsiflexor and plantar flexor 2 minus in the right deltoid, bicep, tricep, trace finger flexors, trace hip flexor 2 minus, knee extensor, trace ankle dorsiflexor [place] 3, finger flexors, wrist flexor and elbow flexor on the right side. Cerebellar exam normal finger to nose to finger as well as heel to shin in left upper and lower extremities, unable to perform due to motor weakness on the right side Musculoskeletal: Full range of motion in all 4 extremities. No joint swelling   Assessment/Plan: 1.  Functional deficits secondary to left ACA infarct with right hemiparesis which require 3+ hours per day of interdisciplinary therapy in a comprehensive inpatient rehab setting. Physiatrist is providing close team supervision and 24 hour management of active medical problems listed below. Physiatrist and rehab team continue to assess barriers to discharge/monitor patient progress toward functional and medical goals. FIM: Function - Bathing Position: Shower Body parts bathed by patient: Chest, Abdomen, Front perineal area, Buttocks, Right upper leg, Left upper leg, Right lower leg, Left  lower leg, Back, Right arm Body parts bathed by helper: Left arm Assist Level: Touching or steadying assistance(Pt > 75%)  Function- Upper Body Dressing/Undressing What is the patient wearing?: Pull over shirt/dress Pull over shirt/dress - Perfomed by patient: Thread/unthread right sleeve, Thread/unthread left sleeve, Put head through opening, Pull shirt over trunk Pull over shirt/dress - Perfomed by helper: Thread/unthread right sleeve, Thread/unthread left sleeve, Put head through opening, Pull shirt over trunk Assist Level: Supervision or verbal cues Function - Lower Body Dressing/Undressing What is the patient wearing?: Shoes, AFO, Pants, Ted Hose Position: Bed (supine to sit) Pants- Performed by patient: Thread/unthread right pants leg, Thread/unthread left pants leg, Pull pants up/down Pants- Performed by helper: Thread/unthread right pants leg, Thread/unthread left pants leg, Pull pants up/down Non-skid slipper socks- Performed by patient: Don/doff left sock, Don/doff right sock Non-skid slipper socks- Performed by helper: Don/doff right sock Shoes - Performed by patient: Don/doff right shoe, Don/doff left shoe AFO - Performed by patient: Don/doff right AFO TED Hose - Performed by helper: Don/doff right TED hose, Don/doff left TED hose Assist for footwear: Supervision/touching assist Assist for lower body dressing: Touching or steadying assistance (Pt > 75%)  Function - Toileting Toileting activity did not occur: No continent bowel/bladder event (urinal) Toileting steps completed by patient: Adjust clothing prior to toileting Toileting steps completed by helper: Adjust clothing prior to toileting, Performs perineal hygiene, Adjust clothing after toileting Toileting Assistive Devices: Grab bar or rail Assist level: Touching or steadying assistance (Pt.75%)  Function - Air cabin crew transfer activity did not occur: Refused Toilet transfer assistive device: Grab  bar Assist level to toilet: Moderate assist (Pt 50 - 74%/lift or lower) Assist level from toilet: Moderate assist (Pt 50 - 74%/lift or lower)  Function - Chair/bed transfer Chair/bed transfer method: Stand pivot Chair/bed transfer assist level: Supervision or verbal cues Chair/bed transfer assistive device: Armrests, Cane Chair/bed transfer details: Verbal cues for technique, Verbal cues for safe use of DME/AE, Manual facilitation for weight shifting, Verbal cues for sequencing  Function - Locomotion: Wheelchair Will patient use wheelchair at discharge?: No Type: Manual Max wheelchair distance: 127f  Assist Level: Supervision or verbal cues Wheel 50 feet with 2 turns activity did not occur: Safety/medical concerns Assist Level: Supervision or verbal cues Wheel 150 feet activity did not occur: Safety/medical concerns Assist Level: Supervision or verbal cues Turns around,maneuvers to table,bed, and toilet,negotiates 3% grade,maneuvers on rugs and over doorsills: No Function - Locomotion: Ambulation Assistive device: Cane-quad Max distance: 175 Assist level: Touching or steadying assistance (Pt > 75%) Assist level: Supervision or verbal cues Walk 50 feet with 2 turns activity did not occur: Safety/medical concerns Assist level: Touching or steadying assistance (Pt > 75%) Walk 150 feet activity did not occur: Safety/medical concerns Walk 10 feet on uneven surfaces activity did not occur: Safety/medical concerns Assist level: Touching or steadying assistance (Pt > 75%)  Function - Comprehension Comprehension: Auditory Comprehension assist level: Follows basic conversation/direction with extra time/assistive device  Function -  Expression Expression: Verbal Expression assist level: Expresses basic 50 - 74% of the time/requires cueing 25 - 49% of the time. Needs to repeat parts of sentences.  Function - Social Interaction Social Interaction assist level: Interacts appropriately 75 -  89% of the time - Needs redirection for appropriate language or to initiate interaction.  Function - Problem Solving Problem solving assist level: Solves basic 75 - 89% of the time/requires cueing 10 - 24% of the time  Function - Memory Memory assist level: Recognizes or recalls 75 - 89% of the time/requires cueing 10 - 24% of the time Patient normally able to recall (first 3 days only): Staff names and faces, That he or she is in a hospital, Current season  Medical Problem List and Plan: 1. 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 DVT Prophylaxis/Anticoagulation: Subcutaneous Lovenox. Monitor platelet counts and any signs of bleeding 3. Pain Management: Tylenol as needed -consider baclofen trial for spasticity, have discussed Botox as well 4. Mood: Zoloft 100 mg daily 5. Neuropsych: This patient iscapable of making decisions on hisown behalf. 6. Skin/Wound Care: Routine skin checks 7. Fluids/Electrolytes/Nutrition: Routine I&O with follow-up chemistries 8.Diabetes mellitus of peripheral neuropathy. Hemoglobin A1c 6.9.Levemir 30 units daily. Check blood sugars before meals and at bedtime. Diabetic teaching, good a.m. CBGs, added metformin 500 mg 2/5 with breakfast to help with daytime CBGs  No apparent improvement will increase to 1059m, reduced Levemir. 27 units, continue current dose CBG (last 3)   Recent Labs  07/09/16 2022 07/10/16 0629 07/10/16 1206  GLUCAP 177* 81 92       9.CRI stage II. Follow-up chemistries upon admit. 10.Hypertension. Permissive hypertension. Patient on Coreg 25 mg twice a day, HCTZ 25 mg daily, lisinopril 40 mg daily, Aldactone 50 mg daily, Norvasc 10 mg daily, Revatio 60 mg daily prior to admission. Resumed  amlodipine 133m continue current dose Vitals:   07/10/16 1448 07/11/16 0500  BP: (!) 161/50 (!) 148/66  Pulse: 70 (!) 58  Resp: 18 17  Temp: 97.7 F (36.5 C) 98.1 F (36.7 C)   11.Hyperlipidemia. Lipitor 12.Question medical compliance. Provide education 13. Leukocytosis, improving 15.7 on 2/2, 12.5 on 2/5, Monitor temperature, Repeat CBC today 14. Hypoalbuminemia. Moderate, pro-stat LOS (Days) 7 A FACE TO FACE EVALUATION WAS PERFORMED  KIRSTEINS,ANDREW E 07/11/2016, 8:20 AM

## 2016-07-11 NOTE — Progress Notes (Signed)
Occupational Therapy Session Note  Patient Details  Name: [name] MRN: [id number] Date of Birth: [birth year]  Today's Date: 07/11/2016 OT Individual Time: [id number] OT Individual Time Calculation (min): 44 min    Short Term Goals: Week 2:  OT Short Term Goal 1 (Week 2): Pt will continue working toward established LTGs set at overall modified independent to supervision level.    Skilled Therapeutic Interventions/Progress Updates:    Therapist applied NMES to the right digit extensors to start session.  Pt tolerated 25 mins of stimulation on custom program with intensity at 40, pulse width at 300, and PPS at 35.  Had pt progress to placing the RUE in weightbearing beside of him on the mat and working on small transitions of left lean to sitting up straight with emphasis on elbow extension without use of his head or left trunk flexors.  No adverse reactions to stimuli during session.  Educate pt on AROM exercises for elbow extension, sliding hand down toward his knee as well as digit extension to be performed in his room.  Finished session with return to the room with call button in reach.    Therapy Documentation Precautions:  Precautions Precautions: Fall Precaution Comments: Rt hemiparesis Restrictions Weight Bearing Restrictions: No  Pain: Pain Assessment Pain Assessment: No/denies pain Pain Score: 0-No pain ADL: See Function Navigator for Current Functional Status.   Therapy/Group: Individual Therapy  Tannar Broker OTR/L 07/11/2016, 1:45 PM

## 2016-07-12 ENCOUNTER — Inpatient Hospital Stay (HOSPITAL_COMMUNITY): Payer: Medicare Other | Admitting: Physical Therapy

## 2016-07-12 DIAGNOSIS — I10 Essential (primary) hypertension: Secondary | ICD-10-CM

## 2016-07-12 DIAGNOSIS — E1142 Type 2 diabetes mellitus with diabetic polyneuropathy: Secondary | ICD-10-CM

## 2016-07-12 DIAGNOSIS — G8110 Spastic hemiplegia affecting unspecified side: Secondary | ICD-10-CM

## 2016-07-12 DIAGNOSIS — E118 Type 2 diabetes mellitus with unspecified complications: Secondary | ICD-10-CM

## 2016-07-12 MED ORDER — HYDROCHLOROTHIAZIDE 25 MG PO TABS
25.0000 mg | ORAL_TABLET | Freq: Every day | ORAL | Status: DC
  Administered 2016-07-12 – 2016-07-13 (×2): 25 mg via ORAL
  Filled 2016-07-12 (×2): qty 1

## 2016-07-12 NOTE — Progress Notes (Signed)
Physical Therapy Session Note  Patient Details  Name: [name] MRN: [id number] Date of Birth: [birth year]  Today's Date: 07/12/2016 PT Individual Time: 1420-1500 PT Individual Time Calculation (min): 40 min   Short Term Goals: Week 1:  PT Short Term Goal 1 (Week 1): Pt will increase rolling with no rails to c/s. PT Short Term Goal 2 (Week 1): Pt will increase transfers in bed to S.  PT Short Term Goal 3 (Week 1): Pt will increase transfers to min A.  PT Short Term Goal 4 (Week 1): Pt will increase gait with WBQC to min A about 50 feet. PT Short Term Goal 5 (Week 1): Pt will ascend/descend 4 stairs with 2 rails and mod A.   Skilled Therapeutic Interventions/Progress Updates:   Pt received sitting in WC and agreeable to PT  PT instructed patient in WC mobility x 271f with supervisoin assist to ortho-gym. Min cues for doorway management.   Car transfer training with supervision assist to enter car and min assist to [facility] Min cues for use of stable surface to push to standing for improved safety.   Gait training x 1552fwith LBQC, R AFO, and  supervision assist from PT to rehab gym. Min cues for improved step-to gait pattern, improved erect posture, and increased foot clearance on the RLE  PT instructed patient in Step training (4 inch curb) with LBQC x 2 with min-mod assist from PT. Patient demonstrated self selected step -to pattern leading the RLE.  Nustep reciprocal movement training x 8 minutes level 4-5 with min cues from PT to improve  RLE ROM.   [facility] and all needs met.        Therapy Documentation Precautions:  Precautions Precautions: Fall Precaution Comments: Rt hemiparesis Restrictions Weight Bearing Restrictions: No   Vital Signs: Therapy Vitals Temp: 98.2 F (36.8 C) Temp Source: Oral Pulse Rate: 68 Resp: 17 BP: (!) 153/64 Patient Position (if appropriate):  Sitting Oxygen Therapy SpO2: 97 % O2 Device: Not Delivered Pain:   0/10  See Function Navigator for Current Functional Status.   Therapy/Group: Individual Therapy  AuLorie Phenix/03/2017, 3:39 PM

## 2016-07-12 NOTE — Progress Notes (Signed)
Subjective/Complaints: Pt seen laying in bed this AM.  He slept well overnight.    Review systems: Denies CP, SOB, N/V/D.  Objective: Vital Signs: Blood pressure (!) 148/51, pulse 67, temperature 98.5 F (36.9 C), temperature source Oral, resp. rate 18, height 5' 7"  (1.702 m), weight 109 kg (240 lb 6.4 oz), SpO2 100 %. No results found. Results for orders placed or performed during the hospital encounter of 07/04/16 (from the past 72 hour(s))  Glucose, capillary     Status: Abnormal   Collection Time: 07/09/16 11:40 AM  Result Value Ref Range   Glucose-Capillary 105 (H) 65 - 99 mg/dL  Glucose, capillary     Status: Abnormal   Collection Time: 07/09/16  8:22 PM  Result Value Ref Range   Glucose-Capillary 177 (H) 65 - 99 mg/dL  Glucose, capillary     Status: None   Collection Time: 07/10/16  6:29 AM  Result Value Ref Range   Glucose-Capillary 81 65 - 99 mg/dL   Comment 1 Notify RN   Glucose, capillary     Status: None   Collection Time: 07/10/16 12:06 PM  Result Value Ref Range   Glucose-Capillary 92 65 - 99 mg/dL  Glucose, capillary     Status: Abnormal   Collection Time: 07/10/16  4:50 PM  Result Value Ref Range   Glucose-Capillary 131 (H) 65 - 99 mg/dL  Glucose, capillary     Status: Abnormal   Collection Time: 07/10/16  8:48 PM  Result Value Ref Range   Glucose-Capillary 190 (H) 65 - 99 mg/dL  Creatinine, serum     Status: None   Collection Time: 07/11/16  4:10 AM  Result Value Ref Range   Creatinine, Ser 1.12 0.61 - 1.24 mg/dL   GFR calc non Af Amer >60 >60 mL/min   GFR calc Af Amer >60 >60 mL/min    Comment: (NOTE) The eGFR has been calculated using the CKD EPI equation. This calculation has not been validated in all clinical situations. eGFR's persistently <60 mL/min signify possible Chronic Kidney Disease.   Glucose, capillary     Status: Abnormal   Collection Time: 07/11/16 11:13 AM  Result Value Ref Range   Glucose-Capillary 108 (H) 65 - 99 mg/dL   Comment 1 Notify RN      General: No acute distress. Vital signs reviewed. Psych: Slowed Heart: Regular rate and rhythm. No JVD. Lungs: Clear to auscultation, breathing unlabored Abdomen: Positive bowel sounds, soft  Skin: No evidence of breakdown, Intact Neurologic: Alert Motor: 5/5 in left deltoid, bicep, tricep, grip, hip flexor, knee extensors, ankle dorsiflexor and plantar flexor 4-/5 minus in the right deltoid, bicep, tricep, finger flexors, hip flexor knee extensor, ankle dorsiflexor with apraxia Musculoskeletal: No edema. No tenderness  Assessment/Plan: 1. Functional deficits secondary to left ACA infarct with right hemiparesis which require 3+ hours per day of interdisciplinary therapy in a comprehensive inpatient rehab setting. Physiatrist is providing close team supervision and 24 hour management of active medical problems listed below. Physiatrist and rehab team continue to assess barriers to discharge/monitor patient progress toward functional and medical goals. FIM: Function - Bathing Position: Shower Body parts bathed by patient: Chest, Abdomen, Front perineal area, Buttocks, Right upper leg, Left upper leg, Right lower leg, Left lower leg, Back, Right arm Body parts bathed by helper: Left arm Assist Level: Touching or steadying assistance(Pt > 75%)  Function- Upper Body Dressing/Undressing What is the patient wearing?: Pull over shirt/dress Pull over shirt/dress - Perfomed by patient: Thread/unthread right  sleeve, Thread/unthread left sleeve, Put head through opening, Pull shirt over trunk Pull over shirt/dress - Perfomed by helper: Thread/unthread right sleeve, Thread/unthread left sleeve, Put head through opening, Pull shirt over trunk Assist Level: Supervision or verbal cues Function - Lower Body Dressing/Undressing What is the patient wearing?: Shoes, AFO, Pants, Ted Hose Position: Bed (supine to sit) Underwear - Performed by helper: Thread/unthread right  underwear leg, Thread/unthread left underwear leg, Pull underwear up/down Pants- Performed by patient: Thread/unthread right pants leg, Thread/unthread left pants leg, Pull pants up/down Pants- Performed by helper: Thread/unthread right pants leg, Thread/unthread left pants leg, Pull pants up/down Non-skid slipper socks- Performed by patient: Don/doff left sock, Don/doff right sock Non-skid slipper socks- Performed by helper: Don/doff right sock Shoes - Performed by patient: Don/doff right shoe, Don/doff left shoe AFO - Performed by patient: Don/doff right AFO TED Hose - Performed by helper: Don/doff right TED hose, Don/doff left TED hose Assist for footwear: Supervision/touching assist Assist for lower body dressing: Touching or steadying assistance (Pt > 75%)  Function - Toileting Toileting activity did not occur: No continent bowel/bladder event (urinal) Toileting steps completed by patient: Adjust clothing prior to toileting Toileting steps completed by helper: Adjust clothing prior to toileting, Performs perineal hygiene, Adjust clothing after toileting Toileting Assistive Devices: Grab bar or rail Assist level: Touching or steadying assistance (Pt.75%)  Function - Air cabin crew transfer activity did not occur: Refused Toilet transfer assistive device: Grab bar Assist level to toilet: Moderate assist (Pt 50 - 74%/lift or lower) Assist level from toilet: Moderate assist (Pt 50 - 74%/lift or lower)  Function - Chair/bed transfer Chair/bed transfer method: Stand pivot Chair/bed transfer assist level: Touching or steadying assistance (Pt > 75%) Chair/bed transfer assistive device: Cane Chair/bed transfer details: Verbal cues for technique, Verbal cues for safe use of DME/AE, Manual facilitation for weight shifting, Verbal cues for sequencing  Function - Locomotion: Wheelchair Will patient use wheelchair at discharge?: No Type: Manual Max wheelchair distance: 129f  Assist  Level: Supervision or verbal cues Wheel 50 feet with 2 turns activity did not occur: Safety/medical concerns Assist Level: Supervision or verbal cues Wheel 150 feet activity did not occur: Safety/medical concerns Assist Level: Supervision or verbal cues Turns around,maneuvers to table,bed, and toilet,negotiates 3% grade,maneuvers on rugs and over doorsills: No Function - Locomotion: Ambulation Assistive device: Cane-quad Max distance: 175 Assist level: Touching or steadying assistance (Pt > 75%) Assist level: Supervision or verbal cues Walk 50 feet with 2 turns activity did not occur: Safety/medical concerns Assist level: Touching or steadying assistance (Pt > 75%) Walk 150 feet activity did not occur: Safety/medical concerns Walk 10 feet on uneven surfaces activity did not occur: Safety/medical concerns Assist level: Touching or steadying assistance (Pt > 75%)  Function - Comprehension Comprehension: Auditory Comprehension assist level: Follows basic conversation/direction with extra time/assistive device  Function - Expression Expression: Verbal Expression assist level: Expresses basic 50 - 74% of the time/requires cueing 25 - 49% of the time. Needs to repeat parts of sentences.  Function - Social Interaction Social Interaction assist level: Interacts appropriately 75 - 89% of the time - Needs redirection for appropriate language or to initiate interaction.  Function - Problem Solving Problem solving assist level: Solves basic 75 - 89% of the time/requires cueing 10 - 24% of the time  Function - Memory Memory assist level: Recognizes or recalls 75 - 89% of the time/requires cueing 10 - 24% of the time Patient normally able to recall (first 3 days  only): Staff names and faces, That he or she is in a hospital, Current season  Medical Problem List and Plan: 1. 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 DVT Prophylaxis/Anticoagulation: Subcutaneous Lovenox. Monitor platelet counts and any signs of bleeding 3. Pain Management: Tylenol as needed 4. Mood: Zoloft 100 mg daily 5. Neuropsych: This patient iscapable of making decisions on hisown behalf. 6. Skin/Wound Care: Routine skin checks 7. Fluids/Electrolytes/Nutrition: Routine I&Os 8.Diabetes mellitus of peripheral neuropathy. Hemoglobin A1c 6.9.Levemir 30 units daily. Check blood sugars before meals and at bedtime. Diabetic teaching   added metformin 500 mg 2/5 with breakfast, increased to 1054m, reduced Levemir 27 units  Relatively controlled 2/10 9.CRI stage II. Follow-up chemistries  10.Hypertension. Patient on Coreg 25 mg twice a day, HCTZ 25 mg daily, lisinopril 40 mg daily, Aldactone 50 mg daily, Norvasc 10 mg daily, Revatio 60 mg daily prior to admission.   Resumed amlodipine 14m HCTZ restarted 2/10  11.Hyperlipidemia. Lipitor 12.Question medical compliance. Provide education 13. Leukocytosis: 12.5 on 2/5 14. Hypoalbuminemia. Moderate, pro-stat  LOS (Days) 8 A FACE TO FACE EVALUATION WAS PERFORMED  Jovannie Ulibarri AnLorie Phenix/03/2017, 9:53 AM

## 2016-07-13 ENCOUNTER — Inpatient Hospital Stay (HOSPITAL_COMMUNITY): Payer: Medicare Other | Admitting: Physical Therapy

## 2016-07-13 DIAGNOSIS — N182 Chronic kidney disease, stage 2 (mild): Secondary | ICD-10-CM

## 2016-07-13 DIAGNOSIS — E1159 Type 2 diabetes mellitus with other circulatory complications: Secondary | ICD-10-CM

## 2016-07-13 DIAGNOSIS — D72829 Elevated white blood cell count, unspecified: Secondary | ICD-10-CM

## 2016-07-13 DIAGNOSIS — Z794 Long term (current) use of insulin: Secondary | ICD-10-CM

## 2016-07-13 MED ORDER — CHLORTHALIDONE 25 MG PO TABS
25.0000 mg | ORAL_TABLET | Freq: Every day | ORAL | Status: DC
  Administered 2016-07-14 – 2016-07-18 (×5): 25 mg via ORAL
  Filled 2016-07-13 (×5): qty 1

## 2016-07-13 NOTE — Progress Notes (Signed)
Physical Therapy Session Note  Patient Details  Name: [name] MRN: [id number] Date of Birth: 1950/09/04  Today's Date: 07/13/2016 PT Individual Time: [id number] PT Individual Time Calculation (min): 45 min   Short Term Goals: Week 1:  PT Short Term Goal 1 (Week 1): Pt will increase rolling with no rails to c/s. PT Short Term Goal 2 (Week 1): Pt will increase transfers in bed to S.  PT Short Term Goal 3 (Week 1): Pt will increase transfers to min A.  PT Short Term Goal 4 (Week 1): Pt will increase gait with WBQC to min A about 50 feet. PT Short Term Goal 5 (Week 1): Pt will ascend/descend 4 stairs with 2 rails and mod A.   Skilled Therapeutic Interventions/Progress Updates:  Pt was seen bedside in the pm. Pt willing to participate with therapy. Pt performed all transfers with quad cane and S. Pt ambulated 175 feet x 2 with WBQC and c/s to min guard with verbal cues. Pt gym treatment focused on step taps with R LE only 3 sets x 10 reps each to increase R LE strength and assist with foot clearance during gait. Pt performed blocked practice 3 sets x 5 sit to stand transfers with S and verbal cues. Pt returned to room following treatment and left sitting up in bed with call bell within reach and quick release belt in place.   Therapy Documentation Precautions:  Precautions Precautions: Fall Precaution Comments: Rt hemiparesis Restrictions Weight Bearing Restrictions: No General:   Pain: No c/o pain.   See Function Navigator for Current Functional Status.   Therapy/Group: Individual Therapy  Dub Amis 07/13/2016, 3:49 PM

## 2016-07-13 NOTE — Progress Notes (Signed)
Subjective/Complaints: Pt seen laying in bed this AM.  He slept well overnight.  He denies complaints this AM.    Review systems: Denies CP, SOB, N/V/D.  Objective: Vital Signs: Blood pressure (!) 150/65, pulse 64, temperature 98.8 F (37.1 C), temperature source Oral, resp. rate 17, height _0  (1.702 m), weight 109 kg (240 lb 6.4 oz), SpO2 100 %. No results found. Results for orders placed or performed during the hospital encounter of 07/04/16 (from the past 72 hour(s))  Glucose, capillary     Status: Abnormal   Collection Time: 07/10/16  4:50 PM  Result Value Ref Range   Glucose-Capillary 131 (H) 65 - 99 mg/dL  Glucose, capillary     Status: Abnormal   Collection Time: 07/10/16  8:48 PM  Result Value Ref Range   Glucose-Capillary 190 (H) 65 - 99 mg/dL  Creatinine, serum     Status: None   Collection Time: 07/11/16  4:10 AM  Result Value Ref Range   Creatinine, Ser 1.12 0.61 - 1.24 mg/dL   GFR calc non Af Amer >60 >60 mL/min   GFR calc Af Amer >60 >60 mL/min    Comment: (NOTE) The eGFR has been calculated using the CKD EPI equation. This calculation has not been validated in all clinical situations. eGFR's persistently <60 mL/min signify possible Chronic Kidney Disease.   Glucose, capillary     Status: Abnormal   Collection Time: 07/11/16 11:13 AM  Result Value Ref Range   Glucose-Capillary 108 (H) 65 - 99 mg/dL   Comment 1 Notify RN      General: No acute distress. Vital signs reviewed. Psych: Slowed Heart: RRR. No JVD. Lungs: Clear to auscultation, breathing unlabored Abdomen: Positive bowel sounds, soft  Skin: No evidence of breakdown, Intact Neurologic: Alert and oriented x3 cues for date of month Motor: 5/5 in left deltoid, bicep, tricep, grip, hip flexor, knee extensors, ankle dorsiflexor and plantar flexor 4-/5 minus in the right deltoid, bicep, tricep, finger flexors, hip flexor knee extensor, ankle dorsiflexor with apraxia Musculoskeletal: No edema. No  tenderness  Assessment/Plan: 1. Functional deficits secondary to left ACA infarct with right hemiparesis which require 3+ hours per day of interdisciplinary therapy in a comprehensive inpatient rehab setting. Physiatrist is providing close team supervision and 24 hour management of active medical problems listed below. Physiatrist and rehab team continue to assess barriers to discharge/monitor patient progress toward functional and medical goals. FIM: Function - Bathing Position: Shower Body parts bathed by patient: Chest, Abdomen, Front perineal area, Buttocks, Right upper leg, Left upper leg, Right lower leg, Left lower leg, Back, Right arm Body parts bathed by helper: Left arm Assist Level: Touching or steadying assistance(Pt > 75%)  Function- Upper Body Dressing/Undressing What is the patient wearing?: Pull over shirt/dress Pull over shirt/dress - Perfomed by patient: Thread/unthread right sleeve, Thread/unthread left sleeve, Put head through opening, Pull shirt over trunk Pull over shirt/dress - Perfomed by helper: Thread/unthread right sleeve, Thread/unthread left sleeve, Put head through opening, Pull shirt over trunk Assist Level: Supervision or verbal cues Function - Lower Body Dressing/Undressing What is the patient wearing?: Shoes, AFO, Pants, Ted Hose Position: Bed (supine to sit) Underwear - Performed by helper: Thread/unthread right underwear leg, Thread/unthread left underwear leg, Pull underwear up/down Pants- Performed by patient: Thread/unthread right pants leg, Thread/unthread left pants leg, Pull pants up/down Pants- Performed by helper: Thread/unthread right pants leg, Thread/unthread left pants leg, Pull pants up/down Non-skid slipper socks- Performed by patient: Don/doff left sock, Don/doff  right sock Non-skid slipper socks- Performed by helper: Don/doff right sock Shoes - Performed by patient: Don/doff right shoe, Don/doff left shoe AFO - Performed by patient:  Don/doff right AFO TED Hose - Performed by helper: Don/doff right TED hose, Don/doff left TED hose Assist for footwear: Supervision/touching assist Assist for lower body dressing: Touching or steadying assistance (Pt > 75%)  Function - Toileting Toileting activity did not occur: No continent bowel/bladder event (urinal) Toileting steps completed by patient: Performs perineal hygiene Toileting steps completed by helper: Adjust clothing prior to toileting, Adjust clothing after toileting Toileting Assistive Devices: Grab bar or rail Assist level: Touching or steadying assistance (Pt.75%)  Function - Air cabin crew transfer activity did not occur: Refused Toilet transfer assistive device: Grab bar Assist level to toilet: Moderate assist (Pt 50 - 74%/lift or lower) Assist level from toilet: Moderate assist (Pt 50 - 74%/lift or lower)  Function - Chair/bed transfer Chair/bed transfer method: Stand pivot Chair/bed transfer assist level: Supervision or verbal cues Chair/bed transfer assistive device: Cane Chair/bed transfer details: Verbal cues for technique, Verbal cues for safe use of DME/AE, Manual facilitation for weight shifting, Verbal cues for sequencing  Function - Locomotion: Wheelchair Will patient use wheelchair at discharge?: No Type: Manual Max wheelchair distance: 149f  Assist Level: Supervision or verbal cues Wheel 50 feet with 2 turns activity did not occur: Safety/medical concerns Assist Level: Supervision or verbal cues Wheel 150 feet activity did not occur: Safety/medical concerns Assist Level: Supervision or verbal cues Turns around,maneuvers to table,bed, and toilet,negotiates 3% grade,maneuvers on rugs and over doorsills: No Function - Locomotion: Ambulation Assistive device: Cane-quad Max distance: 175 Assist level: Touching or steadying assistance (Pt > 75%) Assist level: Touching or steadying assistance (Pt > 75%) Walk 50 feet with 2 turns activity  did not occur: Safety/medical concerns Assist level: Touching or steadying assistance (Pt > 75%) Walk 150 feet activity did not occur: Safety/medical concerns Assist level: Touching or steadying assistance (Pt > 75%) Walk 10 feet on uneven surfaces activity did not occur: Safety/medical concerns Assist level: Touching or steadying assistance (Pt > 75%)  Function - Comprehension Comprehension: Auditory Comprehension assist level: Follows basic conversation/direction with no assist  Function - Expression Expression: Verbal Expression assist level: Expresses basic 75 - 89% of the time/requires cueing 10 - 24% of the time. Needs helper to occlude trach/needs to repeat words.  Function - Social Interaction Social Interaction assist level: Interacts appropriately 75 - 89% of the time - Needs redirection for appropriate language or to initiate interaction.  Function - Problem Solving Problem solving assist level: Solves basic 50 - 74% of the time/requires cueing 25 - 49% of the time  Function - Memory Memory assist level: Recognizes or recalls 75 - 89% of the time/requires cueing 10 - 24% of the time Patient normally able to recall (first 3 days only): Staff names and faces, That he or she is in a hospital, Current season, Location of own room  Medical Problem List and Plan: 1. 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 DVT Prophylaxis/Anticoagulation: Subcutaneous Lovenox. Monitor platelet counts and any signs of bleeding 3. Pain Management: Tylenol as needed 4. Mood: Zoloft 100 mg daily 5. Neuropsych: This patient iscapable of making decisions on hisown behalf. 6. Skin/Wound Care: Routine skin checks 7. Fluids/Electrolytes/Nutrition:  Routine I&Os 8.Diabetes mellitus of  peripheral neuropathy. Hemoglobin A1c 6.9.Levemir 30 units daily. Check blood sugars before meals and at bedtime. Diabetic teaching   added metformin 500 mg 2/5 with breakfast, increased to 1048m, reduced Levemir 27 units  Slightly labile, but overall controlled 2/11 9.CRI stage II.   Labs ordered for tomorrow 10.Hypertension. Patient on Coreg 25 mg twice a day, HCTZ 25 mg daily, lisinopril 40 mg daily, Aldactone 50 mg daily, Norvasc 10 mg daily, Revatio 60 mg daily prior to admission.   Resumed amlodipine 181m HCTZ restarted 2/10, changed to chlorthalidone on 2/11 11.Hyperlipidemia. Lipitor 12.Question medical compliance. Provide education 13. Leukocytosis: 12.5 on 2/5  Labs ordered for tomorrow 14. Hypoalbuminemia. Moderate, pro-stat  LOS (Days) 9 A FACE TO FACE EVALUATION WAS PERFORMED  Jafari Mckillop AnLorie Phenix/04/2017, 2:29 PM

## 2016-07-14 ENCOUNTER — Inpatient Hospital Stay (HOSPITAL_COMMUNITY): Payer: Medicare Other

## 2016-07-14 ENCOUNTER — Inpatient Hospital Stay (HOSPITAL_COMMUNITY): Payer: Medicare Other | Admitting: Speech Pathology

## 2016-07-14 ENCOUNTER — Inpatient Hospital Stay (HOSPITAL_COMMUNITY): Payer: Medicare Other | Admitting: Occupational Therapy

## 2016-07-14 LAB — GLUCOSE, CAPILLARY
GLUCOSE-CAPILLARY: 165 mg/dL — AB (ref 65–99)
GLUCOSE-CAPILLARY: 153 mg/dL — AB (ref 65–99)
GLUCOSE-CAPILLARY: 145 mg/dL — AB (ref 65–99)
GLUCOSE-CAPILLARY: 138 mg/dL — AB (ref 65–99)
GLUCOSE-CAPILLARY: 102 mg/dL — AB (ref 65–99)
GLUCOSE-CAPILLARY: 95 mg/dL (ref 65–99)
Glucose-Capillary: 138 mg/dL — ABNORMAL HIGH (ref 65–99)
Glucose-Capillary: 103 mg/dL — ABNORMAL HIGH (ref 65–99)
Glucose-Capillary: 75 mg/dL (ref 65–99)
Glucose-Capillary: 149 mg/dL — ABNORMAL HIGH (ref 65–99)
Glucose-Capillary: 100 mg/dL — ABNORMAL HIGH (ref 65–99)
Glucose-Capillary: 94 mg/dL (ref 65–99)

## 2016-07-14 NOTE — Progress Notes (Signed)
Subjective/Complaints: Pt sitting up in w/c finishing the rest of his breakfast. Denies new problems. Strong odor of urine in room  ROS: Limited due cognitive/behavioral   Objective: Vital Signs: Blood pressure (!) 162/63, pulse (!) 56, temperature 97.7 F (36.5 C), temperature source Oral, resp. rate 18, height 5\' 7"  (1.702 m), weight 109 kg (240 lb 6.4 oz), SpO2 100 %. No results found. Results for orders placed or performed during the hospital encounter of 07/04/16 (from the past 72 hour(s))  Glucose, capillary     Status: Abnormal   Collection Time: 07/11/16 11:13 AM  Result Value Ref Range   Glucose-Capillary 108 (H) 65 - 99 mg/dL   Comment 1 Notify RN      General: No acute distress. Vital signs reviewed. Psych: Slowed Heart: RRR. Lungs: clear bilaterally Abdomen: Positive bowel sounds, soft  Skin: No evidence of breakdown, Intact Neurologic: Alert and oriented x3 cues for date of month. Still with delays in expressive language Motor: 5/5 in left deltoid, bicep, tricep, grip, hip flexor, knee extensors, ankle dorsiflexor and plantar flexor 4-/5 minus in the right deltoid, bicep, tricep, finger flexors, hip flexor knee extensor, ankle dorsiflexor with apraxia Musculoskeletal: No edema. No tenderness  Assessment/Plan: 1. Functional deficits secondary to left ACA infarct with right hemiparesis which require 3+ hours per day of interdisciplinary therapy in a comprehensive inpatient rehab setting. Physiatrist is providing close team supervision and 24 hour management of active medical problems listed below. Physiatrist and rehab team continue to assess barriers to discharge/monitor patient progress toward functional and medical goals. FIM: Function - Bathing Position: Shower Body parts bathed by patient: Chest, Abdomen, Front perineal area, Buttocks, Right upper leg, Left upper leg, Right lower leg, Left lower leg, Back, Right arm Body parts bathed by helper: Left  arm Assist Level: Touching or steadying assistance(Pt > 75%)  Function- Upper Body Dressing/Undressing What is the patient wearing?: Pull over shirt/dress Pull over shirt/dress - Perfomed by patient: Thread/unthread right sleeve, Thread/unthread left sleeve, Put head through opening, Pull shirt over trunk Pull over shirt/dress - Perfomed by helper: Thread/unthread right sleeve, Thread/unthread left sleeve, Put head through opening, Pull shirt over trunk Assist Level: Supervision or verbal cues Function - Lower Body Dressing/Undressing What is the patient wearing?: Shoes, AFO, Pants, Ted Hose Position: Bed (supine to sit) Underwear - Performed by helper: Thread/unthread right underwear leg, Thread/unthread left underwear leg, Pull underwear up/down Pants- Performed by patient: Thread/unthread right pants leg, Thread/unthread left pants leg, Pull pants up/down Pants- Performed by helper: Thread/unthread right pants leg, Thread/unthread left pants leg, Pull pants up/down Non-skid slipper socks- Performed by patient: Don/doff left sock, Don/doff right sock Non-skid slipper socks- Performed by helper: Don/doff right sock Shoes - Performed by patient: Don/doff right shoe, Don/doff left shoe AFO - Performed by patient: Don/doff right AFO TED Hose - Performed by helper: Don/doff right TED hose, Don/doff left TED hose Assist for footwear: Supervision/touching assist Assist for lower body dressing: Touching or steadying assistance (Pt > 75%)  Function - Toileting Toileting activity did not occur: No continent bowel/bladder event (urinal) Toileting steps completed by patient: Performs perineal hygiene Toileting steps completed by helper: Adjust clothing prior to toileting, Adjust clothing after toileting Toileting Assistive Devices: Grab bar or rail Assist level: Touching or steadying assistance (Pt.75%)  Function - Air cabin crew transfer activity did not occur: Refused Toilet transfer  assistive device: Grab bar Assist level to toilet: Moderate assist (Pt 50 - 74%/lift or lower) Assist level from toilet:  Moderate assist (Pt 50 - 74%/lift or lower)  Function - Chair/bed transfer Chair/bed transfer method: Stand pivot Chair/bed transfer assist level: Supervision or verbal cues Chair/bed transfer assistive device:  (quad cane) Chair/bed transfer details: Verbal cues for technique, Verbal cues for safe use of DME/AE, Manual facilitation for weight shifting, Verbal cues for sequencing  Function - Locomotion: Wheelchair Will patient use wheelchair at discharge?: No Type: Manual Max wheelchair distance: 137ft  Assist Level: Supervision or verbal cues Wheel 50 feet with 2 turns activity did not occur: Safety/medical concerns Assist Level: Supervision or verbal cues Wheel 150 feet activity did not occur: Safety/medical concerns Assist Level: Supervision or verbal cues Turns around,maneuvers to table,bed, and toilet,negotiates 3% grade,maneuvers on rugs and over doorsills: No Function - Locomotion: Ambulation Assistive device: Cane-quad Max distance: 175 Assist level: Touching or steadying assistance (Pt > 75%) Assist level: Touching or steadying assistance (Pt > 75%) Walk 50 feet with 2 turns activity did not occur: Safety/medical concerns Assist level: Touching or steadying assistance (Pt > 75%) Walk 150 feet activity did not occur: Safety/medical concerns Assist level: Touching or steadying assistance (Pt > 75%) Walk 10 feet on uneven surfaces activity did not occur: Safety/medical concerns Assist level: Touching or steadying assistance (Pt > 75%)  Function - Comprehension Comprehension: Auditory Comprehension assist level: Understands basic 75 - 89% of the time/ requires cueing 10 - 24% of the time  Function - Expression Expression: Verbal Expression assist level: Expresses basic 75 - 89% of the time/requires cueing 10 - 24% of the time. Needs helper to occlude  trach/needs to repeat words.  Function - Social Interaction Social Interaction assist level: Interacts appropriately 75 - 89% of the time - Needs redirection for appropriate language or to initiate interaction.  Function - Problem Solving Problem solving assist level: Solves basic 50 - 74% of the time/requires cueing 25 - 49% of the time  Function - Memory Memory assist level: Recognizes or recalls 50 - 74% of the time/requires cueing 25 - 49% of the time Patient normally able to recall (first 3 days only): Staff names and faces, That he or she is in a hospital, Current season, Location of own room  Medical Problem List and Plan: 1. 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 DVT Prophylaxis/Anticoagulation: Subcutaneous Lovenox. Monitor platelet counts and any signs of bleeding 3. Pain Management: Tylenol as needed 4. Mood: Zoloft 100 mg daily 5. Neuropsych: This patient iscapable of making decisions on hisown behalf. 6. Skin/Wound Care: Routine skin checks 7. Fluids/Electrolytes/Nutrition: Routine I&Os 8.Diabetes mellitus of peripheral neuropathy. Hemoglobin A1c 6.9.Levemir 30 units daily. Check blood sugars before meals and at bedtime. Diabetic teaching   added metformin 500 mg 2/5 with breakfast, increased to 1000mg , reduced Levemir 27 units  Slightly labile, but overall improved control 2/12 9.CRI stage II.   Labs pending 10.Hypertension. Patient on Coreg 25 mg twice a day, HCTZ 25 mg daily, lisinopril 40 mg daily, Aldactone 50 mg daily, Norvasc 10 mg daily, Revatio 60 mg daily prior to admission.   Resumed amlodipine 10mg   HCTZ restarted 2/10, changed to chlorthalidone on 2/11  -borderline control---monitor 11.Hyperlipidemia. Lipitor 12.Question medical compliance.  Provide education 13. Leukocytosis: 12.5 on 2/5  Labs pending 14. Hypoalbuminemia. Moderate, pro-stat 15. Malodorous urine/incontinence: check ucx  LOS (Days) 10 A FACE  TO FACE EVALUATION WAS PERFORMED  SWARTZ,ZACHARY T 07/14/2016, 9:15 AM

## 2016-07-14 NOTE — Progress Notes (Signed)
Physical Therapy Weekly Progress Note  Patient Details  Name: [name] MRN: [id number] Date of Birth: 06/04/1950  Beginning of progress report period: July 05, 2016 End of progress report period: July 14, 2016  Today's Date: 07/14/2016 PT Individual Time: 1300-1415 PT Individual Time Calculation (min): 75 min  Denies pain. Transfer from w/c using grab bar with steadying assist and cues for hand placement to use toilet in standing with overall close supervision to steadying assist balance during clothing management and urination. W/c propulsion down to therapy gym with hemi-technique with overall supervision. Pt completed basic transfers during session using quad cane with overall close supervision. Neuro re-ed for dynamic standing balance, coordination, postural control, and forced using during standing ball toss activity and kicking a ball in standing (RLE and LLE x 10 reps each side). nustep for reciprocal movement pattern re-training and forced use of RUE x 10 min on level 4. Gait with SPC on unit > 150' with overall supervision to min assist occasionally for balance recovery due to decreased clearance on RLE. Decreased gait velocity noted. Stair negotiation training for functional strengthening and balance x 4 steps with LUE support on rail with cues for foot placement. Pt required min assist for ascending stairs but mod assist for descending due to impaired eccentric control.    Patient has met 5 of 5 short term goals. Pt is making good progress towards overall goals of supervision/modified independent. Pt is using R AFO and WBQC for functional mobility at overall close supervision to min assist level. Pt continues to demonstrate decreased balance strategies with functional ambulation and mobility in tight spaces. Planned for d/c on 2/17 with goals overall modified independent with plan for intermittent assist.   Patient continues to demonstrate the following deficits muscle  weakness, muscle joint tightness and muscle paralysis, decreased cardiorespiratoy endurance, impaired timing and sequencing and decreased coordination, decreased awareness and decreased memory and decreased standing balance, decreased postural control, hemiplegia and decreased balance strategies and therefore will continue to benefit from skilled PT intervention to increase functional independence with mobility.  Patient progressing toward long term goals..  Continue plan of care.  PT Short Term Goals Week 1:  PT Short Term Goal 1 (Week 1): Pt will increase rolling with no rails to c/s. PT Short Term Goal 1 - Progress (Week 1): Met PT Short Term Goal 2 (Week 1): Pt will increase transfers in bed to S.  PT Short Term Goal 2 - Progress (Week 1): Met PT Short Term Goal 3 (Week 1): Pt will increase transfers to min A.  PT Short Term Goal 3 - Progress (Week 1): Met PT Short Term Goal 4 (Week 1): Pt will increase gait with WBQC to min A about 50 feet. PT Short Term Goal 4 - Progress (Week 1): Met PT Short Term Goal 5 (Week 1): Pt will ascend/descend 4 stairs with 2 rails and mod A.  PT Short Term Goal 5 - Progress (Week 1): Met Week 2:  PT Short Term Goal 1 (Week 2): = LTGs due to LOS  Skilled Therapeutic Interventions/Progress Updates:  Ambulation/gait training;Balance/vestibular training;Discharge planning;Functional electrical stimulation;DME/adaptive equipment instruction;Functional mobility training;Neuromuscular re-education;Patient/family education;Stair training;Therapeutic Activities;Therapeutic Exercise;UE/LE Strength taining/ROM;UE/LE Coordination activities;Cognitive remediation/compensation;Community reintegration;Disease management/prevention;Pain management;Psychosocial support;Skin care/wound management;Splinting/orthotics;Wheelchair propulsion/positioning;Visual/perceptual remediation/compensation   Therapy Documentation Precautions:  Precautions Precautions: Fall Precaution  Comments: Rt hemiparesis Restrictions Weight Bearing Restrictions: No  Pain: Denies pain.  See Function Navigator for Current Functional Status.  Therapy/Group: Individual Therapy  Canary Brim Skeet Simmer  Atlee Abide, PT, DPT  07/14/2016, 2:03 PM

## 2016-07-14 NOTE — Progress Notes (Signed)
Speech Language Pathology Daily Session Note  Patient Details  Name: [name] MRN: [id number] Date of Birth: Nov 01, 1950  Today's Date: 07/14/2016 SLP Individual Time: 0830-0930 SLP Individual Time Calculation (min): 60 min  Short Term Goals: Week 2: SLP Short Term Goal 1 (Week 2): Patient will request help as needed for safety with completion of basic self-care tasks with supervision question cues.  SLP Short Term Goal 2 (Week 2): Patient will self-monitor and correct errors during mildly complex problem solving tasks with supervision question cues. SLP Short Term Goal 3 (Week 2): Patient will produce final sounds of words during sentence level verbal expression tasks with Mod assist mulimodal cues. SLP Short Term Goal 4 (Week 2): Patient will produce /l/ in all positions of words with min assist verbal and visual cues to elevate tongue.  SLP Short Term Goal 5 (Week 2): Patient will utilize lingual sweep to manage right sided buccal pocketing Mod I.   Skilled Therapeutic Interventions: Skilled treatment session focused on addressing communication and cognition goals. SLP facilitated session by providing Mod faded to Min assist verbal and visual cues to produce final sounds during a structured sentence level tasks.  Patient also completed a mock medication management task with Supervision question cues to recognize and correct errors.  Plan for patient specific medication management task at next visit.  Continue with current plan of care.    Function:  Eating Eating   Modified Consistency Diet: Yes Eating Assist Level: Set up assist for;Supervision or verbal cues   Eating Set Up Assist For: Opening containers       Cognition Comprehension Comprehension assist level: Follows basic conversation/direction with extra time/assistive device  Expression   Expression assist level: Expresses basic 75 - 89% of the time/requires cueing 10 - 24% of the time. Needs helper to occlude  trach/needs to repeat words.  Social Interaction Social Interaction assist level: Interacts appropriately 75 - 89% of the time - Needs redirection for appropriate language or to initiate interaction.  Problem Solving Problem solving assist level: Solves basic 75 - 89% of the time/requires cueing 10 - 24% of the time  Memory Memory assist level: Recognizes or recalls 75 - 89% of the time/requires cueing 10 - 24% of the time    Pain Pain Assessment Pain Assessment: No/denies pain Pain Score: 0-No pain  Therapy/Group: Individual Therapy  Carmelia Roller., CCC-SLP [id number]  [place] 07/14/2016, 12:10 PM

## 2016-07-14 NOTE — Progress Notes (Signed)
Social Work Patient ID: [name], male   DOB: 12-18-1950, 66 y.o.   MRN: [id number]  Pt is interested in [facility] or [facility]. Have given his brochures and he will pursue when he is discharged at the end of the week. He feels he is doing better and would like to go home sooner than 2/17, but want to make sure he is ready and safe to be home alone with only intermittent assist by family. Will continue to work on discharge plans For Sat.

## 2016-07-14 NOTE — Progress Notes (Signed)
Occupational Therapy Session Note  Patient Details  Name: [name] MRN: [id number] Date of Birth: 1950/06/19  Today's Date: 07/14/2016 OT Individual Time: 1001-1057 OT Individual Time Calculation (min): 56 min    Short Term Goals: Week 2:  OT Short Term Goal 1 (Week 2): Pt will continue working toward established LTGs set at overall modified independent to supervision level.    Skilled Therapeutic Interventions/Progress Updates:    Pt completed shower and dressing during session.  Min guard assist for all transfers to the toilet, shower bench, and the wheelchair with use of the wide based quad cane.  Bathing with min guard assist utilizing the [facility] sponge for washing his feet, back, underarms, and the left arm.  One LOB posteriorly noted with standing when washing and rinsing his peri area.  He was able to complete dressing supine to sit EOB.  Transfers to supine to thread all clothing on the RLE with transition to sitting to donn the right and then stand to pull them over his hips.  Mod instructional cueing to scoot back on the bed secondary to decreased awareness of getting too close to the edge when attempting to donn the left pants leg or when attempting to place the right heel into his shoe.  Completed functional mobility at end of session to the [facility] gym with use of the quad cane and supervision.  Returned back to room with pt left in wheelchair at end of session with safety belt in place and call button in reach.    Therapy Documentation Precautions:  Precautions Precautions: Fall Precaution Comments: Rt hemiparesis Restrictions Weight Bearing Restrictions: No  Pain: Pain Assessment Pain Assessment: No/denies pain Pain Score: 0-No pain ADL: See Function Navigator for Current Functional Status.   Therapy/Group: Individual Therapy  Akhila Mahnken OTR/L 07/14/2016, 12:38 PM

## 2016-07-14 NOTE — Discharge Instructions (Signed)
Inpatient Rehab Discharge Instructions  [name] Discharge date and time: No discharge date for patient encounter.   Activities/Precautions/ Functional Status: Activity: activity as tolerated Diet: diabetic diet Wound Care: none needed Functional status:  ___ No restrictions     ___ Walk up steps independently ___ 24/7 supervision/assistance   ___ Walk up steps with assistance ___ Intermittent supervision/assistance  ___ Bathe/dress independently ___ Walk with walker     _x__ Bathe/dress with assistance ___ Walk Independently    ___ Shower independently ___ Walk with assistance    ___ Shower with assistance ___ No alcohol     ___ Return to work/school ________  Special Instructions: Continue aspirin and Plavix 3 months then Plavix alone STROKE/TIA DISCHARGE INSTRUCTIONS SMOKING Cigarette smoking nearly doubles your risk of having a stroke & is the single most alterable risk factor  If you smoke or have smoked in the last 12 months, you are advised to quit smoking for your health.  Most of the excess cardiovascular risk related to smoking disappears within a year of stopping.  Ask you doctor about anti-smoking medications  [place] Quit Line: 1-800-QUIT NOW  Free Smoking Cessation Classes [phone]  CHOLESTEROL Know your levels; limit fat & cholesterol in your diet  Lipid Panel     Component Value Date/Time   CHOL 91 07/04/2016 0322   TRIG 43 07/04/2016 0322   HDL 22 (L) 07/04/2016 0322   CHOLHDL 4.1 07/04/2016 0322   VLDL 9 07/04/2016 0322   LDLCALC 60 07/04/2016 0322      Many patients benefit from treatment even if their cholesterol is at goal.  Goal: Total Cholesterol (CHOL) less than 160  Goal:  Triglycerides (TRIG) less than 150  Goal:  HDL greater than 40  Goal:  LDL (LDLCALC) less than 100   BLOOD PRESSURE American Stroke Association blood pressure target is less that 120/80 mm/Hg  Your discharge blood pressure is:  BP: (!) 162/63  Monitor your  blood pressure  Limit your salt and alcohol intake  Many individuals will require more than one medication for high blood pressure  DIABETES (A1c is a blood sugar average for last 3 months) Goal HGBA1c is under 7% (HBGA1c is blood sugar average for last 3 months)  Diabetes:   Lab Results  Component Value Date   HGBA1C 6.9 (H) 07/03/2016     Your HGBA1c can be lowered with medications, healthy diet, and exercise.  Check your blood sugar as directed by your physician  Call your physician if you experience unexplained or low blood sugars.  PHYSICAL ACTIVITY/REHABILITATION Goal is 30 minutes at least 4 days per week  Activity: Increase activity slowly, Therapies: Physical Therapy: Home Health Return to work:   Activity decreases your risk of heart attack and stroke and makes your heart stronger.  It helps control your weight and blood pressure; helps you relax and can improve your mood.  Participate in a regular exercise program.  Talk with your doctor about the best form of exercise for you (dancing, walking, swimming, cycling).  DIET/WEIGHT Goal is to maintain a healthy weight  Your discharge diet is: Diet Carb Modified Fluid consistency: Thin; Room service appropriate? Yes  liquids Your height is:  Height: 5\' 7"  (170.2 cm) Your current weight is: Weight: 109 kg (240 lb 6.4 oz) Your Body Mass Index (BMI) is:     Following the type of diet specifically designed for you will help prevent another stroke.  Your goal weight range is:  Your goal Body Mass Index (BMI) is 19-24.  Healthy food habits can help reduce 3 risk factors for stroke:  High cholesterol, hypertension, and excess weight.  RESOURCES Stroke/Support Group:  Call [phone]   STROKE EDUCATION PROVIDED/REVIEWED AND GIVEN TO PATIENT Stroke warning signs and symptoms How to activate emergency medical system (call 911). Medications prescribed at discharge. Need for follow-up after discharge. Personal risk factors  for stroke. Pneumonia vaccine given:  Flu vaccine given:  My questions have been answered, the writing is legible, and I understand these instructions.  I will adhere to these goals & educational materials that have been provided to me after my discharge from the hospital.      My questions have been answered and I understand these instructions. I will adhere to these goals and the provided educational materials after my discharge from the hospital.  Patient/Caregiver Signature _______________________________ Date __________  Clinician Signature _______________________________________ Date __________  Please bring this form and your medication list with you to all your follow-up doctor's appointments.

## 2016-07-15 ENCOUNTER — Inpatient Hospital Stay (HOSPITAL_COMMUNITY): Payer: Medicare Other | Admitting: Occupational Therapy

## 2016-07-15 ENCOUNTER — Inpatient Hospital Stay (HOSPITAL_COMMUNITY): Payer: Medicare Other | Admitting: Speech Pathology

## 2016-07-15 ENCOUNTER — Inpatient Hospital Stay (HOSPITAL_COMMUNITY): Payer: Medicare Other | Admitting: Physical Therapy

## 2016-07-15 LAB — GLUCOSE, CAPILLARY
Glucose-Capillary: 173 mg/dL — ABNORMAL HIGH (ref 65–99)
Glucose-Capillary: 221 mg/dL — ABNORMAL HIGH (ref 65–99)
Glucose-Capillary: 101 mg/dL — ABNORMAL HIGH (ref 65–99)
Glucose-Capillary: 104 mg/dL — ABNORMAL HIGH (ref 65–99)

## 2016-07-15 LAB — URINE CULTURE

## 2016-07-15 NOTE — Progress Notes (Signed)
Occupational Therapy Session Note  Patient Details  Name: TRUMAINE MUSSELMAN MRN: [id number] Date of Birth: [birth year]  Today's Date: 07/15/2016 OT Individual Time: 1016-1056 OT Individual Time Calculation (min): 40 min    Short Term Goals: Week 2:  OT Short Term Goal 1 (Week 2): Pt will continue working toward established LTGs set at overall modified independent to supervision level.    Skilled Therapeutic Interventions/Progress Updates:    Pt completed shower and dressing during session.  Supervision for transfer into the shower seat with use of the quad cane as well as back out to the EOB once shower was completed.  He utilized the [facility] sponge for washing his back, RUE, and feet with supervision.  Completed dressing sit to supine with min guard assist as he gets too close to the edge when working on donning clothing over the LLE after sitting up.  In addition, he needed min assist for donning the right AFO and almost lost his balance to the right when sitting EOB and attempting to strap the buckle.  He needed min instructional cueing to correct and scoot further back on the bed and re-try.  He transferred to the wheelchair with close supervision using the quad cane.  Pt left in wheelchair with call button and phone in reach with safety belt in place.    Therapy Documentation Precautions:  Precautions Precautions: Fall Precaution Comments: Rt hemiparesis Restrictions Weight Bearing Restrictions: No  Pain: Pain Assessment Pain Assessment: No/denies pain ADL: See Function Navigator for Current Functional Status.   Therapy/Group: Individual Therapy  [place] OTR/L 07/15/2016, 12:48 PM

## 2016-07-15 NOTE — Progress Notes (Signed)
Subjective/Complaints: Pt vigorously eating breakfast. No new complaints. Anxious to go home.  ROS: Limited due cognitive/behavioral    Objective: Vital Signs: Blood pressure (!) 167/69, pulse 62, temperature 98.1 F (36.7 C), temperature source Oral, resp. rate 18, height 5\' 7"  (1.702 m), weight 109 kg (240 lb 6.4 oz), SpO2 100 %. No results found. Results for orders placed or performed during the hospital encounter of 07/04/16 (from the past 72 hour(s))  Glucose, capillary     Status: None   Collection Time: 07/12/16 11:42 AM  Result Value Ref Range   Glucose-Capillary 75 65 - 99 mg/dL   Comment 1 Notify RN   Glucose, capillary     Status: Abnormal   Collection Time: 07/12/16  4:48 PM  Result Value Ref Range   Glucose-Capillary 153 (H) 65 - 99 mg/dL   Comment 1 Notify RN   Glucose, capillary     Status: Abnormal   Collection Time: 07/12/16  8:49 PM  Result Value Ref Range   Glucose-Capillary 149 (H) 65 - 99 mg/dL  Glucose, capillary     Status: Abnormal   Collection Time: 07/13/16  6:50 AM  Result Value Ref Range   Glucose-Capillary 100 (H) 65 - 99 mg/dL   Comment 1 Notify RN   Glucose, capillary     Status: None   Collection Time: 07/13/16 11:53 AM  Result Value Ref Range   Glucose-Capillary 94 65 - 99 mg/dL  Glucose, capillary     Status: Abnormal   Collection Time: 07/13/16  4:54 PM  Result Value Ref Range   Glucose-Capillary 145 (H) 65 - 99 mg/dL  Glucose, capillary     Status: Abnormal   Collection Time: 07/13/16  8:43 PM  Result Value Ref Range   Glucose-Capillary 138 (H) 65 - 99 mg/dL  Glucose, capillary     Status: Abnormal   Collection Time: 07/14/16  6:58 AM  Result Value Ref Range   Glucose-Capillary 102 (H) 65 - 99 mg/dL   Comment 1 Notify RN   Glucose, capillary     Status: None   Collection Time: 07/14/16 11:46 AM  Result Value Ref Range   Glucose-Capillary 95 65 - 99 mg/dL     General: No acute distress. Vital signs reviewed. Psych:  Slowed Heart: RRR. Lungs: CTA B Abdomen: Positive bowel sounds, soft  Skin: No evidence of breakdown, Intact Neurologic: Alert and oriented x3 cues for date of month. Still with delays in expressive language Motor: 5/5 in left deltoid, bicep, tricep, grip, hip flexor, knee extensors, ankle dorsiflexor and plantar flexor 4-/5 minus in the right deltoid, bicep, tricep, finger flexors, hip flexor knee extensor, ankle dorsiflexor with apraxia Musculoskeletal: No edema. No tenderness  Assessment/Plan: 1. Functional deficits secondary to left ACA infarct with right hemiparesis which require 3+ hours per day of interdisciplinary therapy in a comprehensive inpatient rehab setting. Physiatrist is providing close team supervision and 24 hour management of active medical problems listed below. Physiatrist and rehab team continue to assess barriers to discharge/monitor patient progress toward functional and medical goals. FIM: Function - Bathing Position: Shower Body parts bathed by patient: Chest, Abdomen, Front perineal area, Buttocks, Right upper leg, Left upper leg, Right lower leg, Left lower leg, Back, Right arm, Left arm Body parts bathed by helper: Left arm Assist Level: Supervision or verbal cues  Function- Upper Body Dressing/Undressing What is the patient wearing?: Pull over shirt/dress Pull over shirt/dress - Perfomed by patient: Thread/unthread right sleeve, Thread/unthread left sleeve, Put head  through opening, Pull shirt over trunk Pull over shirt/dress - Perfomed by helper: Thread/unthread right sleeve, Thread/unthread left sleeve, Put head through opening, Pull shirt over trunk Assist Level: Supervision or verbal cues Function - Lower Body Dressing/Undressing What is the patient wearing?: Shoes, AFO, Pants, Maryln Manuel, Underwear Position: Bed Underwear - Performed by patient: Thread/unthread right underwear leg, Thread/unthread left underwear leg, Pull underwear up/down Underwear -  Performed by helper: Thread/unthread right underwear leg, Thread/unthread left underwear leg, Pull underwear up/down Pants- Performed by patient: Thread/unthread right pants leg, Thread/unthread left pants leg, Pull pants up/down Pants- Performed by helper: Thread/unthread right pants leg, Thread/unthread left pants leg, Pull pants up/down Non-skid slipper socks- Performed by patient: Don/doff left sock, Don/doff right sock Non-skid slipper socks- Performed by helper: Don/doff right sock Shoes - Performed by patient: Don/doff right shoe, Don/doff left shoe AFO - Performed by patient: Don/doff right AFO TED Hose - Performed by helper: Don/doff right TED hose, Don/doff left TED hose Assist for footwear: Supervision/touching assist Assist for lower body dressing: Touching or steadying assistance (Pt > 75%)  Function - Toileting Toileting activity did not occur: No continent bowel/bladder event Toileting steps completed by patient: Performs perineal hygiene Toileting steps completed by helper: Adjust clothing prior to toileting, Adjust clothing after toileting Toileting Assistive Devices: Grab bar or rail Assist level: Touching or steadying assistance (Pt.75%)  Function - Air cabin crew transfer activity did not occur: Refused Toilet transfer assistive device: Grab bar Assist level to toilet: Touching or steadying assistance (Pt > 75%) Assist level from toilet: Touching or steadying assistance (Pt > 75%)  Function - Chair/bed transfer Chair/bed transfer method: Stand pivot Chair/bed transfer assist level: Supervision or verbal cues Chair/bed transfer assistive device: Cane Chair/bed transfer details: Verbal cues for technique, Verbal cues for safe use of DME/AE, Manual facilitation for weight shifting, Verbal cues for sequencing  Function - Locomotion: Wheelchair Will patient use wheelchair at discharge?: No Type: Manual Max wheelchair distance: 181ft  Assist Level: Supervision  or verbal cues Wheel 50 feet with 2 turns activity did not occur: Safety/medical concerns Assist Level: Supervision or verbal cues Wheel 150 feet activity did not occur: Safety/medical concerns Assist Level: Supervision or verbal cues Turns around,maneuvers to table,bed, and toilet,negotiates 3% grade,maneuvers on rugs and over doorsills: No Function - Locomotion: Ambulation Assistive device: Cane-quad Max distance: 150' Assist level: Touching or steadying assistance (Pt > 75%) Assist level: Supervision or verbal cues Walk 50 feet with 2 turns activity did not occur: Safety/medical concerns Assist level: Touching or steadying assistance (Pt > 75%) Walk 150 feet activity did not occur: Safety/medical concerns Assist level: Touching or steadying assistance (Pt > 75%) Walk 10 feet on uneven surfaces activity did not occur: Safety/medical concerns Assist level: Touching or steadying assistance (Pt > 75%)  Function - Comprehension Comprehension: Auditory Comprehension assist level: Follows basic conversation/direction with no assist  Function - Expression Expression: Verbal Expression assist level: Expresses basic needs/ideas: With no assist  Function - Social Interaction Social Interaction assist level: Interacts appropriately 75 - 89% of the time - Needs redirection for appropriate language or to initiate interaction.  Function - Problem Solving Problem solving assist level: Solves basic 75 - 89% of the time/requires cueing 10 - 24% of the time  Function - Memory Memory assist level: Recognizes or recalls 75 - 89% of the time/requires cueing 10 - 24% of the time Patient normally able to recall (first 3 days only): Staff names and faces, That he or she is  in a hospital, Current season, Location of own room  Medical Problem List and Plan: 1. 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 DVT Prophylaxis/Anticoagulation: Subcutaneous Lovenox. Monitor platelet counts and any signs of bleeding 3. Pain Management: Tylenol as needed 4. Mood: Zoloft 100 mg daily 5. Neuropsych: This patient iscapable of making decisions on hisown behalf. 6. Skin/Wound Care: Routine skin checks 7. Fluids/Electrolytes/Nutrition: Routine I&Os 8.Diabetes mellitus of peripheral neuropathy. Hemoglobin A1c 6.9.Levemir 30 units daily. Check blood sugars before meals and at bedtime. Diabetic teaching   added metformin 500 mg 2/5 with breakfast, increased to 1000mg , reduced Levemir 27 units  Slightly labile, but overall improved control 2/12 9.CRI stage II.   Labs--recheck wednesday 10.Hypertension. Patient on Coreg 25 mg twice a day, HCTZ 25 mg daily, lisinopril 40 mg daily, Aldactone 50 mg daily, Norvasc 10 mg daily, Revatio 60 mg daily prior to admission.   Resumed amlodipine 10mg   HCTZ restarted 2/10, changed to chlorthalidone on 2/11  -borderline control---monitor 11.Hyperlipidemia. Lipitor 12.Question medical compliance. Provide education 13. Leukocytosis: 12.5 on 2/5  Labs pending--not done--order for tomorrow 14. Hypoalbuminemia. Moderate, pro-stat 15. Malodorous urine/incontinence: pending ucx  LOS (Days) 11 A FACE TO FACE EVALUATION WAS PERFORMED  [name] 07/15/2016, 9:08 AM

## 2016-07-15 NOTE — Progress Notes (Signed)
Speech Language Pathology Daily Session Note  Patient Details  Name: [name] MRN: [id number] Date of Birth: April 21, 1951  Today's Date: 07/15/2016 SLP Individual Time: 1300-1330 SLP Individual Time Calculation (min): 30 min  Short Term Goals: Week 2: SLP Short Term Goal 1 (Week 2): Patient will request help as needed for safety with completion of basic self-care tasks with supervision question cues.  SLP Short Term Goal 2 (Week 2): Patient will self-monitor and correct errors during mildly complex problem solving tasks with supervision question cues. SLP Short Term Goal 3 (Week 2): Patient will produce final sounds of words during sentence level verbal expression tasks with Mod assist mulimodal cues. SLP Short Term Goal 4 (Week 2): Patient will produce /l/ in all positions of words with min assist verbal and visual cues to elevate tongue.  SLP Short Term Goal 5 (Week 2): Patient will utilize lingual sweep to manage right sided buccal pocketing Mod I.   Skilled Therapeutic Interventions: Skilled treatment session focused on cognition goals. SLP facilitated session by providing Mod A verbal cues for semi-complex medication management tasks. Pt required Min A verbal cues for self-monitoring tasks and Min A for self-correcting. Pt was returned to room, left upright in wheelchair with all needs within reach. Continue current plan of care.      Function:  Eating Eating   Modified Consistency Diet: Yes Eating Assist Level: Set up assist for;Supervision or verbal cues   Eating Set Up Assist For: Opening containers       Cognition Comprehension Comprehension assist level: Follows basic conversation/direction with no assist  Expression   Expression assist level: Expresses basic needs/ideas: With no assist  Social Interaction Social Interaction assist level: Interacts appropriately with others with medication or extra time (anti-anxiety, antidepressant).  Problem Solving Problem solving  assist level: Solves basic 90% of the time/requires cueing < 10% of the time  Memory Memory assist level: Recognizes or recalls 90% of the time/requires cueing < 10% of the time    Pain Pain Assessment Pain Assessment: No/denies pain  Therapy/Group: Individual Therapy  Briseida Gittings 07/15/2016, 2:12 PM

## 2016-07-15 NOTE — Progress Notes (Signed)
Occupational Therapy Session Note  Patient Details  Name: [name] MRN: [id number] Date of Birth: 1951-02-05  Today's Date: 07/15/2016 OT Individual Time: [id number] OT Individual Time Calculation (min): 40 min    Skilled Therapeutic Interventions/Progress Updates:    Pt worked on Brewing technologist for the LUE during session along with NMES to the digit extensors.  Pt tolerated 15 mins of stimulation 10 seconds on 10 seconds off with pulse width at 300 and pulse rate at 35.  Intensity set on level 6.  While stimulation was active had pt focus on elbow extension and simulated reaching down below knee level.  Noted increased flexor tone in the elbow and digit flexors during reaching and when pt was resting hand as well.  Finished session with return to the room with pt's ex-wife present to visit.  Pt left in wheelchair with call button and phone in reach and safety belt in place.    Therapy Documentation Precautions:  Precautions Precautions: Fall Precaution Comments: Rt hemiparesis Restrictions Weight Bearing Restrictions: No  Pain: Pain Assessment Pain Assessment: No/denies pain ADL: See Function Navigator for Current Functional Status.   Therapy/Group: Individual Therapy  Marcella Charlson OTR/L 07/15/2016, 4:06 PM

## 2016-07-15 NOTE — Progress Notes (Addendum)
Social Work Patient ID: [name], male   DOB: [birth year], 66 y.o.   MRN: [id number]  Met with pt and ex-wife-Brenda to discuss her concerns. 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 She will need five days to do this, so she wants him to stay here another five days. Discussed if pt meets his goals he can not stay here and if MD feels he is medically stable for discharge he Will discharge him. Pt's option is to go to a NH and then from there go home. Pt has been to a NH before and stayed for one year, he doesn't want to go back to one. Asked Hassan Rowan and their children to see what can be done By Sat when he is going to be discharged. She feels they need longer. Pt to think about going to a NH and we will discuss tomorrow. Will work on the discharge plan. Discussed having them apply for Medicaid for him and then can Look into Lif[employer] or Life [employer].

## 2016-07-15 NOTE — Progress Notes (Signed)
Speech Language Pathology Daily Session Note  Patient Details  Name: [name] MRN: [id number] Date of Birth: Mar 25, 1951  Today's Date: 07/15/2016 SLP Individual Time: 1130-1200 SLP Individual Time Calculation (min): 30 min  Short Term Goals: Week 2: SLP Short Term Goal 1 (Week 2): Patient will request help as needed for safety with completion of basic self-care tasks with supervision question cues.  SLP Short Term Goal 2 (Week 2): Patient will self-monitor and correct errors during mildly complex problem solving tasks with supervision question cues. SLP Short Term Goal 3 (Week 2): Patient will produce final sounds of words during sentence level verbal expression tasks with Mod assist mulimodal cues. SLP Short Term Goal 4 (Week 2): Patient will produce /l/ in all positions of words with min assist verbal and visual cues to elevate tongue.  SLP Short Term Goal 5 (Week 2): Patient will utilize lingual sweep to manage right sided buccal pocketing Mod I.   Skilled Therapeutic Interventions: Skilled treatment session focused on addressing cognition goals. Patient able to recall 3 medications and their function from prior to admission. SLP facilitated session by implementing an external aid of all current medication names, functions, and frequencies.  Patient Mod for recall by end of session with use of external aid. SLP also provided verbal education regarding function and recommended use of a medication box for home management.  Patient verbalized understanding; plan to load box during next visit.  Continue with current plan of care.    Function:  Eating Eating   Modified Consistency Diet: Yes Eating Assist Level: Set up assist for;Supervision or verbal cues   Eating Set Up Assist For: Opening containers       Cognition Comprehension Comprehension assist level: Follows basic conversation/direction with no assist  Expression   Expression assist level: Expresses basic needs/ideas:  With no assist  Social Interaction Social Interaction assist level: Interacts appropriately with others with medication or extra time (anti-anxiety, antidepressant).  Problem Solving Problem solving assist level: Solves basic 90% of the time/requires cueing < 10% of the time  Memory Memory assist level: Recognizes or recalls 90% of the time/requires cueing < 10% of the time    Pain Pain Assessment Pain Assessment: No/denies pain  Therapy/Group: Individual Therapy  Carmelia Roller., CCC-SLP [id number]  [place] 07/15/2016, 1:47 PM

## 2016-07-15 NOTE — Progress Notes (Signed)
Physical Therapy Note  Patient Details  Name: [name] MRN: [id number] Date of Birth: 1950/09/02 Today's Date: 07/15/2016    Time: 830-925 55 minutes  1:1 No c/o pain.  W/c mobility with mod I throughout unit, pt continues to require cues for locking brakes before standing.  Gait with [facility] in controlled environment with supervision, obstacle negotiation with close supervision for stepping over and around obstacles.  Gait on various surfaces with initial min A progressing to supervision.  Stepping up 1 step with Rt LE for strengthening with min A for balance and cues for foot placement on step.  Pt requires mod A to stand from low furniture, able to perform bed mobility with mod I in ADL apartment bed.  Pt states he wants to go home early, PT encouraged pt to stay as he has to be mod I at home.   Suraj Ramdass 07/15/2016, 9:24 AM

## 2016-07-16 ENCOUNTER — Inpatient Hospital Stay (HOSPITAL_COMMUNITY): Payer: Medicare Other | Admitting: Occupational Therapy

## 2016-07-16 ENCOUNTER — Inpatient Hospital Stay (HOSPITAL_COMMUNITY): Payer: Medicare Other

## 2016-07-16 ENCOUNTER — Inpatient Hospital Stay (HOSPITAL_COMMUNITY): Payer: Medicare Other | Admitting: Physical Therapy

## 2016-07-16 ENCOUNTER — Inpatient Hospital Stay (HOSPITAL_COMMUNITY): Payer: Medicare Other | Admitting: Speech Pathology

## 2016-07-16 LAB — GLUCOSE, CAPILLARY
GLUCOSE-CAPILLARY: 161 mg/dL — AB (ref 65–99)
Glucose-Capillary: 158 mg/dL — ABNORMAL HIGH (ref 65–99)
Glucose-Capillary: 141 mg/dL — ABNORMAL HIGH (ref 65–99)
Glucose-Capillary: 116 mg/dL — ABNORMAL HIGH (ref 65–99)

## 2016-07-16 LAB — BASIC METABOLIC PANEL
ANION GAP: 6 (ref 5–15)
BUN: 15 mg/dL (ref 6–20)
CHLORIDE: 103 mmol/L (ref 101–111)
CO2: 30 mmol/L (ref 22–32)
Calcium: 9 mg/dL (ref 8.9–10.3)
Creatinine, Ser: 1.07 mg/dL (ref 0.61–1.24)
GFR calc non Af Amer: >60 mL/min (ref >60)
Glucose, Bld: 103 mg/dL — ABNORMAL HIGH (ref 65–99)
POTASSIUM: 4.6 mmol/L (ref 3.5–5.1)
SODIUM: 139 mmol/L (ref 135–145)

## 2016-07-16 LAB — CBC
HCT: 39.6 % (ref 39.0–52.0)
HEMOGLOBIN: 12.5 g/dL — AB (ref 13.0–17.0)
MCH: 29.2 pg (ref 26.0–34.0)
MCHC: 31.6 g/dL (ref 30.0–36.0)
MCV: 92.5 fL (ref 78.0–100.0)
PLATELETS: 205 10*3/uL (ref 150–400)
RBC: 4.28 MIL/uL (ref 4.22–5.81)
RDW: 12.4 % (ref 11.5–15.5)
WBC: 11.6 10*3/uL — AB (ref 4.0–10.5)

## 2016-07-16 NOTE — Progress Notes (Signed)
 Subjective/Complaints: No new issues feeling failry well  ROS: Limited due cognitive/behavioral     Objective: Vital Signs: Blood pressure (!) 144/83, pulse 68, temperature 98.7 F (37.1 C), temperature source Oral, resp. rate 18, height 5' 7" (1.702 m), weight 109 kg (240 lb 6.4 oz), SpO2 100 %. No results found. Results for orders placed or performed during the hospital encounter of 07/04/16 (from the past 72 hour(s))  Glucose, capillary     Status: None   Collection Time: 07/13/16 11:53 AM  Result Value Ref Range   Glucose-Capillary 94 65 - 99 mg/dL  Glucose, capillary     Status: Abnormal   Collection Time: 07/13/16  4:54 PM  Result Value Ref Range   Glucose-Capillary 145 (H) 65 - 99 mg/dL  Glucose, capillary     Status: Abnormal   Collection Time: 07/13/16  8:43 PM  Result Value Ref Range   Glucose-Capillary 138 (H) 65 - 99 mg/dL  Glucose, capillary     Status: Abnormal   Collection Time: 07/14/16  6:58 AM  Result Value Ref Range   Glucose-Capillary 102 (H) 65 - 99 mg/dL   Comment 1 Notify RN   Glucose, capillary     Status: None   Collection Time: 07/14/16 11:46 AM  Result Value Ref Range   Glucose-Capillary 95 65 - 99 mg/dL  Glucose, capillary     Status: Abnormal   Collection Time: 07/14/16  5:14 PM  Result Value Ref Range   Glucose-Capillary 173 (H) 65 - 99 mg/dL  Urine culture     Status: Abnormal   Collection Time: 07/14/16  5:16 PM  Result Value Ref Range   Specimen Description URINE, RANDOM    Special Requests NONE    Culture MULTIPLE SPECIES PRESENT, SUGGEST RECOLLECTION (A)    Report Status 07/15/2016 FINAL   Glucose, capillary     Status: Abnormal   Collection Time: 07/14/16  8:35 PM  Result Value Ref Range   Glucose-Capillary 221 (H) 65 - 99 mg/dL   Comment 1 Notify RN   Glucose, capillary     Status: Abnormal   Collection Time: 07/15/16  6:49 AM  Result Value Ref Range   Glucose-Capillary 101 (H) 65 - 99 mg/dL  Glucose, capillary      Status: Abnormal   Collection Time: 07/15/16 11:08 AM  Result Value Ref Range   Glucose-Capillary 104 (H) 65 - 99 mg/dL   Comment 1 Notify RN   Basic metabolic panel     Status: Abnormal   Collection Time: 07/16/16  6:14 AM  Result Value Ref Range   Sodium 139 135 - 145 mmol/L   Potassium 4.6 3.5 - 5.1 mmol/L   Chloride 103 101 - 111 mmol/L   CO2 30 22 - 32 mmol/L   Glucose, Bld 103 (H) 65 - 99 mg/dL   BUN 15 6 - 20 mg/dL   Creatinine, Ser 1.07 0.61 - 1.24 mg/dL   Calcium 9.0 8.9 - 10.3 mg/dL   GFR calc non Af Amer >60 >60 mL/min   GFR calc Af Amer >60 >60 mL/min    Comment: (NOTE) The eGFR has been calculated using the CKD EPI equation. This calculation has not been validated in all clinical situations. eGFR's persistently <60 mL/min signify possible Chronic Kidney Disease.    Anion gap 6 5 - 15  CBC     Status: Abnormal   Collection Time: 07/16/16  6:14 AM  Result Value Ref Range   WBC 11.6 (H) 4.0 -   10.5 K/uL   RBC 4.28 4.22 - 5.81 MIL/uL   Hemoglobin 12.5 (L) 13.0 - 17.0 g/dL   HCT 39.6 39.0 - 52.0 %   MCV 92.5 78.0 - 100.0 fL   MCH 29.2 26.0 - 34.0 pg   MCHC 31.6 30.0 - 36.0 g/dL   RDW 12.4 11.5 - 15.5 %   Platelets 205 150 - 400 K/uL     General: No acute distress. Vital signs reviewed. Psych: Slowed Heart: RRR Lungs: CTA B Abdomen: Positive bowel sounds, soft  Skin: No evidence of breakdown, Intact Neurologic: Alert and oriented x3 cues for date of month. Still with delays in expressive language and processing---improved overall Motor: 5/5 in left deltoid, bicep, tricep, grip, hip flexor, knee extensors, ankle dorsiflexor and plantar flexor 4-/5 minus in the right deltoid, bicep, tricep, finger flexors, hip flexor knee extensor, ankle dorsiflexor with apraxia Musculoskeletal: No edema. No tenderness  Assessment/Plan: 1. Functional deficits secondary to left ACA infarct with right hemiparesis which require 3+ hours per day of interdisciplinary therapy in a  comprehensive inpatient rehab setting. Physiatrist is providing close team supervision and 24 hour management of active medical problems listed below. Physiatrist and rehab team continue to assess barriers to discharge/monitor patient progress toward functional and medical goals. FIM: Function - Bathing Position: Shower Body parts bathed by patient: Chest, Abdomen, Front perineal area, Buttocks, Right upper leg, Left upper leg, Right lower leg, Left lower leg, Back, Right arm, Left arm Body parts bathed by helper: Left arm Assist Level: Supervision or verbal cues  Function- Upper Body Dressing/Undressing What is the patient wearing?: Pull over shirt/dress Pull over shirt/dress - Perfomed by patient: Thread/unthread right sleeve, Thread/unthread left sleeve, Put head through opening, Pull shirt over trunk Pull over shirt/dress - Perfomed by helper: Thread/unthread right sleeve, Thread/unthread left sleeve, Put head through opening, Pull shirt over trunk Assist Level: Supervision or verbal cues Function - Lower Body Dressing/Undressing What is the patient wearing?: Shoes, AFO, Pants, Ted Hose, Underwear Position: Sitting EOB Underwear - Performed by patient: Thread/unthread right underwear leg, Thread/unthread left underwear leg, Pull underwear up/down Underwear - Performed by helper: Thread/unthread right underwear leg, Thread/unthread left underwear leg, Pull underwear up/down Pants- Performed by patient: Thread/unthread right pants leg, Thread/unthread left pants leg, Pull pants up/down Pants- Performed by helper: Thread/unthread right pants leg, Thread/unthread left pants leg, Pull pants up/down Non-skid slipper socks- Performed by patient: Don/doff left sock, Don/doff right sock Non-skid slipper socks- Performed by helper: Don/doff right sock Shoes - Performed by patient: Don/doff right shoe, Don/doff left shoe, Fasten right, Fasten left AFO - Performed by patient: Don/doff right AFO TED  Hose - Performed by helper: Don/doff left TED hose, Don/doff right TED hose Assist for footwear: Supervision/touching assist Assist for lower body dressing: Touching or steadying assistance (Pt > 75%)  Function - Toileting Toileting activity did not occur: No continent bowel/bladder event Toileting steps completed by patient: Adjust clothing prior to toileting, Performs perineal hygiene, Adjust clothing after toileting Toileting steps completed by helper: Adjust clothing prior to toileting, Adjust clothing after toileting Toileting Assistive Devices: Grab bar or rail Assist level: Touching or steadying assistance (Pt.75%)  Function - Toilet Transfers Toilet transfer activity did not occur: Refused Toilet transfer assistive device: Grab bar Assist level to toilet: Touching or steadying assistance (Pt > 75%) Assist level from toilet: Touching or steadying assistance (Pt > 75%)  Function - Chair/bed transfer Chair/bed transfer method: Stand pivot Chair/bed transfer assist level: Supervision or verbal cues Chair/bed   transfer assistive device: Cane Chair/bed transfer details: Verbal cues for technique, Verbal cues for safe use of DME/AE, Manual facilitation for weight shifting, Verbal cues for sequencing  Function - Locomotion: Wheelchair Will patient use wheelchair at discharge?: No Type: Manual Max wheelchair distance: 150ft  Assist Level: Supervision or verbal cues Wheel 50 feet with 2 turns activity did not occur: Safety/medical concerns Assist Level: Supervision or verbal cues Wheel 150 feet activity did not occur: Safety/medical concerns Assist Level: Supervision or verbal cues Turns around,maneuvers to table,bed, and toilet,negotiates 3% grade,maneuvers on rugs and over doorsills: No Function - Locomotion: Ambulation Assistive device: Cane-quad Max distance: 150' Assist level: Touching or steadying assistance (Pt > 75%) Assist level: Supervision or verbal cues Walk 50 feet  with 2 turns activity did not occur: Safety/medical concerns Assist level: Touching or steadying assistance (Pt > 75%) Walk 150 feet activity did not occur: Safety/medical concerns Assist level: Touching or steadying assistance (Pt > 75%) Walk 10 feet on uneven surfaces activity did not occur: Safety/medical concerns Assist level: Touching or steadying assistance (Pt > 75%)  Function - Comprehension Comprehension: Auditory Comprehension assist level: Follows basic conversation/direction with extra time/assistive device  Function - Expression Expression: Verbal Expression assist level: Expresses basic 75 - 89% of the time/requires cueing 10 - 24% of the time. Needs helper to occlude trach/needs to repeat words.  Function - Social Interaction Social Interaction assist level: Interacts appropriately with others with medication or extra time (anti-anxiety, antidepressant).  Function - Problem Solving Problem solving assist level: Solves basic 75 - 89% of the time/requires cueing 10 - 24% of the time  Function - Memory Memory assist level: Recognizes or recalls 75 - 89% of the time/requires cueing 10 - 24% of the time Patient normally able to recall (first 3 days only): Staff names and faces, That he or she is in a hospital, Current season, Location of own room  Medical Problem List and Plan: 1. 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 DVT Prophylaxis/Anticoagulation: Subcutaneous Lovenox. Monitor platelet counts and any signs of bleeding 3. Pain Management: Tylenol as needed 4. Mood: Zoloft 100 mg daily 5. Neuropsych: This patient iscapable of making decisions on hisown behalf. 6. Skin/Wound Care: Routine skin checks 7. Fluids/Electrolytes/Nutrition:  Routine I&Os 8.Diabetes mellitus of peripheral neuropathy. Hemoglobin A1c 6.9.Levemir 30 units daily. Check blood sugars before meals and at bedtime. Diabetic teaching   added metformin 500 mg 2/5 with breakfast, increased to 1000mg, reduced Levemir 27 units  Some elevation last night---will not make changes today---continue to boserve 9.CRI stage II.   Labs--recheck wednesday 10.Hypertension. Patient on Coreg 25 mg twice a day, HCTZ 25 mg daily, lisinopril 40 mg daily, Aldactone 50 mg daily, Norvasc 10 mg daily, Revatio 60 mg daily prior to admission.   Resumed amlodipine 10mg  HCTZ restarted 2/10, changed to chlorthalidone on 2/11  -improved control overall 11.Hyperlipidemia. Lipitor 12.Question medical compliance. Provide education 13. Leukocytosis: down to 11.6 today  -no signs of infection 14. Hypoalbuminemia. Moderate, pro-stat 15. Malodorous urine/incontinence: pending ucx  LOS (Days) 12 A FACE TO FACE EVALUATION WAS PERFORMED  , T 07/16/2016, 9:20 AM   

## 2016-07-16 NOTE — Progress Notes (Signed)
Physical Therapy Note  Patient Details  Name: ERFAN MAHESHWARI MRN: [id number] Date of Birth: 1950/10/17 Today's Date: 07/16/2016  1300-1330, 30 min indivuidual tx Pain: no c/o  W/c propulsion on unit using hemi method.  Gait on level tile with supervision, WBQC, RAFO.  Advanced gait training stepping over obstacles (canes) with min guard assist, mod cues for technique, with problems clearing R foot 1/6 trials.  neuromuscular re-education via forced use, demo for trunk shortening/lenthening/rotating during reaching out of BOS to L in high supported sitting. Pt left resting in w/c with all needs within reach.  See function navigator for current status.  Parv Manthey 07/16/2016, 2:01 PM

## 2016-07-16 NOTE — Progress Notes (Signed)
Social Work Patient ID: [name], male   DOB: 1950/07/18, 66 y.o.   MRN: [id number]  Met with pt to discuss team conference goals and his plan. Pt has decided to go home and feels he is doing better than he was prior to Admission here from stroke. He understands his ex-wife's concerns but is going home anyway. Have set up home health follow up and have asked about mobile meals,which he declined. Will work on this plan for Sat.

## 2016-07-16 NOTE — Progress Notes (Signed)
Social Work Elease Hashimoto, LCSW Social Worker Signed   Patient Care Conference Date of Service: 07/16/2016  3:20 PM      Hide copied text Hover for attribution information Inpatient RehabilitationTeam Conference and Plan of Care Update Date: 07/16/2016   Time: 10:40 AM      Patient Name: [name]      Medical Record Number: [id number]  Date of Birth: 1950-11-17 Sex: Male         Room/Bed: 4W10C/4W10C-01 Payor Info: Payor: MEDICARE / Plan: MEDICARE PART A AND B / Product Type: *No Product type* /     Admitting Diagnosis: l CVA  Admit Date/Time:  07/04/2016  4:55 PM Admission Comments: No comment available    Primary Diagnosis:  Cerebrovascular small vessel disease Principal Problem: Cerebrovascular small vessel disease       Patient Active Problem List    Diagnosis Date Noted  . Stage 2 chronic kidney disease    . Leukocytosis    . Type 2 diabetes mellitus with peripheral neuropathy (HCC)    . Benign essential HTN    . Spastic hemiparesis ([place])    . Cerebrovascular small vessel disease 07/04/2016  . Aphasia due to recent cerebral infarction 07/04/2016  . Cerebrovascular accident (CVA) ([place])    . Altered mental status 06/30/2016  . Fall 06/30/2016  . Acute encephalopathy 06/30/2016  . Sepsis ([place]) 06/30/2016  . Diarrhea 06/30/2016  . Acute renal failure superimposed on stage 2 chronic kidney disease ([place]) 06/30/2016  . Hemiparesis affecting right side as late effect of stroke ([place]) 10/23/2015  . PAD (peripheral artery disease) ([place]) 10/20/2011  . Hyperlipidemia 02/21/2010  . CEREBROVASCULAR ACCIDENT, HX OF 12/22/2008  . Diabetes mellitus, type II ([place]) 07/30/2006  . OBESITY, NOS 07/30/2006  . Essential hypertension, benign 07/30/2006      Expected Discharge Date: Expected Discharge Date: 07/19/16   Team Members Present: Physician leading conference: Dr. Delice Lesch Social Worker Present: Ovidio Kin, LCSW Nurse Present: Heather Roberts, RN PT Present: Georjean Mode, PT;Austin Berline Lopes, Dustin Folks, PT OT Present: Clyda Greener, OT SLP Present: Gunnar Fusi, SLP PPS Coordinator present : Daiva Nakayama, RN, CRRN       Current Status/Progress Goal Weekly Team Focus  Medical     improved communication skills, bp under better control as is diabetes. monitoring electrolytes and renal status as well  stabilize medically for discharge  electrolyte mgt, renal function, post-op anemia mgt   Bowel/Bladder     Continent B/B LBM 2/13  Remain continent   monitor   Swallow/Nutrition/ Hydration     Min A  Mod I with use of comepnsatory strategies   addressing use of swallow compenstaroy strategies with least restrictive diet   ADL's     Supervision for UB bathign and dressing, min guard for LB bathing with occasional min assist for dressing to donn right AFO.  Close supervision for stand pivot transfers with use of the quad cane to the toilet or shower seat.    supervison for bathing, dressing with modified independence for most other things.   selfcare retraining, balance retraining, neuromuscular re-education, pt/family education, NMES   Mobility     supervision for transfers and gait, mod I bed mobility  Mod I for gait and transfer with LRAD at househould level.   safety awareness, balance, gait   Communication     Mod A verbal  Supervision downgraded to Min A 2/14  increasing speech intelligibility at the sentence   Safety/Cognition/ Behavioral Observations  Mod A verbal cues   Supervision downgraded to Min A 2/14  problem solving basic daily situations   Pain     denies pain   <3 out of 10  assess q shift and PRN   Skin     abrasions to BLE  No skin breakdown  monitor     *See Care Plan and progress notes for long and short-term goals.   Barriers to Discharge: dense right hemiparesis,     Possible Resolutions to Barriers:  continued family education and adaptive equipment training     Discharge Planning/Teaching Needs:  Ex-wife came by  yesterday to discuss concerns regarding pt going home this Sat, she wants him to go to a NH then home. 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 Pt to decide today about plan.      Team Discussion: Reaching goals of supervision-mod/i level. Safety issues with ambulation and balance issues. MD adjusting diabetes and HTN meds-blood pressure is till elevated. Downgraded communication goals. Checking UA today. DC quick release belt. Team recommends 24 hr supervision but will not have that at home and pt is still planning to go home. Aware of NH option and refuses this has been to one before for one year.  Revisions to Treatment Plan:  DC 2/17.    Continued Need for Acute Rehabilitation Level of Care: The patient requires daily medical management by a physician with specialized training in physical medicine and rehabilitation for the following conditions: Daily direction of a multidisciplinary physical rehabilitation program to ensure safe treatment while eliciting the highest outcome that is of practical value to the patient.: Yes Daily medical management of patient stability for increased activity during participation in an intensive rehabilitation regime.: Yes Daily analysis of laboratory values and/or radiology reports with any subsequent need for medication adjustment of medical intervention for : Neurological problems;Blood pressure problems;Renal problems;Diabetes problems   Elease Hashimoto 07/16/2016, 3:20 PM      Elease Hashimoto, LCSW Social Worker Signed   Patient Care Conference Date of Service: 07/09/2016  1:35 PM      Hide copied text Hover for attribution information Inpatient RehabilitationTeam Conference and Plan of Care Update Date: 07/09/2016   Time: 10:48 AM      Patient Name: [name]      Medical Record Number: [id number]  Date of Birth: 1951/01/21 Sex: Male         Room/Bed: 4W10C/4W10C-01 Payor Info: Payor: MEDICARE / Plan: MEDICARE PART A AND B /  Product Type: *No Product type* /     Admitting Diagnosis: l CVA  Admit Date/Time:  07/04/2016  4:55 PM Admission Comments: No comment available    Primary Diagnosis:  Cerebrovascular small vessel disease Principal Problem: Cerebrovascular small vessel disease       Patient Active Problem List    Diagnosis Date Noted  . Cerebrovascular small vessel disease 07/04/2016  . Aphasia due to recent cerebral infarction 07/04/2016  . Cerebrovascular accident (CVA) ([place])    . Altered mental status 06/30/2016  . Fall 06/30/2016  . Acute encephalopathy 06/30/2016  . Sepsis ([place]) 06/30/2016  . Diarrhea 06/30/2016  . Acute renal failure superimposed on stage 2 chronic kidney disease ([place]) 06/30/2016  . Hemiparesis affecting right side as late effect of stroke ([place]) 10/23/2015  . PAD (peripheral artery disease) ([place]) 10/20/2011  . Hyperlipidemia 02/21/2010  . CEREBROVASCULAR ACCIDENT, HX OF 12/22/2008  . Diabetes mellitus, type II ([place]) 07/30/2006  . OBESITY,  NOS 07/30/2006  . Essential hypertension, benign 07/30/2006      Expected Discharge Date: Expected Discharge Date: 07/19/16   Team Members Present: Physician leading conference: Dr. Alysia Penna Social Worker Present: Ovidio Kin, LCSW Nurse Present: Dorthula Nettles, RN PT Present: Barrie Folk, PT OT Present: Clyda Greener, OT SLP Present: Gunnar Fusi, SLP PPS Coordinator present : Daiva Nakayama, RN, CRRN       Current Status/Progress Goal Weekly Team Focus  Medical     Diabetic management, ongoing, started metformin. Blood pressure still uncontrolled, increasing amlodipine  Achieve goals for blood sugar and blood sugar pressure management  Medication adjustments   Bowel/Bladder          cont B & B     Swallow/Nutrition/ Hydration               ADL's     min assist UB bathing with supervision for dressing, min assist for LB bathing and dressing sit to stand approaching supervision.  transfers to the toilet and shower  are min guard assist with use of his quad cane.  Increased flexor tone in the right elbow, and digit flexors.    Modified independent mostly with supervision  selfcare retraining, balance retraining, neuromuscular retraining, pt/family education, electrical stimulation   Mobility     Min assist overall for transfers and gait with LBQC. Supervision assist for bed mobility with use of bed features.   Mod I for gait and transfer with LRAD at househould level.   improved independence with Balance, transfers, gait, and bed mobility    Communication               Safety/Cognition/ Behavioral Observations             Pain          no issues     Skin        Monitoring skin       *See Care Plan and progress notes for long and short-term goals.   Barriers to Discharge: Still requiring heavy physical assistance, see above     Possible Resolutions to Barriers:  Continue neuromuscular reeducation, see above     Discharge Planning/Teaching Needs:  Home with intermittent assist form daughter's-they were providing home management prior to admission.       Team Discussion:  Goals supervision to mod/i level. Balance issues and safety risk to fall at home. Berg 27/56. E-stim on arm for movement. MD may botox his arm for mobility. Pt is working hard in therapies and plans to reach his goals while her.MD adjusting medicines.  Revisions to Treatment Plan:  DC 2/17    Continued Need for Acute Rehabilitation Level of Care: The patient requires daily medical management by a physician with specialized training in physical medicine and rehabilitation for the following conditions: Daily direction of a multidisciplinary physical rehabilitation program to ensure safe treatment while eliciting the highest outcome that is of practical value to the patient.: Yes Daily medical management of patient stability for increased activity during participation in an intensive rehabilitation regime.: Yes Daily analysis of  laboratory values and/or radiology reports with any subsequent need for medication adjustment of medical intervention for : Neurological problems;Diabetes problems;Blood pressure problems   Elease Hashimoto 07/10/2016, 8:39 AM       Patient ID: Conni Elliot, male   DOB: [birth year], 66 y.o.   MRN: [id number]

## 2016-07-16 NOTE — Patient Care Conference (Signed)
Inpatient RehabilitationTeam Conference and Plan of Care Update Date: 07/16/2016   Time: 10:40 AM    Patient Name: [name]      Medical Record Number: [id number]  Date of Birth: 1950-08-19 Sex: Male         Room/Bed: 4W10C/4W10C-01 Payor Info: Payor: MEDICARE / Plan: MEDICARE PART A AND B / Product Type: *No Product type* /    Admitting Diagnosis: l CVA  Admit Date/Time:  07/04/2016  4:55 PM Admission Comments: No comment available   Primary Diagnosis:  Cerebrovascular small vessel disease Principal Problem: Cerebrovascular small vessel disease  Patient Active Problem List   Diagnosis Date Noted  . Stage 2 chronic kidney disease   . Leukocytosis   . Type 2 diabetes mellitus with peripheral neuropathy (HCC)   . Benign essential HTN   . Spastic hemiparesis ([place])   . Cerebrovascular small vessel disease 07/04/2016  . Aphasia due to recent cerebral infarction 07/04/2016  . Cerebrovascular accident (CVA) ([place])   . Altered mental status 06/30/2016  . Fall 06/30/2016  . Acute encephalopathy 06/30/2016  . Sepsis ([place]) 06/30/2016  . Diarrhea 06/30/2016  . Acute renal failure superimposed on stage 2 chronic kidney disease ([place]) 06/30/2016  . Hemiparesis affecting right side as late effect of stroke ([place]) 10/23/2015  . PAD (peripheral artery disease) ([place]) 10/20/2011  . Hyperlipidemia 02/21/2010  . CEREBROVASCULAR ACCIDENT, HX OF 12/22/2008  . Diabetes mellitus, type II ([place]) 07/30/2006  . OBESITY, NOS 07/30/2006  . Essential hypertension, benign 07/30/2006    Expected Discharge Date: Expected Discharge Date: 07/19/16  Team Members Present: Physician leading conference: Dr. Delice Lesch Social Worker Present: Ovidio Kin, LCSW Nurse Present: Heather Roberts, RN PT Present: Georjean Mode, PT;Austin Berline Lopes, Dustin Folks, PT OT Present: Clyda Greener, OT SLP Present: Gunnar Fusi, SLP PPS Coordinator present : Daiva Nakayama, RN, CRRN     Current Status/Progress Goal Weekly  Team Focus  Medical   improved communication skills, bp under better control as is diabetes. monitoring electrolytes and renal status as well  stabilize medically for discharge  electrolyte mgt, renal function, post-op anemia mgt   Bowel/Bladder   Continent B/B LBM 2/13  Remain continent   monitor   Swallow/Nutrition/ Hydration   Min A  Mod I with use of comepnsatory strategies   addressing use of swallow compenstaroy strategies with least restrictive diet   ADL's   Supervision for UB bathign and dressing, min guard for LB bathing with occasional min assist for dressing to donn right AFO.  Close supervision for stand pivot transfers with use of the quad cane to the toilet or shower seat.    supervison for bathing, dressing with modified independence for most other things.   selfcare retraining, balance retraining, neuromuscular re-education, pt/family education, NMES   Mobility   supervision for transfers and gait, mod I bed mobility  Mod I for gait and transfer with LRAD at househould level.   safety awareness, balance, gait   Communication   Mod A verbal  Supervision downgraded to Min A 2/14  increasing speech intelligibility at the sentence   Safety/Cognition/ Behavioral Observations  Mod A verbal cues   Supervision downgraded to [place] A 2/14  problem solving basic daily situations   Pain   denies pain   <3 out of 10  assess q shift and PRN   Skin   abrasions to BLE  No skin breakdown  monitor      *See Care Plan and progress notes for long and  short-term goals.  Barriers to Discharge: dense right hemiparesis,    Possible Resolutions to Barriers:  continued family education and adaptive equipment training    Discharge Planning/Teaching Needs:  Ex-wife came by yesterday to discuss concerns regarding pt going home this Sat, she wants him to go to a NH then home. 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 Pt to decide today about plan.      Team Discussion: Reaching  goals of supervision-mod/i level. Safety issues with ambulation and balance issues. MD adjusting diabetes and HTN meds-blood pressure is till elevated. Downgraded communication goals. Checking UA today. DC quick release belt. Team recommends 24 hr supervision but will not have that at home and pt is still planning to go home. Aware of NH option and refuses this has been to one before for one year.  Revisions to Treatment Plan:  DC 2/17.   Continued Need for Acute Rehabilitation Level of Care: The patient requires daily medical management by a physician with specialized training in physical medicine and rehabilitation for the following conditions: Daily direction of a multidisciplinary physical rehabilitation program to ensure safe treatment while eliciting the highest outcome that is of practical value to the patient.: Yes Daily medical management of patient stability for increased activity during participation in an intensive rehabilitation regime.: Yes Daily analysis of laboratory values and/or radiology reports with any subsequent need for medication adjustment of medical intervention for : Neurological problems;Blood pressure problems;Renal problems;Diabetes problems  Elease Hashimoto 07/16/2016, 3:20 PM

## 2016-07-16 NOTE — Progress Notes (Signed)
Physical Therapy Session Note  Patient Details  Name: KENSON GROH MRN: [id number] Date of Birth: [birth year]  Today's Date: 07/16/2016 PT Individual Time: 1002-1030 PT Individual Time Calculation (min): 28 min   Short Term Goals: Week 2:  PT Short Term Goal 1 (Week 2): = LTGs due to LOS  Skilled Therapeutic Interventions/Progress Updates:   Pt received sitting in WC and agreeable to PT.   Gait x 168f with distant supervision assist from PT with use of LBQC.     Gait training over ramp and Uneven mulched surface with QC; supervision assist from PT on ramp and min assist on uneven surface. Min cues for AD management as well as proper step to gait pattern  For safety.   Patient performed sit<>stand and stand pivot transfers x 3 each throughout treatment with distant supervision assist from PT for safety.   Pt propelled WC back to room with hemi technique without cues or assist from PT.   [facility] too [facility] and all needs met.        Therapy Documentation Precautions:  Precautions Precautions: Fall Precaution Comments: Rt hemiparesis Restrictions Weight Bearing Restrictions: No General:   Vital Signs: Therapy Vitals Temp: 98.4 F (36.9 C) Temp Source: Oral Pulse Rate: 68 Resp: 17 BP: (!) 155/50 Patient Position (if appropriate): Sitting Pain: Pain Assessment Pain Assessment: No/denies pain   See Function Navigator for Current Functional Status.   Therapy/Group: Individual Therapy  ALorie Phenix2/14/2018, 2:07 PM

## 2016-07-16 NOTE — Progress Notes (Signed)
Speech Language Pathology Daily Session Note  Patient Details  Name: KANDON PENNING MRN: [id number] Date of Birth: September 08, 1950  Today's Date: 07/16/2016 SLP Individual Time: 1105-1200 SLP Individual Time Calculation (min): 55 min  Short Term Goals: Week 2: SLP Short Term Goal 1 (Week 2): Patient will request help as needed for safety with completion of basic self-care tasks with supervision question cues.  SLP Short Term Goal 2 (Week 2): Patient will self-monitor and correct errors during mildly complex problem solving tasks with supervision question cues. SLP Short Term Goal 3 (Week 2): Patient will produce final sounds of words during sentence level verbal expression tasks with Mod assist mulimodal cues. SLP Short Term Goal 4 (Week 2): Patient will produce /l/ in all positions of words with min assist verbal and visual cues to elevate tongue.  SLP Short Term Goal 5 (Week 2): Patient will utilize lingual sweep to manage right sided buccal pocketing Mod I.   Skilled Therapeutic Interventions: Skilled treatment session focused on addressing cognition goals. SLP informed patient that goals were downgraded to [place] assist given severity of ongoing cognitive-linguistic deficits.  Patient participated in a discussion regarding discharge planning he said that his family was checking into a life alert button for him when he gets home since he will not have 24/7 Supervision as recommended.  SLP also educated on recommendations for brushing teeth following meal to better manage oral, buccal residue.  Use of this strategy should make him Mod I and safe for management at home.  Patient completed task of identifying unsafe activities in photos and providing a safer solution considering his own deficits, which he did with Supervision level question cues.  Patient over estimates his abilities while standing at times and required question cues to provide solutions that include with walker.  Continue with current  plan of care.    Function:  Eating Eating   Modified Consistency Diet: Yes Eating Assist Level: More than reasonable amount of time;Set up assist for   Eating Set Up Assist For: Opening containers       Cognition Comprehension Comprehension assist level: Follows basic conversation/direction with extra time/assistive device  Expression   Expression assist level: Expresses basic 75 - 89% of the time/requires cueing 10 - 24% of the time. Needs helper to occlude trach/needs to repeat words.  Social Interaction Social Interaction assist level: Interacts appropriately with others with medication or extra time (anti-anxiety, antidepressant).  Problem Solving Problem solving assist level: Solves basic 90% of the time/requires cueing < 10% of the time  Memory Memory assist level: Recognizes or recalls 75 - 89% of the time/requires cueing 10 - 24% of the time    Pain Pain Assessment Pain Assessment: No/denies pain Pain Score: 0-No pain  Therapy/Group: Individual Therapy  Carmelia Roller., CCC-SLP [id number]  [place] 07/16/2016, 11:38 AM

## 2016-07-16 NOTE — Progress Notes (Signed)
Occupational Therapy Session Note  Patient Details  Name: [name] MRN: [id number] Date of Birth: [birth year]  Today's Date: 07/16/2016 OT Individual Time: [id number] OT Individual Time Calculation (min): 75 min    Short Term Goals: Week 2:  OT Short Term Goal 1 (Week 2): Pt will continue working toward established LTGs set at overall modified independent to supervision level.    Skilled Therapeutic Interventions/Progress Updates:    Pt completed shower, grooming, and dressing to start session.  Supervision for transfer to the EOB and then to ambulate over to his dresser to gather clothing to donn after shower.  He utilize his quad cane for mobility to complete this as well as for mobility to and from the shower bench.  Supervision for all bathing sit to stand with use of the grab bar for initial support when standing and integration of a LH sponge for washing his feet, back, and UEs.  He transferred out to the bed to complete dressing supine to sit.  Pt transitions back to supine for threading the RLE in his underpants, pants, and right shoe with AFO.  He completed all aspects with supervision but did need one instructional cue to scoot further onto the bed when transitioning from supine to sit and then attempting to place his LLE in his underpants and pants.  Completed oral hygiene at the sink with setup only.  Last part of session therapist applied NMES to the right digit extensors for 15 mins.  Settings were 6.5 for intensity, 35 pulse rate, and 300 pulse width.  Instructed pt to assist with digit extension as stimulation was active.  No adverse reactions to stimuli noted.  Pt left in wheelchair with nursing present at end of session.  Safety belt in place and call button in reach.      Therapy Documentation Precautions:  Precautions Precautions: Fall Precaution Comments: Rt hemiparesis Restrictions Weight Bearing Restrictions: No  Pain: Pain Assessment Pain Assessment: No/denies  pain ADL: See Function Navigator for Current Functional Status.   Therapy/Group: Individual Therapy  [name] OTR/L 07/16/2016, 9:16 AM

## 2016-07-17 ENCOUNTER — Inpatient Hospital Stay (HOSPITAL_COMMUNITY): Payer: Medicare Other | Admitting: Speech Pathology

## 2016-07-17 ENCOUNTER — Inpatient Hospital Stay (HOSPITAL_COMMUNITY): Payer: Medicare Other | Admitting: Occupational Therapy

## 2016-07-17 ENCOUNTER — Inpatient Hospital Stay (HOSPITAL_COMMUNITY): Payer: Medicare Other

## 2016-07-17 ENCOUNTER — Inpatient Hospital Stay (HOSPITAL_COMMUNITY): Payer: Medicare Other | Admitting: Physical Therapy

## 2016-07-17 LAB — GLUCOSE, CAPILLARY
GLUCOSE-CAPILLARY: 111 mg/dL — AB (ref 65–99)
GLUCOSE-CAPILLARY: 159 mg/dL — AB (ref 65–99)
Glucose-Capillary: 130 mg/dL — ABNORMAL HIGH (ref 65–99)
Glucose-Capillary: 139 mg/dL — ABNORMAL HIGH (ref 65–99)
Glucose-Capillary: 134 mg/dL — ABNORMAL HIGH (ref 65–99)

## 2016-07-17 NOTE — Progress Notes (Signed)
Physical Therapy Note  Patient Details  Name: [name] MRN: [id number] Date of Birth: Sep 05, 1950 Today's Date: 07/17/2016  1130-1200, 30 min individual tx Pain: no c/o  Pt stated he needed to use toilet.  Gait in room and BR to toilet and sink.  Gait on level tile and carpet to/from day room.  Therapeutic activity to pull out armless chair from table, sit in chair and pull up to table; pt needed explanation to perform this.  Pt retrieved pen from floor from standing with LBQC.  Pt stated he has an armchair at his kitchen table at home.  CSW stated pt will d/c tomorrow to SNF.  Pt left resting in w/c with all needs within reach, set up for lunch.     [name] 07/17/2016, 12:27 PM

## 2016-07-17 NOTE — Progress Notes (Signed)
Physical Therapy Discharge Summary  Patient Details  Name: [name] MRN: [id number] Date of Birth: January 26, 1951  Today's Date: 07/17/2016 PT Individual Time: 1430-1510 PT Individual Time Calculation (min): 40 min    Patient has met 1 of 7 long term goals due to improved activity tolerance, improved balance, improved postural control, ability to compensate for deficits and functional use of  right lower extremity.  Patient to discharge at an ambulatory level Supervision.   Patient's care partner unavailable to provide the necessary cognitive assistance at discharge.  Reasons goals not met: LOS decreased at family/pt decided pt should got to SNF; safety due to chronic cognitive deficits due to previous CVA  Recommendation:  Patient will benefit from ongoing skilled PT services in skilled nursing facility setting to continue to advance safe functional mobility, address ongoing impairments in balance, safety, activity tolerance, motor, mobility and locomtion, and minimize fall risk.  Equipment: owns double metal upright RAFO  Reasons for discharge: treatment goals met and discharge from hospital  Patient/family agrees with progress made and goals achieved: Yes  PT Discharge Gait over level tile, ramp, mulched ground, simulated car transfer, stairs.  Dynamic sitting and standing balance.  Pt provided external perturbations; he did not exhibit any balance strategies given a minimal disturbance backward, and would have fallen backwards without assistance. Pt left resting in w/c with all needs within reach.  Precautions/Restrictions Precautions Precautions: Fall Precaution Comments: Rt hemiparesis, increased tone in the right UE and LE Restrictions Weight Bearing Restrictions: No Vital Signs Therapy Vitals Temp: 98.6 F (37 C) Temp Source: Oral Pulse Rate: 71 Resp: 16 BP: (!) 136/58 Patient Position (if appropriate): Sitting Oxygen Therapy SpO2: 99 % O2 Device: Not  Delivered Pain Pain Assessment Pain Assessment: No/denies pain Vision/Perception - reading glasses; pt reports no change since recent CVA    Cognition Overall Cognitive Status: History of cognitive impairments - at baseline Arousal/Alertness: Awake/alert Orientation Level: Oriented to time;Oriented to place;Oriented to person;Oriented to situation Memory: Appears intact Memory Impairment: Decreased long term memory;Decreased recall of new information Decreased Long Term Memory: Verbal basic;Functional basic Awareness: Impaired Awareness Impairment: Anticipatory impairment Problem Solving: Impaired Problem Solving Impairment: Functional complex Executive Function:  (all impaired due to lower level cognitive deficits) Safety/Judgment: Impaired Comments: pt continues to move when has has LOB on steps; laughing about it Sensation Sensation Light Touch: Appears Intact Proprioception: Not tested (RLE) Coordination Gross Motor Movements are Fluid and Coordinated: No Fine Motor Movements are Fluid and Coordinated: No Coordination and Movement Description: pt with strong RLE adductor tone during swing phase when descending steps; unsafe.  Heel Shin Test: unable RLe Motor  Motor Motor: Hemiplegia;Abnormal tone;Abnormal postural alignment and control Motor - Skilled Clinical Observations: R hemiplegia Motor - Discharge Observations: R hemiplegia; increased adductor tone RLE  Mobility Bed Mobility Bed Mobility: Rolling Left;Supine to Sit;Sit to Supine Rolling Left: 5: Supervision Supine to Sit: 5: Supervision Supine to Sit Details: Verbal cues for technique Sit to Supine: 5: Supervision Transfers Sit to Stand: With upper extremity assist;From chair Stand to Sit: 6: Modified independent (Device/Increase time);With armrests;With upper extremity assist Stand Pivot Transfers: 5: Supervision Stand Pivot Transfer Details: Verbal cues for precautions/safety;Verbal cues for safe use of  DME/AE Locomotion  Ambulation Ambulation: Yes Ambulation/Gait Assistance: 5: Supervision Ambulation Distance (Feet): 150 Feet Assistive device: Rolling walker Ambulation/Gait Assistance Details: Verbal cues for safe use of DME/AE Ambulation/Gait Assistance Details: 150 Gait Gait: Yes Gait Pattern: Impaired Gait Pattern: Decreased trunk rotation;Decreased dorsiflexion - right;Decreased hip/knee flexion -  right;Right genu recurvatum;Trunk rotated posteriorly on right;Step-through pattern Gait velocity: decr Stairs / Additional Locomotion Stairs: Yes Stairs Assistance: 3: Mod assist Stairs Assistance Details: Manual facilitation for weight shifting;Manual facilitation for placement;Verbal cues for technique Stairs Assistance Details (indicate cue type and reason): unsafe adduction RLE when descending Stair Management Technique: One rail Left Number of Stairs: 8 Height of Stairs: 6 Ramp: 4: Min assist Curb: Not tested (comment) Product manager Mobility: Yes Wheelchair Assistance: 5: Supervision Wheelchair Propulsion: Left upper extremity;Left lower extremity Wheelchair Parts Management: Needs assistance Distance: 150  Trunk/Postural Assessment  Cervical Assessment Cervical Assessment: Within Functional Limits Thoracic Assessment Thoracic Assessment: Within Functional Limits Lumbar Assessment Lumbar Assessment: Exceptions to [facility] (sits in posterior pelvic tilt) Postural Control Postural Control: Deficits on evaluation (delayed and inadequate balance strategies with minimal perturbations externally)  Balance Balance Balance Assessed: Yes Static Sitting Balance Static Sitting - Balance Support: Feet supported Static Sitting - Level of Assistance: 6: Modified independent (Device/Increase time) Dynamic Sitting Balance Dynamic Sitting - Balance Support: Feet supported;During functional activity Dynamic Sitting - Level of Assistance: 5: Stand by assistance Dynamic  Sitting - Balance Activities: Reaching for objects Static Standing Balance Static Standing - Balance Support: During functional activity Static Standing - Level of Assistance: 6: Modified independent (Device/Increase time) Dynamic Standing Balance Dynamic Standing - Balance Support: During functional activity Dynamic Standing - Level of Assistance: 5: Stand by assistance Extremity Assessment   RLE Assessment RLE Assessment: Exceptions to [facility] RLE Strength RLE Overall Strength: Deficits RLE Overall Strength Comments: grossly 2/5 except ankle 0/5-1/5 LLE Assessment LLE Assessment: Within Functional Limits   See Function Navigator for Current Functional Status.  Tate Jerkins 07/17/2016, 4:58 PM

## 2016-07-17 NOTE — Progress Notes (Signed)
Occupational Therapy Discharge Summary  Patient Details  Name: [name] MRN: [id number] Date of Birth: 1951-04-29  Today's Date: 07/17/2016 OT Individual Time: 0905-1004 OT Individual Time Calculation (min): 59 min    Session Note:  Pt completed bathing and dressing with supervision during session.  Supervision for mobility to the walk-in shower with use of the quad cane.  He was able to complete all bathing with close supervision, utilizing LH sponge for washing his LEs, back, and left shoulder.  He was able to complete dressing supine to sit on the EOB but needed min instructional cueing to remember to scoot backwards onto the bed after returning to sitting.  Finished session by having pt ambulate from his dresser to his closet drawer to move his washed clothing so he could place his dirty clothing in the closet drawer.  He was able to use the RUE for carrying the clothes under his arm as well.  Once finished he transferred to the wheelchair.  His daughter came in at end of session.  Discussed his current level at supervision overall for most selfcare tasks and not being consistently safe at this time.  SW and daughter in room at end of session.    Patient has met 10 of 12 long term goals due to improved balance, postural control and ability to compensate for deficits.  Patient to discharge at overall Supervision level.  Patient's care partner unavailable to provide the necessary physical and cognitive assistance at discharge.    Reasons goals not met: Pt needs supervision for dynamic standing balance, as well as supervision for carryover of safety and dressing techniques sitting EOB.    Recommendation:  Patient will benefit from ongoing skilled OT services in skilled nursing facility setting to continue to advance functional skills in the area of BADL.  Pt still needs supervision for safety with selfcare tasks at this time.  Feel he will benefit from short stay SNF level rehab to continue  progressing toward modified independent level for possible return home.    Equipment: No equipment provided  Reasons for discharge: treatment goals met and discharge from hospital  Patient/family agrees with progress made and goals achieved: Yes  OT Discharge Precautions/Restrictions  Precautions Precautions: Fall Precaution Comments: Rt hemiparesis, increased tone in the right UE and LE Restrictions Weight Bearing Restrictions: No  Vital Signs Therapy Vitals Temp: 98.6 F (37 C) Temp Source: Oral Pulse Rate: 71 Resp: 16 BP: (!) 136/58 Patient Position (if appropriate): Sitting Oxygen Therapy SpO2: 99 % O2 Device: Not Delivered Pain Pain Assessment Pain Assessment: No/denies pain ADL   Vision/Perception  Vision- History Baseline Vision/History: Wears glasses Wears Glasses: Reading only Patient Visual Report: No change from baseline  Cognition Overall Cognitive Status: History of cognitive impairments - at baseline Arousal/Alertness: Awake/alert Orientation Level: Oriented X4 Memory: Impaired Memory Impairment: Decreased long term memory;Decreased recall of new information Decreased Long Term Memory: Verbal basic;Functional basic Awareness: Impaired Awareness Impairment: Anticipatory impairment Problem Solving: Impaired Problem Solving Impairment: Functional complex Executive Function:  (all impaired due to lower level cognitive deficits) Safety/Judgment: Impaired Comments: Pt needs cueing to scoot back on the bed when attempting LB dressing tasks.  He will slowly slide forward or demonstrate execessive lean to the right when attempting to buckle AFO, without any attempted correction.   Sensation Sensation Light Touch: Appears Intact Stereognosis: Not tested Hot/Cold: Not tested Proprioception: Not tested Coordination Gross Motor Movements are Fluid and Coordinated: No Fine Motor Movements are Fluid and Coordinated: No Coordination  and Movement  Description: Pt with increased flexor tone in the elbow, shoulder extensors, and digit flexors of the RUE.  He is able to use the RUE as a gross assist for holding items such as the LH sponge or bottle to be opened, but cannot use the RUE actively for selfcare tasks without having physical assistance.   Motor  Motor Motor: Hemiplegia;Abnormal tone;Abnormal postural alignment and control Mobility  Bed Mobility Rolling Left: 5: Supervision Supine to Sit: 5: Supervision Transfers Transfers: Stand to Sit Sit to Stand: With upper extremity assist;From chair/3-in-1;With armrests;6: Modified independent (Device/Increase time) Stand to Sit: 6: Modified independent (Device/Increase time);With armrests;With upper extremity assist  Trunk/Postural Assessment  Cervical Assessment Cervical Assessment: Within Functional Limits Thoracic Assessment Thoracic Assessment: Exceptions to [facility] (right trunk elongation) Lumbar Assessment Lumbar Assessment: Exceptions to [facility] (maintains posterior pelvic tilt in sitting)  Balance Balance Balance Assessed: Yes Static Sitting Balance Static Sitting - Balance Support: Feet supported Static Sitting - Level of Assistance: 6: Modified independent (Device/Increase time) Dynamic Sitting Balance Dynamic Sitting - Balance Support: Feet supported;During functional activity Dynamic Sitting - Level of Assistance: 5: Stand by assistance Dynamic Sitting - Balance Activities: Reaching for objects Static Standing Balance Static Standing - Balance Support: During functional activity Static Standing - Level of Assistance: 6: Modified independent (Device/Increase time) Dynamic Standing Balance Dynamic Standing - Balance Support: During functional activity Dynamic Standing - Level of Assistance: 5: Stand by assistance Extremity/Trunk Assessment RUE Assessment RUE Assessment: Exceptions to [facility] RUE Strength RUE Overall Strength Comments: Pt exhibits 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  Increased flexor tone noted in the digits and elbow restricting AROM with elbow extension and digit extension.  He uses synergy pattern for use as a stabilizer for holding LH sponge when washing the LUE or when opening bottle tops with the LUE.   LUE Assessment LUE Assessment: Within Functional Limits   See Function Navigator for Current Functional Status.  Idy Rawling OTR/L 07/17/2016, 4:11 PM

## 2016-07-17 NOTE — Progress Notes (Deleted)
Physical Therapy Note  Patient Details  Name: [name] MRN: [id number] Date of Birth: 1950-09-25 Today's Date: 07/17/2016  1430-1510, 26  Min individual tx Pain: no c/o  Gait over level tile, ramp, mulched ground, simulated car transfer, stairs.  Dynamic sitting and standing balance.  Pt provided external perturbations; he did not exhibit any balance strategies given a minimal disturbance backward, and would have fallen backwards without assistance. Pt left resting in w/c with all needs within reach.      Jonna Dittrich 07/17/2016, 12:51 PM

## 2016-07-17 NOTE — Plan of Care (Signed)
Problem: RH Light Housekeeping Goal: LTG Patient will perform light housekeeping w/assist (OT) LTG: Patient will perform light housekeeping with assistance, with/without cues (OT).  Outcome: Not Applicable Date Met: 78/58/85 Pt transferring to SNF, did not attempt.  Problem: RH Memory Goal: LTG Patient will demonstrate ability for day to day (OT) LTG:  Patient will demonstrate ability for day to day recall/carryover during activities of daily living with assist  (OT)  Outcome: Not Met (add Reason) Needs supervision for carryover of safety with dressing tasks EOB.

## 2016-07-17 NOTE — Progress Notes (Signed)
Subjective/Complaints: Up in chair. Just finished breakfast. Happy with his progress.   ROS: pt denies nausea, vomiting, diarrhea, cough, shortness of breath or chest pain     Objective: Vital Signs: Blood pressure (!) 145/67, pulse 61, temperature 97.7 F (36.5 C), temperature source Oral, resp. rate 18, height 5' 7"  (1.702 m), weight 109 kg (240 lb 6.4 oz), SpO2 100 %. No results found. Results for orders placed or performed during the hospital encounter of 07/04/16 (from the past 72 hour(s))  Glucose, capillary     Status: None   Collection Time: 07/14/16 11:46 AM  Result Value Ref Range   Glucose-Capillary 95 65 - 99 mg/dL  Glucose, capillary     Status: Abnormal   Collection Time: 07/14/16  5:14 PM  Result Value Ref Range   Glucose-Capillary 173 (H) 65 - 99 mg/dL  Urine culture     Status: Abnormal   Collection Time: 07/14/16  5:16 PM  Result Value Ref Range   Specimen Description URINE, RANDOM    Special Requests NONE    Culture MULTIPLE SPECIES PRESENT, SUGGEST RECOLLECTION (A)    Report Status 07/15/2016 FINAL   Glucose, capillary     Status: Abnormal   Collection Time: 07/14/16  8:35 PM  Result Value Ref Range   Glucose-Capillary 221 (H) 65 - 99 mg/dL   Comment 1 Notify RN   Glucose, capillary     Status: Abnormal   Collection Time: 07/15/16  6:49 AM  Result Value Ref Range   Glucose-Capillary 101 (H) 65 - 99 mg/dL  Glucose, capillary     Status: Abnormal   Collection Time: 07/15/16 11:08 AM  Result Value Ref Range   Glucose-Capillary 104 (H) 65 - 99 mg/dL   Comment 1 Notify RN   Glucose, capillary     Status: Abnormal   Collection Time: 07/15/16  4:16 PM  Result Value Ref Range   Glucose-Capillary 158 (H) 65 - 99 mg/dL   Comment 1 Notify RN   Glucose, capillary     Status: Abnormal   Collection Time: 07/15/16  9:05 PM  Result Value Ref Range   Glucose-Capillary 141 (H) 65 - 99 mg/dL  Basic metabolic panel     Status: Abnormal   Collection Time:  07/16/16  6:14 AM  Result Value Ref Range   Sodium 139 135 - 145 mmol/L   Potassium 4.6 3.5 - 5.1 mmol/L   Chloride 103 101 - 111 mmol/L   CO2 30 22 - 32 mmol/L   Glucose, Bld 103 (H) 65 - 99 mg/dL   BUN 15 6 - 20 mg/dL   Creatinine, Ser 1.07 0.61 - 1.24 mg/dL   Calcium 9.0 8.9 - 10.3 mg/dL   GFR calc non Af Amer >60 >60 mL/min   GFR calc Af Amer >60 >60 mL/min    Comment: (NOTE) The eGFR has been calculated using the CKD EPI equation. This calculation has not been validated in all clinical situations. eGFR's persistently <60 mL/min signify possible Chronic Kidney Disease.    Anion gap 6 5 - 15  CBC     Status: Abnormal   Collection Time: 07/16/16  6:14 AM  Result Value Ref Range   WBC 11.6 (H) 4.0 - 10.5 K/uL   RBC 4.28 4.22 - 5.81 MIL/uL   Hemoglobin 12.5 (L) 13.0 - 17.0 g/dL   HCT 39.6 39.0 - 52.0 %   MCV 92.5 78.0 - 100.0 fL   MCH 29.2 26.0 - 34.0 pg   MCHC  31.6 30.0 - 36.0 g/dL   RDW 12.4 11.5 - 15.5 %   Platelets 205 150 - 400 K/uL  Glucose, capillary     Status: Abnormal   Collection Time: 07/16/16  6:29 AM  Result Value Ref Range   Glucose-Capillary 116 (H) 65 - 99 mg/dL  Glucose, capillary     Status: Abnormal   Collection Time: 07/16/16 11:17 AM  Result Value Ref Range   Glucose-Capillary 161 (H) 65 - 99 mg/dL   Comment 1 Notify RN      General: No acute distress. Vital signs reviewed. Psych: Slowed Heart: RRR Lungs: CTA B Abdomen: Positive bowel sounds, soft  Skin: No evidence of breakdown, Intact Neurologic: Alert and oriented x3 cues for date of month. Still with delays in expressive language and processing---improved overall. Speech clearer too Motor: 5/5 in left deltoid, bicep, tricep, grip, hip flexor, knee extensors, ankle dorsiflexor and plantar flexor 3+ to 4-/5 minus in the right deltoid, bicep, tricep, finger flexors, hip flexor knee extensor, ankle dorsiflexor with ongoing apraxia--is activating more.  Musculoskeletal: No edema. No  tenderness  Assessment/Plan: 1. Functional deficits secondary to left ACA infarct with right hemiparesis which require 3+ hours per day of interdisciplinary therapy in a comprehensive inpatient rehab setting. Physiatrist is providing close team supervision and 24 hour management of active medical problems listed below. Physiatrist and rehab team continue to assess barriers to discharge/monitor patient progress toward functional and medical goals. FIM: Function - Bathing Position: Shower Body parts bathed by patient: Chest, Abdomen, Front perineal area, Buttocks, Right upper leg, Left upper leg, Right lower leg, Left lower leg, Back, Right arm, Left arm Body parts bathed by helper: Left arm Assist Level: Supervision or verbal cues  Function- Upper Body Dressing/Undressing What is the patient wearing?: Pull over shirt/dress Pull over shirt/dress - Perfomed by patient: Thread/unthread right sleeve, Thread/unthread left sleeve, Put head through opening, Pull shirt over trunk Pull over shirt/dress - Perfomed by helper: Thread/unthread right sleeve, Thread/unthread left sleeve, Put head through opening, Pull shirt over trunk Assist Level: Supervision or verbal cues Function - Lower Body Dressing/Undressing What is the patient wearing?: Shoes, AFO, Pants, Ted Hose, Underwear Position: Sitting EOB Underwear - Performed by patient: Thread/unthread right underwear leg, Thread/unthread left underwear leg, Pull underwear up/down Underwear - Performed by helper: Thread/unthread right underwear leg, Thread/unthread left underwear leg, Pull underwear up/down Pants- Performed by patient: Thread/unthread right pants leg, Thread/unthread left pants leg, Pull pants up/down Pants- Performed by helper: Thread/unthread right pants leg, Thread/unthread left pants leg, Pull pants up/down Non-skid slipper socks- Performed by patient: Don/doff left sock, Don/doff right sock Non-skid slipper socks- Performed by  helper: Don/doff right sock Shoes - Performed by patient: Don/doff right shoe, Don/doff left shoe, Fasten right, Fasten left AFO - Performed by patient: Don/doff right AFO TED Hose - Performed by helper: Don/doff left TED hose, Don/doff right TED hose Assist for footwear: Supervision/touching assist Assist for lower body dressing: Touching or steadying assistance (Pt > 75%)  Function - Toileting Toileting activity did not occur: No continent bowel/bladder event (urinal) Toileting steps completed by patient: Adjust clothing prior to toileting, Adjust clothing after toileting Toileting steps completed by helper: Adjust clothing prior to toileting, Adjust clothing after toileting Toileting Assistive Devices: Grab bar or rail Assist level: No help/no cues  Function - Air cabin crew transfer activity did not occur: Refused Toilet transfer assistive device: Grab bar Assist level to toilet: Touching or steadying assistance (Pt > 75%) Assist level from  toilet: Touching or steadying assistance (Pt > 75%)  Function - Chair/bed transfer Chair/bed transfer method: Stand pivot Chair/bed transfer assist level: Supervision or verbal cues Chair/bed transfer assistive device: Cane Chair/bed transfer details: Verbal cues for technique  Function - Locomotion: Wheelchair Will patient use wheelchair at discharge?: No Type: Manual Max wheelchair distance: 150 Assist Level: Supervision or verbal cues Wheel 50 feet with 2 turns activity did not occur: Safety/medical concerns Assist Level: Supervision or verbal cues Wheel 150 feet activity did not occur: Safety/medical concerns Assist Level: Supervision or verbal cues Turns around,maneuvers to table,bed, and toilet,negotiates 3% grade,maneuvers on rugs and over doorsills: No Function - Locomotion: Ambulation Assistive device: Cane-quad Max distance: 90 Assist level: Supervision or verbal cues Assist level: Supervision or verbal cues Walk 50  feet with 2 turns activity did not occur: Safety/medical concerns Assist level: Supervision or verbal cues Walk 150 feet activity did not occur: Safety/medical concerns Assist level: Touching or steadying assistance (Pt > 75%) Walk 10 feet on uneven surfaces activity did not occur: Safety/medical concerns Assist level: Touching or steadying assistance (Pt > 75%)  Function - Comprehension Comprehension: Auditory Comprehension assist level: Follows basic conversation/direction with extra time/assistive device  Function - Expression Expression: Verbal Expression assist level: Expresses basic 75 - 89% of the time/requires cueing 10 - 24% of the time. Needs helper to occlude trach/needs to repeat words.  Function - Social Interaction Social Interaction assist level: Interacts appropriately with others with medication or extra time (anti-anxiety, antidepressant).  Function - Problem Solving Problem solving assist level: Solves basic 90% of the time/requires cueing < 10% of the time  Function - Memory Memory assist level: Recognizes or recalls 75 - 89% of the time/requires cueing 10 - 24% of the time Patient normally able to recall (first 3 days only): Staff names and faces, That he or she is in a hospital, Current season, Location of own room  Medical Problem List and Plan: 1. 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 DVT Prophylaxis/Anticoagulation: Subcutaneous Lovenox. Monitor platelet counts and any signs of bleeding 3. Pain Management: Tylenol as needed 4. Mood: Zoloft 100 mg daily 5. Neuropsych: This patient iscapable of making decisions on hisown behalf. 6. Skin/Wound Care: Routine skin checks 7. Fluids/Electrolytes/Nutrition:  Excellent PO  intake 8.Diabetes mellitus of peripheral neuropathy. Hemoglobin A1c 6.9.Levemir 30 units daily. Check blood sugars before meals and at bedtime. Diabetic teaching   added metformin 500 mg 2/5 with breakfast, increased to 101m, reduced Levemir 27 units  Sugars leveled out yesterday 9.CRI stage II.   -BUN Cr stable 2/14 10.Hypertension. Patient on Coreg 25 mg twice a day, HCTZ 25 mg daily, lisinopril 40 mg daily, Aldactone 50 mg daily, Norvasc 10 mg daily, Revatio 60 mg daily prior to admission.   Resumed amlodipine 181m HCTZ restarted 2/10, changed to chlorthalidone on 2/11  -improved control overall 11.Hyperlipidemia. Lipitor 12.Question medical compliance. Provide education 13. Leukocytosis: down to 11.6 today  -no signs of infection 14. Hypoalbuminemia. Moderate, pro-stat 15. Malodorous urine/incontinence: follow up ucx with multispecies  LOS (Days) 13 A FACE TO FACE EVALUATION WAS PERFORMED  Thornton Dohrmann T 07/17/2016, 9:52 AM

## 2016-07-17 NOTE — Discharge Summary (Signed)
Discharge summary job [phone]

## 2016-07-17 NOTE — Progress Notes (Signed)
Physical Therapy Session Note  Patient Details  Name: [name] MRN: [id number] Date of Birth: [birth year]  Today's Date: 07/17/2016 PT Individual Time: 1635-1700 PT Individual Time Calculation (min): 25 min   Short Term Goals: Week 2:  PT Short Term Goal 1 (Week 2): = LTGs due to LOS  Skilled Therapeutic Interventions/Progress Updates:   Pt received sitting in WC and agreeable to PT.   Patient packed suitcase sitting in WC and standing with intermittent UE support on QC with distant supervision assist from PT when reaching to low shelf. Patient demonstrated no LOB throughout packing task. PT instructed patient in bed mobility at mat in rehab gym for supine>sit with increased time and effort, but no cues from PT. Patient returned to room and left sitting in [facility] with call bell in reach and all needs met.          Therapy Documentation Precautions:  Precautions Precautions: Fall Precaution Comments: Rt hemiparesis, increased tone in the right UE and LE Restrictions Weight Bearing Restrictions: No Vital Signs: Therapy Vitals Temp: 98.6 F (37 C) Temp Source: Oral Pulse Rate: 71 Resp: 16 BP: (!) 136/58 Patient Position (if appropriate): Sitting Oxygen Therapy SpO2: 99 % O2 Device: Not Delivered Pain: Pain Assessment Pain Assessment: No/denies pain   See Function Navigator for Current Functional Status.   Therapy/Group: Individual Therapy  Lorie Phenix 07/17/2016, 5:16 PM

## 2016-07-17 NOTE — Discharge Summary (Signed)
NAMEMarland Kitchen  [name], [name] NO.:  [id number]  MEDICAL RECORD NO.:  [id number]  LOCATION:                                 FACILITY:  PHYSICIAN:  [name], M.D.DATE OF BIRTH:  [birth year]  DATE OF ADMISSION:  07/04/2016 DATE OF DISCHARGE:  07/18/2016                              DISCHARGE SUMMARY   DISCHARGE DIAGNOSES: 1. 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 2. Subcutaneous Lovenox for DVT prophylaxis. 3. Pain management. 4. Depression. 5. Diabetes mellitus. 6. Peripheral neuropathy. 7. Chronic kidney disease, stage 2. 8. Hypertension. 9. Hyperlipidemia. 10.Medical noncompliance.  HISTORY OF PRESENT ILLNESS:  This is a 66 year old right-handed male with history of hypertension, diabetes mellitus stage 2, chronic kidney disease as well as CVA in 2008 with residual right-sided weakness, maintained on aspirin and Plavix.  Per chart review, he lives alone, used a quad cane prior to admission.  He has a daughter in the area. Presented on June 30, 2016, with increasing lethargy over 3 days, multiple falls, altered mental status as well as reports of diarrhea. WBC 23,000.  Sodium 132.  Cranial CT scan negative.  Chest x-ray, no active disease.  Clostridium difficile specimen negative.  Protocol initiated for suspect sepsis.  MRI showed diffusion restriction within the left aspect of corpus callosum extending into the left cingulate gyrus, left frontal lobe compatible with acute early subacute infarct, left ACA distribution.  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 Atherosclerotic disease of both proximal vertebral arteries.  Occlusion or near occlusion of left anterior cerebral artery.  Echocardiogram with ejection fraction of 123456, grade 2 diastolic dysfunction.  No wall motion abnormalities.  Neurology consulted, advised  continue aspirin and Plavix therapy.  Subcutaneous Lovenox for DVT prophylaxis.  Tolerating a regular diet.  The patient was admitted for a comprehensive rehab program.  PAST MEDICAL HISTORY:  See discharge diagnoses.  SOCIAL HISTORY:  Lives alone.  Used a quad cane prior to admission. Functional status upon admission to rehab services was minimal assist, 30 feet, quad cane, 2-person handheld assist for equipment use, minimal assist sit to stand, min-to-mod assist for activities of daily living.  PHYSICAL EXAMINATION:  VITAL SIGNS:  Blood pressure 144/60, pulse 58, temperature 98, respirations 20. GENERAL:  This was an alert male.  Speech mildly dysarthric.  Some expressive aphasia.  He was able to use short phrases to communicate. LUNGS:  Clear to auscultation without wheeze. CARDIAC:  Regular rate and rhythm without murmur. ABDOMEN:  Soft, nontender.  Good bowel sounds.  REHABILITATION HOSPITAL COURSE:  The patient was admitted to Inpatient Rehab Services with therapies initiated on a 3-hour daily basis, consisting of physical therapy, occupational therapy, speech therapy, and rehabilitation nursing.  The following issues were addressed during the patient's rehabilitation stay.  Pertaining to [name], left ACA infarct remained stable, maintained on aspirin and Plavix therapy as well as history of left cortical infarct with spastic right hemiparesis. Subcutaneous Lovenox for DVT prophylaxis.  No bleeding episodes.  He did receive a Dysport injection on July 10, 2016, for spastic hemiparesis.  He continued on Zoloft for history of depression.  He was attending full therapies.  Diabetes mellitus, peripheral neuropathy. Hemoglobin A1c of 6.9.  He remained on insulin therapy as directed as well as Glucophage.  Blood pressures were well controlled on Norvasc as well as Hygroton.  He continued on Lipitor for hyperlipidemia.  His diet had been advanced to a regular consistency.   The patient received weekly collaborative interdisciplinary team conferences to discuss estimated length of stay, family teaching, any barriers to discharge.  He was ambulating 150-feet distance supervision using a large-base quad cane, ambulating over a ramp and uneven surfaces with quad cane.  Performed sit to stand, stand pivot transfers, x3 supervision, propelled his wheelchair independently.  He could gather simple belongings for activities of daily living and homemaking.  Again addressing all issues of safety.  He was able to communicate his full needs.  Due to limited assistance at home, it was felt skilled nursing facility was needed with bed becoming available on July 18, 2016.  DISCHARGE MEDICATIONS: 1. Norvasc 5 mg p.o. daily. 2. Aspirin 325 mg p.o. daily. 3. Lipitor 40 mg p.o. daily. 4. Hygroton 25 mg p.o. daily. 5. Plavix 75 mg p.o. daily. 6. Levemir insulin 27 units daily. 7. Glucophage 1000 mg p.o. daily. 8. Zoloft 100 mg p.o. daily. 9. Tylenol as needed.  DIET:  1800 calorie ADA diet.  DISCHARGE INSTRUCTIONS:  The patient would follow up with Dr. Alysia Chapman at the [facility] as directed; Dr. Erlinda Chapman Neurology Service, 1 month call for appointment.     [name], P.A.   ______________________________ [name], M.D.    DA/MEDQ  D:  07/17/2016  T:  07/17/2016  Job:  [id number]  cc:   Kyle Mo, MD [name] [name], M.D.

## 2016-07-17 NOTE — Progress Notes (Signed)
Social Work Patient ID: Conni Elliot, male   DOB: 15-Feb-1951, 66 y.o.   MRN: [id number]  Daughter-[name] here to discuss the concerns she has about pt going home alone. She reports he will not ask for help and be right back here in a week. He had difficulty with insulin and asked if RN has had pt give himself insulin in preparation of home. Will have RN do this at lunch time. She would like for pt to go to a NH form here for 2-3 weeks. Made aware he needs to be agreeable to this. He now is saying he is. Both would like Either [facility] or [facility]. Will start process.

## 2016-07-17 NOTE — NC FL2 (Signed)
[place] LEVEL OF CARE SCREENING TOOL     IDENTIFICATION  Patient Name: [name] Birthdate: 01/26/1951 Sex: male Admission Date (Current Location): 07/04/2016  [facility] and [place] Number:  Herbalist and Address:  [facility], [place] [address] [place], [place] [address]      Provider Number: [id number]  Attending Physician Name and Address:  Charlett Blake, MD  Relative Name and Phone Number:       Current Level of Care: Other (Comment) (rehab) Recommended Level of Care: [place] Prior Approval Number:    Date Approved/Denied:   PASRR Number: [id number]  Discharge Plan: SNF    Current Diagnoses: Patient Active Problem List   Diagnosis Date Noted  . Stage 2 chronic kidney disease   . Leukocytosis   . Type 2 diabetes mellitus with peripheral neuropathy (HCC)   . Benign essential HTN   . Spastic hemiparesis ([place])   . Cerebrovascular small vessel disease 07/04/2016  . Aphasia due to recent cerebral infarction 07/04/2016  . Cerebrovascular accident (CVA) ([place])   . Altered mental status 06/30/2016  . Fall 06/30/2016  . Acute encephalopathy 06/30/2016  . Sepsis ([place]) 06/30/2016  . Diarrhea 06/30/2016  . Acute renal failure superimposed on stage 2 chronic kidney disease ([place]) 06/30/2016  . Hemiparesis affecting right side as late effect of stroke ([place]) 10/23/2015  . PAD (peripheral artery disease) ([place]) 10/20/2011  . Hyperlipidemia 02/21/2010  . CEREBROVASCULAR ACCIDENT, HX OF 12/22/2008  . Diabetes mellitus, type II ([place]) 07/30/2006  . OBESITY, NOS 07/30/2006  . Essential hypertension, benign 07/30/2006    Orientation RESPIRATION BLADDER Height & Weight     Self, Time, Situation, Place  Normal Continent Weight: 240 lb 6.4 oz (109 kg) Height:  5\' 7"  (170.2 cm)  BEHAVIORAL SYMPTOMS/MOOD NEUROLOGICAL BOWEL NUTRITION STATUS      Continent Diet (Carb modified)  AMBULATORY STATUS COMMUNICATION  OF NEEDS Skin   Limited Assist Verbally Normal                       Personal Care Assistance Level of Assistance  Bathing, Dressing Bathing Assistance: Limited assistance   Dressing Assistance: Limited assistance     Functional Limitations Info  Speech          SPECIAL CARE FACTORS FREQUENCY  PT (By licensed PT), OT (By licensed OT), Speech therapy     PT Frequency: 5x week OT Frequency: 5 x week     Speech Therapy Frequency: 5 x week      Contractures Contractures Info: Not present    Additional Factors Info  Code Status Code Status Info: Full Code Allergies Info: NKDA           Current Medications (07/17/2016):  This is the current hospital active medication list Current Facility-Administered Medications  Medication Dose Route Frequency Provider Last Rate Last Dose  . acetaminophen (TYLENOL) tablet 650 mg  650 mg Oral Q6H PRN Lavon Paganini Angiulli, PA-C       Or  . acetaminophen (TYLENOL) suppository 650 mg  650 mg Rectal Q6H PRN Lavon Paganini Angiulli, PA-C      . amLODipine (NORVASC) tablet 5 mg  5 mg Oral Daily Charlett Blake, MD   5 mg at 07/17/16 0809  . aspirin tablet 325 mg  325 mg Oral Daily Lavon Paganini Angiulli, PA-C   325 mg at 07/17/16 0809  . atorvastatin (LIPITOR) tablet 40 mg  40 mg  Oral QPM Lavon Paganini Angiulli, PA-C   40 mg at 07/16/16 1737  . chlorthalidone (HYGROTON) tablet 25 mg  25 mg Oral Daily Ankit Lorie Phenix, MD   25 mg at 07/17/16 [id number]  . clopidogrel (PLAVIX) tablet 75 mg  75 mg Oral Q breakfast Lavon Paganini Angiulli, PA-C   75 mg at 07/17/16 0809  . enoxaparin (LOVENOX) injection 40 mg  40 mg Subcutaneous Q24H Lavon Paganini Angiulli, PA-C   40 mg at 07/16/16 0956  . insulin aspart (novoLOG) injection 0-5 Units  0-5 Units Subcutaneous QHS Lavon Paganini Angiulli, PA-C   2 Units at 07/14/16 2249  . insulin detemir (LEVEMIR) injection 27 Units  27 Units Subcutaneous Daily Charlett Blake, MD   27 Units at 07/17/16 [phone]  . metFORMIN (GLUCOPHAGE) tablet 1,000  mg  1,000 mg Oral Q breakfast Charlett Blake, MD   1,000 mg at 07/17/16 0809  . ondansetron (ZOFRAN) tablet 4 mg  4 mg Oral Q6H PRN Lavon Paganini Angiulli, PA-C       Or  . ondansetron (ZOFRAN) injection 4 mg  4 mg Intravenous Q6H PRN Lavon Paganini Angiulli, PA-C      . sertraline (ZOLOFT) tablet 100 mg  100 mg Oral Daily Lavon Paganini Angiulli, PA-C   100 mg at 07/17/16 0809  . sorbitol 70 % solution 30 mL  30 mL Oral Daily PRN Lavon Paganini Angiulli, PA-C   30 mL at 07/06/16 1526     Discharge Medications: Please see discharge summary for a list of discharge medications.  Relevant Imaging Results:  Relevant Lab Results:   Additional Information SSN: [id number]  Srah Ake, Gardiner Rhyme, LCSW

## 2016-07-17 NOTE — Progress Notes (Signed)
Speech Language Pathology Discharge Summary  Patient Details  Name: [name] MRN: [id number] Date of Birth: 1951-01-18  Today's Date: 07/17/2016 SLP Individual Time: 0830-0900 SLP Individual Time Calculation (min): 30 min   Patient has met 3 of 4 long term goals.  Patient to discharge at overall Min;Mod level.  Reasons goals not met:   Pt requires Mod A verbal cues for complex daily situations  Clinical Impression/Discharge Summary:   Pt has met 3 of 4 LTG's and is currently Min A to Mod A for functional cognitive linguistic skills. Pt would benefit from continued skilled ST at next venue of care to address speech intelligibility at the sentence/simple conversation, awareness and complex problem solving. Education completed with daughter.   Care Partner:  Caregiver Able to Provide Assistance: No  Type of Caregiver Assistance: Physical;Cognitive  Recommendation:  Skilled Nursing facility  Rationale for SLP Follow Up: Maximize functional communication;Maximize cognitive function and independence;Maximize swallowing safety;Reduce caregiver burden   Equipment:     Reasons for discharge: Treatment goals met   Patient/Family Agrees with Progress Made and Goals Achieved: Yes   Function:  Eating Eating     Eating Assist Level: More than reasonable amount of time;Set up assist for           Cognition Comprehension Comprehension assist level: Follows basic conversation/direction with extra time/assistive device  Expression   Expression assist level: Expresses basic 75 - 89% of the time/requires cueing 10 - 24% of the time. Needs helper to occlude trach/needs to repeat words.  Social Interaction Social Interaction assist level: Interacts appropriately 90% of the time - Needs monitoring or encouragement for participation or interaction.  Problem Solving Problem solving assist level: Solves basic 75 - 89% of the time/requires cueing 10 - 24% of the time  Memory Memory assist  level: Recognizes or recalls 75 - 89% of the time/requires cueing 10 - 24% of the time   Vernisha Bacote B. Rutherford Nail, M.S., CCC-SLP Speech-Language Pathologist  [name] 07/17/2016, 12:54 PM

## 2016-07-17 NOTE — Progress Notes (Signed)
Social Work Patient ID: [name], male   DOB: April 18, 1951, 66 y.o.   MRN: [id number]  [facility] have informed pt and daughter it is a private room. Pt is agrreable to this option. Will plan to transfer over there tomorrow. Daughter to contact Rhonda-admissions at [place] and schedule time for paperwork. Dan-PA aware of plan for tomorrow.

## 2016-07-17 NOTE — Plan of Care (Signed)
Problem: RH Balance Goal: LTG Patient will maintain dynamic standing with ADLs (OT) LTG:  Patient will maintain dynamic standing balance with assist during activities of daily living (OT)   Outcome: Not Met (add Reason) Needs supervision.

## 2016-07-17 NOTE — Progress Notes (Signed)
Speech Language Pathology Daily Session Note  Patient Details  Name: [name] MRN: [id number] Date of Birth: Nov 09, 1950  Today's Date: 07/17/2016 SLP Individual Time: 0830-0900 SLP Individual Time Calculation (min): 30 min  Short Term Goals: Week 2: SLP Short Term Goal 1 (Week 2): Patient will request help as needed for safety with completion of basic self-care tasks with supervision question cues.  SLP Short Term Goal 2 (Week 2): Patient will self-monitor and correct errors during mildly complex problem solving tasks with supervision question cues. SLP Short Term Goal 3 (Week 2): Patient will produce final sounds of words during sentence level verbal expression tasks with Mod assist mulimodal cues. SLP Short Term Goal 4 (Week 2): Patient will produce /l/ in all positions of words with min assist verbal and visual cues to elevate tongue.  SLP Short Term Goal 5 (Week 2): Patient will utilize lingual sweep to manage right sided buccal pocketing Mod I.   Skilled Therapeutic Interventions: Skilled treatment session focused on cognition goals. SLP facilitated session by providing Min A faded to supervision cues for novel semi-complex game. Pt was supervision with self-monitoring and correct errors before they occurred. Pt was left upright in wheelchair in his room, all needs within reach. Continue per current plan of care.      Function:    Cognition Comprehension Comprehension assist level: Follows basic conversation/direction with extra time/assistive device  Expression   Expression assist level: Expresses basic 75 - 89% of the time/requires cueing 10 - 24% of the time. Needs helper to occlude trach/needs to repeat words.  Social Interaction Social Interaction assist level: Interacts appropriately with others with medication or extra time (anti-anxiety, antidepressant).  Problem Solving Problem solving assist level: Solves basic 90% of the time/requires cueing < 10% of the time   Memory Memory assist level: Recognizes or recalls 75 - 89% of the time/requires cueing 10 - 24% of the time    Pain Pain Assessment Pain Assessment: No/denies pain  Therapy/Group: Individual Therapy  [name] B. Rutherford Nail, M.S., CCC-SLP Speech-Language Pathologist  [name] 07/17/2016, 9:32 AM

## 2016-07-18 DIAGNOSIS — I10 Essential (primary) hypertension: Secondary | ICD-10-CM | POA: Diagnosis not present

## 2016-07-18 DIAGNOSIS — R498 Other voice and resonance disorders: Secondary | ICD-10-CM | POA: Diagnosis not present

## 2016-07-18 DIAGNOSIS — K9181 Other intraoperative complications of digestive system: Secondary | ICD-10-CM | POA: Diagnosis not present

## 2016-07-18 DIAGNOSIS — I69351 Hemiplegia and hemiparesis following cerebral infarction affecting right dominant side: Secondary | ICD-10-CM | POA: Diagnosis not present

## 2016-07-18 DIAGNOSIS — I639 Cerebral infarction, unspecified: Secondary | ICD-10-CM | POA: Diagnosis not present

## 2016-07-18 DIAGNOSIS — E1159 Type 2 diabetes mellitus with other circulatory complications: Secondary | ICD-10-CM | POA: Diagnosis not present

## 2016-07-18 DIAGNOSIS — F329 Major depressive disorder, single episode, unspecified: Secondary | ICD-10-CM | POA: Diagnosis not present

## 2016-07-18 DIAGNOSIS — Z794 Long term (current) use of insulin: Secondary | ICD-10-CM | POA: Diagnosis not present

## 2016-07-18 DIAGNOSIS — M6281 Muscle weakness (generalized): Secondary | ICD-10-CM | POA: Diagnosis not present

## 2016-07-18 DIAGNOSIS — E1143 Type 2 diabetes mellitus with diabetic autonomic (poly)neuropathy: Secondary | ICD-10-CM | POA: Diagnosis not present

## 2016-07-18 DIAGNOSIS — I69320 Aphasia following cerebral infarction: Secondary | ICD-10-CM | POA: Diagnosis not present

## 2016-07-18 DIAGNOSIS — I6789 Other cerebrovascular disease: Secondary | ICD-10-CM | POA: Diagnosis not present

## 2016-07-18 DIAGNOSIS — R531 Weakness: Secondary | ICD-10-CM | POA: Diagnosis not present

## 2016-07-18 DIAGNOSIS — I129 Hypertensive chronic kidney disease with stage 1 through stage 4 chronic kidney disease, or unspecified chronic kidney disease: Secondary | ICD-10-CM | POA: Diagnosis not present

## 2016-07-18 DIAGNOSIS — N182 Chronic kidney disease, stage 2 (mild): Secondary | ICD-10-CM | POA: Diagnosis not present

## 2016-07-18 DIAGNOSIS — R269 Unspecified abnormalities of gait and mobility: Secondary | ICD-10-CM | POA: Diagnosis not present

## 2016-07-18 DIAGNOSIS — E1169 Type 2 diabetes mellitus with other specified complication: Secondary | ICD-10-CM | POA: Diagnosis not present

## 2016-07-18 DIAGNOSIS — E785 Hyperlipidemia, unspecified: Secondary | ICD-10-CM | POA: Diagnosis not present

## 2016-07-18 DIAGNOSIS — R488 Other symbolic dysfunctions: Secondary | ICD-10-CM | POA: Diagnosis not present

## 2016-07-18 DIAGNOSIS — I679 Cerebrovascular disease, unspecified: Secondary | ICD-10-CM | POA: Diagnosis not present

## 2016-07-18 DIAGNOSIS — I672 Cerebral atherosclerosis: Secondary | ICD-10-CM | POA: Diagnosis not present

## 2016-07-18 DIAGNOSIS — E782 Mixed hyperlipidemia: Secondary | ICD-10-CM | POA: Diagnosis not present

## 2016-07-18 LAB — GLUCOSE, CAPILLARY
GLUCOSE-CAPILLARY: 201 mg/dL — AB (ref 65–99)
GLUCOSE-CAPILLARY: 111 mg/dL — AB (ref 65–99)
GLUCOSE-CAPILLARY: 112 mg/dL — AB (ref 65–99)

## 2016-07-18 NOTE — Progress Notes (Signed)
Subjective/Complaints: Up in chair. Just finished breakfast. Happy with his progress.   ROS: pt denies nausea, vomiting, diarrhea, cough, shortness of breath or chest pain     Objective: Vital Signs: Blood pressure (!) 167/59, pulse 65, temperature 97.8 F (36.6 C), temperature source Oral, resp. rate 18, height 5' 7"  (1.702 m), weight 109 kg (240 lb 6.4 oz), SpO2 96 %. No results found. Results for orders placed or performed during the hospital encounter of 07/04/16 (from the past 72 hour(s))  Glucose, capillary     Status: Abnormal   Collection Time: 07/15/16 11:08 AM  Result Value Ref Range   Glucose-Capillary 104 (H) 65 - 99 mg/dL   Comment 1 Notify RN   Glucose, capillary     Status: Abnormal   Collection Time: 07/15/16  4:16 PM  Result Value Ref Range   Glucose-Capillary 158 (H) 65 - 99 mg/dL   Comment 1 Notify RN   Glucose, capillary     Status: Abnormal   Collection Time: 07/15/16  9:05 PM  Result Value Ref Range   Glucose-Capillary 141 (H) 65 - 99 mg/dL  Basic metabolic panel     Status: Abnormal   Collection Time: 07/16/16  6:14 AM  Result Value Ref Range   Sodium 139 135 - 145 mmol/L   Potassium 4.6 3.5 - 5.1 mmol/L   Chloride 103 101 - 111 mmol/L   CO2 30 22 - 32 mmol/L   Glucose, Bld 103 (H) 65 - 99 mg/dL   BUN 15 6 - 20 mg/dL   Creatinine, Ser 1.07 0.61 - 1.24 mg/dL   Calcium 9.0 8.9 - 10.3 mg/dL   GFR calc non Af Amer >60 >60 mL/min   GFR calc Af Amer >60 >60 mL/min    Comment: (NOTE) The eGFR has been calculated using the CKD EPI equation. This calculation has not been validated in all clinical situations. eGFR's persistently <60 mL/min signify possible Chronic Kidney Disease.    Anion gap 6 5 - 15  CBC     Status: Abnormal   Collection Time: 07/16/16  6:14 AM  Result Value Ref Range   WBC 11.6 (H) 4.0 - 10.5 K/uL   RBC 4.28 4.22 - 5.81 MIL/uL   Hemoglobin 12.5 (L) 13.0 - 17.0 g/dL   HCT 39.6 39.0 - 52.0 %   MCV 92.5 78.0 - 100.0 fL   MCH  29.2 26.0 - 34.0 pg   MCHC 31.6 30.0 - 36.0 g/dL   RDW 12.4 11.5 - 15.5 %   Platelets 205 150 - 400 K/uL  Glucose, capillary     Status: Abnormal   Collection Time: 07/16/16  6:29 AM  Result Value Ref Range   Glucose-Capillary 116 (H) 65 - 99 mg/dL  Glucose, capillary     Status: Abnormal   Collection Time: 07/16/16 11:17 AM  Result Value Ref Range   Glucose-Capillary 161 (H) 65 - 99 mg/dL   Comment 1 Notify RN   Glucose, capillary     Status: Abnormal   Collection Time: 07/16/16  4:12 PM  Result Value Ref Range   Glucose-Capillary 130 (H) 65 - 99 mg/dL   Comment 1 Notify RN   Glucose, capillary     Status: Abnormal   Collection Time: 07/16/16  8:51 PM  Result Value Ref Range   Glucose-Capillary 139 (H) 65 - 99 mg/dL  Glucose, capillary     Status: Abnormal   Collection Time: 07/17/16  7:14 AM  Result Value Ref Range  Glucose-Capillary 134 (H) 65 - 99 mg/dL  Glucose, capillary     Status: Abnormal   Collection Time: 07/17/16 11:41 AM  Result Value Ref Range   Glucose-Capillary 111 (H) 65 - 99 mg/dL  Glucose, capillary     Status: Abnormal   Collection Time: 07/17/16  4:39 PM  Result Value Ref Range   Glucose-Capillary 159 (H) 65 - 99 mg/dL  Glucose, capillary     Status: Abnormal   Collection Time: 07/17/16  8:54 PM  Result Value Ref Range   Glucose-Capillary 201 (H) 65 - 99 mg/dL  Glucose, capillary     Status: Abnormal   Collection Time: 07/18/16  6:30 AM  Result Value Ref Range   Glucose-Capillary 111 (H) 65 - 99 mg/dL     General: No acute distress. Vital signs reviewed. Psych: Slowed Heart: RRR Lungs: CTA B Abdomen: Positive bowel sounds, soft  Skin: No evidence of breakdown, Intact Neurologic: Alert and oriented x3 cues for date of month. Still with delays in expressive language and processing---improved overall. Speech clearer too Motor: 5/5 in left deltoid, bicep, tricep, grip, hip flexor, knee extensors, ankle dorsiflexor and plantar flexor 3+ to 4-/5  minus in the right deltoid, bicep, tricep, finger flexors, hip flexor knee extensor, ankle dorsiflexor with ongoing apraxia--is activating more.  Musculoskeletal: No edema. No tenderness  Assessment/Plan: 1. Functional deficits secondary to left ACA infarct with right hemiparesis which require 3+ hours per day of interdisciplinary therapy in a comprehensive inpatient rehab setting. Physiatrist is providing close team supervision and 24 hour management of active medical problems listed below. Physiatrist and rehab team continue to assess barriers to discharge/monitor patient progress toward functional and medical goals. FIM: Function - Bathing Position: Shower Body parts bathed by patient: Chest, Abdomen, Front perineal area, Buttocks, Right upper leg, Left upper leg, Right lower leg, Left lower leg, Back, Right arm, Left arm Body parts bathed by helper: Left arm Assist Level: Supervision or verbal cues  Function- Upper Body Dressing/Undressing What is the patient wearing?: Pull over shirt/dress Pull over shirt/dress - Perfomed by patient: Thread/unthread right sleeve, Thread/unthread left sleeve, Put head through opening, Pull shirt over trunk Pull over shirt/dress - Perfomed by helper: Thread/unthread right sleeve, Thread/unthread left sleeve, Put head through opening, Pull shirt over trunk Assist Level: More than reasonable time Function - Lower Body Dressing/Undressing What is the patient wearing?: Shoes, AFO, Pants, Ted Hose, Underwear Position: Sitting EOB Underwear - Performed by patient: Thread/unthread right underwear leg, Thread/unthread left underwear leg, Pull underwear up/down Underwear - Performed by helper: Thread/unthread right underwear leg, Thread/unthread left underwear leg, Pull underwear up/down Pants- Performed by patient: Thread/unthread right pants leg, Thread/unthread left pants leg, Pull pants up/down Pants- Performed by helper: Thread/unthread right pants leg,  Thread/unthread left pants leg, Pull pants up/down Non-skid slipper socks- Performed by patient: Don/doff left sock, Don/doff right sock Non-skid slipper socks- Performed by helper: Don/doff right sock Shoes - Performed by patient: Don/doff right shoe, Don/doff left shoe, Fasten right, Fasten left AFO - Performed by patient: Don/doff right AFO TED Hose - Performed by helper: Don/doff left TED hose, Don/doff right TED hose Assist for footwear: Supervision/touching assist Assist for lower body dressing: Touching or steadying assistance (Pt > 75%)  Function - Toileting Toileting activity did not occur: No continent bowel/bladder event (urinal) Toileting steps completed by patient: Adjust clothing prior to toileting, Performs perineal hygiene, Adjust clothing after toileting Toileting steps completed by helper: Adjust clothing prior to toileting, Adjust clothing after toileting  Toileting Assistive Devices:  (Pt used urinal kept at bedside.) Assist level: Set up/obtain supplies  Function - Air cabin crew transfer activity did not occur: Refused Toilet transfer assistive device: Elevated toilet seat/BSC over toilet, Grab bar, Cane Assist level to toilet: No Help, no cues, assistive device, takes more than a reasonable amount of time Assist level from toilet: No Help, no cues, assistive device, takes more than a reasonable amount of time  Function - Chair/bed transfer Chair/bed transfer method: Squat pivot Chair/bed transfer assist level: No Help, no cues, assistive device, takes more than a reasonable amount of time Chair/bed transfer assistive device: Cane Chair/bed transfer details: Verbal cues for technique  Function - Locomotion: Wheelchair Will patient use wheelchair at discharge?: No Type: Manual Max wheelchair distance: 150 Assist Level: Supervision or verbal cues Wheel 50 feet with 2 turns activity did not occur: Safety/medical concerns Assist Level: Supervision or verbal  cues Wheel 150 feet activity did not occur: Safety/medical concerns Assist Level: Supervision or verbal cues Turns around,maneuvers to table,bed, and toilet,negotiates 3% grade,maneuvers on rugs and over doorsills: No Function - Locomotion: Ambulation Assistive device: Cane-quad Max distance: 100 Assist level: Supervision or verbal cues Assist level: Supervision or verbal cues Walk 50 feet with 2 turns activity did not occur: Safety/medical concerns Assist level: Supervision or verbal cues Walk 150 feet activity did not occur: Safety/medical concerns Assist level: Supervision or verbal cues Walk 10 feet on uneven surfaces activity did not occur: Safety/medical concerns Assist level: Touching or steadying assistance (Pt > 75%)  Function - Comprehension Comprehension: Auditory Comprehension assist level: Follows basic conversation/direction with extra time/assistive device  Function - Expression Expression: Verbal Expression assist level: Expresses basic 75 - 89% of the time/requires cueing 10 - 24% of the time. Needs helper to occlude trach/needs to repeat words.  Function - Social Interaction Social Interaction assist level: Interacts appropriately 90% of the time - Needs monitoring or encouragement for participation or interaction.  Function - Problem Solving Problem solving assist level: Solves basic 75 - 89% of the time/requires cueing 10 - 24% of the time  Function - Memory Memory assist level: Recognizes or recalls 75 - 89% of the time/requires cueing 10 - 24% of the time Patient normally able to recall (first 3 days only): Staff names and faces, That he or she is in a hospital, Current season, Location of own room  Medical Problem List and Plan: 1. 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 Letta Pate in 3-4 weeks 2. DVT Prophylaxis/Anticoagulation: Subcutaneous Lovenox. Monitor platelet counts and any signs of bleeding 3. Pain Management: Tylenol as needed 4. Mood: Zoloft 100 mg daily 5. Neuropsych: This patient iscapable of making decisions on hisown behalf. 6. Skin/Wound Care: Routine skin checks 7. Fluids/Electrolytes/Nutrition:  Excellent PO intake 8.Diabetes mellitus of peripheral neuropathy. Hemoglobin A1c 6.9.Levemir 30 units daily. Check blood sugars before meals and at bedtime. Diabetic teaching   added metformin 500 mg 2/5 with breakfast, increased to 1052m, reduced Levemir 27 units  -will need further adjustment once home 9.CRI stage II.   -BUN Cr stable 2/14 10.Hypertension. Patient on Coreg 25 mg twice a day, HCTZ 25 mg daily, lisinopril 40 mg daily, Aldactone 50 mg daily, Norvasc 10 mg daily, Revatio 60 mg daily prior  to admission.   Resumed amlodipine 28m  HCTZ restarted 2/10, changed to chlorthalidone on 2/11  -improved control overall 11.Hyperlipidemia. Lipitor 12.Question medical compliance. Provide education 13. Leukocytosis: down to 11.6 today  -no signs of infection 14. Hypoalbuminemia. Moderate, pro-stat 15. Malodorous urine/incontinence: follow up ucx with multispecies  LOS (Days) 14 A FACE TO FACE EVALUATION WAS PERFORMED  Najma Bozarth T 07/18/2016, 9:49 AM

## 2016-07-18 NOTE — Progress Notes (Signed)
Report was called to [facility] to [place].Pt. Is ready to be transported to facility.

## 2016-07-18 NOTE — Progress Notes (Signed)
Social Work  Discharge Note  The overall goal for the admission was met for:   Discharge location: [place]  Length of Stay: Yes-14 DAYS  Discharge activity level: Yes-SUPERVISION-MIN ASSIST LEVEL  Home/community participation: Yes  Services provided included: MD, RD, PT, OT, SLP, RN, CM, Pharmacy and SW  Financial Services: Medicare  Follow-up services arranged: Other: SHORT TERM NHP  Comments (or additional information):[name] AND EX-WIFE-BRENDA FEEL HE NEEDS TO GO TO A NH TO GET HIGHER LEVEL BEFORE GOING HOME ALONE. PT IS STUBBORN AND DOES NOT ALWAYS DO WHAT IS BEST FOR HIS HEALTH. HE IS AGREEABLE AND FEELS NEEDS MORE THERAPY BEFORE GOING HOME. ENCOURAGED DAUGHTER TO APPLY FOR MEDICAID FOR HIM.  Patient/Family verbalized understanding of follow-up arrangements: Yes  Individual responsible for coordination of the follow-up plan: [name]-DAUGHTER   Confirmed correct DME delivered: Elease Hashimoto 07/18/2016    Elease Hashimoto

## 2016-07-18 NOTE — Plan of Care (Signed)
Problem: RH BLADDER ELIMINATION Goal: RH STG MANAGE BLADDER WITH ASSISTANCE STG Manage Bladder With .Assistance- Mod I   Outcome: Progressing Pt able to use the urinal and call for assistance for emptying.  Pt having no incontinent episodes.

## 2016-07-18 NOTE — Progress Notes (Signed)
Social Work Patient ID: [name], male   DOB: 01/18/1951, 65 y.o.   MRN: [id number]  Met with pt and spoke with Rhonda-Admissions at [facility] she is meeting with his daughter at 10:00 would like pt after Lunch. Made RN and pt aware of this.   

## 2016-07-22 ENCOUNTER — Non-Acute Institutional Stay (SKILLED_NURSING_FACILITY): Payer: Medicare Other | Admitting: Internal Medicine

## 2016-07-22 DIAGNOSIS — E785 Hyperlipidemia, unspecified: Secondary | ICD-10-CM

## 2016-07-22 DIAGNOSIS — E1169 Type 2 diabetes mellitus with other specified complication: Secondary | ICD-10-CM

## 2016-07-22 DIAGNOSIS — E1159 Type 2 diabetes mellitus with other circulatory complications: Secondary | ICD-10-CM

## 2016-07-22 DIAGNOSIS — I152 Hypertension secondary to endocrine disorders: Secondary | ICD-10-CM

## 2016-07-22 DIAGNOSIS — I10 Essential (primary) hypertension: Secondary | ICD-10-CM

## 2016-07-22 DIAGNOSIS — I639 Cerebral infarction, unspecified: Secondary | ICD-10-CM

## 2016-07-22 DIAGNOSIS — R269 Unspecified abnormalities of gait and mobility: Secondary | ICD-10-CM

## 2016-07-22 NOTE — Progress Notes (Signed)
07/22/16  Facility; [facility]  Chief complaint; admission to SNF post admit to [facility] hospital from 1/29 through 2/2 and then at rehabilitation from 2/2 through 07/18/2016  History; this patient has had a prior left hemisphere stroke and apparently has chronic right-sided hemiparesis. He was already on aspirin and Plavix. He was apparently found at home with dysarthria, G and falls along with diarrhea. Apparently the dysarthria was new and an MRI suggested an acute CVA in the left frontal lobe area. An MRI of the brain showed an acute infarct in the left ACA distribution. A CT angiogram did not show significant stenosis however it did show occlusion near the origin of the left anterior cerebral artery. An echocardiogram did not show an embolic source. He had good left and right ventricular function. Telemetry was negative for A. fib. He was seen by the stroke service and recommended to continue on aspirin and Plavix for 3 months and then Plavix alone. His hemoglobin A1c was 6.9 LDL cholesterol was 60. His mental status improved and was felt to be secondary to the CVA dehydration and gastroenteritis  The patient was on rehabilitation medicine from 2/232/16. At discharge she was minimal assist for 30 feet with his quad cane in his left arm. He requires minimal to mod assist for activities of daily living. The patient states he lived alone prior to this. He states he has a daughter and son in the area. His exact functional status is not completely clear.  BMP Latest Ref Rng & Units 07/16/2016 07/11/2016 07/07/2016  Glucose 65 - 99 mg/dL 103(H) - 101(H)  BUN 6 - 20 mg/dL 15 - 13  Creatinine 0.61 - 1.24 mg/dL 1.07 1.12 0.97  Sodium 135 - 145 mmol/L 139 - 137  Potassium 3.5 - 5.1 mmol/L 4.6 - 4.1  Chloride 101 - 111 mmol/L 103 - 106  CO2 22 - 32 mmol/L 30 - 28  Calcium 8.9 - 10.3 mg/dL 9.0 - 8.3(L)     Past Medical History:  Diagnosis Date  . 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 CVA (cerebral infarction) 01/03/2008   MRI: Acute 1 x 1.5 cm infarction affecting the left side of the pons.  . Diabetes mellitus   . DVT (deep venous thrombosis) ([place])   . Hypertension   . PAD (peripheral artery disease) ([place])   . Stroke [facility])    2008    Current medications as per the rehabilitation discharge summary include  Norvasc 5 daily Aspirin 325 daily Lipitor 40 daily Plavix 75 daily Chlorthalidone 25 mg daily Levemir 27 units daily Glucophage 1000 daily Zoloft 100 daily Tylenol when necessary.  Social History   Social History  . Marital status: Divorced    Spouse name: N/A  . Number of children: 3  . Years of education: N/A   Occupational History  . Disabled-security guard [place] History Main Topics  . Smoking status: Never Smoker  . Smokeless tobacco: Never Used  . Alcohol use No  . Drug use: No  . Sexual activity: No   Other Topics Concern  . Not on file   Social History Narrative   Divorced   2 sons, 1 daughter   Retired/disabled   No caffeine   12/24/2015         family history includes Diabetes in his mother and sister; Heart attack in his mother; Hypertension in his brother, daughter, mother, sister, and son. There is no history of  Colon cancer, Esophageal cancer, Stomach cancer, or Rectal cancer.  Review of systems Gen. patient states that he feels well HEENT denies any swallowing difficulties although he seems to have trouble with saliva Respiratory no shortness of breath Cardiac no chest pain GI known but no abdominal pain diarrhea GU no dysuria Neurologic; states his right arm greater than leg weakness is stable for him but the language difficulties are new. That would seem verified by the medical record.  Physical examination Gen. patient is able to walk and bring himself into the bathroom with a quad complain in the left arm. HEENT oral exam is normal. There is no visual field defects Respiratory  clear entry bilaterally Cardiac heart sounds are normal no murmurs no carotid bruits Abdomen somewhat obese no liver no spleen no tenderness GU bladder is not distended Neurologic; cranial nerves; visual fields are intact, extraocular movements are intact. Cranial nerve V seems normal.. Motor right arm weakness he is able to ambulate with a quad cane. Mental status; cognition seems intact  Impression/plan #1 acute on chronic left hemisphere stroke most recently in the anterior cerebral artery distribution. He apparently was already on Plavix and aspirin. The discharge summary from the hospital service to continue dual antiplatelet therapy for 3 months and then Plavix alone. #2 type 2 diabetes on insulin ankle cuff rash. His hemoglobin A1c was 6.9 line #3 hyperlipidemia on Lipitor. His LDL cholesterol was 70 #3 hypertension on Norvasc, chlorthalidone #4 depression he was on Zoloft PTA this seems stable now. #5 the patient appears determined that he is able to go home and live in his independent setting. I am really not certain of his exact functional status currently. He was not independent with ADLs prior to leaving rehabilitation. He'll be nice to verify with his family what he was like before this and how much assistance in ADLs and IDL's he was actually receiving. #6 his medical status actually seems fairly stable  and multiple falls with concern for acute stroke.  EXAM: MRI HEAD WITHOUT CONTRAST  TECHNIQUE: Multiplanar, multiecho pulse sequences of the brain and surrounding structures were obtained without intravenous contrast.  COMPARISON:  06/29/2016 CT head.  01/03/2008 MRI head.  FINDINGS: Brain: Diffusion restriction within the left aspect of corpus callosum extending into left cingulate gyrus in paramedian frontal lobe compatible with acute/ early subacute infarction.  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 Chronic infarct within the left hemi pons. Moderate diffuse supratentorial and infratentorial brain parenchymal volume loss. Background of mild chronic microvascular ischemic changes in white matter. No hydrocephalus. No extra-axial collection. No abnormal susceptibility hypointensity to indicate intracranial hemorrhage.  Vascular: Normal flow voids.  Skull and upper cervical spine: Normal marrow signal.  Sinuses/Orbits: Mild diffuse paranasal sinus mucosal thickening greatest in the maxillary sinuses. No abnormal signal of the mastoid air cells. Orbits are unremarkable.  Other: None.  IMPRESSION: 1. Diffusion restriction within the left aspect of corpus callosum extending into left cingulate gyrus in paramedian frontal lobe compatible with acute/ early subacute infarction, left ACA distribution. No acute hemorrhage identified. 2. Mild chronic microvascular ischemic changes and moderate diffuse brain parenchymal volume loss. Chronic left pons infarct. 3. Mild diffuse paranasal sinus disease. These results will be called to the ordering clinician or representative by the Radiologist Assistant, and communication documented in the PACS or zVision Dashboard.   Electronically Signed   By: Edgardo Roys.D.  On: 07/01/2016 14:57

## 2016-07-24 ENCOUNTER — Telehealth: Payer: Self-pay | Admitting: *Deleted

## 2016-07-24 NOTE — Telephone Encounter (Signed)
He needs to use the AFO only when out of bed. He needs it for transfers as well as ambulating in therapy

## 2016-07-24 NOTE — Telephone Encounter (Signed)
[facility] notified.

## 2016-07-24 NOTE — Telephone Encounter (Signed)
Jenny Reichmann called from [facility] and they need information about how MR Shugart is supposed to use his AFO.  Please advise.

## 2016-08-06 ENCOUNTER — Other Ambulatory Visit: Payer: Self-pay | Admitting: Family Medicine

## 2016-08-06 ENCOUNTER — Encounter: Payer: Self-pay | Admitting: Nurse Practitioner

## 2016-08-06 ENCOUNTER — Non-Acute Institutional Stay (SKILLED_NURSING_FACILITY): Payer: Medicare Other | Admitting: Nurse Practitioner

## 2016-08-06 DIAGNOSIS — E782 Mixed hyperlipidemia: Secondary | ICD-10-CM

## 2016-08-06 DIAGNOSIS — E1159 Type 2 diabetes mellitus with other circulatory complications: Secondary | ICD-10-CM

## 2016-08-06 DIAGNOSIS — I639 Cerebral infarction, unspecified: Secondary | ICD-10-CM | POA: Diagnosis not present

## 2016-08-06 DIAGNOSIS — I10 Essential (primary) hypertension: Secondary | ICD-10-CM | POA: Diagnosis not present

## 2016-08-06 MED ORDER — "INSULIN SYRINGE-NEEDLE U-100 28G X 1/2"" 1 ML MISC": 1.0000 | Freq: Three times a day (TID) | 11 refills | Status: DC

## 2016-08-06 MED ORDER — INSULIN PEN NEEDLE 31G X 8 MM MISC: 0 refills | Status: DC

## 2016-08-06 MED ORDER — CHLORTHALIDONE 25 MG PO TABS: 25.0000 mg | ORAL_TABLET | Freq: Every day | ORAL | 0 refills | Status: DC

## 2016-08-06 MED ORDER — AMLODIPINE BESYLATE 10 MG PO TABS: 10.0000 mg | ORAL_TABLET | Freq: Every day | ORAL | 0 refills | Status: DC

## 2016-08-06 MED ORDER — METFORMIN HCL 1000 MG PO TABS: 1000.0000 mg | ORAL_TABLET | Freq: Every day | ORAL | 0 refills | Status: DC

## 2016-08-06 MED ORDER — ATORVASTATIN CALCIUM 40 MG PO TABS: 40.0000 mg | ORAL_TABLET | Freq: Every day | ORAL | 0 refills | Status: DC

## 2016-08-06 MED ORDER — SIDEKICK BLOOD GLUCOSE SYSTEM DEVI: 3 refills | Status: DC

## 2016-08-06 MED ORDER — SERTRALINE HCL 100 MG PO TABS: 100.0000 mg | ORAL_TABLET | Freq: Every day | ORAL | 0 refills | Status: DC

## 2016-08-06 MED ORDER — INSULIN DETEMIR 100 UNIT/ML ~~LOC~~ SOLN: 27.0000 [IU] | Freq: Every day | SUBCUTANEOUS | 0 refills | Status: DC

## 2016-08-06 MED ORDER — CLOPIDOGREL BISULFATE 75 MG PO TABS: ORAL_TABLET | ORAL | 0 refills | Status: DC

## 2016-08-06 NOTE — Progress Notes (Signed)
Nursing Home Location:  [facility]   Place of Service: SNF (31)  PCP: Lockie Pares, MD  No Known Allergies  Chief Complaint  Patient presents with  . Discharge Note    Resident is discharging from SNF.     HPI:  Patient is a 66 y.o. male seen today at [facility] for rehabilitation after CVA. Pt was admitted to [facility] from 1/29 through 2/2 and then at inpatient rehabilitation from 2/2 through 07/18/2016, pt transferred to [facility] for ongoing rehab.  pt with a hx of prior left hemisphere stroke and apparently has chronic right-sided hemiparesis. Pt was already on aspirin and Plavix. He was apparently found at home with dysarthria and falls. Apparently the dysarthria was new and an MRI suggested an acute CVA in the left frontal lobe area. An MRI of the brain showed an acute infarct in the left ACA distribution. A CT angiogram did not show significant stenosis however it did show occlusion near the origin of the left anterior cerebral artery. An echocardiogram did not show an embolic source. He had good left and right ventricular function. Telemetry was negative for A. fib. He was seen by the stroke service and recommended to continue on aspirin and Plavix for 3 months and then Plavix alone.  Patient currently doing well with therapy, now stable to discharge home with home health.  Review of Systems:  Review of Systems  Constitutional: Negative for activity change, appetite change, fatigue and unexpected weight change.  HENT: Negative for congestion, hearing loss and trouble swallowing.   Eyes: Negative.   Respiratory: Negative for cough and shortness of breath.   Cardiovascular: Negative for chest pain, palpitations and leg swelling.  Gastrointestinal: Negative for abdominal pain, constipation and diarrhea.  Genitourinary: Negative for difficulty urinating and dysuria.  Musculoskeletal: Negative for arthralgias and myalgias.  Skin: Negative for  color change and wound.  Neurological: Positive for weakness (right sided weakness). Negative for dizziness.  Psychiatric/Behavioral: Negative for agitation, behavioral problems and confusion.    Past Medical History:  Diagnosis Date  . 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 CVA (cerebral infarction) 01/03/2008   MRI: Acute 1 x 1.5 cm infarction affecting the left side of the pons.  . Diabetes mellitus   . DVT (deep venous thrombosis) ([place])   . Hypertension   . PAD (peripheral artery disease) ([place])   . Stroke [facility])    2008   Past Surgical History:  Procedure Laterality Date  . None     Social History:   reports that he has never smoked. He has never used smokeless tobacco. He reports that he does not drink alcohol or use drugs.  Family History  Problem Relation Age of Onset  . Diabetes Mother   . Heart attack Mother   . Hypertension Mother   . Diabetes Sister   . Hypertension Sister   . Hypertension Brother   . Hypertension Daughter   . Hypertension Son   . Colon cancer Neg Hx   . Esophageal cancer Neg Hx   . Stomach cancer Neg Hx   . Rectal cancer Neg Hx     Medications: Patient's Medications  New Prescriptions   No medications on file  Previous Medications   ACETAMINOPHEN (TYLENOL) 325 MG TABLET    Take 650 mg by mouth every 6 (six) hours as needed.   AMLODIPINE (NORVASC) 10 MG TABLET    Take 10 mg by mouth daily.   ASPIRIN 325  MG TABLET    Take 1 tablet (325 mg total) by mouth daily.   ATORVASTATIN (LIPITOR) 40 MG TABLET    Take 1 tablet (40 mg total) by mouth daily.   B-D ULTRAFINE III SHORT PEN 31G X 8 MM MISC    USE WITH LEVEMIR   BLOOD GLUC METER DISP-STRIPS (SIDEKICK BLOOD GLUCOSE SYSTEM) DEVI    Use for daily testing   CHLORTHALIDONE (HYGROTON) 25 MG TABLET    Take 25 mg by mouth daily.   CLOPIDOGREL (PLAVIX) 75 MG TABLET    TAKE 1 TABLET (75 MG TOTAL) BY MOUTH DAILY.   INSULIN DETEMIR (LEVEMIR) 100 UNIT/ML INJECTION    Inject 27 Units into  the skin at bedtime.   INSULIN SYRINGE-NEEDLE U-100 28G X 1/2" 1 ML MISC    1 each by Does not apply route 3 (three) times daily.   METFORMIN (GLUCOPHAGE) 1000 MG TABLET    Take 1,000 mg by mouth daily.   SERTRALINE (ZOLOFT) 100 MG TABLET    TAKE 1 TABLET BY MOUTH EVERY DAY  Modified Medications   No medications on file  Discontinued Medications   BACLOFEN (LIORESAL) 10 MG TABLET    TAKE 1/2 TABLET BY MOUTH 1 time a DAY AS NEEDED FOR MUSCLE SPASMS   CARVEDILOL (COREG) 25 MG TABLET    TAKE 1 TABLET (25 MG TOTAL) BY MOUTH 2 (TWO) TIMES DAILY WITH A MEAL.   HYDROCHLOROTHIAZIDE (HYDRODIURIL) 25 MG TABLET    TAKE 1 TABLET (25 MG TOTAL) BY MOUTH DAILY.   INSULIN ASPART (NOVOLOG FLEXPEN) 100 UNIT/ML FLEXPEN    Inject 15 Units into the skin 2 (two) times daily with a meal.   LEVEMIR FLEXTOUCH 100 UNIT/ML PEN    INJECT 30 UNITS INTO THE SKIN DAILY   LISINOPRIL (PRINIVIL,ZESTRIL) 40 MG TABLET    TAKE 1 TABLET (40 MG TOTAL) BY MOUTH DAILY.   SPIRONOLACTONE (ALDACTONE) 50 MG TABLET    TAKE 1 TABLET (50 MG TOTAL) BY MOUTH DAILY.     Physical Exam: Vitals:   08/06/16 1049  BP: 127/65  Pulse: 67  Resp: 16  Temp: 98.2 F (36.8 C)  SpO2: 90%  Weight: 234 lb 6.4 oz (106.3 kg)  Height: 5\' 7"  (1.702 m)    Physical Exam  Constitutional: He is oriented to person, place, and time. He appears well-developed and well-nourished. No distress.  HENT:  Head: Normocephalic and atraumatic.  Mouth/Throat: Oropharynx is clear and moist. No oropharyngeal exudate.  Eyes: Conjunctivae and EOM are normal. Pupils are equal, round, and reactive to light.  Neck: Normal range of motion. Neck supple.  Cardiovascular: Normal rate, regular rhythm and normal heart sounds.   Pulmonary/Chest: Effort normal and breath sounds normal.  Abdominal: Soft. Bowel sounds are normal.  Musculoskeletal: He exhibits no edema or tenderness.  Right sided weakness, ambulates with quad cane.  Neurological: He is alert and oriented to  person, place, and time.  Skin: Skin is warm and dry. He is not diaphoretic.  Psychiatric: He has a normal mood and affect.    Labs reviewed: Basic Metabolic Panel:  Recent Labs  07/03/16 0345  07/07/16 0640 07/11/16 0410 07/16/16 0614  NA 138  --  137  --  139  K 3.9  --  4.1  --  4.6  CL 107  --  106  --  103  CO2 22  --  28  --  30  GLUCOSE 117*  --  101*  --  103*  BUN  10  --  13  --  15  CREATININE 0.86  < > 0.97 1.12 1.07  CALCIUM 8.4*  --  8.3*  --  9.0  < > = values in this interval not displayed. Liver Function Tests:  Recent Labs  06/29/16 2346 07/07/16 0640  AST 70* 36  ALT 122* 46  ALKPHOS 89 50  BILITOT 0.9 0.9  PROT 6.9 5.7*  ALBUMIN 3.7 2.4*   No results for input(s): LIPASE, AMYLASE in the last 8760 hours. No results for input(s): AMMONIA in the last 8760 hours. CBC:  Recent Labs  07/01/16 0522 07/03/16 0345 07/04/16 1846 07/07/16 0640 07/16/16 0614  WBC 15.7* 11.4* 15.7* 12.5* 11.6*  NEUTROABS 12.0* 7.7  --  8.3*  --   HGB 10.7* 11.2* 12.1* 11.2* 12.5*  HCT 32.9* 34.6* 36.9* 34.9* 39.6  MCV 91.9 91.5 91.3 91.4 92.5  PLT 124* 148* 154 159 205   TSH: No results for input(s): TSH in the last 8760 hours. A1C: Lab Results  Component Value Date   HGBA1C 6.9 (H) 07/03/2016   Lipid Panel:  Recent Labs  09/25/15 1149 07/04/16 0322  CHOL 131 91  HDL 41 22*  LDLCALC 79 60  TRIG 53 43  CHOLHDL 3.2 4.1    .    Assessment/Plan 1. Cerebrovascular accident (CVA) involving left cerebral hemisphere ([place]) Stable, doing well with rehab. Pt to be on plavix and ASA for 3 months then plavix alone.   2. Type 2 diabetes mellitus with other circulatory complications (CODE) ([place]) novolog was stopped prior to admission to [facility] blood sugars fasting ranging form 110-199, 1 reading of 319. Will cont levemir 27 units at this time and metformin 1000 mg daily. PCP to  follow up.   3. Essential hypertension, benign Medications adjusted during  inpatient rehab, norvasc increased from 5 mg to 10mg  while at [facility]. Blood pressure stable on norvasc 10 mg daily and chlorthalidone at this time. will cont at this time   4. Hyperlipidemia LDL at goal <60, conts on Lipitor 40 mg daily   pt is stable for discharge-will need PT/OT/Nursing per home health. No DME needed. Rx written.  will need to follow up with PCP within 2 weeks.   Carlos American. Harle Battiest  [facility] [phone] 8 am - 5 pm) [phone] (after hours)

## 2016-08-10 DIAGNOSIS — E1142 Type 2 diabetes mellitus with diabetic polyneuropathy: Secondary | ICD-10-CM | POA: Diagnosis not present

## 2016-08-10 DIAGNOSIS — I10 Essential (primary) hypertension: Secondary | ICD-10-CM | POA: Diagnosis not present

## 2016-08-10 DIAGNOSIS — E782 Mixed hyperlipidemia: Secondary | ICD-10-CM | POA: Diagnosis not present

## 2016-08-10 DIAGNOSIS — I639 Cerebral infarction, unspecified: Secondary | ICD-10-CM | POA: Diagnosis not present

## 2016-08-10 DIAGNOSIS — I69351 Hemiplegia and hemiparesis following cerebral infarction affecting right dominant side: Secondary | ICD-10-CM | POA: Diagnosis not present

## 2016-08-10 DIAGNOSIS — E1151 Type 2 diabetes mellitus with diabetic peripheral angiopathy without gangrene: Secondary | ICD-10-CM | POA: Diagnosis not present

## 2016-08-10 DIAGNOSIS — E1159 Type 2 diabetes mellitus with other circulatory complications: Secondary | ICD-10-CM | POA: Diagnosis not present

## 2016-08-10 DIAGNOSIS — I69320 Aphasia following cerebral infarction: Secondary | ICD-10-CM | POA: Diagnosis not present

## 2016-08-10 DIAGNOSIS — I129 Hypertensive chronic kidney disease with stage 1 through stage 4 chronic kidney disease, or unspecified chronic kidney disease: Secondary | ICD-10-CM | POA: Diagnosis not present

## 2016-08-10 DIAGNOSIS — N182 Chronic kidney disease, stage 2 (mild): Secondary | ICD-10-CM | POA: Diagnosis not present

## 2016-08-12 ENCOUNTER — Telehealth: Payer: Self-pay

## 2016-08-12 NOTE — Telephone Encounter (Signed)
Tiffany with [facility] called requesting verbal orders for the following.   1) Medical management 2 times per week for  3 weeks.   2) Medication management 2 times per week for 3 weeks.   3) Skilled nursing once weekly for 4 weeks.   4) Speech and social work evaluations.  Verbal orders were given and Tiffany was informed that any future order would have to come from patient's PCP because patient has been discharged from SNF.

## 2016-08-13 DIAGNOSIS — I69320 Aphasia following cerebral infarction: Secondary | ICD-10-CM | POA: Diagnosis not present

## 2016-08-13 DIAGNOSIS — I69351 Hemiplegia and hemiparesis following cerebral infarction affecting right dominant side: Secondary | ICD-10-CM | POA: Diagnosis not present

## 2016-08-13 DIAGNOSIS — E1142 Type 2 diabetes mellitus with diabetic polyneuropathy: Secondary | ICD-10-CM | POA: Diagnosis not present

## 2016-08-13 DIAGNOSIS — I129 Hypertensive chronic kidney disease with stage 1 through stage 4 chronic kidney disease, or unspecified chronic kidney disease: Secondary | ICD-10-CM | POA: Diagnosis not present

## 2016-08-13 DIAGNOSIS — E1151 Type 2 diabetes mellitus with diabetic peripheral angiopathy without gangrene: Secondary | ICD-10-CM | POA: Diagnosis not present

## 2016-08-13 DIAGNOSIS — N182 Chronic kidney disease, stage 2 (mild): Secondary | ICD-10-CM | POA: Diagnosis not present

## 2016-08-14 DIAGNOSIS — E1142 Type 2 diabetes mellitus with diabetic polyneuropathy: Secondary | ICD-10-CM | POA: Diagnosis not present

## 2016-08-14 DIAGNOSIS — I129 Hypertensive chronic kidney disease with stage 1 through stage 4 chronic kidney disease, or unspecified chronic kidney disease: Secondary | ICD-10-CM | POA: Diagnosis not present

## 2016-08-14 DIAGNOSIS — I69351 Hemiplegia and hemiparesis following cerebral infarction affecting right dominant side: Secondary | ICD-10-CM | POA: Diagnosis not present

## 2016-08-14 DIAGNOSIS — N182 Chronic kidney disease, stage 2 (mild): Secondary | ICD-10-CM | POA: Diagnosis not present

## 2016-08-14 DIAGNOSIS — I69320 Aphasia following cerebral infarction: Secondary | ICD-10-CM | POA: Diagnosis not present

## 2016-08-14 DIAGNOSIS — E1151 Type 2 diabetes mellitus with diabetic peripheral angiopathy without gangrene: Secondary | ICD-10-CM | POA: Diagnosis not present

## 2016-08-18 ENCOUNTER — Ambulatory Visit (HOSPITAL_BASED_OUTPATIENT_CLINIC_OR_DEPARTMENT_OTHER): Payer: Medicare Other | Admitting: Physical Medicine & Rehabilitation

## 2016-08-18 ENCOUNTER — Encounter: Payer: Medicare Other | Attending: Physical Medicine & Rehabilitation

## 2016-08-18 ENCOUNTER — Encounter: Payer: Self-pay | Admitting: Physical Medicine & Rehabilitation

## 2016-08-18 VITALS — BP 155/80 | HR 84

## 2016-08-18 DIAGNOSIS — E1122 Type 2 diabetes mellitus with diabetic chronic kidney disease: Secondary | ICD-10-CM | POA: Diagnosis not present

## 2016-08-18 DIAGNOSIS — Z794 Long term (current) use of insulin: Secondary | ICD-10-CM | POA: Insufficient documentation

## 2016-08-18 DIAGNOSIS — G8111 Spastic hemiplegia affecting right dominant side: Secondary | ICD-10-CM | POA: Diagnosis not present

## 2016-08-18 DIAGNOSIS — N189 Chronic kidney disease, unspecified: Secondary | ICD-10-CM | POA: Insufficient documentation

## 2016-08-18 DIAGNOSIS — N172 Acute kidney failure with medullary necrosis: Secondary | ICD-10-CM | POA: Diagnosis not present

## 2016-08-18 DIAGNOSIS — I739 Peripheral vascular disease, unspecified: Secondary | ICD-10-CM | POA: Insufficient documentation

## 2016-08-18 DIAGNOSIS — Z833 Family history of diabetes mellitus: Secondary | ICD-10-CM | POA: Diagnosis not present

## 2016-08-18 DIAGNOSIS — T8611 Kidney transplant rejection: Secondary | ICD-10-CM | POA: Diagnosis not present

## 2016-08-18 DIAGNOSIS — Z8249 Family history of ischemic heart disease and other diseases of the circulatory system: Secondary | ICD-10-CM | POA: Insufficient documentation

## 2016-08-18 DIAGNOSIS — X58XXXA Exposure to other specified factors, initial encounter: Secondary | ICD-10-CM | POA: Insufficient documentation

## 2016-08-18 DIAGNOSIS — I69322 Dysarthria following cerebral infarction: Secondary | ICD-10-CM | POA: Insufficient documentation

## 2016-08-18 DIAGNOSIS — Z86718 Personal history of other venous thrombosis and embolism: Secondary | ICD-10-CM | POA: Diagnosis not present

## 2016-08-18 DIAGNOSIS — G8110 Spastic hemiplegia affecting unspecified side: Secondary | ICD-10-CM

## 2016-08-18 DIAGNOSIS — I639 Cerebral infarction, unspecified: Secondary | ICD-10-CM

## 2016-08-18 DIAGNOSIS — I129 Hypertensive chronic kidney disease with stage 1 through stage 4 chronic kidney disease, or unspecified chronic kidney disease: Secondary | ICD-10-CM | POA: Insufficient documentation

## 2016-08-18 NOTE — Progress Notes (Signed)
Subjective:    Patient ID: [name], male    DOB: [birth year], 66 y.o.   MRN: [id number]  HPI Charge from [facility] 08/06/2016, has had some elevated blood sugars up to 405 at 8:00 last night. On Metformin 1000mg  BID Levimir 27U qhs Novalog meal coverage   Needs lancet device for finger stick PT, OT, RN, SLP is scheduled  Received Dysport injection 07/10/2016 with good results to the following muscles Right Biceps 400 units FCR, 200 units. FDS 200 units. Pronator teres 100 units. FPL, 100 units  Pain Inventory Average Pain 0 Pain Right Now 0 My pain is .  In the last 24 hours, has pain interfered with the following? General activity 0 Relation with others 0 Enjoyment of life 0 What TIME of day is your pain at its worst? . Sleep (in general) .  Pain is worse with: . Pain improves with: . Relief from Meds: .  Mobility walk with assistance use a cane ability to climb steps?  yes do you drive?  no  Function not employed: date last employed .  Neuro/Psych bladder control problems weakness numbness trouble walking  Prior Studies Any changes since last visit?  no  Physicians involved in your care Any changes since last visit?  no   Family History  Problem Relation Age of Onset  . Diabetes Mother   . Heart attack Mother   . Hypertension Mother   . Diabetes Sister   . Hypertension Sister   . Hypertension Brother   . Hypertension Daughter   . Hypertension Son   . Colon cancer Neg Hx   . Esophageal cancer Neg Hx   . Stomach cancer Neg Hx   . Rectal cancer Neg Hx    Social History   Social History  . Marital status: Divorced    Spouse name: N/A  . Number of children: 3  . Years of education: N/A   Occupational History  . 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 Smoking status: Never Smoker  . Smokeless tobacco: Never Used  . Alcohol use No  . Drug use: No  . Sexual activity: No   Other Topics  Concern  . Not on file   Social History Narrative   Divorced   2 sons, 1 daughter   Retired/disabled   No caffeine   12/24/2015      Past Surgical History:  Procedure Laterality Date  . None     Past Medical History:  Diagnosis Date  . 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 CVA (cerebral infarction) 01/03/2008   MRI: Acute 1 x 1.5 cm infarction affecting the left side of the pons.  . Diabetes mellitus   . DVT (deep venous thrombosis) ([place])   . Hypertension   . PAD (peripheral artery disease) ([place])   . Stroke [facility])    2008   There were no vitals taken for this visit.  Opioid Risk Score:   Fall Risk Score:  `1  Depression screen PHQ 2/9  Depression screen [facility] 2/9 11/30/2015 11/05/2015 10/23/2015 09/25/2015 05/02/2014 05/09/2013  Decreased Interest 0 0 0 0 0 0  Down, Depressed, Hopeless 0 0 0 0 - 0  PHQ - 2 Score 0 0 0 0 0 0    Review of Systems  Constitutional: Negative.   HENT: Negative.   Eyes: Negative.   Respiratory: Negative.   Cardiovascular: Negative.   Gastrointestinal: Negative.   Endocrine: Negative.   Genitourinary:  Positive for difficulty urinating.  Musculoskeletal: Negative.   Skin: Negative.   Allergic/Immunologic: Negative.   Hematological: Negative.   Psychiatric/Behavioral: Negative.   All other systems reviewed and are negative.      Objective:   Physical Exam  Constitutional: He is oriented to person, place, and time. He appears well-developed and well-nourished.  HENT:  Head: Normocephalic and atraumatic.  Eyes: Conjunctivae and EOM are normal. Pupils are equal, round, and reactive to light.  Neck: Normal range of motion.  Neurological: He is alert and oriented to person, place, and time.  Speech is severely dysarthric  Tone is increased right upper extremity, Ashworth grade 2 at the finger flexors 2 at the biceps. This is improved from a 3  Psychiatric: He has a normal mood and affect.  Nursing note and vitals  reviewed.   2 minus in the left deltoid, bicep, tricep trace, finger extensors, 2 minus, finger flexors Right lower extremity has 4 minus strength in hip flexor, knee extensor trace, ankle dorsiflexor      Assessment & Plan:   1. Right spastic hemiplegia, good response with Dysport injection Will repeat, not before 10/07/2016 Right Biceps 400 units FCR, 200 units. FDS 200 units. Pronator teres 100 units. FPL, 100 units  We'll continue home health over the next month, return to clinic one month and transition to outpatient rehabilitation. If he continues to make good progress  2. Uncontrolled diabetes. He is taking his medications. According to discharge orders. He may be changing his diet as compared to when he was in the hospital or skilled nursing facility Please call family practice, [facility]

## 2016-08-18 NOTE — Patient Instructions (Signed)
Repeat Botulinum toxin injection after May 8th  Transition to outpatient rehab in April

## 2016-08-19 ENCOUNTER — Telehealth: Payer: Self-pay | Admitting: *Deleted

## 2016-08-19 DIAGNOSIS — N182 Chronic kidney disease, stage 2 (mild): Secondary | ICD-10-CM | POA: Diagnosis not present

## 2016-08-19 DIAGNOSIS — I69351 Hemiplegia and hemiparesis following cerebral infarction affecting right dominant side: Secondary | ICD-10-CM | POA: Diagnosis not present

## 2016-08-19 DIAGNOSIS — I69320 Aphasia following cerebral infarction: Secondary | ICD-10-CM | POA: Diagnosis not present

## 2016-08-19 DIAGNOSIS — E1142 Type 2 diabetes mellitus with diabetic polyneuropathy: Secondary | ICD-10-CM | POA: Diagnosis not present

## 2016-08-19 DIAGNOSIS — I129 Hypertensive chronic kidney disease with stage 1 through stage 4 chronic kidney disease, or unspecified chronic kidney disease: Secondary | ICD-10-CM | POA: Diagnosis not present

## 2016-08-19 DIAGNOSIS — E1151 Type 2 diabetes mellitus with diabetic peripheral angiopathy without gangrene: Secondary | ICD-10-CM | POA: Diagnosis not present

## 2016-08-19 NOTE — Telephone Encounter (Signed)
Physical therapist left message asking for verbal orders, 2week8. Returned phone call and gave verbal orders per office protocol

## 2016-08-20 ENCOUNTER — Telehealth: Payer: Self-pay | Admitting: Internal Medicine

## 2016-08-20 NOTE — Telephone Encounter (Signed)
[facility]; needs verbal orders for OT 2 times a week for six weeks.

## 2016-08-21 DIAGNOSIS — I69351 Hemiplegia and hemiparesis following cerebral infarction affecting right dominant side: Secondary | ICD-10-CM | POA: Diagnosis not present

## 2016-08-21 DIAGNOSIS — E1142 Type 2 diabetes mellitus with diabetic polyneuropathy: Secondary | ICD-10-CM | POA: Diagnosis not present

## 2016-08-21 DIAGNOSIS — I69320 Aphasia following cerebral infarction: Secondary | ICD-10-CM | POA: Diagnosis not present

## 2016-08-21 DIAGNOSIS — N182 Chronic kidney disease, stage 2 (mild): Secondary | ICD-10-CM | POA: Diagnosis not present

## 2016-08-21 DIAGNOSIS — I129 Hypertensive chronic kidney disease with stage 1 through stage 4 chronic kidney disease, or unspecified chronic kidney disease: Secondary | ICD-10-CM | POA: Diagnosis not present

## 2016-08-21 DIAGNOSIS — E1151 Type 2 diabetes mellitus with diabetic peripheral angiopathy without gangrene: Secondary | ICD-10-CM | POA: Diagnosis not present

## 2016-08-21 NOTE — Telephone Encounter (Signed)
Please give verbal order for OT

## 2016-08-21 NOTE — Telephone Encounter (Signed)
Returned call to [place], Psychologist, forensic verbal orders.

## 2016-08-21 NOTE — Telephone Encounter (Signed)
LMOVM for pt to call us back. Dymon Summerhill, CMA  

## 2016-08-22 DIAGNOSIS — N182 Chronic kidney disease, stage 2 (mild): Secondary | ICD-10-CM | POA: Diagnosis not present

## 2016-08-22 DIAGNOSIS — E1142 Type 2 diabetes mellitus with diabetic polyneuropathy: Secondary | ICD-10-CM | POA: Diagnosis not present

## 2016-08-22 DIAGNOSIS — E1151 Type 2 diabetes mellitus with diabetic peripheral angiopathy without gangrene: Secondary | ICD-10-CM | POA: Diagnosis not present

## 2016-08-22 DIAGNOSIS — I69320 Aphasia following cerebral infarction: Secondary | ICD-10-CM | POA: Diagnosis not present

## 2016-08-22 DIAGNOSIS — I129 Hypertensive chronic kidney disease with stage 1 through stage 4 chronic kidney disease, or unspecified chronic kidney disease: Secondary | ICD-10-CM | POA: Diagnosis not present

## 2016-08-22 DIAGNOSIS — I69351 Hemiplegia and hemiparesis following cerebral infarction affecting right dominant side: Secondary | ICD-10-CM | POA: Diagnosis not present

## 2016-08-25 DIAGNOSIS — I69351 Hemiplegia and hemiparesis following cerebral infarction affecting right dominant side: Secondary | ICD-10-CM | POA: Diagnosis not present

## 2016-08-25 DIAGNOSIS — E1142 Type 2 diabetes mellitus with diabetic polyneuropathy: Secondary | ICD-10-CM | POA: Diagnosis not present

## 2016-08-25 DIAGNOSIS — I69320 Aphasia following cerebral infarction: Secondary | ICD-10-CM | POA: Diagnosis not present

## 2016-08-25 DIAGNOSIS — I129 Hypertensive chronic kidney disease with stage 1 through stage 4 chronic kidney disease, or unspecified chronic kidney disease: Secondary | ICD-10-CM | POA: Diagnosis not present

## 2016-08-25 DIAGNOSIS — N182 Chronic kidney disease, stage 2 (mild): Secondary | ICD-10-CM | POA: Diagnosis not present

## 2016-08-25 DIAGNOSIS — E1151 Type 2 diabetes mellitus with diabetic peripheral angiopathy without gangrene: Secondary | ICD-10-CM | POA: Diagnosis not present

## 2016-08-26 ENCOUNTER — Other Ambulatory Visit: Payer: Self-pay | Admitting: Family Medicine

## 2016-08-26 DIAGNOSIS — E1151 Type 2 diabetes mellitus with diabetic peripheral angiopathy without gangrene: Secondary | ICD-10-CM | POA: Diagnosis not present

## 2016-08-26 DIAGNOSIS — E1142 Type 2 diabetes mellitus with diabetic polyneuropathy: Secondary | ICD-10-CM | POA: Diagnosis not present

## 2016-08-26 DIAGNOSIS — I69320 Aphasia following cerebral infarction: Secondary | ICD-10-CM | POA: Diagnosis not present

## 2016-08-26 DIAGNOSIS — I129 Hypertensive chronic kidney disease with stage 1 through stage 4 chronic kidney disease, or unspecified chronic kidney disease: Secondary | ICD-10-CM | POA: Diagnosis not present

## 2016-08-26 DIAGNOSIS — N182 Chronic kidney disease, stage 2 (mild): Secondary | ICD-10-CM | POA: Diagnosis not present

## 2016-08-26 DIAGNOSIS — I69351 Hemiplegia and hemiparesis following cerebral infarction affecting right dominant side: Secondary | ICD-10-CM | POA: Diagnosis not present

## 2016-08-26 NOTE — Telephone Encounter (Signed)
Patient needs to make an appointment to be seen by me with the next month. I will refill meds for 1 month.

## 2016-08-27 DIAGNOSIS — I129 Hypertensive chronic kidney disease with stage 1 through stage 4 chronic kidney disease, or unspecified chronic kidney disease: Secondary | ICD-10-CM | POA: Diagnosis not present

## 2016-08-27 DIAGNOSIS — E1142 Type 2 diabetes mellitus with diabetic polyneuropathy: Secondary | ICD-10-CM | POA: Diagnosis not present

## 2016-08-27 DIAGNOSIS — N182 Chronic kidney disease, stage 2 (mild): Secondary | ICD-10-CM | POA: Diagnosis not present

## 2016-08-27 DIAGNOSIS — I69320 Aphasia following cerebral infarction: Secondary | ICD-10-CM | POA: Diagnosis not present

## 2016-08-27 DIAGNOSIS — E1151 Type 2 diabetes mellitus with diabetic peripheral angiopathy without gangrene: Secondary | ICD-10-CM | POA: Diagnosis not present

## 2016-08-27 DIAGNOSIS — I69351 Hemiplegia and hemiparesis following cerebral infarction affecting right dominant side: Secondary | ICD-10-CM | POA: Diagnosis not present

## 2016-08-27 NOTE — Telephone Encounter (Signed)
Patient informed, has appointment scheduled with PCP for 4/4.

## 2016-08-28 DIAGNOSIS — N182 Chronic kidney disease, stage 2 (mild): Secondary | ICD-10-CM | POA: Diagnosis not present

## 2016-08-28 DIAGNOSIS — I69320 Aphasia following cerebral infarction: Secondary | ICD-10-CM | POA: Diagnosis not present

## 2016-08-28 DIAGNOSIS — I69351 Hemiplegia and hemiparesis following cerebral infarction affecting right dominant side: Secondary | ICD-10-CM | POA: Diagnosis not present

## 2016-08-28 DIAGNOSIS — I129 Hypertensive chronic kidney disease with stage 1 through stage 4 chronic kidney disease, or unspecified chronic kidney disease: Secondary | ICD-10-CM | POA: Diagnosis not present

## 2016-08-28 DIAGNOSIS — E1151 Type 2 diabetes mellitus with diabetic peripheral angiopathy without gangrene: Secondary | ICD-10-CM | POA: Diagnosis not present

## 2016-08-28 DIAGNOSIS — E1142 Type 2 diabetes mellitus with diabetic polyneuropathy: Secondary | ICD-10-CM | POA: Diagnosis not present

## 2016-08-29 DIAGNOSIS — I69320 Aphasia following cerebral infarction: Secondary | ICD-10-CM | POA: Diagnosis not present

## 2016-08-29 DIAGNOSIS — I69351 Hemiplegia and hemiparesis following cerebral infarction affecting right dominant side: Secondary | ICD-10-CM | POA: Diagnosis not present

## 2016-08-29 DIAGNOSIS — I129 Hypertensive chronic kidney disease with stage 1 through stage 4 chronic kidney disease, or unspecified chronic kidney disease: Secondary | ICD-10-CM | POA: Diagnosis not present

## 2016-08-29 DIAGNOSIS — E1142 Type 2 diabetes mellitus with diabetic polyneuropathy: Secondary | ICD-10-CM | POA: Diagnosis not present

## 2016-08-29 DIAGNOSIS — N182 Chronic kidney disease, stage 2 (mild): Secondary | ICD-10-CM | POA: Diagnosis not present

## 2016-08-29 DIAGNOSIS — E1151 Type 2 diabetes mellitus with diabetic peripheral angiopathy without gangrene: Secondary | ICD-10-CM | POA: Diagnosis not present

## 2016-09-02 ENCOUNTER — Ambulatory Visit (INDEPENDENT_AMBULATORY_CARE_PROVIDER_SITE_OTHER): Payer: Medicare Other | Admitting: Nurse Practitioner

## 2016-09-02 ENCOUNTER — Encounter: Payer: Self-pay | Admitting: Nurse Practitioner

## 2016-09-02 VITALS — BP 133/70 | HR 92 | Ht 67.0 in | Wt 243.0 lb

## 2016-09-02 DIAGNOSIS — E782 Mixed hyperlipidemia: Secondary | ICD-10-CM | POA: Diagnosis not present

## 2016-09-02 DIAGNOSIS — I10 Essential (primary) hypertension: Secondary | ICD-10-CM | POA: Diagnosis not present

## 2016-09-02 DIAGNOSIS — I63522 Cerebral infarction due to unspecified occlusion or stenosis of left anterior cerebral artery: Secondary | ICD-10-CM

## 2016-09-02 DIAGNOSIS — I639 Cerebral infarction, unspecified: Secondary | ICD-10-CM | POA: Diagnosis not present

## 2016-09-02 DIAGNOSIS — I69351 Hemiplegia and hemiparesis following cerebral infarction affecting right dominant side: Secondary | ICD-10-CM | POA: Diagnosis not present

## 2016-09-02 NOTE — Patient Instructions (Signed)
Stressed the importance of management of risk factors to prevent further stroke Continue aspirin and Plavix for secondary stroke prevention for 3 months then continue Plavix only on 09/27/16  Maintain strict control of hypertension with blood pressure goal below 130/90, today's reading133/70 continue antihypertensive medications Control of diabetes with hemoglobin A1c below 6.5 followed by primary care most recent hemoglobin A1c6.9 continue diabetic medications Cholesterol with LDL cholesterol less than 70, followed by primary care,  most recent 60 continue Lipitor Continue PT and OT home exercise program when discharged  eat healthy diet with whole grains,  fresh fruits and vegetables Discussed risk for recurrent stroke/ TIA and answered additional questions Follow up in 3 months

## 2016-09-02 NOTE — Progress Notes (Signed)
[facility]  PATIENT: [name] DOB: 19-Oct-1950   REASON FOR VISIT: Hospital follow-up for stroke  HISTORY from patient   HISTORY OF PRESENT ILLNESS: [name], 66 year old male was admitted to the hospital in January presenting with lethargy for 3 days and multiple falls is a history of hypertension diabetes and previous stroke on the right with right hemiparesis. MRI left ACA infarct. CTA head and neck right greater than left atherosclerotic disease 2-D echo 55-60% EF. LDL 60. Hemoglobin A1c 6.9 he returns to the clinic for follow-up. He lives alone he remains on Avonex and aspirin for 3 months then will go to Plavix alone. Blood pressure in the office today 133/70. He is on Lipitor for hyperlipidemia without complaints of myalgias. He is also insulin-dependent diabetic and claims his blood sugars are better controlled. He continues to get occupational therapy and speech therapy in the home. His previous stroke was in 2009. He returns for reevaluation   REVIEW OF SYSTEMS: Full 14 system review of systems performed and notable only for those listed, all others are neg:  Constitutional: neg  Cardiovascular: neg Ear/Nose/Throat: neg  Skin: neg Eyes: neg Respiratory: neg Gastroitestinal: neg  Hematology/Lymphatic: neg  Endocrine: neg Musculoskeletal:neg Allergy/Immunology: neg Neurological: neg Psychiatric: neg Sleep : neg   ALLERGIES: No Known Allergies  HOME MEDICATIONS: Outpatient Medications Prior to Visit  Medication Sig Dispense Refill  . amLODipine (NORVASC) 10 MG tablet Take 1 tablet (10 mg total) by mouth daily. 30 tablet 0  . aspirin 325 MG tablet Take 1 tablet (325 mg total) by mouth daily. 30 tablet 0  . atorvastatin (LIPITOR) 40 MG tablet TAKE 1 TABLET (40 MG TOTAL) BY MOUTH DAILY. 30 tablet 0  . Blood Gluc Meter Disp-Strips (SIDEKICK BLOOD GLUCOSE SYSTEM) DEVI Use for daily testing 100 each 3  . carvedilol (COREG) 25 MG tablet TAKE 1  TABLET BY MOUTH TWICE A DAY WITH A MEAL 30 tablet 0  . chlorthalidone (HYGROTON) 25 MG tablet Take 1 tablet (25 mg total) by mouth daily. 30 tablet 0  . clopidogrel (PLAVIX) 75 MG tablet TAKE 1 TABLET (75 MG TOTAL) BY MOUTH DAILY. 30 tablet 0  . insulin detemir (LEVEMIR) 100 UNIT/ML injection Inject 0.27 mLs (27 Units total) into the skin at bedtime. 10 mL 0  . Insulin Pen Needle (B-D ULTRAFINE III SHORT PEN) 31G X 8 MM MISC USE WITH LEVEMIR 100 each 0  . Insulin Syringe-Needle U-100 28G X 1/2" 1 ML MISC 1 each by Does not apply route 3 (three) times daily. 100 each 11  . lisinopril (PRINIVIL,ZESTRIL) 40 MG tablet TAKE 1 TABLET (40 MG TOTAL) BY MOUTH DAILY. 30 tablet 0  . metFORMIN (GLUCOPHAGE) 1000 MG tablet Take 1 tablet (1,000 mg total) by mouth daily. 30 tablet 0  . sertraline (ZOLOFT) 100 MG tablet Take 1 tablet (100 mg total) by mouth daily. 30 tablet 0   No facility-administered medications prior to visit.     PAST MEDICAL HISTORY: Past Medical History:  Diagnosis Date  . 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 CVA (cerebral infarction) 01/03/2008   MRI: Acute 1 x 1.5 cm infarction affecting the left side of the pons.  . Diabetes mellitus   . DVT (deep venous thrombosis) ([place])   . Hypertension   . PAD (peripheral artery disease) ([place])   . Stroke [facility])    2008    PAST SURGICAL HISTORY: Past Surgical History:  Procedure Laterality Date  . COLONOSCOPY    .  None      FAMILY HISTORY: Family History  Problem Relation Age of Onset  . Diabetes Mother   . Heart attack Mother   . Hypertension Mother   . Diabetes Sister   . Hypertension Sister   . Hypertension Brother   . Hypertension Daughter   . Hypertension Son   . Colon cancer Neg Hx   . Esophageal cancer Neg Hx   . Stomach cancer Neg Hx   . Rectal cancer Neg Hx     SOCIAL HISTORY: Social History   Social History  . Marital status: Divorced    Spouse name: N/A  . Number of children: 3  . Years of  education: 14   Occupational History  . 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 Smoking status: Never Smoker  . Smokeless tobacco: Never Used  . Alcohol use No  . Drug use: No  . Sexual activity: No   Other Topics Concern  . Not on file   Social History Narrative   Divorced, lives alone   2 sons, 1 daughter   Retired/disabled   No caffeine   12/24/2015        PHYSICAL EXAM  Vitals:   09/02/16 1441  BP: 133/70  Pulse: 92  Weight: 243 lb (110.2 kg)  Height: 5\' 7"  (1.702 m)   Body mass index is 38.06 kg/m.  Generalized: Well developed, Obese male in no acute distress  Head: normocephalic and atraumatic,. Oropharynx benign  Neck: Supple, no carotid bruits  Cardiac: Regular rate rhythm, no murmur  Musculoskeletal: No deformity   Neurological examination   Mentation: Alert oriented to time, place, history taking. Attention span and concentration appropriate. Recent and remote memory intact.  Follows all commands speech  with dysarthria .   Cranial nerve II-XII: Pupils were equal round reactive to light extraocular movements were full,  mild left ptosis visual field were full on confrontational test.  mild left facial droop  hearing was intact to finger rubbing bilaterally. Uvula tongue midline. head turning and shoulder shrug were normal and symmetric.Tongue protrusion into cheek strength was normal. Motor: normal bulk and tone, full strength in the BUE, BLE,on the left, 0 out of 5 right upper extremity and right lower extremity. Has brace to right lower extremity  fine finger movements normal, no pronator drift. No focal weakness Sensory: normal and symmetric to light touch, pinprick, and  Vibration,in the upper and lower extremities  Coordination: finger-nose-finger, heel-to-shin bilaterally,normal on the left  Reflexes:1+ in the upper and lower extremities, plantar responses were flexor bilaterally. Gait and Station: Rising up from  seated position with push off, ambulated short distance with a quad cane   DIAGNOSTIC DATA (LABS, IMAGING, TESTING) - I reviewed patient records, labs, notes, testing and imaging myself where available.  Lab Results  Component Value Date   WBC 11.6 (H) 07/16/2016   HGB 12.5 (L) 07/16/2016   HCT 39.6 07/16/2016   MCV 92.5 07/16/2016   PLT 205 07/16/2016      Component Value Date/Time   NA 139 07/16/2016 0614   K 4.6 07/16/2016 0614   CL 103 07/16/2016 0614   CO2 30 07/16/2016 0614   GLUCOSE 103 (H) 07/16/2016 0614   BUN 15 07/16/2016 0614   CREATININE 1.07 07/16/2016 0614   CREATININE 1.02 11/05/2015 1203   CALCIUM 9.0 07/16/2016 0614   PROT 5.7 (L) 07/07/2016 0640   ALBUMIN 2.4 (L) 07/07/2016 0640   AST 36 07/07/2016 0640  ALT 46 07/07/2016 0640   ALKPHOS 50 07/07/2016 0640   BILITOT 0.9 07/07/2016 0640   GFRNONAA >60 07/16/2016 0614   GFRNONAA 77 11/05/2015 1203   GFRAA >60 07/16/2016 0614   GFRAA 89 11/05/2015 1203   Lab Results  Component Value Date   CHOL 91 07/04/2016   HDL 22 (L) 07/04/2016   LDLCALC 60 07/04/2016   LDLDIRECT 78 09/10/2011   TRIG 43 07/04/2016   CHOLHDL 4.1 07/04/2016   Lab Results  Component Value Date   HGBA1C 6.9 (H) 07/03/2016    ASSESSMENT AND PLAN  66 y.o. year old male  here for hospital follow-up for stroke with risk factors of hypertension diabetes and previous stroke peripheral vascular disease chronic kidney disease and hyperlipidemia The patient is a current patient of Dr. Erlinda Hong  who is out of the office today . This note is sent to the work in doctor.     PLAN: Stressed the importance of management of risk factors to prevent further stroke Continue aspirin and Plavix for secondary stroke prevention for 3 months then continue Plavix only on 09/27/16  Maintain strict control of hypertension with blood pressure goal below 130/90, today's reading133/70 continue antihypertensive medications Control of diabetes with hemoglobin A1c  below 6.5 followed by primary care most recent hemoglobin A1c6.9 continue diabetic medications Cholesterol with LDL cholesterol less than 70, followed by primary care,  most recent 60 continue Lipitor Continue PT and OT home exercise program when discharged  eat healthy diet with whole grains,  fresh fruits and vegetables Discussed risk for recurrent stroke/ TIA and answered additional questions Follow up in 3 months This was a prolonged visit requiring 25 minutes and medical decision making of high complexity with extensive review of history, hospital chart, counseling and answering questions Kyle Bible, [facility], [facility], APRN  [facility] [address] [place], [place] [address] [phone]

## 2016-09-03 ENCOUNTER — Encounter: Payer: Self-pay | Admitting: Internal Medicine

## 2016-09-03 ENCOUNTER — Ambulatory Visit (INDEPENDENT_AMBULATORY_CARE_PROVIDER_SITE_OTHER): Payer: Medicare Other | Admitting: Internal Medicine

## 2016-09-03 VITALS — BP 101/80 | HR 92 | Temp 98.5°F | Ht 67.0 in | Wt 240.2 lb

## 2016-09-03 DIAGNOSIS — I639 Cerebral infarction, unspecified: Secondary | ICD-10-CM | POA: Diagnosis not present

## 2016-09-03 DIAGNOSIS — I10 Essential (primary) hypertension: Secondary | ICD-10-CM

## 2016-09-03 DIAGNOSIS — E1159 Type 2 diabetes mellitus with other circulatory complications: Secondary | ICD-10-CM

## 2016-09-03 DIAGNOSIS — Z794 Long term (current) use of insulin: Secondary | ICD-10-CM

## 2016-09-03 DIAGNOSIS — I63522 Cerebral infarction due to unspecified occlusion or stenosis of left anterior cerebral artery: Secondary | ICD-10-CM

## 2016-09-03 LAB — POCT GLYCOSYLATED HEMOGLOBIN (HGB A1C): Hemoglobin A1C: 7.3

## 2016-09-03 MED ORDER — CARVEDILOL 25 MG PO TABS: ORAL_TABLET | ORAL | 3 refills | Status: DC

## 2016-09-03 MED ORDER — LISINOPRIL 40 MG PO TABS: 40.0000 mg | ORAL_TABLET | Freq: Every day | ORAL | 3 refills | Status: DC

## 2016-09-03 MED ORDER — ATORVASTATIN CALCIUM 40 MG PO TABS: 40.0000 mg | ORAL_TABLET | Freq: Every day | ORAL | 3 refills | Status: DC

## 2016-09-03 MED ORDER — AMLODIPINE BESYLATE 10 MG PO TABS: 10.0000 mg | ORAL_TABLET | Freq: Every day | ORAL | 3 refills | Status: DC

## 2016-09-03 MED ORDER — INSULIN ASPART 100 UNIT/ML FLEXPEN: 15.0000 [IU] | PEN_INJECTOR | Freq: Three times a day (TID) | SUBCUTANEOUS | 11 refills | Status: DC

## 2016-09-03 MED ORDER — METFORMIN HCL 1000 MG PO TABS: 1000.0000 mg | ORAL_TABLET | Freq: Two times a day (BID) | ORAL | 0 refills | Status: DC

## 2016-09-03 MED ORDER — CHLORTHALIDONE 25 MG PO TABS: 25.0000 mg | ORAL_TABLET | Freq: Every day | ORAL | 3 refills | Status: DC

## 2016-09-03 MED ORDER — SERTRALINE HCL 100 MG PO TABS: 100.0000 mg | ORAL_TABLET | Freq: Every day | ORAL | 1 refills | Status: DC

## 2016-09-03 NOTE — Patient Instructions (Addendum)
Please follow up in 1 month.  I want to increase your metformin to 1000 mg once in the morning and once at night. You might not need as much insulin, if I start the metformin, if you find your blood sugars are dropping below 80, please follow up sooner than 1 month, also make sure to give me a call.  You will stop taking aspirin 325 mg on 09/27/2016, you can go back to taking a baby aspirin

## 2016-09-03 NOTE — Progress Notes (Signed)
I have read the note, and I agree with the clinical assessment and plan.  Richard A. Sater, MD, PhD Certified in Neurology, Clinical Neurophysiology, Sleep Medicine, Pain Medicine and Neuroimaging  [facility] [address], [address] [place], [place] [address] [phone]  

## 2016-09-03 NOTE — Progress Notes (Signed)
[facility] [name], MD Phone: [phone]  Reason For Visit:   # CHRONIC DM, Type 2: Reports metformin 1000 mg daily ; Levemir 30 BID, Novolog 15 units  CBGs: Fasting Glucose 120-140, after meals - CBGs 278- 300, then take Novolog 15 units with meal TID  Patient had low blood sugars in the hospital, but none since then. Patient was at the nursing home with a reduced dose of insulin - Levemir 27 units BID without any meal time coverage. He restarted his old medication dose once home as he states his blood sugars were not well controlled - in the 300- 400s   Reports  good compliance. Tolerating well w/o side-effects Currently on ACEi / ARB Lifestyle: significant difficulty getting around following stroke  Any hypoglycemia episodes: Denies palpations, diaphoresis, fatigue, weakness, jittery Denies polyuria, visual changes, numbness or tingling.  CVA  - Recently seen by neurology  - Continue plavix and asipirin for now  - Plan to stop the Aspirin on 09/27/2016 - Feels secure  - Patient is being seen every couple of days by [facility] - coming consistently - physical/occupational  - Has family here - daughter and son -> oldest son live in [place]  - Patient has applied for meals on wheels, able to cook simple meals - daughter helps with getting grocery's from the store  - general his mood is good, he feels that he is coping well   CHRONIC HTN: Reports Current Meds - has been taking pills daily, has some in the pill box  Reports good compliance, took meds today. Tolerating well, w/o complaints. Lifestyle - as above  Denies CP, dyspnea, HA, edema, dizziness / lightheadedness  Past Medical History Reviewed problem list.  Medications- reviewed and updated No additions to family history Social history- patient is a non-smoker  Objective: BP 101/80 (BP Location: Left Wrist, Patient Position: Sitting, Cuff Size: Normal)   Pulse 92   Temp 98.5 F (36.9  C) (Oral)   Ht 5\' 7"  (1.702 m)   Wt 240 lb 3.2 oz (109 kg)   SpO2 98%   BMI 37.62 kg/m  Gen: NAD, alert, cooperative with exam HEENT: Normal    Neck: No masses palpated. No lymphadenopathy    Eyes: PERRLA, EOMI Cardio: regular rate and rhythm, S1S2 heard, no murmurs appreciated Pulm: clear to auscultation bilaterally, no wheezes, rhonchi or rales Extremities: warm, well perfused, No edema, cyanosis or clubbing;  MSK: Normal gait and station Skin: dry, intact, no rashes or lesions Neuro:5/5 out of 5/5 strengths on left side, 2/5 strength on right side, otherwise normal neurologic status, can walk with a cane, however indicates slowly   Assessment/Plan: See problem based a/p  Cerebrovascular accident (CVA) ([place]) Recent CVA with right hemiparesis  - Plan per neurology note to stop double coverage with aspirin and plavix on 09/27/2016 - patient aware  - Patient is definitely a fall risk - however indicates having resources 3 times a week including physical and occupational therapy  - Family daughter and son help with groceries and food  - Mood - not depressed, no SI - will get an objective PHQ-9 at next visit  - continue to follow with neurology  - Consider resource needs being meet at each visit.   Essential hypertension, benign Well controlled, due to hx of stroke aggressive control less than 130/80  - continue norvasc, lisinopril and coreg  - Follow up in 1 month    Diabetes mellitus, type II ([place]) A1C  7.1, goal due to multiple co-morbidities - around 7.5 Patient restarted home regiment - previously on decreased dose while in the hospital and nursing home - for hypoglycemia in the hospital  - Will increase metformin to 1000 mg BID - patient with a long hx of insulin use therefore unlikely to have a large affect  - Will continue insulin - Levemir 30 BID and 15 units of Novolog - indicated that patient could taking this medication before eating - rather than after eating to  maintain better control of diabetes  - Follow up in 1 month - earlier if any issues with hypoglycemia

## 2016-09-04 DIAGNOSIS — E1151 Type 2 diabetes mellitus with diabetic peripheral angiopathy without gangrene: Secondary | ICD-10-CM | POA: Diagnosis not present

## 2016-09-04 DIAGNOSIS — I129 Hypertensive chronic kidney disease with stage 1 through stage 4 chronic kidney disease, or unspecified chronic kidney disease: Secondary | ICD-10-CM | POA: Diagnosis not present

## 2016-09-04 DIAGNOSIS — N182 Chronic kidney disease, stage 2 (mild): Secondary | ICD-10-CM | POA: Diagnosis not present

## 2016-09-04 DIAGNOSIS — E1142 Type 2 diabetes mellitus with diabetic polyneuropathy: Secondary | ICD-10-CM | POA: Diagnosis not present

## 2016-09-04 DIAGNOSIS — I69351 Hemiplegia and hemiparesis following cerebral infarction affecting right dominant side: Secondary | ICD-10-CM | POA: Diagnosis not present

## 2016-09-04 DIAGNOSIS — I69320 Aphasia following cerebral infarction: Secondary | ICD-10-CM | POA: Diagnosis not present

## 2016-09-05 DIAGNOSIS — E1151 Type 2 diabetes mellitus with diabetic peripheral angiopathy without gangrene: Secondary | ICD-10-CM | POA: Diagnosis not present

## 2016-09-05 DIAGNOSIS — I129 Hypertensive chronic kidney disease with stage 1 through stage 4 chronic kidney disease, or unspecified chronic kidney disease: Secondary | ICD-10-CM | POA: Diagnosis not present

## 2016-09-05 DIAGNOSIS — E1142 Type 2 diabetes mellitus with diabetic polyneuropathy: Secondary | ICD-10-CM | POA: Diagnosis not present

## 2016-09-05 DIAGNOSIS — I69351 Hemiplegia and hemiparesis following cerebral infarction affecting right dominant side: Secondary | ICD-10-CM | POA: Diagnosis not present

## 2016-09-05 DIAGNOSIS — N182 Chronic kidney disease, stage 2 (mild): Secondary | ICD-10-CM | POA: Diagnosis not present

## 2016-09-05 DIAGNOSIS — I69320 Aphasia following cerebral infarction: Secondary | ICD-10-CM | POA: Diagnosis not present

## 2016-09-08 DIAGNOSIS — I69320 Aphasia following cerebral infarction: Secondary | ICD-10-CM | POA: Diagnosis not present

## 2016-09-08 DIAGNOSIS — E1151 Type 2 diabetes mellitus with diabetic peripheral angiopathy without gangrene: Secondary | ICD-10-CM | POA: Diagnosis not present

## 2016-09-08 DIAGNOSIS — N182 Chronic kidney disease, stage 2 (mild): Secondary | ICD-10-CM | POA: Diagnosis not present

## 2016-09-08 DIAGNOSIS — I129 Hypertensive chronic kidney disease with stage 1 through stage 4 chronic kidney disease, or unspecified chronic kidney disease: Secondary | ICD-10-CM | POA: Diagnosis not present

## 2016-09-08 DIAGNOSIS — I69351 Hemiplegia and hemiparesis following cerebral infarction affecting right dominant side: Secondary | ICD-10-CM | POA: Diagnosis not present

## 2016-09-08 DIAGNOSIS — E1142 Type 2 diabetes mellitus with diabetic polyneuropathy: Secondary | ICD-10-CM | POA: Diagnosis not present

## 2016-09-09 DIAGNOSIS — I69351 Hemiplegia and hemiparesis following cerebral infarction affecting right dominant side: Secondary | ICD-10-CM | POA: Diagnosis not present

## 2016-09-09 DIAGNOSIS — I69320 Aphasia following cerebral infarction: Secondary | ICD-10-CM | POA: Diagnosis not present

## 2016-09-09 DIAGNOSIS — N182 Chronic kidney disease, stage 2 (mild): Secondary | ICD-10-CM | POA: Diagnosis not present

## 2016-09-09 DIAGNOSIS — E1151 Type 2 diabetes mellitus with diabetic peripheral angiopathy without gangrene: Secondary | ICD-10-CM | POA: Diagnosis not present

## 2016-09-09 DIAGNOSIS — E1142 Type 2 diabetes mellitus with diabetic polyneuropathy: Secondary | ICD-10-CM | POA: Diagnosis not present

## 2016-09-09 DIAGNOSIS — I129 Hypertensive chronic kidney disease with stage 1 through stage 4 chronic kidney disease, or unspecified chronic kidney disease: Secondary | ICD-10-CM | POA: Diagnosis not present

## 2016-09-10 DIAGNOSIS — E1142 Type 2 diabetes mellitus with diabetic polyneuropathy: Secondary | ICD-10-CM | POA: Diagnosis not present

## 2016-09-10 DIAGNOSIS — N182 Chronic kidney disease, stage 2 (mild): Secondary | ICD-10-CM | POA: Diagnosis not present

## 2016-09-10 DIAGNOSIS — I129 Hypertensive chronic kidney disease with stage 1 through stage 4 chronic kidney disease, or unspecified chronic kidney disease: Secondary | ICD-10-CM | POA: Diagnosis not present

## 2016-09-10 DIAGNOSIS — I69351 Hemiplegia and hemiparesis following cerebral infarction affecting right dominant side: Secondary | ICD-10-CM | POA: Diagnosis not present

## 2016-09-10 DIAGNOSIS — I69320 Aphasia following cerebral infarction: Secondary | ICD-10-CM | POA: Diagnosis not present

## 2016-09-10 DIAGNOSIS — E1151 Type 2 diabetes mellitus with diabetic peripheral angiopathy without gangrene: Secondary | ICD-10-CM | POA: Diagnosis not present

## 2016-09-10 NOTE — Assessment & Plan Note (Signed)
A1C 7.1, goal due to multiple co-morbidities - around 7.5 Patient restarted home regiment - previously on decreased dose while in the hospital and nursing home - for hypoglycemia in the hospital  - Will increase metformin to 1000 mg BID - patient with a long hx of insulin use therefore unlikely to have a large affect  - Will continue insulin - Levemir 30 BID and 15 units of Novolog - indicated that patient could taking this medication before eating - rather than after eating to maintain better control of diabetes  - Follow up in 1 month - earlier if any issues with hypoglycemia

## 2016-09-10 NOTE — Assessment & Plan Note (Signed)
Recent CVA with right hemiparesis  - Plan per neurology note to stop double coverage with aspirin and plavix on 09/27/2016 - patient aware  - Patient is definitely a fall risk - however indicates having resources 3 times a week including physical and occupational therapy  - Family daughter and son help with groceries and food  - Mood - not depressed, no SI - will get an objective PHQ-9 at next visit  - continue to follow with neurology  - Consider resource needs being meet at each visit.

## 2016-09-10 NOTE — Assessment & Plan Note (Signed)
Well controlled, due to hx of stroke aggressive control less than 130/80  - continue norvasc, lisinopril and coreg  - Follow up in 1 month

## 2016-09-15 ENCOUNTER — Ambulatory Visit: Payer: Medicare Other | Admitting: Physical Medicine & Rehabilitation

## 2016-09-16 ENCOUNTER — Encounter: Payer: Self-pay | Admitting: Physical Medicine & Rehabilitation

## 2016-09-16 ENCOUNTER — Ambulatory Visit (HOSPITAL_BASED_OUTPATIENT_CLINIC_OR_DEPARTMENT_OTHER): Payer: Medicare Other | Admitting: Physical Medicine & Rehabilitation

## 2016-09-16 ENCOUNTER — Encounter: Payer: Medicare Other | Attending: Physical Medicine & Rehabilitation

## 2016-09-16 DIAGNOSIS — I129 Hypertensive chronic kidney disease with stage 1 through stage 4 chronic kidney disease, or unspecified chronic kidney disease: Secondary | ICD-10-CM | POA: Insufficient documentation

## 2016-09-16 DIAGNOSIS — Z8249 Family history of ischemic heart disease and other diseases of the circulatory system: Secondary | ICD-10-CM | POA: Insufficient documentation

## 2016-09-16 DIAGNOSIS — G8111 Spastic hemiplegia affecting right dominant side: Secondary | ICD-10-CM | POA: Diagnosis not present

## 2016-09-16 DIAGNOSIS — G8110 Spastic hemiplegia affecting unspecified side: Secondary | ICD-10-CM | POA: Diagnosis not present

## 2016-09-16 DIAGNOSIS — I639 Cerebral infarction, unspecified: Secondary | ICD-10-CM | POA: Diagnosis not present

## 2016-09-16 DIAGNOSIS — N189 Chronic kidney disease, unspecified: Secondary | ICD-10-CM | POA: Diagnosis not present

## 2016-09-16 DIAGNOSIS — Z86718 Personal history of other venous thrombosis and embolism: Secondary | ICD-10-CM | POA: Diagnosis not present

## 2016-09-16 DIAGNOSIS — Z794 Long term (current) use of insulin: Secondary | ICD-10-CM | POA: Diagnosis not present

## 2016-09-16 DIAGNOSIS — I69322 Dysarthria following cerebral infarction: Secondary | ICD-10-CM | POA: Diagnosis not present

## 2016-09-16 DIAGNOSIS — N172 Acute kidney failure with medullary necrosis: Secondary | ICD-10-CM | POA: Diagnosis not present

## 2016-09-16 DIAGNOSIS — E1122 Type 2 diabetes mellitus with diabetic chronic kidney disease: Secondary | ICD-10-CM | POA: Insufficient documentation

## 2016-09-16 DIAGNOSIS — T8611 Kidney transplant rejection: Secondary | ICD-10-CM | POA: Insufficient documentation

## 2016-09-16 DIAGNOSIS — I739 Peripheral vascular disease, unspecified: Secondary | ICD-10-CM | POA: Diagnosis not present

## 2016-09-16 DIAGNOSIS — Z833 Family history of diabetes mellitus: Secondary | ICD-10-CM | POA: Diagnosis not present

## 2016-09-16 NOTE — Patient Instructions (Signed)
Will repeat the Botox next month

## 2016-09-16 NOTE — Progress Notes (Signed)
Subjective:    Patient ID: [name], male    DOB: Oct 06, 1950, 66 y.o.   MRN: [id number]  HPI Received Dysport injection 07/10/2016 with good results to the following muscles Right Biceps 400 units FCR, 200 units. FDS 200 units. Pronator teres 100 units. FPL, 100 units  Seen in March 2018 , had problems Elevated blood sugars, now improved after metformin dose was increased. Patient met with his primary physician. Patient showed me his CBGs and these were in the 100-120 range  Still receiving [place] services, PT and OT will be coming out another 4 wks  Pt mod I dressing and bathing, cooks Mod I No falls at home  Neuro f/I 09/02/16, Pt is aware that Plavix  45m and ASA 3221mcont until 4/28 Pain Inventory Average Pain 0 Pain Right Now 0 My pain is no pain  In the last 24 hours, has pain interfered with the following? General activity 0 Relation with others 0 Enjoyment of life 0 What TIME of day is your pain at its worst? no pain Sleep (in general) Good  Pain is worse with: no pain Pain improves with: no pain Relief from Meds: no pain  Mobility walk with assistance use a cane  Function disabled: date disabled .  Neuro/Psych bladder control problems weakness numbness trouble walking  Prior Studies Any changes since last visit?  no  Physicians involved in your care Any changes since last visit?  no   Family History  Problem Relation Age of Onset  . Diabetes Mother   . Heart attack Mother   . Hypertension Mother   . Diabetes Sister   . Hypertension Sister   . Hypertension Brother   . Hypertension Daughter   . Hypertension Son   . Colon cancer Neg Hx   . Esophageal cancer Neg Hx   . Stomach cancer Neg Hx   . Rectal cancer Neg Hx    Social History   Social History  . Marital status: Divorced    Spouse name: N/A  . Number of children: 3  . Years of education: 14   Occupational History  . Disabled-security guard [employer]  . Smoking status: Never Smoker  . Smokeless tobacco: Never Used  . Alcohol use No  . Drug use: No  . Sexual activity: No   Other Topics Concern  . None   Social History Narrative   Divorced, lives alone   2 sons, 1 daughter   Retired/disabled   No caffeine   12/24/2015      Past Surgical History:  Procedure Laterality Date  . COLONOSCOPY    . None     Past Medical History:  Diagnosis Date  . 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 CVA (cerebral infarction) 01/03/2008   MRI: Acute 1 x 1.5 cm infarction affecting the left side of the pons.  . Diabetes mellitus   . D[place] thrombosis) ([facility]  . Hypertension   . P[place] (peripheral artery disease) ([facility]  . [facility]   [facility]   There were no vitals taken for this visit.  Opioid Risk Score:   Fall Risk Score:  `1  Depression screen PHQ 2/9  [facility] 11/30/2015 11/05/2015 10/23/2015 09/25/2015 05/02/2014 05/09/2013  Decreased Interest 0 0 0 0 0 0  Down, Depressed, Hopeless 0 0 0 0 - 0  PHQ - 2 Score 0 0 0 0 0 0  Review of Systems  HENT: Negative.   Eyes: Negative.   Respiratory: Negative.   Cardiovascular: Negative.   Gastrointestinal: Negative.   Endocrine: Negative.   Genitourinary: Negative.   Musculoskeletal: Positive for gait problem.  Skin: Negative.   Allergic/Immunologic: Negative.   Neurological: Positive for weakness and numbness.  Hematological: Negative.   Psychiatric/Behavioral: Negative.   All other systems reviewed and are negative.      Objective:   Physical Exam  Constitutional: He is oriented to person, place, and time. He appears well-developed and well-nourished. No distress.  HENT:  Head: Normocephalic and atraumatic.  Eyes: Conjunctivae are normal. Pupils are equal, round, and reactive to light.  Neurological: He is alert and oriented to person, place, and time.  Speech is dysarthric  Motor strength is 3 minus at the deltoid,  biceps 2 minus at the triceps, 2 minus at the finger flexors. Trace finger extension. Right lower limb is 4 minus at the hip flexor, knee extensor, trace ankle dorsiflexor  Tone Ashworth grade 3 at the finger flexors 2, at the wrist flexors and 2 at the elbow flexors.  Skin: He is not diaphoretic.  Nursing note and vitals reviewed.         Assessment & Plan:  1. 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 Would recommend reinjection in approximately 1 month.  2. Left ACA distribution infarct 07/01/2016. He did lose some additional right lower extremity strength is result of this, but is more or less back to his functional baseline of modified independent with self-care and mobility. He will finish out his home health therapy, will reassess need for outpatient therapy once this is completed

## 2016-09-17 ENCOUNTER — Telehealth: Payer: Self-pay | Admitting: Internal Medicine

## 2016-09-17 DIAGNOSIS — I129 Hypertensive chronic kidney disease with stage 1 through stage 4 chronic kidney disease, or unspecified chronic kidney disease: Secondary | ICD-10-CM | POA: Diagnosis not present

## 2016-09-17 DIAGNOSIS — I69351 Hemiplegia and hemiparesis following cerebral infarction affecting right dominant side: Secondary | ICD-10-CM | POA: Diagnosis not present

## 2016-09-17 DIAGNOSIS — E1151 Type 2 diabetes mellitus with diabetic peripheral angiopathy without gangrene: Secondary | ICD-10-CM | POA: Diagnosis not present

## 2016-09-17 DIAGNOSIS — N182 Chronic kidney disease, stage 2 (mild): Secondary | ICD-10-CM | POA: Diagnosis not present

## 2016-09-17 DIAGNOSIS — E1142 Type 2 diabetes mellitus with diabetic polyneuropathy: Secondary | ICD-10-CM | POA: Diagnosis not present

## 2016-09-17 DIAGNOSIS — I69320 Aphasia following cerebral infarction: Secondary | ICD-10-CM | POA: Diagnosis not present

## 2016-09-17 NOTE — Telephone Encounter (Signed)
Please provide verbal orders  Thanks

## 2016-09-17 NOTE — Telephone Encounter (Signed)
[place] from [facility] called and is requesting to extend OT for 2 times a week for 2 weeks. Please call with verbal orders and if no answer this phone is HIPPA compliant so leave a message. jw

## 2016-09-18 DIAGNOSIS — I69351 Hemiplegia and hemiparesis following cerebral infarction affecting right dominant side: Secondary | ICD-10-CM | POA: Diagnosis not present

## 2016-09-18 DIAGNOSIS — N182 Chronic kidney disease, stage 2 (mild): Secondary | ICD-10-CM | POA: Diagnosis not present

## 2016-09-18 DIAGNOSIS — E1142 Type 2 diabetes mellitus with diabetic polyneuropathy: Secondary | ICD-10-CM | POA: Diagnosis not present

## 2016-09-18 DIAGNOSIS — I69320 Aphasia following cerebral infarction: Secondary | ICD-10-CM | POA: Diagnosis not present

## 2016-09-18 DIAGNOSIS — I129 Hypertensive chronic kidney disease with stage 1 through stage 4 chronic kidney disease, or unspecified chronic kidney disease: Secondary | ICD-10-CM | POA: Diagnosis not present

## 2016-09-18 DIAGNOSIS — E1151 Type 2 diabetes mellitus with diabetic peripheral angiopathy without gangrene: Secondary | ICD-10-CM | POA: Diagnosis not present

## 2016-09-18 NOTE — Telephone Encounter (Signed)
Left message on voicemail authorizing verbal orders.

## 2016-09-19 DIAGNOSIS — E1142 Type 2 diabetes mellitus with diabetic polyneuropathy: Secondary | ICD-10-CM | POA: Diagnosis not present

## 2016-09-19 DIAGNOSIS — I129 Hypertensive chronic kidney disease with stage 1 through stage 4 chronic kidney disease, or unspecified chronic kidney disease: Secondary | ICD-10-CM | POA: Diagnosis not present

## 2016-09-19 DIAGNOSIS — E1151 Type 2 diabetes mellitus with diabetic peripheral angiopathy without gangrene: Secondary | ICD-10-CM | POA: Diagnosis not present

## 2016-09-19 DIAGNOSIS — N182 Chronic kidney disease, stage 2 (mild): Secondary | ICD-10-CM | POA: Diagnosis not present

## 2016-09-19 DIAGNOSIS — I69320 Aphasia following cerebral infarction: Secondary | ICD-10-CM | POA: Diagnosis not present

## 2016-09-19 DIAGNOSIS — I69351 Hemiplegia and hemiparesis following cerebral infarction affecting right dominant side: Secondary | ICD-10-CM | POA: Diagnosis not present

## 2016-09-22 DIAGNOSIS — N182 Chronic kidney disease, stage 2 (mild): Secondary | ICD-10-CM | POA: Diagnosis not present

## 2016-09-22 DIAGNOSIS — I69320 Aphasia following cerebral infarction: Secondary | ICD-10-CM | POA: Diagnosis not present

## 2016-09-22 DIAGNOSIS — I129 Hypertensive chronic kidney disease with stage 1 through stage 4 chronic kidney disease, or unspecified chronic kidney disease: Secondary | ICD-10-CM | POA: Diagnosis not present

## 2016-09-22 DIAGNOSIS — E1142 Type 2 diabetes mellitus with diabetic polyneuropathy: Secondary | ICD-10-CM | POA: Diagnosis not present

## 2016-09-22 DIAGNOSIS — I69351 Hemiplegia and hemiparesis following cerebral infarction affecting right dominant side: Secondary | ICD-10-CM | POA: Diagnosis not present

## 2016-09-22 DIAGNOSIS — E1151 Type 2 diabetes mellitus with diabetic peripheral angiopathy without gangrene: Secondary | ICD-10-CM | POA: Diagnosis not present

## 2016-09-24 DIAGNOSIS — I69320 Aphasia following cerebral infarction: Secondary | ICD-10-CM | POA: Diagnosis not present

## 2016-09-24 DIAGNOSIS — N182 Chronic kidney disease, stage 2 (mild): Secondary | ICD-10-CM | POA: Diagnosis not present

## 2016-09-24 DIAGNOSIS — I69351 Hemiplegia and hemiparesis following cerebral infarction affecting right dominant side: Secondary | ICD-10-CM | POA: Diagnosis not present

## 2016-09-24 DIAGNOSIS — E1142 Type 2 diabetes mellitus with diabetic polyneuropathy: Secondary | ICD-10-CM | POA: Diagnosis not present

## 2016-09-24 DIAGNOSIS — I129 Hypertensive chronic kidney disease with stage 1 through stage 4 chronic kidney disease, or unspecified chronic kidney disease: Secondary | ICD-10-CM | POA: Diagnosis not present

## 2016-09-24 DIAGNOSIS — E1151 Type 2 diabetes mellitus with diabetic peripheral angiopathy without gangrene: Secondary | ICD-10-CM | POA: Diagnosis not present

## 2016-09-26 DIAGNOSIS — I129 Hypertensive chronic kidney disease with stage 1 through stage 4 chronic kidney disease, or unspecified chronic kidney disease: Secondary | ICD-10-CM | POA: Diagnosis not present

## 2016-09-26 DIAGNOSIS — E1151 Type 2 diabetes mellitus with diabetic peripheral angiopathy without gangrene: Secondary | ICD-10-CM | POA: Diagnosis not present

## 2016-09-26 DIAGNOSIS — I69320 Aphasia following cerebral infarction: Secondary | ICD-10-CM | POA: Diagnosis not present

## 2016-09-26 DIAGNOSIS — N182 Chronic kidney disease, stage 2 (mild): Secondary | ICD-10-CM | POA: Diagnosis not present

## 2016-09-26 DIAGNOSIS — E1142 Type 2 diabetes mellitus with diabetic polyneuropathy: Secondary | ICD-10-CM | POA: Diagnosis not present

## 2016-09-26 DIAGNOSIS — I69351 Hemiplegia and hemiparesis following cerebral infarction affecting right dominant side: Secondary | ICD-10-CM | POA: Diagnosis not present

## 2016-09-29 DIAGNOSIS — N182 Chronic kidney disease, stage 2 (mild): Secondary | ICD-10-CM | POA: Diagnosis not present

## 2016-09-29 DIAGNOSIS — E1142 Type 2 diabetes mellitus with diabetic polyneuropathy: Secondary | ICD-10-CM | POA: Diagnosis not present

## 2016-09-29 DIAGNOSIS — I69351 Hemiplegia and hemiparesis following cerebral infarction affecting right dominant side: Secondary | ICD-10-CM | POA: Diagnosis not present

## 2016-09-29 DIAGNOSIS — I69320 Aphasia following cerebral infarction: Secondary | ICD-10-CM | POA: Diagnosis not present

## 2016-09-29 DIAGNOSIS — I129 Hypertensive chronic kidney disease with stage 1 through stage 4 chronic kidney disease, or unspecified chronic kidney disease: Secondary | ICD-10-CM | POA: Diagnosis not present

## 2016-09-29 DIAGNOSIS — E1151 Type 2 diabetes mellitus with diabetic peripheral angiopathy without gangrene: Secondary | ICD-10-CM | POA: Diagnosis not present

## 2016-10-01 DIAGNOSIS — I69320 Aphasia following cerebral infarction: Secondary | ICD-10-CM | POA: Diagnosis not present

## 2016-10-01 DIAGNOSIS — I129 Hypertensive chronic kidney disease with stage 1 through stage 4 chronic kidney disease, or unspecified chronic kidney disease: Secondary | ICD-10-CM | POA: Diagnosis not present

## 2016-10-01 DIAGNOSIS — E1151 Type 2 diabetes mellitus with diabetic peripheral angiopathy without gangrene: Secondary | ICD-10-CM | POA: Diagnosis not present

## 2016-10-01 DIAGNOSIS — E1142 Type 2 diabetes mellitus with diabetic polyneuropathy: Secondary | ICD-10-CM | POA: Diagnosis not present

## 2016-10-01 DIAGNOSIS — I69351 Hemiplegia and hemiparesis following cerebral infarction affecting right dominant side: Secondary | ICD-10-CM | POA: Diagnosis not present

## 2016-10-01 DIAGNOSIS — N182 Chronic kidney disease, stage 2 (mild): Secondary | ICD-10-CM | POA: Diagnosis not present

## 2016-10-02 ENCOUNTER — Other Ambulatory Visit: Payer: Self-pay | Admitting: *Deleted

## 2016-10-02 DIAGNOSIS — I69320 Aphasia following cerebral infarction: Secondary | ICD-10-CM | POA: Diagnosis not present

## 2016-10-02 DIAGNOSIS — E1142 Type 2 diabetes mellitus with diabetic polyneuropathy: Secondary | ICD-10-CM | POA: Diagnosis not present

## 2016-10-02 DIAGNOSIS — E1151 Type 2 diabetes mellitus with diabetic peripheral angiopathy without gangrene: Secondary | ICD-10-CM | POA: Diagnosis not present

## 2016-10-02 DIAGNOSIS — N182 Chronic kidney disease, stage 2 (mild): Secondary | ICD-10-CM | POA: Diagnosis not present

## 2016-10-02 DIAGNOSIS — I69351 Hemiplegia and hemiparesis following cerebral infarction affecting right dominant side: Secondary | ICD-10-CM | POA: Diagnosis not present

## 2016-10-02 DIAGNOSIS — I129 Hypertensive chronic kidney disease with stage 1 through stage 4 chronic kidney disease, or unspecified chronic kidney disease: Secondary | ICD-10-CM | POA: Diagnosis not present

## 2016-10-02 MED ORDER — METFORMIN HCL 1000 MG PO TABS: 1000.0000 mg | ORAL_TABLET | Freq: Two times a day (BID) | ORAL | 0 refills | Status: DC

## 2016-10-03 ENCOUNTER — Other Ambulatory Visit: Payer: Self-pay | Admitting: *Deleted

## 2016-10-03 MED ORDER — INSULIN DETEMIR 100 UNIT/ML ~~LOC~~ SOLN: 27.0000 [IU] | Freq: Every day | SUBCUTANEOUS | 0 refills | Status: DC

## 2016-10-06 DIAGNOSIS — E1142 Type 2 diabetes mellitus with diabetic polyneuropathy: Secondary | ICD-10-CM | POA: Diagnosis not present

## 2016-10-06 DIAGNOSIS — I69320 Aphasia following cerebral infarction: Secondary | ICD-10-CM | POA: Diagnosis not present

## 2016-10-06 DIAGNOSIS — N182 Chronic kidney disease, stage 2 (mild): Secondary | ICD-10-CM | POA: Diagnosis not present

## 2016-10-06 DIAGNOSIS — E1151 Type 2 diabetes mellitus with diabetic peripheral angiopathy without gangrene: Secondary | ICD-10-CM | POA: Diagnosis not present

## 2016-10-06 DIAGNOSIS — I129 Hypertensive chronic kidney disease with stage 1 through stage 4 chronic kidney disease, or unspecified chronic kidney disease: Secondary | ICD-10-CM | POA: Diagnosis not present

## 2016-10-06 DIAGNOSIS — I69351 Hemiplegia and hemiparesis following cerebral infarction affecting right dominant side: Secondary | ICD-10-CM | POA: Diagnosis not present

## 2016-10-08 DIAGNOSIS — I69351 Hemiplegia and hemiparesis following cerebral infarction affecting right dominant side: Secondary | ICD-10-CM | POA: Diagnosis not present

## 2016-10-08 DIAGNOSIS — I69320 Aphasia following cerebral infarction: Secondary | ICD-10-CM | POA: Diagnosis not present

## 2016-10-08 DIAGNOSIS — I129 Hypertensive chronic kidney disease with stage 1 through stage 4 chronic kidney disease, or unspecified chronic kidney disease: Secondary | ICD-10-CM | POA: Diagnosis not present

## 2016-10-08 DIAGNOSIS — E1142 Type 2 diabetes mellitus with diabetic polyneuropathy: Secondary | ICD-10-CM | POA: Diagnosis not present

## 2016-10-08 DIAGNOSIS — E1151 Type 2 diabetes mellitus with diabetic peripheral angiopathy without gangrene: Secondary | ICD-10-CM | POA: Diagnosis not present

## 2016-10-08 DIAGNOSIS — N182 Chronic kidney disease, stage 2 (mild): Secondary | ICD-10-CM | POA: Diagnosis not present

## 2016-10-14 ENCOUNTER — Encounter: Payer: Medicare Other | Attending: Physical Medicine & Rehabilitation

## 2016-10-14 ENCOUNTER — Ambulatory Visit (HOSPITAL_BASED_OUTPATIENT_CLINIC_OR_DEPARTMENT_OTHER): Payer: Medicare Other | Admitting: Physical Medicine & Rehabilitation

## 2016-10-14 ENCOUNTER — Encounter: Payer: Self-pay | Admitting: Physical Medicine & Rehabilitation

## 2016-10-14 VITALS — BP 111/65 | HR 61

## 2016-10-14 DIAGNOSIS — G8110 Spastic hemiplegia affecting unspecified side: Secondary | ICD-10-CM

## 2016-10-14 DIAGNOSIS — Z86718 Personal history of other venous thrombosis and embolism: Secondary | ICD-10-CM | POA: Diagnosis not present

## 2016-10-14 DIAGNOSIS — N172 Acute kidney failure with medullary necrosis: Secondary | ICD-10-CM | POA: Diagnosis not present

## 2016-10-14 DIAGNOSIS — T8611 Kidney transplant rejection: Secondary | ICD-10-CM | POA: Diagnosis not present

## 2016-10-14 DIAGNOSIS — E1122 Type 2 diabetes mellitus with diabetic chronic kidney disease: Secondary | ICD-10-CM | POA: Diagnosis not present

## 2016-10-14 DIAGNOSIS — I69322 Dysarthria following cerebral infarction: Secondary | ICD-10-CM | POA: Diagnosis not present

## 2016-10-14 DIAGNOSIS — I739 Peripheral vascular disease, unspecified: Secondary | ICD-10-CM | POA: Diagnosis not present

## 2016-10-14 DIAGNOSIS — G8111 Spastic hemiplegia affecting right dominant side: Secondary | ICD-10-CM | POA: Insufficient documentation

## 2016-10-14 DIAGNOSIS — Z794 Long term (current) use of insulin: Secondary | ICD-10-CM | POA: Insufficient documentation

## 2016-10-14 DIAGNOSIS — Z833 Family history of diabetes mellitus: Secondary | ICD-10-CM | POA: Insufficient documentation

## 2016-10-14 DIAGNOSIS — I129 Hypertensive chronic kidney disease with stage 1 through stage 4 chronic kidney disease, or unspecified chronic kidney disease: Secondary | ICD-10-CM | POA: Insufficient documentation

## 2016-10-14 DIAGNOSIS — Z8249 Family history of ischemic heart disease and other diseases of the circulatory system: Secondary | ICD-10-CM | POA: Diagnosis not present

## 2016-10-14 DIAGNOSIS — N189 Chronic kidney disease, unspecified: Secondary | ICD-10-CM | POA: Insufficient documentation

## 2016-10-14 NOTE — Progress Notes (Signed)
Dysport Injection for spasticity using needle EMG guidance  Dilution: 200 Units/ml Indication: Severe spasticity which interferes with ADL,mobility and/or  hygiene and is unresponsive to medication management and other conservative care Informed consent was obtained after describing risks and benefits of the procedure with the patient. This includes bleeding, bruising, infection, excessive weakness, or medication side effects. A REMS form is on file and signed. Needle:  needle electrode Number of units per muscle Biceps 400 units FCR, 200 units. FDS 200 units. Pronator teres 100 units. PQ, 100 units All injections were done after obtaining appropriate EMG activity and after negative drawback for blood. The patient tolerated the procedure well. Post procedure instructions were given. A followup appointment was made.

## 2016-10-14 NOTE — Patient Instructions (Signed)

## 2016-10-29 ENCOUNTER — Other Ambulatory Visit: Payer: Self-pay | Admitting: *Deleted

## 2016-10-29 MED ORDER — SERTRALINE HCL 100 MG PO TABS: 100.0000 mg | ORAL_TABLET | Freq: Every day | ORAL | 1 refills | Status: DC

## 2016-10-30 ENCOUNTER — Other Ambulatory Visit: Payer: Self-pay | Admitting: *Deleted

## 2016-10-31 NOTE — Telephone Encounter (Signed)
2nd request.  Ayjah Show L, RN  

## 2016-11-03 MED ORDER — INSULIN DETEMIR 100 UNIT/ML ~~LOC~~ SOLN: 27.0000 [IU] | Freq: Two times a day (BID) | SUBCUTANEOUS | 0 refills | Status: DC

## 2016-11-03 MED ORDER — INSULIN DETEMIR 100 UNIT/ML FLEXPEN: 27.0000 [IU] | PEN_INJECTOR | Freq: Two times a day (BID) | SUBCUTANEOUS | 5 refills | Status: DC

## 2016-11-03 MED ORDER — METFORMIN HCL 1000 MG PO TABS: 1000.0000 mg | ORAL_TABLET | Freq: Two times a day (BID) | ORAL | 0 refills | Status: DC

## 2016-11-03 NOTE — Addendum Note (Signed)
Addended by: Derl Barrow on: 11/03/2016 04:33 PM   Modules accepted: Orders

## 2016-11-06 ENCOUNTER — Ambulatory Visit: Payer: Medicare Other | Attending: Physical Medicine & Rehabilitation | Admitting: Physical Therapy

## 2016-11-06 ENCOUNTER — Ambulatory Visit: Payer: Medicare Other | Admitting: Occupational Therapy

## 2016-11-06 DIAGNOSIS — I679 Cerebrovascular disease, unspecified: Secondary | ICD-10-CM | POA: Diagnosis not present

## 2016-11-06 DIAGNOSIS — M6281 Muscle weakness (generalized): Secondary | ICD-10-CM | POA: Diagnosis not present

## 2016-11-06 DIAGNOSIS — R2681 Unsteadiness on feet: Secondary | ICD-10-CM | POA: Diagnosis not present

## 2016-11-06 DIAGNOSIS — R2689 Other abnormalities of gait and mobility: Secondary | ICD-10-CM | POA: Diagnosis not present

## 2016-11-06 DIAGNOSIS — I69351 Hemiplegia and hemiparesis following cerebral infarction affecting right dominant side: Secondary | ICD-10-CM | POA: Insufficient documentation

## 2016-11-06 NOTE — Therapy (Signed)
[facility] [address] [place], [place], [address] Phone: [phone]   Fax:  [phone]  Physical Therapy Evaluation  Patient Details  Name: [name] MRN: [id number] Date of Birth: July 22, 1950 Referring Provider: Dr. Alysia Penna  Encounter Date: 11/06/2016      PT End of Session - 11/06/16 1420    Visit Number 1   Number of Visits 9   Date for PT Re-Evaluation 01/05/17   Authorization Type Medicare Traditional primary-GCODE every 10th visit   PT Start Time 1102   PT Stop Time 1142   PT Time Calculation (min) 40 min   Activity Tolerance Patient tolerated treatment well   Behavior During Therapy [facility] for tasks assessed/performed      Past Medical History:  Diagnosis Date  . 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 CVA (cerebral infarction) 01/03/2008   MRI: Acute 1 x 1.5 cm infarction affecting the left side of the pons.  . Diabetes mellitus   . DVT (deep venous thrombosis) ([place])   . Hypertension   . PAD (peripheral artery disease) ([place])   . Stroke [facility])    2008    Past Surgical History:  Procedure Laterality Date  . COLONOSCOPY    . None      There were no vitals filed for this visit.       Subjective Assessment - 11/06/16 1108    Subjective Pt had new CVA in January 2018.  Pt has history of CVA with R spastic hemiparesis in 2009.  He walks with [facility], and would like to walk faster.  Reports no falls.  He feels like this stroke affected speech as well.   Pertinent History CVA 2009, DM, DVT, HTN, PAD, L ACA infarct 06/2016   Patient Stated Goals Pt's goal for therapy is to be able to walk faster.   Currently in Pain? No/denies            [facility] PT Assessment - 11/06/16 1110      Assessment   Medical Diagnosis CVA, R spastic hemiparesis   Referring Provider Dr. Alysia Penna   Onset Date/Surgical Date 10/14/16  MD visit; CVA 07/01/16     Precautions   Precautions  Fall     Balance Screen   Has the patient fallen in the past 6 months No   Has the patient had a decrease in activity level because of a fear of falling?  No   Is the patient reluctant to leave their home because of a fear of falling?  No     Home Environment   Living Environment Private residence   Living Arrangements Alone  family checks in often   Type of [place] - quad;Shower seat;Grab bars - tub/shower     Prior Function   Level of Independence Independent with basic ADLs;Independent with household mobility with device  with equipment   Vocation Retired;On disability   Leisure Pt enjoys going to [employer] to use exercise room (hasn't been since last year)     Observation/Other Assessments   Focus on Therapeutic Outcomes (FOTO)  NA     ROM / Strength   AROM / PROM / Strength Strength     Strength   Overall Strength Deficits   Strength Assessment Site Hip;Knee;Ankle   Right/Left Hip Right;Left   Right Hip Flexion 3-/5   Left Hip Flexion  4/5   Right/Left Knee Right;Left   Right Knee Flexion 3-/5   Right Knee Extension 3/5  trunk lean posteriorly   Left Knee Flexion 4+/5   Left Knee Extension 4/5   Right/Left Ankle Right;Left   Right Ankle Dorsiflexion 3-/5   Left Ankle Dorsiflexion 4+/5     Transfers   Transfers Sit to Stand;Stand to Sit   Sit to Stand 6: Modified independent (Device/Increase time);With upper extremity assist;From chair/3-in-1   Stand to Sit 6: Modified independent (Device/Increase time);With upper extremity assist;To chair/3-in-1     Ambulation/Gait   Ambulation/Gait Yes   Ambulation/Gait Assistance 6: Modified independent (Device/Increase time);5: Supervision   Ambulation Distance (Feet) 80 Feet   Assistive device Large base quad cane   Gait Pattern Step-through pattern;Decreased arm swing - right;Decreased step length - right;Decreased stance time -  right;Decreased dorsiflexion - right;Decreased weight shift to right   Ambulation Surface Level;Indoor   Gait velocity 30.09 sec = 1.09 ft/sec     Standardized Balance Assessment   Standardized Balance Assessment Berg Balance Test;Timed Up and Go Test     Berg Balance Test   Sit to Stand Able to stand  independently using hands   Standing Unsupported Able to stand safely 2 minutes   Sitting with Back Unsupported but Feet Supported on Floor or Stool Able to sit safely and securely 2 minutes   Stand to Sit Controls descent by using hands   Transfers Able to transfer safely, definite need of hands   Standing Unsupported with Eyes Closed Able to stand 10 seconds with supervision   Standing Ubsupported with Feet Together Able to place feet together independently and stand for 1 minute with supervision   From Standing, Reach Forward with Outstretched Arm Can reach forward >12 cm safely (5")   From Standing Position, Pick up Object from Floor Able to pick up shoe, needs supervision   From Standing Position, Turn to Look Behind Over each Shoulder Looks behind one side only/other side shows less weight shift  looks to L >R   Turn 360 Degrees Needs close supervision or verbal cueing   Standing Unsupported, Alternately Place Feet on Step/Stool Needs assistance to keep from falling or unable to try   Standing Unsupported, One Foot in Front Able to take small step independently and hold 30 seconds   Standing on One Leg Tries to lift leg/unable to hold 3 seconds but remains standing independently   Total Score 36   Berg comment: Scores <45/56 indicate increased fall risk.     Timed Up and Go Test   Normal TUG (seconds) 38.54   TUG Comments Scores >13.5 sec indicates increased fall risk; scores >30 seconds indicate increased difficulty with ADLs in home.            Objective measurements completed on examination: See above findings.                       PT Long Term Goals  - 11/06/16 1428      PT LONG TERM GOAL #1   Title Pt will be independent with HEP for improved balance, strength, and gait.  TARGET 12/05/16   Time 4   Period Weeks   Status New     PT LONG TERM GOAL #2   Title Pt will improve Berg Balance score to at least 41/56 for decreased fall risk.   Time 4   Period Weeks   Status New     PT LONG  TERM GOAL #3   Title Pt will improve TUG score to less than or equal to 30 seconds for decreased fall risk.   Time 4   Period Weeks   Status New     PT LONG TERM GOAL #4   Title Pt will improve gait velocity to at least 1.6 ft/sec for improved gait efficiency and safety.   Time 4   Period Weeks   Status New     PT LONG TERM GOAL #5   Title Pt will verbalize plans for continued community fitness upon D/C from PT.   Time 4   Period Weeks   Status New     Additional Long Term Goals   Additional Long Term Goals Yes     PT LONG TERM GOAL #6   Title Pt will verbalize understanding of fall prevention in the home environment.     Time 4   Period Weeks   Status New                Plan - December 02, 2016 1421    Clinical Impression Statement PT is a 66 year old male who presents to OP PT with dx of R spastic hemiplegia (initial CVA 2009), recent CVA 06/2016.  Pt reports increased weakness since recent CVA as well as noted speech difficulties.  Pt presents with decreased balance, decreased strength, decreased gait velocity indicating fall risk.  Pt is at fall risk per Berg score of 36/56, TUG score of 38/54 sec and gait velocity of 1.09 ft/sec.  Pt has decreased Berg balance score from 42/56 and decreased gait velocity from 2.04 ft/sec from previous bout of therapy at end of summer 2017.  Pt would benefit from skilled PT to address strength, balance, and gait.   History and Personal Factors relevant to plan of care: CVA 2009, DM, DVT, HTN, PAD, L ACA infarct 06/2016   Clinical Presentation Stable   Clinical Presentation due to: No falls, recent CVA  06/2016, risk of falls per objective tests   Clinical Decision Making Low   Rehab Potential Good   PT Frequency 2x / week   PT Duration 4 weeks  plus eval   PT Treatment/Interventions ADLs/Self Care Home Management;Functional mobility training;Gait training;Therapeutic activities;Therapeutic exercise;Balance training;Neuromuscular re-education;Patient/family education;Orthotic Fit/Training   PT Next Visit Plan Initiate HEP to address lower extremity strength and balance; gait training in parallel bars with step through pattern   Recommended Other Services Pt may benefit from speech therapy to address patient-related speech changes since January 2018 CVA   Consulted and Agree with Plan of Care Patient      Patient will benefit from skilled therapeutic intervention in order to improve the following deficits and impairments:  Abnormal gait, Decreased balance, Decreased mobility, Difficulty walking, Decreased strength, Postural dysfunction, Impaired tone  Visit Diagnosis: Other abnormalities of gait and mobility  Unsteadiness on feet  Muscle weakness (generalized)      G-Codes - Dec 02, 2016 1431    Functional Assessment Tool Used (Outpatient Only) gt vel:  1.09 ft/sec, TUG 38.54 sec, Berg 36/56    Functional Limitation Mobility: Walking and moving around   Mobility: Walking and Moving Around Current Status [phone]) At least 40 percent but less than 60 percent impaired, limited or restricted   Mobility: Walking and Moving Around Goal Status [phone]) At least 20 percent but less than 40 percent impaired, limited or restricted       Problem List Patient Active Problem List   Diagnosis Date Noted  . Dysarthria  as late effect of stroke 08/18/2016  . Stage 2 chronic kidney disease   . Leukocytosis   . Benign essential HTN   . Spastic hemiparesis ([place])   . Aphasia due to recent cerebral infarction 07/04/2016  . Cerebrovascular accident (CVA) ([place])   . Altered mental status 06/30/2016  .  Fall 06/30/2016  . Acute encephalopathy 06/30/2016  . Sepsis ([place]) 06/30/2016  . Diarrhea 06/30/2016  . Acute renal failure superimposed on stage 2 chronic kidney disease ([place]) 06/30/2016  . Hemiparesis affecting right side as late effect of stroke ([place]) 10/23/2015  . PAD (peripheral artery disease) ([place]) 10/20/2011  . Hyperlipidemia 02/21/2010  . CEREBROVASCULAR ACCIDENT, HX OF 12/22/2008  . Diabetes mellitus, type II ([place]) 07/30/2006  . OBESITY, NOS 07/30/2006  . Essential hypertension, benign 07/30/2006    Frazier Butt. 11/06/2016, 2:32 PM  Frazier Butt., PT  [facility] [address] [place], [place], [address] Phone: [phone]   Fax:  [phone]  Name: [name] MRN: [id number] Date of Birth: Mar 11, 1951

## 2016-11-06 NOTE — Therapy (Signed)
[facility] [address] [place], [place], [address] Phone: [phone]   Fax:  [phone]  Occupational Therapy Evaluation  Patient Details  Name: [name] MRN: [id number] Date of Birth: 1951-03-31 Referring Provider: Dr. Alysia Penna  Encounter Date: 11/06/2016      OT End of Session - 11/06/16 1302    Visit Number 1   Number of Visits 9   Date for OT Re-Evaluation 12/06/16   Authorization Type MCR - G code needed   Authorization - Visit Number 1   Authorization - Number of Visits 10   OT Start Time 1020   OT Stop Time 1100   OT Time Calculation (min) 40 min   Activity Tolerance Patient tolerated treatment well      Past Medical History:  Diagnosis Date  . 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 CVA (cerebral infarction) 01/03/2008   MRI: Acute 1 x 1.5 cm infarction affecting the left side of the pons.  . Diabetes mellitus   . DVT (deep venous thrombosis) ([place])   . Hypertension   . PAD (peripheral artery disease) ([place])   . Stroke [facility])    2008    Past Surgical History:  Procedure Laterality Date  . COLONOSCOPY    . None      There were no vitals filed for this visit.      Subjective Assessment - 11/06/16 1033    Pertinent History spastic hemiplegia Rt side from CVA 2009, recent Lt ACA infarct 06/29/16 which pt reports affected speech more, recent botox injections 10/14/16 RUE: biceps, FCR, FDS, PT. PMH: DM, HTN   Limitations Rt AFO   Patient Stated Goals Get more use in my Rt arm   Currently in Pain? No/denies           [facility] OT Assessment - 11/06/16 0001      Assessment   Diagnosis Spastic hemiplegia Rt side  recent botox injections RUE 10/14/16   Referring Provider Dr. Alysia Penna   Onset Date --  CVA 2009 (Recent CVA 06/29/16 affected more speech)    Assessment dense Rt spastic hemiplegia, walks w/ quad cane, Rt AFO   Prior Therapy [facility] 2017,  recent [facility] therapies     Precautions   Precautions None  PER pt report     Balance Screen   Has the patient fallen in the past 6 months No   Has the patient had a decrease in activity level because of a fear of falling?  No   Is the patient reluctant to leave their home because of a fear of falling?  No     Home  Environment   Additional Comments Pt lives in 1 level home with ramped entry   Lives With Alone     Prior Function   Level of Independence Independent  prior to 2009   Vocation Retired;On disability     ADL   Eating/Feeding Modified independent  with Lt hand   Grooming Modified independent   Upper Body Bathing Modified independent   Lower Body Bathing Modified independent   Upper Body Dressing Increased time   Lower Body Dressing Increased time  with spyrolaces   Toilet Transfer Modified independent   Toileting - Clothing Manipulation Modified independent   Toileting -  Hygiene Modified Independent   Tub/Shower Transfer Modified independent   Warden/ranger Grab bars;Shower seat without back  (Like a bench)    ADL comments Has ADT (similar  to [employer])      IADL   Shopping Needs to be accompanied on any shopping trip   Light Housekeeping Does personal laundry completely;Performs light daily tasks such as dishwashing, bed making  family does heavy cleaning every once in a while   Meal Prep Plans, prepares and serves adequate meals independently  ? 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 with assistance of another;Relies on family or friends for transportation   Medication Management Is responsible for taking medication in correct dosages at correct time   Financial Management Dependent  daughter performs     Mobility   Mobility Status Independent  with quad cane      Written Expression   Dominant Hand Left  since 2009     Vision - History   Baseline Vision Wears glasses only for reading   Visual History Retinopathy    Additional Comments laser surgery     Observation/Other Assessments   Observations dysarthria and mild expressive aphasia, slower to get words out     Sensation   Additional Comments not tested     Coordination   Coordination Pt has no functional coordination Rt hand. Pt only able to grasp top of lightweight items w/ max RUE compensations     Edema   Edema mild to moderate Rt hand     Tone   Assessment Location Right Upper Extremity;Right Lower Extremity     ROM / Strength   AROM / PROM / Strength AROM     AROM   Overall AROM Comments LUE WNL's. RUE dominated by synergy pattern using inconsistently at [place]. RUE A/ROM approx. 60* sh. abduction when attempting flexion. Elbow flex approx 50%, ext approx 75% into gravity. Forearm supination to neutral, wrist ext 50%, min thumb abd. and no finger extension     RUE Tone   RUE Tone Moderate;Modified Ashworth     RUE Tone   Modified Ashworth Scale for Grading Hypertonia RUE More marked increase in muscle tone through most of the ROM, but affected part(s) easily moved  3/4 Rt hand     RLE Tone   RLE Tone Hypertonic                              OT Long Term Goals - 11/06/16 1308      OT LONG TERM GOAL #1   Title Independent with HEP (LTG's due 12/06/16)    Time 4   Period Weeks   Status New     OT LONG TERM GOAL #2   Title Independent with splint wear and care    Time 4   Period Weeks   Status New     OT LONG TERM GOAL #3   Title Pt to verbalize understanding with use and acquisition of A/E to increase ease and safety with cooking tasks (one handed can opener, cutting board, pot holder, rocker knife, etc)    Time 4   Period Weeks   Status New     OT LONG TERM GOAL #4   Title Pt to use RUE as stabalizer 50% of the time for bilateral tasks   Time 4   Period Weeks   Status New               Plan - 11/06/16 1303    Clinical Impression Statement Pt is a 66 y.o. male who presents to  outpatient rehab with Rt side spastic  hemiplegia from CVA in 2009, recent botox injections on 10/14/16 to Rt biceps, FCR, FDS, Pronator teres. Pt also had more recent CVA 06/29/16 (Lt ACA infarct) and d/c from inpatient rehab 07/18/16, however pt reports this affected speech more.    Occupational Profile and client history currently impacting functional performance CVA 2018, CVA 2009, spastic hemiplegia, recent botox 10/14/16   Occupational performance deficits (Please refer to evaluation for details): IADL's;Rest and Sleep;Social Participation   Rehab Potential Fair   Current Impairments/barriers affecting progress: time since initial onset of symptoms   OT Frequency 2x / week   OT Duration 4 weeks  plus eval   OT Treatment/Interventions Moist Heat;DME and/or AE instruction;Splinting;Patient/family education;Therapeutic exercises;Therapeutic activities;Neuromuscular education;Passive range of motion;Manual Therapy;Electrical Stimulation   Plan resting hand splint for pm (following session: splint check, A/E recommendations for cooking)   Clinical Decision Making Several treatment options, min-mod task modification necessary   Consulted and Agree with Plan of Care Patient      Patient will benefit from skilled therapeutic intervention in order to improve the following deficits and impairments:  Decreased range of motion, Impaired flexibility, Impaired tone, Impaired UE functional use, Pain, Decreased knowledge of use of DME, Impaired sensation, Decreased coordination  Visit Diagnosis: Spastic hemiplegia of right dominant side as late effect of cerebral infarction ([place]) - Plan: Ot plan of care cert/re-cert      G-Codes - 60/73/71 1310    Functional Assessment Tool Used (Outpatient only) clinical judgement RUE   Functional Limitation Changing and maintaining body position   Changing and Maintaining Body Position Current Status (G6269) At least 40 percent but less than 60 percent impaired, limited  or restricted   Changing and Maintaining Body Position Goal Status (S8546) At least 20 percent but less than 40 percent impaired, limited or restricted      Problem List Patient Active Problem List   Diagnosis Date Noted  . Dysarthria as late effect of stroke 08/18/2016  . Stage 2 chronic kidney disease   . Leukocytosis   . Benign essential HTN   . Spastic hemiparesis ([place])   . Aphasia due to recent cerebral infarction 07/04/2016  . Cerebrovascular accident (CVA) ([place])   . Altered mental status 06/30/2016  . Fall 06/30/2016  . Acute encephalopathy 06/30/2016  . Sepsis ([place]) 06/30/2016  . Diarrhea 06/30/2016  . Acute renal failure superimposed on stage 2 chronic kidney disease ([place]) 06/30/2016  . Hemiparesis affecting right side as late effect of stroke ([place]) 10/23/2015  . PAD (peripheral artery disease) ([place]) 10/20/2011  . Hyperlipidemia 02/21/2010  . CEREBROVASCULAR ACCIDENT, HX OF 12/22/2008  . Diabetes mellitus, type II ([place]) 07/30/2006  . OBESITY, NOS 07/30/2006  . Essential hypertension, benign 07/30/2006    Carey Bullocks, OTR/L 11/06/2016, 1:14 PM  [facility] [address] [place], [place], [address] Phone: [phone]   Fax:  [phone]  Name: JAHVON GOSLINE MRN: [id number] Date of Birth: September 03, 1950

## 2016-11-13 ENCOUNTER — Encounter: Payer: Self-pay | Admitting: *Deleted

## 2016-11-13 ENCOUNTER — Ambulatory Visit: Payer: Medicare Other | Admitting: Physical Therapy

## 2016-11-13 ENCOUNTER — Ambulatory Visit: Payer: Medicare Other | Admitting: *Deleted

## 2016-11-13 DIAGNOSIS — I679 Cerebrovascular disease, unspecified: Secondary | ICD-10-CM | POA: Diagnosis not present

## 2016-11-13 DIAGNOSIS — R2681 Unsteadiness on feet: Secondary | ICD-10-CM | POA: Diagnosis not present

## 2016-11-13 DIAGNOSIS — I69351 Hemiplegia and hemiparesis following cerebral infarction affecting right dominant side: Secondary | ICD-10-CM | POA: Diagnosis not present

## 2016-11-13 DIAGNOSIS — M6281 Muscle weakness (generalized): Secondary | ICD-10-CM

## 2016-11-13 DIAGNOSIS — R2689 Other abnormalities of gait and mobility: Secondary | ICD-10-CM

## 2016-11-13 NOTE — Patient Instructions (Addendum)
Side-Stepping    Holding to the counter:  Walk to left side with eyes open. Take even steps, leading with same foot. Make sure each foot lifts off the floor and try to keep your foot pointing forward as much as possible. Repeat to the right side. Repeat for __3-5 times the length of your counter.  Copyright  VHI. All rights reserved.  ABDUCTION: Standing (Active)    Holding to the counter, kick right leg out to side. (Make sure to keep your foot pointing forward).  Repeat 10 times.  Then repeat with left leg 10 times.  Complete _1-2__ sets of _10__ repetitions. Perform _1__ sessions per day.  http://gtsc.exer.us/110   Copyright  VHI. All rights reserved.  Back Leg Kick    Swing leg back as far as possible. Return to center. Repeat with other leg. Repeat __10__ times each leg. Do __1__ sessions per day.  http://gt2.exer.us/483   Copyright  VHI. All rights reserved.  "I love a Parade" Lift    Standing beside the counter, march in place, alternating your legs. Repeat __10__ times. Do _1___ sessions per day.  http://gt2.exer.us/344   Copyright  VHI. All rights reserved.  Mini Squat: Double Leg    With feet shoulder width apart, standing at the counter, do a mini squat. Keep knees in line with second toe. Knees do not go past toes. Make sure to have equal weight through both legs.  Repeat _10__ times per set.   http://plyo.exer.us/70   Copyright  VHI. All rights reserved.

## 2016-11-13 NOTE — Therapy (Signed)
[facility] [address] [place], [place], [address] Phone: [phone]   Fax:  [phone]  Occupational Therapy Treatment  Patient Details  Name: [name] MRN: [id number] Date of Birth: April 24, 1951 Referring Provider: Dr. Alysia Penna  Encounter Date: 11/13/2016      OT End of Session - 11/13/16 1001    Visit Number 2   Date for OT Re-Evaluation 12/06/16   Authorization Type MCR - G code needed   Authorization - Visit Number 2   Authorization - Number of Visits 10   OT Start Time 0801   OT Stop Time 0844   OT Time Calculation (min) 43 min   Activity Tolerance Patient tolerated treatment well   Behavior During Therapy [facility] for tasks assessed/performed      Past Medical History:  Diagnosis Date  . 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 CVA (cerebral infarction) 01/03/2008   MRI: Acute 1 x 1.5 cm infarction affecting the left side of the pons.  . Diabetes mellitus   . DVT (deep venous thrombosis) ([place])   . Hypertension   . PAD (peripheral artery disease) ([place])   . Stroke [facility])    2008    Past Surgical History:  Procedure Laterality Date  . COLONOSCOPY    . None      There were no vitals filed for this visit.      Subjective Assessment - 11/13/16 0810    Subjective  "I need a strong cup of coffee" Pt presents to clinic today, denies any changes since last visit.   Pertinent History spastic hemiplegia Rt side from CVA 2009, recent Lt ACA infarct 06/29/16 which pt reports affected speech more, recent botox injections 10/14/16 RUE: biceps, FCR, FDS, PT. PMH: DM, HTN   Limitations Rt AFO   Patient Stated Goals Get more use in my Rt arm   Currently in Pain? No/denies                      OT Treatments/Exercises (OP) - 11/13/16 0001      ADLs   ADL Comments Pt was educated to remove custom volar resting hand splint for ADL's and all wear, care and precautions were  reviewed with pt. He verbalized understanding and will bring splint to next visit for PRN adjustments.     Splinting   Splinting A custom resting hand splint was fabricated in clinic today for R UE. This splint is volar based and places hand in functional resting position. Pt was educated in splinting use, care and precautions. He will gradually increase wear during the day in 30-60min  intervals and gradually wear at noc. He was able to don/doff splint in clinic today and verbalized understanding of wear and care in clinic.       SPLINT WEAR AND CARE:    Your Splint This splint should initially be fitted by a healthcare practitioner.  The healthcare practitioner is responsible for providing wearing instructions and precautions to the patient, other healthcare practitioners and care provider involved in the patient's care.  This splint was custom made for you. Please read the following instructions to learn about wearing and caring for your splint.  Precautions Should your splint cause any of the following problems, remove the splint immediately and contact your therapist/physician.  Swelling  Severe Pain  Pressure Areas  Stiffness  Numbness  Do not wear your splint while operating machinery unless it has been fabricated for that  purpose.  When To Wear Your Splint Where your splint according to your therapist/physician instructions. Begin by wearing 1-2 hours at a time during the day. If no problems, gradually increase time over the next few days (during waking hours). Once you can tolerate 5 consecutive hours with no problems, you can switch to wearing more at night. If you have any problems with the splint, TAKE OFF, and bring to therapy for adjustments  Care and Cleaning of Your Splint 1. Keep your splint away from open flames. 2. Your splint will lose its shape in temperatures over 135 degrees Farenheit, ( in car windows, near radiators, ovens or in hot water).  Never make any  adjustments to your splint, if the splint needs adjusting remove it and make an appointment to see your therapist. 3. Your splint may be cleaned with rubbing alcohol.  Clean 1-2x/day. Do not immerse in hot water over 135 degrees Farenheit. 4. Straps may be washed with soap and water, but do not moisten the self-adhesive portion. For ink or hard to remove spots use a scouring cleanser which contains chlorine.  Rinse the splint thoroughly after using chlorine cleanser.           OT Education - 11/13/16 [phone]    Education provided Yes   Education Details splint wear and care   Person(s) Educated Patient   Methods Explanation;Demonstration;Handout;Verbal cues   Comprehension Verbalized understanding;Verbal cues required             OT Long Term Goals - 11/06/16 1308      OT LONG TERM GOAL #1   Title Independent with HEP (LTG's due 12/06/16)    Time 4   Period Weeks   Status New     OT LONG TERM GOAL #2   Title Independent with splint wear and care    Time 4   Period Weeks   Status New     OT LONG TERM GOAL #3   Title Pt to verbalize understanding with use and acquisition of A/E to increase ease and safety with cooking tasks (one handed can opener, cutting board, pot holder, rocker knife, etc)    Time 4   Period Weeks   Status New     OT LONG TERM GOAL #4   Title Pt to use RUE as stabalizer 50% of the time for bilateral tasks   Time 4   Period Weeks   Status New               Plan - 11/13/16 1001    Clinical Impression Statement Pt should benefit from custom volar resting hand splint as fabricated in clinic today. He will bring to next visit for adjustments as needed. Goal is to eventually wear on RUE at noc. to assist with positioning and decreasing tone after increasing time during the day. w/o any pressure areas or skin issues noted.   Occupational Profile and client history currently impacting functional performance CVA 2018, CVA 2009, spastic hemiplegia,  recent botox 10/14/16   Rehab Potential Fair   Current Impairments/barriers affecting progress: time since initial onset of symptoms   OT Frequency 2x / week   OT Duration 4 weeks  plus Eval   OT Treatment/Interventions Moist Heat;DME and/or AE instruction;Splinting;Patient/family education;Therapeutic exercises;Therapeutic activities;Neuromuscular education;Passive range of motion;Manual Therapy;Electrical Stimulation   Plan Check resting hand splint and adjust PRN RUE. A/E recommendations for cooking.   Consulted and Agree with Plan of Care Patient      Patient will benefit  from skilled therapeutic intervention in order to improve the following deficits and impairments:  Decreased range of motion, Impaired flexibility, Impaired tone, Impaired UE functional use, Pain, Decreased knowledge of use of DME, Impaired sensation, Decreased coordination  Visit Diagnosis: Spastic hemiplegia of right dominant side as late effect of cerebral infarction ([facility])  Muscle weakness (generalized)  Cerebrovascular small vessel disease  Unsteadiness on feet    Problem List Patient Active Problem List   Diagnosis Date Noted  . Dysarthria as late effect of stroke 08/18/2016  . Stage 2 chronic kidney disease   . Leukocytosis   . Benign essential HTN   . Spastic hemiparesis ([place])   . Aphasia due to recent cerebral infarction 07/04/2016  . Cerebrovascular accident (CVA) ([place])   . Altered mental status 06/30/2016  . Fall 06/30/2016  . Acute encephalopathy 06/30/2016  . Sepsis ([place]) 06/30/2016  . Diarrhea 06/30/2016  . Acute renal failure superimposed on stage 2 chronic kidney disease ([place]) 06/30/2016  . Hemiparesis affecting right side as late effect of stroke ([place]) 10/23/2015  . PAD (peripheral artery disease) ([place]) 10/20/2011  . Hyperlipidemia 02/21/2010  . CEREBROVASCULAR ACCIDENT, HX OF 12/22/2008  . Diabetes mellitus, type II ([place]) 07/30/2006  . OBESITY, NOS 07/30/2006  . Essential  hypertension, benign 07/30/2006    Percell Miller Ardath Sax, OTR/L 11/13/2016, 10:07 AM  [facility] [address] [place], [place], [address] Phone: [phone]   Fax:  [phone]  Name: [name] MRN: [id number] Date of Birth: 1951/05/30

## 2016-11-13 NOTE — Patient Instructions (Signed)
SPLINT WEAR AND CARE:    Your Splint This splint should initially be fitted by a healthcare practitioner.  The healthcare practitioner is responsible for providing wearing instructions and precautions to the patient, other healthcare practitioners and care provider involved in the patient's care.  This splint was custom made for you. Please read the following instructions to learn about wearing and caring for your splint.  Precautions Should your splint cause any of the following problems, remove the splint immediately and contact your therapist/physician.  Swelling  Severe Pain  Pressure Areas  Stiffness  Numbness  Do not wear your splint while operating machinery unless it has been fabricated for that purpose.  When To Wear Your Splint Where your splint according to your therapist/physician instructions. Begin by wearing 1-2 hours at a time during the day. If no problems, gradually increase time over the next few days (during waking hours). Once you can tolerate 5 consecutive hours with no problems, you can switch to wearing more at night. If you have any problems with the splint, TAKE OFF, and bring to therapy for adjustments  Care and Cleaning of Your Splint 1. Keep your splint away from open flames. 2. Your splint will lose its shape in temperatures over 135 degrees Farenheit, ( in car windows, near radiators, ovens or in hot water).  Never make any adjustments to your splint, if the splint needs adjusting remove it and make an appointment to see your therapist. 3. Your splint may be cleaned with rubbing alcohol.  Clean 1-2x/day. Do not immerse in hot water over 135 degrees Farenheit. 4. Straps may be washed with soap and water, but do not moisten the self-adhesive portion. 5. For ink or hard to remove spots use a scouring cleanser which contains chlorine.  Rinse the splint thoroughly after using chlorine cleanser.

## 2016-11-13 NOTE — Therapy (Signed)
[facility] [address] [place], [place], [address] Phone: [phone]   Fax:  [phone]  Physical Therapy Treatment  Patient Details  Name: [name] MRN: [id number] Date of Birth: 09/17/1950 Referring Provider: Dr. Alysia Penna  Encounter Date: 11/13/2016      PT End of Session - 11/13/16 0939    Visit Number 2   Number of Visits 9   Date for PT Re-Evaluation 01/05/17   Authorization Type Medicare Traditional primary-GCODE every 10th visit   PT Start Time 0846   PT Stop Time 0929   PT Time Calculation (min) 43 min   Equipment Utilized During Treatment Gait belt   Activity Tolerance Patient tolerated treatment well   Behavior During Therapy [facility] for tasks assessed/performed      Past Medical History:  Diagnosis Date  . 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 CVA (cerebral infarction) 01/03/2008   MRI: Acute 1 x 1.5 cm infarction affecting the left side of the pons.  . Diabetes mellitus   . DVT (deep venous thrombosis) ([place])   . Hypertension   . PAD (peripheral artery disease) ([place])   . Stroke [facility])    2008    Past Surgical History:  Procedure Laterality Date  . COLONOSCOPY    . None      There were no vitals filed for this visit.      Subjective Assessment - 11/13/16 0847    Subjective No changes since eval last week.  Just reports being tired today.   Pertinent History CVA 2009, DM, DVT, HTN, PAD, L ACA infarct 06/2016   Patient Stated Goals Pt's goal for therapy is to be able to walk faster.   Currently in Pain? No/denies                         [facility] Adult PT Treatment/Exercise - 11/13/16 0001      High Level Balance   High Level Balance Activities Side stepping  Cues for foot clearance and placement, 3x length of bars   High Level Balance Comments Marching in place 5 reps each side, then alternating marching x 5 reps; side hip kicks x 10 reps, then  back hip kicks x 10 reps, bilateral lower extremities with cues for technique     Neuro Re-ed    Neuro Re-ed Details  RLE as stance leg with forward/back step through with LLE x 10 reps, then RLE step taps to 2" block     Exercises   Exercises Knee/Hip     Knee/Hip Exercises: Aerobic   Stepper SciFit Seated Stepper, Level 1>1.8, lower extremities and LUE x 9 minutes, with RPM 35-40 for leg strengthening  Cues for increased use of RLE for hip/knee flex/extension    Neuro Re-education-continued: -Pt shows PT brief demo of current HEP:  Sidestepping along counter, RLE side hip kicks, RLE back hip kicks, minisquats. -With standing exercises as above, PT provided verbal and tactile cues for increased RLE weightshifting and upright posture.  Discussed benefits to adding side hip kicks and back hip kicks with LLE to promote RLE weightbearing; cues for sidestepping to reduce R hip external rotation. -Forward/back walking in parallel bars, 3 reps, with therapist manual cues at hips for increased step length and foot clearance, increased RLE weightshifting.             PT Education - 11/13/16 [phone]    Education provided Yes   Education  Details Updates/additions to HEP   Person(s) Educated Patient   Methods Explanation;Demonstration;Verbal cues;Handout   Comprehension Verbalized understanding;Returned demonstration;Verbal cues required;Need further instruction             PT Long Term Goals - 11/06/16 1428      PT LONG TERM GOAL #1   Title Pt will be independent with HEP for improved balance, strength, and gait.  TARGET 12/05/16   Time 4   Period Weeks   Status New     PT LONG TERM GOAL #2   Title Pt will improve Berg Balance score to at least 41/56 for decreased fall risk.   Time 4   Period Weeks   Status New     PT LONG TERM GOAL #3   Title Pt will improve TUG score to less than or equal to 30 seconds for decreased fall risk.   Time 4   Period Weeks   Status New     PT  LONG TERM GOAL #4   Title Pt will improve gait velocity to at least 1.6 ft/sec for improved gait efficiency and safety.   Time 4   Period Weeks   Status New     PT LONG TERM GOAL #5   Title Pt will verbalize plans for continued community fitness upon D/C from PT.   Time 4   Period Weeks   Status New     Additional Long Term Goals   Additional Long Term Goals Yes     PT LONG TERM GOAL #6   Title Pt will verbalize understanding of fall prevention in the home environment.     Time 4   Period Weeks   Status New               Plan - 11/13/16 0160    Clinical Impression Statement Skilled PT session focused today on modifications/additions to pt's previous HHPT HEP.  Stressed the importance of bilateral lower extremity exercises, in order to promote increased weightshfiting and weightbearing through RLE.  Pt will continue to benefit from further skilled PT to address strength, balance, and gait for improved functional mobility.   Rehab Potential Good   PT Frequency 2x / week   PT Duration 4 weeks  plus eval   PT Treatment/Interventions ADLs/Self Care Home Management;Functional mobility training;Gait training;Therapeutic activities;Therapeutic exercise;Balance training;Neuromuscular re-education;Patient/family education;Orthotic Fit/Training   PT Next Visit Plan Review HEP, continue to work on gait training in parallel bars and with cane   Consulted and Agree with Plan of Care Patient      Patient will benefit from skilled therapeutic intervention in order to improve the following deficits and impairments:  Abnormal gait, Decreased balance, Decreased mobility, Difficulty walking, Decreased strength, Postural dysfunction, Impaired tone  Visit Diagnosis: Other abnormalities of gait and mobility  Muscle weakness (generalized)  Unsteadiness on feet     Problem List Patient Active Problem List   Diagnosis Date Noted  . Dysarthria as late effect of stroke 08/18/2016  .  Stage 2 chronic kidney disease   . Leukocytosis   . Benign essential HTN   . Spastic hemiparesis ([place])   . Aphasia due to recent cerebral infarction 07/04/2016  . Cerebrovascular accident (CVA) ([place])   . Altered mental status 06/30/2016  . Fall 06/30/2016  . Acute encephalopathy 06/30/2016  . Sepsis ([place]) 06/30/2016  . Diarrhea 06/30/2016  . Acute renal failure superimposed on stage 2 chronic kidney disease ([place]) 06/30/2016  . Hemiparesis affecting right side as late effect of  stroke ([place]) 10/23/2015  . PAD (peripheral artery disease) ([place]) 10/20/2011  . Hyperlipidemia 02/21/2010  . CEREBROVASCULAR ACCIDENT, HX OF 12/22/2008  . Diabetes mellitus, type II ([place]) 07/30/2006  . OBESITY, NOS 07/30/2006  . Essential hypertension, benign 07/30/2006    Frazier Butt. 11/13/2016, 9:44 AM Frazier Butt., PT  [facility] [address] [place], [place], [address] Phone: [phone]   Fax:  [phone]  Name: [name] MRN: [id number] Date of Birth: 1950-12-31

## 2016-11-18 ENCOUNTER — Ambulatory Visit: Payer: Medicare Other | Admitting: Occupational Therapy

## 2016-11-18 DIAGNOSIS — R2681 Unsteadiness on feet: Secondary | ICD-10-CM | POA: Diagnosis not present

## 2016-11-18 DIAGNOSIS — I69351 Hemiplegia and hemiparesis following cerebral infarction affecting right dominant side: Secondary | ICD-10-CM

## 2016-11-18 DIAGNOSIS — R2689 Other abnormalities of gait and mobility: Secondary | ICD-10-CM | POA: Diagnosis not present

## 2016-11-18 DIAGNOSIS — M6281 Muscle weakness (generalized): Secondary | ICD-10-CM | POA: Diagnosis not present

## 2016-11-18 DIAGNOSIS — I679 Cerebrovascular disease, unspecified: Secondary | ICD-10-CM | POA: Diagnosis not present

## 2016-11-18 NOTE — Therapy (Signed)
[facility] [address] [place], [place], [address] Phone: [phone]   Fax:  [phone]  Occupational Therapy Treatment  Patient Details  Name: [name] MRN: [id number] Date of Birth: [birth year] Referring Provider: Dr. Alysia Penna  Encounter Date: 11/18/2016      OT End of Session - 11/18/16 0901    Visit Number 3   Number of Visits 9   Date for OT Re-Evaluation 12/06/16   Authorization Type MCR - G code needed   Authorization - Visit Number 3   Authorization - Number of Visits 10   OT Start Time 0810  Pt arrived 10 min. late   OT Stop Time 0845   OT Time Calculation (min) 35 min      Past Medical History:  Diagnosis Date  . 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 CVA (cerebral infarction) 01/03/2008   MRI: Acute 1 x 1.5 cm infarction affecting the left side of the pons.  . Diabetes mellitus   . DVT (deep venous thrombosis) ([place])   . Hypertension   . PAD (peripheral artery disease) ([place])   . Stroke [facility])    2008    Past Surgical History:  Procedure Laterality Date  . COLONOSCOPY    . None      There were no vitals filed for this visit.      Subjective Assessment - 11/18/16 0809    Subjective  The splint is feeling fine, but my finger strap sometimes comes off when I have a muscle spasm   Pertinent History spastic hemiplegia Rt side from CVA 2009, recent Lt ACA infarct 06/29/16 which pt reports affected speech more, recent botox injections 10/14/16 RUE: biceps, FCR, FDS, PT. PMH: DM, HTN   Limitations Rt AFO   Patient Stated Goals Get more use in my Rt arm   Currently in Pain? No/denies                      OT Treatments/Exercises (OP) - 11/18/16 0001      ADLs   ADL Comments Pt shown A/E for one handed meal prep/cooking for greater ease and safety. Pt provided handouts for one handed cutting board, one handed rocker knife, one handed can opener, jar  opener, and pot holder and told how to purchase online     Splinting   Splinting Pt reports he wore splint last night, but finger strap was not staying on. Adjusted hooks and finger strap as well as wrist hook placement and strap. Reviewed wear and care of splint and how to properly don for best fit.                 OT Education - 11/18/16 0901    Education provided Yes   Education Details Review of splint wear and care, A/E recommendations   Person(s) Educated Patient   Methods Explanation;Handout   Comprehension Verbalized understanding             OT Long Term Goals - 11/18/16 0902      OT LONG TERM GOAL #1   Title Independent with HEP (LTG's due 12/06/16)    Time 4   Period Weeks   Status New     OT LONG TERM GOAL #2   Title Independent with splint wear and care    Time 4   Period Weeks   Status On-going     OT LONG TERM GOAL #3   Title Pt  to verbalize understanding with use and acquisition of A/E to increase ease and safety with cooking tasks (one handed can opener, cutting board, pot holder, rocker knife, etc)    Time 4   Period Weeks   Status On-going     OT LONG TERM GOAL #4   Title Pt to use RUE as stabalizer 50% of the time for bilateral tasks   Time 4   Period Weeks   Status New               Plan - 11/18/16 7001    Clinical Impression Statement Pt approximating 2 LTG's. Pt tolerating splint well and has better understanding of potential A/E needs.    Rehab Potential Fair   Current Impairments/barriers affecting progress: time since initial onset of symptoms   OT Frequency 2x / week   OT Duration 4 weeks   OT Treatment/Interventions Moist Heat;DME and/or AE instruction;Splinting;Patient/family education;Therapeutic exercises;Therapeutic activities;Neuromuscular education;Passive range of motion;Manual Therapy;Technical sales engineer use of rocker knife, show cutting board and practice as able, initiate HEP    Consulted  and Agree with Plan of Care Patient      Patient will benefit from skilled therapeutic intervention in order to improve the following deficits and impairments:  Decreased range of motion, Impaired flexibility, Impaired tone, Impaired UE functional use, Pain, Decreased knowledge of use of DME, Impaired sensation, Decreased coordination  Visit Diagnosis: Spastic hemiplegia of right dominant side as late effect of cerebral infarction [facility])    Problem List Patient Active Problem List   Diagnosis Date Noted  . Dysarthria as late effect of stroke 08/18/2016  . Stage 2 chronic kidney disease   . Leukocytosis   . Benign essential HTN   . Spastic hemiparesis ([place])   . Aphasia due to recent cerebral infarction 07/04/2016  . Cerebrovascular accident (CVA) ([place])   . Altered mental status 06/30/2016  . Fall 06/30/2016  . Acute encephalopathy 06/30/2016  . Sepsis ([place]) 06/30/2016  . Diarrhea 06/30/2016  . Acute renal failure superimposed on stage 2 chronic kidney disease ([place]) 06/30/2016  . Hemiparesis affecting right side as late effect of stroke ([place]) 10/23/2015  . PAD (peripheral artery disease) ([place]) 10/20/2011  . Hyperlipidemia 02/21/2010  . CEREBROVASCULAR ACCIDENT, HX OF 12/22/2008  . Diabetes mellitus, type II ([place]) 07/30/2006  . OBESITY, NOS 07/30/2006  . Essential hypertension, benign 07/30/2006    Carey Bullocks, OTR/L  11/18/2016, 9:04 AM  [facility] [address] [place], [place], [address] Phone: [phone]   Fax:  [phone]  Name: [name] MRN: [id number] Date of Birth: [birth year]

## 2016-11-20 ENCOUNTER — Ambulatory Visit: Payer: Medicare Other | Admitting: Occupational Therapy

## 2016-11-20 DIAGNOSIS — I69351 Hemiplegia and hemiparesis following cerebral infarction affecting right dominant side: Secondary | ICD-10-CM

## 2016-11-20 DIAGNOSIS — R2689 Other abnormalities of gait and mobility: Secondary | ICD-10-CM | POA: Diagnosis not present

## 2016-11-20 DIAGNOSIS — R2681 Unsteadiness on feet: Secondary | ICD-10-CM | POA: Diagnosis not present

## 2016-11-20 DIAGNOSIS — I679 Cerebrovascular disease, unspecified: Secondary | ICD-10-CM | POA: Diagnosis not present

## 2016-11-20 DIAGNOSIS — M6281 Muscle weakness (generalized): Secondary | ICD-10-CM | POA: Diagnosis not present

## 2016-11-20 NOTE — Therapy (Signed)
[facility] [address] [place], [place], [address] Phone: [phone]   Fax:  [phone]  Occupational Therapy Treatment  Patient Details  Name: [name] MRN: [id number] Date of Birth: 1950/12/25 Referring Provider: Dr. Alysia Penna  Encounter Date: 11/20/2016      OT End of Session - 11/20/16 1154    Visit Number 4   Number of Visits 9   Date for OT Re-Evaluation 12/06/16   Authorization Type MCR - G code needed   Authorization - Visit Number 4   Authorization - Number of Visits 10   OT Start Time 1105   OT Stop Time 1145   OT Time Calculation (min) 40 min   Activity Tolerance Patient tolerated treatment well      Past Medical History:  Diagnosis Date  . 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 CVA (cerebral infarction) 01/03/2008   MRI: Acute 1 x 1.5 cm infarction affecting the left side of the pons.  . Diabetes mellitus   . DVT (deep venous thrombosis) ([place])   . Hypertension   . PAD (peripheral artery disease) ([place])   . Stroke [facility])    2008    Past Surgical History:  Procedure Laterality Date  . COLONOSCOPY    . None      There were no vitals filed for this visit.      Subjective Assessment - 11/20/16 1114    Subjective  I like this cutting board (re: one handed cutting board). The splint fits and feels better since you made those adjustments   Pertinent History spastic hemiplegia Rt side from CVA 2009, recent Lt ACA infarct 06/29/16 which pt reports affected speech more, recent botox injections 10/14/16 RUE: biceps, FCR, FDS, PT. PMH: DM, HTN   Limitations Rt AFO   Patient Stated Goals Get more use in my Rt arm   Currently in Pain? No/denies                      OT Treatments/Exercises (OP) - 11/20/16 0001      ADLs   Eating Practiced cutting "food" with rocker knife   Cooking Practice cutting apple with use of one handed cutting board. Pt able to do  with just initial cueing for safety. Therapist did recommend different way to cut apple to prevent waste and pt verbalized understanding   ADL Comments Reviewed A/E recommendations and also discussed use of shelf liner under plate to prevent sliding when cutting food. Also recommended cutting circular piece to assist with opening jars. Pt also given additional handout for opening jars/containers/bottles     Neurological Re-education Exercises   Other Exercises 1 Pt performed AA/ROM with assist from Lt hand (table slides) for shoulder and elbow stretch to RUE   Other Exercises 2 Reviewed self stretches for sh. flexion (recommended performing in supine), supination, wrist extension, and finger extension. (due to time constraints, unable to issue formal HEP today - to do next session)                 OT Education - 11/20/16 1153    Education provided Yes   Education Details use of rocker knife, one handed cutting board, and self stretches to [employer]) Educated Patient   Methods Explanation;Demonstration   Comprehension Verbalized understanding;Returned demonstration             OT Long Term Goals - 11/20/16 1201      OT  LONG TERM GOAL #1   Title Independent with HEP (LTG's due 12/06/16)    Time 4   Period Weeks   Status On-going     OT LONG TERM GOAL #2   Title Independent with splint wear and care    Time 4   Period Weeks   Status Achieved     OT LONG TERM GOAL #3   Title Pt to verbalize understanding with use and acquisition of A/E to increase ease and safety with cooking tasks (one handed can opener, cutting board, pot holder, rocker knife, etc)    Time 4   Period Weeks   Status Achieved     OT LONG TERM GOAL #4   Title Pt to use RUE as stabalizer 50% of the time for bilateral tasks   Time 4   Period Weeks   Status New               Plan - 11/20/16 1201    Clinical Impression Statement Pt met LTG's #2 and #3. Approximating LTG #1.    Rehab  Potential Fair   Current Impairments/barriers affecting progress: time since initial onset of symptoms   OT Frequency 2x / week   OT Duration 4 weeks   OT Treatment/Interventions Moist Heat;DME and/or AE instruction;Splinting;Patient/family education;Therapeutic exercises;Therapeutic activities;Neuromuscular education;Passive range of motion;Manual Therapy;Electrical Stimulation   Plan Issue self assisted HEP and practice   Consulted and Agree with Plan of Care Patient      Patient will benefit from skilled therapeutic intervention in order to improve the following deficits and impairments:  Decreased range of motion, Impaired flexibility, Impaired tone, Impaired UE functional use, Pain, Decreased knowledge of use of DME, Impaired sensation, Decreased coordination  Visit Diagnosis: Spastic hemiplegia of right dominant side as late effect of cerebral infarction [facility])    Problem List Patient Active Problem List   Diagnosis Date Noted  . Dysarthria as late effect of stroke 08/18/2016  . Stage 2 chronic kidney disease   . Leukocytosis   . Benign essential HTN   . Spastic hemiparesis ([place])   . Aphasia due to recent cerebral infarction 07/04/2016  . Cerebrovascular accident (CVA) ([place])   . Altered mental status 06/30/2016  . Fall 06/30/2016  . Acute encephalopathy 06/30/2016  . Sepsis ([place]) 06/30/2016  . Diarrhea 06/30/2016  . Acute renal failure superimposed on stage 2 chronic kidney disease ([place]) 06/30/2016  . Hemiparesis affecting right side as late effect of stroke ([place]) 10/23/2015  . PAD (peripheral artery disease) ([place]) 10/20/2011  . Hyperlipidemia 02/21/2010  . CEREBROVASCULAR ACCIDENT, HX OF 12/22/2008  . Diabetes mellitus, type II ([place]) 07/30/2006  . OBESITY, NOS 07/30/2006  . Essential hypertension, benign 07/30/2006    Carey Bullocks, OTR/L 11/20/2016, 12:02 PM  [name] [address] [place], [place], [address] Phone: [phone]   Fax:  [phone]  Name: [name] MRN: [id number] Date of Birth: Mar 24, 1951

## 2016-11-24 ENCOUNTER — Ambulatory Visit: Payer: Medicare Other | Admitting: Occupational Therapy

## 2016-11-24 ENCOUNTER — Ambulatory Visit: Payer: Medicare Other | Admitting: Physical Therapy

## 2016-11-24 DIAGNOSIS — R2681 Unsteadiness on feet: Secondary | ICD-10-CM

## 2016-11-24 DIAGNOSIS — R2689 Other abnormalities of gait and mobility: Secondary | ICD-10-CM | POA: Diagnosis not present

## 2016-11-24 DIAGNOSIS — I69351 Hemiplegia and hemiparesis following cerebral infarction affecting right dominant side: Secondary | ICD-10-CM

## 2016-11-24 DIAGNOSIS — M6281 Muscle weakness (generalized): Secondary | ICD-10-CM

## 2016-11-24 DIAGNOSIS — I679 Cerebrovascular disease, unspecified: Secondary | ICD-10-CM | POA: Diagnosis not present

## 2016-11-24 NOTE — Patient Instructions (Addendum)
PERFORM ALL SLOWLY AND ONLY AS FAR AS YOU CAN WITHOUT PAIN!       SELF ASSISTED WITH OBJECT: Shoulder Flexion / Elbow Extension ([place])    Place one hand on cane or other assistive device. Move arm forward, straighten elbow. 10 reps per set,1-2 sets per day. Copyright  VHI. All rights reserved.  Supine Reach    Use stronger arm to move involved arm straight up. Then slowly raise back.  Keep thumb up. Hold 10 seconds. Repeat 10 times. Do 1-2 sessions per day.     Table Top Stretch (Static)    Sitting at table, slide arm across table using other arm to help if needed. Hold 5 seconds. Repeat 10 times then perform side-to-side, and in circles. Do 1-2 sessions per day.   Copyright  VHI. All rights reserved.  Elbow: Extension    Clasp hands and then lean over to stretch down/out elbow between legs  Hold 10 seconds. Repeat 10 times. Do 1-2 sessions per day. CAUTION: Stretch slowly and gently. Do not force joint.   Extension (Passive)    Using other hand, lift hand at wrist as far as possible. Hold 10 seconds. Repeat 10 times. Do 1-2 sessions per day.     FINGERS: Extension    Use opposite hand to straighten fingers. Do not bend knuckles backwards. Hold 10 seconds. 10 reps per set, 1-2 times per day    Supination (Passive)    Keep elbow bent at right angle and held firmly at side. Use other hand to turn forearm until palm faces upward. Hold 10 seconds. Repeat 10 times. Do 1-2 sessions per day.

## 2016-11-24 NOTE — Therapy (Signed)
[facility] [address] [place], [place], [address] Phone: [phone]   Fax:  [phone]  Occupational Therapy Treatment  Patient Details  Name: [name] MRN: [id number] Date of Birth: [birth year] Referring Provider: Dr. Alysia Penna  Encounter Date: 11/24/2016      OT End of Session - 11/24/16 1326    Visit Number 5   Number of Visits 9   Date for OT Re-Evaluation 12/06/16   Authorization Type MCR - G code needed   Authorization - Visit Number 5   Authorization - Number of Visits 10   OT Start Time 1324   OT Stop Time 1402   OT Time Calculation (min) 38 min   Activity Tolerance Patient tolerated treatment well   Behavior During Therapy [facility] for tasks assessed/performed      Past Medical History:  Diagnosis Date  . 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 CVA (cerebral infarction) 01/03/2008   MRI: Acute 1 x 1.5 cm infarction affecting the left side of the pons.  . Diabetes mellitus   . DVT (deep venous thrombosis) ([place])   . Hypertension   . PAD (peripheral artery disease) ([place])   . Stroke [facility])    2008    Past Surgical History:  Procedure Laterality Date  . COLONOSCOPY    . None      There were no vitals filed for this visit.      Subjective Assessment - 11/24/16 1326    Subjective  Pt reports splint is working well   Pertinent History spastic hemiplegia Rt side from CVA 2009, recent Lt ACA infarct 06/29/16 which pt reports affected speech more, recent botox injections 10/14/16 RUE: biceps, FCR, FDS, PT. PMH: DM, HTN   Limitations Rt AFO   Patient Stated Goals Get more use in my Rt arm   Currently in Pain? No/denies         Attempted RUE functional use activities:  Simulated washing LUE (to elbow, uses brush above elbow), using as a stabilizer to fold paper, used as stabilizer to open bottles, attempted to use as gross assist to fold towel, but mod difficulty due to  proximal limitations, standing to grasp small cylinder object and move to release (low range). All performed with min cueing for positioning/technique.   In supine, AAROM BUEs with cane (RUE holding onto R handle for better positioning), min cues/facilitation provided.                     OT Education - 11/24/16 1419    Education Details HEP--see pt instructions   Person(s) Educated Patient   Methods Explanation;Demonstration;Verbal cues;Handout   Comprehension Verbalized understanding;Returned demonstration;Verbal cues required  min cueing to slow down and for positioning             OT Long Term Goals - 11/24/16 1351      OT LONG TERM GOAL #1   Title Independent with HEP (LTG's due 12/06/16)    Time 4   Period Weeks   Status On-going     OT LONG TERM GOAL #2   Title Independent with splint wear and care    Time 4   Period Weeks   Status Achieved     OT LONG TERM GOAL #3   Title Pt to verbalize understanding with use and acquisition of A/E to increase ease and safety with cooking tasks (one handed can opener, cutting board, pot holder, rocker knife, etc)  Time 4   Period Weeks   Status Achieved     OT LONG TERM GOAL #4   Title Pt to use RUE as stabalizer 50% of the time for bilateral tasks   Time 4   Period Weeks   Status New               Plan - 11/24/16 1409    Clinical Impression Statement Pt is progressing towards LTG#1 with understading of HEP.   Rehab Potential Fair   Current Impairments/barriers affecting progress: time since initial onset of symptoms   OT Frequency 2x / week   OT Duration 4 weeks   OT Treatment/Interventions Moist Heat;DME and/or AE instruction;Splinting;Patient/family education;Therapeutic exercises;Therapeutic activities;Neuromuscular education;Passive range of motion;Manual Therapy;Electrical Stimulation   Plan review HEP, RUE functional use   Consulted and Agree with Plan of Care Patient      Patient will  benefit from skilled therapeutic intervention in order to improve the following deficits and impairments:  Decreased range of motion, Impaired flexibility, Impaired tone, Impaired UE functional use, Pain, Decreased knowledge of use of DME, Impaired sensation, Decreased coordination  Visit Diagnosis: Spastic hemiplegia of right dominant side as late effect of cerebral infarction (HCC)  Muscle weakness (generalized)  Unsteadiness on feet    Problem List Patient Active Problem List   Diagnosis Date Noted  . Dysarthria as late effect of stroke 08/18/2016  . Stage 2 chronic kidney disease   . Leukocytosis   . Benign essential HTN   . Spastic hemiparesis ([place])   . Aphasia due to recent cerebral infarction 07/04/2016  . Cerebrovascular accident (CVA) ([place])   . Altered mental status 06/30/2016  . Fall 06/30/2016  . Acute encephalopathy 06/30/2016  . Sepsis ([place]) 06/30/2016  . Diarrhea 06/30/2016  . Acute renal failure superimposed on stage 2 chronic kidney disease ([place]) 06/30/2016  . Hemiparesis affecting right side as late effect of stroke ([place]) 10/23/2015  . PAD (peripheral artery disease) ([place]) 10/20/2011  . Hyperlipidemia 02/21/2010  . CEREBROVASCULAR ACCIDENT, HX OF 12/22/2008  . Diabetes mellitus, type II ([place]) 07/30/2006  . OBESITY, NOS 07/30/2006  . Essential hypertension, benign 07/30/2006    [facility] 11/24/2016, 2:19 PM  [facility] [address] [place], [place], [address] Phone: [phone]   Fax:  [phone]  Name: [name] MRN: [id number] Date of Birth: 05-Aug-1950   Vianne Bulls, OTR/L [facility] [address] [place], [place]  [address] [phone] phone [phone] 11/24/16 2:19 PM

## 2016-11-25 ENCOUNTER — Ambulatory Visit: Payer: Medicare Other | Admitting: Physical Medicine & Rehabilitation

## 2016-11-25 NOTE — Therapy (Signed)
[facility] [address] [place], [place], [address] Phone: [phone]   Fax:  [phone]  Physical Therapy Treatment  Patient Details  Name: [name] MRN: [id number] Date of Birth: [birth year] Referring Provider: Dr. Alysia Penna  Encounter Date: 11/24/2016      PT End of Session - 11/25/16 1549    Visit Number 3   Number of Visits 9   Date for PT Re-Evaluation 01/05/17   Authorization Type Medicare Traditional primary-GCODE every 10th visit   PT Start Time 1406   PT Stop Time 1446   PT Time Calculation (min) 40 min   Equipment Utilized During Treatment Gait belt   Activity Tolerance Patient tolerated treatment well   Behavior During Therapy [name] for tasks assessed/performed      Past Medical History:  Diagnosis Date  . 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 CVA (cerebral infarction) 01/03/2008   MRI: Acute 1 x 1.5 cm infarction affecting the left side of the pons.  . Diabetes mellitus   . DVT (deep venous thrombosis) ([place])   . Hypertension   . PAD (peripheral artery disease) ([place])   . Stroke [facility])    2008    Past Surgical History:  Procedure Laterality Date  . COLONOSCOPY    . None      There were no vitals filed for this visit.      Subjective Assessment - 11/24/16 1409    Subjective Nothing new since last visit.   Pertinent History CVA 2009, DM, DVT, HTN, PAD, L ACA infarct 06/2016   Patient Stated Goals Pt's goal for therapy is to be able to walk faster.   Currently in Pain? No/denies                         [name] Adult PT Treatment/Exercise - 11/25/16 0001      Ambulation/Gait   Ambulation/Gait Yes   Ambulation/Gait Assistance 6: Modified independent (Device/Increase time);5: Supervision   Ambulation/Gait Assistance Details Cues provided for full placement of cane on ground and to avoid moving cane too far forward   Ambulation Distance (Feet) 80  Feet  110 ft, then 20 ft x 2   Assistive device Large base quad cane   Gait Pattern Step-through pattern;Decreased arm swing - right;Decreased step length - right;Decreased stance time - right;Decreased dorsiflexion - right;Decreased weight shift to right   Ambulation Surface Level;Indoor     High Level Balance   High Level Balance Activities Backward walking  Forward/back walking in parallel bars x 5 reps   High Level Balance Comments Reviewed HEP given last visit:  pt return demo understanding of sidestepping at counter, hip abduction x 10 reps each side, hip extension x 10 reps each side, marching x 10 reps, squats at counter x 10 reps.  Additionally, pt performs alternating sides for 10 reps each hip abduction and hip extension-to address more of balance.  Cues provided for upright posture.             Balance Exercises - 11/25/16 1548      Balance Exercises: Standing   Tandem Stance Eyes open;Upper extremity support 1;3 reps;10 secs   Other Standing Exercises With backwards walking in parallel bars, pt needs assist to widen BOS                 PT Long Term Goals - 11/06/16 1428      PT LONG TERM GOAL #  1   Title Pt will be independent with HEP for improved balance, strength, and gait.  TARGET 12/05/16   Time 4   Period Weeks   Status New     PT LONG TERM GOAL #2   Title Pt will improve Berg Balance score to at least 41/56 for decreased fall risk.   Time 4   Period Weeks   Status New     PT LONG TERM GOAL #3   Title Pt will improve TUG score to less than or equal to 30 seconds for decreased fall risk.   Time 4   Period Weeks   Status New     PT LONG TERM GOAL #4   Title Pt will improve gait velocity to at least 1.6 ft/sec for improved gait efficiency and safety.   Time 4   Period Weeks   Status New     PT LONG TERM GOAL #5   Title Pt will verbalize plans for continued community fitness upon D/C from PT.   Time 4   Period Weeks   Status New      Additional Long Term Goals   Additional Long Term Goals Yes     PT LONG TERM GOAL #6   Title Pt will verbalize understanding of fall prevention in the home environment.     Time 4   Period Weeks   Status New               Plan - 11/25/16 1550    Clinical Impression Statement Reviewed HEP today, with pt demo good understanding of HEP provided last visit.  Pt does need cues for upright posture and pt needs cues for full cane placement on ground, throughout session.  Pt will continue to benefit from skilled PT to address strength, balance and gait.   Rehab Potential Good   PT Frequency 2x / week   PT Duration 4 weeks  plus eval   PT Treatment/Interventions ADLs/Self Care Home Management;Functional mobility training;Gait training;Therapeutic activities;Therapeutic exercise;Balance training;Neuromuscular re-education;Patient/family education;Orthotic Fit/Training   PT Next Visit Plan Continue work on gait training, upright posture and standing activities-weightshifting, decrease UE support-   Consulted and Agree with Plan of Care Patient      Patient will benefit from skilled therapeutic intervention in order to improve the following deficits and impairments:  Abnormal gait, Decreased balance, Decreased mobility, Difficulty walking, Decreased strength, Postural dysfunction, Impaired tone  Visit Diagnosis: Muscle weakness (generalized)  Unsteadiness on feet  Other abnormalities of gait and mobility     Problem List Patient Active Problem List   Diagnosis Date Noted  . Dysarthria as late effect of stroke 08/18/2016  . Stage 2 chronic kidney disease   . Leukocytosis   . Benign essential HTN   . Spastic hemiparesis ([place])   . Aphasia due to recent cerebral infarction 07/04/2016  . Cerebrovascular accident (CVA) ([place])   . Altered mental status 06/30/2016  . Fall 06/30/2016  . Acute encephalopathy 06/30/2016  . Sepsis ([place]) 06/30/2016  . Diarrhea 06/30/2016  . Acute renal  failure superimposed on stage 2 chronic kidney disease ([place]) 06/30/2016  . Hemiparesis affecting right side as late effect of stroke ([place]) 10/23/2015  . PAD (peripheral artery disease) ([place]) 10/20/2011  . Hyperlipidemia 02/21/2010  . CEREBROVASCULAR ACCIDENT, HX OF 12/22/2008  . Diabetes mellitus, type II ([place]) 07/30/2006  . OBESITY, NOS 07/30/2006  . Essential hypertension, benign 07/30/2006    Corrine Tillis W. 11/25/2016, 4:19 PM  Frazier Butt., PT   [facility] [address] [place], [place], [address] Phone: [phone]   Fax:  [phone]  Name: [name] MRN: [id number] Date of Birth: 06/19/1950

## 2016-11-26 ENCOUNTER — Ambulatory Visit: Payer: Medicare Other | Admitting: Occupational Therapy

## 2016-11-26 ENCOUNTER — Encounter: Payer: Self-pay | Admitting: Physical Therapy

## 2016-11-26 ENCOUNTER — Ambulatory Visit: Payer: Medicare Other | Admitting: Physical Therapy

## 2016-11-26 DIAGNOSIS — R2681 Unsteadiness on feet: Secondary | ICD-10-CM | POA: Diagnosis not present

## 2016-11-26 DIAGNOSIS — R2689 Other abnormalities of gait and mobility: Secondary | ICD-10-CM

## 2016-11-26 DIAGNOSIS — I69351 Hemiplegia and hemiparesis following cerebral infarction affecting right dominant side: Secondary | ICD-10-CM | POA: Diagnosis not present

## 2016-11-26 DIAGNOSIS — M6281 Muscle weakness (generalized): Secondary | ICD-10-CM | POA: Diagnosis not present

## 2016-11-26 DIAGNOSIS — I679 Cerebrovascular disease, unspecified: Secondary | ICD-10-CM | POA: Diagnosis not present

## 2016-11-27 NOTE — Therapy (Signed)
[facility] [address] [place], [place], [address] Phone: [phone]   Fax:  [phone]  Physical Therapy Treatment  Patient Details  Name: [name] MRN: [id number] Date of Birth: 06/07/1950 Referring Provider: Dr. Alysia Penna  Encounter Date: 11/26/2016      PT End of Session - 11/26/16 0853    Visit Number 4   Number of Visits 9   Date for PT Re-Evaluation 01/05/17   Authorization Type Medicare Traditional primary-GCODE every 10th visit   PT Start Time 0851  pt in bathroom before session   PT Stop Time 0930   PT Time Calculation (min) 39 min   Equipment Utilized During Treatment Gait belt   Activity Tolerance Patient tolerated treatment well   Behavior During Therapy [facility] for tasks assessed/performed      Past Medical History:  Diagnosis Date  . 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 CVA (cerebral infarction) 01/03/2008   MRI: Acute 1 x 1.5 cm infarction affecting the left side of the pons.  . Diabetes mellitus   . DVT (deep venous thrombosis) ([place])   . Hypertension   . PAD (peripheral artery disease) ([place])   . Stroke [facility])    2008    Past Surgical History:  Procedure Laterality Date  . COLONOSCOPY    . None      There were no vitals filed for this visit.      Subjective Assessment - 11/26/16 0852    Subjective No new complaints. No falls to report. No pain to report.    Pertinent History CVA 2009, DM, DVT, HTN, PAD, L ACA infarct 06/2016   Patient Stated Goals Pt's goal for therapy is to be able to walk faster.   Currently in Pain? No/denies             [facility] Adult PT Treatment/Exercise - 11/26/16 0856      Transfers   Transfers Sit to Stand;Stand to Sit   Sit to Stand 6: Modified independent (Device/Increase time);With upper extremity assist;From chair/3-in-1   Stand to Sit 6: Modified independent (Device/Increase time);With upper extremity assist;To  chair/3-in-1     Ambulation/Gait   Ambulation/Gait Yes   Ambulation/Gait Assistance 6: Modified independent (Device/Increase time);5: Supervision;4: Min assist   Ambulation/Gait Assistance Details Gait with various canes trialed today as pt tends to only use one point of his large based quad cane. supervision needed with use of small based quad cane and up to min assist for balance with use of straight cane with rubber quad tip. Pt also needs cues to increase bil step lenght, right > left side and for increased weight shifting onto right leg in stance.     Ambulation Distance (Feet) 115 Feet  x3 reps   Assistive device Large base quad cane;Small based quad cane;Straight cane  straight cane with rubber quad tip   Gait Pattern Step-through pattern;Decreased arm swing - right;Decreased step length - right;Decreased stance time - right;Decreased dorsiflexion - right;Decreased weight shift to right   Ambulation Surface Level;Indoor   Pre-Gait Activities use of ladder with flat white slats: working on reciprocal stepping with one foot to a square x 6 laps forward with small based quad cane. min<>mod assist needed for balance with cues on sequencing.      Neuro Re-ed    Neuro Re-ed Details  gait with small based quad cane over simulated outdoor surfaces in gym: red mat<>floor<>blue mat<>floor<>red mat x 2 laps with up to  min assist.              PT Long Term Goals - 11/06/16 1428      PT LONG TERM GOAL #1   Title Pt will be independent with HEP for improved balance, strength, and gait.  TARGET 12/05/16   Time 4   Period Weeks   Status New     PT LONG TERM GOAL #2   Title Pt will improve Berg Balance score to at least 41/56 for decreased fall risk.   Time 4   Period Weeks   Status New     PT LONG TERM GOAL #3   Title Pt will improve TUG score to less than or equal to 30 seconds for decreased fall risk.   Time 4   Period Weeks   Status New     PT LONG TERM GOAL #4   Title Pt will  improve gait velocity to at least 1.6 ft/sec for improved gait efficiency and safety.   Time 4   Period Weeks   Status New     PT LONG TERM GOAL #5   Title Pt will verbalize plans for continued community fitness upon D/C from PT.   Time 4   Period Weeks   Status New     Additional Long Term Goals   Additional Long Term Goals Yes     PT LONG TERM GOAL #6   Title Pt will verbalize understanding of fall prevention in the home environment.     Time 4   Period Weeks   Status New               Plan - 11/26/16 9562    Clinical Impression Statement Today's skilled session address gait with emphasis on increased step length for increased gait speed, trial of other canes with small based quad cane appearing to be a better option. Pt keeps all 4 feet of small based quad cane on ground vs a single foot as weight the large based quad cane. with the straight cane with rubber tip pt needed assistance for balance. Pt needs continued work on gait and balance and should benefit from continued PT to progress toward unmet goals.    Rehab Potential Good   PT Frequency 2x / week   PT Duration 4 weeks  plus eval   PT Treatment/Interventions ADLs/Self Care Home Management;Functional mobility training;Gait training;Therapeutic activities;Therapeutic exercise;Balance training;Neuromuscular re-education;Patient/family education;Orthotic Fit/Training   PT Next Visit Plan Continue work on gait training, upright posture and standing activities-weightshifting, decrease UE support. ? gait trainer program with body weight support system for improved gait speed/increased step length   Consulted and Agree with Plan of Care Patient      Patient will benefit from skilled therapeutic intervention in order to improve the following deficits and impairments:  Abnormal gait, Decreased balance, Decreased mobility, Difficulty walking, Decreased strength, Postural dysfunction, Impaired tone  Visit Diagnosis: Muscle  weakness (generalized)  Unsteadiness on feet  Other abnormalities of gait and mobility     Problem List Patient Active Problem List   Diagnosis Date Noted  . Dysarthria as late effect of stroke 08/18/2016  . Stage 2 chronic kidney disease   . Leukocytosis   . Benign essential HTN   . Spastic hemiparesis ([place])   . Aphasia due to recent cerebral infarction 07/04/2016  . Cerebrovascular accident (CVA) ([place])   . Altered mental status 06/30/2016  . Fall 06/30/2016  . Acute encephalopathy 06/30/2016  . Sepsis ([place]) 06/30/2016  .  Diarrhea 06/30/2016  . Acute renal failure superimposed on stage 2 chronic kidney disease ([place]) 06/30/2016  . Hemiparesis affecting right side as late effect of stroke ([place]) 10/23/2015  . PAD (peripheral artery disease) ([place]) 10/20/2011  . Hyperlipidemia 02/21/2010  . CEREBROVASCULAR ACCIDENT, HX OF 12/22/2008  . Diabetes mellitus, type II ([place]) 07/30/2006  . OBESITY, NOS 07/30/2006  . Essential hypertension, benign 07/30/2006    [name], PTA, [facility] [address] [place], [place] [address] [phone] 11/27/16, 2:37 PM   Name: REINHOLD RICKEY MRN: [id number] Date of Birth: 09-Mar-1951

## 2016-12-01 ENCOUNTER — Ambulatory Visit: Payer: Medicare Other | Admitting: Physical Therapy

## 2016-12-01 ENCOUNTER — Ambulatory Visit: Payer: Medicare Other | Attending: Physical Medicine & Rehabilitation | Admitting: Occupational Therapy

## 2016-12-01 DIAGNOSIS — I69351 Hemiplegia and hemiparesis following cerebral infarction affecting right dominant side: Secondary | ICD-10-CM | POA: Insufficient documentation

## 2016-12-01 DIAGNOSIS — R2681 Unsteadiness on feet: Secondary | ICD-10-CM | POA: Diagnosis not present

## 2016-12-01 DIAGNOSIS — R2689 Other abnormalities of gait and mobility: Secondary | ICD-10-CM | POA: Diagnosis not present

## 2016-12-01 DIAGNOSIS — M6281 Muscle weakness (generalized): Secondary | ICD-10-CM | POA: Insufficient documentation

## 2016-12-01 NOTE — Therapy (Signed)
[facility] [address] [place], [place], [address] Phone: [phone]   Fax:  [phone]  Physical Therapy Treatment  Patient Details  Name: [name] MRN: [id number] Date of Birth: [birth year] Referring Provider: Dr. Alysia Penna  Encounter Date: 12/01/2016      PT End of Session - 12/01/16 1437    Visit Number 5   Number of Visits 9   Date for PT Re-Evaluation 01/05/17   Authorization Type Medicare Traditional primary-GCODE every 10th visit   PT Start Time 1315  in bathroom for ~7 minutes   PT Stop Time 1400   PT Time Calculation (min) 45 min   Equipment Utilized During Treatment Gait belt   Activity Tolerance Patient tolerated treatment well   Behavior During Therapy [facility] for tasks assessed/performed      Past Medical History:  Diagnosis Date  . 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 CVA (cerebral infarction) 01/03/2008   MRI: Acute 1 x 1.5 cm infarction affecting the left side of the pons.  . Diabetes mellitus   . DVT (deep venous thrombosis) ([place])   . Hypertension   . PAD (peripheral artery disease) ([place])   . Stroke [facility])    2008    Past Surgical History:  Procedure Laterality Date  . COLONOSCOPY    . None      There were no vitals filed for this visit.      Subjective Assessment - 12/01/16 1447    Subjective No new complaints. No falls or pain to report. Patient said he tried to stay indoors this weekend due to the heat.         [place] Adult PT Treatment/Exercise - 12/01/16 1409      Transfers   Transfers Sit to Stand   Sit to Stand 6: Modified independent (Device/Increase time)   Stand to Sit 6: Modified independent (Device/Increase time)     Ambulation/Gait   Ambulation/Gait Yes   Ambulation/Gait Assistance 6: Modified independent (Device/Increase time)   Ambulation/Gait Assistance Details VC increase step length bilaterally, R>L   Ambulation Distance (Feet)  215 Feet  115, 50, 50 feet   Assistive device Small based quad cane;Large base quad cane   Gait Pattern Step-through pattern;Decreased weight shift to right;Decreased dorsiflexion - right;Decreased step length - right;Decreased stance time - right   Ambulation Surface Level;Indoor   Pre-Gait Activities Use of ladder with white slats to focus on reciprocal stepping, 1 foot to a square, x8 laps forward with small quad cane, VC for sequencing, increase step length, weight shifting, min to no assist.            Balance Exercises - 12/01/16 1425      Balance Exercises: Standing   Standing Eyes Opened --   Rockerboard EO;20 seconds;Intermittent UE support;Head turns;Other (comment);Other reps (comment);Anterior/posterior;Lateral  block practice static balance; head turns R<>L x8   Overall Comments --     Balance Exercises: Standing   Standing Eyes Opened Limitations --   Rebounder Limitations Min to mod assist for balance, consistent VC for weight shifting; greater use of UE support to maintain lateral vs anterior/posterior balance. progressed from EO no head movements to EO head movements as balance improved, increased assistance needed with this.            PT Long Term Goals - 11/06/16 1428      PT LONG TERM GOAL #1   Title Pt will be independent with HEP for improved balance,  strength, and gait.  TARGET 12/05/16   Time 4   Period Weeks   Status New     PT LONG TERM GOAL #2   Title Pt will improve Berg Balance score to at least 41/56 for decreased fall risk.   Time 4   Period Weeks   Status New     PT LONG TERM GOAL #3   Title Pt will improve TUG score to less than or equal to 30 seconds for decreased fall risk.   Time 4   Period Weeks   Status New     PT LONG TERM GOAL #4   Title Pt will improve gait velocity to at least 1.6 ft/sec for improved gait efficiency and safety.   Time 4   Period Weeks   Status New     PT LONG TERM GOAL #5   Title Pt will verbalize plans  for continued community fitness upon D/C from PT.   Time 4   Period Weeks   Status New     Additional Long Term Goals   Additional Long Term Goals Yes     PT LONG TERM GOAL #6   Title Pt will verbalize understanding of fall prevention in the home environment.     Time 4   Period Weeks   Status New         Plan - 12/01/16 1551    Clinical Impression Statement Today's skilled session focused on carryover of increased step length walking with small quad cane and on standing balance. Pt demonstrated improved independence with increased step length on right, requiring fewer cues to improve step length. Noted improved standing balance on rockerboard with EO with block practice, requiring less UE support as trials progressed. VC for weight shifting were effective increasing static balance on rockerboard. Patient is steadily progressing towards LTG's and will benefit from continued PT to meet unmet goals   Rehab Potential Good   PT Frequency 2x / week   PT Duration 4 weeks   PT Treatment/Interventions ADLs/Self Care Home Management;Functional mobility training;Gait training;Therapeutic activities;Therapeutic exercise;Balance training;Neuromuscular re-education;Patient/family education;Orthotic Fit/Training   PT Next Visit Plan check LTGs   Consulted and Agree with Plan of Care Patient       Patient will benefit from skilled therapeutic intervention in order to improve the following deficits and impairments:  Abnormal gait, Decreased balance, Decreased mobility, Difficulty walking, Decreased strength, Postural dysfunction, Impaired tone  Visit Diagnosis: Muscle weakness (generalized)  Unsteadiness on feet  Other abnormalities of gait and mobility     Problem List Patient Active Problem List   Diagnosis Date Noted  . Dysarthria as late effect of stroke 08/18/2016  . Stage 2 chronic kidney disease   . Leukocytosis   . Benign essential HTN   . Spastic hemiparesis ([place])   .  Aphasia due to recent cerebral infarction 07/04/2016  . Cerebrovascular accident (CVA) ([place])   . Altered mental status 06/30/2016  . Fall 06/30/2016  . Acute encephalopathy 06/30/2016  . Sepsis ([place]) 06/30/2016  . Diarrhea 06/30/2016  . Acute renal failure superimposed on stage 2 chronic kidney disease ([place]) 06/30/2016  . Hemiparesis affecting right side as late effect of stroke ([place]) 10/23/2015  . PAD (peripheral artery disease) ([place]) 10/20/2011  . Hyperlipidemia 02/21/2010  . CEREBROVASCULAR ACCIDENT, HX OF 12/22/2008  . Diabetes mellitus, type II ([place]) 07/30/2006  . OBESITY, NOS 07/30/2006  . Essential hypertension, benign 07/30/2006    Rulon Eisenmenger, SPTA  12/02/2016, 12:55 PM  [facility]  [facility] [place] [address] [place], [place], [address] Phone: [phone]   Fax:  [phone]  Name: [name] MRN: [id number] Date of Birth: 19-Jun-1950  This note has been reviewed and edited by supervising CI.  [name], PTA, [facility] [address] [place], [place] [address] [phone] 12/02/16, 3:20 PM

## 2016-12-01 NOTE — Therapy (Signed)
[facility] [address] [place], [place], [address] Phone: [phone]   Fax:  [phone]  Occupational Therapy Treatment  Patient Details  Name: [name] MRN: [id number] Date of Birth: 24-Dec-1950 Referring Provider: Dr. Alysia Penna  Encounter Date: 12/01/2016      OT End of Session - 12/01/16 1351    Visit Number 6   Number of Visits 9   Date for OT Re-Evaluation 12/06/16   Authorization Type MCR - G code needed   Authorization - Visit Number 6   Authorization - Number of Visits 10   OT Start Time 8242   OT Stop Time 1445   OT Time Calculation (min) 38 min   Activity Tolerance Patient tolerated treatment well   Behavior During Therapy [facility] for tasks assessed/performed      Past Medical History:  Diagnosis Date  . 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 CVA (cerebral infarction) 01/03/2008   MRI: Acute 1 x 1.5 cm infarction affecting the left side of the pons.  . Diabetes mellitus   . DVT (deep venous thrombosis) ([place])   . Hypertension   . PAD (peripheral artery disease) ([place])   . Stroke [facility])    2008    Past Surgical History:  Procedure Laterality Date  . COLONOSCOPY    . None      There were no vitals filed for this visit.      Subjective Assessment - 12/01/16 1351    Subjective  Pt reports splint is working well   Pertinent History spastic hemiplegia Rt side from CVA 2009, recent Lt ACA infarct 06/29/16 which pt reports affected speech more, recent botox injections 10/14/16 RUE: biceps, FCR, FDS, PT. PMH: DM, HTN   Limitations Rt AFO   Patient Stated Goals Get more use in my Rt arm   Currently in Pain? No/denies      RUE functional use:  Folding towel (1 fold), pulling open drawer (edge), attempted to open cabinet (but needed assist), picking up 1-inch blocks and putting in container in standing, grasp/release of cylinder objects with min A, opened lever door.                          OT Education - 12/01/16 1720    Education Details Reviewed HEP   Person(s) Educated Patient   Methods Explanation;Demonstration;Verbal cues   Comprehension Verbalized understanding;Returned demonstration;Verbal cues required  min v.c.             OT Long Term Goals - 12/01/16 1414      OT LONG TERM GOAL #1   Title Independent with HEP (LTG's due 12/06/16)    Time 4   Period Weeks   Status On-going     OT LONG TERM GOAL #2   Title Independent with splint wear and care    Time 4   Period Weeks   Status Achieved     OT LONG TERM GOAL #3   Title Pt to verbalize understanding with use and acquisition of A/E to increase ease and safety with cooking tasks (one handed can opener, cutting board, pot holder, rocker knife, etc)    Time 4   Period Weeks   Status Achieved     OT LONG TERM GOAL #4   Title Pt to use RUE as stabalizer 50% of the time for bilateral tasks   Time 4   Period Weeks   Status New  Plan - 12/01/16 1414    Clinical Impression Statement Pt is progressing well with return demo of HEP.  Pt continues to demo difficulty incr use of RUE functionally due to decr finger ext.   Rehab Potential Fair   Current Impairments/barriers affecting progress: time since initial onset of symptoms   OT Frequency 2x / week   OT Duration 4 weeks   OT Treatment/Interventions Moist Heat;DME and/or AE instruction;Splinting;Patient/family education;Therapeutic exercises;Therapeutic activities;Neuromuscular education;Passive range of motion;Manual Therapy;Electrical Stimulation   Plan anticipate d/c next session (pt agrees); continue with RUE functional use   Consulted and Agree with Plan of Care Patient      Patient will benefit from skilled therapeutic intervention in order to improve the following deficits and impairments:  Decreased range of motion, Impaired flexibility, Impaired tone, Impaired UE functional use,  Pain, Decreased knowledge of use of DME, Impaired sensation, Decreased coordination  Visit Diagnosis: Spastic hemiplegia of right dominant side as late effect of cerebral infarction ([place])  Unsteadiness on feet    Problem List Patient Active Problem List   Diagnosis Date Noted  . Dysarthria as late effect of stroke 08/18/2016  . Stage 2 chronic kidney disease   . Leukocytosis   . Benign essential HTN   . Spastic hemiparesis ([place])   . Aphasia due to recent cerebral infarction 07/04/2016  . Cerebrovascular accident (CVA) ([place])   . Altered mental status 06/30/2016  . Fall 06/30/2016  . Acute encephalopathy 06/30/2016  . Sepsis ([place]) 06/30/2016  . Diarrhea 06/30/2016  . Acute renal failure superimposed on stage 2 chronic kidney disease ([place]) 06/30/2016  . Hemiparesis affecting right side as late effect of stroke ([place]) 10/23/2015  . PAD (peripheral artery disease) ([place]) 10/20/2011  . Hyperlipidemia 02/21/2010  . CEREBROVASCULAR ACCIDENT, HX OF 12/22/2008  . Diabetes mellitus, type II ([place]) 07/30/2006  . OBESITY, NOS 07/30/2006  . Essential hypertension, benign 07/30/2006    [facility] 12/01/2016, 5:21 PM  [facility] [address] [place], [place], [address] Phone: [phone]   Fax:  [phone]  Name: [name] MRN: [id number] Date of Birth: 1951/05/23   Vianne Bulls, OTR/L [facility] [address] [place], [place]  [address] [phone] phone [phone] 12/01/16 5:21 PM

## 2016-12-02 ENCOUNTER — Ambulatory Visit (INDEPENDENT_AMBULATORY_CARE_PROVIDER_SITE_OTHER): Payer: Medicare Other | Admitting: Nurse Practitioner

## 2016-12-02 ENCOUNTER — Encounter: Payer: Self-pay | Admitting: Nurse Practitioner

## 2016-12-02 VITALS — BP 89/52 | HR 69 | Wt 234.8 lb

## 2016-12-02 DIAGNOSIS — G8110 Spastic hemiplegia affecting unspecified side: Secondary | ICD-10-CM

## 2016-12-02 DIAGNOSIS — I639 Cerebral infarction, unspecified: Secondary | ICD-10-CM | POA: Diagnosis not present

## 2016-12-02 DIAGNOSIS — E782 Mixed hyperlipidemia: Secondary | ICD-10-CM

## 2016-12-02 DIAGNOSIS — I63522 Cerebral infarction due to unspecified occlusion or stenosis of left anterior cerebral artery: Secondary | ICD-10-CM | POA: Diagnosis not present

## 2016-12-02 DIAGNOSIS — I10 Essential (primary) hypertension: Secondary | ICD-10-CM

## 2016-12-02 MED ORDER — CLOPIDOGREL BISULFATE 75 MG PO TABS: ORAL_TABLET | ORAL | 6 refills | Status: DC

## 2016-12-02 NOTE — Progress Notes (Signed)
[facility]  PATIENT: [name] DOB: Apr 12, 1951   REASON FOR VISIT:  follow-up for stroke  HISTORY from patient   HISTORY OF PRESENT ILLNESS: UPDATE 12/02/16 CM [name], 66 year old male returns for follow-up with history of stroke 07/01/2016. He had a previous stroke in 2009. He is currently on Plavix and aspirin for secondary stroke prevention. He has not had further stroke or TIA symptoms He was asked to stop his aspirin after 3 months at his last visit however he has continued with aspirin as well. Blood pressure in the office today 89/52. He claims he does not drink much water. He is continuing to get occupational therapy and physical therapy outpatient. He ambulates with a quad cane. He denies any falls. He has had problems controlling his blood sugars had some medication changes to his diabetic regimen. Most recent hemoglobin A1c 7.4 in April 4,2018. He remains on Lipitor without myalgias. He is currently living alone. He says he is independent in activities of daily living. He does his cooking. He denies any discomfort He returns for reevaluation   HISTORY: 09/02/16 CM [name], 66 year old male was admitted to the hospital in January presenting with lethargy for 3 days and multiple falls is a history of hypertension diabetes and previous stroke on the right with right hemiparesis. MRI left ACA infarct. CTA head and neck right greater than left atherosclerotic disease 2-D echo 55-60% EF. LDL 60. Hemoglobin A1c 6.9 he returns to the clinic for follow-up. He lives alone he remains on Plavix and aspirin for 3 months then will go to Plavix alone. Blood pressure in the office today 133/70. He is on Lipitor for hyperlipidemia without complaints of myalgias. He is also insulin-dependent diabetic and claims his blood sugars are better controlled. He continues to get occupational therapy and speech therapy in the home. His previous stroke was in 2009. He returns for  reevaluation   REVIEW OF SYSTEMS: Full 14 system review of systems performed and notable only for those listed, all others are neg:  Constitutional: neg  Cardiovascular: neg Ear/Nose/Throat: neg  Skin: neg Eyes: neg Respiratory: neg Gastroitestinal: neg  Hematology/Lymphatic: neg  Endocrine: neg Musculoskeletal: Walking difficulty Allergy/Immunology: neg Neurological: neg Psychiatric: neg Sleep : neg   ALLERGIES: No Known Allergies  HOME MEDICATIONS: Outpatient Medications Prior to Visit  Medication Sig Dispense Refill  . amLODipine (NORVASC) 10 MG tablet Take 1 tablet (10 mg total) by mouth daily. 90 tablet 3  . aspirin EC 81 MG tablet Take 81 mg by mouth daily.    Marland Kitchen atorvastatin (LIPITOR) 40 MG tablet Take 1 tablet (40 mg total) by mouth daily. 90 tablet 3  . carvedilol (COREG) 25 MG tablet TAKE 1 TABLET BY MOUTH TWICE A DAY WITH A MEAL 120 tablet 3  . chlorthalidone (HYGROTON) 25 MG tablet Take 1 tablet (25 mg total) by mouth daily. 90 tablet 3  . clopidogrel (PLAVIX) 75 MG tablet TAKE 1 TABLET (75 MG TOTAL) BY MOUTH DAILY. 30 tablet 0  . insulin aspart (NOVOLOG FLEXPEN) 100 UNIT/ML FlexPen Inject 15 Units into the skin 3 (three) times daily with meals. 15 mL 11  . Insulin Detemir (LEVEMIR FLEXTOUCH) 100 UNIT/ML Pen Inject 27 Units into the skin 2 (two) times daily. 15 pen 5  . lisinopril (PRINIVIL,ZESTRIL) 40 MG tablet Take 1 tablet (40 mg total) by mouth daily. 90 tablet 3  . metFORMIN (GLUCOPHAGE) 1000 MG tablet Take 1 tablet (1,000 mg total) by mouth 2 (two) times daily  with a meal. 60 tablet 0  . sertraline (ZOLOFT) 100 MG tablet Take 1 tablet (100 mg total) by mouth daily. 30 tablet 1  . Blood Gluc Meter Disp-Strips (SIDEKICK BLOOD GLUCOSE SYSTEM) DEVI Use for daily testing (Patient not taking: Reported on 12/02/2016) 100 each 3  . Insulin Pen Needle (B-D ULTRAFINE III SHORT PEN) 31G X 8 MM MISC USE WITH LEVEMIR (Patient not taking: Reported on 12/02/2016) 100 each 0  .  Insulin Syringe-Needle U-100 28G X 1/2" 1 ML MISC 1 each by Does not apply route 3 (three) times daily. (Patient not taking: Reported on 12/02/2016) 100 each 11   No facility-administered medications prior to visit.     PAST MEDICAL HISTORY: Past Medical History:  Diagnosis Date  . 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 CVA (cerebral infarction) 01/03/2008   MRI: Acute 1 x 1.5 cm infarction affecting the left side of the pons.  . Diabetes mellitus   . DVT (deep venous thrombosis) ([place])   . Hypertension   . PAD (peripheral artery disease) ([place])   . Stroke [facility])    2008    PAST SURGICAL HISTORY: Past Surgical History:  Procedure Laterality Date  . COLONOSCOPY    . None      FAMILY HISTORY: Family History  Problem Relation Age of Onset  . Diabetes Mother   . Heart attack Mother   . Hypertension Mother   . Diabetes Sister   . Hypertension Sister   . Stroke Sister        2017  . Hypertension Brother   . Hypertension Daughter   . Hypertension Son   . Colon cancer Neg Hx   . Esophageal cancer Neg Hx   . Stomach cancer Neg Hx   . Rectal cancer Neg Hx     SOCIAL HISTORY: Social History   Social History  . Marital status: Divorced    Spouse name: N/A  . Number of children: 3  . Years of education: 14   Occupational History  . Disabled-security guard [employer] History Main Topics  . Smoking status: Never Smoker  . Smokeless tobacco: Never Used  . Alcohol use No  . Drug use: No  . Sexual activity: No   Other Topics Concern  . Not on file   Social History Narrative   Divorced, lives alone   2 sons, 1 daughter   Retired/disabled   No caffeine   12/24/2015        PHYSICAL EXAM  Vitals:   12/02/16 1333  BP: (!) 89/52  Pulse: 69  Weight: 234 lb 12.8 oz (106.5 kg)   Body mass index is 36.77 kg/m.  Generalized: Well developed, Obese male in no acute distress  Head: normocephalic and atraumatic,. Oropharynx benign  Neck:  Supple, no carotid bruits  Cardiac: Regular rate rhythm, no murmur  Musculoskeletal: No deformity   Neurological examination   Mentation: Alert oriented to time, place, history taking. Attention span and concentration appropriate. Recent and remote memory intact.  Follows all commands speech  with dysarthria .   Cranial nerve II-XII: Pupils were equal round reactive to light extraocular movements were full,  mild left ptosis visual field were full on confrontational test.  mild left facial droop  hearing was intact to finger rubbing bilaterally. Uvula tongue midline. head turning and shoulder shrug were normal and symmetric.Tongue protrusion into cheek strength was normal. Motor: normal bulk and tone, full strength in the BUE, BLE,on the  left, 0 out of 5 right upper extremity and right lower extremity. Has brace to right lower extremity  fine finger movements normal, no pronator drift. No focal weakness Sensory: normal and symmetric to light touch, pinprick, and  Vibration,in the upper and lower extremities  Coordination: finger-nose-finger, heel-to-shin bilaterally,normal on the left  Reflexes:1+ in the upper and lower extremities, plantar responses were flexor bilaterally. Gait and Station: Rising up from seated position with push off, ambulated short distance with a quad cane . Slow  gait  DIAGNOSTIC DATA (LABS, IMAGING, TESTING) - I reviewed patient records, labs, notes, testing and imaging myself where available.  Lab Results  Component Value Date   WBC 11.6 (H) 07/16/2016   HGB 12.5 (L) 07/16/2016   HCT 39.6 07/16/2016   MCV 92.5 07/16/2016   PLT 205 07/16/2016      Component Value Date/Time   NA 139 07/16/2016 0614   K 4.6 07/16/2016 0614   CL 103 07/16/2016 0614   CO2 30 07/16/2016 0614   GLUCOSE 103 (H) 07/16/2016 0614   BUN 15 07/16/2016 0614   CREATININE 1.07 07/16/2016 0614   CREATININE 1.02 11/05/2015 1203   CALCIUM 9.0 07/16/2016 0614   PROT 5.7 (L) 07/07/2016 0640     ALBUMIN 2.4 (L) 07/07/2016 0640   AST 36 07/07/2016 0640   ALT 46 07/07/2016 0640   ALKPHOS 50 07/07/2016 0640   BILITOT 0.9 07/07/2016 0640   GFRNONAA >60 07/16/2016 0614   GFRNONAA 77 11/05/2015 1203   GFRAA >60 07/16/2016 0614   GFRAA 89 11/05/2015 1203   Lab Results  Component Value Date   CHOL 91 07/04/2016   HDL 22 (L) 07/04/2016   LDLCALC 60 07/04/2016   LDLDIRECT 78 09/10/2011   TRIG 43 07/04/2016   CHOLHDL 4.1 07/04/2016   Lab Results  Component Value Date   HGBA1C 7.3 09/03/2016    ASSESSMENT AND PLAN  66 y.o. year old male  here for hospital follow-up for stroke with risk factors of hypertension diabetes and previous stroke peripheral vascular disease chronic kidney disease and hyperlipidemia The patient is a current patient of Dr. Erlinda Hong  who is out of the office today . This note is sent to the work in doctor.     PLAN: Stressed the importance of management of risk factors to prevent further stroke Continue  Plavix for secondary stroke prevention  Stop aspirin Maintain strict control of hypertension with blood pressure goal below 130/90, today's reading 89/52 continue antihypertensive medications Stay well hydrated with water Control of diabetes with hemoglobin A1c below 6.5 followed by primary care most recent hemoglobin A1c7.3 continue diabetic medications Cholesterol with LDL cholesterol less than 70, followed by primary care,   continue Lipitor Continue PT and OT then home exercise program when discharged Patient is a fall risk, uses a quad cane at all times,and brace to RLE  eat healthy diet with whole grains,  fresh fruits and vegetables Follow up in 6 months Dennie Bible, [facility], [facility], [facility] [address] [place], [place] [address] [phone]

## 2016-12-02 NOTE — Progress Notes (Signed)
I have read the note, and I agree with the clinical assessment and plan.  Sereen Schaff KEITH   

## 2016-12-02 NOTE — Patient Instructions (Addendum)
Stressed the importance of management of risk factors to prevent further stroke Continue  Plavix for secondary stroke prevention  Stop aspirin Maintain strict control of hypertension with blood pressure goal below 130/90, today's reading 89/52 continue antihypertensive medications Stay well hydrated with water Control of diabetes with hemoglobin A1c below 6.5 followed by primary care most recent hemoglobin A1c7.3 continue diabetic medications Cholesterol with LDL cholesterol less than 70, followed by primary care,   continue Lipitor Continue PT and OT then home exercise program when discharged Patient is a fall risk, and his quad cane at all times  eat healthy diet with whole grains,  fresh fruits and vegetables Follow up in 6 months

## 2016-12-04 ENCOUNTER — Ambulatory Visit: Payer: Medicare Other | Admitting: Physical Therapy

## 2016-12-04 ENCOUNTER — Ambulatory Visit: Payer: Medicare Other | Admitting: Occupational Therapy

## 2016-12-04 DIAGNOSIS — I69351 Hemiplegia and hemiparesis following cerebral infarction affecting right dominant side: Secondary | ICD-10-CM | POA: Diagnosis not present

## 2016-12-04 DIAGNOSIS — M6281 Muscle weakness (generalized): Secondary | ICD-10-CM

## 2016-12-04 DIAGNOSIS — R2689 Other abnormalities of gait and mobility: Secondary | ICD-10-CM | POA: Diagnosis not present

## 2016-12-04 DIAGNOSIS — R2681 Unsteadiness on feet: Secondary | ICD-10-CM | POA: Diagnosis not present

## 2016-12-04 NOTE — Therapy (Signed)
[facility] [address] [place], [place], [address] Phone: [phone]   Fax:  [phone]  Physical Therapy Treatment  Patient Details  Name: [name] MRN: [id number] Date of Birth: 03/24/1951 Referring Provider: Dr. Alysia Penna  Encounter Date: 12/04/2016      PT End of Session - 12/04/16 0947    Visit Number 6   Number of Visits 9   Date for PT Re-Evaluation 01/05/17   Authorization Type Medicare Traditional primary-GCODE every 10th visit   PT Start Time 0847   PT Stop Time 0930   PT Time Calculation (min) 43 min   Equipment Utilized During Treatment Gait belt   Activity Tolerance Patient tolerated treatment well   Behavior During Therapy [facility] for tasks assessed/performed      Past Medical History:  Diagnosis Date  . 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 CVA (cerebral infarction) 01/03/2008   MRI: Acute 1 x 1.5 cm infarction affecting the left side of the pons.  . Diabetes mellitus   . DVT (deep venous thrombosis) ([place])   . Hypertension   . PAD (peripheral artery disease) ([place])   . Stroke [facility])    2008    Past Surgical History:  Procedure Laterality Date  . COLONOSCOPY    . None      There were no vitals filed for this visit.      Subjective Assessment - 12/04/16 0849    Subjective No changes; went to doctor for follow up yesterday.     Pertinent History CVA 2009, DM, DVT, HTN, PAD, L ACA infarct 06/2016   Patient Stated Goals Pt's goal for therapy is to be able to walk faster.   Currently in Pain? No/denies                         [facility] Adult PT Treatment/Exercise - 12/04/16 0854      Transfers   Transfers Sit to Stand   Sit to Stand 6: Modified independent (Device/Increase time)   Stand to Sit 6: Modified independent (Device/Increase time)     Ambulation/Gait   Ambulation/Gait Yes   Ambulation/Gait Assistance 6: Modified independent  (Device/Increase time)   Ambulation/Gait Assistance Details Gait using both LBQC and SBQC today.  Pt requires cues for full placement of cane on ground.   Ambulation Distance (Feet) 150 Feet  x 2, 70 ft x 2   Assistive device Small based quad cane;Large base quad cane   Gait Pattern Step-through pattern;Decreased weight shift to right;Decreased dorsiflexion - right;Decreased step length - right;Decreased stance time - right   Ambulation Surface Level;Indoor   Gait velocity 23.15 sec = 1.42 ft/sec  LBQC; with SBQC 22.04 sec = 1.49 ft/sec   Pre-Gait Activities Use of floor ladder with white slats, to increase step length and improve reciprocal step pattern, 1 foot to a square, 4 reps, with min assist.     Standardized Balance Assessment   Standardized Balance Assessment Timed Up and Go Test     Timed Up and Go Test   TUG Normal TUG   Normal TUG (seconds) 28.72     High Level Balance   High Level Balance Activities Backward walking  Forward/back walking in parallel bars x 3 reps   High Level Balance Comments Marching forward, length of parallel bars 4 reps     Self-Care   Self-Care Other Self-Care Comments   Other Self-Care Comments  Discussed POC, including  likely full LTG check with plans for discharge next week; discussed transition to [facility], with PT planning to contact Dr. Letta Pate for script for [facility].  Discussed options for community fitness, and pt verbalizes that he belongs to [facility] and he would like to plan to go back there to use fitness room and gym for walking.     Neuro Re-ed    Neuro Re-ed Details  In parallel bars:  using rockerboard:  anterior/posterior direction for ankle strategy x 20, hip/ankle strategy x 20, then standing steady with head turns x 5, head nods x 5, then EC head steady x 10 seconds                PT Education - 12/04/16 0946    Education provided Yes   Education Details Discussion of POC, transition back to [facility] exercise  room after d/c from PT; discussed PT obtaining script for [facility] and f/u with [employer] for ?AFO adjustment   Person(s) Educated Patient   Methods Explanation   Comprehension Verbalized understanding    Discussed possible AFO adjustment or follow-up with [employer] due to foot slap noted today with gait wearing AFO-      PT Short Term Goals - 12/01/16 1504      PT SHORT TERM GOAL #1   Title The patient will return demo HEP with written cues.   Baseline Target date 12/02/2015   Time 4   Period Weeks   Status On-going     PT SHORT TERM GOAL #2   Title The patient will improve Berg from 27/56 to > or equal to 33/56 to demo dec'ing risk for falls.   Baseline Target date 12/02/2015   Time 4   Period Weeks   Status On-going     PT SHORT TERM GOAL #3   Title The patient will improve gait speed from 1.02 ft/sec to > or equal to 1.3 ft/sec to transition to "limited community ambulator"   Baseline Patient scores 1.04 ft/sec on 11/26/2015 with cues to move at a faster pace.   Time 4   Period Weeks   Status Partially Met     PT SHORT TERM GOAL #4   Title The patient will negotiate 4 steps with step to pattern and one handrail with supervision    Baseline Met on 11/26/2015   Time 4   Period Weeks   Status Achieved           PT Long Term Goals - 12/04/16 0850      PT LONG TERM GOAL #1   Title Pt will be independent with HEP for improved balance, strength, and gait.  TARGET 12/05/16-EXTENDED x 1 week to be assessed by 12/12/16-AWM (1 week remains in POC)   Time 4   Period Weeks   Status On-going     PT LONG TERM GOAL #2   Title Pt will improve Berg Balance score to at least 41/56 for decreased fall risk.   Time 4   Period Weeks   Status On-going     PT LONG TERM GOAL #3   Title Pt will improve TUG score to less than or equal to 30 seconds for decreased fall risk.   Baseline 28.75 sec 12/04/16   Time 4   Period Weeks   Status Achieved     PT LONG TERM GOAL #4   Title Pt will improve  gait velocity to at least 1.6 ft/sec for improved gait efficiency and safety.   Time  4   Period Weeks   Status On-going     PT LONG TERM GOAL #5   Title Pt will verbalize plans for continued community fitness upon D/C from PT.   Time 4   Period Weeks   Status On-going     PT LONG TERM GOAL #6   Title Pt will verbalize understanding of fall prevention in the home environment.     Time 4   Period Weeks   Status On-going               Plan - 12/04/16 4431    Clinical Impression Statement Continued to focus on step length with small based quad cane and standing balance activities.  With gait velocity measure, pt has improved gait velocity since eval, and has slightly increased velocity with SBQC.  Pt has met TUG LTG.  Pt appears to be safe with SBQC, and may benefit from use with SBQC at home (versus [facility], as he doesn't fully place [facility] on floor with gait).  Also, noted foot slap with gait today on RLE, and pt may benefit from f/u with orthotist for any adjustments to AFO.  Pt will continue to benefit from skilled PT to progress towards unment LTGs.   Rehab Potential Good   PT Frequency 2x / week   PT Duration 4 weeks   PT Treatment/Interventions ADLs/Self Care Home Management;Functional mobility training;Gait training;Therapeutic activities;Therapeutic exercise;Balance training;Neuromuscular re-education;Patient/family education;Orthotic Fit/Training   PT Next Visit Plan LTGs pushed out to be fully checked next week, as visits remain in POC (next week is week 4); gait training with [facility] and follow up to issue if Dr. Letta Pate sends script   Consulted and Agree with Plan of Care Patient      Patient will benefit from skilled therapeutic intervention in order to improve the following deficits and impairments:  Abnormal gait, Decreased balance, Decreased mobility, Difficulty walking, Decreased strength, Postural dysfunction, Impaired tone  Visit Diagnosis: Other abnormalities of  gait and mobility  Unsteadiness on feet     Problem List Patient Active Problem List   Diagnosis Date Noted  . Dysarthria as late effect of stroke 08/18/2016  . Stage 2 chronic kidney disease   . Leukocytosis   . Benign essential HTN   . Spastic hemiparesis ([place])   . Aphasia due to recent cerebral infarction 07/04/2016  . Cerebrovascular accident (CVA) ([place])   . Altered mental status 06/30/2016  . Fall 06/30/2016  . Acute encephalopathy 06/30/2016  . Sepsis ([place]) 06/30/2016  . Diarrhea 06/30/2016  . Acute renal failure superimposed on stage 2 chronic kidney disease ([place]) 06/30/2016  . Hemiparesis affecting right side as late effect of stroke ([place]) 10/23/2015  . PAD (peripheral artery disease) ([place]) 10/20/2011  . Hyperlipidemia 02/21/2010  . CEREBROVASCULAR ACCIDENT, HX OF 12/22/2008  . Diabetes mellitus, type II ([place]) 07/30/2006  . OBESITY, NOS 07/30/2006  . Essential hypertension, benign 07/30/2006    Frazier Butt. 12/04/2016, 9:54 AM  Frazier Butt., PT  [facility] [address] [place], [place], [address] Phone: [phone]   Fax:  [phone]  Name: [name] MRN: [id number] Date of Birth: 28-Apr-1951

## 2016-12-04 NOTE — Therapy (Signed)
[facility] [address] [place], [place], [address] Phone: [phone]   Fax:  [phone]  Occupational Therapy Treatment  Patient Details  Name: [name] MRN: [id number] Date of Birth: December 31, 1950 Referring Provider: Dr. Alysia Penna  Encounter Date: 12/04/2016      OT End of Session - 12/04/16 0853    Visit Number 7   Number of Visits 9   Date for OT Re-Evaluation 12/06/16   Authorization Type MCR - G code needed   Authorization - Visit Number 7   Authorization - Number of Visits 10   OT Start Time 1027  pt in restroom at beginning of session   OT Stop Time 1100   OT Time Calculation (min) 33 min   Activity Tolerance Patient tolerated treatment well   Behavior During Therapy [facility] for tasks assessed/performed      Past Medical History:  Diagnosis Date  . 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 CVA (cerebral infarction) 01/03/2008   MRI: Acute 1 x 1.5 cm infarction affecting the left side of the pons.  . Diabetes mellitus   . DVT (deep venous thrombosis) ([place])   . Hypertension   . PAD (peripheral artery disease) ([place])   . Stroke [facility])    2008    Past Surgical History:  Procedure Laterality Date  . COLONOSCOPY    . None      There were no vitals filed for this visit.      Subjective Assessment - 12/04/16 0853    Subjective  Pt reports no problems with splint, HEP doing well and is ready for OT d/c   Pertinent History spastic hemiplegia Rt side from CVA 2009, recent Lt ACA infarct 06/29/16 which pt reports affected speech more, recent botox injections 10/14/16 RUE: biceps, FCR, FDS, PT. PMH: DM, HTN   Limitations Rt AFO   Patient Stated Goals Get more use in my Rt arm   Currently in Pain? No/denies        Arm bike x 5 min level 1 for reciprocal movement with min cueing to go slow so that he can maintain grasp with RUE.  Picking up 1-inch blocks and small bottles to  place  in container with min-mod difficulty.  Improved after stretching.  Pt educated in ways to incr RUE functional use at home and educated in importance of stretching and attempting RUE functional use.  Pt verbalized understanding.  Reviewed from HEP:  Shoulder flex stretch in supine, AAROM on tilted cane, finger ext stretch, towel slides.  Pt returned demo each x10.    In supine, AAROM shoulder flex with cane with BUEs with min facilitation.                    OT Education - 12/04/16 1044    Education Details Appropriate community HEP (AHOY classes with hands clasped for UE activity, Nu-step with UEs, Arm bike; Tabletop UBE for home   Person(s) Educated Patient   Methods Explanation   Comprehension Verbalized understanding             OT Long Term Goals - 12/04/16 1031      OT LONG TERM GOAL #1   Title Independent with HEP (LTG's due 12/06/16)    Time 4   Period Weeks   Status Achieved  12/04/16     OT LONG TERM GOAL #2   Title Independent with splint wear and care    Time 4  Period Weeks   Status Achieved     OT LONG TERM GOAL #3   Title Pt to verbalize understanding with use and acquisition of A/E to increase ease and safety with cooking tasks (one handed can opener, cutting board, pot holder, rocker knife, etc)    Time 4   Period Weeks   Status Achieved     OT LONG TERM GOAL #4   Title Pt to use RUE as stabalizer 50% of the time for bilateral tasks   Time 4   Period Weeks   Status Not Met  12-13-16:  approx 25% per pt report               Plan - December 13, 2016 1884    Clinical Impression Statement Pt verbalized understanding of HEP, splint wear, and ways to use RUE more functionally.   Rehab Potential Fair   Current Impairments/barriers affecting progress: time since initial onset of symptoms   OT Frequency 2x / week   OT Duration 4 weeks   OT Treatment/Interventions Moist Heat;DME and/or AE instruction;Splinting;Patient/family  education;Therapeutic exercises;Therapeutic activities;Neuromuscular education;Passive range of motion;Manual Therapy;Electrical Stimulation   Plan d/c OT   Consulted and Agree with Plan of Care Patient      Patient will benefit from skilled therapeutic intervention in order to improve the following deficits and impairments:  Decreased range of motion, Impaired flexibility, Impaired tone, Impaired UE functional use, Pain, Decreased knowledge of use of DME, Impaired sensation, Decreased coordination  Visit Diagnosis: Spastic hemiplegia of right dominant side as late effect of cerebral infarction (HCC)  Muscle weakness (generalized)      G-Codes - 13-Dec-2016 1311    Functional Assessment Tool Used (Outpatient only) clinical judgement RUE   Functional Limitation Changing and maintaining body position   Changing and Maintaining Body Position Goal Status (Z6606) At least 20 percent but less than 40 percent impaired, limited or restricted   Changing and Maintaining Body Position Discharge Status (T0160) At least 20 percent but less than 40 percent impaired, limited or restricted      Problem List Patient Active Problem List   Diagnosis Date Noted  . Dysarthria as late effect of stroke 08/18/2016  . Stage 2 chronic kidney disease   . Leukocytosis   . Benign essential HTN   . Spastic hemiparesis ([place])   . Aphasia due to recent cerebral infarction 07/04/2016  . Cerebrovascular accident (CVA) ([place])   . Altered mental status 06/30/2016  . Fall 06/30/2016  . Acute encephalopathy 06/30/2016  . Sepsis ([place]) 06/30/2016  . Diarrhea 06/30/2016  . Acute renal failure superimposed on stage 2 chronic kidney disease ([place]) 06/30/2016  . Hemiparesis affecting right side as late effect of stroke ([place]) 10/23/2015  . PAD (peripheral artery disease) ([place]) 10/20/2011  . Hyperlipidemia 02/21/2010  . CEREBROVASCULAR ACCIDENT, HX OF 12/22/2008  . Diabetes mellitus, type II ([place]) 07/30/2006  . OBESITY, NOS  07/30/2006  . Essential hypertension, benign 07/30/2006    OCCUPATIONAL THERAPY DISCHARGE SUMMARY  Visits from Start of Care: 7  Current functional level related to goals / functional outcomes: See above   Remaining deficits: Spastic R hemiplegia with decr RUE functional use   Education / Equipment: Pt instructed in HEP, splint wear/care (after resting hand splint fabricated), and AE to incr ease/safety with cooking, appropriate community fitness activities.  Pt verbalized understanding of all education provided.  Plan: Patient agrees to discharge.  Patient goals were partially met. Patient is being discharged due to  Reaching maximal rehab potential at this time and being pleased with current functional level.?????       FREEMAN,ANGELA 12/04/2016, 1:23 PM  [facility] [address] [place], [place], [address] Phone: [phone]   Fax:  [phone]  Name: [name] MRN: [id number] Date of Birth: 12/23/1950   Angela Freeman, OTR/L [facility] [address] [place], [place]  [address] [phone] phone [phone] 12/04/16 1:23 PM    

## 2016-12-08 ENCOUNTER — Ambulatory Visit: Payer: Medicare Other | Admitting: Physical Therapy

## 2016-12-08 ENCOUNTER — Encounter: Payer: Self-pay | Admitting: Physical Therapy

## 2016-12-08 ENCOUNTER — Telehealth: Payer: Self-pay | Admitting: Physical Therapy

## 2016-12-08 ENCOUNTER — Encounter: Payer: Medicare Other | Admitting: Occupational Therapy

## 2016-12-08 DIAGNOSIS — R2681 Unsteadiness on feet: Secondary | ICD-10-CM | POA: Diagnosis not present

## 2016-12-08 DIAGNOSIS — I69351 Hemiplegia and hemiparesis following cerebral infarction affecting right dominant side: Secondary | ICD-10-CM | POA: Diagnosis not present

## 2016-12-08 DIAGNOSIS — M6281 Muscle weakness (generalized): Secondary | ICD-10-CM | POA: Diagnosis not present

## 2016-12-08 DIAGNOSIS — R2689 Other abnormalities of gait and mobility: Secondary | ICD-10-CM

## 2016-12-08 NOTE — Therapy (Signed)
[facility] [address] [place], [place], [address] Phone: [phone]   Fax:  [phone]  Physical Therapy Treatment  Patient Details  Name: [name] MRN: [id number] Date of Birth: July 31, 1950 Referring Provider: Dr. Alysia Penna  Encounter Date: 12/08/2016      PT End of Session - 12/08/16 1351    Visit Number 7   Number of Visits 9   Date for PT Re-Evaluation 01/05/17   Authorization Type Medicare Traditional primary-GCODE every 10th visit   PT Start Time 1231   PT Stop Time 1315   PT Time Calculation (min) 44 min   Equipment Utilized During Treatment Gait belt   Activity Tolerance Patient tolerated treatment well   Behavior During Therapy [facility] for tasks assessed/performed      Past Medical History:  Diagnosis Date  . 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 CVA (cerebral infarction) 01/03/2008   MRI: Acute 1 x 1.5 cm infarction affecting the left side of the pons.  . Diabetes mellitus   . DVT (deep venous thrombosis) ([place])   . Hypertension   . PAD (peripheral artery disease) ([place])   . Stroke [facility])    2008    Past Surgical History:  Procedure Laterality Date  . COLONOSCOPY    . None      There were no vitals filed for this visit.      Subjective Assessment - 12/08/16 1354    Subjective No complaints, pain or falls to report.   Pertinent History CVA 2009, DM, DVT, HTN, PAD, L ACA infarct 06/2016   Patient Stated Goals Pt's goal for therapy is to be able to walk faster.   Currently in Pain? No/denies            [facility] Adult PT Treatment/Exercise - 12/08/16 1338      Transfers   Transfers Sit to Stand;Stand to Sit   Sit to Stand 6: Modified independent (Device/Increase time)   Stand to Sit 6: Modified independent (Device/Increase time)     Ambulation/Gait   Ambulation/Gait Yes   Ambulation/Gait Assistance 4: Min guard;5: Supervision   Ambulation/Gait Assistance  Details increased assistance/guarding needed on outdoor surfaces, especially as pt fatigued with gait   Ambulation Distance (Feet) 300 Feet  200 outdoor, 100 indoor   Assistive device Small based quad cane   Gait Pattern Step-through pattern;Decreased weight shift to right;Decreased dorsiflexion - right;Decreased step length - right;Decreased stance time - right   Ambulation Surface Level;Unlevel;Indoor;Outdoor;Grass;Paved   Curb Other (comment)  min guard   Curb Details (indicate cue type and reason) low curb outside up<>down x2, min guard, VC for sequencing; patient preferred and was safe ascending curb using cane & affected R LE first, followed by stronger L LE.   Pre-Gait Activities Used ladder with white slats to focus on reciprocal stepping, 1 foot to a sqaure x3 laps foward using SBQC, VC for step length, supervision.     Exercises   Exercises --   Other Exercises  Standing marches x10 each, VC for posture, weightshifting, intermittent UE support when lifting L knee, full UE support required to lift R knee, min guard; walking forward/backward x3 lap at counter, VC for step length and posture, min guard           Balance Exercises - 12/08/16 1256      Balance Exercises: Standing   Standing Eyes Opened Wide (BOA);Foam/compliant surface;Head turns;Limitations   Standing Eyes Closed Wide (BOA);Foam/compliant surface;20 secs;Limitations  Compliant surface:  ---Airex pad in corner with chair placed in front for support: EO, no UE support, static 20 seconds x2, head turns R<>L x10, up<>down x10; min guard, verbal cues for weight shifting; completed all trials with no LOB. EC, no UE support, static 20 seconds x3, VC for weight shifting, min guard. --SLS: Anterior foam bubble tap x6 R & L, no UE assist 4/6 and light UE assist at counter for 2/6 with R tap, firm UE assist with 6/6 L tap; min guard, VC for posture and weight shifting over supporting leg.        PT Short Term Goals  - 12/01/16 1504      PT SHORT TERM GOAL #1   Title The patient will return demo HEP with written cues.   Baseline Target date 12/02/2015   Time 4   Period Weeks   Status On-going     PT SHORT TERM GOAL #2   Title The patient will improve Berg from 27/56 to > or equal to 33/56 to demo dec'ing risk for falls.   Baseline Target date 12/02/2015   Time 4   Period Weeks   Status On-going     PT SHORT TERM GOAL #3   Title The patient will improve gait speed from 1.02 ft/sec to > or equal to 1.3 ft/sec to transition to "limited community ambulator"   Baseline Patient scores 1.04 ft/sec on 11/26/2015 with cues to move at a faster pace.   Time 4   Period Weeks   Status Partially Met     PT SHORT TERM GOAL #4   Title The patient will negotiate 4 steps with step to pattern and one handrail with supervision    Baseline Met on 11/26/2015   Time 4   Period Weeks   Status Achieved           PT Long Term Goals - 12/04/16 0850      PT LONG TERM GOAL #1   Title Pt will be independent with HEP for improved balance, strength, and gait.  TARGET 12/05/16-EXTENDED x 1 week to be assessed by 12/12/16-AWM (1 week remains in POC)   Time 4   Period Weeks   Status On-going     PT LONG TERM GOAL #2   Title Pt will improve Berg Balance score to at least 41/56 for decreased fall risk.   Time 4   Period Weeks   Status On-going     PT LONG TERM GOAL #3   Title Pt will improve TUG score to less than or equal to 30 seconds for decreased fall risk.   Baseline 28.75 sec 12/04/16   Time 4   Period Weeks   Status Achieved     PT LONG TERM GOAL #4   Title Pt will improve gait velocity to at least 1.6 ft/sec for improved gait efficiency and safety.   Time 4   Period Weeks   Status On-going     PT LONG TERM GOAL #5   Title Pt will verbalize plans for continued community fitness upon D/C from PT.   Time 4   Period Weeks   Status On-going     PT LONG TERM GOAL #6   Title Pt will verbalize understanding  of fall prevention in the home environment.     Time 4   Period Weeks   Status On-going            Plan - 12/08/16 1355    Clinical Impression  Statement Today's session focused on gait using SBQC on unlevel surfaces including inclines and curbs as well as standing balance activities. Patient navigated outdoor level and unlevel surfaces with SBQC with supervision up to min guard as patient fatigued, greatest difficulty noted walking in tall grass, patient required min guard for safety. He demonstrated improved ability to weight shift in DLS on a compliant surface given verbal cues as well as improved SLS requiring less UE support when standing on R. Patient will benefit from continued PT to address unmet goals.   Rehab Potential Good   PT Frequency 2x / week   PT Duration 4 weeks   PT Next Visit Plan LTG's to be checked, continue gait training with SBQC outdoor surfaces, inclines and curbs.      Patient will benefit from skilled therapeutic intervention in order to improve the following deficits and impairments:  Abnormal gait, Decreased balance, Decreased mobility, Difficulty walking, Decreased strength, Postural dysfunction, Impaired tone  Visit Diagnosis: Other abnormalities of gait and mobility  Unsteadiness on feet  Muscle weakness (generalized)     Problem List Patient Active Problem List   Diagnosis Date Noted  . Dysarthria as late effect of stroke 08/18/2016  . Stage 2 chronic kidney disease   . Leukocytosis   . Benign essential HTN   . Spastic hemiparesis ([place])   . Aphasia due to recent cerebral infarction 07/04/2016  . Cerebrovascular accident (CVA) ([place])   . Altered mental status 06/30/2016  . Fall 06/30/2016  . Acute encephalopathy 06/30/2016  . Sepsis ([place]) 06/30/2016  . Diarrhea 06/30/2016  . Acute renal failure superimposed on stage 2 chronic kidney disease ([place]) 06/30/2016  . Hemiparesis affecting right side as late effect of stroke ([place]) 10/23/2015  .  PAD (peripheral artery disease) ([place]) 10/20/2011  . Hyperlipidemia 02/21/2010  . CEREBROVASCULAR ACCIDENT, HX OF 12/22/2008  . Diabetes mellitus, type II ([place]) 07/30/2006  . OBESITY, NOS 07/30/2006  . Essential hypertension, benign 07/30/2006    Rulon Eisenmenger, SPTA 12/08/2016, 2:34 PM  [facility] [address] [place], [place], [address] Phone: [phone]   Fax:  [phone]  Name: [name] MRN: [id number] Date of Birth: Aug 05, 1950  This note has been reviewed and edited by supervising CI.  [name], PTA, [facility] [address] [place],  [address] [phone] 12/08/16, 11:10 PM

## 2016-12-08 NOTE — Telephone Encounter (Signed)
Dr. Letta Pate,   [name] has been attending PT/OT at our neuro clinic and it appears he will benefit from use of a small based quad cane for gait.    If you agree, please submit request in EPIC under referral for DME (small based quad cane) or fax to [place] at [phone].   Thank you,  [name], PTA, [facility] [address], [place], [place] [address] [phone] 12/08/16, 12:27 PM   [facility] [address] [place], [place]  [address] Phone:  [phone] Fax:  [phone]

## 2016-12-09 ENCOUNTER — Other Ambulatory Visit: Payer: Self-pay | Admitting: *Deleted

## 2016-12-09 MED ORDER — METFORMIN HCL 1000 MG PO TABS: 1000.0000 mg | ORAL_TABLET | Freq: Two times a day (BID) | ORAL | 0 refills | Status: DC

## 2016-12-10 NOTE — Telephone Encounter (Signed)
May order small based quad cane

## 2016-12-11 ENCOUNTER — Ambulatory Visit: Payer: Medicare Other | Admitting: Physical Therapy

## 2016-12-11 ENCOUNTER — Encounter: Payer: Medicare Other | Admitting: Occupational Therapy

## 2016-12-11 NOTE — Telephone Encounter (Signed)
Ordered and faxed.

## 2016-12-18 ENCOUNTER — Ambulatory Visit: Payer: Medicare Other | Admitting: Physical Therapy

## 2016-12-18 DIAGNOSIS — M6281 Muscle weakness (generalized): Secondary | ICD-10-CM | POA: Diagnosis not present

## 2016-12-18 DIAGNOSIS — R2681 Unsteadiness on feet: Secondary | ICD-10-CM

## 2016-12-18 DIAGNOSIS — R2689 Other abnormalities of gait and mobility: Secondary | ICD-10-CM

## 2016-12-18 DIAGNOSIS — I69351 Hemiplegia and hemiparesis following cerebral infarction affecting right dominant side: Secondary | ICD-10-CM | POA: Diagnosis not present

## 2016-12-19 NOTE — Therapy (Signed)
[facility] [address] [place], [place], [address] Phone: [phone]   Fax:  [phone]  Physical Therapy Treatment  Patient Details  Name: [name] MRN: [id number] Date of Birth: 1951/03/21 Referring Provider: Dr. Alysia Penna  Encounter Date: 12/18/2016      PT End of Session - 12/19/16 0815    Visit Number 8   Number of Visits 16   Date for PT Re-Evaluation 02/17/17  per new 4 week recert 08/14/95   Authorization Type Medicare Traditional primary-GCODE every 10th visit   PT Start Time 0840   PT Stop Time 0933   PT Time Calculation (min) 53 min   Equipment Utilized During Treatment Gait belt   Activity Tolerance Patient tolerated treatment well   Behavior During Therapy [facility] for tasks assessed/performed      Past Medical History:  Diagnosis Date  . 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 CVA (cerebral infarction) 01/03/2008   MRI: Acute 1 x 1.5 cm infarction affecting the left side of the pons.  . Diabetes mellitus   . DVT (deep venous thrombosis) ([place])   . Hypertension   . PAD (peripheral artery disease) ([place])   . Stroke [facility])    2008    Past Surgical History:  Procedure Laterality Date  . COLONOSCOPY    . None      There were no vitals filed for this visit.      Subjective Assessment - 12/18/16 0846    Subjective Feels like I've overdone it with exercise.  Wore 2# weight on RLE, and now I feel more stiff.   Pertinent History CVA 2009, DM, DVT, HTN, PAD, L ACA infarct 06/2016   Patient Stated Goals Pt's goal for therapy is to be able to walk faster.   Currently in Pain? No/denies                         [facility] Adult PT Treatment/Exercise - 12/19/16 0806      Ambulation/Gait   Ambulation/Gait Yes   Ambulation/Gait Assistance 4: Min guard;5: Supervision   Ambulation/Gait Assistance Details Pt has two episodes of "pitching forward" with gait on grass  outside, using SBQC, cues needed to adjust cane placement.   Ambulation Distance (Feet) 200 Feet  indoors, 100 ft outdoors; 100 ft x 2 with [facility]   Assistive device Large base quad cane;Small based quad cane   Gait Pattern Step-through pattern;Decreased weight shift to right;Decreased dorsiflexion - right;Decreased step length - right;Decreased stance time - right   Ambulation Surface Level;Indoor;Unlevel;Outdoor;Grass   Gait velocity 25.72 sec iwth LBQC (1.27 ft/sec); 24.97 sec with [facility] = 1.51f/sec     Standardized Balance Assessment   Standardized Balance Assessment Berg Balance Test     Berg Balance Test   Sit to Stand Able to stand  independently using hands   Standing Unsupported Able to stand safely 2 minutes   Sitting with Back Unsupported but Feet Supported on Floor or Stool Able to sit safely and securely 2 minutes   Stand to Sit Sits safely with minimal use of hands   Transfers Able to transfer safely, minor use of hands   Standing Unsupported with Eyes Closed Able to stand 10 seconds safely   Standing Ubsupported with Feet Together Able to place feet together independently and stand 1 minute safely   From Standing, Reach Forward with Outstretched Arm Can reach forward >12 cm safely (5")   From  Standing Position, Pick up Object from Floor Able to pick up shoe safely and easily   From Standing Position, Turn to Look Behind Over each Shoulder Looks behind from both sides and weight shifts well   Turn 360 Degrees Able to turn 360 degrees safely but slowly   Standing Unsupported, Alternately Place Feet on Step/Stool Needs assistance to keep from falling or unable to try   Standing Unsupported, One Foot in [place] to take small step independently and hold 30 seconds   Standing on One Leg Tries to lift leg/unable to hold 3 seconds but remains standing independently   Total Score 43     Self-Care   Self-Care Other Self-Care Comments   Other Self-Care Comments  Discussed pt's c/o  stiffness in RLE after using 2# weight,including discussion that weightbearing activities to promote equal weightshifting and weightbearing through RLE are most helpful to reduce tone/stiffness; verbally reviewed that as part of pt's HEP.  Discussed with patient that order for [facility] has been received and discussed how to most easily obtain [facility] (stocked equipment closet at clinic does not have SBQC today).  Discussed POC, progress towards goals and discussed continuing PT due to safety concerns on unlevel, grassy outdoor surfaces using SBQC.                PT Education - 12/19/16 [phone]    Education provided Yes   Education Details See self-care-POC, progres towards goals, obtaining SBQC and continued gait training needed on outdoor surfaces   Person(s) Educated Patient   Methods Explanation   Comprehension Verbalized understanding          PT Short Term Goals - 12/01/16 1504      PT SHORT TERM GOAL #1   Title The patient will return demo HEP with written cues.   Baseline Target date 12/02/2015   Time 4   Period Weeks   Status On-going     PT SHORT TERM GOAL #2   Title The patient will improve Berg from 27/56 to > or equal to 33/56 to demo dec'ing risk for falls.   Baseline Target date 12/02/2015   Time 4   Period Weeks   Status On-going     PT SHORT TERM GOAL #3   Title The patient will improve gait speed from 1.02 ft/sec to > or equal to 1.3 ft/sec to transition to "limited community ambulator"   Baseline Patient scores 1.04 ft/sec on 11/26/2015 with cues to move at a faster pace.   Time 4   Period Weeks   Status Partially Met     PT SHORT TERM GOAL #4   Title The patient will negotiate 4 steps with step to pattern and one handrail with supervision    Baseline Met on 11/26/2015   Time 4   Period Weeks   Status Achieved           PT Long Term Goals - 12/18/16 0849      PT LONG TERM GOAL #1   Title Pt will be independent with HEP for improved balance, strength, and  gait.  TARGET 12/05/16-EXTENDED x 1 week to be assessed by 12/12/16-AWM (1 week remains in POC)   Baseline per patient report   Time 4   Period Weeks   Status Achieved     PT LONG TERM GOAL #2   Title Pt will improve Berg Balance score to at least 41/56 for decreased fall risk.   Baseline 43/56 12/18/16   Time 4  Period Weeks   Status Achieved     PT LONG TERM GOAL #3   Title Pt will improve TUG score to less than or equal to 30 seconds for decreased fall risk.   Baseline 28.75 sec 12/04/16   Time 4   Period Weeks   Status Achieved     PT LONG TERM GOAL #4   Title Pt will improve gait velocity to at least 1.6 ft/sec for improved gait efficiency and safety.  ONGOING GOAL:  TARGET 01/17/17   Baseline 1.31 ft/sec 12/18/16   Time 4  per recert 3/47/42   Period Weeks   Status On-going     PT LONG TERM GOAL #5   Title Pt will verbalize plans for continued community fitness upon D/C from PT.  ONGOING TARGET 01/17/17   Time 4  per recert 5/95/63   Period Weeks   Status On-going     PT LONG TERM GOAL #6   Title Pt will verbalize understanding of fall prevention in the home environment.  ONGOING GOAL, TARGET 01/17/17   Time 4  per recert 8/75/64   Period Weeks   Status On-going     PT LONG TERM GOAL #7   Title Pt will negotiate outdoor surfaces, including sidewalk, curb, grassy surfaces, using SBQC, modified independently, at least 150 ft, for improved safety with outdoor surface negoitaiton.  TARGET 01/17/17   Time 4  per recert 3/32/95   Period Weeks   Status New               Plan - 12/19/16 0817    Clinical Impression Statement Assessed LTGs this visit, with pt meeting LTG 1, 2, and 3.  LTG 4-6 ongoing and new LTG 7 added for outdoor gait with SBQC.  Pt has improved BERG and TUG scores, and desires to progress to [facility], but continues to have unsteadiness on grass and outdoor surfaces with SBQC.  Pt's gait velocity with SBQC is slightly increased that with SBQC, and pt will  likely be more efficienct with gait using SBQC versus large base. Will benefit from additional training with [facility] on outdoor surfaces.   Rehab Potential Good   PT Frequency 2x / week   PT Duration 4 weeks  Per recert 1/88/41   PT Next Visit Plan Gait training with [facility] outdoor, compliant surfaces (work on balance strategies if able-hip/ankle/step-add to HEP?); follow up with [place] regarding obtaining SBQC for patient   Consulted and Agree with Plan of Care Patient      Patient will benefit from skilled therapeutic intervention in order to improve the following deficits and impairments:  Abnormal gait, Decreased balance, Decreased mobility, Difficulty walking, Decreased strength, Postural dysfunction, Impaired tone  Visit Diagnosis: Other abnormalities of gait and mobility  Unsteadiness on feet  Muscle weakness (generalized)     Problem List Patient Active Problem List   Diagnosis Date Noted  . Dysarthria as late effect of stroke 08/18/2016  . Stage 2 chronic kidney disease   . Leukocytosis   . Benign essential HTN   . Spastic hemiparesis ([place])   . Aphasia due to recent cerebral infarction 07/04/2016  . Cerebrovascular accident (CVA) ([place])   . Altered mental status 06/30/2016  . Fall 06/30/2016  . Acute encephalopathy 06/30/2016  . Sepsis ([place]) 06/30/2016  . Diarrhea 06/30/2016  . Acute renal failure superimposed on stage 2 chronic kidney disease ([place]) 06/30/2016  . Hemiparesis affecting right side as late effect of stroke ([place]) 10/23/2015  .  PAD (peripheral artery disease) ([place]) 10/20/2011  . Hyperlipidemia 02/21/2010  . CEREBROVASCULAR ACCIDENT, HX OF 12/22/2008  . Diabetes mellitus, type II ([place]) 07/30/2006  . OBESITY, NOS 07/30/2006  . Essential hypertension, benign 07/30/2006    Frazier Butt. 12/19/2016, 8:28 AM  Frazier Butt., PT   [facility] [address] [place], [place],  [address] Phone: [phone]   Fax:  [phone]  Name: [name] MRN: [id number] Date of Birth: Oct 28, 1950

## 2016-12-23 ENCOUNTER — Encounter: Payer: Self-pay | Admitting: Physical Medicine & Rehabilitation

## 2016-12-23 ENCOUNTER — Encounter: Payer: Medicare Other | Attending: Physical Medicine & Rehabilitation

## 2016-12-23 ENCOUNTER — Ambulatory Visit (HOSPITAL_BASED_OUTPATIENT_CLINIC_OR_DEPARTMENT_OTHER): Payer: Medicare Other | Admitting: Physical Medicine & Rehabilitation

## 2016-12-23 VITALS — BP 123/69 | HR 67

## 2016-12-23 DIAGNOSIS — Z8249 Family history of ischemic heart disease and other diseases of the circulatory system: Secondary | ICD-10-CM | POA: Insufficient documentation

## 2016-12-23 DIAGNOSIS — G8111 Spastic hemiplegia affecting right dominant side: Secondary | ICD-10-CM | POA: Diagnosis not present

## 2016-12-23 DIAGNOSIS — Z86718 Personal history of other venous thrombosis and embolism: Secondary | ICD-10-CM | POA: Insufficient documentation

## 2016-12-23 DIAGNOSIS — T8611 Kidney transplant rejection: Secondary | ICD-10-CM | POA: Diagnosis not present

## 2016-12-23 DIAGNOSIS — I739 Peripheral vascular disease, unspecified: Secondary | ICD-10-CM | POA: Insufficient documentation

## 2016-12-23 DIAGNOSIS — N172 Acute kidney failure with medullary necrosis: Secondary | ICD-10-CM | POA: Insufficient documentation

## 2016-12-23 DIAGNOSIS — E1122 Type 2 diabetes mellitus with diabetic chronic kidney disease: Secondary | ICD-10-CM | POA: Insufficient documentation

## 2016-12-23 DIAGNOSIS — Z833 Family history of diabetes mellitus: Secondary | ICD-10-CM | POA: Insufficient documentation

## 2016-12-23 DIAGNOSIS — I639 Cerebral infarction, unspecified: Secondary | ICD-10-CM | POA: Diagnosis not present

## 2016-12-23 DIAGNOSIS — G8110 Spastic hemiplegia affecting unspecified side: Secondary | ICD-10-CM | POA: Diagnosis not present

## 2016-12-23 DIAGNOSIS — N189 Chronic kidney disease, unspecified: Secondary | ICD-10-CM | POA: Insufficient documentation

## 2016-12-23 DIAGNOSIS — I69322 Dysarthria following cerebral infarction: Secondary | ICD-10-CM | POA: Diagnosis not present

## 2016-12-23 DIAGNOSIS — Z794 Long term (current) use of insulin: Secondary | ICD-10-CM | POA: Diagnosis not present

## 2016-12-23 DIAGNOSIS — I129 Hypertensive chronic kidney disease with stage 1 through stage 4 chronic kidney disease, or unspecified chronic kidney disease: Secondary | ICD-10-CM | POA: Insufficient documentation

## 2016-12-23 NOTE — Progress Notes (Signed)
Subjective:    Patient ID: [name], male    DOB: 16-Jun-1950, 66 y.o.   MRN: [id number]  HPI 10/14/2016 Biceps 400 units FCR, 200 units. FDS 200 units. Pronator teres 100 units. PQ, 100 units More relaxed in Right hand and able to use R hand more  Patient go to outpatient PT, OT, has a new cane on order, with small base Pain Inventory Average Pain 0 Pain Right Now 0 My pain is na  In the last 24 hours, has pain interfered with the following? General activity 0 Relation with others 0 Enjoyment of life 0 What TIME of day is your pain at its worst? na Sleep (in general) Good  Pain is worse with: na Pain improves with: na Relief from Meds: na  Mobility walk with assistance use a cane ability to climb steps?  yes do you drive?  no  Function retired  Neuro/Psych spasms  Prior Studies Any changes since last visit?  no  Physicians involved in your care Any changes since last visit?  no   Family History  Problem Relation Age of Onset  . Diabetes Mother   . Heart attack Mother   . Hypertension Mother   . Diabetes Sister   . Hypertension Sister   . Stroke Sister        2017  . Hypertension Brother   . Hypertension Daughter   . Hypertension Son   . Colon cancer Neg Hx   . Esophageal cancer Neg Hx   . Stomach cancer Neg Hx   . Rectal cancer Neg Hx    Social History   Social History  . Marital status: Divorced    Spouse name: N/A  . Number of children: 3  . Years of education: 14   Occupational History  . Disabled-security guard [place] History Main Topics  . Smoking status: Never Smoker  . Smokeless tobacco: Never Used  . Alcohol use No  . Drug use: No  . Sexual activity: No   Other Topics Concern  . None   Social History Narrative   Divorced, lives alone   2 sons, 1 daughter   Retired/disabled   No caffeine   12/24/2015      Past Surgical History:  Procedure Laterality Date  . COLONOSCOPY    . None      Past Medical History:  Diagnosis Date  . 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 CVA (cerebral infarction) 01/03/2008   MRI: Acute 1 x 1.5 cm infarction affecting the left side of the pons.  . Diabetes mellitus   . DVT (deep venous thrombosis) ([place])   . Hypertension   . PAD (peripheral artery disease) ([place])   . Stroke [facility])    2008   There were no vitals taken for this visit.  Opioid Risk Score:   Fall Risk Score:  `1  Depression screen PHQ 2/9  Depression screen [facility] 2/9 11/30/2015 11/05/2015 10/23/2015 09/25/2015 05/02/2014 05/09/2013  Decreased Interest 0 0 0 0 0 0  Down, Depressed, Hopeless 0 0 0 0 - 0  PHQ - 2 Score 0 0 0 0 0 0     Review of Systems  Constitutional: Negative.   HENT: Negative.   Eyes: Negative.   Respiratory: Negative.   Cardiovascular: Negative.   Gastrointestinal: Negative.   Endocrine: Negative.   Genitourinary: Negative.   Musculoskeletal: Negative.   Skin: Negative.   Allergic/Immunologic: Negative.   Neurological: Negative.  Hematological: Negative.   Psychiatric/Behavioral: Negative.   All other systems reviewed and are negative.      Objective:   Physical Exam  Constitutional: He appears well-developed and well-nourished.  HENT:  Head: Normocephalic and atraumatic.  Eyes: Pupils are equal, round, and reactive to light. Conjunctivae and EOM are normal.  Psychiatric: His speech is delayed and slurred. He is slowed.  Nursing note and vitals reviewed.   [place]worth 3 at FDP on Right side Ashworth 2 at the biceps, Ashworth 2 at the FCR, Ashworth 1 at the thumb flexor. [place] 2 at the pronators  Motor strength is 3 minus in the right deltoid, bicep, tricep, 2 minus, finger extensors, 3 at the finger flexors. Ambulates with a cane as well as a right double metal upright AFO. Speech is severely dysarthric     Assessment & Plan:  1. 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 Spasticity responded well to  Dysport Recommend the following doses to be performed, not prior to 01/14/2017  Biceps 300 units FCR, 200 units. FDS 200 units. FDP 100 Pronator teres 100 units. PQ, 100 units

## 2016-12-23 NOTE — Patient Instructions (Signed)
We will make some minor dosage adjustments for your next injection. Will reduce biceps from 400 units to 300 units and put additional 100 units into the right FDP, which is a finger flexor

## 2016-12-30 ENCOUNTER — Ambulatory Visit: Payer: Medicare Other | Admitting: Physical Therapy

## 2016-12-30 DIAGNOSIS — R2689 Other abnormalities of gait and mobility: Secondary | ICD-10-CM

## 2016-12-30 DIAGNOSIS — M6281 Muscle weakness (generalized): Secondary | ICD-10-CM | POA: Diagnosis not present

## 2016-12-30 DIAGNOSIS — R2681 Unsteadiness on feet: Secondary | ICD-10-CM | POA: Diagnosis not present

## 2016-12-30 DIAGNOSIS — I69351 Hemiplegia and hemiparesis following cerebral infarction affecting right dominant side: Secondary | ICD-10-CM | POA: Diagnosis not present

## 2016-12-30 NOTE — Therapy (Signed)
[facility] [address] [place], [place], [address] Phone: [phone]   Fax:  [phone]  Physical Therapy Treatment  Patient Details  Name: [name] MRN: [id number] Date of Birth: Apr 15, 1951 Referring Provider: Dr. Alysia Penna  Encounter Date: 12/30/2016      PT End of Session - 12/30/16 1608    Visit Number 9   Number of Visits 16   Date for PT Re-Evaluation 02/17/17  per new 4 week recert 6/96/29   Authorization Type Medicare Traditional primary-GCODE every 10th visit   PT Start Time 0934   PT Stop Time 1015   PT Time Calculation (min) 41 min   Equipment Utilized During Treatment Gait belt   Activity Tolerance Patient tolerated treatment well   Behavior During Therapy [facility] for tasks assessed/performed      Past Medical History:  Diagnosis Date  . 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 CVA (cerebral infarction) 01/03/2008   MRI: Acute 1 x 1.5 cm infarction affecting the left side of the pons.  . Diabetes mellitus   . DVT (deep venous thrombosis) ([place])   . Hypertension   . PAD (peripheral artery disease) ([place])   . Stroke [facility])    2008    Past Surgical History:  Procedure Laterality Date  . COLONOSCOPY    . None      There were no vitals filed for this visit.      Subjective Assessment - 12/30/16 0934    Subjective Haven't gotten the small base quad cane yet-I need to wait until after 8/3 to be able to make the co-pay.  I did talk to them yesterday, though.  No falls.   Pertinent History CVA 2009, DM, DVT, HTN, PAD, L ACA infarct 06/2016   Patient Stated Goals Pt's goal for therapy is to be able to walk faster.   Currently in Pain? No/denies                         [facility] Adult PT Treatment/Exercise - 12/30/16 0940      Ambulation/Gait   Ambulation/Gait Yes   Ambulation/Gait Assistance 4: Min guard;5: Supervision   Ambulation Distance (Feet) 230 Feet   80 ft, then 50 ft; 40 ft x 2   Assistive device Large base quad cane;Small based quad cane   Gait Pattern Step-through pattern;Decreased weight shift to right;Decreased dorsiflexion - right;Decreased step length - right;Decreased stance time - right   Ambulation Surface Level;Indoor;Unlevel   Pre-Gait Activities Gait activities negoitating red compliant and blue compliant mat surfaces, varied surfaces with bean bags underneath, using small based quad cane forward and turns, x 3 reps, with min guard assistance.  Cues provided initially for improved foot clearance, with one episode of R foot catching on unlevel suface.     High Level Balance   High Level Balance Comments Anterior/posterior weightshifting (ankle strategy), x 20 reps, then wall bumps anterior/posterior for hip strategy x 20 reps.  In corner on solid surface, then on pillow/compliant surface-wide BOS head turns and head nods x 5 reps EO, then EC x 10 seconds,     Pt requires extra time stepping onto and off of compliant surfaces, cues for increased foot clearance.             PT Short Term Goals - 12/01/16 1504      PT SHORT TERM GOAL #1   Title The patient will return demo HEP with  written cues.   Baseline Target date 12/02/2015   Time 4   Period Weeks   Status On-going     PT SHORT TERM GOAL #2   Title The patient will improve Berg from 27/56 to > or equal to 33/56 to demo dec'ing risk for falls.   Baseline Target date 12/02/2015   Time 4   Period Weeks   Status On-going     PT SHORT TERM GOAL #3   Title The patient will improve gait speed from 1.02 ft/sec to > or equal to 1.3 ft/sec to transition to "limited community ambulator"   Baseline Patient scores 1.04 ft/sec on 11/26/2015 with cues to move at a faster pace.   Time 4   Period Weeks   Status Partially Met     PT SHORT TERM GOAL #4   Title The patient will negotiate 4 steps with step to pattern and one handrail with supervision    Baseline Met on  11/26/2015   Time 4   Period Weeks   Status Achieved           PT Long Term Goals - 12/18/16 0849      PT LONG TERM GOAL #1   Title Pt will be independent with HEP for improved balance, strength, and gait.  TARGET 12/05/16-EXTENDED x 1 week to be assessed by 12/12/16-AWM (1 week remains in POC)   Baseline per patient report   Time 4   Period Weeks   Status Achieved     PT LONG TERM GOAL #2   Title Pt will improve Berg Balance score to at least 41/56 for decreased fall risk.   Baseline 43/56 12/18/16   Time 4   Period Weeks   Status Achieved     PT LONG TERM GOAL #3   Title Pt will improve TUG score to less than or equal to 30 seconds for decreased fall risk.   Baseline 28.75 sec 12/04/16   Time 4   Period Weeks   Status Achieved     PT LONG TERM GOAL #4   Title Pt will improve gait velocity to at least 1.6 ft/sec for improved gait efficiency and safety.  ONGOING GOAL:  TARGET 01/17/17   Baseline 1.31 ft/sec 12/18/16   Time 4  per recert 0/09/38   Period Weeks   Status On-going     PT LONG TERM GOAL #5   Title Pt will verbalize plans for continued community fitness upon D/C from PT.  ONGOING TARGET 01/17/17   Time 4  per recert 1/82/99   Period Weeks   Status On-going     PT LONG TERM GOAL #6   Title Pt will verbalize understanding of fall prevention in the home environment.  ONGOING GOAL, TARGET 01/17/17   Time 4  per recert 3/71/69   Period Weeks   Status On-going     PT LONG TERM GOAL #7   Title Pt will negotiate outdoor surfaces, including sidewalk, curb, grassy surfaces, using SBQC, modified independently, at least 150 ft, for improved safety with outdoor surface negoitaiton.  TARGET 01/17/17   Time 4  per recert 6/78/93   Period Weeks   Status New               Plan - 12/30/16 1609    Clinical Impression Statement Treatment session today continued to focus on gait training, compliant surface training with SBQC, and with balance strategy/compliant  surface balance work.  Pt has talked with [place]  and will be getting the [facility] after 01/02/17, when he can get them the copayment.  Will continue to work towards [place].   Rehab Potential Good   PT Frequency 2x / week   PT Duration 4 weeks  Per recert 0/76/22   PT Next Visit Plan Gait training with [facility] outdoor, compliant surfaces (work on balance strategies if able-hip/ankle/step-add to HEP?); GCODE next visit   Consulted and Agree with Plan of Care Patient      Patient will benefit from skilled therapeutic intervention in order to improve the following deficits and impairments:  Abnormal gait, Decreased balance, Decreased mobility, Difficulty walking, Decreased strength, Postural dysfunction, Impaired tone  Visit Diagnosis: Other abnormalities of gait and mobility  Unsteadiness on feet     Problem List Patient Active Problem List   Diagnosis Date Noted  . Dysarthria as late effect of stroke 08/18/2016  . Stage 2 chronic kidney disease   . Leukocytosis   . Benign essential HTN   . Spastic hemiparesis ([place])   . Aphasia due to recent cerebral infarction 07/04/2016  . Cerebrovascular accident (CVA) ([place])   . Altered mental status 06/30/2016  . Fall 06/30/2016  . Acute encephalopathy 06/30/2016  . Sepsis ([place]) 06/30/2016  . Diarrhea 06/30/2016  . Acute renal failure superimposed on stage 2 chronic kidney disease ([place]) 06/30/2016  . Hemiparesis affecting right side as late effect of stroke ([place]) 10/23/2015  . PAD (peripheral artery disease) ([place]) 10/20/2011  . Hyperlipidemia 02/21/2010  . CEREBROVASCULAR ACCIDENT, HX OF 12/22/2008  . Diabetes mellitus, type II ([place]) 07/30/2006  . OBESITY, NOS 07/30/2006  . Essential hypertension, benign 07/30/2006    Frazier Butt. 12/30/2016, 4:15 PM  Frazier Butt., PT   [facility] [address] [place], [place], [address] Phone: [phone]   Fax:   [phone]  Name: GREGROY DOMBKOWSKI MRN: [id number] Date of Birth: 1951-04-24

## 2017-01-01 ENCOUNTER — Ambulatory Visit: Payer: Medicare Other | Attending: Physical Medicine & Rehabilitation | Admitting: Physical Therapy

## 2017-01-01 DIAGNOSIS — M6281 Muscle weakness (generalized): Secondary | ICD-10-CM | POA: Insufficient documentation

## 2017-01-01 DIAGNOSIS — R2681 Unsteadiness on feet: Secondary | ICD-10-CM | POA: Diagnosis not present

## 2017-01-01 DIAGNOSIS — R2689 Other abnormalities of gait and mobility: Secondary | ICD-10-CM | POA: Diagnosis not present

## 2017-01-01 NOTE — Therapy (Signed)
[facility] [address] [place], [place], [address] Phone: [phone]   Fax:  [phone]  Physical Therapy Treatment  Patient Details  Name: [name] MRN: [id number] Date of Birth: 1950-10-26 Referring Provider: Dr. Alysia Penna  Encounter Date: 01/01/2017      PT End of Session - 01/01/17 1205    Visit Number 10   Number of Visits 16   Date for PT Re-Evaluation 02/17/17  per new 4 week recert 5/36/46   Authorization Type Medicare Traditional primary-GCODE every 10th visit   PT Start Time 0806   PT Stop Time 0846   PT Time Calculation (min) 40 min   Equipment Utilized During Treatment Gait belt   Activity Tolerance Patient tolerated treatment well   Behavior During Therapy [facility] for tasks assessed/performed      Past Medical History:  Diagnosis Date  . 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 CVA (cerebral infarction) 01/03/2008   MRI: Acute 1 x 1.5 cm infarction affecting the left side of the pons.  . Diabetes mellitus   . DVT (deep venous thrombosis) ([place])   . Hypertension   . PAD (peripheral artery disease) ([place])   . Stroke [facility])    2008    Past Surgical History:  Procedure Laterality Date  . COLONOSCOPY    . None      There were no vitals filed for this visit.      Subjective Assessment - 01/01/17 0809    Subjective NO changes.  It's early this morning.  I'll try to get to [place] this week and get a cane (would like a grip that is softer).   Pertinent History CVA 2009, DM, DVT, HTN, PAD, L ACA infarct 06/2016   Patient Stated Goals Pt's goal for therapy is to be able to walk faster.   Currently in Pain? No/denies                         [facility] Adult PT Treatment/Exercise - 01/01/17 0001      Transfers   Transfers Sit to Stand;Stand to Sit   Sit to Stand 6: Modified independent (Device/Increase time)   Stand to Sit 6: Modified independent  (Device/Increase time)   Number of Reps Other reps (comment)  5 reps throughout session     Ambulation/Gait   Ambulation/Gait Yes   Ambulation/Gait Assistance 4: Min guard;5: Supervision   Ambulation/Gait Assistance Details Pt needs cues for inceased step length to allow for foot clearance onto and off of red mat surfaces.   Ambulation Distance (Feet) 230 Feet  80 ft x 2, 50 ft x 2   Assistive device Small based quad cane;Large base quad cane   Gait Pattern Step-through pattern;Decreased weight shift to right;Decreased dorsiflexion - right;Decreased step length - right;Decreased stance time - right   Ambulation Surface Level;Indoor;Unlevel   Ramp 5: Supervision;4: Min assist   Ramp Details (indicate cue type and reason) Cues for full cane placement on ramp surface.  Performed x 2 reps   Pre-Gait Activities Gait activities on compliant mat surface-forward stepping on and off mat (with cues for increased step length and foot clearance. Gait on compliant mat surface with turns, cues at times to keep [facility] fully on mat.             Balance Exercises - 01/01/17 0826      Balance Exercises: Standing   Standing Eyes Opened Wide (BOA);Foam/compliant  surface;Head turns;Limitations   Standing Eyes Closed Wide (BOA);Foam/compliant surface;20 secs;Limitations   Stepping Strategy Anterior;Posterior;Lateral;10 reps  Each leg, at counter   Other Standing Exercises Anterior/posterior weigthshift through ankles x 10 reps, then  wall bumps for ant/post hip strategy x 20 reps             PT Short Term Goals - 12/01/16 1504      PT SHORT TERM GOAL #1   Title The patient will return demo HEP with written cues.   Baseline Target date 12/02/2015   Time 4   Period Weeks   Status On-going     PT SHORT TERM GOAL #2   Title The patient will improve Berg from 27/56 to > or equal to 33/56 to demo dec'ing risk for falls.   Baseline Target date 12/02/2015   Time 4   Period Weeks   Status On-going      PT SHORT TERM GOAL #3   Title The patient will improve gait speed from 1.02 ft/sec to > or equal to 1.3 ft/sec to transition to "limited community ambulator"   Baseline Patient scores 1.04 ft/sec on 11/26/2015 with cues to move at a faster pace.   Time 4   Period Weeks   Status Partially Met     PT SHORT TERM GOAL #4   Title The patient will negotiate 4 steps with step to pattern and one handrail with supervision    Baseline Met on 11/26/2015   Time 4   Period Weeks   Status Achieved           PT Long Term Goals - 12/18/16 0849      PT LONG TERM GOAL #1   Title Pt will be independent with HEP for improved balance, strength, and gait.  TARGET 12/05/16-EXTENDED x 1 week to be assessed by 12/12/16-AWM (1 week remains in POC)   Baseline per patient report   Time 4   Period Weeks   Status Achieved     PT LONG TERM GOAL #2   Title Pt will improve Berg Balance score to at least 41/56 for decreased fall risk.   Baseline 43/56 12/18/16   Time 4   Period Weeks   Status Achieved     PT LONG TERM GOAL #3   Title Pt will improve TUG score to less than or equal to 30 seconds for decreased fall risk.   Baseline 28.75 sec 12/04/16   Time 4   Period Weeks   Status Achieved     PT LONG TERM GOAL #4   Title Pt will improve gait velocity to at least 1.6 ft/sec for improved gait efficiency and safety.  ONGOING GOAL:  TARGET 01/17/17   Baseline 1.31 ft/sec 12/18/16   Time 4  per recert 3/64/38   Period Weeks   Status On-going     PT LONG TERM GOAL #5   Title Pt will verbalize plans for continued community fitness upon D/C from PT.  ONGOING TARGET 01/17/17   Time 4  per recert 3/77/93   Period Weeks   Status On-going     PT LONG TERM GOAL #6   Title Pt will verbalize understanding of fall prevention in the home environment.  ONGOING GOAL, TARGET 01/17/17   Time 4  per recert 9/68/86   Period Weeks   Status On-going     PT LONG TERM GOAL #7   Title Pt will negotiate outdoor  surfaces, including sidewalk, curb, grassy surfaces, using SBQC, modified  independently, at least 150 ft, for improved safety with outdoor surface negoitaiton.  TARGET 01/17/17   Time 4  per recert 6/60/63   Period Weeks   Status New               Plan - 20-Jan-2017 1205    Clinical Impression Statement Continued to focus treatment on balance and compliant suraface activities.  Pt needs cues for foot clearance when stepping on and off compliant surfaces, at times needing min guard for safety.  Pt will be obtaining SBQC in the next week and will benefit from continued training using [facility] for improved safety and efficiency of gait.   Rehab Potential Good   PT Frequency 2x / week   PT Duration 4 weeks  Per recert 0/16/01   PT Next Visit Plan Gait training with [facility] outdoor, compliant surfaces -consider adding hip/ankle/step strategies to HEP   Consulted and Agree with Plan of Care Patient      Patient will benefit from skilled therapeutic intervention in order to improve the following deficits and impairments:  Abnormal gait, Decreased balance, Decreased mobility, Difficulty walking, Decreased strength, Postural dysfunction, Impaired tone  Visit Diagnosis: Other abnormalities of gait and mobility  Unsteadiness on feet       G-Codes - Jan 20, 2017 1210    Functional Assessment Tool Used (Outpatient Only) gt velocity 1.31 ft/sec with SBQC, Berg 43/56, TUG 25.82 sec   Functional Limitation Mobility: Walking and moving around   Mobility: Walking and Moving Around Current Status [phone]) At least 20 percent but less than 40 percent impaired, limited or restricted   Mobility: Walking and Moving Around Goal Status [phone]) At least 20 percent but less than 40 percent impaired, limited or restricted      Problem List Patient Active Problem List   Diagnosis Date Noted  . Dysarthria as late effect of stroke 08/18/2016  . Stage 2 chronic kidney disease   . Leukocytosis   . Benign essential  HTN   . Spastic hemiparesis ([place])   . Aphasia due to recent cerebral infarction 07/04/2016  . Cerebrovascular accident (CVA) ([place])   . Altered mental status 06/30/2016  . Fall 06/30/2016  . Acute encephalopathy 06/30/2016  . Sepsis ([place]) 06/30/2016  . Diarrhea 06/30/2016  . Acute renal failure superimposed on stage 2 chronic kidney disease ([place]) 06/30/2016  . Hemiparesis affecting right side as late effect of stroke ([place]) 10/23/2015  . PAD (peripheral artery disease) ([place]) 10/20/2011  . Hyperlipidemia 02/21/2010  . CEREBROVASCULAR ACCIDENT, HX OF 12/22/2008  . Diabetes mellitus, type II ([place]) 07/30/2006  . OBESITY, NOS 07/30/2006  . Essential hypertension, benign 07/30/2006    Jermya Dowding W. January 20, 2017, 12:12 PM Frazier Butt., PT [facility] [address] [place], [place], [address] Phone: [phone]   Fax:  [phone]  Name: GIACOMO VALONE MRN: [id number] Date of Birth: 1950-11-22   Physical Therapy Progress Note  Dates of Reporting Period: 11/06/16 to 01-20-2017  Objective Reports of Subjective Statement: No LOB, no falls  Objective Measurements: Berg 43/56, TUG 28.72 sec, gait velocity with SBQC 1.31 ft/sec  Goal Update: See goals assessed above, with goals updated 12/18/16  Plan: Continue to address gait training with [facility], indoor and outdoor surfaces, for progression to [facility]  Reason Skilled Services are Required: Pt improved gait and balance scores, and will continue to benefit from skilled PT to further address gait and balance towards LTGs and improved functional mobility.  Mady Haagensen, PT 01-20-17 12:15 PM Phone:  [phone] Fax: [phone]

## 2017-01-05 ENCOUNTER — Ambulatory Visit: Payer: Medicare Other | Admitting: Physical Therapy

## 2017-01-05 ENCOUNTER — Encounter: Payer: Self-pay | Admitting: Physical Therapy

## 2017-01-05 ENCOUNTER — Other Ambulatory Visit: Payer: Self-pay | Admitting: *Deleted

## 2017-01-05 DIAGNOSIS — M6281 Muscle weakness (generalized): Secondary | ICD-10-CM | POA: Diagnosis not present

## 2017-01-05 DIAGNOSIS — R2681 Unsteadiness on feet: Secondary | ICD-10-CM | POA: Diagnosis not present

## 2017-01-05 DIAGNOSIS — R2689 Other abnormalities of gait and mobility: Secondary | ICD-10-CM

## 2017-01-05 MED ORDER — METFORMIN HCL 1000 MG PO TABS: 1000.0000 mg | ORAL_TABLET | Freq: Two times a day (BID) | ORAL | 0 refills | Status: DC

## 2017-01-05 NOTE — Telephone Encounter (Signed)
Pt calling to request refill of:  Name of Medication(s):  Metformin  Last date of OV:  09-03-16 Pharmacy:  [facility]   Will route refill request to Clinic RN.  Discussed with patient policy to call pharmacy for future refills.  Also, discussed refills may take up to 48 hours to approve or deny.  [name]

## 2017-01-05 NOTE — Therapy (Signed)
[facility] [address] [place], [place], [address] Phone: [phone]   Fax:  [phone]  Physical Therapy Treatment  Patient Details  Name: [name] MRN: [id number] Date of Birth: [birth year] Referring Provider: Dr. Alysia Penna  Encounter Date: 01/05/2017      PT End of Session - 01/05/17 0938    Visit Number 11   Number of Visits 16   Date for PT Re-Evaluation 02/17/17  per new 4 week recert 6/76/72   Authorization Type Medicare Traditional primary-GCODE every 10th visit   PT Start Time 0936  pt in bathroom prior to PT session   PT Stop Time 1015   PT Time Calculation (min) 39 min   Equipment Utilized During Treatment Gait belt   Activity Tolerance Patient tolerated treatment well   Behavior During Therapy [facility] for tasks assessed/performed      Past Medical History:  Diagnosis Date  . 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 CVA (cerebral infarction) 01/03/2008   MRI: Acute 1 x 1.5 cm infarction affecting the left side of the pons.  . Diabetes mellitus   . DVT (deep venous thrombosis) ([place])   . Hypertension   . PAD (peripheral artery disease) ([place])   . Stroke [facility])    2008    Past Surgical History:  Procedure Laterality Date  . COLONOSCOPY    . None      There were no vitals filed for this visit.      Subjective Assessment - 01/05/17 0937    Subjective No new complaints. No falls or pain to report. Going to [facility] on Wed to get cane.   Pertinent History CVA 2009, DM, DVT, HTN, PAD, L ACA infarct 06/2016   Patient Stated Goals Pt's goal for therapy is to be able to walk faster.   Currently in Pain? No/denies   Pain Score 0-No pain             [facility] Adult PT Treatment/Exercise - 01/05/17 0939      Transfers   Transfers Sit to Stand;Stand to Sit   Sit to Stand 6: Modified independent (Device/Increase time)   Stand to Sit 6: Modified independent (Device/Increase time)      Ambulation/Gait   Ambulation/Gait Yes   Ambulation/Gait Assistance 4: Min guard;5: Supervision   Ambulation Distance (Feet) 230 Feet  x1, 310 x1 in/outdoors   Assistive device Small based quad cane   Gait Pattern Step-through pattern;Decreased weight shift to right;Decreased dorsiflexion - right;Decreased step length - right;Decreased stance time - right   Ambulation Surface Level;Indoor;Unlevel;Outdoor;Paved   Ramp 5: Supervision;Other (comment)  min guard assist with 1st rep   Ramp Details (indicate cue type and reason) x 2 reps with min guard assist on 1st rep due to part of quad cane was hanging off edge of ramp, improved placement on 2cd rep with only supervision needed.   Curb 5: Supervision;Other (comment)  min guard assist x1 rep, supervision x1   Curb Details (indicate cue type and reason) outdoor curb x 2 reps with no cues needed on sequencing   Gait Comments 50 feet x 4 laps with red mat<>floor<>blue mat<>floor with small based quad cane with min guard assist for balance                                    PT Short Term Goals - 12/01/16 1504  PT SHORT TERM GOAL #1   Title The patient will return demo HEP with written cues.   Baseline Target date 12/02/2015   Time 4   Period Weeks   Status On-going     PT SHORT TERM GOAL #2   Title The patient will improve Berg from 27/56 to > or equal to 33/56 to demo dec'ing risk for falls.   Baseline Target date 12/02/2015   Time 4   Period Weeks   Status On-going     PT SHORT TERM GOAL #3   Title The patient will improve gait speed from 1.02 ft/sec to > or equal to 1.3 ft/sec to transition to "limited community ambulator"   Baseline Patient scores 1.04 ft/sec on 11/26/2015 with cues to move at a faster pace.   Time 4   Period Weeks   Status Partially Met     PT SHORT TERM GOAL #4   Title The patient will negotiate 4 steps with step to pattern and one handrail with supervision    Baseline Met on 11/26/2015   Time 4    Period Weeks   Status Achieved           PT Long Term Goals - 12/18/16 0849      PT LONG TERM GOAL #1   Title Pt will be independent with HEP for improved balance, strength, and gait.  TARGET 12/05/16-EXTENDED x 1 week to be assessed by 12/12/16-AWM (1 week remains in POC)   Baseline per patient report   Time 4   Period Weeks   Status Achieved     PT LONG TERM GOAL #2   Title Pt will improve Berg Balance score to at least 41/56 for decreased fall risk.   Baseline 43/56 12/18/16   Time 4   Period Weeks   Status Achieved     PT LONG TERM GOAL #3   Title Pt will improve TUG score to less than or equal to 30 seconds for decreased fall risk.   Baseline 28.75 sec 12/04/16   Time 4   Period Weeks   Status Achieved     PT LONG TERM GOAL #4   Title Pt will improve gait velocity to at least 1.6 ft/sec for improved gait efficiency and safety.  ONGOING GOAL:  TARGET 01/17/17   Baseline 1.31 ft/sec 12/18/16   Time 4  per recert 2/53/66   Period Weeks   Status On-going     PT LONG TERM GOAL #5   Title Pt will verbalize plans for continued community fitness upon D/C from PT.  ONGOING TARGET 01/17/17   Time 4  per recert 4/40/34   Period Weeks   Status On-going     PT LONG TERM GOAL #6   Title Pt will verbalize understanding of fall prevention in the home environment.  ONGOING GOAL, TARGET 01/17/17   Time 4  per recert 7/42/59   Period Weeks   Status On-going     PT LONG TERM GOAL #7   Title Pt will negotiate outdoor surfaces, including sidewalk, curb, grassy surfaces, using SBQC, modified independently, at least 150 ft, for improved safety with outdoor surface negoitaiton.  TARGET 01/17/17   Time 4  per recert 5/63/87   Period Weeks   Status New               Plan - 01/05/17 0939    Clinical Impression Statement Today's skilled session continued to address gait with small based quad cane on multiple surfaces  and barriers with less overall assistance needed this session vs  previous sessions. Pt is making steady progress toward goals and should benefit from continued PT to progress toward unmet goals.    Rehab Potential Good   PT Frequency 2x / week   PT Duration 4 weeks  Per recert 06/13/71   PT Next Visit Plan Gait training with [facility] outdoor, compliant surfaces -consider adding hip/ankle/step strategies to HEP   Consulted and Agree with Plan of Care Patient      Patient will benefit from skilled therapeutic intervention in order to improve the following deficits and impairments:  Abnormal gait, Decreased balance, Decreased mobility, Difficulty walking, Decreased strength, Postural dysfunction, Impaired tone  Visit Diagnosis: Other abnormalities of gait and mobility  Unsteadiness on feet  Muscle weakness (generalized)     Problem List Patient Active Problem List   Diagnosis Date Noted  . Dysarthria as late effect of stroke 08/18/2016  . Stage 2 chronic kidney disease   . Leukocytosis   . Benign essential HTN   . Spastic hemiparesis ([place])   . Aphasia due to recent cerebral infarction 07/04/2016  . Cerebrovascular accident (CVA) ([place])   . Altered mental status 06/30/2016  . Fall 06/30/2016  . Acute encephalopathy 06/30/2016  . Sepsis ([place]) 06/30/2016  . Diarrhea 06/30/2016  . Acute renal failure superimposed on stage 2 chronic kidney disease ([place]) 06/30/2016  . Hemiparesis affecting right side as late effect of stroke ([place]) 10/23/2015  . PAD (peripheral artery disease) ([place]) 10/20/2011  . Hyperlipidemia 02/21/2010  . CEREBROVASCULAR ACCIDENT, HX OF 12/22/2008  . Diabetes mellitus, type II ([place]) 07/30/2006  . OBESITY, NOS 07/30/2006  . Essential hypertension, benign 07/30/2006    [name], PTA, [facility] [address], [place], [place] [address] [phone] 01/05/17, 12:28 PM   Name: [name] MRN: [id number] Date of Birth: 1951/01/28

## 2017-01-08 ENCOUNTER — Ambulatory Visit: Payer: Medicare Other | Admitting: Physical Therapy

## 2017-01-08 ENCOUNTER — Encounter: Payer: Self-pay | Admitting: Physical Therapy

## 2017-01-08 DIAGNOSIS — R2681 Unsteadiness on feet: Secondary | ICD-10-CM | POA: Diagnosis not present

## 2017-01-08 DIAGNOSIS — R2689 Other abnormalities of gait and mobility: Secondary | ICD-10-CM

## 2017-01-08 DIAGNOSIS — M6281 Muscle weakness (generalized): Secondary | ICD-10-CM | POA: Diagnosis not present

## 2017-01-11 NOTE — Therapy (Signed)
[facility] [address] [place], [place], [address] Phone: [phone]   Fax:  [phone]  Physical Therapy Treatment  Patient Details  Name: SHANNEN VERNON MRN: [id number] Date of Birth: 09/22/1950 Referring Provider: Dr. Alysia Penna  Encounter Date: 01/08/2017   01/08/17 0938  PT Visits / Re-Eval  Visit Number 12  Number of Visits 16  Date for PT Re-Evaluation 02/17/17  Authorization  Authorization Type Medicare Traditional primary-GCODE every 10th visit  PT Time Calculation  PT Start Time 0935  PT Stop Time 1015  PT Time Calculation (min) 40 min  PT - End of Session  Equipment Utilized During Treatment Gait belt  Activity Tolerance Patient tolerated treatment well  Behavior During Therapy [facility] for tasks assessed/performed     Past Medical History:  Diagnosis Date  . 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 CVA (cerebral infarction) 01/03/2008   MRI: Acute 1 x 1.5 cm infarction affecting the left side of the pons.  . Diabetes mellitus   . DVT (deep venous thrombosis) ([place])   . Hypertension   . PAD (peripheral artery disease) ([place])   . Stroke [facility])    2008    Past Surgical History:  Procedure Laterality Date  . COLONOSCOPY    . None      There were no vitals filed for this visit.     01/08/17 0938  Symptoms/Limitations  Subjective No new complaints. No falls or pain to report. To clinic with new small based quad cane.   Pertinent History CVA 2009, DM, DVT, HTN, PAD, L ACA infarct 06/2016  Patient Stated Goals Pt's goal for therapy is to be able to walk faster.  Pain Assessment  Currently in Pain? No/denies  Pain Score 0      01/08/17 0940  Transfers  Transfers Sit to Stand;Stand to Sit  Sit to Stand 6: Modified independent (Device/Increase time)  Stand to Sit 6: Modified independent (Device/Increase time)  Ambulation/Gait  Ambulation/Gait Yes  Ambulation/Gait Assistance  5: Supervision  Ambulation/Gait Assistance Details occasional cues on posture. no balance loss noted on outdoor surfaces  Ambulation Distance (Feet) 230 Feet (x1, 350 x1 in/outdoors)  Assistive device Small based quad cane  Gait Pattern Step-through pattern;Decreased weight shift to right;Decreased dorsiflexion - right;Decreased step length - right;Decreased stance time - right  Ambulation Surface Level;Indoor;Unlevel;Outdoor;Paved  Ramp Other (comment) (min gaurd assist)  Ramp Details (indicate cue type and reason) x1 rep on indoor ramp with [facility] with min guard assist, supervision on outdoor inclines/declines  Curb 5: Supervision  Curb Details (indicate cue type and reason) x1 rep with outdoor curb using [facility]      01/08/17 1003  Balance Exercises: Standing  Standing Eyes Opened Narrow base of support (BOS);Head turns;Foam/compliant surface;Other reps (comment);30 secs;Limitations  Rockerboard Anterior/posterior;EO;10 reps;UE support (single left UE support on parallel bars)  Balance Exercises: Standing  Rebounder Limitations on rocker board EO rocking board with emphasis on tall posture and weight shifting  Standing Eyes Closed Limitations on airex in parallel bars, no UE support with feet together: EC no head movements, progressing to EC head movements left<>right, up<>down and diagonals both ways. min assist for balance with cues on posture and weight shifting to assist with balance recovery.           PT Short Term Goals - 12/01/16 1504      PT SHORT TERM GOAL #1   Title The patient will return demo HEP with written cues.  Baseline Target date 12/02/2015   Time 4   Period Weeks   Status On-going     PT SHORT TERM GOAL #2   Title The patient will improve Berg from 27/56 to > or equal to 33/56 to demo dec'ing risk for falls.   Baseline Target date 12/02/2015   Time 4   Period Weeks   Status On-going     PT SHORT TERM GOAL #3   Title The patient will improve gait speed  from 1.02 ft/sec to > or equal to 1.3 ft/sec to transition to "limited community ambulator"   Baseline Patient scores 1.04 ft/sec on 11/26/2015 with cues to move at a faster pace.   Time 4   Period Weeks   Status Partially Met     PT SHORT TERM GOAL #4   Title The patient will negotiate 4 steps with step to pattern and one handrail with supervision    Baseline Met on 11/26/2015   Time 4   Period Weeks   Status Achieved           PT Long Term Goals - 12/18/16 0849      PT LONG TERM GOAL #1   Title Pt will be independent with HEP for improved balance, strength, and gait.  TARGET 12/05/16-EXTENDED x 1 week to be assessed by 12/12/16-AWM (1 week remains in POC)   Baseline per patient report   Time 4   Period Weeks   Status Achieved     PT LONG TERM GOAL #2   Title Pt will improve Berg Balance score to at least 41/56 for decreased fall risk.   Baseline 43/56 12/18/16   Time 4   Period Weeks   Status Achieved     PT LONG TERM GOAL #3   Title Pt will improve TUG score to less than or equal to 30 seconds for decreased fall risk.   Baseline 28.75 sec 12/04/16   Time 4   Period Weeks   Status Achieved     PT LONG TERM GOAL #4   Title Pt will improve gait velocity to at least 1.6 ft/sec for improved gait efficiency and safety.  ONGOING GOAL:  TARGET 01/17/17   Baseline 1.31 ft/sec 12/18/16   Time 4  per recert 1/74/08   Period Weeks   Status On-going     PT LONG TERM GOAL #5   Title Pt will verbalize plans for continued community fitness upon D/C from PT.  ONGOING TARGET 01/17/17   Time 4  per recert 1/44/81   Period Weeks   Status On-going     PT LONG TERM GOAL #6   Title Pt will verbalize understanding of fall prevention in the home environment.  ONGOING GOAL, TARGET 01/17/17   Time 4  per recert 8/56/31   Period Weeks   Status On-going     PT LONG TERM GOAL #7   Title Pt will negotiate outdoor surfaces, including sidewalk, curb, grassy surfaces, using SBQC, modified  independently, at least 150 ft, for improved safety with outdoor surface negoitaiton.  TARGET 01/17/17   Time 4  per recert 4/97/02   Period Weeks   Status New       01/08/17 0939  Plan  Clinical Impression Statement Today's skilled session contineud to focus on gait with small based quad caned and balance activities. No issues reported with session. Pt is progressing toward goals and should benefit from continued PT to progress toward unmet goals.   Pt will benefit from  skilled therapeutic intervention in order to improve on the following deficits Abnormal gait;Decreased balance;Decreased mobility;Difficulty walking;Decreased strength;Postural dysfunction;Impaired tone  Rehab Potential Good  PT Frequency 2x / week  PT Duration 4 weeks (Per recert 11/23/90)  PT Next Visit Plan Gait training with [facility] outdoor, compliant surfaces, continue to work on balance with emphasis on hip/ankle/step strategies, add to HEP as needed  Consulted and Agree with Plan of Care Patient       Patient will benefit from skilled therapeutic intervention in order to improve the following deficits and impairments:  Abnormal gait, Decreased balance, Decreased mobility, Difficulty walking, Decreased strength, Postural dysfunction, Impaired tone  Visit Diagnosis: Other abnormalities of gait and mobility  Unsteadiness on feet     Problem List Patient Active Problem List   Diagnosis Date Noted  . Dysarthria as late effect of stroke 08/18/2016  . Stage 2 chronic kidney disease   . Leukocytosis   . Benign essential HTN   . Spastic hemiparesis ([place])   . Aphasia due to recent cerebral infarction 07/04/2016  . Cerebrovascular accident (CVA) ([place])   . Altered mental status 06/30/2016  . Fall 06/30/2016  . Acute encephalopathy 06/30/2016  . Sepsis ([place]) 06/30/2016  . Diarrhea 06/30/2016  . Acute renal failure superimposed on stage 2 chronic kidney disease ([place]) 06/30/2016  . Hemiparesis affecting right side  as late effect of stroke ([place]) 10/23/2015  . PAD (peripheral artery disease) ([place]) 10/20/2011  . Hyperlipidemia 02/21/2010  . CEREBROVASCULAR ACCIDENT, HX OF 12/22/2008  . Diabetes mellitus, type II ([place]) 07/30/2006  . OBESITY, NOS 07/30/2006  . Essential hypertension, benign 07/30/2006    [name], PTA, [facility] [address] [place], [place] [address] [phone] 01/11/17, 5:36 PM   Name: COLEN ELTZROTH MRN: [id number] Date of Birth: Jan 26, 1951

## 2017-01-12 ENCOUNTER — Ambulatory Visit: Payer: Medicare Other | Admitting: Physical Therapy

## 2017-01-12 DIAGNOSIS — R2689 Other abnormalities of gait and mobility: Secondary | ICD-10-CM | POA: Diagnosis not present

## 2017-01-12 DIAGNOSIS — R2681 Unsteadiness on feet: Secondary | ICD-10-CM | POA: Diagnosis not present

## 2017-01-12 DIAGNOSIS — M6281 Muscle weakness (generalized): Secondary | ICD-10-CM | POA: Diagnosis not present

## 2017-01-12 NOTE — Therapy (Signed)
[facility] [address] [place], [place], [address] Phone: [phone]   Fax:  [phone]  Physical Therapy Treatment  Patient Details  Name: [name] MRN: [id number] Date of Birth: [birth year] Referring Provider: Dr. Alysia Penna  Encounter Date: 01/12/2017      PT End of Session - 01/12/17 1217    Visit Number 13   Number of Visits 16   Date for PT Re-Evaluation 02/17/17   Authorization Type Medicare Traditional primary-GCODE every 10th visit   PT Start Time 0934   PT Stop Time 1014   PT Time Calculation (min) 40 min   Equipment Utilized During Treatment Gait belt   Activity Tolerance Patient tolerated treatment well   Behavior During Therapy [facility] for tasks assessed/performed      Past Medical History:  Diagnosis Date  . 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 CVA (cerebral infarction) 01/03/2008   MRI: Acute 1 x 1.5 cm infarction affecting the left side of the pons.  . Diabetes mellitus   . DVT (deep venous thrombosis) ([place])   . Hypertension   . PAD (peripheral artery disease) ([place])   . Stroke [facility])    2008    Past Surgical History:  Procedure Laterality Date  . COLONOSCOPY    . None      There were no vitals filed for this visit.      Subjective Assessment - 01/12/17 0933    Subjective Doing okay today.  Tired, because I walked around [employer] yesterday evening.  "[employer] is a big place."   Pertinent History CVA 2009, DM, DVT, HTN, PAD, L ACA infarct 06/2016   Patient Stated Goals Pt's goal for therapy is to be able to walk faster.   Currently in Pain? No/denies                         [facility] Adult PT Treatment/Exercise - 01/12/17 0934      Ambulation/Gait   Ambulation/Gait Yes   Ambulation/Gait Assistance 5: Supervision;6: Modified independent (Device/Increase time)   Ambulation Distance (Feet) 510 Feet  indoors/outdoors, then 60 ft x 2   Assistive  device Small based quad cane   Gait Pattern Step-through pattern;Decreased weight shift to right;Decreased dorsiflexion - right;Decreased step length - right;Decreased stance time - right   Ambulation Surface Level;Indoor;Unlevel;Outdoor;Paved   Gait velocity 27.22 sec = 1.22 ft/sec   Ramp Other (comment);5: Supervision  min guard   Ramp Details (indicate cue type and reason) x 4 reps outdoor incline/decline-good cane placement and pacing with gait on ramp   Curb --  Min guard indoor curb x 2 reps   Curb Details (indicate cue type and reason) Practiced on aerobic step, using SBQC, x 2 reps, with supervision     Self-Care   Self-Care Other Self-Care Comments   Other Self-Care Comments  After outdoor gait, pt feels his blood sugar may be low (and he sits and eats cookies/drinks water he brought from home).  During seated break, discussed pt's return to [facility], and need to be prepared for possible fluctuations in blood sugar due to increased exercise (pt is aware).  Discussed options for exercise at [facility] and plans for d/c next week.  Discussed monitoring overall activity level and try to avoid overdoing it (as in walking around [employer] too far, as he often is stiffer on days after he walks longer distances  PT Education - 01/12/17 1216    Education provided Yes   Education Details See self-care; return to [facility] after planned discharge next visit   Person(s) Educated Patient   Methods Explanation   Comprehension Verbalized understanding          PT Short Term Goals - 12/01/16 1504      PT SHORT TERM GOAL #1   Title The patient will return demo HEP with written cues.   Baseline Target date 12/02/2015   Time 4   Period Weeks   Status On-going     PT SHORT TERM GOAL #2   Title The patient will improve Berg from 27/56 to > or equal to 33/56 to demo dec'ing risk for falls.   Baseline Target date 12/02/2015   Time 4   Period Weeks   Status  On-going     PT SHORT TERM GOAL #3   Title The patient will improve gait speed from 1.02 ft/sec to > or equal to 1.3 ft/sec to transition to "limited community ambulator"   Baseline Patient scores 1.04 ft/sec on 11/26/2015 with cues to move at a faster pace.   Time 4   Period Weeks   Status Partially Met     PT SHORT TERM GOAL #4   Title The patient will negotiate 4 steps with step to pattern and one handrail with supervision    Baseline Met on 11/26/2015   Time 4   Period Weeks   Status Achieved           PT Long Term Goals - 01/12/17 1218      PT LONG TERM GOAL #1   Title Pt will be independent with HEP for improved balance, strength, and gait.  TARGET 12/05/16-EXTENDED x 1 week to be assessed by 12/12/16-AWM (1 week remains in POC)   Baseline per patient report   Time 4   Period Weeks   Status Achieved     PT LONG TERM GOAL #2   Title Pt will improve Berg Balance score to at least 41/56 for decreased fall risk.   Baseline 43/56 12/18/16   Time 4   Period Weeks   Status Achieved     PT LONG TERM GOAL #3   Title Pt will improve TUG score to less than or equal to 30 seconds for decreased fall risk.   Baseline 28.75 sec 12/04/16   Time 4   Period Weeks   Status Achieved     PT LONG TERM GOAL #4   Title Pt will improve gait velocity to at least 1.6 ft/sec for improved gait efficiency and safety.  ONGOING GOAL:  TARGET 01/17/17   Baseline 1.31 ft/sec 12/18/16   Time 4  per recert 08/15/38   Period Weeks   Status On-going     PT LONG TERM GOAL #5   Title Pt will verbalize plans for continued community fitness upon D/C from PT.  ONGOING TARGET 01/17/17   Time 4  per recert 0/86/76   Period Weeks   Status On-going     PT LONG TERM GOAL #6   Title Pt will verbalize understanding of fall prevention in the home environment.  ONGOING GOAL, TARGET 01/17/17   Time 4  per recert 1/95/09   Period Weeks   Status On-going     PT LONG TERM GOAL #7   Title Pt will negotiate  outdoor surfaces, including sidewalk, curb, grassy surfaces, using SBQC, modified independently, at least 150 ft, for improved safety  with outdoor surface negoitaiton.  TARGET 01/17/17   Time 4  per recert 10/04/16   Period Weeks   Status New               Plan - 01/12/17 1218    Clinical Impression Statement Continued gait training activities on outdoor surfaces and curb, ramp.  On indoor curb, pt needs min assist due to R foot catching on step.  Pt feels it's because he is stiff from walking a good deal this morning and yesterday evening.  Pt overall is doing well with SBQC, but encouraged patient to use [facility] if he is going longer distances or is more fatigued with gait.  Pt on track for discharge next visit.   Rehab Potential Good   PT Frequency 2x / week   PT Duration 4 weeks  Per recert 3/35/82   PT Next Visit Plan Plan for discharge next visit; brief trial of leg press and nustep to prepare for return to [facility].   Consulted and Agree with Plan of Care Patient      Patient will benefit from skilled therapeutic intervention in order to improve the following deficits and impairments:  Abnormal gait, Decreased balance, Decreased mobility, Difficulty walking, Decreased strength, Postural dysfunction, Impaired tone  Visit Diagnosis: Other abnormalities of gait and mobility  Unsteadiness on feet     Problem List Patient Active Problem List   Diagnosis Date Noted  . Dysarthria as late effect of stroke 08/18/2016  . Stage 2 chronic kidney disease   . Leukocytosis   . Benign essential HTN   . Spastic hemiparesis ([place])   . Aphasia due to recent cerebral infarction 07/04/2016  . Cerebrovascular accident (CVA) ([place])   . Altered mental status 06/30/2016  . Fall 06/30/2016  . Acute encephalopathy 06/30/2016  . Sepsis ([place]) 06/30/2016  . Diarrhea 06/30/2016  . Acute renal failure superimposed on stage 2 chronic kidney disease ([place]) 06/30/2016  . Hemiparesis affecting  right side as late effect of stroke ([place]) 10/23/2015  . PAD (peripheral artery disease) ([place]) 10/20/2011  . Hyperlipidemia 02/21/2010  . CEREBROVASCULAR ACCIDENT, HX OF 12/22/2008  . Diabetes mellitus, type II ([place]) 07/30/2006  . OBESITY, NOS 07/30/2006  . Essential hypertension, benign 07/30/2006    Frazier Butt. 01/12/2017, 12:22 PM  Frazier Butt., PT  [facility] [address] [place], [place], [address] Phone: [phone]   Fax:  [phone]  Name: [name] MRN: [id number] Date of Birth: [birth year]

## 2017-01-15 ENCOUNTER — Ambulatory Visit: Payer: Medicare Other | Admitting: Physical Therapy

## 2017-01-15 DIAGNOSIS — M6281 Muscle weakness (generalized): Secondary | ICD-10-CM | POA: Diagnosis not present

## 2017-01-15 DIAGNOSIS — R2681 Unsteadiness on feet: Secondary | ICD-10-CM | POA: Diagnosis not present

## 2017-01-15 DIAGNOSIS — R2689 Other abnormalities of gait and mobility: Secondary | ICD-10-CM

## 2017-01-15 NOTE — Therapy (Signed)
[facility] [address] [place], [place], [address] Phone: [phone]   Fax:  [phone]  Physical Therapy Treatment  Patient Details  Name: [name] MRN: [id number] Date of Birth: August 21, 1950 Referring Provider: Dr. Alysia Penna  Encounter Date: 01/15/2017      PT End of Session - 01/15/17 1923    Visit Number 14   Number of Visits 16   Date for PT Re-Evaluation 02/17/17   Authorization Type Medicare Traditional primary-GCODE every 10th visit   PT Start Time 0934   PT Stop Time 1014   PT Time Calculation (min) 40 min   Equipment Utilized During Treatment Gait belt   Activity Tolerance Patient tolerated treatment well   Behavior During Therapy [facility] for tasks assessed/performed      Past Medical History:  Diagnosis Date  . 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 CVA (cerebral infarction) 01/03/2008   MRI: Acute 1 x 1.5 cm infarction affecting the left side of the pons.  . Diabetes mellitus   . DVT (deep venous thrombosis) ([place])   . Hypertension   . PAD (peripheral artery disease) ([place])   . Stroke [facility])    2008    Past Surgical History:  Procedure Laterality Date  . COLONOSCOPY    . None      There were no vitals filed for this visit.      Subjective Assessment - 01/15/17 0939    Subjective Nothing new, no changes today.   Pertinent History CVA 2009, DM, DVT, HTN, PAD, L ACA infarct 06/2016   Patient Stated Goals Pt's goal for therapy is to be able to walk faster.   Currently in Pain? No/denies                         [facility] Adult PT Treatment/Exercise - 01/15/17 0001      Ambulation/Gait   Ambulation/Gait Yes   Ambulation/Gait Assistance 6: Modified independent (Device/Increase time)   Ambulation Distance (Feet) 80 Feet  then 75 ft outdoors   Assistive device Small based quad cane   Gait Pattern Step-through pattern;Decreased weight shift to  right;Decreased dorsiflexion - right;Decreased step length - right;Decreased stance time - right   Ambulation Surface Level;Indoor;Unlevel;Outdoor;Paved   Ramp 6: Modified independent (Device)  SBQC outdoors   Ramp Details (indicate cue type and reason) x 2   Curb 5: Supervision  Min guard assist   Curb Details (indicate cue type and reason) Practiced x 3 reps outdoors, cues for foot clearance and approach all the way to edge of curb     Self-Care   Self-Care Other Self-Care Comments   Other Self-Care Comments  Fall prevention education provided     Knee/Hip Exercises: Aerobic   Nustep Level 6, 4 extremities x 5 minutes for leg strengthening  Discussed how he can increase work by changing speed     Knee/Hip Exercises: Machines for Strengthening   Cybex Leg Press bilateral lower extremities, 50# x 10 reps, then 60 # x 10 reps; therapist provides assistance for RLE placement   Other Machine Pt reports machine at [facility] is set up differently and he can get on and off machine independently there.     Discussed how these machines are similar to what he might use at [facility] and how to progress time and resistance.           PT Education - 01/15/17 1922  Education provided Yes   Education Details Fall prevention, progress with goals, plans for d/c   Person(s) Educated Patient   Methods Explanation;Handout   Comprehension Verbalized understanding          PT Short Term Goals - 12/01/16 1504      PT SHORT TERM GOAL #1   Title The patient will return demo HEP with written cues.   Baseline Target date 12/02/2015   Time 4   Period Weeks   Status On-going     PT SHORT TERM GOAL #2   Title The patient will improve Berg from 27/56 to > or equal to 33/56 to demo dec'ing risk for falls.   Baseline Target date 12/02/2015   Time 4   Period Weeks   Status On-going     PT SHORT TERM GOAL #3   Title The patient will improve gait speed from 1.02 ft/sec to > or equal to  1.3 ft/sec to transition to "limited community ambulator"   Baseline Patient scores 1.04 ft/sec on 11/26/2015 with cues to move at a faster pace.   Time 4   Period Weeks   Status Partially Met     PT SHORT TERM GOAL #4   Title The patient will negotiate 4 steps with step to pattern and one handrail with supervision    Baseline Met on 11/26/2015   Time 4   Period Weeks   Status Achieved           PT Long Term Goals - 01/15/17 7654      PT LONG TERM GOAL #1   Title Pt will be independent with HEP for improved balance, strength, and gait.  TARGET 12/05/16-EXTENDED x 1 week to be assessed by 12/12/16-AWM (1 week remains in POC)   Baseline per patient report   Time 4   Period Weeks   Status Achieved     PT LONG TERM GOAL #2   Title Pt will improve Berg Balance score to at least 41/56 for decreased fall risk.   Baseline 43/56 12/18/16   Time 4   Period Weeks   Status Achieved     PT LONG TERM GOAL #3   Title Pt will improve TUG score to less than or equal to 30 seconds for decreased fall risk.   Baseline 28.75 sec 12/04/16   Time 4   Period Weeks   Status Achieved     PT LONG TERM GOAL #4   Title Pt will improve gait velocity to at least 1.6 ft/sec for improved gait efficiency and safety.  ONGOING GOAL:  TARGET 01/17/17   Baseline 1.31 ft/sec 12/18/16; 1.22 ft/sec 01/12/17   Time 4  per recert 6/50/35   Period Weeks   Status Not Met     PT LONG TERM GOAL #5   Title Pt will verbalize plans for continued community fitness upon D/C from PT.  ONGOING TARGET 01/17/17   Baseline Plans for starting back to [facility]   Time 4  per recert 4/65/68   Period Weeks   Status Achieved     PT LONG TERM GOAL #6   Title Pt will verbalize understanding of fall prevention in the home environment.  ONGOING GOAL, TARGET 01/17/17   Time 4  per recert 06/28/49   Period Weeks   Status Achieved     PT LONG TERM GOAL #7   Title Pt will negotiate outdoor surfaces, including sidewalk, curb, grassy  surfaces, using SBQC, modified independently, at least 150 ft,  for improved safety with outdoor surface negoitaiton.  TARGET 01/17/17   Baseline supervision for safety   Time 4  per recert 1/79/15   Period Weeks   Status Partially Met               Plan - 2017-02-04 1923    Clinical Impression Statement Checked remaining LTGs today-pt has met LTG 1, 2, 3, 5 and 6.  LTG 4 not met for gait velocity; LTG 7 partially met for distance, but pt requires supervision from therapist for safety (though patient independently ambulates on a daily basis).  Pt is appropriate for discharge this visit.   Rehab Potential Good   PT Frequency 2x / week   PT Duration 4 weeks  Per recert 0/56/97   PT Next Visit Plan Discharge this visit.   Consulted and Agree with Plan of Care Patient      Patient will benefit from skilled therapeutic intervention in order to improve the following deficits and impairments:  Abnormal gait, Decreased balance, Decreased mobility, Difficulty walking, Decreased strength, Postural dysfunction, Impaired tone  Visit Diagnosis: Other abnormalities of gait and mobility  Muscle weakness (generalized)       G-Codes - 04-Feb-2017 1928    Functional Assessment Tool Used (Outpatient Only) gait velocity 1.22 ft/sec with SBQC; TUG 28.72 sec   Functional Limitation Mobility: Walking and moving around   Mobility: Walking and Moving Around Current Status [phone]) At least 20 percent but less than 40 percent impaired, limited or restricted   Mobility: Walking and Moving Around Goal Status [phone]) At least 20 percent but less than 40 percent impaired, limited or restricted      Problem List Patient Active Problem List   Diagnosis Date Noted  . Dysarthria as late effect of stroke 08/18/2016  . Stage 2 chronic kidney disease   . Leukocytosis   . Benign essential HTN   . Spastic hemiparesis ([place])   . Aphasia due to recent cerebral infarction 07/04/2016  . Cerebrovascular accident  (CVA) ([place])   . Altered mental status 06/30/2016  . Fall 06/30/2016  . Acute encephalopathy 06/30/2016  . Sepsis ([place]) 06/30/2016  . Diarrhea 06/30/2016  . Acute renal failure superimposed on stage 2 chronic kidney disease ([place]) 06/30/2016  . Hemiparesis affecting right side as late effect of stroke ([place]) 10/23/2015  . PAD (peripheral artery disease) ([place]) 10/20/2011  . Hyperlipidemia 02/21/2010  . CEREBROVASCULAR ACCIDENT, HX OF 12/22/2008  . Diabetes mellitus, type II ([place]) 07/30/2006  . OBESITY, NOS 07/30/2006  . Essential hypertension, benign 07/30/2006    Jafet Wissing W. February 04, 2017, 7:30 PM  Frazier Butt., PT   [facility] [address] [place], [place], [address] Phone: [phone]   Fax:  [phone]  Name: [name] MRN: [id number] Date of Birth: 05/26/1951   PHYSICAL THERAPY DISCHARGE SUMMARY  Visits from Start of Care: 14  Current functional level related to goals / functional outcomes: See LTGs above   Remaining deficits: Strength, gait   Education / Equipment: Educated in HEP, fall prevention, transition to [facility] for improved gait efficiency and safety, community fitness plans  Plan: Patient agrees to discharge.  Patient goals were not met. Patient is being discharged due to meeting the stated rehab goals.  ?????  Mady Haagensen, PT 04-Feb-2017 7:33 PM Phone: [phone] Fax: [phone]

## 2017-01-15 NOTE — Patient Instructions (Signed)

## 2017-01-16 ENCOUNTER — Encounter: Payer: Medicare Other | Attending: Physical Medicine & Rehabilitation

## 2017-01-16 ENCOUNTER — Encounter: Payer: Self-pay | Admitting: Physical Medicine & Rehabilitation

## 2017-01-16 ENCOUNTER — Ambulatory Visit (HOSPITAL_BASED_OUTPATIENT_CLINIC_OR_DEPARTMENT_OTHER): Payer: Medicare Other | Admitting: Physical Medicine & Rehabilitation

## 2017-01-16 VITALS — BP 130/74 | HR 62 | Resp 14

## 2017-01-16 DIAGNOSIS — T8611 Kidney transplant rejection: Secondary | ICD-10-CM | POA: Diagnosis not present

## 2017-01-16 DIAGNOSIS — I129 Hypertensive chronic kidney disease with stage 1 through stage 4 chronic kidney disease, or unspecified chronic kidney disease: Secondary | ICD-10-CM | POA: Insufficient documentation

## 2017-01-16 DIAGNOSIS — Z8249 Family history of ischemic heart disease and other diseases of the circulatory system: Secondary | ICD-10-CM | POA: Insufficient documentation

## 2017-01-16 DIAGNOSIS — I69322 Dysarthria following cerebral infarction: Secondary | ICD-10-CM | POA: Insufficient documentation

## 2017-01-16 DIAGNOSIS — Z794 Long term (current) use of insulin: Secondary | ICD-10-CM | POA: Diagnosis not present

## 2017-01-16 DIAGNOSIS — E1122 Type 2 diabetes mellitus with diabetic chronic kidney disease: Secondary | ICD-10-CM | POA: Diagnosis not present

## 2017-01-16 DIAGNOSIS — G8111 Spastic hemiplegia affecting right dominant side: Secondary | ICD-10-CM | POA: Diagnosis not present

## 2017-01-16 DIAGNOSIS — N172 Acute kidney failure with medullary necrosis: Secondary | ICD-10-CM | POA: Diagnosis not present

## 2017-01-16 DIAGNOSIS — N189 Chronic kidney disease, unspecified: Secondary | ICD-10-CM | POA: Insufficient documentation

## 2017-01-16 DIAGNOSIS — Z833 Family history of diabetes mellitus: Secondary | ICD-10-CM | POA: Insufficient documentation

## 2017-01-16 DIAGNOSIS — G8110 Spastic hemiplegia affecting unspecified side: Secondary | ICD-10-CM | POA: Diagnosis not present

## 2017-01-16 DIAGNOSIS — I739 Peripheral vascular disease, unspecified: Secondary | ICD-10-CM | POA: Diagnosis not present

## 2017-01-16 DIAGNOSIS — Z86718 Personal history of other venous thrombosis and embolism: Secondary | ICD-10-CM | POA: Diagnosis not present

## 2017-01-16 NOTE — Patient Instructions (Addendum)
You received a Dysport injection today. You may experience muscle pains and aches. He may apply ice 20 minutes every 2 hours as needed for the next 24-48 hours. He also noticed bleeding or bruising in the areas that were injected. May apply Band-Aid. If this bruising is extensive, please notify our office. If there is evidence of increasing redness that occurs 2-3 days after injection. Please call our office. This could be a sign of infection. It is very rare, however. You may experience some muscle weakness in the muscles injected. This would likely start in about one week.

## 2017-01-16 NOTE — Progress Notes (Signed)
Dysport Injection for spasticity using needle EMG guidance  Dilution: 200 Units/ml Indication: Severe spasticity which interferes with ADL,mobility and/or  hygiene and is unresponsive to medication management and other conservative care Informed consent was obtained after describing risks and benefits of the procedure with the patient. This includes bleeding, bruising, infection, excessive weakness, or medication side effects. A REMS form is on file and signed. Needle:  needle electrode Number of units per muscle Biceps 300 units FCR, 200 units. FDS 200 units. FDP 100 Pronator teres 100 units. PQ, 100 units All injections were done after obtaining appropriate EMG activity and after negative drawback for blood. The patient tolerated the procedure well. Post procedure instructions were given. A followup appointment was made.  

## 2017-01-30 ENCOUNTER — Other Ambulatory Visit: Payer: Self-pay | Admitting: *Deleted

## 2017-01-30 ENCOUNTER — Other Ambulatory Visit: Payer: Self-pay | Admitting: Nurse Practitioner

## 2017-01-30 MED ORDER — SERTRALINE HCL 100 MG PO TABS: 100.0000 mg | ORAL_TABLET | Freq: Every day | ORAL | 1 refills | Status: DC

## 2017-02-06 ENCOUNTER — Other Ambulatory Visit: Payer: Self-pay | Admitting: *Deleted

## 2017-02-06 NOTE — Telephone Encounter (Signed)
Patient calling to request refill of his metformin.  Will route request to PCP.  Burna Forts, BSN, RN-BC

## 2017-02-08 MED ORDER — METFORMIN HCL 1000 MG PO TABS: 1000.0000 mg | ORAL_TABLET | Freq: Two times a day (BID) | ORAL | 0 refills | Status: DC

## 2017-02-10 ENCOUNTER — Other Ambulatory Visit: Payer: Self-pay | Admitting: Internal Medicine

## 2017-02-10 MED ORDER — METFORMIN HCL 1000 MG PO TABS: 1000.0000 mg | ORAL_TABLET | Freq: Two times a day (BID) | ORAL | 0 refills | Status: DC

## 2017-02-10 NOTE — Telephone Encounter (Signed)
Metformin Rx needs [facility] [address] [facility]

## 2017-02-11 ENCOUNTER — Telehealth: Payer: Self-pay | Admitting: Internal Medicine

## 2017-02-11 NOTE — Telephone Encounter (Signed)
Pt is calling because the pharmacy never received his refill on Metformin. Can we get this called in. In Epic it shows that we hit print yesterday 02/10/17. [facility] [address] jw

## 2017-02-11 NOTE — Telephone Encounter (Signed)
Metformin 1000 mg #60 with NR called to natalie at [facility]. Hubbard Hartshorn, RN, BSN

## 2017-02-27 ENCOUNTER — Ambulatory Visit (HOSPITAL_BASED_OUTPATIENT_CLINIC_OR_DEPARTMENT_OTHER): Payer: Medicare Other | Admitting: Physical Medicine & Rehabilitation

## 2017-02-27 ENCOUNTER — Encounter: Payer: Self-pay | Admitting: Physical Medicine & Rehabilitation

## 2017-02-27 ENCOUNTER — Encounter: Payer: Medicare Other | Attending: Physical Medicine & Rehabilitation

## 2017-02-27 VITALS — BP 145/78 | HR 63

## 2017-02-27 DIAGNOSIS — T8611 Kidney transplant rejection: Secondary | ICD-10-CM | POA: Insufficient documentation

## 2017-02-27 DIAGNOSIS — N189 Chronic kidney disease, unspecified: Secondary | ICD-10-CM | POA: Diagnosis not present

## 2017-02-27 DIAGNOSIS — I69351 Hemiplegia and hemiparesis following cerebral infarction affecting right dominant side: Secondary | ICD-10-CM

## 2017-02-27 DIAGNOSIS — I639 Cerebral infarction, unspecified: Secondary | ICD-10-CM | POA: Diagnosis not present

## 2017-02-27 DIAGNOSIS — I69322 Dysarthria following cerebral infarction: Secondary | ICD-10-CM | POA: Diagnosis not present

## 2017-02-27 DIAGNOSIS — E1122 Type 2 diabetes mellitus with diabetic chronic kidney disease: Secondary | ICD-10-CM | POA: Insufficient documentation

## 2017-02-27 DIAGNOSIS — Z8249 Family history of ischemic heart disease and other diseases of the circulatory system: Secondary | ICD-10-CM | POA: Insufficient documentation

## 2017-02-27 DIAGNOSIS — Z86718 Personal history of other venous thrombosis and embolism: Secondary | ICD-10-CM | POA: Insufficient documentation

## 2017-02-27 DIAGNOSIS — G8111 Spastic hemiplegia affecting right dominant side: Secondary | ICD-10-CM | POA: Diagnosis not present

## 2017-02-27 DIAGNOSIS — Z794 Long term (current) use of insulin: Secondary | ICD-10-CM | POA: Insufficient documentation

## 2017-02-27 DIAGNOSIS — N172 Acute kidney failure with medullary necrosis: Secondary | ICD-10-CM | POA: Diagnosis not present

## 2017-02-27 DIAGNOSIS — Z833 Family history of diabetes mellitus: Secondary | ICD-10-CM | POA: Diagnosis not present

## 2017-02-27 DIAGNOSIS — I129 Hypertensive chronic kidney disease with stage 1 through stage 4 chronic kidney disease, or unspecified chronic kidney disease: Secondary | ICD-10-CM | POA: Diagnosis not present

## 2017-02-27 DIAGNOSIS — I739 Peripheral vascular disease, unspecified: Secondary | ICD-10-CM | POA: Diagnosis not present

## 2017-02-27 NOTE — Patient Instructions (Signed)
REMEMBER TO WEAR RIGHT RESTING HAND SPLINT AT LEAST 8HRS PER DAY

## 2017-02-27 NOTE — Progress Notes (Signed)
Subjective:    Patient ID: [name], male    DOB: 29-Jan-1951, 66 y.o.   MRN: [id number] 66 year old male with left pontine infarct. 2009 with resultant chronic right spastic hemiplegia HPI 01/16/2017 Dysport Number of units per muscle Biceps 300 units FCR, 200 units. FDS 200 units. FDP 100 Pronator teres 100 units. PQ, 100 units  Pt has not done stretching of RUE Discussed using Resting hand splint, Which the patient has at home but has forgotten to use. Pain Inventory Average Pain 0 Pain Right Now 0 My pain is na  In the last 24 hours, has pain interfered with the following? General activity 0 Relation with others 0 Enjoyment of life 0 What TIME of day is your pain at its worst? na Sleep (in general) na  Pain is worse with: na Pain improves with: na Relief from Meds: na  Mobility use a cane ability to climb steps?  yes do you drive?  no  Function disabled: date disabled 2018  Neuro/Psych No problems in this area  Prior Studies Any changes since last visit?  no  Physicians involved in your care Any changes since last visit?  no   Family History  Problem Relation Age of Onset  . Diabetes Mother   . Heart attack Mother   . Hypertension Mother   . Diabetes Sister   . Hypertension Sister   . Stroke Sister        2017  . Hypertension Brother   . Hypertension Daughter   . Hypertension Son   . Colon cancer Neg Hx   . Esophageal cancer Neg Hx   . Stomach cancer Neg Hx   . Rectal cancer Neg Hx    Social History   Social History  . Marital status: Divorced    Spouse name: N/A  . Number of children: 3  . Years of education: 14   Occupational History  . Disabled-security guard [place] History Main Topics  . Smoking status: Never Smoker  . Smokeless tobacco: Never Used  . Alcohol use No  . Drug use: No  . Sexual activity: No   Other Topics Concern  . Not on file   Social History Narrative   Divorced, lives alone    2 sons, 1 daughter   Retired/disabled   No caffeine   12/24/2015      Past Surgical History:  Procedure Laterality Date  . COLONOSCOPY    . None     Past Medical History:  Diagnosis Date  . 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 CVA (cerebral infarction) 01/03/2008   MRI: Acute 1 x 1.5 cm infarction affecting the left side of the pons.  . Diabetes mellitus   . DVT (deep venous thrombosis) ([place])   . Hypertension   . PAD (peripheral artery disease) ([place])   . Stroke [facility])    2008   There were no vitals taken for this visit.  Opioid Risk Score:   Fall Risk Score:  `1  Depression screen PHQ 2/9  Depression screen [facility] 2/9 11/30/2015 11/05/2015 10/23/2015 09/25/2015 05/02/2014 05/09/2013  Decreased Interest 0 0 0 0 0 0  Down, Depressed, Hopeless 0 0 0 0 - 0  PHQ - 2 Score 0 0 0 0 0 0     Review of Systems  Constitutional: Negative.   HENT: Negative.   Eyes: Negative.   Respiratory: Negative.   Cardiovascular: Negative.   Gastrointestinal: Negative.   Endocrine: Negative.  Genitourinary: Negative.   Musculoskeletal: Negative.   Skin: Negative.   Allergic/Immunologic: Negative.   Neurological: Negative.   Hematological: Negative.   Psychiatric/Behavioral: Negative.   All other systems reviewed and are negative.      Objective:   Physical Exam  Constitutional: He appears well-developed and well-nourished.  HENT:  Head: Normocephalic and atraumatic.  Eyes: Pupils are equal, round, and reactive to light. Conjunctivae and EOM are normal.  Nursing note and vitals reviewed.   Modified [place] 2 at the finger flexors, 1 at the wrist flexor and 2 at the biceps Motor strength is 3 minus at the elbow flexor and finger flexor, 1 at the finger extensors Ambulates with a double metal upright AFO and a cane.     Assessment & Plan:  1. Right spastic hemiplegia has had good results with Dysport injection We discussed the usual duration of effect and need for  repeat injection We also discussed the importance of wearing left resting hand splint  Next Dysport injection on or after November 17 Biceps 300 units FCR, 200 units. FDS 200 units. FDP 100 Pronator teres 100 units. PQ, 100 units

## 2017-03-12 ENCOUNTER — Other Ambulatory Visit: Payer: Self-pay | Admitting: Internal Medicine

## 2017-03-12 MED ORDER — METFORMIN HCL 1000 MG PO TABS
1000.0000 mg | ORAL_TABLET | Freq: Two times a day (BID) | ORAL | 3 refills | Status: DC
Start: 2017-03-12 — End: 2017-03-13

## 2017-03-12 MED ORDER — METFORMIN HCL 1000 MG PO TABS: 1000.0000 mg | ORAL_TABLET | Freq: Two times a day (BID) | ORAL | 3 refills | Status: DC

## 2017-03-12 NOTE — Addendum Note (Signed)
Addended by: [name] Z on: 03/12/2017 03:30 PM   Modules accepted: Orders

## 2017-03-12 NOTE — Addendum Note (Signed)
Addended by: [name] Z on: 03/12/2017 03:31 PM   Modules accepted: Orders

## 2017-03-13 ENCOUNTER — Telehealth: Payer: Self-pay | Admitting: *Deleted

## 2017-03-13 MED ORDER — METFORMIN HCL 1000 MG PO TABS: 1000.0000 mg | ORAL_TABLET | Freq: Two times a day (BID) | ORAL | 3 refills | Status: DC

## 2017-03-13 NOTE — Telephone Encounter (Signed)
Refill was sent to wrong pharmacy and I called in to the correct pharmacy. Katharina Caper, Sindy Mccune D, [place]

## 2017-03-29 DIAGNOSIS — Z23 Encounter for immunization: Secondary | ICD-10-CM | POA: Diagnosis not present

## 2017-04-17 ENCOUNTER — Encounter: Payer: Self-pay | Admitting: Physical Medicine & Rehabilitation

## 2017-04-17 ENCOUNTER — Ambulatory Visit (HOSPITAL_BASED_OUTPATIENT_CLINIC_OR_DEPARTMENT_OTHER): Payer: Medicare Other | Admitting: Physical Medicine & Rehabilitation

## 2017-04-17 ENCOUNTER — Encounter: Payer: Medicare Other | Attending: Physical Medicine & Rehabilitation

## 2017-04-17 VITALS — BP 114/69 | HR 64

## 2017-04-17 DIAGNOSIS — I739 Peripheral vascular disease, unspecified: Secondary | ICD-10-CM | POA: Insufficient documentation

## 2017-04-17 DIAGNOSIS — Z8249 Family history of ischemic heart disease and other diseases of the circulatory system: Secondary | ICD-10-CM | POA: Insufficient documentation

## 2017-04-17 DIAGNOSIS — Z794 Long term (current) use of insulin: Secondary | ICD-10-CM | POA: Insufficient documentation

## 2017-04-17 DIAGNOSIS — I129 Hypertensive chronic kidney disease with stage 1 through stage 4 chronic kidney disease, or unspecified chronic kidney disease: Secondary | ICD-10-CM | POA: Insufficient documentation

## 2017-04-17 DIAGNOSIS — Z86718 Personal history of other venous thrombosis and embolism: Secondary | ICD-10-CM | POA: Insufficient documentation

## 2017-04-17 DIAGNOSIS — Z833 Family history of diabetes mellitus: Secondary | ICD-10-CM | POA: Insufficient documentation

## 2017-04-17 DIAGNOSIS — T8611 Kidney transplant rejection: Secondary | ICD-10-CM | POA: Insufficient documentation

## 2017-04-17 DIAGNOSIS — N189 Chronic kidney disease, unspecified: Secondary | ICD-10-CM | POA: Diagnosis not present

## 2017-04-17 DIAGNOSIS — I69322 Dysarthria following cerebral infarction: Secondary | ICD-10-CM | POA: Diagnosis not present

## 2017-04-17 DIAGNOSIS — I69351 Hemiplegia and hemiparesis following cerebral infarction affecting right dominant side: Secondary | ICD-10-CM

## 2017-04-17 DIAGNOSIS — N172 Acute kidney failure with medullary necrosis: Secondary | ICD-10-CM | POA: Insufficient documentation

## 2017-04-17 DIAGNOSIS — E1122 Type 2 diabetes mellitus with diabetic chronic kidney disease: Secondary | ICD-10-CM | POA: Diagnosis not present

## 2017-04-17 DIAGNOSIS — G8111 Spastic hemiplegia affecting right dominant side: Secondary | ICD-10-CM | POA: Diagnosis not present

## 2017-04-17 NOTE — Patient Instructions (Signed)

## 2017-04-17 NOTE — Progress Notes (Signed)
Dysport Injection for spasticity using needle EMG guidance  Dilution: 200 Units/ml Indication: Severe spasticity which interferes with ADL,mobility and/or  hygiene and is unresponsive to medication management and other conservative care Informed consent was obtained after describing risks and benefits of the procedure with the patient. This includes bleeding, bruising, infection, excessive weakness, or medication side effects. A REMS form is on file and signed. Needle:  needle electrode Number of units per muscle Biceps 300 units FCR, 200 units. FDS 200 units. FDP 100 Pronator teres 100 units. PQ, 100 units All injections were done after obtaining appropriate EMG activity and after negative drawback for blood. The patient tolerated the procedure well. Post procedure instructions were given. A followup appointment was made.

## 2017-04-29 ENCOUNTER — Other Ambulatory Visit: Payer: Self-pay | Admitting: Internal Medicine

## 2017-04-29 MED ORDER — INSULIN DETEMIR 100 UNIT/ML FLEXPEN: 27.0000 [IU] | PEN_INJECTOR | Freq: Two times a day (BID) | SUBCUTANEOUS | 5 refills | Status: DC

## 2017-05-21 ENCOUNTER — Other Ambulatory Visit: Payer: Self-pay | Admitting: Internal Medicine

## 2017-05-29 ENCOUNTER — Ambulatory Visit: Payer: Medicare Other | Admitting: Physical Medicine & Rehabilitation

## 2017-05-29 ENCOUNTER — Ambulatory Visit: Payer: Medicare Other

## 2017-05-29 ENCOUNTER — Telehealth: Payer: Self-pay | Admitting: Internal Medicine

## 2017-05-29 MED ORDER — CHLORTHALIDONE 25 MG PO TABS: 25.0000 mg | ORAL_TABLET | Freq: Every day | ORAL | 3 refills | Status: DC

## 2017-05-29 MED ORDER — AMLODIPINE BESYLATE 10 MG PO TABS: 10.0000 mg | ORAL_TABLET | Freq: Every day | ORAL | 3 refills | Status: DC

## 2017-05-29 MED ORDER — CARVEDILOL 25 MG PO TABS: ORAL_TABLET | ORAL | 3 refills | Status: DC

## 2017-05-29 MED ORDER — ATORVASTATIN CALCIUM 40 MG PO TABS: 40.0000 mg | ORAL_TABLET | Freq: Every day | ORAL | 3 refills | Status: DC

## 2017-05-29 NOTE — Telephone Encounter (Signed)
Refill medications

## 2017-05-29 NOTE — Addendum Note (Signed)
Addended by: [name] Z on: 05/29/2017 02:07 PM   Modules accepted: Orders

## 2017-06-02 ENCOUNTER — Emergency Department (HOSPITAL_COMMUNITY)
Admission: EM | Admit: 2017-06-02 | Discharge: 2017-06-03 | Disposition: A | Discharge status: Home / Self Care | Payer: Medicare HMO | Attending: Emergency Medicine | Admitting: Emergency Medicine

## 2017-06-02 ENCOUNTER — Encounter (HOSPITAL_COMMUNITY): Payer: Self-pay

## 2017-06-02 ENCOUNTER — Emergency Department (HOSPITAL_COMMUNITY): Payer: Medicare HMO

## 2017-06-02 DIAGNOSIS — Z794 Long term (current) use of insulin: Secondary | ICD-10-CM | POA: Insufficient documentation

## 2017-06-02 DIAGNOSIS — S0990XA Unspecified injury of head, initial encounter: Secondary | ICD-10-CM

## 2017-06-02 DIAGNOSIS — N182 Chronic kidney disease, stage 2 (mild): Secondary | ICD-10-CM | POA: Diagnosis not present

## 2017-06-02 DIAGNOSIS — Z79899 Other long term (current) drug therapy: Secondary | ICD-10-CM | POA: Insufficient documentation

## 2017-06-02 DIAGNOSIS — Z8673 Personal history of transient ischemic attack (TIA), and cerebral infarction without residual deficits: Secondary | ICD-10-CM | POA: Diagnosis not present

## 2017-06-02 DIAGNOSIS — Z940 Kidney transplant status: Secondary | ICD-10-CM | POA: Diagnosis not present

## 2017-06-02 DIAGNOSIS — Y998 Other external cause status: Secondary | ICD-10-CM | POA: Diagnosis not present

## 2017-06-02 DIAGNOSIS — R6889 Other general symptoms and signs: Secondary | ICD-10-CM | POA: Diagnosis not present

## 2017-06-02 DIAGNOSIS — I10 Essential (primary) hypertension: Secondary | ICD-10-CM | POA: Diagnosis not present

## 2017-06-02 DIAGNOSIS — G4751 Confusional arousals: Secondary | ICD-10-CM | POA: Diagnosis not present

## 2017-06-02 DIAGNOSIS — Y92013 Bedroom of single-family (private) house as the place of occurrence of the external cause: Secondary | ICD-10-CM | POA: Diagnosis not present

## 2017-06-02 DIAGNOSIS — W06XXXA Fall from bed, initial encounter: Secondary | ICD-10-CM | POA: Diagnosis not present

## 2017-06-02 DIAGNOSIS — W19XXXA Unspecified fall, initial encounter: Secondary | ICD-10-CM

## 2017-06-02 DIAGNOSIS — Y9389 Activity, other specified: Secondary | ICD-10-CM | POA: Diagnosis not present

## 2017-06-02 DIAGNOSIS — I129 Hypertensive chronic kidney disease with stage 1 through stage 4 chronic kidney disease, or unspecified chronic kidney disease: Secondary | ICD-10-CM | POA: Insufficient documentation

## 2017-06-02 DIAGNOSIS — Z7902 Long term (current) use of antithrombotics/antiplatelets: Secondary | ICD-10-CM | POA: Diagnosis not present

## 2017-06-02 DIAGNOSIS — R410 Disorientation, unspecified: Secondary | ICD-10-CM | POA: Diagnosis not present

## 2017-06-02 DIAGNOSIS — E1122 Type 2 diabetes mellitus with diabetic chronic kidney disease: Secondary | ICD-10-CM | POA: Insufficient documentation

## 2017-06-02 LAB — COMPREHENSIVE METABOLIC PANEL
ALBUMIN: 3.5 g/dL (ref 3.5–5.0)
ALT: 19 U/L (ref 17–63)
ANION GAP: 9 (ref 5–15)
AST: 32 U/L (ref 15–41)
Alkaline Phosphatase: 38 U/L (ref 38–126)
BUN: 27 mg/dL — AB (ref 6–20)
CHLORIDE: 104 mmol/L (ref 101–111)
CO2: 24 mmol/L (ref 22–32)
Calcium: 8.9 mg/dL (ref 8.9–10.3)
Creatinine, Ser: 1.75 mg/dL — ABNORMAL HIGH (ref 0.61–1.24)
GFR calc Af Amer: 45 mL/min — ABNORMAL LOW (ref >60)
GFR calc non Af Amer: 39 mL/min — ABNORMAL LOW (ref >60)
GLUCOSE: 210 mg/dL — AB (ref 65–99)
POTASSIUM: 3.9 mmol/L (ref 3.5–5.1)
SODIUM: 137 mmol/L (ref 135–145)
Total Bilirubin: 0.8 mg/dL (ref 0.3–1.2)
Total Protein: 6.6 g/dL (ref 6.5–8.1)

## 2017-06-02 LAB — CBC
HCT: 40.8 % (ref 39.0–52.0)
HEMOGLOBIN: 13.3 g/dL (ref 13.0–17.0)
MCH: 30.2 pg (ref 26.0–34.0)
MCHC: 32.6 g/dL (ref 30.0–36.0)
MCV: 92.5 fL (ref 78.0–100.0)
PLATELETS: 170 10*3/uL (ref 150–400)
RBC: 4.41 MIL/uL (ref 4.22–5.81)
RDW: 12.9 % (ref 11.5–15.5)
WBC: 11.1 10*3/uL — ABNORMAL HIGH (ref 4.0–10.5)

## 2017-06-02 LAB — I-STAT CHEM 8, ED
BUN: 30 mg/dL — AB (ref 6–20)
CHLORIDE: 101 mmol/L (ref 101–111)
Calcium, Ion: 1.13 mmol/L — ABNORMAL LOW (ref 1.15–1.40)
Creatinine, Ser: 1.7 mg/dL — ABNORMAL HIGH (ref 0.61–1.24)
Glucose, Bld: 206 mg/dL — ABNORMAL HIGH (ref 65–99)
HEMATOCRIT: 41 % (ref 39.0–52.0)
Hemoglobin: 13.9 g/dL (ref 13.0–17.0)
Potassium: 4.2 mmol/L (ref 3.5–5.1)
Sodium: 140 mmol/L (ref 135–145)
TCO2: 24 mmol/L (ref 22–32)

## 2017-06-02 LAB — DIFFERENTIAL
BASOS ABS: 0.1 10*3/uL (ref 0.0–0.1)
BASOS PCT: 1 %
EOS ABS: 0.2 10*3/uL (ref 0.0–0.7)
EOS PCT: 1 %
Lymphocytes Relative: 27 %
Lymphs Abs: 3 10*3/uL (ref 0.7–4.0)
Monocytes Absolute: 0.6 10*3/uL (ref 0.1–1.0)
Monocytes Relative: 6 %
NEUTROS PCT: 65 %
Neutro Abs: 7.2 10*3/uL (ref 1.7–7.7)

## 2017-06-02 LAB — I-STAT TROPONIN, ED: Troponin i, poc: 0.01 ng/mL (ref 0.00–0.08)

## 2017-06-02 LAB — PROTIME-INR
INR: 1.11
PROTHROMBIN TIME: 14.3 s (ref 11.4–15.2)

## 2017-06-02 LAB — CBG MONITORING, ED: GLUCOSE-CAPILLARY: 217 mg/dL — AB (ref 65–99)

## 2017-06-02 LAB — APTT: APTT: 26 s (ref 24–36)

## 2017-06-02 NOTE — ED Provider Notes (Signed)
[facility] EMERGENCY DEPARTMENT Provider Note  CSN: [id number] Arrival date & time: 06/02/17 1334  Chief Complaint(s) Fall  HPI [name] is a 67 y.o. male with history of prior strokes with right-sided deficits who presents to the emergency department for recurrent falls over the past couple of days and confusion.  Patient reports that he rolled out of bed couple days ago resulting in minor head trauma.  No loss of consciousness.  No worsening or new focal weakness.  Daughter did report that the patient has been having confusion over the past couple days reporting that he called her stating that he was locked out of the house when he was actually inside the home.  She also states that he was been looking for keys to a job that he has not worked in over 10 years.  She reports that these are similar symptoms to the previous stroke that he had last year and is concerned he might be having another stroke.  Family and patient deny any recent fevers or infections.  No coughing, congestion, chest pain, shortness of breath, nausea, vomiting, diarrhea, abdominal pain, urinary symptoms.  Patient appears to be at baseline per daughter.   HPI  Past Medical History Past Medical History:  Diagnosis Date  . 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 CVA (cerebral infarction) 01/03/2008   MRI: Acute 1 x 1.5 cm infarction affecting the left side of the pons.  . Diabetes mellitus   . DVT (deep venous thrombosis) ([place])   . Hypertension   . PAD (peripheral artery disease) ([place])   . Stroke [facility])    2008   Patient Active Problem List   Diagnosis Date Noted  . Dysarthria as late effect of stroke 08/18/2016  . Stage 2 chronic kidney disease   . Leukocytosis   . Benign essential HTN   . Spastic hemiparesis ([place])   . Aphasia due to recent cerebral infarction 07/04/2016  . Cerebrovascular accident (CVA) ([place])   . Altered mental status 06/30/2016  . Fall 06/30/2016  .  Acute encephalopathy 06/30/2016  . Sepsis ([place]) 06/30/2016  . Diarrhea 06/30/2016  . Acute renal failure superimposed on stage 2 chronic kidney disease ([place]) 06/30/2016  . Hemiparesis affecting right side as late effect of stroke ([place]) 10/23/2015  . PAD (peripheral artery disease) ([place]) 10/20/2011  . Hyperlipidemia 02/21/2010  . CEREBROVASCULAR ACCIDENT, HX OF 12/22/2008  . Diabetes mellitus, type II ([place]) 07/30/2006  . OBESITY, NOS 07/30/2006  . Essential hypertension, benign 07/30/2006   Home Medication(s) Prior to Admission medications   Medication Sig Start Date End Date Taking? Authorizing Provider  amLODipine (NORVASC) 10 MG tablet Take 1 tablet (10 mg total) by mouth daily. Patient taking differently: Take 10 mg by mouth daily with lunch.  05/29/17  Yes Mikell, Jeani Sow, MD  atorvastatin (LIPITOR) 40 MG tablet Take 1 tablet (40 mg total) by mouth daily. Patient taking differently: Take 40 mg by mouth daily with lunch.  05/29/17  Yes Mikell, Jeani Sow, MD  Capsicum, Cayenne, (CAYENNE PEPPER PO) Take 1 tablet by mouth at bedtime.   Yes [provider]  carvedilol (COREG) 25 MG tablet TAKE 1 TABLET BY MOUTH TWICE A DAY WITH A MEAL Patient taking differently: Take 25 mg by mouth See admin instructions. Take 1 tablet (25 mg) by mouth twice daily - with lunch and supper 05/29/17  Yes Mikell, Jeani Sow, MD  chlorthalidone (HYGROTON) 25 MG tablet Take 1 tablet (25 mg total)  by mouth daily. Patient taking differently: Take 25 mg by mouth daily with lunch.  05/29/17  Yes Mikell, Jeani Sow, MD  clopidogrel (PLAVIX) 75 MG tablet TAKE 1 TABLET (75 MG TOTAL) BY MOUTH DAILY. Patient taking differently: Take 75 mg by mouth daily with lunch.  12/02/16  Yes Dennie Bible, NP  insulin aspart (NOVOLOG FLEXPEN) 100 UNIT/ML FlexPen Inject 15 Units into the skin 3 (three) times daily with meals. Patient taking differently: Inject 15 Units into the skin See admin instructions.  Inject 15 units subcutaneously twice daily - with lunch and supper 09/03/16  Yes Mikell, Jeani Sow, MD  Insulin Detemir (LEVEMIR FLEXTOUCH) 100 UNIT/ML Pen Inject 27 Units into the skin 2 (two) times daily. Patient taking differently: Inject 30 Units into the skin at bedtime.  04/29/17  Yes Mikell, Jeani Sow, MD  lisinopril (PRINIVIL,ZESTRIL) 40 MG tablet Take 1 tablet (40 mg total) by mouth daily. Patient taking differently: Take 40 mg by mouth daily with lunch.  09/03/16  Yes Mikell, Jeani Sow, MD  metFORMIN (GLUCOPHAGE) 1000 MG tablet Take 1 tablet (1,000 mg total) by mouth 2 (two) times daily with a meal. Patient taking differently: Take 1,000 mg by mouth See admin instructions. Take 1 tablet (1000 mg) by mouth twice daily - with lunch and supper 03/13/17  Yes Mikell, Jeani Sow, MD  sertraline (ZOLOFT) 100 MG tablet TAKE 1 TABLET BY MOUTH EVERY DAY Patient taking differently: TAKE 1 TABLET (100 MG) BY MOUTH DAILY WITH LUNCH 05/22/17  Yes Mikell, Jeani Sow, MD  Blood Gluc Meter Disp-Strips (SIDEKICK BLOOD GLUCOSE SYSTEM) DEVI Use for daily testing 08/06/16   Lauree Chandler, NP  Insulin Pen Needle (B-D ULTRAFINE III SHORT PEN) 31G X 8 MM MISC USE WITH LEVEMIR 08/06/16   Lauree Chandler, NP  Insulin Syringe-Needle U-100 28G X 1/2" 1 ML MISC 1 each by Does not apply route 3 (three) times daily. 08/06/16   Lauree Chandler, NP                                                                                                                                    Past Surgical History Past Surgical History:  Procedure Laterality Date  . COLONOSCOPY    . None     Family History Family History  Problem Relation Age of Onset  . Diabetes Mother   . Heart attack Mother   . Hypertension Mother   . Diabetes Sister   . Hypertension Sister   . Stroke Sister        2017  . Hypertension Brother   . Hypertension Daughter   . Hypertension Son   . Colon cancer Neg Hx   . Esophageal cancer Neg  Hx   . Stomach cancer Neg Hx   . Rectal cancer Neg Hx     Social History Social History   Tobacco Use  . Smoking status: Never Smoker  .  Smokeless tobacco: Never Used  Substance Use Topics  . Alcohol use: No  . Drug use: No   Allergies Patient has no known allergies.  Review of Systems Review of Systems All other systems are reviewed and are negative for acute change except as noted in the HPI  Physical Exam Vital Signs  I have reviewed the triage vital signs BP (!) 118/59   Pulse 68   Temp 98.6 F (37 C) (Oral)   Resp 16   Ht 5\' 7"  (1.702 m)   Wt 104.3 kg (230 lb)   SpO2 100%   BMI 36.02 kg/m   Physical Exam  Constitutional: He is oriented to person, place, and time. He appears well-developed and well-nourished. No distress.  HENT:  Head: Normocephalic and atraumatic.  Nose: Nose normal.  Eyes: Conjunctivae and EOM are normal. Pupils are equal, round, and reactive to light. Right eye exhibits no discharge. Left eye exhibits no discharge. No scleral icterus.  Neck: Normal range of motion. Neck supple. No spinous process tenderness present.  Cardiovascular: Normal rate and regular rhythm. Exam reveals no gallop and no friction rub.  No murmur heard. Pulmonary/Chest: Effort normal and breath sounds normal. No stridor. No respiratory distress. He has no rales.  Abdominal: Soft. He exhibits no distension. There is no tenderness.  Musculoskeletal: He exhibits no edema or tenderness.       Left elbow: He exhibits normal range of motion and no swelling. No tenderness found.       Arms: Neurological: He is alert and oriented to person, place, and time.  Mental Status:  Alert and oriented to person, place, and time.  Attention and concentration normal.  Speech dysarthric.  Recent memory is intact  Cranial Nerves:  II Visual Fields: Intact to confrontation. Visual fields intact. III, IV, VI: Pupils equal and reactive to light and near. Full eye movement without  nystagmus  V Facial Sensation: Normal. No weakness of masticatory muscles  VII: Right facial droop VIII Auditory Acuity: Grossly normal  IX/X: The uvula is midline; the palate elevates symmetrically  XI: Normal sternocleidomastoid and trapezius strength  XII: The tongue is midline. No atrophy or fasciculations.   Motor System: Muscle Strength: Residual right upper and lower extremity deficits, and stricture to the right hand.  Otherwise 5 out of 5 strength. Muscle Tone: Tone and muscle bulk are normal in the upper and lower extremities.   Reflexes: DTRs: 1+ and symmetrical in all four extremities. No Clonus Coordination: Intact finger-to-nose, heel-to-shin. No tremor.  Sensation: Intact to light touch, and pinprick. Negative Romberg test.  Gait: Routine and tandem gait normal.   Skin: Skin is warm and dry. No rash noted. He is not diaphoretic. No erythema.  Psychiatric: He has a normal mood and affect.  Vitals reviewed.   ED Results and Treatments Labs (all labs ordered are listed, but only abnormal results are displayed) Labs Reviewed  CBC - Abnormal; Notable for the following components:      Result Value   WBC 11.1 (*)    All other components within normal limits  COMPREHENSIVE METABOLIC PANEL - Abnormal; Notable for the following components:   Glucose, Bld 210 (*)    BUN 27 (*)    Creatinine, Ser 1.75 (*)    GFR calc non Af Amer 39 (*)    GFR calc Af Amer 45 (*)    All other components within normal limits  CBG MONITORING, ED - Abnormal; Notable for the following components:   Glucose-Capillary  217 (*)    All other components within normal limits  I-STAT CHEM 8, ED - Abnormal; Notable for the following components:   BUN 30 (*)    Creatinine, Ser 1.70 (*)    Glucose, Bld 206 (*)    Calcium, Ion 1.13 (*)    All other components within normal limits  ETHANOL  PROTIME-INR  APTT  DIFFERENTIAL  RAPID URINE DRUG SCREEN, HOSP PERFORMED  URINALYSIS, ROUTINE W REFLEX  MICROSCOPIC  I-STAT TROPONIN, ED                                                                                                                         EKG  EKG Interpretation  Date/Time:  Tuesday June 02 2017 22:52:09 EST Ventricular Rate:  69 PR Interval:    QRS Duration: 80 QT Interval:  418 QTC Calculation: 448 R Axis:   33 Text Interpretation:  Sinus rhythm Abnormal R-wave progression, early transition No significant change since last tracing Confirmed by Addison Lank [phone]) on 06/02/2017 10:56:14 PM      Radiology Ct Head Wo Contrast  Result Date: 06/02/2017 CLINICAL DATA:  Patient fell off toilet. Focal neurologic deficit greater than 6 hours. EXAM: CT HEAD WITHOUT CONTRAST TECHNIQUE: Contiguous axial images were obtained from the base of the skull through the vertex without intravenous contrast. COMPARISON:  06/29/2016 head CT FINDINGS: Brain: No acute intracranial hemorrhage. No intra-axial mass nor extra-axial fluid collections. 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 Chronic small vessel ischemic disease of periventricular white matter. Vascular: No hyperdense vessels Skull: The bony calvarium appears intact. No significant scalp soft tissue swelling. Sinuses/Orbits: Clear paranasal sinuses.  Intact orbits and globes. Other: None IMPRESSION: No acute intracranial abnormality. Chronic small vessel ischemic disease. Electronically Signed   By: Ashley Royalty M.D.   On: 06/02/2017 23:31   Pertinent labs & imaging results that were available during my care of the patient were reviewed by me and considered in my medical decision making (see chart for details).  Medications Ordered in ED Medications - No data to display                                                                                                                                  Procedures Procedures  (including critical care time)  Medical Decision Making / ED Course I  have reviewed the nursing notes for this  encounter and the patient's prior records (if available in EHR or on provided paperwork).    Patient currently at baseline mental status with residual right-sided deficits from prior CVA no acute changes or focal deficits.  Given the recent fall CT head was obtained which revealed no evidence of ICH.  Labs were close to patient's baseline and grossly reassuring without evidence of metabolic derangement.  Currently awaiting urinalysis to assess for possible infection.  Also awaiting MRI to assess for new CVA.  If workup is unremarkable, patient would be safe for discharge.  Patient care turned over to Dr Ward at [facility]. Patient case and results discussed in detail; please see their note for further ED managment.    This chart was dictated using voice recognition software.  Despite best efforts to proofread,  errors can occur which can change the documentation meaning.   Fatima Blank, MD 06/03/17 [phone]

## 2017-06-02 NOTE — ED Notes (Addendum)
Pt states he has no pain or complaints.  However, family at the bedside requesting a complete stroke/TIA workup.  Family also states pt's LKW was December 29th.

## 2017-06-02 NOTE — ED Notes (Signed)
CBG: 217. RN notified.

## 2017-06-02 NOTE — ED Triage Notes (Signed)
Pt presents for evaluation of mechanical fall today. Pt reports no complaints or pain. Family adamant about pt coming for evaluation.

## 2017-06-02 NOTE — ED Notes (Signed)
Pt in restroom at time of vitals assessment

## 2017-06-02 NOTE — ED Notes (Signed)
Pt family to nurse first, upset about wait times, family stating that they want the patient evaluated for a TIA due to fall today

## 2017-06-02 NOTE — ED Notes (Signed)
Upon checking for vitals pt was determined to be wet, urination suspected. Pt taken to triage to be cleaned and placed in scrubs.

## 2017-06-02 NOTE — ED Notes (Signed)
Patient transported to CT 

## 2017-06-03 ENCOUNTER — Emergency Department (HOSPITAL_COMMUNITY): Payer: Medicare HMO

## 2017-06-03 DIAGNOSIS — I129 Hypertensive chronic kidney disease with stage 1 through stage 4 chronic kidney disease, or unspecified chronic kidney disease: Secondary | ICD-10-CM | POA: Diagnosis not present

## 2017-06-03 DIAGNOSIS — Z940 Kidney transplant status: Secondary | ICD-10-CM | POA: Diagnosis not present

## 2017-06-03 DIAGNOSIS — G4751 Confusional arousals: Secondary | ICD-10-CM | POA: Diagnosis not present

## 2017-06-03 DIAGNOSIS — Z794 Long term (current) use of insulin: Secondary | ICD-10-CM | POA: Diagnosis not present

## 2017-06-03 DIAGNOSIS — Z7902 Long term (current) use of antithrombotics/antiplatelets: Secondary | ICD-10-CM | POA: Diagnosis not present

## 2017-06-03 DIAGNOSIS — S0990XA Unspecified injury of head, initial encounter: Secondary | ICD-10-CM | POA: Diagnosis not present

## 2017-06-03 DIAGNOSIS — N182 Chronic kidney disease, stage 2 (mild): Secondary | ICD-10-CM | POA: Diagnosis not present

## 2017-06-03 DIAGNOSIS — W06XXXA Fall from bed, initial encounter: Secondary | ICD-10-CM | POA: Diagnosis not present

## 2017-06-03 DIAGNOSIS — E1122 Type 2 diabetes mellitus with diabetic chronic kidney disease: Secondary | ICD-10-CM | POA: Diagnosis not present

## 2017-06-03 DIAGNOSIS — Z8673 Personal history of transient ischemic attack (TIA), and cerebral infarction without residual deficits: Secondary | ICD-10-CM | POA: Diagnosis not present

## 2017-06-03 LAB — URINALYSIS, ROUTINE W REFLEX MICROSCOPIC
BILIRUBIN URINE: NEGATIVE
GLUCOSE, UA: NEGATIVE mg/dL
Hgb urine dipstick: NEGATIVE
KETONES UR: NEGATIVE mg/dL
LEUKOCYTES UA: NEGATIVE
Nitrite: NEGATIVE
Protein, ur: NEGATIVE mg/dL
SPECIFIC GRAVITY, URINE: 1.018 (ref 1.005–1.030)
pH: 5 (ref 5.0–8.0)

## 2017-06-03 LAB — RAPID URINE DRUG SCREEN, HOSP PERFORMED
Amphetamines: NOT DETECTED
BARBITURATES: NOT DETECTED
BENZODIAZEPINES: NOT DETECTED
COCAINE: NOT DETECTED
Opiates: NOT DETECTED
Tetrahydrocannabinol: NOT DETECTED

## 2017-06-03 LAB — ETHANOL

## 2017-06-03 NOTE — Discharge Instructions (Signed)
Your labs, urine and MRI of your brain today did not show any acute abnormality.  Please follow-up closely with your primary care physician in the next 1-2 days.

## 2017-06-03 NOTE — ED Notes (Signed)
Pt transported to MRI 

## 2017-06-03 NOTE — ED Provider Notes (Signed)
Assumed care from Dr. Leonette Monarch.  Patient is a 67 y.o. male with previous history of CVA with residual right-sided deficits who comes in today with some mild confusion.  He also fell out of bed 2 days ago and struck his head.  Daughter reports he was "talking out of his head".  He is oriented here and has no new focal neurologic deficits.  His workup is unremarkable.  No urinary tract infection.  Labs show no acute abnormality.  MRI of the brain shows no acute intracranial abnormality.  Patient can be discharged home.   I reviewed all nursing notes, vitals, pertinent previous records, EKGs, lab and urine results, imaging (as available).    Nimesh Riolo, Delice Bison, DO 06/03/17 [phone]

## 2017-06-10 ENCOUNTER — Ambulatory Visit: Payer: Medicare Other | Admitting: Nurse Practitioner

## 2017-06-10 ENCOUNTER — Telehealth: Payer: Self-pay | Admitting: *Deleted

## 2017-06-10 NOTE — Progress Notes (Deleted)
[facility]  PATIENT: [name] DOB: 22-Aug-1950   REASON FOR VISIT:  follow-up for stroke  HISTORY from patient   HISTORY OF PRESENT ILLNESS: UPDATE 12/02/16 CM Mr. [name], 67 year old male returns for follow-up with history of stroke 07/01/2016. He had a previous stroke in 2009. He is currently on Plavix and aspirin for secondary stroke prevention. He has not had further stroke or TIA symptoms He was asked to stop his aspirin after 3 months at his last visit however he has continued with aspirin as well. Blood pressure in the office today 89/52. He claims he does not drink much water. He is continuing to get occupational therapy and physical therapy outpatient. He ambulates with a quad cane. He denies any falls. He has had problems controlling his blood sugars had some medication changes to his diabetic regimen. Most recent hemoglobin A1c 7.4 in April 4,2018. He remains on Lipitor without myalgias. He is currently living alone. He says he is independent in activities of daily living. He does his cooking. He denies any discomfort He returns for reevaluation   HISTORY: 09/02/16 CM Mr. [name], 67 year old male was admitted to the hospital in January presenting with lethargy for 3 days and multiple falls is a history of hypertension diabetes and previous stroke on the right with right hemiparesis. MRI left ACA infarct. CTA head and neck right greater than left atherosclerotic disease 2-D echo 55-60% EF. LDL 60. Hemoglobin A1c 6.9 he returns to the clinic for follow-up. He lives alone he remains on Plavix and aspirin for 3 months then will go to Plavix alone. Blood pressure in the office today 133/70. He is on Lipitor for hyperlipidemia without complaints of myalgias. He is also insulin-dependent diabetic and claims his blood sugars are better controlled. He continues to get occupational therapy and speech therapy in the home. His previous stroke was in 2009. He returns for  reevaluation   REVIEW OF SYSTEMS: Full 14 system review of systems performed and notable only for those listed, all others are neg:  Constitutional: neg  Cardiovascular: neg Ear/Nose/Throat: neg  Skin: neg Eyes: neg Respiratory: neg Gastroitestinal: neg  Hematology/Lymphatic: neg  Endocrine: neg Musculoskeletal: Walking difficulty Allergy/Immunology: neg Neurological: neg Psychiatric: neg Sleep : neg   ALLERGIES: No Known Allergies  HOME MEDICATIONS: Outpatient Medications Prior to Visit  Medication Sig Dispense Refill  . amLODipine (NORVASC) 10 MG tablet Take 1 tablet (10 mg total) by mouth daily. (Patient taking differently: Take 10 mg by mouth daily with lunch. ) 90 tablet 3  . atorvastatin (LIPITOR) 40 MG tablet Take 1 tablet (40 mg total) by mouth daily. (Patient taking differently: Take 40 mg by mouth daily with lunch. ) 90 tablet 3  . Blood Gluc Meter Disp-Strips (SIDEKICK BLOOD GLUCOSE SYSTEM) DEVI Use for daily testing 100 each 3  . Capsicum, Cayenne, (CAYENNE PEPPER PO) Take 1 tablet by mouth at bedtime.    . carvedilol (COREG) 25 MG tablet TAKE 1 TABLET BY MOUTH TWICE A DAY WITH A MEAL (Patient taking differently: Take 25 mg by mouth See admin instructions. Take 1 tablet (25 mg) by mouth twice daily - with lunch and supper) 120 tablet 3  . chlorthalidone (HYGROTON) 25 MG tablet Take 1 tablet (25 mg total) by mouth daily. (Patient taking differently: Take 25 mg by mouth daily with lunch. ) 90 tablet 3  . clopidogrel (PLAVIX) 75 MG tablet TAKE 1 TABLET (75 MG TOTAL) BY MOUTH DAILY. (Patient taking differently: Take 75 mg by  mouth daily with lunch. ) 30 tablet 6  . insulin aspart (NOVOLOG FLEXPEN) 100 UNIT/ML FlexPen Inject 15 Units into the skin 3 (three) times daily with meals. (Patient taking differently: Inject 15 Units into the skin See admin instructions. Inject 15 units subcutaneously twice daily - with lunch and supper) 15 mL 11  . Insulin Detemir (LEVEMIR FLEXTOUCH)  100 UNIT/ML Pen Inject 27 Units into the skin 2 (two) times daily. (Patient taking differently: Inject 30 Units into the skin at bedtime. ) 15 pen 5  . Insulin Pen Needle (B-D ULTRAFINE III SHORT PEN) 31G X 8 MM MISC USE WITH LEVEMIR 100 each 0  . Insulin Syringe-Needle U-100 28G X 1/2" 1 ML MISC 1 each by Does not apply route 3 (three) times daily. 100 each 11  . lisinopril (PRINIVIL,ZESTRIL) 40 MG tablet Take 1 tablet (40 mg total) by mouth daily. (Patient taking differently: Take 40 mg by mouth daily with lunch. ) 90 tablet 3  . metFORMIN (GLUCOPHAGE) 1000 MG tablet Take 1 tablet (1,000 mg total) by mouth 2 (two) times daily with a meal. (Patient taking differently: Take 1,000 mg by mouth See admin instructions. Take 1 tablet (1000 mg) by mouth twice daily - with lunch and supper) 180 tablet 3  . sertraline (ZOLOFT) 100 MG tablet TAKE 1 TABLET BY MOUTH EVERY DAY (Patient taking differently: TAKE 1 TABLET (100 MG) BY MOUTH DAILY WITH LUNCH) 30 tablet 1   No facility-administered medications prior to visit.     PAST MEDICAL HISTORY: Past Medical History:  Diagnosis Date  . 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 CVA (cerebral infarction) 01/03/2008   MRI: Acute 1 x 1.5 cm infarction affecting the left side of the pons.  . Diabetes mellitus   . DVT (deep venous thrombosis) ([place])   . Hypertension   . PAD (peripheral artery disease) ([place])   . Stroke [facility])    2008    PAST SURGICAL HISTORY: Past Surgical History:  Procedure Laterality Date  . COLONOSCOPY    . None      FAMILY HISTORY: Family History  Problem Relation Age of Onset  . Diabetes Mother   . Heart attack Mother   . Hypertension Mother   . Diabetes Sister   . Hypertension Sister   . Stroke Sister        2017  . Hypertension Brother   . Hypertension Daughter   . Hypertension Son   . Colon cancer Neg Hx   . Esophageal cancer Neg Hx   . Stomach cancer Neg Hx   . Rectal cancer Neg Hx     SOCIAL  HISTORY: Social History   Socioeconomic History  . Marital status: Divorced    Spouse name: Not on file  . Number of children: 3  . Years of education: 35  . Highest education level: Not on file  Social Needs  . Financial resource strain: Not on file  . Food insecurity - worry: Not on file  . Food insecurity - inability: Not on file  . Transportation needs - medical: Not on file  . Transportation needs - non-medical: Not on file  Occupational History  . Occupation: Disabled-security guard    Employer: [employer]  Tobacco Use  . Smoking status: Never Smoker  . Smokeless tobacco: Never Used  Substance and Sexual Activity  . Alcohol use: No  . Drug use: No  . Sexual activity: No    Partners: Female  Other Topics Concern  .  Not on file  Social History Narrative   Divorced, lives alone   2 sons, 1 daughter   Retired/disabled   No caffeine   12/24/2015     PHYSICAL EXAM  There were no vitals filed for this visit. There is no height or weight on file to calculate BMI.  Generalized: Well developed, Obese male in no acute distress  Head: normocephalic and atraumatic,. Oropharynx benign  Neck: Supple, no carotid bruits  Cardiac: Regular rate rhythm, no murmur  Musculoskeletal: No deformity   Neurological examination   Mentation: Alert oriented to time, place, history taking. Attention span and concentration appropriate. Recent and remote memory intact.  Follows all commands speech  with dysarthria .   Cranial nerve II-XII: Pupils were equal round reactive to light extraocular movements were full,  mild left ptosis visual field were full on confrontational test.  mild left facial droop  hearing was intact to finger rubbing bilaterally. Uvula tongue midline. head turning and shoulder shrug were normal and symmetric.Tongue protrusion into cheek strength was normal. Motor: normal bulk and tone, full strength in the BUE, BLE,on the left, 0 out of 5 right upper extremity  and right lower extremity. Has brace to right lower extremity  fine finger movements normal, no pronator drift. No focal weakness Sensory: normal and symmetric to light touch, pinprick, and  Vibration,in the upper and lower extremities  Coordination: finger-nose-finger, heel-to-shin bilaterally,normal on the left  Reflexes:1+ in the upper and lower extremities, plantar responses were flexor bilaterally. Gait and Station: Rising up from seated position with push off, ambulated short distance with a quad cane . Slow  gait  DIAGNOSTIC DATA (LABS, IMAGING, TESTING) - I reviewed patient records, labs, notes, testing and imaging myself where available.  Lab Results  Component Value Date   WBC 11.1 (H) 06/02/2017   HGB 13.9 06/02/2017   HCT 41.0 06/02/2017   MCV 92.5 06/02/2017   PLT 170 06/02/2017      Component Value Date/Time   NA 140 06/02/2017 2310   K 4.2 06/02/2017 2310   CL 101 06/02/2017 2310   CO2 24 06/02/2017 2300   GLUCOSE 206 (H) 06/02/2017 2310   BUN 30 (H) 06/02/2017 2310   CREATININE 1.70 (H) 06/02/2017 2310   CREATININE 1.02 11/05/2015 1203   CALCIUM 8.9 06/02/2017 2300   PROT 6.6 06/02/2017 2300   ALBUMIN 3.5 06/02/2017 2300   AST 32 06/02/2017 2300   ALT 19 06/02/2017 2300   ALKPHOS 38 06/02/2017 2300   BILITOT 0.8 06/02/2017 2300   GFRNONAA 39 (L) 06/02/2017 2300   GFRNONAA 77 11/05/2015 1203   GFRAA 45 (L) 06/02/2017 2300   GFRAA 89 11/05/2015 1203   Lab Results  Component Value Date   CHOL 91 07/04/2016   HDL 22 (L) 07/04/2016   LDLCALC 60 07/04/2016   LDLDIRECT 78 09/10/2011   TRIG 43 07/04/2016   CHOLHDL 4.1 07/04/2016   Lab Results  Component Value Date   HGBA1C 7.3 09/03/2016    ASSESSMENT AND PLAN  67 y.o. year old male  here for hospital follow-up for stroke with risk factors of hypertension diabetes and previous stroke peripheral vascular disease chronic kidney disease and hyperlipidemia The patient is a current patient of Dr. Erlinda Hong  who is  out of the office today . This note is sent to the work in doctor.     PLAN: Stressed the importance of management of risk factors to prevent further stroke Continue  Plavix for secondary stroke prevention  Stop aspirin Maintain strict control of hypertension with blood pressure goal below 130/90, today's reading 89/52 continue antihypertensive medications Stay well hydrated with water Control of diabetes with hemoglobin A1c below 6.5 followed by primary care most recent hemoglobin A1c7.3 continue diabetic medications Cholesterol with LDL cholesterol less than 70, followed by primary care,   continue Lipitor Continue PT and OT then home exercise program when discharged Patient is a fall risk, uses a quad cane at all times,and brace to RLE  eat healthy diet with whole grains,  fresh fruits and vegetables Follow up in 6 months Dennie Bible, [facility], [facility], [facility] [address], [place], [place] [address] [phone]

## 2017-06-10 NOTE — Telephone Encounter (Signed)
Patient was no show for follow up with NP today.  

## 2017-06-11 ENCOUNTER — Encounter: Payer: Self-pay | Admitting: Nurse Practitioner

## 2017-06-12 ENCOUNTER — Encounter: Payer: Self-pay | Admitting: Physical Medicine & Rehabilitation

## 2017-06-12 ENCOUNTER — Ambulatory Visit: Payer: Medicare HMO | Admitting: Physical Medicine & Rehabilitation

## 2017-06-12 ENCOUNTER — Encounter: Payer: Medicare HMO | Attending: Physical Medicine & Rehabilitation

## 2017-06-12 VITALS — BP 99/64 | HR 66 | Resp 14

## 2017-06-12 DIAGNOSIS — Z794 Long term (current) use of insulin: Secondary | ICD-10-CM | POA: Insufficient documentation

## 2017-06-12 DIAGNOSIS — G8111 Spastic hemiplegia affecting right dominant side: Secondary | ICD-10-CM | POA: Diagnosis not present

## 2017-06-12 DIAGNOSIS — E1122 Type 2 diabetes mellitus with diabetic chronic kidney disease: Secondary | ICD-10-CM | POA: Diagnosis not present

## 2017-06-12 DIAGNOSIS — I69322 Dysarthria following cerebral infarction: Secondary | ICD-10-CM | POA: Insufficient documentation

## 2017-06-12 DIAGNOSIS — I69351 Hemiplegia and hemiparesis following cerebral infarction affecting right dominant side: Secondary | ICD-10-CM

## 2017-06-12 DIAGNOSIS — T8611 Kidney transplant rejection: Secondary | ICD-10-CM | POA: Diagnosis not present

## 2017-06-12 DIAGNOSIS — I129 Hypertensive chronic kidney disease with stage 1 through stage 4 chronic kidney disease, or unspecified chronic kidney disease: Secondary | ICD-10-CM | POA: Diagnosis not present

## 2017-06-12 DIAGNOSIS — Z833 Family history of diabetes mellitus: Secondary | ICD-10-CM | POA: Diagnosis not present

## 2017-06-12 DIAGNOSIS — I739 Peripheral vascular disease, unspecified: Secondary | ICD-10-CM | POA: Insufficient documentation

## 2017-06-12 DIAGNOSIS — Z8249 Family history of ischemic heart disease and other diseases of the circulatory system: Secondary | ICD-10-CM | POA: Diagnosis not present

## 2017-06-12 DIAGNOSIS — N172 Acute kidney failure with medullary necrosis: Secondary | ICD-10-CM | POA: Insufficient documentation

## 2017-06-12 DIAGNOSIS — Z86718 Personal history of other venous thrombosis and embolism: Secondary | ICD-10-CM | POA: Insufficient documentation

## 2017-06-12 DIAGNOSIS — N189 Chronic kidney disease, unspecified: Secondary | ICD-10-CM | POA: Insufficient documentation

## 2017-06-12 NOTE — Progress Notes (Signed)
Subjective:    Patient ID: [name], male    DOB: 12-03-1950, 67 y.o.   MRN: [id number]  HPI Right hand still difficult to open although better than prior to Dysport injection.  No significant pain in the right hand.  Continues to ambulate with a right AFO and cane. No new medical problems  Dysport injection performed 11/16 /2018 Biceps 300 units FCR, 200 units. FDS 200 units. FDP 100 Pronator teres 100 units. PQ, 100 units   Pain Inventory Average Pain 0 Pain Right Now 0 My pain is no pain  In the last 24 hours, has pain interfered with the following? General activity 0 Relation with others 0 Enjoyment of life 0 What TIME of day is your pain at its worst? no pain Sleep (in general) Good  Pain is worse with: no pain Pain improves with: no pain Relief from Meds: no pain  Mobility walk with assistance use a cane ability to climb steps?  yes do you drive?  no  Function disabled: date disabled 2018  Neuro/Psych trouble walking  Prior Studies Any changes since last visit?  no  Physicians involved in your care Any changes since last visit?  no   Family History  Problem Relation Age of Onset  . Diabetes Mother   . Heart attack Mother   . Hypertension Mother   . Diabetes Sister   . Hypertension Sister   . Stroke Sister        2017  . Hypertension Brother   . Hypertension Daughter   . Hypertension Son   . Colon cancer Neg Hx   . Esophageal cancer Neg Hx   . Stomach cancer Neg Hx   . Rectal cancer Neg Hx    Social History   Socioeconomic History  . Marital status: Divorced    Spouse name: None  . Number of children: 3  . Years of education: 30  . Highest education level: None  Social Needs  . Financial resource strain: None  . Food insecurity - worry: None  . Food insecurity - inability: None  . Transportation needs - medical: None  . Transportation needs - non-medical: None  Occupational History  . Occupation: Disabled-security  guard    Employer: [employer]  Tobacco Use  . Smoking status: Never Smoker  . Smokeless tobacco: Never Used  Substance and Sexual Activity  . Alcohol use: No  . Drug use: No  . Sexual activity: No    Partners: Female  Other Topics Concern  . None  Social History Narrative   Divorced, lives alone   2 sons, 1 daughter   Retired/disabled   No caffeine   12/24/2015   Past Surgical History:  Procedure Laterality Date  . COLONOSCOPY    . None     Past Medical History:  Diagnosis Date  . 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 CVA (cerebral infarction) 01/03/2008   MRI: Acute 1 x 1.5 cm infarction affecting the left side of the pons.  . Diabetes mellitus   . DVT (deep venous thrombosis) ([place])   . Hypertension   . PAD (peripheral artery disease) ([place])   . Stroke [facility])    2008   There were no vitals taken for this visit.  Opioid Risk Score:   Fall Risk Score:  `1  Depression screen PHQ 2/9  Depression screen [facility] 2/9 11/30/2015 11/05/2015 10/23/2015 09/25/2015 05/02/2014 05/09/2013  Decreased Interest 0 0 0 0 0 0  Down, Depressed,  Hopeless 0 0 0 0 - 0  PHQ - 2 Score 0 0 0 0 0 0    Review of Systems  Constitutional: Negative.   HENT: Negative.   Eyes: Negative.   Respiratory: Negative.   Cardiovascular: Negative.   Gastrointestinal: Negative.   Endocrine: Negative.   Genitourinary: Negative.   Skin: Negative.   Allergic/Immunologic: Negative.   Neurological: Negative.   Hematological: Negative.   Psychiatric/Behavioral: Negative.   All other systems reviewed and are negative.      Objective:   Physical Exam  Constitutional: He is oriented to person, place, and time. He appears well-developed and well-nourished. No distress.  HENT:  Head: Normocephalic and atraumatic.  Eyes: Conjunctivae and EOM are normal. Pupils are equal, round, and reactive to light.  Neck: Normal range of motion.  Neurological: He is alert and oriented to person, place,  and time.  Skin: He is not diaphoretic.  Psychiatric: He has a normal mood and affect.  Nursing note and vitals reviewed.   MAS 3 right finger flexors MAS to right wrist flexor MAS 3 right elbow flexor Motor strength is 3- in the right deltoid, bicep, 2- tricep, 0 finger extensors, trace wrist and finger flexors Right lower extremity 3- hip flexor knee extensor 0 ankle dorsiflexor wearing AFO.  Left side is 5/5     Assessment & Plan:  1.  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  He has had improvements in spasticity at the finger and wrist flexors as well as bicep following Dysport injection.  He has reached the max dose of 1000 units for the upper extremity.  I would recommend increasing his dose to the finger flexors and not injecting the biceps.  Instead would recommend right musculocutaneous nerve block with phenol.  Rec Dysport FCR, 300 units. FDS 300 units. FDP 200 Pronator teres 100 units. PQ, 100 units   need to do Musculocutaneous nerve block with phenol ~4-6wks post Dysport

## 2017-06-23 ENCOUNTER — Other Ambulatory Visit: Payer: Self-pay

## 2017-06-23 MED ORDER — SERTRALINE HCL 100 MG PO TABS: 100.0000 mg | ORAL_TABLET | Freq: Every day | ORAL | 1 refills | Status: DC

## 2017-06-24 ENCOUNTER — Ambulatory Visit: Payer: Medicare HMO | Admitting: Internal Medicine

## 2017-07-14 ENCOUNTER — Ambulatory Visit (INDEPENDENT_AMBULATORY_CARE_PROVIDER_SITE_OTHER): Payer: Medicare HMO | Admitting: Internal Medicine

## 2017-07-14 VITALS — BP 101/70 | HR 71 | Temp 98.1°F | Ht 67.0 in | Wt 240.4 lb

## 2017-07-14 DIAGNOSIS — N182 Chronic kidney disease, stage 2 (mild): Secondary | ICD-10-CM | POA: Diagnosis not present

## 2017-07-14 DIAGNOSIS — F329 Major depressive disorder, single episode, unspecified: Secondary | ICD-10-CM

## 2017-07-14 DIAGNOSIS — F32A Depression, unspecified: Secondary | ICD-10-CM | POA: Insufficient documentation

## 2017-07-14 DIAGNOSIS — E1159 Type 2 diabetes mellitus with other circulatory complications: Secondary | ICD-10-CM | POA: Diagnosis not present

## 2017-07-14 DIAGNOSIS — E782 Mixed hyperlipidemia: Secondary | ICD-10-CM

## 2017-07-14 DIAGNOSIS — I63522 Cerebral infarction due to unspecified occlusion or stenosis of left anterior cerebral artery: Secondary | ICD-10-CM | POA: Diagnosis not present

## 2017-07-14 DIAGNOSIS — I10 Essential (primary) hypertension: Secondary | ICD-10-CM | POA: Diagnosis not present

## 2017-07-14 DIAGNOSIS — Z794 Long term (current) use of insulin: Secondary | ICD-10-CM

## 2017-07-14 DIAGNOSIS — R69 Illness, unspecified: Secondary | ICD-10-CM | POA: Diagnosis not present

## 2017-07-14 LAB — POCT GLYCOSYLATED HEMOGLOBIN (HGB A1C): HEMOGLOBIN A1C: 6.6

## 2017-07-14 MED ORDER — GLUCERNA ADVANCE SHAKE PO LIQD: 1.0000 | Freq: Every day | ORAL | 2 refills | Status: DC

## 2017-07-14 MED ORDER — SERTRALINE HCL 100 MG PO TABS: 100.0000 mg | ORAL_TABLET | Freq: Every day | ORAL | 2 refills | Status: DC

## 2017-07-14 NOTE — Assessment & Plan Note (Addendum)
A1c 6.6 today, goal 7.5.  Reports having once weekly blood sugar lows at night -Decrease Levemir to 15 mg at night and 20 mg in the morning.  Continue current regiment of NovoLog 15 mg daily with meals-patient usually has a lunch and dinner does not have any breakfast  -Explained to patient that he should give me a call if blood sugars are still dropping following the current regiment -Follow-up in 1 month -Discussed with patient that he needs an eye exam this year -Will do diabetic foot exam at next visit

## 2017-07-14 NOTE — Assessment & Plan Note (Signed)
Patient was seen in the ED in the beginning of January for a fall.  Lab work was notable for an elevated serum creatinine baseline usually around 1-1.2 Will check lab work today with a be met

## 2017-07-14 NOTE — Patient Instructions (Addendum)
I want you to decrease your Levemir to 15 units at night before bed and 20 units in the morning when you wake up. I want you to return for a follow up in 1 month to recheck on your blood sugars. If you are still having low blood sugars with changes we have to continue working on your regiment. I also want you to stop Norvasc to as your blood pressure is currently on the lower side. I want you to follow up with this in 1 month. Please make an appointment with your eye doctor and have him send me the result of your eye exam

## 2017-07-14 NOTE — Assessment & Plan Note (Addendum)
Currently doing well. Continue Plavix and atorvastatin Lipid panel Still following with physical medicine and rehab for Botox injections to help with spasticity  Interested in taking supplemental nutritional shakes, provided a prescription for Glucerna

## 2017-07-14 NOTE — Assessment & Plan Note (Signed)
Currently on atorvastatin 40 mg Will need a lipid panel

## 2017-07-14 NOTE — Assessment & Plan Note (Signed)
Refilled Zoloft today for 69-month supply Will evaluate at next visit

## 2017-07-14 NOTE — Progress Notes (Signed)
[name] [name], MD Phone: [phone]  Reason For Visit: Follow-up  # CHRONIC DM, Type 2: States that he is currently having blood sugars lows at least once weekly -indicates jitteriness and weakness.  He will take a Veterinary surgeon every time he has one.  He states that these usually occur at night.  Recently increase his Metformin 1000 mg twice daily.  Has not seen a eye doctor in over 2 years CBGs: Avg 140, Low 60, High 300. Checks CBGs 3 time daily  Meds: Currently taking 30 units of Levemir at night and 30 units in the morning, NovoLog 15 mg with food and metformin 1000 mg  Reports good compliance. Tolerating well w/o side-effects Currently on ACEi / ARB  Denies polyuria, visual changes, numbness or tingling.  #CHRONIC HTN: Blood pressure currently well controlled.   Current Meds -currently taking lisinopril, Coreg, chlorthalidone Reports good compliance, took meds today. Tolerating well, w/o complaints. Denies CP, dyspnea,  dizziness / lightheadedness  # HLD /Hx of CVA -Currently doing well is following with physical medicine and rehab for Botox shots to help with spastic hemiplegia.  -Atorvastatin and Plavix -Patient did have a fall about a month ago though denies any focal neurologic deficiencies during that event.  He was seen at the ED for this issue -Patient would like to try Glucerna shakes, is actually interested in Osmolite nutritional drinks however given he has diabetes recommended Glucerna as this will provide another source of nutrition for him  Past Medical History Reviewed problem list.  Medications- reviewed and updated No additions to family history Social history- patient is a non-smoker  Objective: BP 101/70 (BP Location: Left Arm, Patient Position: Sitting, Cuff Size: Normal)   Pulse 71   Temp 98.1 F (36.7 C) (Oral)   Ht _0  (1.702 m)   Wt 240 lb 6.4 oz (109 kg)   SpO2 98%   BMI 37.65 kg/m  Gen: NAD, alert,  cooperative with exam Cardio: regular rate and rhythm, S1S2 heard, no murmurs appreciated Pulm: clear to auscultation bilaterally, no wheezes, rhonchi or rales Skin: dry, intact, no rashes or lesions  Assessment/Plan: See problem based a/p  Stage 2 chronic kidney disease Patient was seen in the ED in the beginning of January for a fall.  Lab work was notable for an elevated serum creatinine baseline usually around 1-1.2 Will check lab work today with a be met  Hyperlipidemia Currently on atorvastatin 40 mg Will need a lipid panel  Diabetes mellitus, type II (HCC) A1c 6.6 today, goal 7.5.  Reports having once weekly blood sugar lows at night -Decrease Levemir to 15 mg at night and 20 mg in the morning.  Continue current regiment of NovoLog 15 mg daily with meals-patient usually has a lunch and dinner does not have any breakfast  -Explained to patient that he should give me a call if blood sugars are still dropping following the current regiment -Follow-up in 1 month -Discussed with patient that he needs an eye exam this year -Will do diabetic foot exam at next visit  Essential hypertension, benign Blood pressure on the lower side today.  Denies any orthostatic hypotension.  However did have a fall 1 month ago -Will decrease blood pressure medication regiment -Stop amlodipine -Continue lisinopril, Coreg, and chlorthalidone -Be met today  Cerebrovascular accident (CVA) ([place]) Currently doing well. Continue Plavix and atorvastatin Lipid panel Still following with physical medicine and rehab for Botox injections to help with spasticity  Interested  in taking supplemental nutritional shakes, provided a prescription for Glucerna   Depression Refilled Zoloft today for 52-monthsupply Will evaluate at next visit

## 2017-07-14 NOTE — Assessment & Plan Note (Signed)
Blood pressure on the lower side today.  Denies any orthostatic hypotension.  However did have a fall 1 month ago -Will decrease blood pressure medication regiment -Stop amlodipine -Continue lisinopril, Coreg, and chlorthalidone -Be met today

## 2017-07-15 ENCOUNTER — Encounter: Payer: Self-pay | Admitting: Internal Medicine

## 2017-07-15 LAB — LIPID PANEL
CHOL/HDL RATIO: 2.3 ratio (ref 0.0–5.0)
Cholesterol, Total: 112 mg/dL (ref 100–199)
HDL: 48 mg/dL (ref >39)
LDL CALC: 52 mg/dL (ref 0–99)
Triglycerides: 62 mg/dL (ref 0–149)
VLDL CHOLESTEROL CAL: 12 mg/dL (ref 5–40)

## 2017-07-15 LAB — BASIC METABOLIC PANEL
BUN/Creatinine Ratio: 23 (ref 10–24)
BUN: 22 mg/dL (ref 8–27)
CO2: 23 mmol/L (ref 20–29)
Calcium: 8.9 mg/dL (ref 8.6–10.2)
Chloride: 102 mmol/L (ref 96–106)
Creatinine, Ser: 0.95 mg/dL (ref 0.76–1.27)
GFR calc Af Amer: 96 mL/min/{1.73_m2} (ref >59)
GFR calc non Af Amer: 83 mL/min/{1.73_m2} (ref >59)
GLUCOSE: 176 mg/dL — AB (ref 65–99)
POTASSIUM: 3.9 mmol/L (ref 3.5–5.2)
SODIUM: 140 mmol/L (ref 134–144)

## 2017-07-23 ENCOUNTER — Other Ambulatory Visit: Payer: Self-pay | Admitting: *Deleted

## 2017-07-24 ENCOUNTER — Ambulatory Visit: Payer: Medicare HMO | Admitting: Physical Medicine & Rehabilitation

## 2017-07-24 MED ORDER — INSULIN ASPART 100 UNIT/ML FLEXPEN: 15.0000 [IU] | PEN_INJECTOR | SUBCUTANEOUS | 3 refills | Status: DC

## 2017-08-03 ENCOUNTER — Encounter: Payer: Medicare HMO | Attending: Physical Medicine & Rehabilitation

## 2017-08-03 ENCOUNTER — Encounter: Payer: Self-pay | Admitting: Physical Medicine & Rehabilitation

## 2017-08-03 ENCOUNTER — Ambulatory Visit: Payer: Medicare HMO | Admitting: Physical Medicine & Rehabilitation

## 2017-08-03 VITALS — BP 123/76 | HR 63 | Resp 14

## 2017-08-03 DIAGNOSIS — N172 Acute kidney failure with medullary necrosis: Secondary | ICD-10-CM | POA: Insufficient documentation

## 2017-08-03 DIAGNOSIS — I69322 Dysarthria following cerebral infarction: Secondary | ICD-10-CM | POA: Insufficient documentation

## 2017-08-03 DIAGNOSIS — Z833 Family history of diabetes mellitus: Secondary | ICD-10-CM | POA: Insufficient documentation

## 2017-08-03 DIAGNOSIS — G8110 Spastic hemiplegia affecting unspecified side: Secondary | ICD-10-CM | POA: Diagnosis not present

## 2017-08-03 DIAGNOSIS — I739 Peripheral vascular disease, unspecified: Secondary | ICD-10-CM | POA: Insufficient documentation

## 2017-08-03 DIAGNOSIS — E1122 Type 2 diabetes mellitus with diabetic chronic kidney disease: Secondary | ICD-10-CM | POA: Diagnosis not present

## 2017-08-03 DIAGNOSIS — Z794 Long term (current) use of insulin: Secondary | ICD-10-CM | POA: Insufficient documentation

## 2017-08-03 DIAGNOSIS — I129 Hypertensive chronic kidney disease with stage 1 through stage 4 chronic kidney disease, or unspecified chronic kidney disease: Secondary | ICD-10-CM | POA: Insufficient documentation

## 2017-08-03 DIAGNOSIS — G8111 Spastic hemiplegia affecting right dominant side: Secondary | ICD-10-CM | POA: Insufficient documentation

## 2017-08-03 DIAGNOSIS — N189 Chronic kidney disease, unspecified: Secondary | ICD-10-CM | POA: Diagnosis not present

## 2017-08-03 DIAGNOSIS — Z86718 Personal history of other venous thrombosis and embolism: Secondary | ICD-10-CM | POA: Diagnosis not present

## 2017-08-03 DIAGNOSIS — Z8249 Family history of ischemic heart disease and other diseases of the circulatory system: Secondary | ICD-10-CM | POA: Diagnosis not present

## 2017-08-03 DIAGNOSIS — T8611 Kidney transplant rejection: Secondary | ICD-10-CM | POA: Diagnosis not present

## 2017-08-03 NOTE — Patient Instructions (Signed)

## 2017-08-03 NOTE — Progress Notes (Signed)
Dysport Injection for spasticity using needle EMG guidance  Dilution: 200 Units/ml Indication: Severe spasticity which interferes with ADL,mobility and/or  hygiene and is unresponsive to medication management and other conservative care Informed consent was obtained after describing risks and benefits of the procedure with the patient. This includes bleeding, bruising, infection, excessive weakness, or medication side effects. A REMS form is on file and signed. Needle:  needle electrode Number of units per muscle FCR, 300 units. FDS 300 units. FDP 200 Pronator teres 100 units. PQ, 100 units All injections were done after obtaining appropriate EMG activity and after negative drawback for blood. The patient tolerated the procedure well. Post procedure instructions were given. A followup appointment was made.

## 2017-08-11 ENCOUNTER — Ambulatory Visit (INDEPENDENT_AMBULATORY_CARE_PROVIDER_SITE_OTHER): Payer: Medicare HMO | Admitting: Internal Medicine

## 2017-08-11 ENCOUNTER — Encounter: Payer: Self-pay | Admitting: Internal Medicine

## 2017-08-11 VITALS — BP 118/65 | HR 66 | Temp 98.1°F | Ht 67.0 in | Wt 237.4 lb

## 2017-08-11 DIAGNOSIS — F329 Major depressive disorder, single episode, unspecified: Secondary | ICD-10-CM | POA: Diagnosis not present

## 2017-08-11 DIAGNOSIS — I10 Essential (primary) hypertension: Secondary | ICD-10-CM

## 2017-08-11 DIAGNOSIS — I63522 Cerebral infarction due to unspecified occlusion or stenosis of left anterior cerebral artery: Secondary | ICD-10-CM

## 2017-08-11 DIAGNOSIS — E1159 Type 2 diabetes mellitus with other circulatory complications: Secondary | ICD-10-CM | POA: Diagnosis not present

## 2017-08-11 DIAGNOSIS — R69 Illness, unspecified: Secondary | ICD-10-CM | POA: Diagnosis not present

## 2017-08-11 DIAGNOSIS — Z794 Long term (current) use of insulin: Secondary | ICD-10-CM | POA: Diagnosis not present

## 2017-08-11 DIAGNOSIS — E782 Mixed hyperlipidemia: Secondary | ICD-10-CM | POA: Diagnosis not present

## 2017-08-11 DIAGNOSIS — F32A Depression, unspecified: Secondary | ICD-10-CM

## 2017-08-11 MED ORDER — LIRAGLUTIDE 18 MG/3ML ~~LOC~~ SOPN: 0.6000 mg | PEN_INJECTOR | Freq: Every morning | SUBCUTANEOUS | 0 refills | Status: DC

## 2017-08-11 MED ORDER — GLUCERNA ADVANCE SHAKE PO LIQD: 1.0000 | Freq: Every day | ORAL | 2 refills | Status: DC

## 2017-08-11 NOTE — Progress Notes (Signed)
   [name] [name], MD Phone: [phone]  Reason For Visit: Follow up   #CHRONIC DM, Type 2: Patient denies having any lows since I decreased his medication. CBGs: Avg blood sugars have been 168. Meds: Currently taking Levemir 20 mg in the morning and 15 mg at night.  Currently taking NovoLog 15 mg daily with meals  Any hypoglycemia episodes: Denies palpations, diaphoresis, fatigue, weakness, jittery Denies polyuria, visual changes, numbness or tingling.  #CHRONIC HTN: His been checking his blood blood pressures at home. have been systolic blood pressures of 150s over 70s, sometimes 120s over 70 Current Meds -recently decrease his blood pressure regimen.  Currently taking lisinopril Coreg and chlorthalidone. Reports good compliance, took meds today. Tolerating well, w/o complaints. Denies CP, dyspnea, HA, edema, dizziness / lightheadedness  Past Medical History Reviewed problem list.  Medications- reviewed and updated No additions to family history Social history- patient is a non -smoker  Objective: BP 118/65 (BP Location: Left Arm, Patient Position: Sitting, Cuff Size: Large)   Pulse 66   Temp 98.1 F (36.7 C) (Oral)   Ht 5\' 7"  (1.702 m)   Wt 237 lb 6.4 oz (107.7 kg)   SpO2 98%   BMI 37.18 kg/m  Gen: NAD, alert, cooperative with exam Cardio: regular rate and rhythm, S1S2 heard, no murmurs appreciated Pulm: clear to auscultation bilaterally, no wheezes, rhonchi or rales Skin: dry, intact, no rashes or lesions   Assessment/Plan: See problem based a/p  Diabetes mellitus, type II (HCC) A1c of 6.6, denies any blood sugar lows since decreasing Levemir  Given patient's general disability.  Will work to bring down or stop insulin as much as possible by utilizing GLP-1 and SGL2 inhibitors Continue metformin 1000 mg twice daily Decrease NovoLog to 5 units from 15 units Continue Levemir at 20 units in the morning and 15 units at night Start  Victoza at 0.6 mg and increased to 1.2 mg in 2 weeks  Patient to follow-up in 1 month -will increase Victoza to 1.8 mg daily and stop NovoLog completely Consider decreasing Levemir as possible and adding on a second oral agent depending on cost for patient Foot exam in the future  discussed with patient that he needs an eye exam  Benign essential HTN Well-controlled, recently stopped Norvasc. Indicates elevated blood pressures sometimes at home, however her but his blood pressures here are always on the lower side.  He is not sure of the accuracy of the home blood pressure machine he has been using -Will continue lisinopril, chlorthalidone, and Coreg

## 2017-08-11 NOTE — Patient Instructions (Addendum)
I want you to decrease your Novolog to 5 mg with meals and start 0.6 mg of victoza in morning. Please stop novolog altogether and increase Victoza to 1.2 mg at two weeks. If have blood sugars less than 70 please call the clinic.   Follow up with me in 1 month

## 2017-08-14 ENCOUNTER — Encounter: Payer: Self-pay | Admitting: Internal Medicine

## 2017-08-14 MED ORDER — INSULIN ASPART 100 UNIT/ML FLEXPEN: PEN_INJECTOR | SUBCUTANEOUS | 3 refills | Status: DC

## 2017-08-14 MED ORDER — LISINOPRIL 40 MG PO TABS: 40.0000 mg | ORAL_TABLET | Freq: Every day | ORAL | 3 refills | Status: DC

## 2017-08-14 MED ORDER — CLOPIDOGREL BISULFATE 75 MG PO TABS: ORAL_TABLET | ORAL | 6 refills | Status: DC

## 2017-08-14 MED ORDER — SERTRALINE HCL 100 MG PO TABS: 100.0000 mg | ORAL_TABLET | Freq: Every day | ORAL | 2 refills | Status: DC

## 2017-08-14 MED ORDER — INSULIN DETEMIR 100 UNIT/ML FLEXPEN: PEN_INJECTOR | SUBCUTANEOUS | 5 refills | Status: DC

## 2017-08-14 MED ORDER — ATORVASTATIN CALCIUM 40 MG PO TABS: 40.0000 mg | ORAL_TABLET | Freq: Every day | ORAL | 3 refills | Status: DC

## 2017-08-14 MED ORDER — CARVEDILOL 25 MG PO TABS: ORAL_TABLET | ORAL | 3 refills | Status: DC

## 2017-08-14 MED ORDER — CHLORTHALIDONE 25 MG PO TABS: 25.0000 mg | ORAL_TABLET | Freq: Every day | ORAL | 3 refills | Status: DC

## 2017-08-14 MED ORDER — METFORMIN HCL 1000 MG PO TABS: 1000.0000 mg | ORAL_TABLET | Freq: Two times a day (BID) | ORAL | 3 refills | Status: DC

## 2017-08-14 NOTE — Assessment & Plan Note (Signed)
Well-controlled, recently stopped Norvasc. Indicates elevated blood pressures sometimes at home, however her but his blood pressures here are always on the lower side.  He is not sure of the accuracy of the home blood pressure machine he has been using -Will continue lisinopril, chlorthalidone, and Coreg

## 2017-08-14 NOTE — Assessment & Plan Note (Addendum)
A1c of 6.6, denies any blood sugar lows since decreasing Levemir  Given patient's general disability.  Will work to bring down or stop insulin as much as possible by utilizing GLP-1 and SGL2 inhibitors Continue metformin 1000 mg twice daily Decrease NovoLog to 5 units from 15 units Continue Levemir at 20 units in the morning and 15 units at night Start Victoza at 0.6 mg and increased to 1.2 mg in 2 weeks  Patient to follow-up in 1 month -will increase Victoza to 1.8 mg daily and stop NovoLog completely Consider decreasing Levemir as possible and adding on a second oral agent depending on cost for patient Foot exam in the future  discussed with patient that he needs an eye exam

## 2017-09-14 ENCOUNTER — Encounter: Payer: Medicare HMO | Attending: Physical Medicine & Rehabilitation

## 2017-09-14 ENCOUNTER — Encounter: Payer: Self-pay | Admitting: Physical Medicine & Rehabilitation

## 2017-09-14 ENCOUNTER — Ambulatory Visit: Payer: Medicare HMO | Admitting: Physical Medicine & Rehabilitation

## 2017-09-14 ENCOUNTER — Ambulatory Visit: Payer: Medicare HMO | Admitting: Internal Medicine

## 2017-09-14 VITALS — BP 134/79 | HR 82 | Resp 14 | Ht 67.0 in | Wt 237.0 lb

## 2017-09-14 DIAGNOSIS — T8611 Kidney transplant rejection: Secondary | ICD-10-CM | POA: Diagnosis not present

## 2017-09-14 DIAGNOSIS — I69322 Dysarthria following cerebral infarction: Secondary | ICD-10-CM | POA: Insufficient documentation

## 2017-09-14 DIAGNOSIS — N189 Chronic kidney disease, unspecified: Secondary | ICD-10-CM | POA: Diagnosis not present

## 2017-09-14 DIAGNOSIS — Z86718 Personal history of other venous thrombosis and embolism: Secondary | ICD-10-CM | POA: Insufficient documentation

## 2017-09-14 DIAGNOSIS — G8111 Spastic hemiplegia affecting right dominant side: Secondary | ICD-10-CM | POA: Insufficient documentation

## 2017-09-14 DIAGNOSIS — I739 Peripheral vascular disease, unspecified: Secondary | ICD-10-CM | POA: Diagnosis not present

## 2017-09-14 DIAGNOSIS — G8110 Spastic hemiplegia affecting unspecified side: Secondary | ICD-10-CM

## 2017-09-14 DIAGNOSIS — I129 Hypertensive chronic kidney disease with stage 1 through stage 4 chronic kidney disease, or unspecified chronic kidney disease: Secondary | ICD-10-CM | POA: Diagnosis not present

## 2017-09-14 DIAGNOSIS — N172 Acute kidney failure with medullary necrosis: Secondary | ICD-10-CM | POA: Insufficient documentation

## 2017-09-14 DIAGNOSIS — Z8249 Family history of ischemic heart disease and other diseases of the circulatory system: Secondary | ICD-10-CM | POA: Insufficient documentation

## 2017-09-14 DIAGNOSIS — Z794 Long term (current) use of insulin: Secondary | ICD-10-CM | POA: Insufficient documentation

## 2017-09-14 DIAGNOSIS — Z833 Family history of diabetes mellitus: Secondary | ICD-10-CM | POA: Insufficient documentation

## 2017-09-14 DIAGNOSIS — E1122 Type 2 diabetes mellitus with diabetic chronic kidney disease: Secondary | ICD-10-CM | POA: Diagnosis not present

## 2017-09-14 NOTE — Progress Notes (Signed)
Subjective:    Patient ID: [name], male    DOB: [birth year], 67 y.o.   MRN: [id number]  HPI Dysport injection 6 wks ago, pt now able to press a handicap button to open doors to office Able to carry a food bag using Right hand  No post injection pain  FCR, 300 units. FDS 300 units. FDP 200 Pronator teres 100 units. PQ, 100 units  Not stretching consistently Using hand splint at noc  Pain Inventory Average Pain 0 Pain Right Now 0 My pain is no pain  In the last 24 hours, has pain interfered with the following? General activity 0 Relation with others 0 Enjoyment of life 0 What TIME of day is your pain at its worst? no pain Sleep (in general) Good  Pain is worse with: no pain Pain improves with: no pain Relief from Meds: no pain  Mobility walk with assistance use a cane  Function retired  Neuro/Psych trouble walking  Prior Studies Any changes since last visit?  no  Physicians involved in your care Any changes since last visit?  no   Family History  Problem Relation Age of Onset  . Diabetes Mother   . Heart attack Mother   . Hypertension Mother   . Diabetes Sister   . Hypertension Sister   . Stroke Sister        2017  . Hypertension Brother   . Hypertension Daughter   . Hypertension Son   . Colon cancer Neg Hx   . Esophageal cancer Neg Hx   . Stomach cancer Neg Hx   . Rectal cancer Neg Hx    Social History   Socioeconomic History  . Marital status: Divorced    Spouse name: Not on file  . Number of children: 3  . Years of education: 70  . Highest education level: Not on file  Occupational History  . Occupation: Disabled-security Veterinary surgeon: [employer]  . Financial resource strain: Not on file  . Food insecurity:    Worry: Not on file    Inability: Not on file  . Transportation needs:    Medical: Not on file    Non-medical: Not on file  Tobacco Use  . Smoking status: Never Smoker  . Smokeless  tobacco: Never Used  Substance and Sexual Activity  . Alcohol use: No  . Drug use: No  . Sexual activity: Never    Partners: Female  Lifestyle  . Physical activity:    Days per week: Not on file    Minutes per session: Not on file  . Stress: Not on file  Relationships  . Social connections:    Talks on phone: Not on file    Gets together: Not on file    Attends religious service: Not on file    Active member of club or organization: Not on file    Attends meetings of clubs or organizations: Not on file    Relationship status: Not on file  Other Topics Concern  . Not on file  Social History Narrative   Divorced, lives alone   2 sons, 1 daughter   Retired/disabled   No caffeine   12/24/2015   Past Surgical History:  Procedure Laterality Date  . COLONOSCOPY    . None     Past Medical History:  Diagnosis Date  . 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 CVA (cerebral infarction) 01/03/2008   MRI: Acute 1  x 1.5 cm infarction affecting the left side of the pons.  . Diabetes mellitus   . DVT (deep venous thrombosis) ([place])   . Hypertension   . PAD (peripheral artery disease) ([place])   . Stroke ([place])    2008   BP 134/79 (BP Location: Left Arm, Patient Position: Sitting, Cuff Size: Large)   Pulse 82   Resp 14   Ht 5\' 7"  (1.702 m)   Wt 237 lb (107.5 kg)   SpO2 97%   BMI 37.12 kg/m   Opioid Risk Score:   Fall Risk Score:  `1  Depression screen PHQ 2/9  Depression screen [facility] 2/9 08/11/2017 11/30/2015 11/05/2015 10/23/2015 09/25/2015 05/02/2014 05/09/2013  Decreased Interest 0 0 0 0 0 0 0  Down, Depressed, Hopeless 0 0 0 0 0 - 0  PHQ - 2 Score 0 0 0 0 0 0 0    Review of Systems  Constitutional: Negative.   HENT: Negative.   Eyes: Negative.   Respiratory: Negative.   Gastrointestinal: Negative.   Endocrine: Negative.   Genitourinary: Negative.   Musculoskeletal: Positive for gait problem.  Skin: Negative.   Allergic/Immunologic: Negative.     Psychiatric/Behavioral: Negative.   All other systems reviewed and are negative.      Objective:   Physical Exam  Constitutional: He is oriented to person, place, and time. He appears well-developed and well-nourished. No distress.  HENT:  Head: Normocephalic and atraumatic.  Eyes: Pupils are equal, round, and reactive to light. Conjunctivae and EOM are normal.  Musculoskeletal:  No pain with Right elbow or srist of finger PROM  Neg impingement sign in RIght shoulder   Neurological: He is alert and oriented to person, place, and time. Gait abnormal.  Uses double metal upright AFO and QC  MAS Right  3 at elbow flexors, 3 at finger flexor , 3-4 at pronators  Skin: He is not diaphoretic.  Psychiatric: He has a normal mood and affect.  Nursing note and vitals reviewed.  Expressive aphasia       Assessment & Plan:  1.  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 FDS 300 units. FDP 300 Biceps 100

## 2017-09-14 NOTE — Patient Instructions (Signed)
Will do another dysport injection in 6 wks  PLEASE REMEMBER TO STRETCH FINGERS AND WRIST ON RIGHT HAND

## 2017-09-25 ENCOUNTER — Encounter: Payer: Self-pay | Admitting: Internal Medicine

## 2017-09-25 ENCOUNTER — Other Ambulatory Visit: Payer: Self-pay

## 2017-09-25 ENCOUNTER — Ambulatory Visit (INDEPENDENT_AMBULATORY_CARE_PROVIDER_SITE_OTHER): Payer: Medicare HMO | Admitting: Internal Medicine

## 2017-09-25 DIAGNOSIS — M6250 Muscle wasting and atrophy, not elsewhere classified, unspecified site: Secondary | ICD-10-CM | POA: Diagnosis not present

## 2017-09-25 DIAGNOSIS — I10 Essential (primary) hypertension: Secondary | ICD-10-CM

## 2017-09-25 DIAGNOSIS — Z794 Long term (current) use of insulin: Secondary | ICD-10-CM

## 2017-09-25 DIAGNOSIS — E1159 Type 2 diabetes mellitus with other circulatory complications: Secondary | ICD-10-CM

## 2017-09-25 MED ORDER — LIRAGLUTIDE 18 MG/3ML ~~LOC~~ SOPN: 1.8000 mg | PEN_INJECTOR | Freq: Every morning | SUBCUTANEOUS | 0 refills | Status: DC

## 2017-09-25 MED ORDER — INSULIN DETEMIR 100 UNIT/ML FLEXPEN: PEN_INJECTOR | SUBCUTANEOUS | 5 refills | Status: DC

## 2017-09-25 MED ORDER — LIRAGLUTIDE 18 MG/3ML ~~LOC~~ SOPN: 1.8000 mg | PEN_INJECTOR | Freq: Every morning | SUBCUTANEOUS | 2 refills | Status: DC

## 2017-09-25 NOTE — Assessment & Plan Note (Signed)
BP, high normal Continue lisinopril, Coreg, Chlorthalidone

## 2017-09-25 NOTE — Patient Instructions (Signed)
I am going to increase your  Victoza to 1.8 mg   Want to decrease your Levemir to 15 units in morning and 15 units at night.  We will check your testosterone levels today and see if you would benefit from supplementation.  Follow-up with me in 1 month

## 2017-09-25 NOTE — Progress Notes (Signed)
   [name] [name], MD Phone: [phone]  Reason For Visit: Follow up    # CHRONIC DM, Type 2: Levemir 20 units in the morning and 15 units of bedtime Victoza 1.2 mg  Units  CBGs: 120s - 180s avg, 400 max when patient forgot to take Levemir, Min  88 Reports good compliance. Tolerating well w/o side-effects Currently on ACEi / ARB  Any hypoglycemia episodes: Denies palpations, diaphoresis, fatigue, weakness, jittery  #CHRONIC HTN: Current Meds - Continue lisinopril 40 mg and coeg  Reports good compliance, took meds today. Tolerating well, w/o complaints. Denies CP, dyspnea, HA, edema, dizziness / lightheadedness  #Testerone  Defiecency  Patient is concerned that he may be testosterone deficient. he states he works out and is not able to build his much muscle mass as he previously was able too. He would like his Testerone level to be rechecked   Past Medical History Reviewed problem list.  Medications- reviewed and updated No additions to family history Social history- patient is a non- smoker  Objective: BP (!) 142/78   Pulse 86   Temp 99.6 F (37.6 C) (Oral)   Ht 5\' 7"  (1.702 m)   Wt 219 lb 3.2 oz (99.4 kg)   SpO2 95%   BMI 34.33 kg/m  Gen: NAD, alert, cooperative with exam Cardio: regular rate and rhythm, S1S2 heard, no murmurs appreciated Pulm: clear to auscultation bilaterally, no wheezes, rhonchi or rales Extremities: warm, well perfused, No edema, cyanosis or clubbing;  Skin: dry, intact, no rashes or lesions  Assessment/Plan: See problem based a/p  Diabetes mellitus, type II (HCC) Will increase Victoza to 1.8 mg Decrease Levemir to 15 units in the morning and 15 units in the afternoon Stopped Novolog Completely  Follow up in 1 month to recheck A1C  Needs a foot exam, eye exam, and Pneumonia at next visit     Essential hypertension, benign BP, high normal Continue lisinopril, Coreg, Chlorthalidone   Muscle  wasting Concern about building muscle mass  Obtain Testerone level

## 2017-09-25 NOTE — Assessment & Plan Note (Addendum)
Will increase Victoza to 1.8 mg Decrease Levemir to 15 units in the morning and 15 units in the afternoon Stopped Novolog Completely  Follow up in 1 month to recheck A1C  Needs a foot exam, eye exam, and Pneumonia at next visit

## 2017-09-25 NOTE — Assessment & Plan Note (Signed)
Concern about building muscle mass  Obtain Testerone level

## 2017-09-26 LAB — TESTOSTERONE: TESTOSTERONE: 418 ng/dL (ref 264–916)

## 2017-10-06 DIAGNOSIS — E113293 Type 2 diabetes mellitus with mild nonproliferative diabetic retinopathy without macular edema, bilateral: Secondary | ICD-10-CM | POA: Diagnosis not present

## 2017-10-06 DIAGNOSIS — H25813 Combined forms of age-related cataract, bilateral: Secondary | ICD-10-CM | POA: Diagnosis not present

## 2017-10-27 ENCOUNTER — Encounter: Payer: Self-pay | Admitting: Internal Medicine

## 2017-10-27 ENCOUNTER — Ambulatory Visit (INDEPENDENT_AMBULATORY_CARE_PROVIDER_SITE_OTHER): Payer: Medicare HMO | Admitting: Internal Medicine

## 2017-10-27 VITALS — BP 121/80 | HR 84 | Temp 98.8°F | Wt 204.6 lb

## 2017-10-27 DIAGNOSIS — Z794 Long term (current) use of insulin: Secondary | ICD-10-CM

## 2017-10-27 DIAGNOSIS — Z23 Encounter for immunization: Secondary | ICD-10-CM

## 2017-10-27 DIAGNOSIS — B351 Tinea unguium: Secondary | ICD-10-CM

## 2017-10-27 DIAGNOSIS — I10 Essential (primary) hypertension: Secondary | ICD-10-CM | POA: Diagnosis not present

## 2017-10-27 DIAGNOSIS — E1159 Type 2 diabetes mellitus with other circulatory complications: Secondary | ICD-10-CM

## 2017-10-27 LAB — POCT GLYCOSYLATED HEMOGLOBIN (HGB A1C): HBA1C, POC (CONTROLLED DIABETIC RANGE): 6 % (ref 0.0–7.0)

## 2017-10-27 MED ORDER — TERBINAFINE HCL 250 MG PO TABS: 250.0000 mg | ORAL_TABLET | Freq: Every day | ORAL | 0 refills | Status: DC

## 2017-10-27 MED ORDER — INSULIN DETEMIR 100 UNIT/ML FLEXPEN: PEN_INJECTOR | SUBCUTANEOUS | 5 refills | Status: DC

## 2017-10-27 NOTE — Patient Instructions (Signed)
Please follow-up with me at the end of June and we will plan to take you off your insulin completely.  Please start taking terbinafine for a total of 6 weeks of treatment.

## 2017-10-27 NOTE — Progress Notes (Signed)
   [facility] [name], MD Phone: [phone]  Reason For Visit: Follow up   # CHRONIC DM, Type 2: Patient states he feels he has decreased appetite with the Victoza.  Otherwise he feels like he is doing really well on that medication. CBGs 120s -130s  Meds: Victoza 1.8 mg and Levemir 15 units BID  Reports good compliance. Tolerating well w/o side-effects Currently on ACEi / ARB  Any hypoglycemia episodes: Denies palpations, diaphoresis, fatigue, weakness, jittery Denies polyuria, visual changes, numbness or tingling.  #CHRONIC HTN: Current Meds -chlorthalidone, lisinopril Reports good compliance, took meds today. Tolerating well, w/o complaints. Denies CP, dyspnea, HA, edema, dizziness / lightheadedness  Past Medical History Reviewed problem list.  Medications- reviewed and updated No additions to family history Social history- patient is a non- smoker  Objective: BP 121/80 (BP Location: Left Arm, Patient Position: Sitting, Cuff Size: Normal)   Pulse 84   Temp 98.8 F (37.1 C) (Oral)   Wt 204 lb 9.6 oz (92.8 kg)   SpO2 98%   BMI 32.04 kg/m  Gen: NAD, alert, cooperative with exam Cardio: regular rate and rhythm, S1S2 heard, no murmurs appreciated Pulm: clear to auscultation bilaterally, no wheezes, rhonchi or rales Skin: Onychomycosis noted on toes . Diabetic Foot Exam - Simple   Simple Foot Form Diabetic Foot exam was performed with the following findings:  Yes 10/27/2017  3:01 PM  Visual Inspection No deformities, no ulcerations, no other skin breakdown bilaterally:  Yes Sensation Testing Intact to touch and monofilament testing bilaterally:  Yes Pulse Check Posterior Tibialis and Dorsalis pulse intact bilaterally:  Yes Comments Notable for significant onychomycosis and overgrown nails     Assessment/Plan: See problem based a/p  Diabetes mellitus, type II (HCC) A1C 6.0 - Patient did see opthamology about two weeks ago -  Diabetic foot and pneumonia shoot today - HgB A1c -Victoza 1.8 mg and  Levemir 5 units BID  - Will stop Levemir altogether at next visit  - Follow up in 1 month   Essential hypertension, benign Well controlled   Onychomycosis Notable for onychomycosis will treat with terbinafine Follow up with podiatry for nail cutting

## 2017-10-27 NOTE — Assessment & Plan Note (Addendum)
A1C 6.0 - Patient did see opthamology about two weeks ago - Diabetic foot and pneumonia shoot today - HgB A1c -Victoza 1.8 mg and  Levemir 5 units BID  - Will stop Levemir altogether at next visit  - Follow up in 1 month

## 2017-10-27 NOTE — Assessment & Plan Note (Signed)
Notable for onychomycosis will treat with terbinafine Follow up with podiatry for nail cutting

## 2017-10-27 NOTE — Assessment & Plan Note (Signed)
Well controlled 

## 2017-11-03 ENCOUNTER — Ambulatory Visit (HOSPITAL_BASED_OUTPATIENT_CLINIC_OR_DEPARTMENT_OTHER): Payer: Medicare HMO | Admitting: Physical Medicine & Rehabilitation

## 2017-11-03 ENCOUNTER — Other Ambulatory Visit: Payer: Self-pay

## 2017-11-03 ENCOUNTER — Encounter: Payer: Self-pay | Admitting: Physical Medicine & Rehabilitation

## 2017-11-03 ENCOUNTER — Encounter: Payer: Medicare HMO | Attending: Physical Medicine & Rehabilitation

## 2017-11-03 VITALS — BP 134/80 | HR 78 | Ht 67.0 in | Wt 206.6 lb

## 2017-11-03 DIAGNOSIS — Z8249 Family history of ischemic heart disease and other diseases of the circulatory system: Secondary | ICD-10-CM | POA: Insufficient documentation

## 2017-11-03 DIAGNOSIS — I679 Cerebrovascular disease, unspecified: Secondary | ICD-10-CM | POA: Diagnosis not present

## 2017-11-03 DIAGNOSIS — I739 Peripheral vascular disease, unspecified: Secondary | ICD-10-CM | POA: Insufficient documentation

## 2017-11-03 DIAGNOSIS — Z86718 Personal history of other venous thrombosis and embolism: Secondary | ICD-10-CM | POA: Insufficient documentation

## 2017-11-03 DIAGNOSIS — Z794 Long term (current) use of insulin: Secondary | ICD-10-CM | POA: Insufficient documentation

## 2017-11-03 DIAGNOSIS — N189 Chronic kidney disease, unspecified: Secondary | ICD-10-CM | POA: Diagnosis not present

## 2017-11-03 DIAGNOSIS — N172 Acute kidney failure with medullary necrosis: Secondary | ICD-10-CM | POA: Insufficient documentation

## 2017-11-03 DIAGNOSIS — G8111 Spastic hemiplegia affecting right dominant side: Secondary | ICD-10-CM | POA: Insufficient documentation

## 2017-11-03 DIAGNOSIS — E1122 Type 2 diabetes mellitus with diabetic chronic kidney disease: Secondary | ICD-10-CM | POA: Insufficient documentation

## 2017-11-03 DIAGNOSIS — I69322 Dysarthria following cerebral infarction: Secondary | ICD-10-CM | POA: Diagnosis not present

## 2017-11-03 DIAGNOSIS — I129 Hypertensive chronic kidney disease with stage 1 through stage 4 chronic kidney disease, or unspecified chronic kidney disease: Secondary | ICD-10-CM | POA: Insufficient documentation

## 2017-11-03 DIAGNOSIS — T8611 Kidney transplant rejection: Secondary | ICD-10-CM | POA: Diagnosis not present

## 2017-11-03 DIAGNOSIS — Z833 Family history of diabetes mellitus: Secondary | ICD-10-CM | POA: Insufficient documentation

## 2017-11-03 NOTE — Progress Notes (Signed)
Dysport Injection for spasticity using needle EMG guidance  Dilution: 200 Units/ml Indication: Severe spasticity which interferes with ADL,mobility and/or  hygiene and is unresponsive to medication management and other conservative care Informed consent was obtained after describing risks and benefits of the procedure with the patient. This includes bleeding, bruising, infection, excessive weakness, or medication side effects. A REMS form is on file and signed. Needle:  needle electrode Number of units per muscle FCR, 300 units. FDS 300 units. FDP 200 Pronator teres 100 units. PQ, 100 units All injections were done after obtaining appropriate EMG activity and after negative drawback for blood. The patient tolerated the procedure well. Post procedure instructions were given. A followup appointment was made.

## 2017-11-03 NOTE — Patient Instructions (Signed)

## 2017-11-24 ENCOUNTER — Other Ambulatory Visit: Payer: Self-pay | Admitting: *Deleted

## 2017-11-24 MED ORDER — LISINOPRIL 40 MG PO TABS: 40.0000 mg | ORAL_TABLET | Freq: Every day | ORAL | 3 refills | Status: DC

## 2017-11-25 ENCOUNTER — Encounter: Payer: Self-pay | Admitting: Internal Medicine

## 2017-11-25 ENCOUNTER — Ambulatory Visit (INDEPENDENT_AMBULATORY_CARE_PROVIDER_SITE_OTHER): Payer: Medicare HMO | Admitting: Internal Medicine

## 2017-11-25 VITALS — BP 101/80 | HR 83 | Temp 98.6°F | Ht 67.0 in | Wt 204.6 lb

## 2017-11-25 DIAGNOSIS — Z794 Long term (current) use of insulin: Secondary | ICD-10-CM

## 2017-11-25 DIAGNOSIS — E1159 Type 2 diabetes mellitus with other circulatory complications: Secondary | ICD-10-CM

## 2017-11-25 NOTE — Progress Notes (Signed)
   [name] [name], MD Phone: [phone]  Reason For Visit: Follow up   # CHRONIC DM, Type 2: CBG: Have been running the same,  CBG 114, nothing above 150  Meds: Vitcoza, Metformin, and levemir 5 units BID  Reports good compliance. Tolerating well w/o side-effects Currently on ACEi / ARB  Lifestyle: Appetite has improved, has continued a current does  Any hypoglycemia episodes: Denies palpations, diaphoresis, fatigue, weakness, jittery Eye exam by Dr. Johney Maine  Past Medical History Reviewed problem list.  Medications- reviewed and updated No additions to family history Social history- patient is a non-smoker  Objective: BP 101/80 (BP Location: Left Arm, Patient Position: Sitting, Cuff Size: Large)   Pulse 83   Temp 98.6 F (37 C) (Oral)   Ht 5\' 7"  (1.702 m)   Wt 204 lb 9.6 oz (92.8 kg)   SpO2 97%   BMI 32.04 kg/m  Gen: NAD, alert, cooperative with exam Cardio: regular rate and rhythm, S1S2 heard, no murmurs appreciated Pulm: clear to auscultation bilaterally, no wheezes, rhonchi or rales Skin: dry, intact, no rashes or lesions   Assessment/Plan: See problem based a/p  Diabetes mellitus, type II (HCC) Doing very well on current diabetes regiment -Will stop Levemir today -Continue Victoza 1.8 and metformin -Follow-up in 2 months for A1c check -Has had diabetic eye exam with Dr. Johney Maine -

## 2017-11-25 NOTE — Patient Instructions (Addendum)
Please completely stop the Levemir today. I am very happy I had the chance to care for you.

## 2017-11-25 NOTE — Assessment & Plan Note (Addendum)
Doing very well on current diabetes regiment -Will stop Levemir today -Continue Victoza 1.8 and metformin -Follow-up in 2 months for A1c check -Has had diabetic eye exam with Dr. Johney Maine -

## 2017-12-07 ENCOUNTER — Other Ambulatory Visit: Payer: Self-pay | Admitting: Nurse Practitioner

## 2017-12-14 ENCOUNTER — Ambulatory Visit: Payer: Medicare HMO | Admitting: Physical Medicine & Rehabilitation

## 2017-12-18 ENCOUNTER — Ambulatory Visit: Payer: Medicare HMO | Admitting: Physical Medicine & Rehabilitation

## 2017-12-18 ENCOUNTER — Ambulatory Visit: Payer: Medicare HMO

## 2017-12-22 ENCOUNTER — Encounter: Payer: Medicare HMO | Attending: Physical Medicine & Rehabilitation

## 2017-12-22 ENCOUNTER — Encounter: Payer: Self-pay | Admitting: Physical Medicine & Rehabilitation

## 2017-12-22 ENCOUNTER — Ambulatory Visit (HOSPITAL_BASED_OUTPATIENT_CLINIC_OR_DEPARTMENT_OTHER): Payer: Medicare HMO | Admitting: Physical Medicine & Rehabilitation

## 2017-12-22 VITALS — BP 164/85 | HR 67 | Ht 67.0 in | Wt 194.0 lb

## 2017-12-22 DIAGNOSIS — T8611 Kidney transplant rejection: Secondary | ICD-10-CM | POA: Insufficient documentation

## 2017-12-22 DIAGNOSIS — N172 Acute kidney failure with medullary necrosis: Secondary | ICD-10-CM | POA: Diagnosis not present

## 2017-12-22 DIAGNOSIS — Z8249 Family history of ischemic heart disease and other diseases of the circulatory system: Secondary | ICD-10-CM | POA: Diagnosis not present

## 2017-12-22 DIAGNOSIS — Z794 Long term (current) use of insulin: Secondary | ICD-10-CM | POA: Diagnosis not present

## 2017-12-22 DIAGNOSIS — Z86718 Personal history of other venous thrombosis and embolism: Secondary | ICD-10-CM | POA: Insufficient documentation

## 2017-12-22 DIAGNOSIS — I739 Peripheral vascular disease, unspecified: Secondary | ICD-10-CM | POA: Insufficient documentation

## 2017-12-22 DIAGNOSIS — E1122 Type 2 diabetes mellitus with diabetic chronic kidney disease: Secondary | ICD-10-CM | POA: Diagnosis not present

## 2017-12-22 DIAGNOSIS — Z833 Family history of diabetes mellitus: Secondary | ICD-10-CM | POA: Insufficient documentation

## 2017-12-22 DIAGNOSIS — I129 Hypertensive chronic kidney disease with stage 1 through stage 4 chronic kidney disease, or unspecified chronic kidney disease: Secondary | ICD-10-CM | POA: Insufficient documentation

## 2017-12-22 DIAGNOSIS — I69322 Dysarthria following cerebral infarction: Secondary | ICD-10-CM | POA: Diagnosis not present

## 2017-12-22 DIAGNOSIS — I679 Cerebrovascular disease, unspecified: Secondary | ICD-10-CM

## 2017-12-22 DIAGNOSIS — G8111 Spastic hemiplegia affecting right dominant side: Secondary | ICD-10-CM | POA: Insufficient documentation

## 2017-12-22 DIAGNOSIS — N189 Chronic kidney disease, unspecified: Secondary | ICD-10-CM | POA: Insufficient documentation

## 2017-12-22 NOTE — Progress Notes (Addendum)
Subjective:    Patient ID: [name], male    DOB: [birth year], 67 y.o.   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  He has had spasticity and interferes with hygiene as well as positioning of the right upper extremity. Dysport 11/03/17 FCR, 300 units. FDS 300 units. FDP 200 Pronator teres 100 units. PQ, 100 units The patient has had partial relief of his finger flexor spasticity but has difficulty extending his thumb as well as his fingers.  He also has difficulty supinating his arm. Pain Inventory Average Pain 0 Pain Right Now 0 My pain is na  In the last 24 hours, has pain interfered with the following? General activity 0 Relation with others 0 Enjoyment of life 0 What TIME of day is your pain at its worst? na Sleep (in general) Fair  Pain is worse with: na Pain improves with: na Relief from Meds: na  Mobility use a cane ability to climb steps?  yes do you drive?  no  Function retired  Neuro/Psych No problems in this area  Prior Studies Any changes since last visit?  no  Physicians involved in your care Any changes since last visit?  no   Family History  Problem Relation Age of Onset  . Diabetes Mother   . Heart attack Mother   . Hypertension Mother   . Diabetes Sister   . Hypertension Sister   . Stroke Sister        2017  . Hypertension Brother   . Hypertension Daughter   . Hypertension Son   . Colon cancer Neg Hx   . Esophageal cancer Neg Hx   . Stomach cancer Neg Hx   . Rectal cancer Neg Hx    Social History   Socioeconomic History  . Marital status: Divorced    Spouse name: Not on file  . Number of children: 3  . Years of education: 84  . Highest education level: Not on file  Occupational History  . Occupation: Disabled-security Veterinary surgeon: [employer]  . Financial resource strain: Not on file  . Food insecurity:    Worry: Not on file   Inability: Not on file  . Transportation needs:    Medical: Not on file    Non-medical: Not on file  Tobacco Use  . Smoking status: Never Smoker  . Smokeless tobacco: Never Used  Substance and Sexual Activity  . Alcohol use: No  . Drug use: No  . Sexual activity: Never    Partners: Female  Lifestyle  . Physical activity:    Days per week: Not on file    Minutes per session: Not on file  . Stress: Not on file  Relationships  . Social connections:    Talks on phone: Not on file    Gets together: Not on file    Attends religious service: Not on file    Active member of club or organization: Not on file    Attends meetings of clubs or organizations: Not on file    Relationship status: Not on file  Other Topics Concern  . Not on file  Social History Narrative   Divorced, lives alone   2 sons, 1 daughter   Retired/disabled   No caffeine   12/24/2015   Past Surgical History:  Procedure Laterality Date  . COLONOSCOPY    . None     Past Medical History:  Diagnosis Date  . 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 CVA (cerebral infarction) 01/03/2008   MRI: Acute 1 x 1.5 cm infarction affecting the left side of the pons.  . Diabetes mellitus   . DVT (deep venous thrombosis) ([place])   . Hypertension   . PAD (peripheral artery disease) ([place])   . Stroke [facility])    2008   There were no vitals taken for this visit.  Opioid Risk Score:   Fall Risk Score:  `1  Depression screen PHQ 2/9  Depression screen [facility] 2/9 11/03/2017 09/25/2017 08/11/2017 11/30/2015 11/05/2015 10/23/2015 09/25/2015  Decreased Interest 0 0 0 0 0 0 0  Down, Depressed, Hopeless 0 0 0 0 0 0 0  PHQ - 2 Score 0 0 0 0 0 0 0     Review of Systems  Constitutional: Negative.   HENT: Negative.   Eyes: Negative.   Respiratory: Negative.   Cardiovascular: Negative.   Gastrointestinal: Negative.   Endocrine: Negative.   Genitourinary: Negative.   Musculoskeletal: Positive for gait problem and myalgias.    Skin: Negative.   Allergic/Immunologic: Negative.   Hematological: Negative.   Psychiatric/Behavioral: Negative.   All other systems reviewed and are negative.      Objective:   Physical Exam  Constitutional: He appears well-developed and well-nourished.  HENT:  Head: Normocephalic and atraumatic.  Eyes: Pupils are equal, round, and reactive to light. EOM are normal.  Nursing note and vitals reviewed. Tone MAS 3 in right finger flexors MAS 0 in the right wrist flexors MAS 2 in the right elbow flexors MAS 3 in the right thumb flexor MAS 3 in the pronators on the right side Motor strength 2- finger flexors 0 finger extensors trace thumb extensor trace wrist flexor extensor, 2- elbow flexor extensor 3- deltoid with abduction and flexion. Ambulates with a cane as well as right double metal upright AFO        Assessment & Plan:  1.  Left MCA distribution infarct with right hemiparesis and spasticity.  He has very difficult to manage spasticity requiring high-dose of botulinum toxin.  As discussed with the patient would recommend increasing his Dysport dose to a total of 1500 units.  His weight is approximately 200 pounds  FCR, 300 units. FDS 300 units. FDP 300 Pronator teres 200 units. PQ, 200 units FPL 200  We will perform injection in approximately 6 weeks

## 2017-12-22 NOTE — Patient Instructions (Signed)
Will increase dysport dose for next injection

## 2017-12-27 ENCOUNTER — Other Ambulatory Visit: Payer: Self-pay | Admitting: Internal Medicine

## 2017-12-27 DIAGNOSIS — B351 Tinea unguium: Secondary | ICD-10-CM

## 2017-12-28 ENCOUNTER — Other Ambulatory Visit: Payer: Self-pay

## 2017-12-28 DIAGNOSIS — B351 Tinea unguium: Secondary | ICD-10-CM

## 2017-12-28 MED ORDER — TERBINAFINE HCL 250 MG PO TABS: 250.0000 mg | ORAL_TABLET | Freq: Every day | ORAL | 0 refills | Status: DC

## 2017-12-30 ENCOUNTER — Other Ambulatory Visit: Payer: Self-pay | Admitting: *Deleted

## 2017-12-30 MED ORDER — CARVEDILOL 25 MG PO TABS: ORAL_TABLET | ORAL | 3 refills | Status: DC

## 2018-01-14 ENCOUNTER — Other Ambulatory Visit: Payer: Self-pay | Admitting: Nurse Practitioner

## 2018-01-27 ENCOUNTER — Other Ambulatory Visit: Payer: Self-pay

## 2018-01-27 ENCOUNTER — Ambulatory Visit (INDEPENDENT_AMBULATORY_CARE_PROVIDER_SITE_OTHER): Payer: Medicare HMO | Admitting: Family Medicine

## 2018-01-27 ENCOUNTER — Encounter: Payer: Self-pay | Admitting: Family Medicine

## 2018-01-27 VITALS — BP 116/72 | HR 81 | Temp 98.2°F | Ht 67.0 in | Wt 193.6 lb

## 2018-01-27 DIAGNOSIS — E782 Mixed hyperlipidemia: Secondary | ICD-10-CM | POA: Diagnosis not present

## 2018-01-27 DIAGNOSIS — Z794 Long term (current) use of insulin: Secondary | ICD-10-CM

## 2018-01-27 DIAGNOSIS — I63522 Cerebral infarction due to unspecified occlusion or stenosis of left anterior cerebral artery: Secondary | ICD-10-CM | POA: Diagnosis not present

## 2018-01-27 DIAGNOSIS — F329 Major depressive disorder, single episode, unspecified: Secondary | ICD-10-CM | POA: Diagnosis not present

## 2018-01-27 DIAGNOSIS — E1159 Type 2 diabetes mellitus with other circulatory complications: Secondary | ICD-10-CM | POA: Diagnosis not present

## 2018-01-27 DIAGNOSIS — R69 Illness, unspecified: Secondary | ICD-10-CM | POA: Diagnosis not present

## 2018-01-27 DIAGNOSIS — F32A Depression, unspecified: Secondary | ICD-10-CM

## 2018-01-27 LAB — POCT GLYCOSYLATED HEMOGLOBIN (HGB A1C): HbA1c, POC (controlled diabetic range): 5.7 % (ref 0.0–7.0)

## 2018-01-27 MED ORDER — ATORVASTATIN CALCIUM 40 MG PO TABS: 40.0000 mg | ORAL_TABLET | Freq: Every day | ORAL | 3 refills | Status: DC

## 2018-01-27 MED ORDER — GLUCERNA ADVANCE SHAKE PO LIQD: 1.0000 | Freq: Every day | ORAL | 2 refills | Status: DC

## 2018-01-27 MED ORDER — LISINOPRIL 40 MG PO TABS: 40.0000 mg | ORAL_TABLET | Freq: Every day | ORAL | 3 refills | Status: DC

## 2018-01-27 MED ORDER — CLOPIDOGREL BISULFATE 75 MG PO TABS: ORAL_TABLET | ORAL | 6 refills | Status: DC

## 2018-01-27 MED ORDER — SERTRALINE HCL 100 MG PO TABS: 100.0000 mg | ORAL_TABLET | Freq: Every day | ORAL | 2 refills | Status: DC

## 2018-01-27 MED ORDER — METFORMIN HCL 1000 MG PO TABS: 1000.0000 mg | ORAL_TABLET | Freq: Two times a day (BID) | ORAL | 3 refills | Status: DC

## 2018-01-27 MED ORDER — CHLORTHALIDONE 25 MG PO TABS: 25.0000 mg | ORAL_TABLET | Freq: Every day | ORAL | 3 refills | Status: DC

## 2018-01-27 MED ORDER — CARVEDILOL 25 MG PO TABS: ORAL_TABLET | ORAL | 3 refills | Status: DC

## 2018-01-27 NOTE — Patient Instructions (Signed)
It was great to meet you today! Thank you for letting me participate in your care!  Today, we discussed your diabetes type 2. You are doing very well and are below the goal of an A1c of less than 7. You are currently at 5.7, which is an average of your blood sugar over the last 3 months. I am stopping your Victoza today. Please continue taking Metformin and maintaining your healthy diet and lifestyle. I will see you back in clinic in 3 months.  I have refilled your regular medications. Please don't hesitate to call me if you need anything else.  Be well, Harolyn Rutherford, DO PGY-2, [facility]

## 2018-01-27 NOTE — Progress Notes (Signed)
Subjective: Chief Complaint  Patient presents with  . Follow-up    a1c, meet new pcp   HPI: [name] is a 67 y.o. presenting to clinic today to discuss the following:  DM Follow Up Patient presents today for follow up for this type 2 diabetes. He states he is doing well and is here for follow up of his diabetes. His glucometer readings for the past month ranges from 98-170 with an average of around 125. He denies any episodes of hypoglycemia and has stopped taking insulin.  He denies any dizziness, confusion, syncope or near syncope, abdominal pain, nausea, vomiting, or diarrhea.  Health Maintenance: completed eye exam with Dr. Johney Maine     ROS noted in HPI.   Past Medical, Surgical, Social, and Family History Reviewed & Updated per EMR.   Pertinent Historical Findings include:   Social History   Tobacco Use  Smoking Status Never Smoker  Smokeless Tobacco Never Used    Objective: BP 116/72   Pulse 81   Temp 98.2 F (36.8 C) (Oral)   Ht 5\' 7"  (1.702 m)   Wt 193 lb 9.6 oz (87.8 kg)   SpO2 98%   BMI 30.32 kg/m  Vitals and nursing notes reviewed  Physical Exam Gen: Alert and Oriented x 3, NAD HEENT: Normocephalic, atraumatic, PERRLA, EOMI CV: RRR, no murmurs, normal S1, S2 split Resp: CTAB, no wheezing, rales, or rhonchi, comfortable work of breathing Abd: non-distended, non-tender, soft, +bs in all four quadrants Ext: no clubbing, cyanosis, or edema Skin: warm, dry, intact, no rashes  Results for orders placed or performed in visit on 01/27/18 (from the past 72 hour(s))  HgB A1c     Status: None   Collection Time: 01/27/18  1:30 PM  Result Value Ref Range   Hemoglobin A1C     HbA1c POC (<> result, manual entry)     HbA1c, POC (prediabetic range)     HbA1c, POC (controlled diabetic range) 5.7 0.0 - 7.0 %    Assessment/Plan:  Diabetes mellitus, type II ([place]) Still doing well on diabetes regimen since stopping insulin with A1c at 5.7 - Stop  Victoza today, continue Metformin 1000mg  daily BID - Follow up in 3 months for DM check - Refilled Glucerna and gave samples and coupons to help with cost  PATIENT EDUCATION PROVIDED: See AVS    Diagnosis and plan along with any newly prescribed medication(s) were discussed in detail with this patient today. The patient verbalized understanding and agreed with the plan. Patient advised if symptoms worsen return to clinic or ER.   Health Maintainance:   Orders Placed This Encounter  Procedures  . HgB A1c    Meds ordered this encounter  Medications  . atorvastatin (LIPITOR) 40 MG tablet    Sig: Take 1 tablet (40 mg total) by mouth daily.    Dispense:  90 tablet    Refill:  3  . carvedilol (COREG) 25 MG tablet    Sig: TAKE 1 TABLET BY MOUTH TWICE A DAY WITH A MEAL    Dispense:  120 tablet    Refill:  3  . chlorthalidone (HYGROTON) 25 MG tablet    Sig: Take 1 tablet (25 mg total) by mouth daily with lunch.    Dispense:  90 tablet    Refill:  3  . clopidogrel (PLAVIX) 75 MG tablet    Sig: TAKE 1 TABLET (75 MG TOTAL) BY MOUTH DAILY.    Dispense:  30 tablet  Refill:  6  . lisinopril (PRINIVIL,ZESTRIL) 40 MG tablet    Sig: Take 1 tablet (40 mg total) by mouth daily.    Dispense:  90 tablet    Refill:  3  . metFORMIN (GLUCOPHAGE) 1000 MG tablet    Sig: Take 1 tablet (1,000 mg total) by mouth 2 (two) times daily with a meal.    Dispense:  180 tablet    Refill:  3  . sertraline (ZOLOFT) 100 MG tablet    Sig: Take 1 tablet (100 mg total) by mouth daily.    Dispense:  90 tablet    Refill:  2  . Nutritional Supplements (GLUCERNA ADVANCE SHAKE) LIQD    Sig: Take 1 Can by mouth daily.    Dispense:  30 Bottle    Refill:  [place], DO 01/27/2018, 1:28 PM PGY-2 [place]

## 2018-01-27 NOTE — Assessment & Plan Note (Signed)
Still doing well on diabetes regimen since stopping insulin with A1c at 5.7 - Stop Victoza today, continue Metformin 1000mg  daily BID - Follow up in 3 months for DM check - Refilled Glucerna and gave samples and coupons to help with cost

## 2018-02-04 ENCOUNTER — Encounter: Payer: Medicare HMO | Attending: Physical Medicine & Rehabilitation

## 2018-02-04 ENCOUNTER — Ambulatory Visit (HOSPITAL_BASED_OUTPATIENT_CLINIC_OR_DEPARTMENT_OTHER): Payer: Medicare HMO | Admitting: Physical Medicine & Rehabilitation

## 2018-02-04 ENCOUNTER — Encounter: Payer: Self-pay | Admitting: Physical Medicine & Rehabilitation

## 2018-02-04 VITALS — BP 116/69 | HR 68 | Ht 67.0 in | Wt 193.0 lb

## 2018-02-04 DIAGNOSIS — G8111 Spastic hemiplegia affecting right dominant side: Secondary | ICD-10-CM | POA: Insufficient documentation

## 2018-02-04 DIAGNOSIS — N172 Acute kidney failure with medullary necrosis: Secondary | ICD-10-CM | POA: Diagnosis not present

## 2018-02-04 DIAGNOSIS — I679 Cerebrovascular disease, unspecified: Secondary | ICD-10-CM

## 2018-02-04 DIAGNOSIS — I69322 Dysarthria following cerebral infarction: Secondary | ICD-10-CM | POA: Diagnosis not present

## 2018-02-04 DIAGNOSIS — N189 Chronic kidney disease, unspecified: Secondary | ICD-10-CM | POA: Diagnosis not present

## 2018-02-04 DIAGNOSIS — Z86718 Personal history of other venous thrombosis and embolism: Secondary | ICD-10-CM | POA: Diagnosis not present

## 2018-02-04 DIAGNOSIS — E1122 Type 2 diabetes mellitus with diabetic chronic kidney disease: Secondary | ICD-10-CM | POA: Diagnosis not present

## 2018-02-04 DIAGNOSIS — G8110 Spastic hemiplegia affecting unspecified side: Secondary | ICD-10-CM | POA: Diagnosis not present

## 2018-02-04 DIAGNOSIS — Z794 Long term (current) use of insulin: Secondary | ICD-10-CM | POA: Diagnosis not present

## 2018-02-04 DIAGNOSIS — I129 Hypertensive chronic kidney disease with stage 1 through stage 4 chronic kidney disease, or unspecified chronic kidney disease: Secondary | ICD-10-CM | POA: Diagnosis not present

## 2018-02-04 DIAGNOSIS — I739 Peripheral vascular disease, unspecified: Secondary | ICD-10-CM | POA: Insufficient documentation

## 2018-02-04 DIAGNOSIS — Z833 Family history of diabetes mellitus: Secondary | ICD-10-CM | POA: Insufficient documentation

## 2018-02-04 DIAGNOSIS — T8611 Kidney transplant rejection: Secondary | ICD-10-CM | POA: Insufficient documentation

## 2018-02-04 DIAGNOSIS — Z8249 Family history of ischemic heart disease and other diseases of the circulatory system: Secondary | ICD-10-CM | POA: Diagnosis not present

## 2018-02-04 NOTE — Progress Notes (Signed)
Dysport Injection for spasticity using needle EMG guidance  Dilution: 200 Units/ml Indication: Severe spasticity which interferes with ADL,mobility and/or  hygiene and is unresponsive to medication management and other conservative care Informed consent was obtained after describing risks and benefits of the procedure with the patient. This includes bleeding, bruising, infection, excessive weakness, or medication side effects. A REMS form is on file and signed. Needle:  needle electrode Number of units per muscle FCR, 300 units. FDS 300 units. FDP 300 Pronator teres 200 units. PQ, 200 units FPL 200 All injections were done after obtaining appropriate EMG activity and after negative drawback for blood. The patient tolerated the procedure well. Post procedure instructions were given. A followup appointment was made.  

## 2018-02-04 NOTE — Patient Instructions (Signed)

## 2018-02-11 ENCOUNTER — Other Ambulatory Visit: Payer: Self-pay

## 2018-02-11 DIAGNOSIS — Z794 Long term (current) use of insulin: Secondary | ICD-10-CM

## 2018-02-11 DIAGNOSIS — E1159 Type 2 diabetes mellitus with other circulatory complications: Secondary | ICD-10-CM

## 2018-02-11 MED ORDER — LIRAGLUTIDE 18 MG/3ML ~~LOC~~ SOPN: 1.8000 mg | PEN_INJECTOR | Freq: Every morning | SUBCUTANEOUS | 2 refills | Status: DC

## 2018-02-17 ENCOUNTER — Telehealth: Payer: Self-pay

## 2018-02-17 NOTE — Telephone Encounter (Signed)
Clinical Pharmacy working with patients insurance called nurse line for a med rec for patient. Medications given and clarified.

## 2018-03-07 ENCOUNTER — Other Ambulatory Visit: Payer: Self-pay | Admitting: Family Medicine

## 2018-03-07 DIAGNOSIS — B351 Tinea unguium: Secondary | ICD-10-CM

## 2018-03-22 ENCOUNTER — Ambulatory Visit: Payer: Medicare HMO | Admitting: Physical Medicine & Rehabilitation

## 2018-04-06 ENCOUNTER — Other Ambulatory Visit: Payer: Self-pay | Admitting: *Deleted

## 2018-04-06 ENCOUNTER — Encounter: Payer: Self-pay | Admitting: Physical Medicine & Rehabilitation

## 2018-04-06 ENCOUNTER — Ambulatory Visit: Payer: Medicare HMO | Admitting: Physical Medicine & Rehabilitation

## 2018-04-06 ENCOUNTER — Encounter: Payer: Medicare HMO | Attending: Physical Medicine & Rehabilitation

## 2018-04-06 ENCOUNTER — Other Ambulatory Visit: Payer: Self-pay

## 2018-04-06 VITALS — BP 153/66 | HR 70 | Ht 67.0 in | Wt 199.6 lb

## 2018-04-06 DIAGNOSIS — Z794 Long term (current) use of insulin: Secondary | ICD-10-CM | POA: Diagnosis not present

## 2018-04-06 DIAGNOSIS — I69322 Dysarthria following cerebral infarction: Secondary | ICD-10-CM | POA: Insufficient documentation

## 2018-04-06 DIAGNOSIS — E1122 Type 2 diabetes mellitus with diabetic chronic kidney disease: Secondary | ICD-10-CM | POA: Diagnosis not present

## 2018-04-06 DIAGNOSIS — Z86718 Personal history of other venous thrombosis and embolism: Secondary | ICD-10-CM | POA: Insufficient documentation

## 2018-04-06 DIAGNOSIS — G8110 Spastic hemiplegia affecting unspecified side: Secondary | ICD-10-CM | POA: Diagnosis not present

## 2018-04-06 DIAGNOSIS — I739 Peripheral vascular disease, unspecified: Secondary | ICD-10-CM | POA: Insufficient documentation

## 2018-04-06 DIAGNOSIS — Z833 Family history of diabetes mellitus: Secondary | ICD-10-CM | POA: Insufficient documentation

## 2018-04-06 DIAGNOSIS — T8611 Kidney transplant rejection: Secondary | ICD-10-CM | POA: Diagnosis not present

## 2018-04-06 DIAGNOSIS — I129 Hypertensive chronic kidney disease with stage 1 through stage 4 chronic kidney disease, or unspecified chronic kidney disease: Secondary | ICD-10-CM | POA: Diagnosis not present

## 2018-04-06 DIAGNOSIS — N189 Chronic kidney disease, unspecified: Secondary | ICD-10-CM | POA: Insufficient documentation

## 2018-04-06 DIAGNOSIS — Z8249 Family history of ischemic heart disease and other diseases of the circulatory system: Secondary | ICD-10-CM | POA: Diagnosis not present

## 2018-04-06 DIAGNOSIS — N172 Acute kidney failure with medullary necrosis: Secondary | ICD-10-CM | POA: Insufficient documentation

## 2018-04-06 DIAGNOSIS — G8111 Spastic hemiplegia affecting right dominant side: Secondary | ICD-10-CM | POA: Insufficient documentation

## 2018-04-06 NOTE — Progress Notes (Signed)
Subjective:    Patient ID: [name], male    DOB: 06/01/1951, 67 y.o.   MRN: [id number]  HPI 9/5 FCR, 300 units. FDS 300 units. FDP 300 Pronator teres 200 units. PQ, 200 units FPL 200  Able to use Right hand to hold bag, the patient feels like it is getting a little stronger despite Dysport injections  Wears resting hand splint at night  Pain Inventory Average Pain 0 Pain Right Now 0 My pain is no pain  In the last 24 hours, has pain interfered with the following? General activity 0 Relation with others 0 Enjoyment of life 0 What TIME of day is your pain at its worst? no pain Sleep (in general) Good  Pain is worse with: no pain Pain improves with: no pain Relief from Meds: no meds  Mobility walk with assistance use a cane  Function disabled: date disabled n/a  Neuro/Psych No problems in this area  Prior Studies Any changes since last visit?  no  Physicians involved in your care Any changes since last visit?  no   Family History  Problem Relation Age of Onset  . Diabetes Mother   . Heart attack Mother   . Hypertension Mother   . Diabetes Sister   . Hypertension Sister   . Stroke Sister        2017  . Hypertension Brother   . Hypertension Daughter   . Hypertension Son   . Colon cancer Neg Hx   . Esophageal cancer Neg Hx   . Stomach cancer Neg Hx   . Rectal cancer Neg Hx    Social History   Socioeconomic History  . Marital status: Divorced    Spouse name: Not on file  . Number of children: 3  . Years of education: 64  . Highest education level: Not on file  Occupational History  . Occupation: Disabled-security Veterinary surgeon: [employer]  . Financial resource strain: Not on file  . Food insecurity:    Worry: Not on file    Inability: Not on file  . Transportation needs:    Medical: Not on file    Non-medical: Not on file  Tobacco Use  . Smoking status: Never Smoker  . Smokeless tobacco: Never Used    Substance and Sexual Activity  . Alcohol use: No  . Drug use: No  . Sexual activity: Never    Partners: Female  Lifestyle  . Physical activity:    Days per week: Not on file    Minutes per session: Not on file  . Stress: Not on file  Relationships  . Social connections:    Talks on phone: Not on file    Gets together: Not on file    Attends religious service: Not on file    Active member of club or organization: Not on file    Attends meetings of clubs or organizations: Not on file    Relationship status: Not on file  Other Topics Concern  . Not on file  Social History Narrative   Divorced, lives alone   2 sons, 1 daughter   Retired/disabled   No caffeine   12/24/2015   Past Surgical History:  Procedure Laterality Date  . COLONOSCOPY    . None     Past Medical History:  Diagnosis Date  . 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 CVA (cerebral infarction) 01/03/2008   MRI: Acute 1 x 1.5 cm  infarction affecting the left side of the pons.  . Diabetes mellitus   . DVT (deep venous thrombosis) ([place])   . Hypertension   . PAD (peripheral artery disease) ([place])   . Stroke ([place])    2008   BP (!) 153/66   Pulse 70   Ht 5\' 7"  (1.702 m)   Wt 199 lb 9.6 oz (90.5 kg)   SpO2 96%   BMI 31.26 kg/m   Opioid Risk Score:   Fall Risk Score:  `1  Depression screen PHQ 2/9  Depression screen [facility] 2/9 04/06/2018 01/27/2018 11/03/2017 09/25/2017 08/11/2017 11/30/2015 11/05/2015  Decreased Interest 0 0 0 0 0 0 0  Down, Depressed, Hopeless 0 0 0 0 0 0 0  PHQ - 2 Score 0 0 0 0 0 0 0    Review of Systems  Constitutional: Negative.   HENT: Negative.   Eyes: Negative.   Respiratory: Negative.   Cardiovascular: Negative.   Gastrointestinal: Negative.   Endocrine: Negative.   Genitourinary: Negative.   Musculoskeletal: Negative.   Skin: Negative.   Allergic/Immunologic: Negative.   Neurological: Negative.   Hematological: Negative.   Psychiatric/Behavioral: Negative.   All  other systems reviewed and are negative.      Objective:   Physical Exam  Constitutional: He is oriented to person, place, and time. He appears well-developed and well-nourished. No distress.  HENT:  Head: Normocephalic and atraumatic.  Eyes: Pupils are equal, round, and reactive to light. EOM are normal.  Neurological: He is alert and oriented to person, place, and time.  Dysarthria  3- Delt , bi, triceps, 0/5 finger ext   Skin: Skin is warm and dry. He is not diaphoretic.  Nursing note and vitals reviewed.  MAS 2 at the finger flexors, MAS 0 at the thumb flexor MAS 0 at the wrist flexor MAS 2 at elbow flexor       Assessment & Plan:  #1.  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  We will repeat at current dose and current muscle group selection

## 2018-04-06 NOTE — Patient Instructions (Signed)
May warm up Right hand in warm water prior to stretching

## 2018-04-07 MED ORDER — METFORMIN HCL 1000 MG PO TABS: 1000.0000 mg | ORAL_TABLET | Freq: Two times a day (BID) | ORAL | 3 refills | Status: DC

## 2018-04-21 ENCOUNTER — Other Ambulatory Visit: Payer: Self-pay

## 2018-04-26 ENCOUNTER — Ambulatory Visit (INDEPENDENT_AMBULATORY_CARE_PROVIDER_SITE_OTHER): Payer: Medicare HMO | Admitting: *Deleted

## 2018-04-26 DIAGNOSIS — Z23 Encounter for immunization: Secondary | ICD-10-CM

## 2018-05-20 ENCOUNTER — Ambulatory Visit: Payer: Medicare HMO | Admitting: Physical Medicine & Rehabilitation

## 2018-05-21 ENCOUNTER — Ambulatory Visit: Payer: Medicare HMO

## 2018-05-21 ENCOUNTER — Ambulatory Visit: Payer: Medicare HMO | Admitting: Physical Medicine & Rehabilitation

## 2018-06-01 IMAGING — CT CT HEAD W/O CM
3 of 4 series · 14 of 47 positions shown, 16 images · non-contrast
Comparison: Prior MRI from 01/03/2008.

CLINICAL DATA: Initial evaluation for acute weakness. Multiple
falls.

EXAM:
CT HEAD WITHOUT CONTRAST
TECHNIQUE: Contiguous axial images were obtained from the base of the skull
through the vertex without intravenous contrast.

[Series 2: head w/o · axial · non-contrast · 0.47mm/px · z∈[-205,-70]mm · 8 of 33 slices shown, 10 images]
[im 3/33  brain]
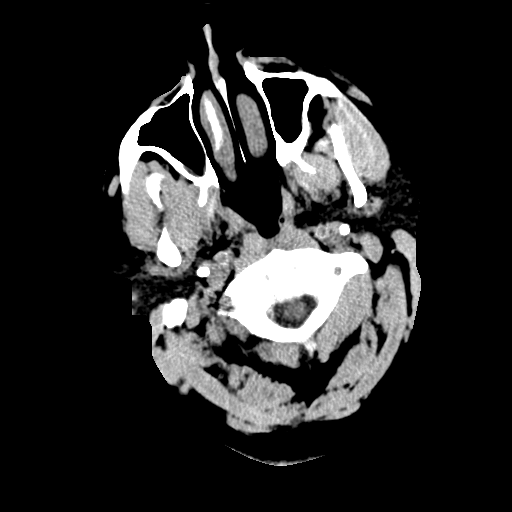
[im 3/33  bone]
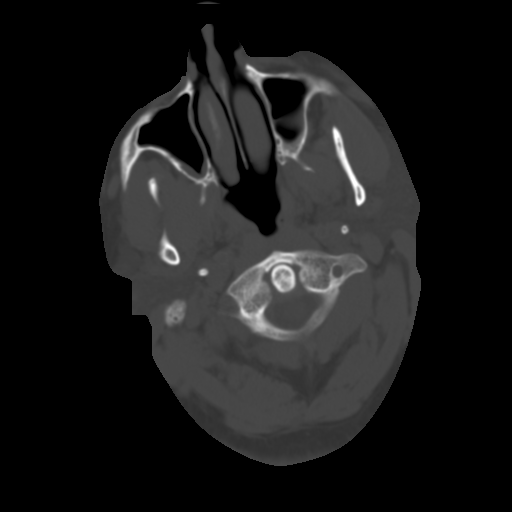
[im 7/33  brain]
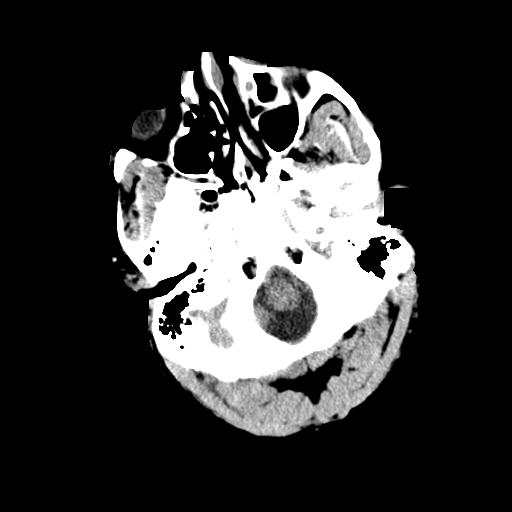
[im 12/33  brain]
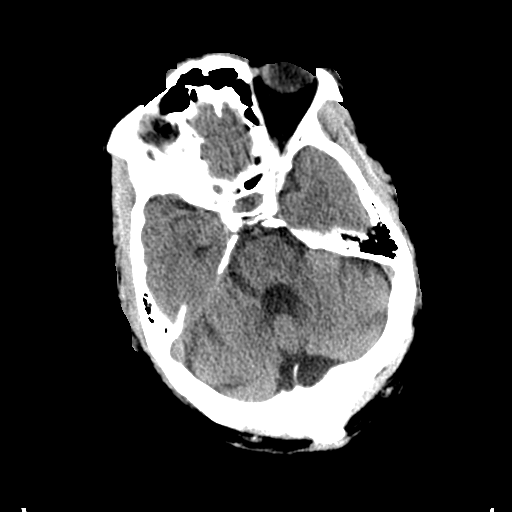
[im 14/33  brain]
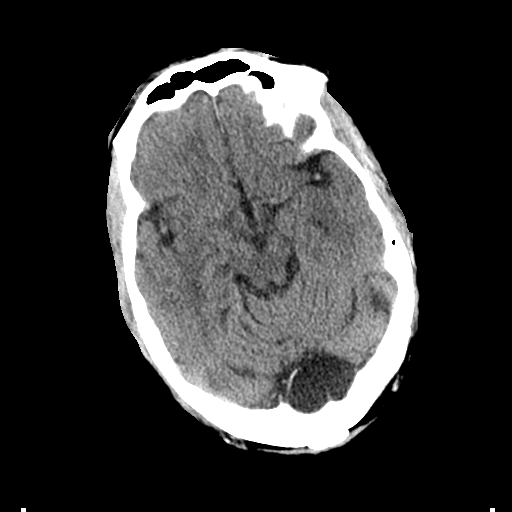
[im 19/33  brain]
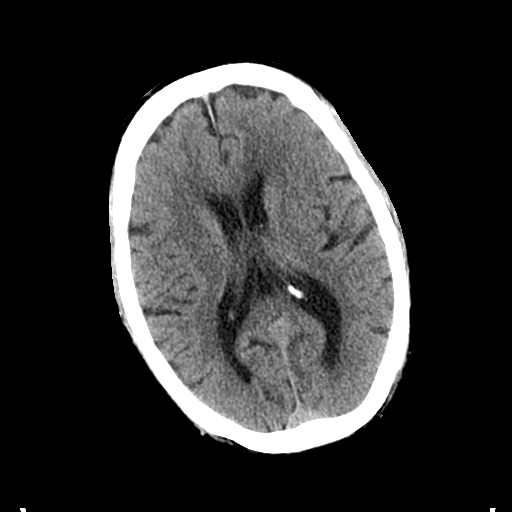
[im 19/33  bone]
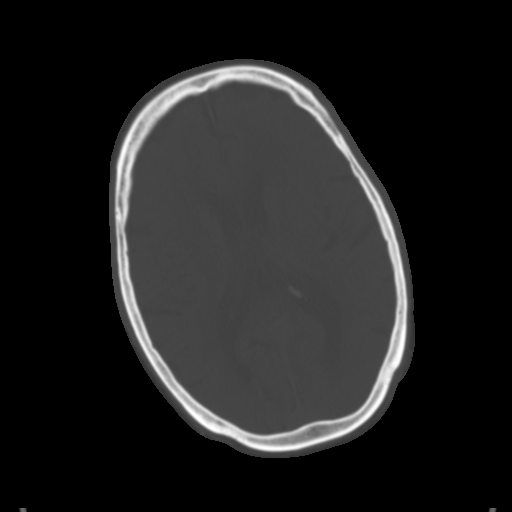
[im 21/33  brain]
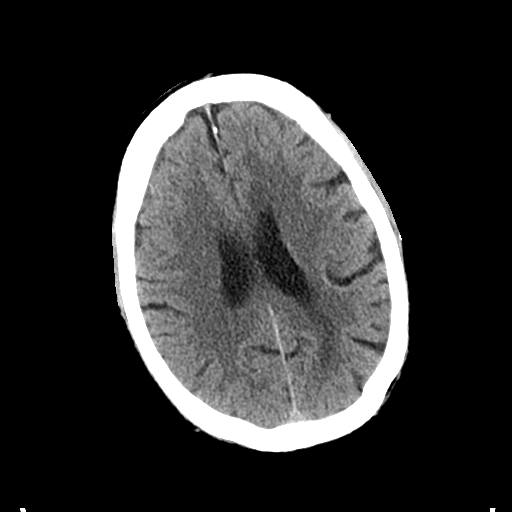
[im 26/33  brain]
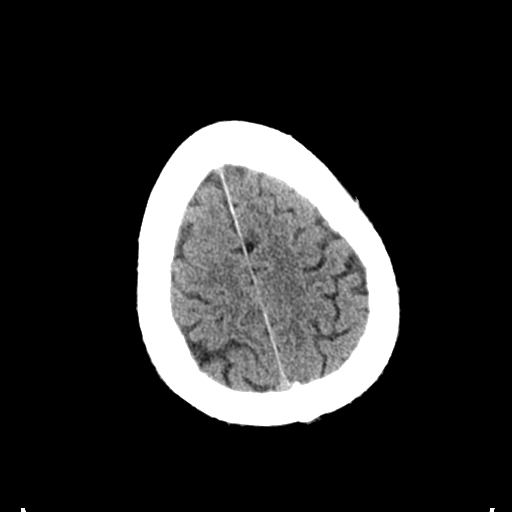
[im 30/33  brain]
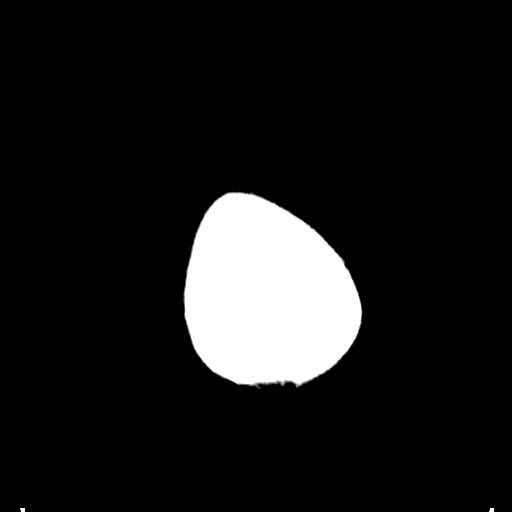

[Series 4: coronal · coronal · 0.32mm/px · 3 of 66 slices shown]
[im 22/66  brain]
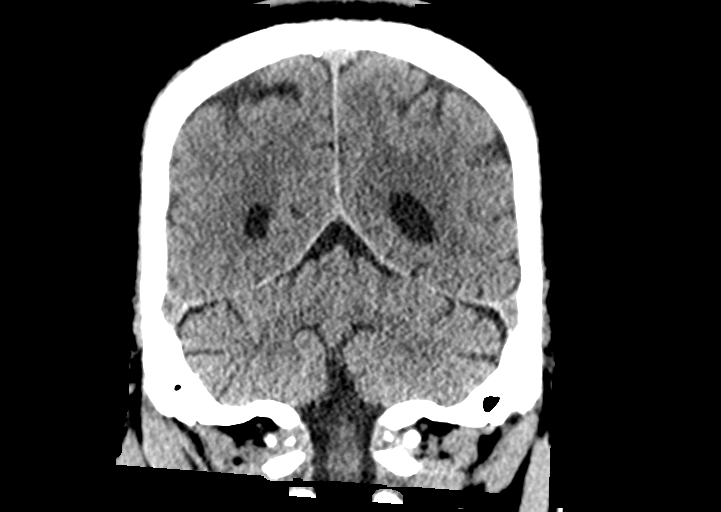
[im 29/66  brain]
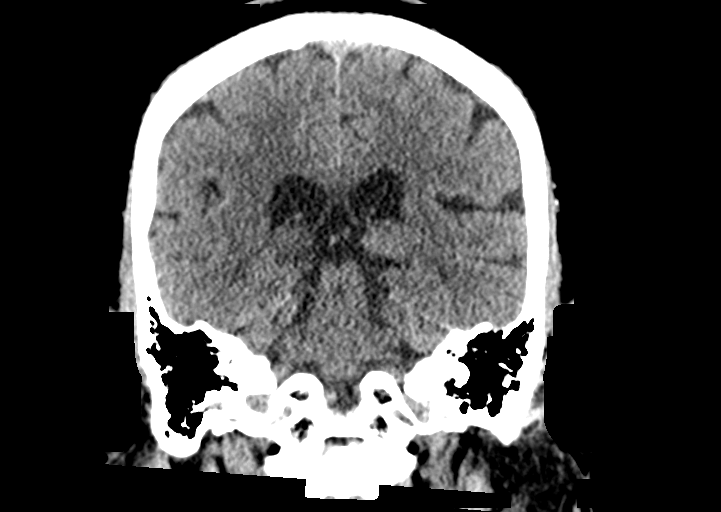
[im 37/66  brain]
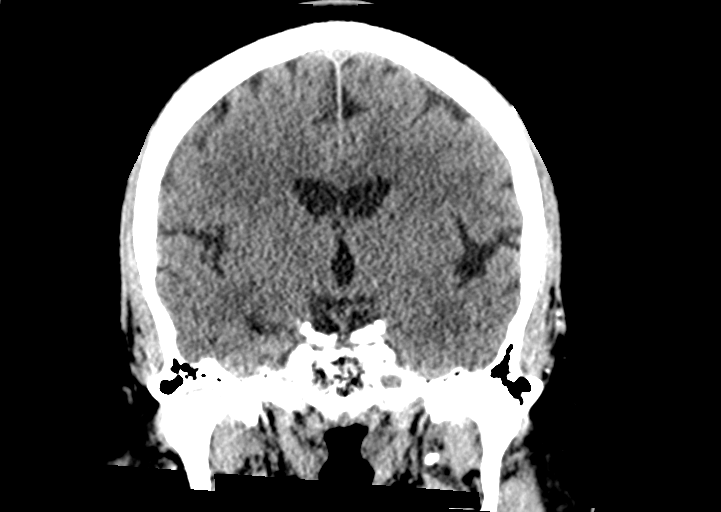

[Series 5: sagittal · sagittal · 0.31mm/px · 3 of 48 slices shown]
[im 17/48  brain]
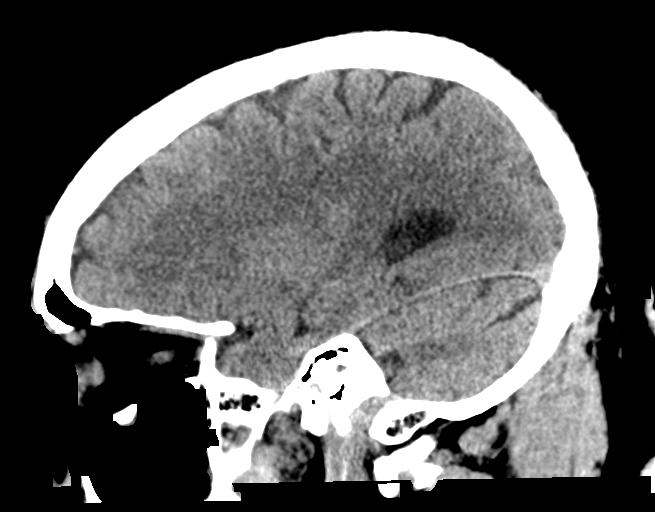
[im 24/48  brain]
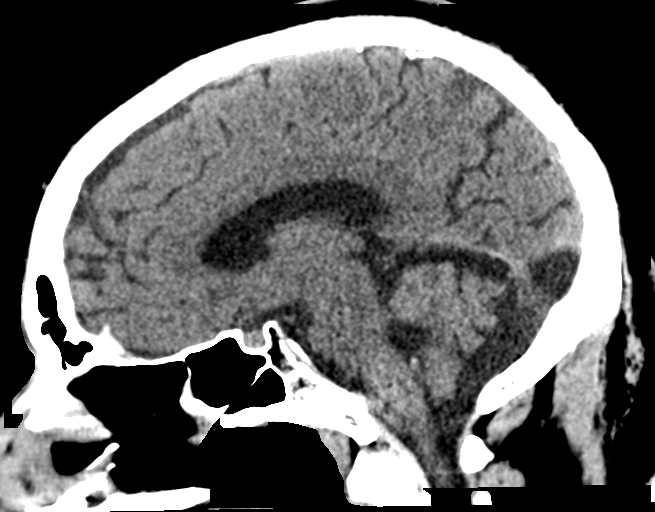
[im 31/48  brain]
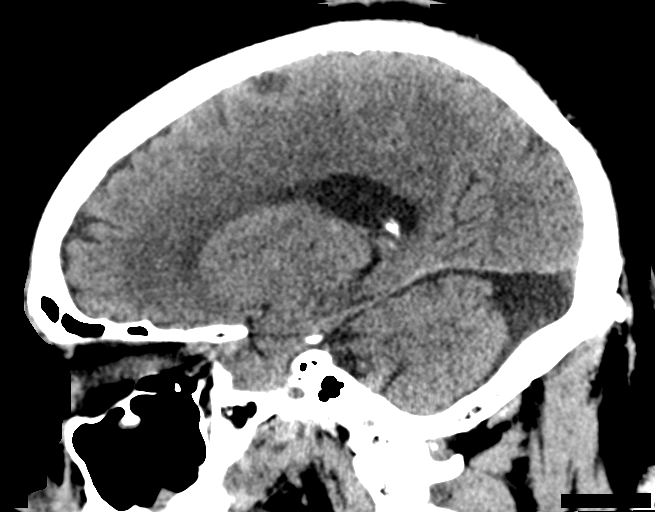

[14 of 47 positions shown; findings below may reference images not displayed]

FINDINGS: Brain: Age appropriate cerebral atrophy with mild chronic small
vessel ischemic disease. No acute intracranial hemorrhage. No
evidence for acute large vessel territory infarct. No mass lesion,
midline shift or mass effect. No hydrocephalus. Posterior fossa cyst
again noted. No other extra-axial fluid collection.

Vascular: No hyperdense vessel. Scattered vascular calcifications
noted within the carotid siphons.

Skull: Scalp soft tissues demonstrate no acute abnormality.
Calvarium intact.

Sinuses/Orbits: Globes and orbital soft tissues within normal
limits. Mild mucosal thickening within the ethmoidal air cells and
maxillary sinuses. Paranasal sinuses are otherwise clear. No mastoid
effusion.
IMPRESSION: 1. No acute intracranial process identified.
2. Mild chronic microvascular ischemic disease.

## 2018-06-04 ENCOUNTER — Encounter: Payer: Self-pay | Admitting: Physical Medicine & Rehabilitation

## 2018-06-04 ENCOUNTER — Encounter: Payer: Medicare HMO | Attending: Physical Medicine & Rehabilitation

## 2018-06-04 ENCOUNTER — Ambulatory Visit: Payer: Medicare HMO | Admitting: Physical Medicine & Rehabilitation

## 2018-06-04 VITALS — BP 153/73 | HR 68 | Ht 67.0 in | Wt 206.0 lb

## 2018-06-04 DIAGNOSIS — N172 Acute kidney failure with medullary necrosis: Secondary | ICD-10-CM | POA: Insufficient documentation

## 2018-06-04 DIAGNOSIS — N189 Chronic kidney disease, unspecified: Secondary | ICD-10-CM | POA: Diagnosis not present

## 2018-06-04 DIAGNOSIS — E1122 Type 2 diabetes mellitus with diabetic chronic kidney disease: Secondary | ICD-10-CM | POA: Insufficient documentation

## 2018-06-04 DIAGNOSIS — G8111 Spastic hemiplegia affecting right dominant side: Secondary | ICD-10-CM | POA: Insufficient documentation

## 2018-06-04 DIAGNOSIS — Z8249 Family history of ischemic heart disease and other diseases of the circulatory system: Secondary | ICD-10-CM | POA: Diagnosis not present

## 2018-06-04 DIAGNOSIS — Z86718 Personal history of other venous thrombosis and embolism: Secondary | ICD-10-CM | POA: Diagnosis not present

## 2018-06-04 DIAGNOSIS — I129 Hypertensive chronic kidney disease with stage 1 through stage 4 chronic kidney disease, or unspecified chronic kidney disease: Secondary | ICD-10-CM | POA: Diagnosis not present

## 2018-06-04 DIAGNOSIS — I679 Cerebrovascular disease, unspecified: Secondary | ICD-10-CM

## 2018-06-04 DIAGNOSIS — T8611 Kidney transplant rejection: Secondary | ICD-10-CM | POA: Insufficient documentation

## 2018-06-04 DIAGNOSIS — I739 Peripheral vascular disease, unspecified: Secondary | ICD-10-CM | POA: Diagnosis not present

## 2018-06-04 DIAGNOSIS — Z794 Long term (current) use of insulin: Secondary | ICD-10-CM | POA: Diagnosis not present

## 2018-06-04 DIAGNOSIS — Z833 Family history of diabetes mellitus: Secondary | ICD-10-CM | POA: Insufficient documentation

## 2018-06-04 DIAGNOSIS — I69322 Dysarthria following cerebral infarction: Secondary | ICD-10-CM | POA: Diagnosis not present

## 2018-06-04 NOTE — Patient Instructions (Signed)

## 2018-06-04 NOTE — Progress Notes (Signed)
Dysport Injection for spasticity using needle EMG guidance  Dilution: 200 Units/ml Indication: Severe spasticity which interferes with ADL,mobility and/or  hygiene and is unresponsive to medication management and other conservative care Informed consent was obtained after describing risks and benefits of the procedure with the patient. This includes bleeding, bruising, infection, excessive weakness, or medication side effects. A REMS form is on file and signed. Needle:  needle electrode Number of units per muscle FCR, 300 units. FDS 300 units. FDP 300 Pronator teres 200 units. PQ, 200 units FPL 200 All injections were done after obtaining appropriate EMG activity and after negative drawback for blood. The patient tolerated the procedure well. Post procedure instructions were given. A followup appointment was made.

## 2018-06-08 ENCOUNTER — Ambulatory Visit: Payer: Medicare HMO | Admitting: Family Medicine

## 2018-06-18 ENCOUNTER — Ambulatory Visit (INDEPENDENT_AMBULATORY_CARE_PROVIDER_SITE_OTHER): Payer: Medicare HMO | Admitting: Family Medicine

## 2018-06-18 VITALS — BP 122/70 | HR 75 | Temp 98.2°F | Wt 202.0 lb

## 2018-06-18 DIAGNOSIS — F329 Major depressive disorder, single episode, unspecified: Secondary | ICD-10-CM | POA: Diagnosis not present

## 2018-06-18 DIAGNOSIS — R69 Illness, unspecified: Secondary | ICD-10-CM | POA: Diagnosis not present

## 2018-06-18 DIAGNOSIS — E1159 Type 2 diabetes mellitus with other circulatory complications: Secondary | ICD-10-CM | POA: Diagnosis not present

## 2018-06-18 DIAGNOSIS — Z794 Long term (current) use of insulin: Secondary | ICD-10-CM | POA: Diagnosis not present

## 2018-06-18 DIAGNOSIS — I10 Essential (primary) hypertension: Secondary | ICD-10-CM | POA: Diagnosis not present

## 2018-06-18 DIAGNOSIS — F32A Depression, unspecified: Secondary | ICD-10-CM

## 2018-06-18 LAB — POCT GLYCOSYLATED HEMOGLOBIN (HGB A1C): HbA1c, POC (controlled diabetic range): 6.5 % (ref 0.0–7.0)

## 2018-06-18 MED ORDER — ATORVASTATIN CALCIUM 40 MG PO TABS: 40.0000 mg | ORAL_TABLET | Freq: Every day | ORAL | 3 refills | Status: DC

## 2018-06-18 MED ORDER — CHLORTHALIDONE 25 MG PO TABS: 25.0000 mg | ORAL_TABLET | Freq: Every day | ORAL | 3 refills | Status: DC

## 2018-06-18 MED ORDER — LISINOPRIL 40 MG PO TABS: 40.0000 mg | ORAL_TABLET | Freq: Every day | ORAL | 3 refills | Status: DC

## 2018-06-18 MED ORDER — CLOPIDOGREL BISULFATE 75 MG PO TABS: ORAL_TABLET | ORAL | 6 refills | Status: DC

## 2018-06-18 MED ORDER — METFORMIN HCL 1000 MG PO TABS: 1000.0000 mg | ORAL_TABLET | Freq: Two times a day (BID) | ORAL | 3 refills | Status: DC

## 2018-06-18 MED ORDER — SERTRALINE HCL 100 MG PO TABS: 100.0000 mg | ORAL_TABLET | Freq: Every day | ORAL | 2 refills | Status: DC

## 2018-06-18 MED ORDER — CARVEDILOL 25 MG PO TABS: ORAL_TABLET | ORAL | 3 refills | Status: DC

## 2018-06-18 NOTE — Patient Instructions (Signed)
It was great to see you today! Thank you for letting me participate in your care!  Today, we discussed your diabetes and you are doing well!  Keep up the good work. You are now only on Metformin twice a day to control your diabetes. Please continue taking it as prescribed. I will see you back in 6 months.  Be well, Harolyn Rutherford, DO PGY-2, [facility]

## 2018-06-18 NOTE — Progress Notes (Signed)
Subjective: Chief Complaint  Patient presents with  . Diabetes    HPI: [name] is a 68 y.o. presenting to clinic today to discuss the following:  T2DM  Patient presents today for a 3 month follow up for management of his type 2 diabetes. Overall, he is very compliant with medications and checking his blood sugar. He brought in his glucose monitor today and his 3 month average was at 143. His A1c was at 6.5 which is at goal. He is only on Metformin now and is taking it as prescribed with no reported side effects. No episodes of hypoglycemia.  HTN Well controlled at today's visit. No episodes of blurry vision, headaches, dizziness, syncope or near syncope. He is tolerating his BP meds well and is taking them as prescribed. He is currently on coreg and lisinopril. No cough.  Health Maintenance: none due     ROS noted in HPI.   Past Medical, Surgical, Social, and Family History Reviewed & Updated per EMR.   Pertinent Historical Findings include:   Social History   Tobacco Use  Smoking Status Never Smoker  Smokeless Tobacco Never Used    Objective: BP 122/70   Pulse 75   Temp 98.2 F (36.8 C)   Wt 202 lb (91.6 kg)   SpO2 98%   BMI 31.64 kg/m  Vitals and nursing notes reviewed  Physical Exam Gen: Alert and Oriented x 3, NAD HEENT: Normocephalic, atraumatic CV: RRR, no murmurs, normal S1, S2 split Resp: CTAB, no wheezing, rales, or rhonchi, comfortable work of breathing Abd: non-distended, non-tender, soft, +bs in all four quadrants Ext: no clubbing, cyanosis, or edema Skin: warm, dry, intact, no rashes  LABS HgbA1c: 6.5 06/18/2018  Assessment/Plan:  Benign essential HTN Well controlled on current regimen. No changes. - Cont Coreg 25mg  BID daily - Cont Lisinopril 10mg  once daily  Diabetes mellitus, type II ([place]) Continues to do well at goal with A1c of 6.5 after stopping Victoza. Will continue to monitor every 3 months - Cont Metformin 1000mg   BID daily - No coverage for Glucerna but his daughter does help cover cost so he continues to drink them.    PATIENT EDUCATION PROVIDED: See AVS    Diagnosis and plan along with any newly prescribed medication(s) were discussed in detail with this patient today. The patient verbalized understanding and agreed with the plan. Patient advised if symptoms worsen return to clinic or ER.   Health Maintainance:   Orders Placed This Encounter  Procedures  . HgB A1c    Meds ordered this encounter  Medications  . atorvastatin (LIPITOR) 40 MG tablet    Sig: Take 1 tablet (40 mg total) by mouth daily.    Dispense:  90 tablet    Refill:  3  . carvedilol (COREG) 25 MG tablet    Sig: TAKE 1 TABLET BY MOUTH TWICE A DAY WITH A MEAL    Dispense:  120 tablet    Refill:  3  . clopidogrel (PLAVIX) 75 MG tablet    Sig: TAKE 1 TABLET (75 MG TOTAL) BY MOUTH DAILY.    Dispense:  30 tablet    Refill:  6  . chlorthalidone (HYGROTON) 25 MG tablet    Sig: Take 1 tablet (25 mg total) by mouth daily with lunch.    Dispense:  90 tablet    Refill:  3  . lisinopril (PRINIVIL,ZESTRIL) 40 MG tablet    Sig: Take 1 tablet (40 mg total) by mouth daily.  Dispense:  90 tablet    Refill:  3  . metFORMIN (GLUCOPHAGE) 1000 MG tablet    Sig: Take 1 tablet (1,000 mg total) by mouth 2 (two) times daily with a meal.    Dispense:  180 tablet    Refill:  3  . sertraline (ZOLOFT) 100 MG tablet    Sig: Take 1 tablet (100 mg total) by mouth daily.    Dispense:  90 tablet    Refill:  2     Harolyn Rutherford, DO 06/18/2018, 1:45 PM PGY-2 [place]

## 2018-06-19 LAB — BASIC METABOLIC PANEL
BUN / CREAT RATIO: 27 — AB (ref 10–24)
BUN: 29 mg/dL — AB (ref 8–27)
CHLORIDE: 103 mmol/L (ref 96–106)
CO2: 22 mmol/L (ref 20–29)
Calcium: 9.3 mg/dL (ref 8.6–10.2)
Creatinine, Ser: 1.09 mg/dL (ref 0.76–1.27)
GFR calc non Af Amer: 70 mL/min/{1.73_m2} (ref >59)
GFR, EST AFRICAN AMERICAN: 81 mL/min/{1.73_m2} (ref >59)
GLUCOSE: 147 mg/dL — AB (ref 65–99)
Potassium: 3.9 mmol/L (ref 3.5–5.2)
SODIUM: 139 mmol/L (ref 134–144)

## 2018-06-23 NOTE — Assessment & Plan Note (Signed)
Well controlled on current regimen. No changes. - Cont Coreg 25mg  BID daily - Cont Lisinopril 10mg  once daily

## 2018-06-23 NOTE — Assessment & Plan Note (Signed)
Continues to do well at goal with A1c of 6.5 after stopping Victoza. Will continue to monitor every 3 months - Cont Metformin 1000mg  BID daily - No coverage for Glucerna but his daughter does help cover cost so he continues to drink them.

## 2018-07-16 ENCOUNTER — Ambulatory Visit: Payer: Medicare HMO | Admitting: Physical Medicine & Rehabilitation

## 2019-01-18 ENCOUNTER — Other Ambulatory Visit: Payer: Self-pay

## 2019-01-18 MED ORDER — CLOPIDOGREL BISULFATE 75 MG PO TABS: ORAL_TABLET | ORAL | 6 refills | Status: DC

## 2019-01-31 ENCOUNTER — Telehealth: Payer: Self-pay | Admitting: Family Medicine

## 2019-01-31 NOTE — Telephone Encounter (Signed)
SCAT and DMV form dropped off for transportation at front desk for completion.  Verified that patient section of form has been completed.  Last DOS/WCC with PCP was 06/18/2018.  Placed form in team folder to be completed by clinical staff.  [name]

## 2019-01-31 NOTE — Telephone Encounter (Signed)
Reviewed form and placed in PCP's box for completion.  .Jayquan Bradsher R Ivionna Verley, CMA  

## 2019-02-08 NOTE — Telephone Encounter (Signed)
LMOV[facility] letting pt know forms are ready to be picked up. Deseree Kennon Holter, CMA

## 2019-03-24 ENCOUNTER — Other Ambulatory Visit: Payer: Self-pay | Admitting: Family Medicine

## 2019-04-01 DIAGNOSIS — R69 Illness, unspecified: Secondary | ICD-10-CM | POA: Diagnosis not present

## 2019-06-27 ENCOUNTER — Other Ambulatory Visit: Payer: Self-pay | Admitting: *Deleted

## 2019-06-27 MED ORDER — CHLORTHALIDONE 25 MG PO TABS: 25.0000 mg | ORAL_TABLET | Freq: Every day | ORAL | 3 refills | Status: DC

## 2019-06-27 MED ORDER — ATORVASTATIN CALCIUM 40 MG PO TABS: 40.0000 mg | ORAL_TABLET | Freq: Every day | ORAL | 3 refills | Status: DC

## 2019-06-27 MED ORDER — METFORMIN HCL 1000 MG PO TABS: 1000.0000 mg | ORAL_TABLET | Freq: Two times a day (BID) | ORAL | 3 refills | Status: DC

## 2019-07-14 ENCOUNTER — Other Ambulatory Visit: Payer: Self-pay

## 2019-07-14 MED ORDER — CLOPIDOGREL BISULFATE 75 MG PO TABS: ORAL_TABLET | ORAL | 6 refills | Status: DC

## 2019-07-18 ENCOUNTER — Other Ambulatory Visit: Payer: Self-pay | Admitting: *Deleted

## 2019-07-18 MED ORDER — LISINOPRIL 40 MG PO TABS: 40.0000 mg | ORAL_TABLET | Freq: Every day | ORAL | 3 refills | Status: DC

## 2019-10-22 ENCOUNTER — Other Ambulatory Visit: Payer: Self-pay | Admitting: Family Medicine

## 2019-10-22 DIAGNOSIS — F32A Depression, unspecified: Secondary | ICD-10-CM

## 2019-12-27 ENCOUNTER — Other Ambulatory Visit: Payer: Self-pay | Admitting: Family Medicine

## 2020-03-12 DIAGNOSIS — R69 Illness, unspecified: Secondary | ICD-10-CM | POA: Diagnosis not present

## 2020-06-13 ENCOUNTER — Other Ambulatory Visit: Payer: Self-pay | Admitting: Family Medicine

## 2020-06-14 ENCOUNTER — Other Ambulatory Visit: Payer: Self-pay | Admitting: Family Medicine

## 2020-07-08 ENCOUNTER — Other Ambulatory Visit: Payer: Self-pay | Admitting: Family Medicine

## 2020-07-08 DIAGNOSIS — F32A Depression, unspecified: Secondary | ICD-10-CM

## 2020-10-13 ENCOUNTER — Other Ambulatory Visit: Payer: Self-pay | Admitting: Family Medicine

## 2020-11-26 ENCOUNTER — Ambulatory Visit: Payer: Medicare HMO | Admitting: Family Medicine

## 2020-11-27 ENCOUNTER — Other Ambulatory Visit: Payer: Self-pay

## 2020-11-27 ENCOUNTER — Encounter: Payer: Self-pay | Admitting: Family Medicine

## 2020-11-27 ENCOUNTER — Ambulatory Visit (INDEPENDENT_AMBULATORY_CARE_PROVIDER_SITE_OTHER): Payer: Medicare HMO

## 2020-11-27 ENCOUNTER — Ambulatory Visit (INDEPENDENT_AMBULATORY_CARE_PROVIDER_SITE_OTHER): Payer: Medicare HMO | Admitting: Family Medicine

## 2020-11-27 VITALS — BP 158/64 | HR 63 | Ht 67.0 in | Wt 199.4 lb

## 2020-11-27 DIAGNOSIS — R42 Dizziness and giddiness: Secondary | ICD-10-CM | POA: Diagnosis not present

## 2020-11-27 DIAGNOSIS — E1159 Type 2 diabetes mellitus with other circulatory complications: Secondary | ICD-10-CM | POA: Diagnosis not present

## 2020-11-27 DIAGNOSIS — Z794 Long term (current) use of insulin: Secondary | ICD-10-CM

## 2020-11-27 DIAGNOSIS — E782 Mixed hyperlipidemia: Secondary | ICD-10-CM

## 2020-11-27 DIAGNOSIS — I10 Essential (primary) hypertension: Secondary | ICD-10-CM | POA: Diagnosis not present

## 2020-11-27 DIAGNOSIS — B351 Tinea unguium: Secondary | ICD-10-CM

## 2020-11-27 DIAGNOSIS — R5381 Other malaise: Secondary | ICD-10-CM | POA: Diagnosis not present

## 2020-11-27 DIAGNOSIS — Z23 Encounter for immunization: Secondary | ICD-10-CM

## 2020-11-27 DIAGNOSIS — N182 Chronic kidney disease, stage 2 (mild): Secondary | ICD-10-CM | POA: Diagnosis not present

## 2020-11-27 LAB — POCT GLYCOSYLATED HEMOGLOBIN (HGB A1C): HbA1c, POC (controlled diabetic range): 7.3 % — AB (ref 0.0–7.0)

## 2020-11-27 MED ORDER — BLOOD PRESSURE MONITOR DEVI: 1.0000 [IU] | Freq: Every day | 0 refills | Status: DC

## 2020-11-27 NOTE — Patient Instructions (Signed)
Thank you for coming in to see [place] today! Please see below to review our plan for today's visit:  1. Please come back to see [place] in 1-2 weeks. Please schedule this appointment on your way out of the clinic. We need to follow up for your blood pressure and your toe.  2. Continue your Metformin as prescribed. Plan to recheck your A1c in 3 months (September 2022).  3. Take your Coreg/Carvedilol 25mg  twice daily, Chlorthalidone 25mg  daily and Lisinopril 40mg  daily. Please come back in 1-2 weeks to check your blood pressure again because we might need to add on a new medication if it is still high.   Please call the clinic at [phone] if your symptoms worsen or you have any concerns. It was our pleasure to serve you!   Dr. Milus Banister [facility]

## 2020-11-27 NOTE — Progress Notes (Signed)
SUBJECTIVE:   CHIEF COMPLAINT / HPI:   HPI: [name] is a 70 y.o. presenting to clinic today for check up and COVID booster. He has not been seen in our clinic since December 2020.    T2DM Patient presents today for a follow up for management of his type 2 diabetes.  Patient has not been seen in our clinic since 2020 due to the pandemic.  His A1c is right at goal of 7.3%.  He is currently only taking metformin.    HTN BP today is slightly elevated at 158/64. No episodes of blurry vision, headaches, dizziness, syncope or near syncope.  Patient reports he is taken all of his medications for his blood pressure this morning; however, cannot recall what his medications are.  Patient is prescribed Coreg 25 mg twice daily, lisinopril 40 mg twice daily, and chlorthalidone 25 milligrams once daily.  He has not been checking his blood pressures at home.    HLD: patient with history of CVA or MI. Is on Lipitor 40mg , would benefit form repeat lipid panel and LDL <70.   Pain with toenails on right foot: Patient reports having enlarged toenail of right foot.  He has not followed by podiatrist or having regular manicures.  He is unable to check the bottoms of his feet.  Patient reports that toenail of great toe on right foot has been thick and enlarging over the past 6 months.  He occasionally trims his own toenails.  Health Maintenance: COVID vaccine, shingles vaccine, eye exam   PERTINENT  PMH / PSH:  HTN, HLD, PAD, CKD2, CVA with Aphasia (2018)   OBJECTIVE:   BP (!) 158/64   Pulse 63   Ht 5\' 7"  (1.702 m)   Wt 199 lb 6.4 oz (90.4 kg)   SpO2 92%   BMI 31.23 kg/m   Physical exam: General: Patient appears elderly, very slow ambulation; otherwise nontoxic-appearing and very pleasant Respiratory: Comfortable work of breathing on room air Right foot: Patient has significantly thickened toenail on great toe of right foot, nails from other toes are significantly elongated, some to the  point where they are curling around and trying to digging to his skin.  Foul smell is appreciated.  See photo below.  Otherwise no ulcerations or lesions appreciated to skin.      ASSESSMENT/PLAN:   Benign essential HTN Patient's blood pressure today elevated at 158/64.  Patient has been medications for the last several days. -Asked patient to continue Coreg 25 mg twice daily, lisinopril 40 mg once daily, and chlorthalidone 25 mg once daily. -Patient given refill of his medications -Patient given new prescription for blood pressure cuff at home, asked to monitor blood pressures once daily at home and record them, asked him to bring recordings to doctor's visit -Asked patient to follow-up in 1-2 weeks to repeat blood pressure and make adjustments as needed  Diabetes mellitus, type II ([place]) Diabetes remains relatively well controlled with A1c of 7.3% -Patient to continue metformin 1000 mg twice daily with meals  Onychomycosis of great toe Patient with history of onychomycosis of his toes.  He was previously treated with terbinafine for 6 weeks and was asked to follow-up with podiatry; however, patient reports he does not have a podiatrist. -Nails were trimmed down today -Shavings were collected for fungal culture, could help direct treatment -Referral placed for podiatrist   Hyperlipidemia -Lipid panel checked at today's visit -Patient to continue Lipitor 40 mg  COVID booster -Patient received COVID  booster vaccine at today's visit  Physical deconditioning Due to patient's slow gait, comorbid conditions and increased fall rate, referral placed for physical therapy for the patient to help regain strength and prevent falls.  Patient lives at home alone and wears a life alert notification around neck.  [place]

## 2020-11-28 LAB — BASIC METABOLIC PANEL
BUN/Creatinine Ratio: 25 — ABNORMAL HIGH (ref 10–24)
BUN: 29 mg/dL — ABNORMAL HIGH (ref 8–27)
CO2: 24 mmol/L (ref 20–29)
Calcium: 9.4 mg/dL (ref 8.6–10.2)
Chloride: 100 mmol/L (ref 96–106)
Creatinine, Ser: 1.14 mg/dL (ref 0.76–1.27)
Glucose: 184 mg/dL — ABNORMAL HIGH (ref 65–99)
Potassium: 4.4 mmol/L (ref 3.5–5.2)
Sodium: 139 mmol/L (ref 134–144)
eGFR: 70 mL/min/{1.73_m2} (ref >59)

## 2020-11-28 LAB — LIPID PANEL
Chol/HDL Ratio: 2.4 ratio (ref 0.0–5.0)
Cholesterol, Total: 111 mg/dL (ref 100–199)
HDL: 47 mg/dL (ref >39)
LDL Chol Calc (NIH): 52 mg/dL (ref 0–99)
Triglycerides: 47 mg/dL (ref 0–149)
VLDL Cholesterol Cal: 12 mg/dL (ref 5–40)

## 2020-11-28 NOTE — Assessment & Plan Note (Signed)
Patient's blood pressure today elevated at 158/64.  Patient has been medications for the last several days. -Asked patient to continue Coreg 25 mg twice daily, lisinopril 40 mg once daily, and chlorthalidone 25 mg once daily. -Patient given refill of his medications -Patient given new prescription for blood pressure cuff at home, asked to monitor blood pressures once daily at home and record them, asked him to bring recordings to doctor's visit -Asked patient to follow-up in 1-2 weeks to repeat blood pressure and make adjustments as needed

## 2020-11-28 NOTE — Assessment & Plan Note (Signed)
Diabetes remains relatively well controlled with A1c of 7.3% -Patient to continue metformin 1000 mg twice daily with meals

## 2020-11-28 NOTE — Assessment & Plan Note (Signed)
Patient with history of onychomycosis of his toes.  He was previously treated with terbinafine for 6 weeks and was asked to follow-up with podiatry; however, patient reports he does not have a podiatrist. -Nails were trimmed down today -Shavings were collected for fungal culture, could help direct treatment -Referral placed for podiatrist

## 2020-12-12 ENCOUNTER — Ambulatory Visit (INDEPENDENT_AMBULATORY_CARE_PROVIDER_SITE_OTHER): Payer: Medicare HMO | Admitting: Family Medicine

## 2020-12-12 ENCOUNTER — Encounter: Payer: Self-pay | Admitting: Family Medicine

## 2020-12-12 ENCOUNTER — Other Ambulatory Visit: Payer: Self-pay

## 2020-12-12 VITALS — BP 138/76 | HR 73 | Ht 67.0 in | Wt 201.2 lb

## 2020-12-12 DIAGNOSIS — I10 Essential (primary) hypertension: Secondary | ICD-10-CM | POA: Diagnosis not present

## 2020-12-12 NOTE — Patient Instructions (Addendum)
It was great seeing you today!   I'd like to see you back 1 month for blood pressure check but if you need to be seen earlier than that for any new issues we're happy to fit you in, just give [place] a call!  Goal BP <135/85 No changes to your medications made today. Follow up in 1 month.    If you have questions or concerns please do not hesitate to call at [phone].  Dr. Rushie [facility]

## 2020-12-12 NOTE — Progress Notes (Signed)
   SUBJECTIVE:   CHIEF COMPLAINT / HPI:   Chief Complaint  Patient presents with   Follow-up    BP     [name] is a 70 y.o. male here for HTN follow up.   HTN Takes his medications "when he thinks about it" but usually takes them every day. Denies missing doses of antihypertensive medications. Denies chest pain, palpitations, lower extremity edema, exertional dyspnea, lightheadedness, headaches and vision changes.  Home BP Monitoring: No  Exercises:  not active Low salt diet: no    PERTINENT  PMH / PSH: reviewed and updated as appropriate   OBJECTIVE:   BP (!) 179/68   Pulse 73   Ht 5\' 7"  (1.702 m)   Wt 201 lb 4 oz (91.3 kg)   BMI 31.52 kg/m   Left arm: 138/76  GEN: pleasant male, in no acute distress  CV: regular rate and rhythm, no murmurs appreciated  RESP: no increased work of breathing, clear to ascultation bilaterally  MSK: no LE edema, cane present in room, brace on RLE  SKIN: warm, dry   ASSESSMENT/PLAN:   Benign essential HTN Stable. BP repeated after 15-20 minutes on left arm: 138/76. Continue Coreg 25 mg twice daily, lisinopril 40 mg once daily, and chlorthalidone 25 mg once daily. Follow up in 3 months.    Physical therapy set up and will be going in 2 weeks. Will see the podiatrist next week for onychomycosis.    Lyndee Hensen, DO PGY-3, [facility] 12/12/2020

## 2020-12-15 NOTE — Assessment & Plan Note (Signed)
Stable. BP repeated after 15-20 minutes on left arm: 138/76. Continue Coreg 25 mg twice daily, lisinopril 40 mg once daily, and chlorthalidone 25 mg once daily. Follow up in 3 months.

## 2020-12-18 ENCOUNTER — Other Ambulatory Visit: Payer: Self-pay

## 2020-12-18 MED ORDER — ATORVASTATIN CALCIUM 40 MG PO TABS: 40.0000 mg | ORAL_TABLET | Freq: Every day | ORAL | 3 refills | Status: DC

## 2020-12-19 ENCOUNTER — Ambulatory Visit: Payer: Medicare HMO | Admitting: Podiatry

## 2020-12-19 ENCOUNTER — Encounter: Payer: Self-pay | Admitting: Podiatry

## 2020-12-19 ENCOUNTER — Other Ambulatory Visit: Payer: Self-pay

## 2020-12-19 DIAGNOSIS — E1159 Type 2 diabetes mellitus with other circulatory complications: Secondary | ICD-10-CM | POA: Diagnosis not present

## 2020-12-19 DIAGNOSIS — M79675 Pain in left toe(s): Secondary | ICD-10-CM | POA: Diagnosis not present

## 2020-12-19 DIAGNOSIS — Z794 Long term (current) use of insulin: Secondary | ICD-10-CM | POA: Diagnosis not present

## 2020-12-19 DIAGNOSIS — M79674 Pain in right toe(s): Secondary | ICD-10-CM | POA: Diagnosis not present

## 2020-12-19 DIAGNOSIS — I739 Peripheral vascular disease, unspecified: Secondary | ICD-10-CM

## 2020-12-19 DIAGNOSIS — N182 Chronic kidney disease, stage 2 (mild): Secondary | ICD-10-CM | POA: Diagnosis not present

## 2020-12-19 DIAGNOSIS — B351 Tinea unguium: Secondary | ICD-10-CM

## 2020-12-19 NOTE — Progress Notes (Signed)
This patient returns to my office for at risk foot care.  This patient requires this care by a professional since this patient will be at risk due to having CKD, PAD, CVA and diabetes.    This patient is unable to cut nails himself since the patient cannot reach his nails.These nails are painful walking and wearing shoes.  This patient presents for at risk foot care today.  General Appearance  Alert, conversant and in no acute stress.  Vascular  Dorsalis pedis and posterior tibial  pulses are  weakly palpable  bilaterally.  Capillary return is within normal limits  bilaterally. Temperature is within normal limits  bilaterally.  Neurologic  Senn-Weinstein monofilament wire test within normal limits  bilaterally. Muscle power within normal limits bilaterally.  Nails Thick disfigured discolored nails with subungual debris  from hallux to fifth toes bilaterally. No evidence of bacterial infection or drainage bilaterally.  Orthopedic  No limitations of motion  feet .  No crepitus or effusions noted.  No bony pathology or digital deformities noted. Hallux limitus 1st MPJ  B/L.  Skin  normotropic skin with no porokeratosis noted bilaterally.  No signs of infections or ulcers noted.     Onychomycosis  Pain in right toes  Pain in left toes  Consent was obtained for treatment procedures.   Mechanical debridement of nails 1-5  bilaterally performed with a nail nipper.  Filed with dremel without incident.    Return office visit   3 months                  Told patient to return for periodic foot care and evaluation due to potential at risk complications.   Gardiner Barefoot DPM

## 2020-12-26 ENCOUNTER — Ambulatory Visit: Payer: Medicare HMO | Admitting: Physical Therapy

## 2020-12-30 LAB — FUNGUS CULTURE, BLOOD

## 2021-01-02 ENCOUNTER — Ambulatory Visit: Payer: Medicare HMO | Attending: Family Medicine

## 2021-01-02 ENCOUNTER — Other Ambulatory Visit: Payer: Self-pay

## 2021-01-02 DIAGNOSIS — R42 Dizziness and giddiness: Secondary | ICD-10-CM | POA: Insufficient documentation

## 2021-01-02 DIAGNOSIS — R2689 Other abnormalities of gait and mobility: Secondary | ICD-10-CM | POA: Diagnosis not present

## 2021-01-02 DIAGNOSIS — R29810 Facial weakness: Secondary | ICD-10-CM | POA: Insufficient documentation

## 2021-01-02 DIAGNOSIS — R5381 Other malaise: Secondary | ICD-10-CM | POA: Diagnosis not present

## 2021-01-02 DIAGNOSIS — R262 Difficulty in walking, not elsewhere classified: Secondary | ICD-10-CM | POA: Diagnosis not present
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
## 2021-01-09 ENCOUNTER — Other Ambulatory Visit: Payer: Self-pay

## 2021-01-09 ENCOUNTER — Ambulatory Visit: Payer: Medicare HMO

## 2021-01-09 DIAGNOSIS — R262 Difficulty in walking, not elsewhere classified: Secondary | ICD-10-CM

## 2021-01-09 DIAGNOSIS — R29810 Facial weakness: Secondary | ICD-10-CM | POA: Diagnosis not present

## 2021-01-09 DIAGNOSIS — R5381 Other malaise: Secondary | ICD-10-CM | POA: Diagnosis not present

## 2021-01-09 DIAGNOSIS — R2689 Other abnormalities of gait and mobility: Secondary | ICD-10-CM

## 2021-01-09 DIAGNOSIS — R42 Dizziness and giddiness: Secondary | ICD-10-CM

## 2021-01-09 NOTE — Therapy (Signed)
[place], [place], [address] Phone: [phone]   Fax:  [phone]  Physical Therapy Treatment  Patient Details  Name: [name] MRN: [id number] Date of Birth: September 27, 1950 Referring Provider (PT): Zenia Resides, MD   Encounter Date: 01/09/2021   PT End of Session - 01/09/21 1426     Visit Number 2    Number of Visits 9    Date for PT Re-Evaluation 03/09/21    Authorization Type AETNA MEDICARE HMO/PPO    Authorization Time Period Re-assess FOTO on the 6th and 11th visits    Progress Note Due on Visit 10    PT Start Time 1417    PT Stop Time 1500    PT Time Calculation (min) 43 min    Equipment Utilized During Treatment Other (comment)   [facility]   Activity Tolerance Patient tolerated treatment well    Behavior During Therapy [facility] for tasks assessed/performed             Past Medical History:  Diagnosis Date   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   Diabetes mellitus    DVT (deep venous thrombosis) (HCC)    Hypertension    PAD (peripheral artery disease) ([place])    Stroke ([place])    2008    Past Surgical History:  Procedure Laterality Date   COLONOSCOPY     None      There were no vitals filed for this visit.   Subjective Assessment - 01/09/21 1428     Subjective Pt reports he has been some exs everyday and he feels his energy is improving being able to walk a further distance yesterday.    Patient Stated Goals To be able to walk better: faster, longer, and with better balance    Currently in Pain? No/denies    Pain Score 0-No pain                [facility] PT Assessment - 01/09/21 0001       Balance   Balance Assessed Yes      Standardized Balance Assessment   Standardized Balance Assessment Berg Balance Test      Berg Balance Test   Sit to Stand  Able to stand  independently using hands    Standing Unsupported Able to stand 2 minutes with supervision    Sitting with Back Unsupported but Feet Supported on Floor or Stool Able to sit safely and securely 2 minutes    Stand to Sit Controls descent by using hands    Transfers Able to transfer safely, definite need of hands    Standing Unsupported with Eyes Closed Able to stand 10 seconds with supervision    Standing Unsupported with Feet Together Able to place feet together independently and stand for 1 minute with supervision    From Standing, Reach Forward with Outstretched Arm Can reach forward >12 cm safely (5")    From Standing Position, Pick up Object from Floor Able to pick up shoe, needs supervision    From Standing Position, Turn to Look Behind Over each Shoulder Turn sideways only but maintains balance    Turn 360 Degrees Needs close supervision or verbal cueing    Standing Unsupported, Alternately Place Feet on Step/Stool Needs assistance to keep from falling or unable to try    Standing Unsupported, One  Foot in [place] to take small step independently and hold 30 seconds    Standing on One Leg Tries to lift leg/unable to hold 3 seconds but remains standing independently    Total Score 34                           [facility] Adult PT Treatment/Exercise - 01/09/21 0001       Exercises   Exercises Knee/Hip      Knee/Hip Exercises: Standing   Heel Raises Both;10 reps    Heel Raises Limitations R ankle is limited to partial heel raises    Other Standing Knee Exercises Side steps along counter, 33f, 10x      Knee/Hip Exercises: Seated   Sit to Sand 10 reps                    PT Education - 01/09/21 1723     Education Details HEP update    Person(s) Educated Patient    Methods Explanation;Demonstration;Tactile cues;Verbal cues;Handout    Comprehension Verbalized understanding;Returned demonstration;Verbal cues required;Tactile cues required               PT Short Term Goals - 01/03/21 0625       PT SHORT TERM GOAL #1   Title Pt will be Ind in an initial HEP    Baseline started on eval    Status New    Target Date 01/24/21               PT Long Term Goals - 01/09/21 1730       Additional Long Term Goals   Additional Long Term Goals Yes      PT LONG TERM GOAL #6   Title Increase pt's Berg Balance score to 39/56 as indication of improved balance with mobility    Baseline 34/56    Status New    Target Date 03/09/21                   Plan - 01/09/21 1427     Clinical Impression Statement Pt is encouraged by the progress he has made with his walking. FOTO was reviewed and BMerrilee Janskycompleted indicating balance issues which can be addressed. Additionally, strengthening and balance exs were completed in standing. Pt tolerated the session without adverse effects.    Personal Factors and Comorbidities Age;Time since onset of injury/illness/exacerbation;Comorbidity 1;Comorbidity 2;Comorbidity 3+    Comorbidities L CVA, DM, DVT, PAD, obese    Examination-Activity Limitations Locomotion Level;Squat;Stairs;Stand;Transfers    Examination-Participation Restrictions Other    Stability/Clinical Decision Making Evolving/Moderate complexity    Clinical Decision Making Moderate    Rehab Potential Good    PT Frequency 1x / week    PT Duration 8 weeks    PT Treatment/Interventions ADLs/Self Care Home Management;Neuromuscular re-education;Balance training;Therapeutic exercise;Therapeutic activities;Functional mobility training;Stair training;Gait training;Patient/family education;Manual techniques;Dry needling;Taping;Joint Manipulations    PT Next Visit Plan Review FOTO. Assess response to HEP. Assess Berg and set LTG. Progress ther ex as tolerated    PT Home Exercise Plan [id number]    Consulted and Agree with Plan of Care Patient             Patient will benefit from skilled therapeutic intervention in order to improve  the following deficits and impairments:  Abnormal gait, Difficulty walking, Obesity, Impaired UE functional use, Decreased activity tolerance, Decreased balance, Decreased strength, Decreased mobility  Visit Diagnosis: Physical deconditioning  Dysequilibrium  Facial  weakness  Difficulty in walking, not elsewhere classified  Other abnormalities of gait and mobility     Problem List Patient Active Problem List   Diagnosis Date Noted   Pain due to onychomycosis of toenails of both feet 12/19/2020   Physical deconditioning 11/27/2020   Dysequilibrium 11/27/2020   Spastic hemiplegia of right dominant side due to cerebrovascular disease ([place]) 11/03/2017   Muscle wasting 09/25/2017   Depression 07/14/2017   Dysarthria as late effect of stroke 08/18/2016   Stage 2 chronic kidney disease    Leukocytosis    Benign essential HTN    Spastic hemiparesis ([place])    Aphasia due to recent cerebral infarction 07/04/2016   Cerebrovascular accident (CVA) ([place])    Altered mental status 06/30/2016   Fall 06/30/2016   Acute encephalopathy 06/30/2016   Sepsis ([place]) 06/30/2016   Diarrhea 06/30/2016   Acute renal failure superimposed on stage 2 chronic kidney disease ([place]) 06/30/2016   Hemiparesis affecting right side as late effect of stroke ([place]) 10/23/2015   PAD (peripheral artery disease) ([place]) 10/20/2011   Onychomycosis of great toe 09/10/2011   Hyperlipidemia 02/21/2010   CEREBROVASCULAR ACCIDENT, HX OF 12/22/2008   Diabetes mellitus, type II ([place]) 07/30/2006   OBESITY, NOS 07/30/2006   Essential hypertension, benign 07/30/2006   Gar Ponto MS, PT 01/09/21 5:32 PM   [facility] [address] [place], [place], [address] Phone: [phone]   Fax:  [phone]  Name: DEQUAVION DINAN MRN: [id number] Date of Birth: May 20, 1951

## 2021-01-16 ENCOUNTER — Ambulatory Visit (INDEPENDENT_AMBULATORY_CARE_PROVIDER_SITE_OTHER): Payer: Medicare HMO | Admitting: Family Medicine

## 2021-01-16 ENCOUNTER — Encounter: Payer: Self-pay | Admitting: Family Medicine

## 2021-01-16 ENCOUNTER — Other Ambulatory Visit: Payer: Self-pay

## 2021-01-16 VITALS — BP 177/61 | HR 54 | Ht 67.0 in | Wt 193.6 lb

## 2021-01-16 DIAGNOSIS — I10 Essential (primary) hypertension: Secondary | ICD-10-CM

## 2021-01-16 MED ORDER — AMLODIPINE BESYLATE 5 MG PO TABS: 5.0000 mg | ORAL_TABLET | Freq: Every day | ORAL | 0 refills | Status: DC

## 2021-01-16 NOTE — Patient Instructions (Signed)
It was great seeing you today!  Please check-out at the front desk before leaving the clinic. I'd like to see you back in 1 month  but if you need to be seen earlier than that for any new issues we're happy to fit you in, just give [place] a call!  Visit Remembers: - Stop by the pharmacy to pick up your prescriptions  - Continue to work on your healthy eating habits and incorporating exercise into your daily life.  - Your goal is to have an BP < 135/85 - Medicine Changes: Start amlodipine 5 mg once a day.    Call us if: you start to get lightheaded or dizzy.   Please bring all of your medications with you to each visit.    If you haven't already, sign up for My Chart to have easy access to your labs results, and communication with your primary care physician.  Feel free to call with any questions or concerns at any time, at [phone].   Take care,  Dr. Rushie [facility]

## 2021-01-16 NOTE — Progress Notes (Signed)
   SUBJECTIVE:   CHIEF COMPLAINT / HPI:   Chief Complaint  Patient presents with   Hypertension     [name] is a 70 y.o. male here for HTN follow up. He has been unable to check his BP at home as his cuff has not arrived. Reports taking his medication regularly. Denies chest pain, shortness of breath, lightheadedness or LE swelling.     PERTINENT  PMH / PSH: reviewed and updated as appropriate   OBJECTIVE:   BP (!) 177/61   Pulse (!) 54   Ht '5\' 7"'$  (1.702 m)   Wt 193 lb 9.6 oz (87.8 kg)   SpO2 99%   BMI 30.32 kg/m   Repeat in left arm  158/66   GEN: pleasant, elderly male, in no acute distress  CV: regular rate and rhythm RESP: no increased work of breathing, clear to ascultation bilaterally  SKIN: warm, dry    ASSESSMENT/PLAN:   Benign essential HTN Uncontrolled. BP not at goal. BP repeated after 15-30 minutes on left arm 158/66. Restart amlodipine. Continue Coreg 25 mg BID, Lisinopril 40 mg daily, Chlorthalidone 25 mg daily. Follow up in 1 month      Lyndee Hensen, DO PGY-3, [facility] 01/16/2021

## 2021-01-18 ENCOUNTER — Other Ambulatory Visit: Payer: Self-pay

## 2021-01-18 ENCOUNTER — Ambulatory Visit: Payer: Medicare HMO

## 2021-01-18 DIAGNOSIS — R262 Difficulty in walking, not elsewhere classified: Secondary | ICD-10-CM | POA: Diagnosis not present

## 2021-01-18 DIAGNOSIS — R2689 Other abnormalities of gait and mobility: Secondary | ICD-10-CM

## 2021-01-18 DIAGNOSIS — R42 Dizziness and giddiness: Secondary | ICD-10-CM

## 2021-01-18 DIAGNOSIS — R29810 Facial weakness: Secondary | ICD-10-CM | POA: Diagnosis not present

## 2021-01-18 DIAGNOSIS — R5381 Other malaise: Secondary | ICD-10-CM | POA: Diagnosis not present

## 2021-01-18 NOTE — Therapy (Signed)
[place], [place], [address] Phone: [phone]   Fax:  [phone]  Physical Therapy Treatment  Patient Details  Name: [name] MRN: [id number] Date of Birth: Mar 16, 1951 Referring Provider (PT): Zenia Resides, MD   Encounter Date: 01/18/2021   PT End of Session - 01/18/21 1149     Visit Number 3    Number of Visits 9    Date for PT Re-Evaluation 03/09/21    Authorization Type [employer] MEDICARE HMO/PPO    Authorization Time Period Re-assess FOTO on the 6th and 11th visits    Progress Note Due on Visit 10    PT Start Time 1148    PT Stop Time 1233    PT Time Calculation (min) 45 min    Equipment Utilized During Treatment Other (comment)   LBQC   Activity Tolerance Patient tolerated treatment well    Behavior During Therapy [facility] for tasks assessed/performed             Past Medical History:  Diagnosis Date   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   Diabetes mellitus    DVT (deep venous thrombosis) (HCC)    Hypertension    PAD (peripheral artery disease) ([place])    Stroke ([place])    2008    Past Surgical History:  Procedure Laterality Date   COLONOSCOPY     None      There were no vitals filed for this visit.   Subjective Assessment - 01/18/21 1155     Subjective Pt reports his confidence with walking is better with steadiness and distance tolerance.                               [facility] Adult PT Treatment/Exercise - 01/18/21 0001       Exercises   Exercises Knee/Hip      Knee/Hip Exercises: Aerobic   Nustep 6 mins, L4, LUE/LEs      Knee/Hip Exercises: Standing   Heel Raises Both;10 reps    Heel Raises Limitations R ankle is limited to partial heel raises    Other Standing Knee Exercises Side steps along counter, 75f, 10x       Knee/Hip Exercises: Seated   Sit to Sand 2 sets;10 reps                 Balance Exercises - 01/18/21 0001       Balance Exercises: Standing   Standing Eyes Opened Narrow base of support (BOS);Foam/compliant surface;Solid surface;3 reps;20 secs   head nodding and turning   Standing Eyes Closed Narrow base of support (BOS);3 reps;30 secs;Solid surface   heading nodding and turning                PT Short Term Goals - 01/03/21 0625       PT SHORT TERM GOAL #1   Title Pt will be Ind in an initial HEP    Baseline started on eval    Status New    Target Date 01/24/21               PT Long Term Goals - 01/09/21 1730       Additional Long Term Goals   Additional Long Term Goals Yes      PT LONG  TERM GOAL #6   Title Increase pt's Berg Balance score to 39/56 as indication of improved balance with mobility    Baseline 34/56    Status New    Target Date 03/09/21                   Plan - 01/18/21 1150     Clinical Impression Statement PT was completed for LE strenghtening and balance. Pt demonstrated good balance with feet close together and eyes closed and with feet together on a soft mat with eyes open. The Nu-Stepwas also completed with the pt tolerating at a level 4 resistance for 6 mins. Pt notes he his confidence with walking has improved re: both balance and distance tolerance. Pt will continue to benefit from skilled PT to improve strength and balance to optimize functional mobility and safety.    Personal Factors and Comorbidities Age;Time since onset of injury/illness/exacerbation;Comorbidity 1;Comorbidity 2;Comorbidity 3+    Comorbidities L CVA, DM, DVT, PAD, obese    Examination-Activity Limitations Locomotion Level;Squat;Stairs;Stand;Transfers    Examination-Participation Restrictions Other    Stability/Clinical Decision Making Evolving/Moderate complexity    Clinical Decision Making Moderate    Rehab Potential Good    PT Frequency 1x /  week    PT Duration 8 weeks    PT Treatment/Interventions ADLs/Self Care Home Management;Neuromuscular re-education;Balance training;Therapeutic exercise;Therapeutic activities;Functional mobility training;Stair training;Gait training;Patient/family education;Manual techniques;Dry needling;Taping;Joint Manipulations    PT Next Visit Plan Review FOTO. Assess response to HEP. Assess Berg and set LTG. Progress ther ex as tolerated    PT Home Exercise Plan [id number]    Consulted and Agree with Plan of Care Patient             Patient will benefit from skilled therapeutic intervention in order to improve the following deficits and impairments:  Abnormal gait, Difficulty walking, Obesity, Impaired UE functional use, Decreased activity tolerance, Decreased balance, Decreased strength, Decreased mobility  Visit Diagnosis: Physical deconditioning  Dysequilibrium  Difficulty in walking, not elsewhere classified  Other abnormalities of gait and mobility     Problem List Patient Active Problem List   Diagnosis Date Noted   Pain due to onychomycosis of toenails of both feet 12/19/2020   Physical deconditioning 11/27/2020   Dysequilibrium 11/27/2020   Spastic hemiplegia of right dominant side due to cerebrovascular disease ([place]) 11/03/2017   Muscle wasting 09/25/2017   Depression 07/14/2017   Dysarthria as late effect of stroke 08/18/2016   Stage 2 chronic kidney disease    Leukocytosis    Benign essential HTN    Spastic hemiparesis ([place])    Aphasia due to recent cerebral infarction 07/04/2016   Cerebrovascular accident (CVA) ([place])    Altered mental status 06/30/2016   Fall 06/30/2016   Acute encephalopathy 06/30/2016   Sepsis ([place]) 06/30/2016   Diarrhea 06/30/2016   Acute renal failure superimposed on stage 2 chronic kidney disease ([place]) 06/30/2016   Hemiparesis affecting right side as late effect of stroke ([place]) 10/23/2015   PAD (peripheral artery disease) ([place]) 10/20/2011    Onychomycosis of great toe 09/10/2011   Hyperlipidemia 02/21/2010   CEREBROVASCULAR ACCIDENT, HX OF 12/22/2008   Diabetes mellitus, type II ([place]) 07/30/2006   OBESITY, NOS 07/30/2006   Essential hypertension, benign 07/30/2006   Gar Ponto MS, PT 01/18/21 1:10 PM   [facility] [address] [place], [place], [address] Phone: [phone]   Fax:  [phone]  Name: ROLAND LEMMEN MRN: [id number] Date of Birth: [birth year]

## 2021-01-21 NOTE — Assessment & Plan Note (Signed)
Uncontrolled. BP not at goal. BP repeated after 15-30 minutes on left arm 158/66. Restart amlodipine. Continue Coreg 25 mg BID, Lisinopril 40 mg daily, Chlorthalidone 25 mg daily. Follow up in 1 month

## 2021-01-23 ENCOUNTER — Other Ambulatory Visit: Payer: Self-pay

## 2021-01-23 ENCOUNTER — Ambulatory Visit: Payer: Medicare HMO

## 2021-01-23 DIAGNOSIS — R2689 Other abnormalities of gait and mobility: Secondary | ICD-10-CM

## 2021-01-23 DIAGNOSIS — R29810 Facial weakness: Secondary | ICD-10-CM | POA: Diagnosis not present

## 2021-01-23 DIAGNOSIS — R42 Dizziness and giddiness: Secondary | ICD-10-CM

## 2021-01-23 DIAGNOSIS — R262 Difficulty in walking, not elsewhere classified: Secondary | ICD-10-CM | POA: Diagnosis not present

## 2021-01-23 DIAGNOSIS — R5381 Other malaise: Secondary | ICD-10-CM

## 2021-01-23 NOTE — Therapy (Signed)
[place], [place], [address] Phone: [phone]   Fax:  [phone]  Physical Therapy Treatment  Patient Details  Name: [name] MRN: [id number] Date of Birth: February 27, 1951 Referring Provider (PT): Zenia Resides, MD   Encounter Date: 01/23/2021   PT End of Session - 01/23/21 1438     Visit Number 4    Number of Visits 9    Date for PT Re-Evaluation 03/09/21    Authorization Type AET[employer] MEDICARE HMO/PPO    Authorization Time Period Re-assess FOTO on the 6th and 11th visits    Progress Note Due on Visit 10    PT Start Time 1425    PT Stop Time 1505    PT Time Calculation (min) 40 min    Equipment Utilized During Treatment Other (comment)   LBQC   Activity Tolerance Patient tolerated treatment well    Behavior During Therapy [facility] for tasks assessed/performed             Past Medical History:  Diagnosis Date   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   Diabetes mellitus    DVT (deep venous thrombosis) (HCC)    Hypertension    PAD (peripheral artery disease) ([place])    Stroke ([place])    2008    Past Surgical History:  Procedure Laterality Date   COLONOSCOPY     None      There were no vitals filed for this visit.   Subjective Assessment - 01/23/21 1431     Subjective Pt reports he has more energy. Pt reports compliance with his HEP.    Patient Stated Goals To be able to walk better: faster, longer, and with better balance    Currently in Pain? No/denies                               [facility] Adult PT Treatment/Exercise - 01/23/21 0001       Exercises   Exercises Knee/Hip      Knee/Hip Exercises: Standing   Other Standing Knee Exercises Side steps, forward/backward steps c HHA, 101f, 2x      Knee/Hip Exercises: Seated   Long Arc Quad  Right;Left;2 sets;10 reps    Long Arc Quad Weight 4 lbs.   L   Clamshell with TheraBand Red   10x2, for side steps   Marching Right;Left;2 sets;10 reps    Hamstring Curl Right;Left;2 sets;10 reps    Hamstring Limitations red Tband    Sit to Sand 10 reps;without UE support   to standing tall                     PT Short Term Goals - 01/23/21 1511       PT SHORT TERM GOAL #1   Title Pt will be Ind in an initial HEP    Status Achieved    Target Date 01/23/21               PT Long Term Goals - 01/09/21 1730       Additional Long Term Goals   Additional Long Term Goals Yes      PT LONG TERM GOAL #6   Title Increase pt's Berg Balance score to 39/56 as indication of improved balance with mobility  Baseline 34/56    Status New    Target Date 03/09/21                   Plan - 01/23/21 1437     Clinical Impression Statement Pt participated in PT for LE strengthening, balance, and functional mobility. With review of exercises pt completed with proper technique. Pt tolerated today's PT session without advserse effects.    Personal Factors and Comorbidities Age;Time since onset of injury/illness/exacerbation;Comorbidity 1;Comorbidity 2;Comorbidity 3+    Comorbidities L CVA, DM, DVT, PAD, obese    Examination-Activity Limitations Locomotion Level;Squat;Stairs;Stand;Transfers    Examination-Participation Restrictions Other   ADLs   Stability/Clinical Decision Making Evolving/Moderate complexity    Clinical Decision Making Moderate    Rehab Potential Good    PT Frequency 1x / week    PT Duration 6 weeks    PT Treatment/Interventions ADLs/Self Care Home Management;Neuromuscular re-education;Balance training;Therapeutic exercise;Therapeutic activities;Functional mobility training;Stair training;Gait training;Patient/family education;Manual techniques;Dry needling;Taping;Joint Manipulations    PT Next Visit Plan Review FOTO. Assess response to HEP. Re-assess  2MWT and TUG on 01/30/21.    PT Home Exercise Plan [id number]    Consulted and Agree with Plan of Care Patient             Patient will benefit from skilled therapeutic intervention in order to improve the following deficits and impairments:  Abnormal gait, Difficulty walking, Obesity, Impaired UE functional use, Decreased activity tolerance, Decreased balance, Decreased strength, Decreased mobility  Visit Diagnosis: Physical deconditioning  Dysequilibrium  Difficulty in walking, not elsewhere classified  Other abnormalities of gait and mobility     Problem List Patient Active Problem List   Diagnosis Date Noted   Pain due to onychomycosis of toenails of both feet 12/19/2020   Physical deconditioning 11/27/2020   Dysequilibrium 11/27/2020   Spastic hemiplegia of right dominant side due to cerebrovascular disease ([place]) 11/03/2017   Muscle wasting 09/25/2017   Depression 07/14/2017   Dysarthria as late effect of stroke 08/18/2016   Stage 2 chronic kidney disease    Leukocytosis    Benign essential HTN    Spastic hemiparesis ([place])    Aphasia due to recent cerebral infarction 07/04/2016   Cerebrovascular accident (CVA) ([place])    Altered mental status 06/30/2016   Fall 06/30/2016   Acute encephalopathy 06/30/2016   Sepsis ([place]) 06/30/2016   Diarrhea 06/30/2016   Acute renal failure superimposed on stage 2 chronic kidney disease ([place]) 06/30/2016   Hemiparesis affecting right side as late effect of stroke ([place]) 10/23/2015   PAD (peripheral artery disease) ([place]) 10/20/2011   Onychomycosis of great toe 09/10/2011   Hyperlipidemia 02/21/2010   CEREBROVASCULAR ACCIDENT, HX OF 12/22/2008   Diabetes mellitus, type II ([place]) 07/30/2006   OBESITY, NOS 07/30/2006   Essential hypertension, benign 07/30/2006    Gar Ponto MS, PT 01/23/21 3:18 PM   [facility] [address] [place], [place], [address] Phone: [phone]   Fax:   [phone]  Name: [name] MRN: [id number] Date of Birth: 1950/11/07

## 2021-01-30 ENCOUNTER — Other Ambulatory Visit: Payer: Self-pay

## 2021-01-30 ENCOUNTER — Ambulatory Visit: Payer: Medicare HMO

## 2021-01-30 DIAGNOSIS — R42 Dizziness and giddiness: Secondary | ICD-10-CM | POA: Diagnosis not present

## 2021-01-30 DIAGNOSIS — R2689 Other abnormalities of gait and mobility: Secondary | ICD-10-CM | POA: Diagnosis not present

## 2021-01-30 DIAGNOSIS — R5381 Other malaise: Secondary | ICD-10-CM | POA: Diagnosis not present

## 2021-01-30 DIAGNOSIS — R262 Difficulty in walking, not elsewhere classified: Secondary | ICD-10-CM

## 2021-01-30 DIAGNOSIS — R29810 Facial weakness: Secondary | ICD-10-CM | POA: Diagnosis not present

## 2021-01-30 NOTE — Therapy (Signed)
[place], [place], [address] Phone: [phone]   Fax:  [phone]  Physical Therapy Treatment  Patient Details  Name: [name] MRN: [id number] Date of Birth: 1950-10-13 Referring Provider (PT): Zenia Resides, MD   Encounter Date: 01/30/2021   PT End of Session - 01/30/21 1657     Visit Number 5    Number of Visits 9    Date for PT Re-Evaluation 03/09/21    Authorization Type AETNA MEDICARE HMO/PPO    Authorization Time Period Re-assess FOTO on the 6th and 11th visits    Progress Note Due on Visit 10    PT Start Time 1428    PT Stop Time 1506    PT Time Calculation (min) 38 min    Equipment Utilized During Treatment Other (comment)   LBQC   Activity Tolerance Patient tolerated treatment well    Behavior During Therapy 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   Diabetes mellitus    DVT (deep venous thrombosis) (HCC)    Hypertension    PAD (peripheral artery disease) ([place])    Stroke ([place])    2008    Past Surgical History:  Procedure Laterality Date   COLONOSCOPY     None      There were no vitals filed for this visit.   Subjective Assessment - 01/30/21 1656     Subjective Pt reports he is doing well today    Patient Stated Goals To be able to walk better: faster, longer, and with better balance    Currently in Pain? No/denies    Pain Score 0-No pain                [facility] PT Assessment - 01/30/21 0001       Ambulation/Gait   Gait Comments 39mt = 1253f     Timed Up and Go Test   Normal TUG (seconds) 31.1   c LBQC                          [facility] Adult PT Treatment/Exercise - 01/30/21 0001       Knee/Hip Exercises: Standing   Hip  Abduction Right;Left;10 reps    Abduction Limitations counter    Hip Extension Right;Left;10 reps    Extension Limitations counter    Lateral Step Up Left;1 set;10 reps;Hand Hold: 1    Other Standing Knee Exercises Side steps, 5 ft, 8x, at counter      Knee/Hip Exercises: Seated   Long Arc Quad Right;Left;2 sets;10 reps    Long Arc Quad Weight 4 lbs.   L   Sit to Sand 2 sets;10 reps;with UE support   to standing tall                     PT Short Term Goals - 01/23/21 1511       PT SHORT TERM GOAL #1   Title Pt will be Ind in an initial HEP    Status Achieved    Target Date 01/23/21               PT Long Term Goals - 01/30/21 1700  PT LONG TERM GOAL #3   Title Pt will improve TUG score to less than or equal to 30 seconds for decreased fall risk. 01/30/21: 31.1    Baseline 36.9 sec c LBQC    Status On-going    Target Date 03/09/21      PT LONG TERM GOAL #5   Title Improve 61mt distance to 130 ft or greater for improved walking pace and functional mobility. 01/30/21: 1270fc LBQC    Status On-going    Target Date 03/09/21                   Plan - 01/30/21 1658     Clinical Impression Statement PT continues to work on LE strength and balance. Re-assessed the TUG and the 2MWT. Pt demonstrated appropriate improvement  in these measures. Pt wil continue to benfit from skilled PT to address strength and balance deficits to optimize pt's funtional mobility and safety.    Personal Factors and Comorbidities Age;Time since onset of injury/illness/exacerbation;Comorbidity 1;Comorbidity 2;Comorbidity 3+    Comorbidities L CVA, DM, DVT, PAD, obese    Examination-Activity Limitations Locomotion Level;Squat;Stairs;Stand;Transfers    Examination-Participation Restrictions Other   ADLs   Stability/Clinical Decision Making Evolving/Moderate complexity    Clinical Decision Making Moderate    Rehab Potential Good    PT Frequency 1x / week    PT Duration 6  weeks    PT Treatment/Interventions ADLs/Self Care Home Management;Neuromuscular re-education;Balance training;Therapeutic exercise;Therapeutic activities;Functional mobility training;Stair training;Gait training;Patient/family education;Manual techniques;Dry needling;Taping;Joint Manipulations    PT Next Visit Plan Review FOTO. Assess response to HEP. Re-assess 2MWT and TUG on 01/30/21.    PT Home Exercise Plan [id number]    Consulted and Agree with Plan of Care Patient             Patient will benefit from skilled therapeutic intervention in order to improve the following deficits and impairments:  Abnormal gait, Difficulty walking, Obesity, Impaired UE functional use, Decreased activity tolerance, Decreased balance, Decreased strength, Decreased mobility  Visit Diagnosis: Physical deconditioning  Dysequilibrium  Difficulty in walking, not elsewhere classified  Other abnormalities of gait and mobility     Problem List Patient Active Problem List   Diagnosis Date Noted   Pain due to onychomycosis of toenails of both feet 12/19/2020   Physical deconditioning 11/27/2020   Dysequilibrium 11/27/2020   Spastic hemiplegia of right dominant side due to cerebrovascular disease ([facility]06/08/2017   Muscle wasting 09/25/2017   Depression 07/14/2017   Dysarthria as late effect of stroke 08/18/2016   Stage 2 chronic kidney disease    Leukocytosis    Benign essential HTN    Spastic hemiparesis ([place]   Aphasia due to recent cerebral infarction 07/04/2016   Cerebrovascular accident (CVA) ([facility]   Altered mental status 06/30/2016   Fall 06/30/2016   Acute encephalopathy 06/30/2016   Sepsis ([facility]01/29/2018   Diarrhea 06/30/2016   Acute renal failure superimposed on stage 2 chronic kidney disease ([facility]CP[place]01/29/2018   Hemiparesis affecting right side as late effect of stroke (HCWay[facility]05/23/2017   PAD (peripheral artery disease) (HCDonip[place]05/20/2013   Onychomycosis of great toe 09/10/2011    Hyperlipidemia 02/21/2010   CEREBROVASCULAR ACCIDENT, HX OF 12/22/2008   Diabetes mellitus, type II (HCAshwauben[place]02/28/2008   OBESITY, NOS 07/30/2006   Essential hypertension, benign 07/30/2006    AlGar PontoS, PT 01/30/21 5:13 PM   [facility] [place]: [phone]  Fax:  [phone]  Name: GERMAN CHAKRABORTY MRN: [id number] Date of Birth: 01/26/1951

## 2021-02-06 ENCOUNTER — Other Ambulatory Visit: Payer: Self-pay

## 2021-02-06 ENCOUNTER — Ambulatory Visit: Payer: Medicare HMO | Attending: Family Medicine

## 2021-02-06 DIAGNOSIS — R262 Difficulty in walking, not elsewhere classified: Secondary | ICD-10-CM | POA: Diagnosis not present

## 2021-02-06 DIAGNOSIS — R42 Dizziness and giddiness: Secondary | ICD-10-CM | POA: Diagnosis not present

## 2021-02-06 DIAGNOSIS — R2689 Other abnormalities of gait and mobility: Secondary | ICD-10-CM | POA: Diagnosis not present

## 2021-02-06 DIAGNOSIS — R5381 Other malaise: Secondary | ICD-10-CM | POA: Diagnosis not present

## 2021-02-06 DIAGNOSIS — R29810 Facial weakness: Secondary | ICD-10-CM | POA: Diagnosis not present

## 2021-02-06 NOTE — Therapy (Signed)
[place], [place], [address] Phone: [phone]   Fax:  [phone]  Physical Therapy Treatment  Patient Details  Name: [name] MRN: [id number] Date of Birth: 1951/04/26 Referring Provider (PT): Zenia Resides, MD   Encounter Date: 02/06/2021   PT End of Session - 02/06/21 1403     Visit Number 6    Number of Visits 9    Date for PT Re-Evaluation 03/09/21    Authorization Type AETNA MEDICARE HMO/PPO    Authorization Time Period Re-assess FOTO on the 6th and 11th visits    Progress Note Due on Visit 10    PT Start Time 1410    PT Stop Time 1455    PT Time Calculation (min) 45 min    Equipment Utilized During Treatment Other (comment)   LBQC   Activity Tolerance Patient tolerated treatment well    Behavior During Therapy [facility] for tasks assessed/performed             Past Medical History:  Diagnosis Date   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   Diabetes mellitus    DVT (deep venous thrombosis) (HCC)    Hypertension    PAD (peripheral artery disease) ([place])    Stroke ([place])    2008    Past Surgical History:  Procedure Laterality Date   COLONOSCOPY     None      There were no vitals filed for this visit.   Subjective Assessment - 02/06/21 1413     Subjective Pt reports he over did his exs the other day and he is a little stiff.    Patient Stated Goals To be able to walk better: faster, longer, and with better balance    Currently in Pain? No/denies                               [facility] Adult PT Treatment/Exercise - 02/06/21 0001       Exercises   Exercises Knee/Hip      Knee/Hip Exercises: Standing   Other Standing Knee Exercises Side steps, 5 ft, 8x, at counter    Other Standing Knee Exercises Marching, airex, 20x2      Knee/Hip  Exercises: Seated   Long Arc Quad Right;Left;2 sets;10 reps    Long Arc Quad Weight 4 lbs.   L   Clamshell with TheraBand Red   10x2, for side steps   Sit to Sand 2 sets;10 reps;with UE support   to standing tall                 Upper Extremity Functional Index Score :   /80   PT Education - 02/06/21 1453     Education Details Pt complete FOTO and results were reviewed    Person(s) Educated Patient    Methods Explanation    Comprehension Verbalized understanding              PT Short Term Goals - 01/23/21 1511       PT SHORT TERM GOAL #1   Title Pt will be Ind in an initial HEP    Status Achieved    Target Date 01/23/21               PT Long Term Goals -  01/30/21 1700       PT LONG TERM GOAL #3   Title Pt will improve TUG score to less than or equal to 30 seconds for decreased fall risk. 01/30/21: 31.1    Baseline 36.9 sec c LBQC    Status On-going    Target Date 03/09/21      PT LONG TERM GOAL #5   Title Improve 45mt distance to 130 ft or greater for improved walking pace and functional mobility. 01/30/21: 1231fc LBQC    Status On-going    Target Date 03/09/21                   Plan - 02/06/21 1405     Clinical Impression Statement Pt's FOTO was re-assessed with pt reached the predicted function level. FOTO score is consistent with pt's subjective report of having better energy. PT was continued to address LE strength and balance. Pt tolerated today's session without adverse effects.    Personal Factors and Comorbidities Age;Time since onset of injury/illness/exacerbation;Comorbidity 1;Comorbidity 2;Comorbidity 3+    Comorbidities L CVA, DM, DVT, PAD, obese    Examination-Activity Limitations Locomotion Level;Squat;Stairs;Stand;Transfers    Examination-Participation Restrictions Other    Stability/Clinical Decision Making Evolving/Moderate complexity    Clinical Decision Making Moderate    Rehab Potential Good    PT Frequency 1x / week     PT Duration 6 weeks    PT Treatment/Interventions ADLs/Self Care Home Management;Neuromuscular re-education;Balance training;Therapeutic exercise;Therapeutic activities;Functional mobility training;Stair training;Gait training;Patient/family education;Manual techniques;Dry needling;Taping;Joint Manipulations    PT Next Visit Plan Continue strengthening and balance activities to improve function. Will also address pt's ambulation tolerance.    PT Home Exercise Plan [id number]    Consulted and Agree with Plan of Care Patient             Patient will benefit from skilled therapeutic intervention in order to improve the following deficits and impairments:  Abnormal gait, Difficulty walking, Obesity, Impaired UE functional use, Decreased activity tolerance, Decreased balance, Decreased strength, Decreased mobility  Visit Diagnosis: Physical deconditioning  Dysequilibrium  Difficulty in walking, not elsewhere classified  Other abnormalities of gait and mobility  Facial weakness     Problem List Patient Active Problem List   Diagnosis Date Noted   Pain due to onychomycosis of toenails of both feet 12/19/2020   Physical deconditioning 11/27/2020   Dysequilibrium 11/27/2020   Spastic hemiplegia of right dominant side due to cerebrovascular disease (HCGuaynabo06/08/2017   Muscle wasting 09/25/2017   Depression 07/14/2017   Dysarthria as late effect of stroke 08/18/2016   Stage 2 chronic kidney disease    Leukocytosis    Benign essential HTN    Spastic hemiparesis (HCS[place]   Aphasia due to recent cerebral infarction 07/04/2016   Cerebrovascular accident (CVA) (HCCoker   Altered mental status 06/30/2016   Fall 06/30/2016   Acute encephalopathy 06/30/2016   Sepsis (HCUtica01/29/2018   Diarrhea 06/30/2016   Acute renal failure superimposed on stage 2 chronic kidney disease (HCHub[place]01/29/2018   Hemiparesis affecting right side as late effect of stroke (HCRiverton05/23/2017   PAD (peripheral  artery disease) (HCDelmit[place]05/20/2013   Onychomycosis of great toe 09/10/2011   Hyperlipidemia 02/21/2010   CEREBROVASCULAR ACCIDENT, HX OF 12/22/2008   Diabetes mellitus, type II (HCWestchester02/28/2008   OBESITY, NOS 07/30/2006   Essential hypertension, benign 07/30/2006    AlGar PontoS, PT 02/06/21 3:04 PM   [place] [address] Phone: [phone]   Fax:  [phone]  Name: [name] MRN: [id number] Date of Birth: 04-Oct-1950

## 2021-02-13 ENCOUNTER — Ambulatory Visit: Payer: Medicare HMO

## 2021-02-13 ENCOUNTER — Other Ambulatory Visit: Payer: Self-pay

## 2021-02-13 DIAGNOSIS — R42 Dizziness and giddiness: Secondary | ICD-10-CM

## 2021-02-13 DIAGNOSIS — R2689 Other abnormalities of gait and mobility: Secondary | ICD-10-CM | POA: Diagnosis not present

## 2021-02-13 DIAGNOSIS — R29810 Facial weakness: Secondary | ICD-10-CM

## 2021-02-13 DIAGNOSIS — R262 Difficulty in walking, not elsewhere classified: Secondary | ICD-10-CM | POA: Diagnosis not present

## 2021-02-13 DIAGNOSIS — R5381 Other malaise: Secondary | ICD-10-CM

## 2021-02-13 NOTE — Therapy (Signed)
[place], [place], [address] Phone: [phone]   Fax:  [phone]  Physical Therapy Treatment  Patient Details  Name: [name] MRN: [id number] Date of Birth: [birth year] Referring Provider (PT): Zenia Resides, MD   Encounter Date: 02/13/2021   PT End of Session - 02/13/21 1513     Visit Number 7    Number of Visits 9    Date for PT Re-Evaluation 03/09/21    Authorization Type AETNA MEDICARE HMO/PPO    Authorization Time Period Re-assess FOTO on the 6th and 11th visits    Progress Note Due on Visit 10    PT Start Time 1503    PT Stop Time 1545    PT Time Calculation (min) 42 min    Equipment Utilized During Treatment Other (comment)   LBQC   Activity Tolerance Patient tolerated treatment well    Behavior During Therapy [facility] for tasks assessed/performed             Past Medical History:  Diagnosis Date   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   Diabetes mellitus    DVT (deep venous thrombosis) (HCC)    Hypertension    PAD (peripheral artery disease) ([place])    Stroke ([place])    2008    Past Surgical History:  Procedure Laterality Date   COLONOSCOPY     None      There were no vitals filed for this visit.   Subjective Assessment - 02/13/21 1511     Subjective I've been working on my R leg more since it is the weakest one.    Patient Stated Goals To be able to walk better: faster, longer, and with better balance    Currently in Pain? Yes    Pain Score 0-No pain                               [facility] Adult PT Treatment/Exercise - 02/13/21 0001       Exercises   Exercises Knee/Hip      Knee/Hip Exercises: Aerobic   Nustep 7 mins, L5, Les      Knee/Hip Exercises: Standing   Lateral Step Up Left;1 set;20 reps;Hand Hold: 1;Step Height:  4"      Knee/Hip Exercises: Seated   Long Arc Quad Right;Left;2 sets;10 reps    Long Arc Quad Weight 4 lbs.   L   Clamshell with TheraBand Red   10x2, for side steps   Sit to Sand 2 sets;10 reps;with UE support   to standing tall                      PT Short Term Goals - 01/23/21 1511       PT SHORT TERM GOAL #1   Title Pt will be Ind in an initial HEP    Status Achieved    Target Date 01/23/21               PT Long Term Goals - 01/30/21 1700       PT LONG TERM GOAL #3   Title Pt will improve TUG score to less than or equal to 30 seconds for decreased fall risk. 01/30/21: 31.1    Baseline 36.9 sec c LBQC  Status On-going    Target Date 03/09/21      PT LONG TERM GOAL #5   Title Improve 58mt distance to 130 ft or greater for improved walking pace and functional mobility. 01/30/21: 1231fc LBQC    Status On-going    Target Date 03/09/21                   Plan - 02/13/21 1529     Clinical Impression Statement Pt participated in PT for LE strengthening, activity tolerance, and balance. Pt was able to tolerate progression to lateral side steps from 2" to 4". Pt tolerated the session without adverse effects. Pt will continue to benefit from skilled PT to improve pt's functional ability and tolerance.    Personal Factors and Comorbidities Age;Time since onset of injury/illness/exacerbation;Comorbidity 1;Comorbidity 2;Comorbidity 3+    Comorbidities L CVA, DM, DVT, PAD, obese    Examination-Activity Limitations Locomotion Level;Squat;Stairs;Stand;Transfers    Examination-Participation Restrictions Other    Stability/Clinical Decision Making Evolving/Moderate complexity    Clinical Decision Making Moderate    Rehab Potential Good    PT Frequency 1x / week    PT Duration 6 weeks    PT Treatment/Interventions ADLs/Self Care Home Management;Neuromuscular re-education;Balance training;Therapeutic exercise;Therapeutic activities;Functional mobility  training;Stair training;Gait training;Patient/family education;Manual techniques;Dry needling;Taping;Joint Manipulations    PT Next Visit Plan Continue strengthening and balance activities to improve function. Reassess 5xSTS and LE strength.    PT Home Exercise Plan [id number]    Consulted and Agree with Plan of Care Patient             Patient will benefit from skilled therapeutic intervention in order to improve the following deficits and impairments:  Abnormal gait, Difficulty walking, Obesity, Impaired UE functional use, Decreased activity tolerance, Decreased balance, Decreased strength, Decreased mobility  Visit Diagnosis: Physical deconditioning  Dysequilibrium  Difficulty in walking, not elsewhere classified  Other abnormalities of gait and mobility  Facial weakness     Problem List Patient Active Problem List   Diagnosis Date Noted   Pain due to onychomycosis of toenails of both feet 12/19/2020   Physical deconditioning 11/27/2020   Dysequilibrium 11/27/2020   Spastic hemiplegia of right dominant side due to cerebrovascular disease (HCRiverside06/08/2017   Muscle wasting 09/25/2017   Depression 07/14/2017   Dysarthria as late effect of stroke 08/18/2016   Stage 2 chronic kidney disease    Leukocytosis    Benign essential HTN    Spastic hemiparesis (HCBrock[place]   Aphasia due to recent cerebral infarction 07/04/2016   Cerebrovascular accident (CVA) (HCValley-Hi   Altered mental status 06/30/2016   Fall 06/30/2016   Acute encephalopathy 06/30/2016   Sepsis (HCLa Joya01/29/2018   Diarrhea 06/30/2016   Acute renal failure superimposed on stage 2 chronic kidney disease (HCWilliamsburg01/29/2018   Hemiparesis affecting right side as late effect of stroke (HCPlaza05/23/2017   PAD (peripheral artery disease) (HCCastor05/20/2013   Onychomycosis of great toe 09/10/2011   Hyperlipidemia 02/21/2010   CEREBROVASCULAR ACCIDENT, HX OF 12/22/2008   Diabetes mellitus, type II (HCNew Hope02/28/2008   OBESITY, NOS  07/30/2006   Essential hypertension, benign 07/30/2006    AlGar PontoPT 02/13/2021, 5:[facility]hone: [phone] Fax:  [phone]: WiKAMEN HOSTETLERRN: [id number] of Birth: 02/1951-03-13

## 2021-02-19 ENCOUNTER — Ambulatory Visit: Payer: Medicare HMO | Admitting: Family Medicine

## 2021-02-20 ENCOUNTER — Other Ambulatory Visit: Payer: Self-pay

## 2021-02-20 ENCOUNTER — Ambulatory Visit: Payer: Medicare HMO

## 2021-02-20 DIAGNOSIS — R2689 Other abnormalities of gait and mobility: Secondary | ICD-10-CM | POA: Diagnosis not present

## 2021-02-20 DIAGNOSIS — R262 Difficulty in walking, not elsewhere classified: Secondary | ICD-10-CM | POA: Diagnosis not present

## 2021-02-20 DIAGNOSIS — R5381 Other malaise: Secondary | ICD-10-CM

## 2021-02-20 DIAGNOSIS — R42 Dizziness and giddiness: Secondary | ICD-10-CM

## 2021-02-20 DIAGNOSIS — R29810 Facial weakness: Secondary | ICD-10-CM | POA: Diagnosis not present

## 2021-02-20 NOTE — Therapy (Addendum)
[place], [place], [address] Phone: [phone]   Fax:  [phone]  Physical Therapy Treatment/Discharge  Patient Details  Name: [name] MRN: [id number] Date of Birth: September 14, 1950 Referring Provider (PT): Zenia Resides, MD   Encounter Date: 02/20/2021   PT End of Session - 02/20/21 1444     Visit Number 8    Number of Visits 9    Date for PT Re-Evaluation 03/09/21    Authorization Type AETNA MEDICARE HMO/PPO    Authorization Time Period Re-assess FOTO on the 6th and 11th visits    PT Start Time 1421    PT Stop Time 1500    PT Time Calculation (min) 39 min             Past Medical History:  Diagnosis Date   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   Diabetes mellitus    DVT (deep venous thrombosis) (HCC)    Hypertension    PAD (peripheral artery disease) ([place])    Stroke ([place])    2008    Past Surgical History:  Procedure Laterality Date   COLONOSCOPY     None      There were no vitals filed for this visit.   Subjective Assessment - 02/20/21 1423     Subjective I have alot more confidence getting around than I used to have,    Patient Stated Goals To be able to walk better: faster, longer, and with better balance    Currently in Pain? No/denies    Pain Score 0-No pain                [facility] PT Assessment - 02/20/21 0001       Transfers   Five time sit to stand comments  12 sec   c R hand     Berg Balance Test   Sit to Stand Able to stand  independently using hands    Standing Unsupported Able to stand 2 minutes with supervision    Sitting with Back Unsupported but Feet Supported on Floor or Stool Able to sit safely and securely 2 minutes    Stand to Sit Controls descent by using hands    Transfers Able to transfer safely, definite need of  hands    Standing Unsupported with Eyes Closed Able to stand 10 seconds safely    Standing Unsupported with Feet Together Able to place feet together independently and stand 1 minute safely    From Standing, Reach Forward with Outstretched Arm Can reach confidently >25 cm (10")    From Standing Position, Pick up Object from Floor Able to pick up shoe safely and easily    From Standing Position, Turn to Look Behind Over each Shoulder Looks behind one side only/other side shows less weight shift    Turn 360 Degrees Able to turn 360 degrees safely but slowly    Standing Unsupported, Alternately Place Feet on Step/Stool Needs assistance to keep from falling or unable to try    Standing Unsupported, One Foot in Front Able to take small step independently and hold 30 seconds    Standing on One Leg Tries to lift leg/unable to hold 3 seconds but remains standing independently    Total Score 40  [place] Adult PT Treatment/Exercise - 02/20/21 0001       Knee/Hip Exercises: Standing   Functional Squat 2 sets;10 reps    Functional Squat Limitations partial at counter    Other Standing Knee Exercises Side steps, 5 ft, 8x, at counter      Knee/Hip Exercises: Seated   Long Arc Quad Right;Left;10 reps    Long Arc Quad Weight 4 lbs.   L   Ball Squeeze 15x    Clamshell with TheraBand Red   10x   Sit to Sand 10 reps;with UE support   to standing tall                    PT Education - 02/20/21 1747     Education Details reviewed and discussed the results to the Berg balance testing and the 5xSTS    Person(s) Educated Patient    Methods Explanation    Comprehension Verbalized understanding              PT Short Term Goals - 01/23/21 1511       PT SHORT TERM GOAL #1   Title Pt will be Ind in an initial HEP    Status Achieved    Target Date 01/23/21               PT Long Term Goals - 02/20/21 1427       PT LONG TERM GOAL #1    Title Pt will be Ind in a final HEP to maintain achieved LOF    Status Achieved    Target Date 02/20/21      PT LONG TERM GOAL #4   Title Pt will improve 5xSTS score to less than or equal to 21 seconds for decreased fall risk and improved function. 9/221/22: 12.0 sec    Status Achieved    Target Date 02/20/21      PT LONG TERM GOAL #6   Title Increase pt's Berg Balance score to 39/56 as indication of improved balance with mobility. 02/20/21: 40/56    Baseline 34/56    Status Achieved    Target Date 02/20/21                   Plan - 02/20/21 1749     Clinical Impression Statement PT continued to address LE/trunk strength and balance. Berg and 5xSTS were reassessed. Pt has made good progress with both tests reflecting his report of improved confidence with functional and ambulatory activities.. Pt tolerated today's session without adverse effects.    Personal Factors and Comorbidities Age;Time since onset of injury/illness/exacerbation;Comorbidity 1;Comorbidity 2;Comorbidity 3+    Comorbidities L CVA, DM, DVT, PAD, obese    Examination-Activity Limitations Locomotion Level;Squat;Stairs;Stand;Transfers    Examination-Participation Restrictions Other    Stability/Clinical Decision Making Evolving/Moderate complexity    Clinical Decision Making Moderate    Rehab Potential Good    PT Frequency 1x / week    PT Duration 6 weeks    PT Treatment/Interventions ADLs/Self Care Home Management;Neuromuscular re-education;Balance training;Therapeutic exercise;Therapeutic activities;Functional mobility training;Stair training;Gait training;Patient/family education;Manual techniques;Dry needling;Taping;Joint Manipulations    PT Next Visit Plan Reassess remaining LTGs. Anticipate DC the next PT visit    PT Home Exercise Plan [id number]    Consulted and Agree with Plan of Care Patient             Patient will benefit from skilled therapeutic intervention in order to improve the following  deficits and impairments:  Abnormal gait, Difficulty walking, Obesity, Impaired  UE functional use, Decreased activity tolerance, Decreased balance, Decreased strength, Decreased mobility  Visit Diagnosis: Physical deconditioning  Dysequilibrium  Difficulty in walking, not elsewhere classified  Other abnormalities of gait and mobility     Problem List Patient Active Problem List   Diagnosis Date Noted   Pain due to onychomycosis of toenails of both feet 12/19/2020   Physical deconditioning 11/27/2020   Dysequilibrium 11/27/2020   Spastic hemiplegia of right dominant side due to cerebrovascular disease ([place]) 11/03/2017   Muscle wasting 09/25/2017   Depression 07/14/2017   Dysarthria as late effect of stroke 08/18/2016   Stage 2 chronic kidney disease    Leukocytosis    Benign essential HTN    Spastic hemiparesis ([place])    Aphasia due to recent cerebral infarction 07/04/2016   Cerebrovascular accident (CVA) ([place])    Altered mental status 06/30/2016   Fall 06/30/2016   Acute encephalopathy 06/30/2016   Sepsis ([place]) 06/30/2016   Diarrhea 06/30/2016   Acute renal failure superimposed on stage 2 chronic kidney disease ([place]) 06/30/2016   Hemiparesis affecting right side as late effect of stroke ([place]) 10/23/2015   PAD (peripheral artery disease) ([place]) 10/20/2011   Onychomycosis of great toe 09/10/2011   Hyperlipidemia 02/21/2010   CEREBROVASCULAR ACCIDENT, HX OF 12/22/2008   Diabetes mellitus, type II ([place]) 07/30/2006   OBESITY, NOS 07/30/2006   Essential hypertension, benign 07/30/2006   Gar Ponto MS, PT 02/20/21 5:57 PM   [facility] [address] [place], [place], [address] Phone: [phone]   Fax:  [phone]  Name: [name] MRN: [id number] Date of Birth: 15-Mar-1951  PHYSICAL THERAPY DISCHARGE SUMMARY  Visits from Start of Care: 8  Current functional level related to goals / functional outcomes: Good  progress with pt not retruning for final visit   Remaining deficits: Unknown   Education / Equipment: HEP  Patient agrees to discharge. Patient goals were partially met. Patient is being discharged due to not returning since the last visit.  Carlei Huang MS, PT 04/24/21 1:09 PM

## 2021-02-27 ENCOUNTER — Ambulatory Visit: Payer: Medicare HMO

## 2021-03-07 ENCOUNTER — Ambulatory Visit: Payer: Medicare HMO | Admitting: Family Medicine

## 2021-03-07 ENCOUNTER — Other Ambulatory Visit: Payer: Self-pay | Admitting: Family Medicine

## 2021-03-25 ENCOUNTER — Encounter: Payer: Self-pay | Admitting: Family Medicine

## 2021-03-25 ENCOUNTER — Ambulatory Visit (INDEPENDENT_AMBULATORY_CARE_PROVIDER_SITE_OTHER): Payer: Medicare HMO | Admitting: Family Medicine

## 2021-03-25 ENCOUNTER — Ambulatory Visit (INDEPENDENT_AMBULATORY_CARE_PROVIDER_SITE_OTHER): Payer: Medicare HMO

## 2021-03-25 ENCOUNTER — Other Ambulatory Visit: Payer: Self-pay

## 2021-03-25 VITALS — BP 140/82 | HR 59 | Wt 201.0 lb

## 2021-03-25 DIAGNOSIS — Z23 Encounter for immunization: Secondary | ICD-10-CM

## 2021-03-25 DIAGNOSIS — E1159 Type 2 diabetes mellitus with other circulatory complications: Secondary | ICD-10-CM

## 2021-03-25 DIAGNOSIS — I10 Essential (primary) hypertension: Secondary | ICD-10-CM | POA: Diagnosis not present

## 2021-03-25 DIAGNOSIS — Z794 Long term (current) use of insulin: Secondary | ICD-10-CM | POA: Diagnosis not present

## 2021-03-25 LAB — POCT GLYCOSYLATED HEMOGLOBIN (HGB A1C): HbA1c, POC (controlled diabetic range): 7.2 % — AB (ref 0.0–7.0)

## 2021-03-25 NOTE — Patient Instructions (Addendum)
It was great seeing you today!  Today we discussed your blood pressure. It was 140/82, which is great since it is at the goal that we would want. Please continue to take all your blood pressure medications as prescribed.   Your A1c is 7.2, we would want it to be less than 7. Please continue to take your metformin as prescribed. Please try to eat a balanced diet daily with plenty of vegetables as we discussed. Try to drink a soda every 2-3 days instead of daily and make sure to drink at least 6-8 cups of water daily.   Please make an appointment with the ophthalmologist (eye doctor) at your earliest convenience to be seen. As we discussed, it is important to have eye exam check ups at least once a year.  You also received your COVID booster and flu shots today, it is normal if you do not feel your best within the next 24-48 hours.  Please follow up at your next scheduled appointment in 6 months, if anything arises between now and then, please don't hesitate to contact our office.   Thank you for allowing [place] to be a part of your medical care!  Thank you, Dr. Larae Grooms

## 2021-03-25 NOTE — Assessment & Plan Note (Signed)
-  A1c 7.2, stable, close to goal of <7. -encouraged to try to have bottle of soda every 2-3 days instead of daily, diet and exercise counseling provided, handout provided -instructed to make appointment with ophthalmologist at his earliest convenience, patient agreed -follow up in 6 months or sooner as appropriate

## 2021-03-25 NOTE — Assessment & Plan Note (Signed)
-  BP 140/82, at goal given also has history of DM -continue anti-hypertensive regimen, no changes, instructed to continue amlodipine -diet and exercise counseling provided

## 2021-03-25 NOTE — Progress Notes (Signed)
    SUBJECTIVE:   Hypertension Patient presents for BP check. Recently seen by a provider when amlodipine was restarted after having elevated BP in the office. Current regimen includes amlodipine coreg, lisinopril and chlorthalidone. Endorses daily compliance, tolerating medications well without complications. Denies chest pain, dyspnea and leg swelling. He is not checking his BP at home because he recently bought a BP cuff but dropped it and it broke. He is in the process of getting a new one.   Type 2 DM Compliant on metformin 1000 bid. Reports that glucose levels range between 120-150s at home, sometimes lower but denies any hypoglycemic episodes. Las seen by ophthalmologist last year. Endorses eating a balanced diet but has 1 bottle of soda a day, he is more than open to cutting down. Denies neuropathy.   OBJECTIVE:   BP 140/82   Pulse (!) 59   Wt 201 lb (91.2 kg)   SpO2 99%   BMI 31.48 kg/m   General: Patient well-appearing, in no acute distress.  CV: RRR, no murmurs or gallops auscultated Resp: CTAB Abdomen: soft, nontender, presence of bowel sounds Ext: radial pulses strong and equal bilaterally, no LE edema noted Neuro: ambulates with assistance of walker Psych: mood appropriate, pleasant  ASSESSMENT/PLAN:   Benign essential HTN -BP 140/82, at goal given also has history of DM -continue anti-hypertensive regimen, no changes, instructed to continue amlodipine -diet and exercise counseling provided   Diabetes mellitus, type II (HCC) -A1c 7.2, stable, close to goal of <7. -encouraged to try to have bottle of soda every 2-3 days instead of daily, diet and exercise counseling provided, handout provided -instructed to make appointment with ophthalmologist at his earliest convenience, patient agreed -follow up in 6 months or sooner as appropriate      Rogene Meth Larae Grooms, [place]

## 2021-03-27 ENCOUNTER — Ambulatory Visit: Payer: Medicare HMO | Admitting: Podiatry

## 2021-04-08 ENCOUNTER — Other Ambulatory Visit: Payer: Self-pay | Admitting: Family Medicine

## 2021-04-08 DIAGNOSIS — F32A Depression, unspecified: Secondary | ICD-10-CM

## 2021-04-23 ENCOUNTER — Ambulatory Visit: Payer: Medicare HMO | Admitting: Podiatry

## 2021-05-27 ENCOUNTER — Other Ambulatory Visit: Payer: Self-pay | Admitting: Family Medicine

## 2021-05-27 DIAGNOSIS — I10 Essential (primary) hypertension: Secondary | ICD-10-CM

## 2021-07-08 ENCOUNTER — Other Ambulatory Visit: Payer: Self-pay

## 2021-07-08 MED ORDER — LISINOPRIL 40 MG PO TABS: 40.0000 mg | ORAL_TABLET | Freq: Every day | ORAL | 3 refills | Status: DC

## 2021-07-08 MED ORDER — CLOPIDOGREL BISULFATE 75 MG PO TABS: 75.0000 mg | ORAL_TABLET | Freq: Every day | ORAL | 2 refills | Status: DC

## 2021-08-15 ENCOUNTER — Telehealth: Payer: Self-pay | Admitting: Family Medicine

## 2021-08-15 NOTE — Telephone Encounter (Signed)
Called patient to schedule AWV, LVM for patient to call back, please assist in scheduling if patient calls back. Thanks! ?

## 2021-09-03 ENCOUNTER — Other Ambulatory Visit: Payer: Self-pay | Admitting: Family Medicine

## 2021-09-03 DIAGNOSIS — I10 Essential (primary) hypertension: Secondary | ICD-10-CM

## 2021-10-04 ENCOUNTER — Ambulatory Visit: Payer: Medicare HMO | Admitting: Podiatry

## 2021-10-10 DIAGNOSIS — H524 Presbyopia: Secondary | ICD-10-CM | POA: Diagnosis not present

## 2021-11-01 ENCOUNTER — Ambulatory Visit: Payer: Medicare HMO | Admitting: Podiatry

## 2021-11-06 ENCOUNTER — Encounter: Payer: Self-pay | Admitting: Podiatry

## 2021-11-06 ENCOUNTER — Ambulatory Visit (INDEPENDENT_AMBULATORY_CARE_PROVIDER_SITE_OTHER): Payer: Medicare HMO | Admitting: Podiatry

## 2021-11-06 DIAGNOSIS — M79675 Pain in left toe(s): Secondary | ICD-10-CM | POA: Diagnosis not present

## 2021-11-06 DIAGNOSIS — B351 Tinea unguium: Secondary | ICD-10-CM

## 2021-11-06 DIAGNOSIS — M79674 Pain in right toe(s): Secondary | ICD-10-CM | POA: Diagnosis not present

## 2021-11-06 DIAGNOSIS — Z794 Long term (current) use of insulin: Secondary | ICD-10-CM

## 2021-11-06 DIAGNOSIS — E1159 Type 2 diabetes mellitus with other circulatory complications: Secondary | ICD-10-CM

## 2021-11-06 DIAGNOSIS — N182 Chronic kidney disease, stage 2 (mild): Secondary | ICD-10-CM

## 2021-11-06 DIAGNOSIS — I739 Peripheral vascular disease, unspecified: Secondary | ICD-10-CM

## 2021-11-06 NOTE — Progress Notes (Signed)
This patient returns to my office for at risk foot care.  This patient requires this care by a professional since this patient will be at risk due to having CKD, PAD, CVA and diabetes.    This patient is unable to cut nails himself since the patient cannot reach his nails.These nails are painful walking and wearing shoes.  This patient presents for at risk foot care today.  General Appearance  Alert, conversant and in no acute stress.  Vascular  Dorsalis pedis and posterior tibial  pulses are  weakly palpable  bilaterally.  Capillary return is within normal limits  bilaterally. Temperature is within normal limits  bilaterally.  Neurologic  Senn-Weinstein monofilament wire test within normal limits  bilaterally. Muscle power within normal limits bilaterally.  Nails Thick disfigured discolored nails with subungual debris  from hallux to fifth toes bilaterally. No evidence of bacterial infection or drainage bilaterally.  Orthopedic  No limitations of motion  feet .  No crepitus or effusions noted.  No bony pathology or digital deformities noted. Hallux limitus 1st MPJ  B/L.  Skin  normotropic skin with no porokeratosis noted bilaterally.  No signs of infections or ulcers noted.     Onychomycosis  Pain in right toes  Pain in left toes  Consent was obtained for treatment procedures.   Mechanical debridement of nails 1-5  bilaterally performed with a nail nipper.  Filed with dremel without incident.    Return office visit   3 months                  Told patient to return for periodic foot care and evaluation due to potential at risk complications.   Gardiner Barefoot DPM

## 2021-11-15 ENCOUNTER — Encounter (HOSPITAL_COMMUNITY): Payer: Self-pay

## 2021-11-15 ENCOUNTER — Emergency Department (HOSPITAL_COMMUNITY): Payer: Medicare HMO

## 2021-11-15 ENCOUNTER — Inpatient Hospital Stay (HOSPITAL_COMMUNITY)
Admission: EM | Admit: 2021-11-15 | Discharge: 2021-11-25 | DRG: 683 | Disposition: A | Discharge status: Skilled Nursing Facility | Payer: Medicare HMO | Attending: Family Medicine | Admitting: Family Medicine

## 2021-11-15 ENCOUNTER — Other Ambulatory Visit: Payer: Self-pay

## 2021-11-15 DIAGNOSIS — Z602 Problems related to living alone: Secondary | ICD-10-CM | POA: Diagnosis present

## 2021-11-15 DIAGNOSIS — R32 Unspecified urinary incontinence: Secondary | ICD-10-CM | POA: Diagnosis present

## 2021-11-15 DIAGNOSIS — Z751 Person awaiting admission to adequate facility elsewhere: Secondary | ICD-10-CM

## 2021-11-15 DIAGNOSIS — N182 Chronic kidney disease, stage 2 (mild): Secondary | ICD-10-CM | POA: Diagnosis not present

## 2021-11-15 DIAGNOSIS — N179 Acute kidney failure, unspecified: Principal | ICD-10-CM | POA: Diagnosis present

## 2021-11-15 DIAGNOSIS — Z91013 Allergy to seafood: Secondary | ICD-10-CM

## 2021-11-15 DIAGNOSIS — E119 Type 2 diabetes mellitus without complications: Secondary | ICD-10-CM

## 2021-11-15 DIAGNOSIS — I959 Hypotension, unspecified: Secondary | ICD-10-CM | POA: Diagnosis not present

## 2021-11-15 DIAGNOSIS — Z79899 Other long term (current) drug therapy: Secondary | ICD-10-CM

## 2021-11-15 DIAGNOSIS — N1330 Unspecified hydronephrosis: Secondary | ICD-10-CM

## 2021-11-15 DIAGNOSIS — F32A Depression, unspecified: Secondary | ICD-10-CM | POA: Diagnosis present

## 2021-11-15 DIAGNOSIS — E871 Hypo-osmolality and hyponatremia: Secondary | ICD-10-CM | POA: Diagnosis present

## 2021-11-15 DIAGNOSIS — R1310 Dysphagia, unspecified: Secondary | ICD-10-CM | POA: Diagnosis present

## 2021-11-15 DIAGNOSIS — Z743 Need for continuous supervision: Secondary | ICD-10-CM | POA: Diagnosis not present

## 2021-11-15 DIAGNOSIS — M25511 Pain in right shoulder: Secondary | ICD-10-CM | POA: Diagnosis not present

## 2021-11-15 DIAGNOSIS — E785 Hyperlipidemia, unspecified: Secondary | ICD-10-CM | POA: Diagnosis present

## 2021-11-15 DIAGNOSIS — Z794 Long term (current) use of insulin: Secondary | ICD-10-CM | POA: Diagnosis not present

## 2021-11-15 DIAGNOSIS — W19XXXA Unspecified fall, initial encounter: Secondary | ICD-10-CM | POA: Diagnosis present

## 2021-11-15 DIAGNOSIS — I69322 Dysarthria following cerebral infarction: Secondary | ICD-10-CM

## 2021-11-15 DIAGNOSIS — R531 Weakness: Secondary | ICD-10-CM | POA: Diagnosis not present

## 2021-11-15 DIAGNOSIS — Z833 Family history of diabetes mellitus: Secondary | ICD-10-CM

## 2021-11-15 DIAGNOSIS — N139 Obstructive and reflux uropathy, unspecified: Secondary | ICD-10-CM

## 2021-11-15 DIAGNOSIS — I69392 Facial weakness following cerebral infarction: Secondary | ICD-10-CM

## 2021-11-15 DIAGNOSIS — A419 Sepsis, unspecified organism: Secondary | ICD-10-CM | POA: Diagnosis not present

## 2021-11-15 DIAGNOSIS — I69391 Dysphagia following cerebral infarction: Secondary | ICD-10-CM

## 2021-11-15 DIAGNOSIS — N400 Enlarged prostate without lower urinary tract symptoms: Secondary | ICD-10-CM | POA: Diagnosis present

## 2021-11-15 DIAGNOSIS — E872 Acidosis, unspecified: Secondary | ICD-10-CM | POA: Diagnosis present

## 2021-11-15 DIAGNOSIS — I5032 Chronic diastolic (congestive) heart failure: Secondary | ICD-10-CM | POA: Diagnosis present

## 2021-11-15 DIAGNOSIS — J189 Pneumonia, unspecified organism: Secondary | ICD-10-CM

## 2021-11-15 DIAGNOSIS — R404 Transient alteration of awareness: Secondary | ICD-10-CM | POA: Diagnosis not present

## 2021-11-15 DIAGNOSIS — I69351 Hemiplegia and hemiparesis following cerebral infarction affecting right dominant side: Secondary | ICD-10-CM

## 2021-11-15 DIAGNOSIS — Z823 Family history of stroke: Secondary | ICD-10-CM

## 2021-11-15 DIAGNOSIS — J90 Pleural effusion, not elsewhere classified: Secondary | ICD-10-CM

## 2021-11-15 DIAGNOSIS — E875 Hyperkalemia: Secondary | ICD-10-CM | POA: Diagnosis not present

## 2021-11-15 DIAGNOSIS — I130 Hypertensive heart and chronic kidney disease with heart failure and stage 1 through stage 4 chronic kidney disease, or unspecified chronic kidney disease: Secondary | ICD-10-CM | POA: Diagnosis present

## 2021-11-15 DIAGNOSIS — E8729 Other acidosis: Secondary | ICD-10-CM | POA: Insufficient documentation

## 2021-11-15 DIAGNOSIS — R079 Chest pain, unspecified: Secondary | ICD-10-CM | POA: Diagnosis not present

## 2021-11-15 DIAGNOSIS — Z91014 Allergy to mammalian meats: Secondary | ICD-10-CM

## 2021-11-15 DIAGNOSIS — E1159 Type 2 diabetes mellitus with other circulatory complications: Secondary | ICD-10-CM | POA: Diagnosis not present

## 2021-11-15 DIAGNOSIS — Z7984 Long term (current) use of oral hypoglycemic drugs: Secondary | ICD-10-CM

## 2021-11-15 DIAGNOSIS — E1122 Type 2 diabetes mellitus with diabetic chronic kidney disease: Secondary | ICD-10-CM | POA: Diagnosis present

## 2021-11-15 DIAGNOSIS — I10 Essential (primary) hypertension: Secondary | ICD-10-CM | POA: Diagnosis not present

## 2021-11-15 DIAGNOSIS — R4182 Altered mental status, unspecified: Secondary | ICD-10-CM | POA: Diagnosis not present

## 2021-11-15 DIAGNOSIS — E1151 Type 2 diabetes mellitus with diabetic peripheral angiopathy without gangrene: Secondary | ICD-10-CM | POA: Diagnosis present

## 2021-11-15 DIAGNOSIS — R9431 Abnormal electrocardiogram [ECG] [EKG]: Secondary | ICD-10-CM | POA: Diagnosis not present

## 2021-11-15 DIAGNOSIS — Z8249 Family history of ischemic heart disease and other diseases of the circulatory system: Secondary | ICD-10-CM

## 2021-11-15 DIAGNOSIS — Z86718 Personal history of other venous thrombosis and embolism: Secondary | ICD-10-CM

## 2021-11-15 LAB — CBC
HCT: 35.1 % — ABNORMAL LOW (ref 39.0–52.0)
Hemoglobin: 11.2 g/dL — ABNORMAL LOW (ref 13.0–17.0)
MCH: 29.6 pg (ref 26.0–34.0)
MCHC: 31.9 g/dL (ref 30.0–36.0)
MCV: 92.9 fL (ref 80.0–100.0)
Platelets: 152 10*3/uL (ref 150–400)
RBC: 3.78 MIL/uL — ABNORMAL LOW (ref 4.22–5.81)
RDW: 13.8 % (ref 11.5–15.5)
WBC: 15.3 10*3/uL — ABNORMAL HIGH (ref 4.0–10.5)
nRBC: 0 % (ref 0.0–0.2)

## 2021-11-15 LAB — URINALYSIS, ROUTINE W REFLEX MICROSCOPIC
Bilirubin Urine: NEGATIVE
Glucose, UA: 50 mg/dL — AB
Ketones, ur: NEGATIVE mg/dL
Leukocytes,Ua: NEGATIVE
Nitrite: NEGATIVE
Protein, ur: NEGATIVE mg/dL
Specific Gravity, Urine: 1.012 (ref 1.005–1.030)
pH: 5 (ref 5.0–8.0)

## 2021-11-15 LAB — HIV ANTIBODY (ROUTINE TESTING W REFLEX): HIV Screen 4th Generation wRfx: NONREACTIVE

## 2021-11-15 LAB — ACETAMINOPHEN LEVEL: Acetaminophen (Tylenol), Serum: <10 ug/mL — ABNORMAL LOW (ref 10–30)

## 2021-11-15 LAB — SALICYLATE LEVEL: Salicylate Lvl: <7 mg/dL — ABNORMAL LOW (ref 7.0–30.0)

## 2021-11-15 LAB — COMPREHENSIVE METABOLIC PANEL
ALT: 14 U/L (ref 0–44)
AST: 17 U/L (ref 15–41)
Albumin: 2.8 g/dL — ABNORMAL LOW (ref 3.5–5.0)
Alkaline Phosphatase: 69 U/L (ref 38–126)
Anion gap: 18 — ABNORMAL HIGH (ref 5–15)
BUN: 84 mg/dL — ABNORMAL HIGH (ref 8–23)
CO2: 15 mmol/L — ABNORMAL LOW (ref 22–32)
Calcium: 8.1 mg/dL — ABNORMAL LOW (ref 8.9–10.3)
Chloride: 101 mmol/L (ref 98–111)
Creatinine, Ser: 6.64 mg/dL — ABNORMAL HIGH (ref 0.61–1.24)
GFR, Estimated: 8 mL/min — ABNORMAL LOW (ref >60)
Glucose, Bld: 198 mg/dL — ABNORMAL HIGH (ref 70–99)
Potassium: 5.9 mmol/L — ABNORMAL HIGH (ref 3.5–5.1)
Sodium: 134 mmol/L — ABNORMAL LOW (ref 135–145)
Total Bilirubin: 0.8 mg/dL (ref 0.3–1.2)
Total Protein: 5.7 g/dL — ABNORMAL LOW (ref 6.5–8.1)

## 2021-11-15 LAB — BRAIN NATRIURETIC PEPTIDE: B Natriuretic Peptide: 157.1 pg/mL — ABNORMAL HIGH (ref 0.0–100.0)

## 2021-11-15 LAB — BASIC METABOLIC PANEL
Anion gap: 15 (ref 5–15)
BUN: 83 mg/dL — ABNORMAL HIGH (ref 8–23)
CO2: 15 mmol/L — ABNORMAL LOW (ref 22–32)
Calcium: 7.8 mg/dL — ABNORMAL LOW (ref 8.9–10.3)
Chloride: 103 mmol/L (ref 98–111)
Creatinine, Ser: 5.6 mg/dL — ABNORMAL HIGH (ref 0.61–1.24)
GFR, Estimated: 10 mL/min — ABNORMAL LOW (ref >60)
Glucose, Bld: 189 mg/dL — ABNORMAL HIGH (ref 70–99)
Potassium: 6.2 mmol/L — ABNORMAL HIGH (ref 3.5–5.1)
Sodium: 133 mmol/L — ABNORMAL LOW (ref 135–145)

## 2021-11-15 LAB — PROTIME-INR
INR: 1.2 (ref 0.8–1.2)
Prothrombin Time: 14.9 seconds (ref 11.4–15.2)

## 2021-11-15 LAB — ETHANOL: Alcohol, Ethyl (B): <10 mg/dL (ref <10)

## 2021-11-15 LAB — HEMOGLOBIN A1C
Hgb A1c MFr Bld: 8.1 % — ABNORMAL HIGH (ref 4.8–5.6)
Mean Plasma Glucose: 185.77 mg/dL

## 2021-11-15 LAB — RAPID URINE DRUG SCREEN, HOSP PERFORMED
Amphetamines: NOT DETECTED
Barbiturates: NOT DETECTED
Benzodiazepines: NOT DETECTED
Cocaine: NOT DETECTED
Opiates: NOT DETECTED
Tetrahydrocannabinol: NOT DETECTED

## 2021-11-15 LAB — CBG MONITORING, ED: Glucose-Capillary: 185 mg/dL — ABNORMAL HIGH (ref 70–99)

## 2021-11-15 LAB — LACTIC ACID, PLASMA
Lactic Acid, Venous: 2.5 mmol/L (ref 0.5–1.9)
Lactic Acid, Venous: 3 mmol/L (ref 0.5–1.9)
Lactic Acid, Venous: 2.8 mmol/L (ref 0.5–1.9)

## 2021-11-15 LAB — TSH: TSH: 0.882 u[IU]/mL (ref 0.350–4.500)

## 2021-11-15 LAB — AMMONIA: Ammonia: 28 umol/L (ref 9–35)

## 2021-11-15 LAB — CK: Total CK: 167 U/L (ref 49–397)

## 2021-11-15 MED ORDER — LACTATED RINGERS IV SOLN: INTRAVENOUS | Status: AC

## 2021-11-15 MED ORDER — SODIUM ZIRCONIUM CYCLOSILICATE 10 G PO PACK
10.0000 g | PACK | Freq: Once | ORAL | Status: AC
  Administered 2021-11-15: 10 g via ORAL
  Filled 2021-11-15: qty 1

## 2021-11-15 MED ORDER — CALCIUM GLUCONATE 10 % IV SOLN
1.0000 g | Freq: Once | INTRAVENOUS | Status: AC
  Administered 2021-11-15: 1 g via INTRAVENOUS
  Filled 2021-11-15: qty 10

## 2021-11-15 MED ORDER — SODIUM CHLORIDE 0.9 % IV SOLN
500.0000 mg | INTRAVENOUS | Status: DC
  Administered 2021-11-15 – 2021-11-16 (×2): 500 mg via INTRAVENOUS
  Filled 2021-11-15 (×2): qty 5

## 2021-11-15 MED ORDER — LACTATED RINGERS IV BOLUS
2000.0000 mL | Freq: Once | INTRAVENOUS | Status: AC
  Administered 2021-11-15: 2000 mL via INTRAVENOUS

## 2021-11-15 MED ORDER — SODIUM CHLORIDE 0.9 % IV SOLN
1.0000 g | INTRAVENOUS | Status: DC
  Administered 2021-11-15 – 2021-11-16 (×2): 1 g via INTRAVENOUS
  Filled 2021-11-15 (×2): qty 10

## 2021-11-15 NOTE — Assessment & Plan Note (Addendum)
Creatinine stable. Patient is medically stable for discharge to SNF and pending placement. -monitor daily BMP  -Strict I/Os -avoid nephrotoxic agents

## 2021-11-15 NOTE — H&P (Addendum)
Hospital Admission History and Physical Service Pager: [phone]  Patient name: [name] Medical record number: [id number] Date of Birth: 05-08-1951 Age: 71 y.o. Gender: male  Primary Care Provider: Donney Dice, DO Consultants: None Code Status: Full  Preferred Emergency Contact:  Contact Information     Name Relation Home Work Mobile   Guthmiller,Sophona "[place]" Daughter [phone]  [phone]   Shakai, Dolley Other [place] is ex-wife, who checks on patient regularly; called EMS.  Chief Complaint: Generalized weakness  Assessment and Plan: [name] is a 71 y.o. male presenting with altered mental status with GCS 9 improved to 14 in the ED and generalized weakness, found to be septic and in acute renal failure.   * Sepsis due to undetermined organism, with acute renal failure [facility]) Patient with tachypnea, WBC 15.3, and lactic acidosis to 2.5, meeting SIRS criteria for sepsis.  Lactic acid worsened to 3.0 prior to starting fluids.  Vital signs otherwise stable, as patient afebrile, without tachycardia, and no hypotension.  No identifiable cause at this time.  Acute renal failure with creatinine 6.67, no CK elevation, so rhabdomyolysis less likely.  UA largely WNL and UDS negative.  CXR with opacity in right lung base for which pneumonia cannot be excluded, however patient without respiratory symptomology; may be aspiration pneumonia. -Admit to FPTS, Dr. Gwendlyn Deutscher attending -Vitals per floor protocol -Continue fluid resuscitation with LR 125 mL/h --Empiric treatment for aspiration pneumonia with IV Rocephin 1 g and Zithromax 500 mg -- NPO pending SLP evaluation  -Blood cultures drawn and pending -Monitor BMP -Monitor CBC  Acute renal failure superimposed on stage 2 chronic kidney disease (HCC) Creatinine 6.64, BUN 84; baseline around 0.95-1.15.  K+ 5.9.  10 g Lokelma given in the ED, Foley catheter inserted to relieve bladder, and fluid  resuscitation initiated. - Continue Foley -Avoid nephrotoxic agents -Strict I's and O's -Daily weights - Daily BMP monitoring  Obstructive uropathy Renal ultrasound with mild bilateral hydronephrosis, most likely due to bladder distention and prostatomegaly.  Bladder scan with 788 mL retained; Foley catheter inserted. Likely post-obstructive uropathy vs. retention - Consider Flomax when patient ready for void trial  Metabolic acidosis, increased anion gap Anion gap 18, CO2 15, lactic acid 2.5.  BUN 84 (baseline around 20-30).  Likely due to uremia, but need to rule out ethanol, salicylate toxicity. - Daily BMP - Salicylate and ethanol levels pending   Pleural effusion Right sided pleural effusion noted on chest x-ray, blunting the costophrenic angle.  Patient with 3+ pitting edema of bilateral lower extremities to the knees and right upper extremity in the hand and forearm.  As well, right-sided basilar crackles auscultated on physical exam.  Last echo 2018 with LVEF 55 to 60%, no regional wall motion abnormalities, G2 DD.  Need to rule out CHF versus aspiration pneumonia. - Repeat TTE -Close monitoring of fluid status  Essential hypertension Home medications: Amlodipine 5 mg, Coreg 25 mg twice daily, lisinopril 40 mg, chlorthalidone 25 mg. - We will hold home antihypertensives pending echocardiogram, as patient previously with grade 2 diastolic dysfunction in 7253.  Need to assess for CHF exacerbation and possibly adjust medications accordingly.    Chronic conditions: Depression-Zoloft 100 mg History of CVA  HLD-atorvastatin 40 mg, Plavix 75 mg   FEN/GI: N.p.o. pending SLP evaluation VTE Prophylaxis: SQ heparin  Disposition: Admit to progressive  History of Present Illness:  [name] is a 71 y.o. male presenting for  workup after being found with altered mental status, urinary and fecal incontinence, and emesis. He reports that about 4 days ago, he began to feel  generally weak with progressively worsening difficulty swallowing and appetite.  The difficulty swallowing has been with both liquids and solids; as well he reports sometimes choking and vomiting undigested food.  His weakness progressed to the point where he was unable to get out of bed to use the restroom, so opted to use the restroom on himself.  He lives at home alone, but this morning, family found him in feces, urine, and vomit.  Patient endorses a history of stroke with residual right-sided weakness.  He reports no recent medication changes, illnesses, nor sick contacts.  Denies illicit substance use.  He does report drinking 1 energy drink per day that decreases his appetite.   In the ED, imaging obtained revealing possible pneumonia, and bilateral hydronephrosis, which can be contributing to symptoms.  Fluids were started, LR 125 mL/h, and blood cultures drawn.  Lactic acid 2.5 on admission, and worsened to 3.0 before starting fluids.  Given one-time Lokelma 10 g and calcium gluconate.  Review Of Systems: Per HPI with the following additions: Poor PO intake, Dec appetite. Denies nausea, chest pain, dyspnea, cough, headache, back or leg pain, and dental issues including pain in teeth or lesions/soreness of mouth.   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   Pertinent Past Surgical History: None  Remainder reviewed in history tab.  Pertinent Social History: Tobacco use: Denies Alcohol use: Denies, never Other Substance use: Denies, never Lives alone. Family checks in. Ambulates with cane and walker. Cooks for self. Retired.  Pertinent Family History: Mother-T2DM, MI, HTN Sister-HTN, T2DM, CVA Brother-HTN Daughter- HTN Son- HTN  Remainder reviewed in history tab.   Important Outpatient Medications: Amlodipine 5 mg nightly Atorvastatin 40 mg Coreg 25 mg twice daily Plavix 75  mg Lisinopril 40 mg Metformin 1000 mg twice daily Zoloft 100 mg  Remainder reviewed in medication history.   Objective: BP (!) 148/57   Pulse 71   Temp 98.5 F (36.9 C) (Rectal)   Resp (!) 25   SpO2 98%  Exam: General: Elderly male lying in bed, with urine odor, eyes closed, in NAD. Eyes: No scleral icterus nor conjunctival injection. ENTM: mouth clear and moist.  Poor dentition. Neck: Supple, +JVD Cardiovascular: RRR, II/VI systolic murmur Respiratory: Right-sided basilar inspiratory crackles.  Left lung base CTA.  Normal WOB on room air Gastrointestinal: Soft, nontender, mild distention of suprapubic area Derm: No bruises, rashes, or lesions noted.  Warm and dry. Extremities: 3+ pitting edema of bilateral lower extremities R>L, 1+ nonpitting right upper extremity (hand to forearm) Neuro: 4/5 strength LUE, 1/5 RUE, 3/5 LLE, 2/5 RLE. Residual right-sided facial droop when speaking, and mild dysarthria. Psych: tired appearing but otherwise normal affect  Labs:  CBC BMET  Recent Labs  Lab 11/15/21 1224  WBC 15.3*  HGB 11.2*  HCT 35.1*  PLT 152   Recent Labs  Lab 11/15/21 1224  NA 134*  K 5.9*  CL 101  CO2 15*  BUN 84*  CREATININE 6.64*  GLUCOSE 198*  CALCIUM 8.1*    Pertinent additional labs  Lactic acid, plasma Order: [id number] Status: Final result    Visible to patient: No (scheduled for 11/15/2021  4:52 PM)    Next appt: 02/10/2022 at 03:00 PM in Podiatry [facility], DPM)  0 Result Notes         Component Ref Range & Units 14:29 (11/15/21) 12:24 (11/15/21) 5 yr ago (06/30/16) 5 yr ago (06/30/16) 5 yr ago (06/29/16)  Lactic Acid, Venous 0.5 - 1.9 mmol/L 3.0 High Panic   2.5 High Panic  CM  0.8  1.0  2.18 High Panic      .  EKG: NSR; no QTc prolongation   Imaging Studies Performed:  Chest x-ray IMPRESSION: 1. Indistinct airspace opacity at the right lung base, cannot exclude pneumonia. There is also some mild blunting of the right posterior  costophrenic angle potentially representing a small right pleural effusion. 2.  Aortic Atherosclerosis (ICD10-I70.0).   Electronically Signed   By: Van Clines M.D.   On: 11/15/2021 13:00   My Interpretation: RLL opacity, and mild blunting of the costophrenic angle.   CT Head No acute intracranial abnormalities. Small vessel disease, and significant dental disease.   Renal U/S IMPRESSION: Mild bilateral hydronephrosis, possibly related to outlet obstruction given associated bladder distention and prostatomegaly.     Electronically Signed   By: Yetta Glassman M.D.   On: 11/15/2021 14:29   Rosezetta Schlatter, MD 11/15/2021, 6:55 PM PGY-1, [place] Intern pager: [phone], text pages welcome Secure chat group [facility] Upper-Level Resident Addendum   I have independently interviewed and examined the patient. I have discussed the above with Dr. Earley Favor and agree with the documented plan. My edits for correction/addition/clarification are included above. Please see any attending notes.   Alcus Dad, MD PGY-2, [facility] 11/15/2021 7:40 PM  [place] Service pager: [phone] (text pages welcome through [place])

## 2021-11-15 NOTE — Progress Notes (Signed)
Dr. Marcina Millard text paged to review all tonight's lab work and also notified L.R. now started and Antibiotics just getting started. Will come see patient sometime tonight

## 2021-11-15 NOTE — Progress Notes (Signed)
FPTS Brief Progress Note  S:Received page that patient K was 6.2. Went bedside to see patient with Dr. Larae Grooms. Patient laying in bed comfortable, no complaints, tired appearing. Explained to patient that despite issues with swallowing that he would need to take lokelma medicine PO, as his K was too high. Patient understood and agreed.   O: BP (!) 155/53 (BP Location: Left Arm)   Pulse 67   Temp 98.3 F (36.8 C) (Oral)   Resp 20   Ht '5\' 7"'$  (1.702 m)   Wt 98.2 kg   SpO2 98%   BMI 33.91 kg/m   General: Ill appearing, tired, NAD, African American male Card: RRR, NRMG, JVD Resp: CTABL, good work of breathing Abd: Soft, non-distended, NTTP Extremities: 3+ pitting edema to level of shins bilaterally.   A/P: Hyperkalemia Patient K 5.9 in ED s/p lokelma 10g, with f/u K 6.2. Patient is HDS, with no complaints of chest pain. Will treat patient with 2nd dose of lokelma. As patient took it PO last time, will repeat this time, despite him being NPO for issues with swallowing.  -Lokelma 10 g x 1 -AM BMP  Sepsis Patient HDS, and afebrile with O2 sats > 92%  and good work of breathing. He is volume up on exam with JVD and pitting edema in LE. Will continue to monitor while on fluids, and reassess need in daytime.  - Plans per day team - Orders reviewed. Labs for AM ordered, which was adjusted as needed.    Holley Bouche, MD 11/15/2021, 10:09 PM PGY-1, [facility] Night Resident  Please page [phone] with questions.

## 2021-11-15 NOTE — Assessment & Plan Note (Addendum)
Elevated PSA in conjunction with prostatomegaly. Stable. -Foley in place, continue -Continue Flomax 0.8mg  daily -Outpatient urology follow up scheduled

## 2021-11-15 NOTE — ED Provider Triage Note (Signed)
Emergency Medicine Provider Triage Evaluation Note  [name] , a 71 y.o. male  was evaluated in triage.  Pt complains of altered mental status. Lives alone, ex wife has been calling to check in on him and reported more altered all week. EMS reports they couldn'Chapman find any medications in his home and he was covered in feces. Vomited on himself during transport.   Review of Systems  Unable to answer any questions  Physical Exam  BP (!) 121/54   Pulse 65   Temp 99.1 F (37.3 C) (Oral)   Resp 18   SpO2 100%  Gen:   Awake, no distress   Resp:  Normal effort  MSK:   Moves extremities without difficulty  Other:  GCS 9  Medical Decision Making  Medically screening exam initiated at 11:56 AM.  Appropriate orders placed.  [name] was informed that the remainder of the evaluation will be completed by another provider, this initial triage assessment does not replace that evaluation, and the importance of remaining in the ED until their evaluation is complete.  Covered in vomit and feces   Kyle Murray T, PA-C 11/15/21 1156

## 2021-11-15 NOTE — ED Notes (Signed)
ED TO INPATIENT HANDOFF REPORT  S Name/Age/Gender [name] 71 y.o. male Room/Bed: 025C/025C  Code Status   Code Status: Full Code  Home/SNF/Other He is from home alone but unsure if he will be going home alone  Patient oriented to: self, place, time, and situation Is this baseline? Yes   Triage Complete: Triage complete  Chief Complaint Acute renal failure (ARF) ([place]) [N17.9]  Triage Note Patient with GCS 9, no movement on right side, smells of urine and covered in feces and vomit.  Ex wife has been checking on patient and he has been more and more confused. Lives at home alone.    Allergies No Known Allergies  Level of Care/Admitting Diagnosis ED Disposition     ED Disposition  Admit   Condition  --   Comment  Hospital Area: [place] [[id number]]  Level of Care: Progressive [102]  Admit to Progressive based on following criteria: NEPHROLOGY stable condition requiring close monitoring for AKI, requiring Hemodialysis or Peritoneal Dialysis either from expected electrolyte imbalance, acidosis, or fluid overload that can be managed by NIPPV or high flow oxygen.  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   Diabetes mellitus    DVT (deep venous thrombosis) (H[facility])    Hypertension    PAD (peripheral artery disease) ([place])    Stroke ([place])    2008   Past Surgical History:  Procedure Laterality Date   COLONOSCOPY      None       A IV Location/Drains/Wounds Patient Lines/Drains/Airways Status     Active Line/Drains/Airways     Name Placement date Placement time Site Days   Peripheral IV 11/15/21 22 G Left;Posterior Hand 11/15/21  1224  Hand  less than 1   Urethral Catheter Christina, NT Double-lumen 16 Fr. 11/15/21  1622  Double-lumen  less than 1            Intake/Output Last 24 hours  Intake/Output Summary (Last 24 hours) at 11/15/2021 1917 Last data filed at 11/15/2021 1832 Gross per 24 hour  Intake --  Output 2900 ml  Net -2900 ml    Labs/Imaging Results for orders placed or performed during the hospital encounter of 11/15/21 (from the past 48 hour(s))  CBG monitoring, ED     Status: Abnormal   Collection Time: 11/15/21 11:58 AM  Result Value Ref Range   Glucose-Capillary 185 (H) 70 - 99 mg/dL    Comment: Glucose reference range applies only to samples taken after fasting for at least 8 hours.  Comprehensive metabolic panel     Status: Abnormal   Collection Time: 11/15/21 12:24 PM  Result Value Ref Range   Sodium 134 (L) 135 - 145 mmol/L   Potassium 5.9 (H) 3.5 - 5.1 mmol/L   Chloride 101 98 - 111 mmol/L   CO2 15 (L) 22 - 32 mmol/L   Glucose, Bld 198 (H) 70 - 99 mg/dL  Comment: Glucose reference range applies only to samples taken after fasting for at least 8 hours.   BUN 84 (H) 8 - 23 mg/dL   Creatinine, Ser 6.64 (H) 0.61 - 1.24 mg/dL   Calcium 8.1 (L) 8.9 - 10.3 mg/dL   Total Protein 5.7 (L) 6.5 - 8.1 g/dL   Albumin 2.8 (L) 3.5 - 5.0 g/dL   AST 17 15 - 41 U/L   ALT 14 0 - 44 U/L   Alkaline Phosphatase 69 38 - 126 U/L   Total Bilirubin 0.8 0.3 - 1.2 mg/dL   GFR, Estimated 8 (L) >60 mL/min    Comment: (NOTE) Calculated using the CKD-EPI Creatinine Equation (2021)    Anion gap 18 (H) 5 - 15    Comment: Performed at [facility], [place] [address] [place], [place] [address]  CBC     Status: Abnormal   Collection Time: 11/15/21 12:24 PM  Result Value Ref Range    WBC 15.3 (H) 4.0 - 10.5 K/uL   RBC 3.78 (L) 4.22 - 5.81 MIL/uL   Hemoglobin 11.2 (L) 13.0 - 17.0 g/dL   HCT 35.1 (L) 39.0 - 52.0 %   MCV 92.9 80.0 - 100.0 fL   MCH 29.6 26.0 - 34.0 pg   MCHC 31.9 30.0 - 36.0 g/dL   RDW 13.8 11.5 - 15.5 %   Platelets 152 150 - 400 K/uL   nRBC 0.0 0.0 - 0.2 %    Comment: Performed at [facility], [place] [address], [place], [place] [address]  Lactic acid, plasma     Status: Abnormal   Collection Time: 11/15/21 12:24 PM  Result Value Ref Range   Lactic Acid, Venous 2.5 (HH) 0.5 - 1.9 mmol/L    Comment: CRITICAL RESULT CALLED TO, READ BACK BY AND VERIFIED WITH: C Denica Web RN [id number] 11/15/2021 BY R VERAAR Performed at [facility], [place] [address] [place], [place] [address]   CK     Status: None   Collection Time: 11/15/21 12:24 PM  Result Value Ref Range   Total CK 167 49 - 397 U/L    Comment: Performed at [facility], [place] [address], [place], [place] [address]  Ammonia     Status: None   Collection Time: 11/15/21 12:43 PM  Result Value Ref Range   Ammonia 28 9 - 35 umol/L    Comment: Performed at [facility], [place] [address], [place], [place] [address]  Lactic acid, plasma     Status: Abnormal   Collection Time: 11/15/21  2:29 PM  Result Value Ref Range   Lactic Acid, Venous 3.0 (HH) 0.5 - 1.9 mmol/L    Comment: CRITICAL VALUE NOTED.  VALUE IS CONSISTENT WITH PREVIOUSLY REPORTED AND CALLED VALUE. Performed at [facility], [place] [address], [place], [place] [address]   Urinalysis, Routine w reflex microscopic     Status: Abnormal   Collection Time: 11/15/21  4:23 PM  Result Value Ref Range   Color, Urine YELLOW YELLOW   APPearance CLEAR CLEAR   Specific Gravity, Urine 1.012 1.005 - 1.030   pH 5.0 5.0 - 8.0   Glucose, UA 50 (A) NEGATIVE mg/dL   Hgb urine dipstick SMALL (A) NEGATIVE   Bilirubin Urine NEGATIVE NEGATIVE   Ketones, ur NEGATIVE NEGATIVE mg/dL   Protein, ur NEGATIVE NEGATIVE mg/dL   Nitrite NEGATIVE  NEGATIVE   Leukocytes,Ua NEGATIVE NEGATIVE   RBC / HPF 11-20 0 - 5 RBC/hpf  WBC, UA 0-5 0 - 5 WBC/hpf   Bacteria, UA RARE (A) NONE SEEN    Comment: Performed at [facility], [place] [address], [place], [place] [address]  Rapid urine drug screen (hospital performed)     Status: None   Collection Time: 11/15/21  4:23 PM  Result Value Ref Range   Opiates NONE DETECTED NONE DETECTED   Cocaine NONE DETECTED NONE DETECTED   Benzodiazepines NONE DETECTED NONE DETECTED   Amphetamines NONE DETECTED NONE DETECTED   Tetrahydrocannabinol NONE DETECTED NONE DETECTED   Barbiturates NONE DETECTED NONE DETECTED    Comment: (NOTE) DRUG SCREEN FOR MEDICAL PURPOSES ONLY.  IF CONFIRMATION IS NEEDED FOR ANY PURPOSE, NOTIFY LAB WITHIN 5 DAYS.  LOWEST DETECTABLE LIMITS FOR URINE DRUG SCREEN Drug Class                     Cutoff (ng/mL) Amphetamine and metabolites    1000 Barbiturate and metabolites    200 Benzodiazepine                 962 Tricyclics and metabolites     300 Opiates and metabolites        300 Cocaine and metabolites        300 THC                            50 Performed at [facility], [place] [address] [place],  [address]    US Renal  Result Date: 11/15/2021 CLINICAL DATA:  Renal failure EXAM: RENAL / URINARY TRACT ULTRASOUND COMPLETE COMPARISON:  None Available. FINDINGS: Right Kidney: Renal measurements: 11.1 x 5.3 x 4.5 = volume: 139 mL. Mild hydronephrosis. Normal parenchymal echogenicity. No mass visualized. Left Kidney: Renal measurements: 11.0 x 6.7 x 6.0 cm = volume: 230 mL. Mild hydronephrosis. Normal parenchymal echogenicity. No mass visualized. Bladder: Marked bladder distention. Other: Prostatomegaly, measuring 6.6 x 4.2 x 5.5 cm. IMPRESSION: Mild bilateral hydronephrosis, possibly related to outlet obstruction given associated bladder distention and prostatomegaly. Electronically Signed   By: Yetta Glassman M.D.   On: 11/15/2021 14:29   CT Head Wo  Contrast  Result Date: 11/15/2021 CLINICAL DATA:  Altered mental status EXAM: CT HEAD WITHOUT CONTRAST TECHNIQUE: Contiguous axial images were obtained from the base of the skull through the vertex without intravenous contrast. RADIATION DOSE REDUCTION: This exam was performed according to the departmental dose-optimization program which includes automated exposure control, adjustment of the mA and/or kV according to patient size and/or use of iterative reconstruction technique. COMPARISON:  06/02/2017 FINDINGS: Brain: There is slight patient motion in the some of the images limiting evaluation. As far as seen no acute intracranial findings are noted. There are no definite signs of bleeding within the cranium. Cortical sulci are prominent. There is decreased density in the periventricular and subcortical white matter. There is fluid density structure in the posterior aspect of posterior cranial fossa with no significant interval change possibly arachnoid cyst. Vascular: Unremarkable. Skull: Unremarkable. Sinuses/Orbits: There is mucosal thickening in the frontal ethmoid, sphenoid and maxillary sinuses. Other: Defects are seen in the crowns of multiple teeth and root abscesses suggesting significant dental disease. IMPRESSION: Motion limited study. As far as seen no acute intracranial findings are noted. Atrophy. Small-vessel disease. Chronic pansinusitis.  Significant dental disease. Electronically Signed   By: Elmer Picker M.D.   On: 11/15/2021 14:23   DG Chest 2 View  Result Date: 11/15/2021  CLINICAL DATA:  Sepsis.  Right shoulder pain.  Diabetes. EXAM: CHEST - 2 VIEW COMPARISON:  06/29/2016 FINDINGS: Low lung volumes. Indistinct airspace opacity at the right lung base, likely primarily in the right lower lobe although a component of middle lobe bandlike atelectasis or scarring is raised. The left lung appears clear. Atherosclerotic calcification of the aortic arch. Thoracic spondylosis. Heart size  within normal limits for projection. The patient is rotated to the right on today's radiograph, reducing diagnostic sensitivity and specificity. There is mild blunting of the right posterior costophrenic angle. IMPRESSION: 1. Indistinct airspace opacity at the right lung base, cannot exclude pneumonia. There is also some mild blunting of the right posterior costophrenic angle potentially representing a small right pleural effusion. 2.  Aortic Atherosclerosis (ICD10-I70.0). Electronically Signed   By: Van Clines M.D.   On: 11/15/2021 13:00    Pending Labs Unresulted Labs (From admission, onward)     Start     Ordered   11/16/21 9833  Basic metabolic panel  Tomorrow morning,   R        11/15/21 1754   11/16/21 0500  CBC  Tomorrow morning,   R        11/15/21 1754   11/16/21 0500  Lipid panel  Tomorrow morning,   R       Comments: fasting    11/15/21 1847   11/15/21 8250  Basic metabolic panel  Once,   STAT        11/15/21 1753   11/15/21 1755  TSH  Once,   R        11/15/21 1754   11/15/21 1755  Hemoglobin A1c  Once,   R        11/15/21 1754   11/15/21 1750  HIV Antibody (routine testing w rflx)  (HIV Antibody (Routine testing w reflex) panel)  Once,   R        11/15/21 1754   11/15/21 1749  Brain natriuretic peptide  Once,   URGENT        11/15/21 1748   11/15/21 1719  Lactic acid, plasma  STAT Now then every 3 hours,   R      11/15/21 1720   11/15/21 1551  Acetaminophen level  Add-on,   AD        11/15/21 1550   11/15/21 1551  Ethanol  Add-on,   AD        11/15/21 1550   53/97/67 3419  Salicylate level  Add-on,   AD        11/15/21 1550   11/15/21 1330  Protime-INR  Once,   STAT        11/15/21 1330   11/15/21 1155  Culture, blood (Routine x 2)  BLOOD CULTURE X 2,   R      11/15/21 1154            Vitals/Pain Today's Vitals   11/15/21 1645 11/15/21 1700 11/15/21 1715 11/15/21 1900  BP: (!) 141/59 (!) 142/53 (!) 148/57 (!) 140/53  Pulse: 69 70 71 68  Resp: (!)  24 19 (!) 25 20  Temp:      TempSrc:      SpO2: 98% 98% 98% 96%  PainSc:        Isolation Precautions No active isolations  Medications Medications  lactated ringers infusion (has no administration in time range)  azithromycin (ZITHROMAX) 500 mg in sodium chloride 0.9 % 250 mL IVPB (has no administration in time range)  cefTRIAXone (ROCEPHIN) 1 g in sodium chloride 0.9 % 100 mL IVPB (has no administration in time range)  sodium zirconium cyclosilicate (LOKELMA) packet 10 g (10 g Oral Given 11/15/21 1425)  calcium gluconate inj 10% (1 g) URGENT USE ONLY! (1 g Intravenous Given 11/15/21 1424)  lactated ringers bolus 2,000 mL (2,000 mLs Intravenous New Bag/Given 11/15/21 1426)    Mobility manual wheelchair High fall risk   Focused Assessments Neuro Assessment Handoff:  Swallow screen pass?  Yes he passed, but has difficulty at baseline         Neuro Assessment: Exceptions to [facility] Neuro Checks:      Last Documented NIHSS Modified Score:   Has TPA been given? No If patient is a Neuro Trauma and patient is going to OR before floor call report to [place] nurse: [phone] or [phone]   R Recommendations: See Admitting Provider Note  Report given to:   Additional Notes:

## 2021-11-15 NOTE — ED Provider Notes (Signed)
[facility] EMERGENCY DEPARTMENT Provider Note   CSN: [id number] Arrival date & time: 11/15/21  1148     History  Chief Complaint  Patient presents with   Code Sepsis    RIKI Chapman is a 71 y.o. male.  HPI     71 year old male comes in with chief complaint of weakness.  Per EMS, patient was found on his bed covered with fecal and urine material.  He has been complaining of generalized weakness.  His GCS was 9, they activated code sepsis on their way to the ER.  Patient right now more alert.  His GCS is 14 (eyes were closed, until we called him).  He states that he has been feeling unwell for the last week or so.  He has no strength to get up.  Normally he is able to walk with a cane.  He denies any headache, neck pain, cough, chest pain, shortness of breath, abdominal pain, urinary tract infection-like symptoms, new rashes.  He states that he has prostate issues and incontinence problems.  He also indicates that he has not really had any significant appetite the last few days.  Spoke with patient's ex-wife later on.  She indicated that she saw the patient yesterday and he was profoundly weak as well and had soiled himself.  She went to check on him again, and he looked worse therefore she called EMS.  According to her, patient has been weak for the last several days as well.  He had a fall about 4 weeks ago.  Patient was noted to be slurring a little bit during our evaluation.  He thinks that his speech is slightly more slurred than usual.  He also thinks that his right side is little bit more weaker than usual.  Patient denies any vision changes, but the wife indicates that she has noticed patient walking " like a blind person", because he is not seeing clearly like he was.  This is not a new symptom, it has been present over the last few weeks.  Home Medications Prior to Admission medications   Medication Sig Start Date End Date Taking? Authorizing Provider   amLODipine (NORVASC) 5 MG tablet TAKE 1 TABLET BY MOUTH EVERYDAY AT BEDTIME 09/03/21   Ganta, Anupa, DO  atorvastatin (LIPITOR) 40 MG tablet Take 1 tablet (40 mg total) by mouth daily. 12/18/20   Donney Dice, DO  Blood Gluc Meter Disp-Strips (SIDEKICK BLOOD GLUCOSE SYSTEM) DEVI Use for daily testing 08/06/16   Lauree Chandler, NP  Blood Pressure Monitor DEVI 1 Units by Does not apply route daily. Check  blood pressure once daily, record readings, bring to doctor's office. 11/27/20   Daisy Floro, DO  Capsicum, Cayenne, (CAYENNE PEPPER PO) Take 1 tablet by mouth at bedtime.    [provider]  carvedilol (COREG) 25 MG tablet TAKE 1 TABLET BY MOUTH TWICE A DAY WITH MEALS 03/08/21   Ganta, Anupa, DO  chlorthalidone (HYGROTON) 25 MG tablet TAKE 1 TABLET (25 MG TOTAL) BY MOUTH DAILY WITH LUNCH. 06/13/20   Daisy Floro, DO  clopidogrel (PLAVIX) 75 MG tablet Take 1 tablet (75 mg total) by mouth daily. 07/08/21   Ganta, Anupa, DO  Insulin Pen Needle (B-D ULTRAFINE III SHORT PEN) 31G X 8 MM MISC USE WITH LEVEMIR 08/06/16   Lauree Chandler, NP  Insulin Syringe-Needle U-100 28G X 1/2" 1 ML MISC 1 each by Does not apply route 3 (three) times daily. 08/06/16   Dewaine Oats,  Carlos American, NP  lisinopril (ZESTRIL) 40 MG tablet Take 1 tablet (40 mg total) by mouth daily. 07/08/21   Ganta, Anupa, DO  metFORMIN (GLUCOPHAGE) 1000 MG tablet TAKE 1 TABLET (1,000 MG TOTAL) BY MOUTH 2 (TWO) TIMES DAILY WITH A MEAL. 06/13/20   Daisy Floro, DO  Nutritional Supplements (GLUCERNA ADVANCE SHAKE) LIQD Take 1 Can by mouth daily. Patient not taking: Reported on 03/25/2021 01/27/18   Nuala Alpha, MD  sertraline (ZOLOFT) 100 MG tablet TAKE 1 TABLET BY MOUTH EVERY DAY 04/08/21   Donney Dice, DO      Allergies    Patient has no known allergies.    Review of Systems   Review of Systems  All other systems reviewed and are negative.   Physical Exam Updated Vital Signs BP (!) 143/60   Pulse 63   Temp 98.5 F  (36.9 C) (Rectal)   Resp (!) 26   SpO2 98%  Physical Exam Vitals and nursing note reviewed.  Constitutional:      Appearance: He is well-developed.  HENT:     Head: Atraumatic.  Eyes:     Extraocular Movements: Extraocular movements intact.     Pupils: Pupils are equal, round, and reactive to light.     Comments: Bedside visual acuity screen with finger counting is normal.  Patient's peripheral visual fields are also intact.  Patient does appear to have slight right-sided disconjugate gaze (right eye).  Cardiovascular:     Rate and Rhythm: Normal rate.  Pulmonary:     Effort: Pulmonary effort is normal.  Musculoskeletal:     Cervical back: Neck supple.     Comments: Patient has right upper extremity and right lower extremity paralysis with strength being 1 out of 5.  Patient able to raise left upper and left lower extremity Gross sensory exam is normal and reassuring Patient does have mild dysarthria  Skin:    General: Skin is warm.  Neurological:     Mental Status: He is alert and oriented to person, place, and time.     ED Results / Procedures / Treatments   Labs (all labs ordered are listed, but only abnormal results are displayed) Labs Reviewed  COMPREHENSIVE METABOLIC PANEL - Abnormal; Notable for the following components:      Result Value   Sodium 134 (*)    Potassium 5.9 (*)    CO2 15 (*)    Glucose, Bld 198 (*)    BUN 84 (*)    Creatinine, Ser 6.64 (*)    Calcium 8.1 (*)    Total Protein 5.7 (*)    Albumin 2.8 (*)    GFR, Estimated 8 (*)    Anion gap 18 (*)    All other components within normal limits  CBC - Abnormal; Notable for the following components:   WBC 15.3 (*)    RBC 3.78 (*)    Hemoglobin 11.2 (*)    HCT 35.1 (*)    All other components within normal limits  LACTIC ACID, PLASMA - Abnormal; Notable for the following components:   Lactic Acid, Venous 2.5 (*)    All other components within normal limits  LACTIC ACID, PLASMA - Abnormal; Notable  for the following components:   Lactic Acid, Venous 3.0 (*)    All other components within normal limits  CBG MONITORING, ED - Abnormal; Notable for the following components:   Glucose-Capillary 185 (*)    All other components within normal limits  CULTURE, BLOOD (ROUTINE X 2)  CULTURE, BLOOD (ROUTINE X 2)  AMMONIA  CK  URINALYSIS, ROUTINE W REFLEX MICROSCOPIC  PROTIME-INR  ACETAMINOPHEN LEVEL  ETHANOL  SALICYLATE LEVEL  RAPID URINE DRUG SCREEN, HOSP PERFORMED    EKG EKG Interpretation  Date/Time:  Friday November 15 2021 12:18:01 EDT Ventricular Rate:  60 PR Interval:  155 QRS Duration: 86 QT Interval:  419 QTC Calculation: 419 R Axis:   37 Text Interpretation: Sinus rhythm No acute changes No significant change since last tracing normal intervals Confirmed by Varney Biles [phone]) on 11/15/2021 1:40:46 PM  Radiology US Renal  Result Date: 11/15/2021 CLINICAL DATA:  Renal failure EXAM: RENAL / URINARY TRACT ULTRASOUND COMPLETE COMPARISON:  None Available. FINDINGS: Right Kidney: Renal measurements: 11.1 x 5.3 x 4.5 = volume: 139 mL. Mild hydronephrosis. Normal parenchymal echogenicity. No mass visualized. Left Kidney: Renal measurements: 11.0 x 6.7 x 6.0 cm = volume: 230 mL. Mild hydronephrosis. Normal parenchymal echogenicity. No mass visualized. Bladder: Marked bladder distention. Other: Prostatomegaly, measuring 6.6 x 4.2 x 5.5 cm. IMPRESSION: Mild bilateral hydronephrosis, possibly related to outlet obstruction given associated bladder distention and prostatomegaly. Electronically Signed   By: Yetta Glassman M.D.   On: 11/15/2021 14:29   CT Head Wo Contrast  Result Date: 11/15/2021 CLINICAL DATA:  Altered mental status EXAM: CT HEAD WITHOUT CONTRAST TECHNIQUE: Contiguous axial images were obtained from the base of the skull through the vertex without intravenous contrast. RADIATION DOSE REDUCTION: This exam was performed according to the departmental dose-optimization program  which includes automated exposure control, adjustment of the mA and/or kV according to patient size and/or use of iterative reconstruction technique. COMPARISON:  06/02/2017 FINDINGS: Brain: There is slight patient motion in the some of the images limiting evaluation. As far as seen no acute intracranial findings are noted. There are no definite signs of bleeding within the cranium. Cortical sulci are prominent. There is decreased density in the periventricular and subcortical white matter. There is fluid density structure in the posterior aspect of posterior cranial fossa with no significant interval change possibly arachnoid cyst. Vascular: Unremarkable. Skull: Unremarkable. Sinuses/Orbits: There is mucosal thickening in the frontal ethmoid, sphenoid and maxillary sinuses. Other: Defects are seen in the crowns of multiple teeth and root abscesses suggesting significant dental disease. IMPRESSION: Motion limited study. As far as seen no acute intracranial findings are noted. Atrophy. Small-vessel disease. Chronic pansinusitis.  Significant dental disease. Electronically Signed   By: Elmer Picker M.D.   On: 11/15/2021 14:23   DG Chest 2 View  Result Date: 11/15/2021 CLINICAL DATA:  Sepsis.  Right shoulder pain.  Diabetes. EXAM: CHEST - 2 VIEW COMPARISON:  06/29/2016 FINDINGS: Low lung volumes. Indistinct airspace opacity at the right lung base, likely primarily in the right lower lobe although a component of middle lobe bandlike atelectasis or scarring is raised. The left lung appears clear. Atherosclerotic calcification of the aortic arch. Thoracic spondylosis. Heart size within normal limits for projection. The patient is rotated to the right on today's radiograph, reducing diagnostic sensitivity and specificity. There is mild blunting of the right posterior costophrenic angle. IMPRESSION: 1. Indistinct airspace opacity at the right lung base, cannot exclude pneumonia. There is also some mild blunting  of the right posterior costophrenic angle potentially representing a small right pleural effusion. 2.  Aortic Atherosclerosis (ICD10-I70.0). Electronically Signed   By: Van Clines M.D.   On: 11/15/2021 13:00    Procedures .Critical Care  Performed by: Varney Biles, MD Authorized by: Varney Biles, MD   Critical care  provider statement:    Critical care time (minutes):  52   Critical care was necessary to treat or prevent imminent or life-threatening deterioration of the following conditions:  Respiratory failure   Critical care was time spent personally by me on the following activities:  Development of treatment plan with patient or surrogate, discussions with consultants, evaluation of patient's response to treatment, examination of patient, ordering and review of laboratory studies, ordering and review of radiographic studies, ordering and performing treatments and interventions, pulse oximetry, re-evaluation of patient's condition and review of old charts     Medications Ordered in ED Medications  lactated ringers infusion (has no administration in time range)  sodium zirconium cyclosilicate (LOKELMA) packet 10 g (10 g Oral Given 11/15/21 1425)  calcium gluconate inj 10% (1 g) URGENT USE ONLY! (1 g Intravenous Given 11/15/21 1424)  lactated ringers bolus 2,000 mL (2,000 mLs Intravenous New Bag/Given 11/15/21 1426)    ED Course/ Medical Decision Making/ A&P Clinical Course as of 11/15/21 1558  Fri Nov 15, 2021  1556 WBC(!): 15.3 No clear source of infection.  Patient is afebrile.  Elevated white count likely due to acute stress [AN]  1556 Anion gap(!): 18 Anion gap is likely because of dehydration and not DKA.  Blood sugar is in the 190s. [AN]  1557 Creatinine(!): 6.64 New renal failure, 2 L of IV fluid ordered.  Ultrasound kidney and bladder scan ordered.  Bladder scan did reveal about 750 cc of urine therefore Foley catheter also ordered. [AN]  1557 CT Head Wo  Contrast CT scan and x-ray visualized and interpreted independently.  X-ray shows no overt volume overload.  CT scan shows no bleed.  Questionable reactivation of old stroke given that he does think that the right-sided weakness is worse than usual, wife thinks that the balance is little off and there is some vision changes, and the patient has increased slurred speech.  Admitting team can consider MRI.  Patient is outside of any stroke treatment window. [AN]    Clinical Course User Index [AN] Varney Biles, MD                           Medical Decision Making Amount and/or Complexity of Data Reviewed Labs: ordered. Radiology: ordered.  Risk Prescription drug management. Decision regarding hospitalization.   This patient presents to the ED with chief complaint(s) of weakness with pertinent past medical history of stroke with right-sided weakness, dysarthria due to stroke, peripheral artery disease, hypertension, hyperlipidemia and diabetes which further complicates the presenting complaint. The complaint involves an extensive differential diagnosis and also carries with it a high risk of complications and morbidity.    The differential diagnosis includes : Severe electrolyte abnormality, renal failure, severe anemia, infection, stroke, subdural hematoma.  The initial plan is to order basic blood work for now.  We will also get CT scan of the head, chest x-ray.   Additional history obtained: Additional history obtained from EMS   and patient's ex-wife Records reviewed previous admission documents and Primary Care Documents  Independent labs interpretation:  The following labs were independently interpreted: See above  Independent visualization of imaging: - I independently visualized the following imaging with scope of interpretation limited to determining acute life threatening conditions related to emergency care: CT interpretation in the work-up space above  Treatment and  Reassessment: Patient continues to have the same mental status.  IV fluid ordered.  Foley catheter ordered. Family updated on plan  to admit.  We have not consulted nephrology or urology.  Plan would be to give IV fluids, decompress the bladder and reassess the patient to see if he needs nephrology consultation or urology consultation.  Final Clinical Impression(s) / ED Diagnoses Final diagnoses:  Acute renal failure, unspecified acute renal failure type ([place])  Acute hyperkalemia  Hydronephrosis, unspecified hydronephrosis type    Rx / DC Orders ED Discharge Orders     None         Varney Biles, MD 11/15/21 1600

## 2021-11-15 NOTE — Assessment & Plan Note (Addendum)
BP in 24hrs 137-150/55-66. This morning 150/60. - Monitor BP with routine vitals - Continue amlodipine '5mg'$  - Continue home carvedilol -Restarted home lisinopril at lower dose '20mg'$  QD  - holding chlorthalidone (reported not taking) due to AKI

## 2021-11-15 NOTE — Assessment & Plan Note (Deleted)
Right sided pleural effusion noted on chest x-ray, blunting the costophrenic angle.  Patient with 3+ pitting edema of bilateral lower extremities to the knees and right upper extremity in the hand and forearm.  As well, right-sided basilar crackles auscultated on physical exam.  Last echo 2018 with LVEF 55 to 60%, no regional wall motion abnormalities, G2 DD.  Need to rule out CHF versus aspiration pneumonia. - Repeat TTE -Close monitoring of fluid status

## 2021-11-15 NOTE — ED Triage Notes (Addendum)
Patient with GCS 9, no movement on right side, smells of urine and covered in feces and vomit.  Ex wife has been checking on patient and he has been more and more confused. Lives at home alone.

## 2021-11-15 NOTE — Assessment & Plan Note (Signed)
>>  ASSESSMENT AND PLAN FOR ACUTE RENAL FAILURE SUPERIMPOSED ON STAGE 2 CHRONIC KIDNEY DISEASE (HCC) WRITTEN ON 11/24/2021  3:57 AM BY LILLAND, ALANA, DO  Creatinine stable. Patient is medically stable for discharge to SNF and pending placement. -monitor daily BMP  -Strict I/Os -avoid nephrotoxic agents

## 2021-11-15 NOTE — Assessment & Plan Note (Deleted)
Unclear how much infection is contributing to overall picture.  He has had no signs to suggest pneumonia other than leukocytosis and possible infiltrate on imaging.  We will continue empiric CAP treatment for now. - continue IV ceftriaxone, azithromycin - f/u blood cultures

## 2021-11-16 ENCOUNTER — Encounter (HOSPITAL_COMMUNITY): Payer: Self-pay | Admitting: Family Medicine

## 2021-11-16 ENCOUNTER — Observation Stay (HOSPITAL_COMMUNITY): Payer: Medicare HMO

## 2021-11-16 DIAGNOSIS — R339 Retention of urine, unspecified: Secondary | ICD-10-CM | POA: Diagnosis not present

## 2021-11-16 DIAGNOSIS — E875 Hyperkalemia: Secondary | ICD-10-CM

## 2021-11-16 DIAGNOSIS — E1159 Type 2 diabetes mellitus with other circulatory complications: Secondary | ICD-10-CM | POA: Diagnosis not present

## 2021-11-16 DIAGNOSIS — I69391 Dysphagia following cerebral infarction: Secondary | ICD-10-CM | POA: Diagnosis not present

## 2021-11-16 DIAGNOSIS — I130 Hypertensive heart and chronic kidney disease with heart failure and stage 1 through stage 4 chronic kidney disease, or unspecified chronic kidney disease: Secondary | ICD-10-CM | POA: Diagnosis not present

## 2021-11-16 DIAGNOSIS — N182 Chronic kidney disease, stage 2 (mild): Secondary | ICD-10-CM | POA: Diagnosis not present

## 2021-11-16 DIAGNOSIS — E872 Acidosis, unspecified: Secondary | ICD-10-CM | POA: Diagnosis not present

## 2021-11-16 DIAGNOSIS — E1151 Type 2 diabetes mellitus with diabetic peripheral angiopathy without gangrene: Secondary | ICD-10-CM | POA: Diagnosis not present

## 2021-11-16 DIAGNOSIS — I5031 Acute diastolic (congestive) heart failure: Secondary | ICD-10-CM | POA: Diagnosis not present

## 2021-11-16 DIAGNOSIS — Z794 Long term (current) use of insulin: Secondary | ICD-10-CM

## 2021-11-16 DIAGNOSIS — N1330 Unspecified hydronephrosis: Secondary | ICD-10-CM | POA: Diagnosis not present

## 2021-11-16 DIAGNOSIS — R4182 Altered mental status, unspecified: Secondary | ICD-10-CM | POA: Diagnosis not present

## 2021-11-16 DIAGNOSIS — I69392 Facial weakness following cerebral infarction: Secondary | ICD-10-CM | POA: Diagnosis not present

## 2021-11-16 DIAGNOSIS — Z823 Family history of stroke: Secondary | ICD-10-CM | POA: Diagnosis not present

## 2021-11-16 DIAGNOSIS — N401 Enlarged prostate with lower urinary tract symptoms: Secondary | ICD-10-CM | POA: Diagnosis not present

## 2021-11-16 DIAGNOSIS — Z86718 Personal history of other venous thrombosis and embolism: Secondary | ICD-10-CM | POA: Diagnosis not present

## 2021-11-16 DIAGNOSIS — E785 Hyperlipidemia, unspecified: Secondary | ICD-10-CM | POA: Diagnosis not present

## 2021-11-16 DIAGNOSIS — R1310 Dysphagia, unspecified: Secondary | ICD-10-CM | POA: Diagnosis not present

## 2021-11-16 DIAGNOSIS — I69322 Dysarthria following cerebral infarction: Secondary | ICD-10-CM | POA: Diagnosis not present

## 2021-11-16 DIAGNOSIS — R32 Unspecified urinary incontinence: Secondary | ICD-10-CM | POA: Diagnosis not present

## 2021-11-16 DIAGNOSIS — Z8249 Family history of ischemic heart disease and other diseases of the circulatory system: Secondary | ICD-10-CM | POA: Diagnosis not present

## 2021-11-16 DIAGNOSIS — N139 Obstructive and reflux uropathy, unspecified: Secondary | ICD-10-CM | POA: Diagnosis not present

## 2021-11-16 DIAGNOSIS — N179 Acute kidney failure, unspecified: Secondary | ICD-10-CM | POA: Diagnosis not present

## 2021-11-16 DIAGNOSIS — Z602 Problems related to living alone: Secondary | ICD-10-CM | POA: Diagnosis not present

## 2021-11-16 DIAGNOSIS — I69351 Hemiplegia and hemiparesis following cerebral infarction affecting right dominant side: Secondary | ICD-10-CM | POA: Diagnosis not present

## 2021-11-16 DIAGNOSIS — I5032 Chronic diastolic (congestive) heart failure: Secondary | ICD-10-CM | POA: Diagnosis not present

## 2021-11-16 DIAGNOSIS — E1129 Type 2 diabetes mellitus with other diabetic kidney complication: Secondary | ICD-10-CM | POA: Diagnosis not present

## 2021-11-16 DIAGNOSIS — R69 Illness, unspecified: Secondary | ICD-10-CM | POA: Diagnosis not present

## 2021-11-16 DIAGNOSIS — F32A Depression, unspecified: Secondary | ICD-10-CM | POA: Diagnosis present

## 2021-11-16 DIAGNOSIS — W19XXXA Unspecified fall, initial encounter: Secondary | ICD-10-CM | POA: Diagnosis not present

## 2021-11-16 DIAGNOSIS — I10 Essential (primary) hypertension: Secondary | ICD-10-CM | POA: Diagnosis not present

## 2021-11-16 DIAGNOSIS — E871 Hypo-osmolality and hyponatremia: Secondary | ICD-10-CM | POA: Diagnosis not present

## 2021-11-16 DIAGNOSIS — E1122 Type 2 diabetes mellitus with diabetic chronic kidney disease: Secondary | ICD-10-CM | POA: Diagnosis not present

## 2021-11-16 DIAGNOSIS — N400 Enlarged prostate without lower urinary tract symptoms: Secondary | ICD-10-CM | POA: Diagnosis not present

## 2021-11-16 LAB — CBC
HCT: 32.8 % — ABNORMAL LOW (ref 39.0–52.0)
Hemoglobin: 10.8 g/dL — ABNORMAL LOW (ref 13.0–17.0)
MCH: 29.3 pg (ref 26.0–34.0)
MCHC: 32.9 g/dL (ref 30.0–36.0)
MCV: 89.1 fL (ref 80.0–100.0)
Platelets: 132 10*3/uL — ABNORMAL LOW (ref 150–400)
RBC: 3.68 MIL/uL — ABNORMAL LOW (ref 4.22–5.81)
RDW: 13.7 % (ref 11.5–15.5)
WBC: 12.5 10*3/uL — ABNORMAL HIGH (ref 4.0–10.5)
nRBC: 0 % (ref 0.0–0.2)

## 2021-11-16 LAB — BASIC METABOLIC PANEL
Anion gap: 12 (ref 5–15)
BUN: 76 mg/dL — ABNORMAL HIGH (ref 8–23)
CO2: 17 mmol/L — ABNORMAL LOW (ref 22–32)
Calcium: 7.6 mg/dL — ABNORMAL LOW (ref 8.9–10.3)
Chloride: 105 mmol/L (ref 98–111)
Creatinine, Ser: 5.06 mg/dL — ABNORMAL HIGH (ref 0.61–1.24)
GFR, Estimated: 12 mL/min — ABNORMAL LOW (ref >60)
Glucose, Bld: 181 mg/dL — ABNORMAL HIGH (ref 70–99)
Potassium: 4.7 mmol/L (ref 3.5–5.1)
Sodium: 134 mmol/L — ABNORMAL LOW (ref 135–145)

## 2021-11-16 LAB — BLOOD GAS, VENOUS
Acid-base deficit: 1.7 mmol/L (ref 0.0–2.0)
Bicarbonate: 22.1 mmol/L (ref 20.0–28.0)
Drawn by: 54831
O2 Saturation: 94 %
Patient temperature: 37
pCO2, Ven: 34 mmHg — ABNORMAL LOW (ref 44–60)
pH, Ven: 7.42 (ref 7.25–7.43)
pO2, Ven: 62 mmHg — ABNORMAL HIGH (ref 32–45)

## 2021-11-16 LAB — ECHOCARDIOGRAM COMPLETE
Area-P 1/2: 2.87 cm2
Height: 67 in
S' Lateral: 2.4 cm
Weight: 3393.32 oz

## 2021-11-16 LAB — GLUCOSE, CAPILLARY
Glucose-Capillary: 210 mg/dL — ABNORMAL HIGH (ref 70–99)
Glucose-Capillary: 239 mg/dL — ABNORMAL HIGH (ref 70–99)

## 2021-11-16 LAB — LIPID PANEL
Cholesterol: 108 mg/dL (ref 0–200)
HDL: 37 mg/dL — ABNORMAL LOW (ref >40)
LDL Cholesterol: 59 mg/dL (ref 0–99)
Total CHOL/HDL Ratio: 2.9 RATIO
Triglycerides: 62 mg/dL (ref <150)
VLDL: 12 mg/dL (ref 0–40)

## 2021-11-16 LAB — LACTIC ACID, PLASMA: Lactic Acid, Venous: 2.1 mmol/L (ref 0.5–1.9)

## 2021-11-16 MED ORDER — HEPARIN SODIUM (PORCINE) 5000 UNIT/ML IJ SOLN
5000.0000 [IU] | Freq: Three times a day (TID) | INTRAMUSCULAR | Status: DC
  Administered 2021-11-16 – 2021-11-25 (×27): 5000 [IU] via SUBCUTANEOUS
  Filled 2021-11-16 (×25): qty 1

## 2021-11-16 MED ORDER — CLOPIDOGREL BISULFATE 75 MG PO TABS
75.0000 mg | ORAL_TABLET | Freq: Every day | ORAL | Status: DC
Start: 2021-11-16 — End: 2021-11-25
  Administered 2021-11-16 – 2021-11-25 (×10): 75 mg via ORAL
  Filled 2021-11-16 (×10): qty 1

## 2021-11-16 MED ORDER — INSULIN ASPART 100 UNIT/ML IJ SOLN
0.0000 [IU] | Freq: Three times a day (TID) | INTRAMUSCULAR | Status: DC
  Administered 2021-11-17: 2 [IU] via SUBCUTANEOUS
  Administered 2021-11-17: 5 [IU] via SUBCUTANEOUS
  Administered 2021-11-18: 1 [IU] via SUBCUTANEOUS
  Administered 2021-11-18: 5 [IU] via SUBCUTANEOUS
  Administered 2021-11-19 (×2): 3 [IU] via SUBCUTANEOUS
  Administered 2021-11-19: 2 [IU] via SUBCUTANEOUS
  Administered 2021-11-20: 1 [IU] via SUBCUTANEOUS

## 2021-11-16 MED ORDER — ORAL CARE MOUTH RINSE: 15.0000 mL | OROMUCOSAL | Status: DC | PRN

## 2021-11-16 MED ORDER — SODIUM CHLORIDE 0.9 % IV SOLN: INTRAVENOUS | Status: DC

## 2021-11-16 MED ORDER — ORAL CARE MOUTH RINSE
15.0000 mL | OROMUCOSAL | Status: DC
  Administered 2021-11-16 – 2021-11-24 (×19): 15 mL via OROMUCOSAL

## 2021-11-16 MED ORDER — INSULIN ASPART 100 UNIT/ML IJ SOLN
0.0000 [IU] | Freq: Every day | INTRAMUSCULAR | Status: DC
  Administered 2021-11-16 – 2021-11-17 (×2): 2 [IU] via SUBCUTANEOUS
  Administered 2021-11-18: 4 [IU] via SUBCUTANEOUS
  Administered 2021-11-19: 3 [IU] via SUBCUTANEOUS

## 2021-11-16 MED ORDER — SERTRALINE HCL 100 MG PO TABS
100.0000 mg | ORAL_TABLET | Freq: Every day | ORAL | Status: DC
Start: 2021-11-16 — End: 2021-11-25
  Administered 2021-11-16 – 2021-11-25 (×10): 100 mg via ORAL
  Filled 2021-11-16 (×10): qty 1

## 2021-11-16 MED ORDER — CHLORHEXIDINE GLUCONATE CLOTH 2 % EX PADS
6.0000 | MEDICATED_PAD | Freq: Every day | CUTANEOUS | Status: DC
Start: 2021-11-16 — End: 2021-11-25
  Administered 2021-11-16 – 2021-11-25 (×7): 6 via TOPICAL

## 2021-11-16 NOTE — Consult Note (Signed)
Renal Service Consult Note [facility]  [name] 11/16/2021 Sol Blazing, MD Requesting Physician: Dr. Gwendlyn Deutscher  Reason for Consult: AKI HPI: The patient is a 71 y.o. year-old w/ hx of CVA, DM2 on insulin, HTN, PAD who presented brought by EMS to ED covered in feces and vomit  w/ AMS. Lives at home alone. In ED creat was 6.64 (baseline 1.0-1.2), thought to have possibly PNA and started on IV abx. Did have fever and wbc 12.5 in ED. Renal US showed bilat hydronephrosis w/ very large bladder and large prostate as well. Foley cath was placed and IVF"s were given. Creat improved down to 5.6 late last night and down to 5.0 this morning.  We are asked to see for renal failure.   Pt seen in room. Pt had CVA and has RUE and RLE weakness. He lives alone at home, has an aide on some days. He walks w/ a cane or a walker.   He noted difficulty voided for about 4-5 days prior to presenting. He kept having to try harder and harder to get the urine out of his bladder. "Sometimes I would just have to gush it out". 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   Diabetes mellitus    DVT (deep venous thrombosis) (HCC)    Hypertension    PAD (peripheral artery disease) ([place])    Stroke ([place])    2008   Past Surgical History  Past Surgical History:  Procedure Laterality Date   COLONOSCOPY     None     Family History  Family History  Problem Relation Age of Onset   Diabetes Mother    Heart attack Mother    Hypertension Mother    Diabetes Sister    Hypertension Sister    Stroke Sister        2017   Hypertension Brother    Hypertension Daughter     Hypertension Son    Colon cancer Neg Hx    Esophageal cancer Neg Hx    Stomach cancer Neg Hx    Rectal cancer Neg Hx    Social History  reports that he has never smoked. He has never used smokeless tobacco. He reports that he does not drink alcohol and does not use drugs. Allergies  Allergies  Allergen Reactions   Pork Allergy    Shrimp Flavor Other (See Comments)    Doesn't like it   Home medications Prior to Admission medications   Medication Sig Start Date End Date Taking? Authorizing Provider  amLODipine (NORVASC) 5 MG tablet TAKE 1 TABLET BY MOUTH EVERYDAY AT BEDTIME Patient taking differently: Take 5 mg by mouth daily. 09/03/21  Yes Ganta, Anupa, DO  Capsicum, Cayenne, (CAYENNE PEPPER PO) Take 1 tablet by mouth at bedtime.   Yes [provider]  carvedilol (COREG) 25 MG tablet TAKE 1 TABLET BY MOUTH TWICE A DAY WITH MEALS Patient taking differently: Take 25 mg by mouth 2 (two) times daily with a meal. 03/08/21  Yes Ganta, Anupa, DO  clopidogrel (PLAVIX) 75 MG tablet Take 1 tablet (75 mg total) by mouth daily.  07/08/21  Yes Ganta, Anupa, DO  lisinopril (ZESTRIL) 40 MG tablet Take 1 tablet (40 mg total) by mouth daily. 07/08/21  Yes Ganta, Anupa, DO  metFORMIN (GLUCOPHAGE) 1000 MG tablet TAKE 1 TABLET (1,000 MG TOTAL) BY MOUTH 2 (TWO) TIMES DAILY WITH A MEAL. 06/13/20  Yes Milus Banister C, DO  sertraline (ZOLOFT) 100 MG tablet TAKE 1 TABLET BY MOUTH EVERY DAY Patient taking differently: Take 100 mg by mouth daily. 04/08/21  Yes Ganta, Anupa, DO  atorvastatin (LIPITOR) 40 MG tablet Take 1 tablet (40 mg total) by mouth daily. Patient not taking: Reported on 11/15/2021 12/18/20   Donney Dice, DO  Blood Gluc Meter Disp-Strips (SIDEKICK BLOOD GLUCOSE SYSTEM) DEVI Use for daily testing 08/06/16   Lauree Chandler, NP  Blood Pressure Monitor DEVI 1 Units by Does not apply route daily. Check  blood pressure once daily, record readings, bring to doctor's office. 11/27/20   Daisy Floro, DO  chlorthalidone (HYGROTON) 25 MG tablet TAKE 1 TABLET (25 MG TOTAL) BY MOUTH DAILY WITH LUNCH. Patient not taking: Reported on 11/15/2021 06/13/20   Milus Banister C, DO  Insulin Pen Needle (B-D ULTRAFINE III SHORT PEN) 31G X 8 MM MISC USE WITH LEVEMIR 08/06/16   Lauree Chandler, NP  Insulin Syringe-Needle U-100 28G X 1/2" 1 ML MISC 1 each by Does not apply route 3 (three) times daily. 08/06/16   Lauree Chandler, NP  Nutritional Supplements (GLUCERNA ADVANCE SHAKE) LIQD Take 1 Can by mouth daily. Patient not taking: Reported on 03/25/2021 01/27/18   Nuala Alpha, MD     Vitals:   11/16/21 0752 11/16/21 1106 11/16/21 1626 11/16/21 2020  BP: (!) 129/49 (!) 136/49 (!) 137/57 (!) 142/59  Pulse: 68 64 62 64  Resp: 15 17 17 11   Temp: 99.3 F (37.4 C) 98.7 F (37.1 C) 98.5 F (36.9 C) 99.8 F (37.7 C)  TempSrc: Oral Oral Oral Oral  SpO2: 99% 99% 99% 96%  Weight:      Height:       Exam Gen alert, no distress No rash, cyanosis or gangrene Sclera anicteric, throat clear  No jvd or bruits Chest clear bilat to bases, no rales/ wheezing RRR no MRG Abd soft ntnd no mass or ascites +bs GU normal male w/ foley cath in place MS no joint effusions or deformity Ext 1-2+ RLE pretib edema, no other edema Neuro is alert, R arm and leg are plegic    Home meds include - amlodipine 5, carvedilol 25 bid, clopidogrel, lisinopril, metformin, sertraline, chlorthalidone 25 qd, insulin levemir      Date   Creat  eGFR    2015- 17  1.02- 1.21 73- 89    2018   1.35 >> 0.99 AKI episode, >60     2019   1.75 >> 0.95 AKI episode, 45 >> 96 ml/min    Jun 18, 2018 1.09  81    November 27, 2020 1.14  70 ml/min      11/15/21  6.64  8 ml/min    11/15/21  5.60  10 ml/min    11/16/21  5.06  12  ml/min        UA 6/16 - protein neg, 11-20 rbc, 0-5 wbc, rare bact   Renal US 6/16 -  11 cm kidneys w/ bilat mild hydronephrosis, marked bladder distension and prostatomegaly. Bilat hydronephrosis may be  related to outlet obstruction given associated bladder distention and prostatomegaly.    3.4 L IVF in and  7.6 L UOP since admission = net negative 4.2 L  Assessment/ Plan: AKI - b/l creatinine is 1.0- 1.2, eGFR 72- 88. Creat here 6.64 in setting of fever, ^wbc and possible PNA. UA was unremarkable. Renal US showed very large bladder and prostate w/ bilat hydronephrosis. IVF"s were given and foley catheter placed. Renal function is improving w/ drop in creatinine and excellent UOP. AKI likely due to obstruction related to BPH +/- ACEi effects. Will resume IVF's at 75 cc/ hr for another day or two to assure volume repleted state. Cont to hold acei for now.  AMS - uremia most likely and /or infection. Improving.  CAP - getting IV abx for possible PNA HTN - norvasc/ coreg/ lisinopril/ hygroton are all on hold, BP's good here.       Rob Daniela Hernan  MD 11/16/2021, 8:25 PM Recent Labs  Lab 11/15/21 1224 11/15/21 2023 11/16/21 0050  HGB 11.2*  --  10.8*  ALBUMIN 2.8*  --   --   CALCIUM 8.1* 7.8* 7.6*  CREATININE 6.64* 5.60* 5.06*  K 5.9* 6.2* 4.7   Inpatient medications:  Chlorhexidine Gluconate Cloth  6 each Topical Daily   clopidogrel  75 mg Oral Daily   heparin  5,000 Units Subcutaneous Q8H   insulin aspart  0-5 Units Subcutaneous QHS   [START ON 11/17/2021] insulin aspart  0-9 Units Subcutaneous TID WC   mouth rinse  15 mL Mouth Rinse 4 times per day   sertraline  100 mg Oral Daily    azithromycin 500 mg (11/16/21 1702)   cefTRIAXone (ROCEPHIN)  IV 1 g (11/16/21 1811)   mouth rinse

## 2021-11-16 NOTE — Evaluation (Signed)
Physical Therapy Evaluation Patient Details Name: [name] MRN: [id number] DOB: 10-Jul-1950 Today's Date: 11/16/2021  History of Present Illness  Pt adm 6/16 with acute renal failure and sepsis. Pt also with obstructive uropathy and pleural effusion. PMH - CVA with rt residual weakness, DM, DVT, HTN, PVD, ckd  Clinical Impression  Pt presents to PT with decr mobility due to weakness, decr balance, and decr activity tolerance due to current illness on top of chronic rt sided weakness from prior CVA. Was living alone and mobilizing on his own until this. Currently not able to care for himself independently. Recommend [facility]NF for rehab to assist getting pt back to baseline unless he has some one who could step in and assist him at home with mobility and ADL's.        Recommendations for follow up therapy are one component of a multi-disciplinary discharge planning process, led by the attending physician.  Recommendations may be updated based on patient status, additional functional criteria and insurance authorization.  Follow Up Recommendations Skilled nursing-short term rehab (<3 hours/day)    Assistance Recommended at Discharge Frequent or constant Supervision/Assistance  Patient can return home with the following  A little help with walking and/or transfers;A little help with bathing/dressing/bathroom;Assistance with cooking/housework;Assist for transportation;Help with stairs or ramp for entrance    Equipment Recommendations None recommended by PT  Recommendations for Other Services       Functional Status Assessment Patient has had a recent decline in their functional status and demonstrates the ability to make significant improvements in function in a reasonable and predictable amount of time.     Precautions / Restrictions Precautions Precautions: Fall      Mobility  Bed Mobility Overal bed mobility: Needs Assistance Bed Mobility: Sidelying to Sit   Sidelying to sit:  Min assist, HOB elevated       General bed mobility comments: Assist to get RLE off of bed and elevate trunk into sitting    Transfers Overall transfer level: Needs assistance Equipment used: Rolling walker (2 wheels) Transfers: Sit to/from Stand Sit to Stand: Min assist           General transfer comment: Assist to bring hips up and to place Rt hand on walker    Ambulation/Gait Ambulation/Gait assistance: Min assist Gait Distance (Feet): 10 Feet Assistive device: Rolling walker (2 wheels) Gait Pattern/deviations: Step-to pattern, Decreased step length - right, Decreased step length - left, Decreased dorsiflexion - right Gait velocity: decr Gait velocity interpretation: <1.31 ft/sec, indicative of household ambulator   General Gait Details: Assist for balance and support. Pt with body rotated to rt as stepping with walker resulting in more of a side stepping motion.  Stairs            Wheelchair Mobility    Modified Rankin (Stroke Patients Only)       Balance Overall balance assessment: Needs assistance Sitting-balance support: No upper extremity supported, Feet supported Sitting balance-Leahy Scale: Fair     Standing balance support: Single extremity supported, During functional activity Standing balance-Leahy Scale: Poor Standing balance comment: UE support and min guard for static standing                             Pertinent Vitals/Pain Pain Assessment Pain Assessment: No/denies pain    Home Living Family/patient expects to be discharged to:: Private residence Living Arrangements: Alone Available Help at Discharge: Family;Available PRN/intermittently Type of Home: [place]  Access: Level entry       Home Layout: One level Home Equipment: Cane - quad;BSC/3in1;Tub bench      Prior Function Prior Level of Function : Independent/Modified Independent;History of Falls (last six months)             Mobility Comments: per pt  modified independent using quad cane       Hand Dominance   Dominant Hand: Right    Extremity/Trunk Assessment   Upper Extremity Assessment Upper Extremity Assessment: Defer to OT evaluation    Lower Extremity Assessment Lower Extremity Assessment: Generalized weakness;RLE deficits/detail RLE Deficits / Details: Baseline weakness from prior CVA       Communication   Communication: Expressive difficulties  Cognition Arousal/Alertness: Awake/alert Behavior During Therapy: Flat affect Overall Cognitive Status: No family/caregiver present to determine baseline cognitive functioning                                 General Comments: Follows commands. Some expressive difficulties from prior CVA.        General Comments General comments (skin integrity, edema, etc.): VSS on RA    Exercises     Assessment/Plan    PT Assessment Patient needs continued PT services  PT Problem List Decreased strength;Decreased activity tolerance;Decreased balance;Decreased mobility       PT Treatment Interventions DME instruction;Gait training;Functional mobility training;Therapeutic activities;Therapeutic exercise;Balance training;Neuromuscular re-education;Patient/family education    PT Goals (Current goals can be found in the Care Plan section)  Acute Rehab PT Goals Patient Stated Goal: not stated PT Goal Formulation: With patient Time For Goal Achievement: 11/30/21 Potential to Achieve Goals: Good    Frequency Min 3X/week     Co-evaluation               AM-PAC PT "6 Clicks" Mobility  Outcome Measure Help needed turning from your back to your side while in a flat bed without using bedrails?: A Little Help needed moving from lying on your back to sitting on the side of a flat bed without using bedrails?: A Little Help needed moving to and from a bed to a chair (including a wheelchair)?: A Little Help needed standing up from a chair using your arms (e.g.,  wheelchair or bedside chair)?: A Little Help needed to walk in hospital room?: Total Help needed climbing 3-5 steps with a railing? : Total 6 Click Score: 14    End of Session Equipment Utilized During Treatment: Gait belt Activity Tolerance: Patient limited by fatigue Patient left: in chair;with call bell/phone within reach;with chair alarm set Nurse Communication: Mobility status;Other (comment) (IV from rt hand out upon entry) PT Visit Diagnosis: Unsteadiness on feet (R26.81);Other abnormalities of gait and mobility (R26.89);History of falling (Z91.81)    Time: 5361-4431 PT Time Calculation (min) (ACUTE ONLY): 16 min   Charges:   PT Evaluation $PT Eval Moderate Complexity: 1 [place] [facility] [place] 11/16/2021, 2:35 PM

## 2021-11-16 NOTE — Progress Notes (Addendum)
Bladder Scan 0 ml

## 2021-11-16 NOTE — Progress Notes (Signed)
FPTS Brief Progress Note  S Saw patient at bedside this evening. Patient was sleeping comfortably. I did not wake the patient.  O: BP (!) 142/59 (BP Location: Left Arm)   Pulse 64   Temp 99.8 F (37.7 C) (Oral)   Resp 11   Ht '5\' 7"'$  (1.702 m)   Wt 96.2 kg   SpO2 96%   BMI 33.22 kg/m    General: sleeping comfortably   A/P: Plan per day team   Lattie Haw, MD 11/16/2021, 10:29 PM PGY-3, [facility] Night Resident  Please page [phone] with questions.

## 2021-11-16 NOTE — Progress Notes (Signed)
  Echocardiogram 2D Echocardiogram has been performed.  [name] 11/16/2021, 10:20 AM

## 2021-11-16 NOTE — Evaluation (Signed)
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   Diabetes mellitus    DVT (deep venous thrombosis) (H[facility])    Hypertension    PAD (peripheral artery disease) ([place])    Stroke ([place])    2008   Past Surgical History:  Past Surgical History:  Procedure Laterality Date   COLONOSCOPY     None     HPI:  Patient is a 71 y.o. male with PMH: CVA (2018) with residual right sided weakness and dysarthria, DM-2, DVT, HTN, PVD and acute chronic kidney rejection. He presented to the hospital on 11/15/21 from home after family found him in feces, urine and vomit. At baseline he lives by himself but per patient report, has had four days of weakness to the point that he was unable to get out of bed to use bathroom, progressive worsening in swallowing with both solids and liquids as well as choking and vomiting undigested food at times. UA WNL, UDS negative, CXR showed opacity in right lung base for which PNA could not be excluded, however patient without respiratory symptoms. CT head negative for acute intracranial abnormality. Patient admitted for sepsis with acute renal failure. He as made NPO awaiting SLP evaluation of swallow function.    Assessment / Plan / Recommendation  Clinical Impression  Patient presents with what appears to be a primary oral dysphagia as per this clinical/bedside swallow evaluation. No overt s/s of aspiration or penetration observed and swallow initiation appeared timely but cannot r/o silent aspiration and/or pharyngeal phase dysphagia. He  demonstrated mild delay in masticaiton and oral transit with regular solids.  When asked after swallows, patient reported that he wasn't noticing the symptoms of dysphagia he had reported upon admission. Patient himself reported difficulties lately with swallowing, describing that he was having trouble getting water "to go down" and that he would have delays but "eventually it would go down". He also reported h/o dysphagia but when reviewing notes, SLP only found h/o speech and cognitive assessment and treatment from 2018 CVA and no reported dysphagia. SLP is recommending to start Dys 3 (mechanical soft) solids and thin liquids and plan to follow up to ensure diet toleration. SLP Visit Diagnosis: Dysphagia, unspecified (R13.10)    Aspiration Risk  No limitations;Mild aspiration risk    Diet Recommendation Dysphagia 3 (Mech soft);Thin liquid   Liquid Administration via: Cup;Straw Medication Administration: Whole meds with liquid Supervision: Patient able to self feed;Staff to assist with self feeding;Full supervision/cueing for compensatory strategies Compensations: Minimize environmental distractions;Slow rate;Small sips/bites;Lingual sweep for clearance of pocketing Postural Changes: Seated upright at 90 degrees    Other  Recommendations Oral Care Recommendations: Oral care BID    Recommendations for follow up therapy are one component of a multi-disciplinary discharge planning process, led by the attending physician.  Recommendations may be updated based on patient status, additional functional criteria and insurance authorization.  Follow up Recommendations Other (comment) (TBD)      Assistance Recommended at Discharge Set up Supervision/Assistance  Functional Status Assessment Patient has had a recent decline in their functional status  and demonstrates the ability to make significant improvements in function in a reasonable and predictable amount of time.  Frequency and Duration min 1  x/week  1 week       Prognosis Prognosis for Safe Diet Advancement: Good      Swallow Study   General Date of Onset: 11/15/21 HPI: Patient is a 71 y.o. male with PMH: CVA (2018) with residual right sided weakness and dysarthria, DM-2, DVT, HTN, PVD and acute chronic kidney rejection. He presented to the hospital on 11/15/21 from home after family found him in feces, urine and vomit. At baseline he lives by himself but per patient report, has had four days of weakness to the point that he was unable to get out of bed to use bathroom, progressive worsening in swallowing with both solids and liquids as well as choking and vomiting undigested food at times. UA WNL, UDS negative, CXR showed opacity in right lung base for which PNA could not be excluded, however patient without respiratory symptoms. CT head negative for acute intracranial abnormality. Patient admitted for sepsis with acute renal failure. He as made NPO awaiting SLP evaluation of swallow function. Type of Study: Bedside Swallow Evaluation Previous Swallow Assessment: none found Diet Prior to this Study: NPO Temperature Spikes Noted: No Respiratory Status: Room air History of Recent Intubation: No Behavior/Cognition: Alert;Cooperative;Pleasant mood;Lethargic/Drowsy Oral Cavity Assessment: Within Functional Limits Oral Care Completed by SLP: Yes Oral Cavity - Dentition: Adequate natural dentition;Missing dentition Self-Feeding Abilities: Needs assist;Needs set up;Able to feed self Patient Positioning: Upright in bed Baseline Vocal Quality: Normal Volitional Cough: Strong Volitional Swallow: Able to elicit    Oral/Motor/Sensory Function Overall Oral Motor/Sensory Function: Mild impairment Facial ROM: Reduced right;Suspected CN VII (facial) dysfunction Facial Symmetry: Abnormal symmetry right;Suspected CN VII (facial) dysfunction Facial Strength: Reduced right;Suspected CN VII (facial) dysfunction Lingual Strength: Reduced   Ice  Chips     Thin Liquid Thin Liquid: Within functional limits Presentation: Straw    Nectar Thick     Honey Thick     Puree Puree: Within functional limits Presentation: Spoon   Solid     Solid: Impaired Oral Phase Impairments: Impaired mastication Oral Phase Functional Implications: Prolonged oral transit;Impaired mastication     Sonia Baller, MA, CCC-SLP Speech Therapy

## 2021-11-16 NOTE — Progress Notes (Signed)
Daily Progress Note Intern Pager: [phone]  Patient name: [name] Medical record number: [id number] Date of birth: [birth year] Age: 71 y.o. Gender: male  Primary Care Provider: Donney Dice, DO Consultants: Nephrology Code Status: FULL  Pt Overview and Major Events to Date:  6/16 - admitted, started on ceftriaxone and azithromycin for empiric CAP treatment, Foley catheter placed for urinary retention 6/17 - nephrology consulted  Assessment and Plan:  71 year old male admitted for sepsis possibly secondary to CAP and AKI superimposed on CKD stage II. Pertinent PMH/PSH includes CKD stage II, T2DM, HTN.   * Acute renal failure superimposed on stage 2 chronic kidney disease ([place]) Suspect multifactorial from poor p.o. intake secondary to dysphagia, acute urinary retention, possible underlying infection.  Hyperkalemia now resolved with Lokelma.  Creatinine is improving. - nephrology consulted, appreciate recommendations - Foley in place - Avoid nephrotoxic agents - Strict I's and O's - Daily weights - monitor on labs - SLP following for dysphagia  Sepsis due to undetermined organism, with acute renal failure ([place]) Unclear how much infection is contributing to overall picture.  He has had no signs to suggest pneumonia other than leukocytosis and possible infiltrate on imaging.  We will continue empiric CAP treatment for now. - continue IV ceftriaxone, azithromycin - f/u blood cultures  Obstructive uropathy Mild bilateral hydronephrosis seen on renal ultrasound. - Foley in place - Consider tamsulosin  Metabolic acidosis, increased anion gap Likely secondary to lactic acidosis and AKI.  Question metformin contributing.  Salicylate and ethanol levels normal.  Lactate improving with IV fluids which are currently discontinued. - check VBG   Pleural effusion Hypervolemic on exam, consider underlying CHF.  Last TTE in 2018 was unremarkable. - f/u TTE  Essential  hypertension Low DBP while holding home antihypertensives. - continue to hold home carvedilol, amlodipine, lisinopril, and chlorthalidone (reported not taking)   Other problems chronic and stable: Depression - resumed sertraline   FEN/GI: Dysphagia 3 PPx: started subcutaneous heparin Dispo: pending medical workup.   Subjective:  Foley catheter placed overnight.  This morning, patient reports that he feels better overall.  Denies cough, chest pain, shortness of breath, fever, chills.  Objective: Temp:  [98.1 F (36.7 C)-99.3 F (37.4 C)] 99.3 F (37.4 C) (06/17 0752) Pulse Rate:  [58-71] 68 (06/17 0752) Resp:  [15-26] 15 (06/17 0752) BP: (119-156)/(46-66) 129/49 (06/17 0752) SpO2:  [96 %-100 %] 99 % (06/17 0752) Weight:  [96.2 kg-98.2 kg] 96.2 kg (06/17 0303) Physical Exam: General: Alert, NAD Cardiovascular: RRR, 3/6 systolic murmur Respiratory: no respiratory distress on room air Abdomen: Soft, nontender, positive bowel sounds Extremities: Warm and well perfused, 2+ pitting edema bilateral lower extremities up to proximal shins, trace edema in upper extremities  Laboratory: Most recent CBC Lab Results  Component Value Date   WBC 12.5 (H) 11/16/2021   HGB 10.8 (L) 11/16/2021   HCT 32.8 (L) 11/16/2021   MCV 89.1 11/16/2021   PLT 132 (L) 11/16/2021   Most recent BMP    Latest Ref Rng & Units 11/16/2021   12:50 AM  BMP  Glucose 70 - 99 mg/dL 181   BUN 8 - 23 mg/dL 76   Creatinine 0.61 - 1.24 mg/dL 5.06   Sodium 135 - 145 mmol/L 134   Potassium 3.5 - 5.1 mmol/L 4.7   Chloride 98 - 111 mmol/L 105   CO2 22 - 32 mmol/L 17   Calcium 8.9 - 10.3 mg/dL 7.6       Imaging/Diagnostic Tests: No  new imaging  Zola Button, MD 11/16/2021, 9:50 AM  PGY-2, [place] Intern pager: [phone], text pages welcome Secure chat group [place]

## 2021-11-16 NOTE — Progress Notes (Signed)
Patient arrived via bed alert and orin. Orin. To room call bell in reach . Tim R.N. into assess patient. No complaints voiced.

## 2021-11-16 NOTE — Assessment & Plan Note (Deleted)
A1c this admission 8.1.  - hold home metformin - sensitive SSI - CBG qAC and qHS

## 2021-11-17 ENCOUNTER — Encounter (HOSPITAL_COMMUNITY): Payer: Self-pay | Admitting: Family Medicine

## 2021-11-17 DIAGNOSIS — J189 Pneumonia, unspecified organism: Secondary | ICD-10-CM | POA: Insufficient documentation

## 2021-11-17 DIAGNOSIS — N1330 Unspecified hydronephrosis: Secondary | ICD-10-CM

## 2021-11-17 DIAGNOSIS — E875 Hyperkalemia: Secondary | ICD-10-CM | POA: Diagnosis not present

## 2021-11-17 DIAGNOSIS — I10 Essential (primary) hypertension: Secondary | ICD-10-CM

## 2021-11-17 DIAGNOSIS — E1159 Type 2 diabetes mellitus with other circulatory complications: Secondary | ICD-10-CM | POA: Diagnosis not present

## 2021-11-17 DIAGNOSIS — N139 Obstructive and reflux uropathy, unspecified: Secondary | ICD-10-CM

## 2021-11-17 DIAGNOSIS — N179 Acute kidney failure, unspecified: Secondary | ICD-10-CM | POA: Diagnosis not present

## 2021-11-17 LAB — CBC
HCT: 30.4 % — ABNORMAL LOW (ref 39.0–52.0)
Hemoglobin: 9.8 g/dL — ABNORMAL LOW (ref 13.0–17.0)
MCH: 28.7 pg (ref 26.0–34.0)
MCHC: 32.2 g/dL (ref 30.0–36.0)
MCV: 89.1 fL (ref 80.0–100.0)
Platelets: 139 10*3/uL — ABNORMAL LOW (ref 150–400)
RBC: 3.41 MIL/uL — ABNORMAL LOW (ref 4.22–5.81)
RDW: 13.7 % (ref 11.5–15.5)
WBC: 8.9 10*3/uL (ref 4.0–10.5)
nRBC: 0 % (ref 0.0–0.2)

## 2021-11-17 LAB — RENAL FUNCTION PANEL
Albumin: 2.1 g/dL — ABNORMAL LOW (ref 3.5–5.0)
Anion gap: 8 (ref 5–15)
BUN: 42 mg/dL — ABNORMAL HIGH (ref 8–23)
CO2: 20 mmol/L — ABNORMAL LOW (ref 22–32)
Calcium: 7.5 mg/dL — ABNORMAL LOW (ref 8.9–10.3)
Chloride: 108 mmol/L (ref 98–111)
Creatinine, Ser: 2.04 mg/dL — ABNORMAL HIGH (ref 0.61–1.24)
GFR, Estimated: 34 mL/min — ABNORMAL LOW (ref >60)
Glucose, Bld: 186 mg/dL — ABNORMAL HIGH (ref 70–99)
Phosphorus: 2.5 mg/dL (ref 2.5–4.6)
Potassium: 3.8 mmol/L (ref 3.5–5.1)
Sodium: 136 mmol/L (ref 135–145)

## 2021-11-17 LAB — GLUCOSE, CAPILLARY
Glucose-Capillary: 148 mg/dL — ABNORMAL HIGH (ref 70–99)
Glucose-Capillary: 295 mg/dL — ABNORMAL HIGH (ref 70–99)
Glucose-Capillary: 173 mg/dL — ABNORMAL HIGH (ref 70–99)
Glucose-Capillary: 261 mg/dL — ABNORMAL HIGH (ref 70–99)

## 2021-11-17 MED ORDER — TAMSULOSIN HCL 0.4 MG PO CAPS
0.4000 mg | ORAL_CAPSULE | Freq: Every day | ORAL | Status: DC
  Administered 2021-11-17 – 2021-11-19 (×3): 0.4 mg via ORAL
  Filled 2021-11-17 (×2): qty 1

## 2021-11-17 NOTE — NC FL2 (Signed)
[place] LEVEL OF CARE SCREENING TOOL     IDENTIFICATION  Patient Name: [name] Birthdate: 24-Feb-1951 Sex: male Admission Date (Current Location): 11/15/2021  [place] and [place] Number:  Herbalist and Address:  [facility] [place], [place] [address], [place], [place] [address]      Provider Number: [id number]  Attending Physician Name and Address:  Kinnie Feil, MD  Relative Name and Phone Number:  Laurian Brim ([phone]    Current Level of Care: Hospital Recommended Level of Care: [place] Prior Approval Number:    Date Approved/Denied:   PASRR Number: [id number]  Discharge Plan: SNF    Current Diagnoses: Patient Active Problem List   Diagnosis Date Noted   CAP (community acquired pneumonia) 11/17/2021   Hydronephrosis    Acute hyperkalemia    Obstructive uropathy 60/73/7106   Metabolic acidosis, increased anion gap 11/15/2021   Pleural effusion 11/15/2021   Acute renal failure (ARF) ([place]) 11/15/2021   Pain due to onychomycosis of toenails of both feet 12/19/2020   Physical deconditioning 11/27/2020   Dysequilibrium 11/27/2020   Spastic hemiplegia of right dominant side due to cerebrovascular disease ([place]) 11/03/2017   Muscle wasting 09/25/2017   Depression 07/14/2017   Dysarthria as late effect of stroke 08/18/2016   Stage 2 chronic kidney disease    Leukocytosis    Essential hypertension    Spastic hemiparesis ([place])    Aphasia due to recent cerebral infarction 07/04/2016   Cerebrovascular accident (CVA) ([place])    Altered mental status 06/30/2016   Fall 06/30/2016   Acute encephalopathy 06/30/2016   Diarrhea 06/30/2016   Acute renal failure superimposed on stage 2 chronic kidney disease ([place]) 06/30/2016   Hemiparesis affecting right side as late effect of stroke ([place]) 10/23/2015   PAD (peripheral artery disease) ([place]) 10/20/2011   Onychomycosis of great toe 09/10/2011    Hyperlipidemia 02/21/2010   CEREBROVASCULAR ACCIDENT, HX OF 12/22/2008   Diabetes mellitus, type II ([place]) 07/30/2006   OBESITY, NOS 07/30/2006   Essential hypertension, benign 07/30/2006    Orientation RESPIRATION BLADDER Height & Weight     Self, Time, Situation, Place (WDL)  Normal Continent (Urethral Catheter Double Lumen 16 Fr.) Weight: 212 lb 1.3 oz (96.2 kg) Height:  '5\' 7"'$  (170.2 cm)  BEHAVIORAL SYMPTOMS/MOOD NEUROLOGICAL BOWEL NUTRITION STATUS      Incontinent Diet (Please see discharge summary)  AMBULATORY STATUS COMMUNICATION OF NEEDS Skin   Limited Assist Verbally Other (Comment) (Appropriate for ethnicity,dry,intact,wound incision open or dehiced, Irritant dermatitis,MASD,Buttocks mid stage 2,tiny pink open area)                       Personal Care Assistance Level of Assistance  Bathing, Feeding, Dressing Bathing Assistance: Limited assistance Feeding assistance: Independent (Able to feed self, Needs assist with set up) Dressing Assistance: Limited assistance     Functional Limitations Info  Sight, Hearing, Speech Sight Info: Impaired Hearing Info: Adequate Speech Info: Adequate    SPECIAL CARE FACTORS FREQUENCY  PT (By licensed PT), OT (By licensed OT)     PT Frequency: 5x min weekly OT Frequency: 5x min weekly            Contractures Contractures Info: Not present    Additional Factors Info  Code Status, Allergies, Insulin Sliding Scale, Psychotropic Code Status Info: FULL Allergies Info: Pork Allergy,Shrimp Flavor Psychotropic Info: sertraline (ZOLOFT) tablet 100 mg daily Insulin Sliding Scale Info: insulin aspart (novoLOG) injection  0-5 Units daily at bedtime,insulin aspart (novoLOG) injection 0-9 Units 3 times daily with meals       Current Medications (11/17/2021):  This is the current hospital active medication list Current Facility-Administered Medications  Medication Dose Route Frequency Provider Last Rate Last Admin   0.9 %  sodium  chloride infusion   Intravenous Continuous Wells Guiles, DO 10 mL/hr at 11/17/21 1028 Rate Change at 11/17/21 1028   Chlorhexidine Gluconate Cloth 2 % PADS 6 each  6 each Topical Daily Kinnie Feil, MD   6 each at 11/16/21 1659   clopidogrel (PLAVIX) tablet 75 mg  75 mg Oral Daily Zola Button, MD   75 mg at 11/17/21 1027   heparin injection 5,000 Units  5,000 Units Subcutaneous Q8H Zola Button, MD   5,000 Units at 11/17/21 8250   insulin aspart (novoLOG) injection 0-5 Units  0-5 Units Subcutaneous QHS Zola Button, MD   2 Units at 11/16/21 2203   insulin aspart (novoLOG) injection 0-9 Units  0-9 Units Subcutaneous TID WC Zola Button, MD       Oral care mouth rinse  15 mL Mouth Rinse 4 times per day Andrena Mews T, MD   15 mL at 11/16/21 2138   sertraline (ZOLOFT) tablet 100 mg  100 mg Oral Daily Zola Button, MD   100 mg at 11/17/21 1027   tamsulosin (FLOMAX) capsule 0.4 mg  0.4 mg Oral Daily Wells Guiles, DO         Discharge Medications: Please see discharge summary for a list of discharge medications.  Relevant Imaging Results:  Relevant Lab Results:   Additional Information [phone], Both Covid Vaccines 1 Booster  Milas Gain, LCSWA

## 2021-11-17 NOTE — TOC Initial Note (Signed)
Transition of Care [facility]) - Initial/Assessment Note    Patient Details  Name: [name] MRN: [id number] Date of Birth: 1951-01-01  Transition of Care [facility]) CM/SW Contact:    Milas Gain, [place] Phone Number: 11/17/2021, 11:29 AM  Clinical Narrative:                  CSW received consult for possible SNF placement at time of discharge. CSW spoke with patient regarding PT recommendation of SNF placement at time of discharge.  Patient expressed understanding of PT recommendation and is agreeable to SNF placement at time of discharge. Patient gave CSW permission to fax out initial referral near the [place] area.CSW discussed insurance authorization process with patient. Patient reports he has received the COVID vaccines as well as 1 booster. Patient request for CSW to follow up with his daughter Janace Hoard when SNF bed offers come in.No further questions reported at this time. CSW to continue to follow and assist with discharge planning needs.   Expected Discharge Plan: Skilled Nursing Facility Barriers to Discharge: Continued Medical Work up   Patient Goals and CMS Choice Patient states their goals for this hospitalization and ongoing recovery are:: SNF CMS Medicare.gov Compare Post Acute Care list provided to:: Patient Choice offered to / list presented to : Patient  Expected Discharge Plan and Services Expected Discharge Plan: [place] In-house Referral: Clinical Social Work     Living arrangements for the past 2 months: [place]                                      Prior Living Arrangements/Services Living arrangements for the past 2 months: Single Family Home Lives with:: Self Patient language and need for interpreter reviewed:: Yes Do you feel safe going back to the place where you live?: No   SNF  Need for Family Participation in Patient Care: Yes (Comment) Care giver support system in place?: Yes (comment)   Criminal Activity/Legal  Involvement Pertinent to Current Situation/Hospitalization: No - Comment as needed  Activities of Daily Living Home Assistive Devices/Equipment: CBG Meter, Walker (specify type), Cane (specify quad or straight), Built-in shower seat ADL Screening (condition at time of admission) Patient's cognitive ability adequate to safely complete daily activities?: Yes Is the patient deaf or have difficulty hearing?: No Does the patient have difficulty seeing, even when wearing glasses/contacts?: No Does the patient have difficulty concentrating, remembering, or making decisions?: Yes Patient able to express need for assistance with ADLs?: Yes Does the patient have difficulty dressing or bathing?: Yes Independently performs ADLs?: No Communication: Independent Dressing (OT): Needs assistance Grooming: Needs assistance Feeding: Needs assistance Bathing: Needs assistance Toileting: Needs assistance In/Out Bed: Needs assistance Walks in Home: Needs assistance Does the patient have difficulty walking or climbing stairs?: Yes Weakness of Legs: Both Weakness of Arms/Hands: Right  Permission Sought/Granted Permission sought to share information with : Case Manager, Family Supports, Customer service manager Permission granted to share information with : Yes, Verbal Permission Granted  Share Information with NAME: [name]  Permission granted to share info w AGENCY: SNF  Permission granted to share info w Relationship: daughter  Permission granted to share info w Contact Information: Janace Hoard [phone]  Emotional Assessment   Attitude/Demeanor/Rapport: Gracious Affect (typically observed): Calm Orientation: : Oriented to Self, Oriented to Place, Oriented to  Time, Oriented to Situation (WDL) Alcohol / Substance Use: Not Applicable Psych Involvement: No (comment)  Admission diagnosis:  Acute hyperkalemia [E87.5] Acute renal failure (ARF) ([place]) [N17.9] Hydronephrosis, unspecified hydronephrosis  type [N13.30] Acute renal failure, unspecified acute renal failure type ([place]) [N17.9] Patient Active Problem List   Diagnosis Date Noted   CAP (community acquired pneumonia) 11/17/2021   Hydronephrosis    Acute hyperkalemia    Obstructive uropathy 78/58/8502   Metabolic acidosis, increased anion gap 11/15/2021   Pleural effusion 11/15/2021   Acute renal failure (ARF) ([place]) 11/15/2021   Pain due to onychomycosis of toenails of both feet 12/19/2020   Physical deconditioning 11/27/2020   Dysequilibrium 11/27/2020   Spastic hemiplegia of right dominant side due to cerebrovascular disease ([place]) 11/03/2017   Muscle wasting 09/25/2017   Depression 07/14/2017   Dysarthria as late effect of stroke 08/18/2016   Stage 2 chronic kidney disease    Leukocytosis    Essential hypertension    Spastic hemiparesis (HCC)    Aphasia due to recent cerebral infarction 07/04/2016   Cerebrovascular accident (CVA) ([place])    Altered mental status 06/30/2016   Fall 06/30/2016   Acute encephalopathy 06/30/2016   Diarrhea 06/30/2016   Acute renal failure superimposed on stage 2 chronic kidney disease ([place]) 06/30/2016   Hemiparesis affecting right side as late effect of stroke ([place]) 10/23/2015   PAD (peripheral artery disease) ([place]) 10/20/2011   Onychomycosis of great toe 09/10/2011   Hyperlipidemia 02/21/2010   CEREBROVASCULAR ACCIDENT, HX OF 12/22/2008   Diabetes mellitus, type II ([place]) 07/30/2006   OBESITY, NOS 07/30/2006   Essential hypertension, benign 07/30/2006   PCP:  Donney Dice, DO Pharmacy:   [id number] [place] [address] [id number]: [phone]: [phone]    Social Determinants of Health (SDOH) Interventions    Readmission Risk Interventions     No data to display

## 2021-11-17 NOTE — Assessment & Plan Note (Deleted)
Remains afebrile, leukocytosis resolved. Asymptomatic. - d/c ceftriaxone and azithromycin

## 2021-11-17 NOTE — Evaluation (Signed)
Occupational Therapy Evaluation Patient Details Name: [name] MRN: [id number] DOB: 1950-12-31 Today's Date: 11/17/2021   History of Present Illness Pt adm 6/16 with acute renal failure and sepsis. Pt also with obstructive uropathy and pleural effusion. PMH - CVA with rt residual weakness, DM, DVT, HTN, PVD, ckd   Clinical Impression   Pt admitted with renal failure and sepsis. Pt currently with functional limitations due to the deficits listed below (see OT Problem List).  Pt will benefit from skilled OT to increase their safety and independence with ADL and functional mobility for ADL to facilitate discharge to venue listed below.        Recommendations for follow up therapy are one component of a multi-disciplinary discharge planning process, led by the attending physician.  Recommendations may be updated based on patient status, additional functional criteria and insurance authorization.   Follow Up Recommendations  Skilled nursing-short term rehab (<3 hours/day)    Assistance Recommended at Discharge Frequent or constant Supervision/Assistance  Patient can return home with the following A little help with walking and/or transfers;A little help with bathing/dressing/bathroom    Functional Status Assessment  Patient has had a recent decline in their functional status and demonstrates the ability to make significant improvements in function in a reasonable and predictable amount of time.  Equipment Recommendations  None recommended by OT       Precautions / Restrictions Restrictions Weight Bearing Restrictions: Yes RUE Weight Bearing: Weight bearing as tolerated RLE Weight Bearing: Weight bearing as tolerated      Mobility Bed Mobility Overal bed mobility: Needs Assistance Bed Mobility: Supine to Sit     Supine to sit: Mod assist          Transfers Overall transfer level: Needs assistance Equipment used: 1 person hand held assist Transfers: Sit to/from  Stand Sit to Stand: Mod assist                  Balance Overall balance assessment: Needs assistance Sitting-balance support: No upper extremity supported, Feet supported Sitting balance-Leahy Scale: Fair     Standing balance support: Single extremity supported, During functional activity Standing balance-Leahy Scale: Poor Standing balance comment: UE support and min guard for static standing                           ADL either performed or assessed with clinical judgement   ADL Overall ADL's : Needs assistance/impaired Eating/Feeding: Minimal assistance;Sitting   Grooming: Minimal assistance;Sitting   Upper Body Bathing: Moderate assistance;Sitting   Lower Body Bathing: +2 for physical assistance;Maximal assistance;Sit to/from stand;Sitting/lateral leans   Upper Body Dressing : Minimal assistance;Sitting   Lower Body Dressing: Maximal assistance;+2 for physical assistance;+2 for safety/equipment;Sit to/from stand   Toilet Transfer: Moderate assistance;+2 for safety/equipment;BSC/3in1   Toileting- Clothing Manipulation and Hygiene: Maximal assistance;Sit to/from stand;+2 for safety/equipment       Functional mobility during ADLs: +2 for safety/equipment;Cueing for sequencing;Cueing for safety;Moderate assistance       Vision Patient Visual Report: Blurring of vision              Pertinent Vitals/Pain Pain Assessment Pain Assessment: No/denies pain     Hand Dominance Right   Extremity/Trunk Assessment Upper Extremity Assessment Upper Extremity Assessment: RUE deficits/detail RUE Deficits / Details: pt with decreased use of RUE. No functional movement noted. Increased tone noted.  Unable to perform full PROM. Pt states he does not work with [place]  Extremity Assessment RLE Deficits / Details: Baseline weakness from prior CVA       Communication Communication Communication: Expressive difficulties   Cognition Arousal/Alertness:  Awake/alert Behavior During Therapy: WFL for tasks assessed/performed Overall Cognitive Status: Within Functional Limits for tasks assessed                                 General Comments: Some expressive difficulties from prior CVA.     General Comments               Home Living Family/patient expects to be discharged to:: Skilled nursing facility Living Arrangements: Alone Available Help at Discharge: Family;Available PRN/intermittently Type of Home: House Home Access: Level entry     Home Layout: One level     Bathroom Shower/Tub: Teacher, early years/pre: Standard Bathroom Accessibility: Yes   Home Equipment: Cane - quad;BSC/3in1;Tub bench          Prior Functioning/Environment Prior Level of Function : Independent/Modified Independent;History of Falls (last six months)             Mobility Comments: per pt modified independent using quad cane          OT Problem List: Decreased strength;Impaired balance (sitting and/or standing);Decreased range of motion;Impaired UE functional use;Impaired vision/perception      OT Treatment/Interventions: Self-care/ADL training;DME and/or AE instruction;Splinting    OT Goals(Current goals can be found in the care plan section) Acute Rehab OT Goals Patient Stated Goal: get stronger OT Goal Formulation: With patient Time For Goal Achievement: 12/01/21 Potential to Achieve Goals: Good ADL Goals Pt Will Perform Eating: with set-up;sitting Pt Will Perform Grooming: with set-up;sitting Pt Will Perform Upper Body Bathing: with supervision;sitting Pt Will Transfer to Toilet: with min guard assist;bedside commode Pt Will Perform Toileting - Clothing Manipulation and hygiene: with min assist;sit to/from stand;sitting/lateral leans  OT Frequency: Min 2X/week       AM-PAC OT "6 Clicks" Daily Activity     Outcome Measure Help from another person eating meals?: A Little Help from another person  taking care of personal grooming?: A Little Help from another person toileting, which includes using toliet, bedpan, or urinal?: A Lot Help from another person bathing (including washing, rinsing, drying)?: A Lot Help from another person to put on and taking off regular upper body clothing?: A Little Help from another person to put on and taking off regular lower body clothing?: A Lot 6 Click Score: 15   End of Session Nurse Communication: Mobility status  Activity Tolerance: Patient tolerated treatment well Patient left: in chair;with call bell/phone within reach;with chair alarm set  OT Visit Diagnosis: Unsteadiness on feet (R26.81);Muscle weakness (generalized) (M62.81);History of falling (Z91.81);Other abnormalities of gait and mobility (R26.89);Hemiplegia and hemiparesis Hemiplegia - Right/Left: Right Hemiplegia - dominant/non-dominant: Dominant Hemiplegia - caused by: Cerebral infarction                Time: 1430-1515 OT Time Calculation (min): 45 min Charges:  OT General Charges $OT Visit: 1 Visit OT Evaluation $OT Eval Moderate Complexity: 1 Mod OT Treatments $Self Care/Home Management : 8-22 mins Kari Baars, [place]  [phone]      Payton Mccallum D 11/17/2021, 3:37 PM

## 2021-11-17 NOTE — Progress Notes (Signed)
Daily Progress Note Intern Pager: [phone]  Patient name: [name] Medical record number: [id number] Date of birth: 11-25-1950 Age: 71 y.o. Gender: male  Primary Care Provider: Donney Dice, DO Consultants: Nephro (s/o 6/18) Code Status: Full  Pt Overview and Major Events to Date:  6/16: Admitted; Foley catheter placed 6/17: Nephro consulted  Assessment and Plan:  [name] is a 71 year old male admitted with AKI and AMS. Pertinent PMH/PSH includes prior CVA, T2DM, HTN, DVT, PVD, CKD II, HFpEF.   * Acute renal failure superimposed on stage 2 chronic kidney disease ([place]) Postobstructive secondary to urinary retention.  Improving rapidly. Cr 2.04 this morning (from 6.64 on admission) -Nephro signed off, appreciate their recommendations -Continue Foley catheter  -Strict I's and O's -Avoid nephrotoxic agents -Daily BMP  Obstructive uropathy Secondary to prostatomegaly. -Start Flomax 0.'4mg'$  daily -Continue Foley catheter -Likely void trial tomorrow -Recommend outpatient urology f/u  CAP (community acquired pneumonia) Remains afebrile, leukocytosis resolved. Asymptomatic. - d/c ceftriaxone and azithromycin  Essential hypertension Systolic BP minimally elevated to 140-150s. Diastolic on low end (61-60V). - KVO fluids - monitor BP - holding home carvedilol, amlodipine - holding home lisinopril and chlorthalidone (reported not taking) due to AKI  Diabetes mellitus, type II (HCC) A1c this admission 8.1.  - hold home metformin - sensitive SSI - CBG qAC and qHS    FEN/GI: DYS 3 diet PPx: Heparin Dispo:Home in 2-3 days pending clinical improvement.  Subjective:  No acute events overnight. Patient denies complaints this morning. Reports he's feeling much stronger than he did on admission.   Objective: Temp:  [98.5 F (36.9 C)-99.9 F (37.7 C)] 99.9 F (37.7 C) (06/18 0805) Pulse Rate:  [62-76] 76 (06/18 0805) Resp:  [11-21] 21 (06/18 0805) BP:  (137-151)/(57-62) 151/62 (06/18 0805) SpO2:  [96 %-99 %] 97 % (06/18 0805) Physical Exam: General: alert, NAD Cardiovascular: RRR, normal S1/S2 Respiratory: normal effort, lungs CTAB Abdomen: soft, nontender, nondistended Extremities: 1-2+ pitting edema RLE Neuro: oriented x4, speech at baseline  Laboratory: Most recent CBC Lab Results  Component Value Date   WBC 8.9 11/17/2021   HGB 9.8 (L) 11/17/2021   HCT 30.4 (L) 11/17/2021   MCV 89.1 11/17/2021   PLT 139 (L) 11/17/2021   Most recent BMP    Latest Ref Rng & Units 11/17/2021    2:27 AM  BMP  Glucose 70 - 99 mg/dL 186   BUN 8 - 23 mg/dL 42   Creatinine 0.61 - 1.24 mg/dL 2.04   Sodium 135 - 145 mmol/L 136   Potassium 3.5 - 5.1 mmol/L 3.8   Chloride 98 - 111 mmol/L 108   CO2 22 - 32 mmol/L 20   Calcium 8.9 - 10.3 mg/dL 7.5     Other pertinent labs: none  Imaging/Diagnostic Tests: ECHOCARDIOGRAM COMPLETE Result Date: 11/16/2021 IMPRESSIONS  1. Mild intracavitary gradient. Peak velocity 1.6 m/s. Peak gradient 10 mmHg. Left ventricular ejection fraction, by estimation, is 70 to 75%. The left ventricle has hyperdynamic function. The left ventricle has no regional wall motion abnormalities. Left ventricular diastolic parameters are consistent with Grade I diastolic dysfunction (impaired relaxation). Elevated left ventricular end-diastolic pressure.  2. Right ventricular systolic function is normal. The right ventricular size is normal. There is normal pulmonary artery systolic pressure.  3. Left atrial size was severely dilated.  4. The mitral valve is normal in structure. No evidence of mitral valve regurgitation. No evidence of mitral stenosis.  5. The aortic valve is tricuspid. Aortic valve  regurgitation is trivial. No aortic stenosis is present.  6. The inferior vena cava is normal in size with greater than 50% respiratory variability, suggesting right atrial pressure of 3 mmHg.  Electronically signed by Skeet Latch MD  Signature Date/Time: 11/16/2021/1:22:12 PM    Final      Alcus Dad, MD 11/17/2021, 11:12 AM  PGY-2, [place] Intern pager: [phone], text pages welcome Secure chat group [place]

## 2021-11-17 NOTE — Progress Notes (Signed)
[name] Progress Note  Subjective: 4.7 L UOP yesterday and 1400 cc UOP overnight. BP's are good.   Vitals:   11/16/21 1106 11/16/21 1626 11/16/21 2020 11/17/21 0456  BP: (!) 136/49 (!) 137/57 (!) 142/59   Pulse: 64 62 64   Resp: 17 17 11 18   Temp: 98.7 F (37.1 C) 98.5 F (36.9 C) 99.8 F (37.7 C) 99.9 F (37.7 C)  TempSrc: Oral Oral Oral Oral  SpO2: 99% 99% 96%   Weight:      Height:        Exam: Gen alert, no distress, elderly pleasant AAM No jvd or bruits Chest clear bilat to bases, no rales/ wheezing RRR no RG Abd soft ntnd no mass or ascites +bs GU normal male w/ foley cath in place Ext 1-2+ RLE pretib edema, no other edema Neuro is alert, R hemiparesis      Home meds include - amlodipine 5, carvedilol 25 bid, clopidogrel, lisinopril, metformin, sertraline, chlorthalidone 25 qd, insulin levemir       Date                         Creat               eGFR    2015- 17                   1.02- 1.21        73- 89    2018                         1.35 >> 0.99    AKI episode, >60     2019                         1.75 >> 0.95    AKI episode, 45 >> 96 ml/min    Jun 18, 2018            1.09                 81    November 27, 2020          1.14                 70 ml/min                        11/15/21                     6.64                 8 ml/min    11/15/21                     5.60                 10 ml/min    11/16/21                     5.06                 12  ml/min         UA 6/16 - protein neg, 11-20 rbc, 0-5 wbc, rare bact   Renal US 6/16 -  11 cm kidneys w/ bilat mild hydronephrosis, marked bladder distension and prostatomegaly. Bilat hydronephrosis may be related to outlet obstruction given associated bladder  distention and prostatomegaly.    3.4 L IVF in and 7.6 L UOP since admission = net negative 4.2 L   Assessment/ Plan: AKI - b/l creatinine is 1.0- 1.2, eGFR 72- 88. Creat here 6.64 on admission in setting of fever, ^wbc and possible PNA. UA  unremarkable. Renal US showed very distended bladder w/ prostatomegaly and bilat hydronephrosis. IVF"s were given and foley catheter placed. AKI likely due to obstruction related to BPH, +/- ACEi effects.  We started IVF's at 75 cc/ hr to maintain volume repleted state during recovery phase. Creat down to 5.0 yest and 2.0 today w/ foley catheter placement. Looks like AKI is resolving rapidly. Will need management for urinary retention which is likely the primary cause of his renal failure. No other suggestions, will sign off.  AMS - uremia most likely and /or infection. Resolving.  CAP - getting IV abx for possible PNA HTN - norvasc/ coreg/ lisinopril/ hygroton are all on hold, BP's 130 - 150 sbp. If needs BP lowering meds resume norvasc or coreg and avoid acei/ ARB and diuretics for 1-2 mos.            Rob Shamirah Ivan 11/17/2021, 6:02 AM   Recent Labs  Lab 11/15/21 1224 11/15/21 2023 11/16/21 0050 11/17/21 0227  HGB 11.2*  --  10.8* 9.8*  ALBUMIN 2.8*  --   --  2.1*  CALCIUM 8.1*   < > 7.6* 7.5*  PHOS  --   --   --  2.5  CREATININE 6.64*   < > 5.06* 2.04*  K 5.9*   < > 4.7 3.8   < > = values in this interval not displayed.   Inpatient medications:  Chlorhexidine Gluconate Cloth  6 each Topical Daily   clopidogrel  75 mg Oral Daily   heparin  5,000 Units Subcutaneous Q8H   insulin aspart  0-5 Units Subcutaneous QHS   insulin aspart  0-9 Units Subcutaneous TID WC   mouth rinse  15 mL Mouth Rinse 4 times per day   sertraline  100 mg Oral Daily    sodium chloride 75 mL/hr at 11/16/21 2155   azithromycin 500 mg (11/16/21 1702)   cefTRIAXone (ROCEPHIN)  IV 1 g (11/16/21 1811)   mouth rinse

## 2021-11-18 DIAGNOSIS — N182 Chronic kidney disease, stage 2 (mild): Secondary | ICD-10-CM | POA: Diagnosis not present

## 2021-11-18 DIAGNOSIS — E875 Hyperkalemia: Secondary | ICD-10-CM | POA: Diagnosis not present

## 2021-11-18 DIAGNOSIS — N179 Acute kidney failure, unspecified: Secondary | ICD-10-CM | POA: Diagnosis not present

## 2021-11-18 LAB — RENAL FUNCTION PANEL
Albumin: 2.2 g/dL — ABNORMAL LOW (ref 3.5–5.0)
Anion gap: 6 (ref 5–15)
BUN: 25 mg/dL — ABNORMAL HIGH (ref 8–23)
CO2: 25 mmol/L (ref 22–32)
Calcium: 7.8 mg/dL — ABNORMAL LOW (ref 8.9–10.3)
Chloride: 108 mmol/L (ref 98–111)
Creatinine, Ser: 1.4 mg/dL — ABNORMAL HIGH (ref 0.61–1.24)
GFR, Estimated: 54 mL/min — ABNORMAL LOW (ref >60)
Glucose, Bld: 160 mg/dL — ABNORMAL HIGH (ref 70–99)
Phosphorus: 2.1 mg/dL — ABNORMAL LOW (ref 2.5–4.6)
Potassium: 4.2 mmol/L (ref 3.5–5.1)
Sodium: 139 mmol/L (ref 135–145)

## 2021-11-18 LAB — CBC
HCT: 31.8 % — ABNORMAL LOW (ref 39.0–52.0)
Hemoglobin: 10.1 g/dL — ABNORMAL LOW (ref 13.0–17.0)
MCH: 28.9 pg (ref 26.0–34.0)
MCHC: 31.8 g/dL (ref 30.0–36.0)
MCV: 90.9 fL (ref 80.0–100.0)
Platelets: 152 10*3/uL (ref 150–400)
RBC: 3.5 MIL/uL — ABNORMAL LOW (ref 4.22–5.81)
RDW: 13.5 % (ref 11.5–15.5)
WBC: 8 10*3/uL (ref 4.0–10.5)
nRBC: 0 % (ref 0.0–0.2)

## 2021-11-18 LAB — GLUCOSE, CAPILLARY
Glucose-Capillary: 136 mg/dL — ABNORMAL HIGH (ref 70–99)
Glucose-Capillary: 221 mg/dL — ABNORMAL HIGH (ref 70–99)
Glucose-Capillary: 289 mg/dL — ABNORMAL HIGH (ref 70–99)
Glucose-Capillary: 347 mg/dL — ABNORMAL HIGH (ref 70–99)

## 2021-11-18 MED ORDER — AMLODIPINE BESYLATE 5 MG PO TABS
5.0000 mg | ORAL_TABLET | Freq: Every day | ORAL | Status: DC
  Administered 2021-11-18 – 2021-11-25 (×8): 5 mg via ORAL
  Filled 2021-11-18 (×8): qty 1

## 2021-11-18 NOTE — Progress Notes (Signed)
Duplicate;  vitals are taken at 00:10

## 2021-11-18 NOTE — Progress Notes (Signed)
I&O cathed as per verbal order from MD.  800 cc clear yellow urine output.

## 2021-11-18 NOTE — Progress Notes (Signed)
Physical Therapy Treatment Patient Details Name: [name] MRN: [id number] DOB: [birth year] Today's Date: 11/18/2021   History of Present Illness Pt adm 6/16 with acute renal failure and sepsis. Pt also with obstructive uropathy and pleural effusion. PMH - CVA with rt residual weakness, DM, DVT, HTN, PVD, ckd    PT Comments    Pt received supine and agreeable to session with steady progress towards acute goals. Pt able to come to sitting EOB with min assist to elevate trunk. Pt needing up to mod assist for transfers with trial of quad cane with pt unable to maintain balance with single UE support. Pt able to demonstrate safe transfers to standing with RW with min assist to steady on rise and place R hand on walker. Pt able to demonstrate increased ambulation with min assist and close chair follow for safety as pt continues to fatigue quickly and with noted instability throughout. Current plan remains appropriate to address deficits and maximize functional independence and decrease caregiver burden. Pt continues to benefit from skilled PT services to progress toward functional mobility goals.    Recommendations for follow up therapy are one component of a multi-disciplinary discharge planning process, led by the attending physician.  Recommendations may be updated based on patient status, additional functional criteria and insurance authorization.  Follow Up Recommendations  Skilled nursing-short term rehab (<3 hours/day)     Assistance Recommended at Discharge Frequent or constant Supervision/Assistance  Patient can return home with the following A little help with walking and/or transfers;A little help with bathing/dressing/bathroom;Assistance with cooking/housework;Assist for transportation;Help with stairs or ramp for entrance   Equipment Recommendations  None recommended by PT    Recommendations for Other Services       Precautions / Restrictions Precautions Precautions:  Fall Restrictions Weight Bearing Restrictions: No RUE Weight Bearing: Weight bearing as tolerated RLE Weight Bearing: Weight bearing as tolerated     Mobility  Bed Mobility Overal bed mobility: Needs Assistance Bed Mobility: Supine to Sit     Supine to sit: Min assist     General bed mobility comments: Assist to get RLE off of bed and elevate trunk into sitting    Transfers Overall transfer level: Needs assistance Equipment used: Rolling walker (2 wheels), Quad cane Transfers: Sit to/from Stand Sit to Stand: Mod assist, Min assist           General transfer comment: Pt using rocking torso to come to stand x3 throughout session, posterior LOB with pt coming to sit on EOB with quad cane, asssit to steady with RW and to place Rt hand on walker    Ambulation/Gait Ambulation/Gait assistance: Min assist Gait Distance (Feet): 36 Feet (24'+12') Assistive device: Rolling walker (2 wheels) Gait Pattern/deviations: Step-to pattern, Decreased step length - right, Decreased step length - left, Decreased dorsiflexion - right Gait velocity: decr     General Gait Details: Assist for balance and support. close chair follow for safety   Stairs             Wheelchair Mobility    Modified Rankin (Stroke Patients Only)       Balance Overall balance assessment: Needs assistance Sitting-balance support: No upper extremity supported, Feet supported Sitting balance-Leahy Scale: Fair     Standing balance support: Single extremity supported, During functional activity Standing balance-Leahy Scale: Poor Standing balance comment: UE support and min guard for static standing  Cognition Arousal/Alertness: Awake/alert Behavior During Therapy: WFL for tasks assessed/performed Overall Cognitive Status: Within Functional Limits for tasks assessed                                 General Comments: Some expressive difficulties  from prior CVA.        Exercises      General Comments General comments (skin integrity, edema, etc.): VSS in RA      Pertinent Vitals/Pain Pain Assessment Pain Assessment: No/denies pain    Home Living                          Prior Function            PT Goals (current goals can now be found in the care plan section) Acute Rehab PT Goals Patient Stated Goal: not stated PT Goal Formulation: With patient Time For Goal Achievement: 11/30/21    Frequency    Min 3X/week      PT Plan      Co-evaluation              AM-PAC PT "6 Clicks" Mobility   Outcome Measure  Help needed turning from your back to your side while in a flat bed without using bedrails?: A Little Help needed moving from lying on your back to sitting on the side of a flat bed without using bedrails?: A Little Help needed moving to and from a bed to a chair (including a wheelchair)?: A Little Help needed standing up from a chair using your arms (e.g., wheelchair or bedside chair)?: A Little Help needed to walk in hospital room?: A Lot Help needed climbing 3-5 steps with a railing? : Total 6 Click Score: 15    End of Session Equipment Utilized During Treatment: Gait belt Activity Tolerance: Patient limited by fatigue Patient left: in chair;with call bell/phone within reach;with chair alarm set;with family/visitor present Nurse Communication: Mobility status PT Visit Diagnosis: Unsteadiness on feet (R26.81);Other abnormalities of gait and mobility (R26.89);History of falling (Z91.81)     Time: 5573-2202 PT Time Calculation (min) (ACUTE ONLY): 28 min  Charges:  $Gait Training: 23-37 mins                     [name] R. PTA Acute Rehabilitation Services Office: [place] 11/18/2021, 10:31 AM

## 2021-11-18 NOTE — Assessment & Plan Note (Addendum)
CBG in 24hrs 126-233. Required 12u SAI.  Blood Glucose this morning was 126  - hold home metformin for AKI - Started on Jardiance '10mg'$  daily - sensitive SSI - CBG qAC and qHS

## 2021-11-18 NOTE — Progress Notes (Signed)
     Daily Progress Note Intern Pager: [phone]  Patient name: [name] Medical record number: [id number] Date of birth: [birth year] Age: 71 y.o. Gender: male  Primary Care Provider: Donney Dice, DO Consultants: Nephro Code Status: Full  Pt Overview and Major Events to Date:  6/16: Admitted-Foley catheter placed 6/17: Nephro consulted  Assessment and Plan:  Vencil Basnett is a 71 year old male admitted with AKI and AMS. Pertinent PMH/PSH includes CVA, T2DM, HTN, DVT, PVD, CKD 2, HFpEF. .  * Acute renal failure superimposed on stage 2 chronic kidney disease ([place]) Postobstructive AKI improving with creatinine at 1.40 today (from 6.64 on admission).  -Nephro signed off, appreciate their recommendations -Continue Foley catheter  -Plan for void trail today -Strict I's and O's -Avoid nephrotoxic agents -Daily BMP  Obstructive uropathy Secondary to prostatomegaly. -Continue Flomax 0.'4mg'$  daily -Continue Foley catheter - Void trial today -Consider starting Finasteride if pt fail void trail -Recommend outpatient urology f/u  Diabetes mellitus, type II ([place]) CBG in 24hrs 136-294. Required 10u SAI.  Blood Glucose this morning was 136  - hold home metformin for AKI - sensitive SSI - CBG qAC and qHS  CAP (community acquired pneumonia)-resolved as of 11/18/2021 Remains afebrile, leukocytosis resolved. Asymptomatic. - d/c ceftriaxone and azithromycin  Essential hypertension Systolic BP minimally elevated to 135-175s. Diastolic on low end (73-22G). - monitor BP - Restarted amlodipine '5mg'$  - holding home carvedilol  - holding home lisinopril and chlorthalidone (reported not taking) due to AKI   FEN/GI: DYS 3 diet PPx: Heparin Dispo:Home in 2-3 days. Barriers include clinical status.   Subjective:  [name] was laying in bed comfortably.  He said he is doing well and has no complaints this morning.  Objective: Temp:  [98.2 F (36.8 C)-99.4 F (37.4 C)] 99 F  (37.2 C) (06/19 0744) Pulse Rate:  [62-80] 63 (06/19 0337) Resp:  [16-20] 20 (06/19 0337) BP: (135-175)/(56-81) 167/70 (06/19 0744) SpO2:  [96 %-100 %] 96 % (06/19 0337) Physical Exam: General: Alert, well appearing, NAD CV: RRR, no murmurs, normal S1/S2 Pulm: CTAB, good WOB on RA, no crackles or wheezing Abd: Soft, no distension, no tenderness Skin: dry, warm Ext: No BLE edema, +2 Pedal and radial pulse.   Laboratory: Most recent CBC Lab Results  Component Value Date   WBC 8.0 11/18/2021   HGB 10.1 (L) 11/18/2021   HCT 31.8 (L) 11/18/2021   MCV 90.9 11/18/2021   PLT 152 11/18/2021   Most recent BMP    Latest Ref Rng & Units 11/18/2021    2:31 AM  BMP  Glucose 70 - 99 mg/dL 160   BUN 8 - 23 mg/dL 25   Creatinine 0.61 - 1.24 mg/dL 1.40   Sodium 135 - 145 mmol/L 139   Potassium 3.5 - 5.1 mmol/L 4.2   Chloride 98 - 111 mmol/L 108   CO2 22 - 32 mmol/L 25   Calcium 8.9 - 10.3 mg/dL 7.8     Imaging/Diagnostic Tests: No new images  Alen Bleacher, MD 11/18/2021, 11:06 AM  PGY-1, [place] Intern pager: [phone], text pages welcome Secure chat group [facility] Teaching Service

## 2021-11-18 NOTE — Progress Notes (Signed)
Foley discontinued as per orders.  Pt due to void by 1930.

## 2021-11-18 NOTE — Progress Notes (Signed)
  S:Went to bedside to check on patient, he was asleep so I did not wake him.   O: BP (!) 169/56 (BP Location: Left Arm)   Pulse 62   Temp 98.5 F (36.9 C) (Oral)   Resp 18   Ht '5\' 7"'$  (1.702 m)   Wt 96.2 kg   SpO2 98%   BMI 33.22 kg/m   General: Patient resting comfortably, in no acute distress. Resp: normal work of breathing noted  A/P: Patient admitted for AKI, renal function improving. History of obstructive uropathy, plan for voiding trial in the morning. Vitals stable. Orders reviewed. Continue current plan per day team.   Donney Dice, DO 11/18/2021, 2:44 AM PGY-2, [place] Medicine Service pager [phone]

## 2021-11-18 NOTE — TOC Progression Note (Addendum)
Transition of Care [facility]) - Progression Note    Patient Details  Name: [name] MRN: [id number] Date of Birth: 1951-05-29  Transition of Care [facility]) CM/SW [place], [place] Phone Number: 11/18/2021, 2:39 PM  Clinical Narrative:    CSW followed up with dtr to provide bed offer, only [facility] has offered. Dtr requested [facility] and was advised they are not in network. Dtr states they would like a private room. Christine with [facility] is seeing if they have one and will let CSW know. CSW requested [facility] review pt, still pending. TOC will continue to follow for DC needs.  4:18 CSW attempted to follow up with [facility] x3 to confirm a private bed, no answer received. CSW followed up w/ dtr who noted the last time she was in that facility it was disgusting and she would try to tour tomorrow. CSW asked if family had considered HH or family care if they did not want [place], she said no, he would just go home. TOC will continue to follow for DC needs.  Expected Discharge Plan: [place] Barriers to Discharge: Continued Medical Work up  Expected Discharge Plan and Services Expected Discharge Plan: [place] In-house Referral: Clinical Social Work     Living arrangements for the past 2 months: Single Family Home                                       Social Determinants of Health (SDOH) Interventions    Readmission Risk Interventions     No data to display

## 2021-11-19 ENCOUNTER — Other Ambulatory Visit (HOSPITAL_COMMUNITY): Payer: Self-pay

## 2021-11-19 DIAGNOSIS — N179 Acute kidney failure, unspecified: Secondary | ICD-10-CM | POA: Diagnosis not present

## 2021-11-19 DIAGNOSIS — E875 Hyperkalemia: Secondary | ICD-10-CM | POA: Diagnosis not present

## 2021-11-19 DIAGNOSIS — E1159 Type 2 diabetes mellitus with other circulatory complications: Secondary | ICD-10-CM | POA: Diagnosis not present

## 2021-11-19 DIAGNOSIS — N1330 Unspecified hydronephrosis: Secondary | ICD-10-CM | POA: Diagnosis not present

## 2021-11-19 LAB — RENAL FUNCTION PANEL
Albumin: 2.5 g/dL — ABNORMAL LOW (ref 3.5–5.0)
Anion gap: 7 (ref 5–15)
BUN: 21 mg/dL (ref 8–23)
CO2: 24 mmol/L (ref 22–32)
Calcium: 8.2 mg/dL — ABNORMAL LOW (ref 8.9–10.3)
Chloride: 107 mmol/L (ref 98–111)
Creatinine, Ser: 1.28 mg/dL — ABNORMAL HIGH (ref 0.61–1.24)
GFR, Estimated: >60 mL/min (ref >60)
Glucose, Bld: 158 mg/dL — ABNORMAL HIGH (ref 70–99)
Phosphorus: 2.3 mg/dL — ABNORMAL LOW (ref 2.5–4.6)
Potassium: 4.6 mmol/L (ref 3.5–5.1)
Sodium: 138 mmol/L (ref 135–145)

## 2021-11-19 LAB — CBC
HCT: 35.5 % — ABNORMAL LOW (ref 39.0–52.0)
Hemoglobin: 11.1 g/dL — ABNORMAL LOW (ref 13.0–17.0)
MCH: 28.5 pg (ref 26.0–34.0)
MCHC: 31.3 g/dL (ref 30.0–36.0)
MCV: 91.3 fL (ref 80.0–100.0)
Platelets: 187 10*3/uL (ref 150–400)
RBC: 3.89 MIL/uL — ABNORMAL LOW (ref 4.22–5.81)
RDW: 13.3 % (ref 11.5–15.5)
WBC: 10.1 10*3/uL (ref 4.0–10.5)
nRBC: 0 % (ref 0.0–0.2)

## 2021-11-19 LAB — GLUCOSE, CAPILLARY
Glucose-Capillary: 183 mg/dL — ABNORMAL HIGH (ref 70–99)
Glucose-Capillary: 248 mg/dL — ABNORMAL HIGH (ref 70–99)
Glucose-Capillary: 243 mg/dL — ABNORMAL HIGH (ref 70–99)
Glucose-Capillary: 282 mg/dL — ABNORMAL HIGH (ref 70–99)

## 2021-11-19 MED ORDER — CARVEDILOL 25 MG PO TABS
25.0000 mg | ORAL_TABLET | Freq: Two times a day (BID) | ORAL | Status: DC
  Administered 2021-11-19 – 2021-11-25 (×13): 25 mg via ORAL
  Filled 2021-11-19 (×13): qty 1

## 2021-11-19 MED ORDER — ATORVASTATIN CALCIUM 40 MG PO TABS
40.0000 mg | ORAL_TABLET | Freq: Every day | ORAL | Status: DC
  Administered 2021-11-19 – 2021-11-24 (×6): 40 mg via ORAL
  Filled 2021-11-19 (×6): qty 1

## 2021-11-19 MED ORDER — EMPAGLIFLOZIN 10 MG PO TABS
10.0000 mg | ORAL_TABLET | Freq: Every day | ORAL | Status: DC
  Administered 2021-11-19 – 2021-11-25 (×7): 10 mg via ORAL
  Filled 2021-11-19 (×7): qty 1

## 2021-11-19 MED ORDER — TAMSULOSIN HCL 0.4 MG PO CAPS
0.8000 mg | ORAL_CAPSULE | Freq: Every day | ORAL | Status: DC
  Administered 2021-11-20 – 2021-11-25 (×6): 0.8 mg via ORAL
  Filled 2021-11-19 (×6): qty 2

## 2021-11-19 MED ORDER — TAMSULOSIN HCL 0.4 MG PO CAPS
0.4000 mg | ORAL_CAPSULE | Freq: Once | ORAL | Status: AC
  Administered 2021-11-19: 0.4 mg via ORAL
  Filled 2021-11-19: qty 1

## 2021-11-19 MED ORDER — FINASTERIDE 5 MG PO TABS
5.0000 mg | ORAL_TABLET | Freq: Every day | ORAL | Status: DC
Start: 2021-11-19 — End: 2021-11-20
  Administered 2021-11-19 – 2021-11-20 (×2): 5 mg via ORAL
  Filled 2021-11-19 (×2): qty 1

## 2021-11-19 NOTE — Care Management Important Message (Signed)
Important Message  Patient Details  Name: [name] MRN: [id number] Date of Birth: 08/01/1950   Medicare Important Message Given:  Yes     [name] 11/19/2021, 9:11 AM

## 2021-11-19 NOTE — TOC Benefit Eligibility Note (Signed)
Patient Teacher, English as a foreign language completed.    The patient is currently admitted and upon discharge could be taking Farxiga 10 mg.  The current 30 day co-pay is, $10.35.   The patient is currently admitted and upon discharge could be taking Jardiance 10 mg.  The current 30 day co-pay is, $10.35.   The patient is insured through [place], [place] Patient Advocate Specialist [place] Patient Advocate Team Direct Number: [phone]  Fax: [phone]

## 2021-11-19 NOTE — Progress Notes (Addendum)
     Daily Progress Note Intern Pager: [phone]  Patient name: KAJ VASIL Medical record number: [id number] Date of birth: 04-22-1951 Age: 71 y.o. Gender: male  Primary Care Provider: Donney Dice, DO Consultants: Nephrology Code Status: Full  Pt Overview and Major Events to Date:  6/16: Admitted-Foley placed 6/17: Nephrology consulted  Assessment and Plan: Frutoso Dimare is a 71 year old male admitted with AKI and altered mental status.. Pertinent PMH/PSH includes CVA, T2DM, HTN, DVT, PVD, CKD 2, HFpEF.   * Acute renal failure superimposed on stage 2 chronic kidney disease ([place]) Patient with postobstructive AKI.  has improved creatinine of 1.28 this morning (from 6.64 on admission).   -Strict I's and O's -Start finasteride '5mg'$  daily -Avoid nephrotoxic agents -Daily BMP  Obstructive uropathy Secondary to prostatomegaly. -Will obtain PSA -Increase Flomax 0.'8mg'$  daily -Foley if fail void trail -Finasteride started  -Recommend outpatient urology f/u  Diabetes mellitus, type II ([place]) CBG in 24hrs 158-347. Required 11u SAI.  Blood Glucose this morning was 136  - hold home metformin for AKI - Started on Jardiance '10mg'$  daily - sensitive SSI - CBG qAC and qHS  CAP (community acquired pneumonia)-resolved as of 11/18/2021 Remains afebrile, leukocytosis resolved. Asymptomatic. - d/c ceftriaxone and azithromycin  Essential hypertension BP in 24hrs elevated to 161-185/56-73.  - monitor BP - Continue tarted amlodipine '5mg'$  - Restart home carvedilol  - holding home lisinopril and chlorthalidone (reported not taking) due to AKI   FEN/GI: DYS 3 diet PPx: Heparin Dispo:SNF pending clinical improvement . Barriers include clinical status.   Subjective:  Mr. Bignell was laying down comfortably in bed.  He said he was having some discomfort last night that improved after in and out catheter was placed to drain urine.  Has no complaints this morning.  Objective: Temp:  [98.9  F (37.2 C)-99.8 F (37.7 C)] 99.8 F (37.7 C) (06/20 1216) Pulse Rate:  [61-90] 90 (06/20 1216) Resp:  [17-20] 20 (06/20 1216) BP: (161-185)/(56-74) 165/74 (06/20 1216) SpO2:  [97 %-99 %] 99 % (06/20 1216) Weight:  [96.9 kg] 96.9 kg (06/20 0420) Physical Exam: General: Alert, well appearing, NAD CV: RRR, no murmurs, normal S1/S2 Pulm: CTAB, good WOB on RA, no crackles or wheezing Abd: Soft, no distension, no tenderness Ext: No BLE edema, +2 Pedal and radial pulse.   Laboratory: Most recent CBC Lab Results  Component Value Date   WBC 10.1 11/19/2021   HGB 11.1 (L) 11/19/2021   HCT 35.5 (L) 11/19/2021   MCV 91.3 11/19/2021   PLT 187 11/19/2021   Most recent BMP    Latest Ref Rng & Units 11/19/2021    2:23 AM  BMP  Glucose 70 - 99 mg/dL 158   BUN 8 - 23 mg/dL 21   Creatinine 0.61 - 1.24 mg/dL 1.28   Sodium 135 - 145 mmol/L 138   Potassium 3.5 - 5.1 mmol/L 4.6   Chloride 98 - 111 mmol/L 107   CO2 22 - 32 mmol/L 24   Calcium 8.9 - 10.3 mg/dL 8.2      Imaging/Diagnostic Tests: No new images  Alen Bleacher, MD 11/19/2021, 12:19 PM  PGY-1, [place] Intern pager: [phone], text pages welcome Secure chat group [place]

## 2021-11-19 NOTE — Hospital Course (Addendum)
[name] is a 71 y.o. male who was admitted to the [facility] Teaching Service at [facility] for AMS and AKI. Hospital course is outlined below by system.   Postrenal AKI 2/2 Prostatomegaly Patient presented to the ED due to altered mental status.  His initial labs showed a creatinine of 6.64, BUN 84 and potassium of 5.9. Renal ultrasound showed mild bilateral hydronephrosis bladder scan showed 788 mm urine retained.  His lab result and image findings was consistent with postrenal AKI and foley was placed. With fluid resuscitation and and foley placement patient's AMS improved. He was started on Flomax 0.4 mg and titrated up after failing void trail. PSA level was     PCP follow-up recommendations Started patient on 0.8 mg Flomax and finasteride 5 mg daily Patient is to follow up with outpatient Urology for enlarged prostate. Metformin held due to poor kidney function.  Started on Jardiance '10mg'$  daily for glycemic control. Will need BMP as Lisinopril was recently restarted.

## 2021-11-19 NOTE — Progress Notes (Signed)
FPTS Brief Progress Note  S:Patient resting comfortably so was not awakened.   O: BP (!) 150/65 (BP Location: Left Arm)   Pulse 71   Temp 99.3 F (37.4 C) (Oral)   Resp 14   Ht '5\' 7"'$  (1.702 m)   Wt 96.9 kg   SpO2 96%   BMI 33.47 kg/m   Equal rise and fall of chest; no acute distress.  A/P: VSS. Total evening output: 2.7 L. -Continue plan per day team  - Orders reviewed. Labs for AM ordered, which was adjusted as needed.   Rosezetta Schlatter, MD 11/20/2021, 12:43 AM PGY-1, [place] Night Resident  Please page [phone] with questions.

## 2021-11-19 NOTE — Progress Notes (Signed)
  S:Went to bedside to check on patient, he was asleep so I did not wake him.   O: BP (!) 161/56 (BP Location: Left Arm)   Pulse 61   Temp 99.5 F (37.5 C) (Oral)   Resp 20   Ht '5\' 7"'$  (1.702 m)   Wt 96.2 kg   SpO2 97%   BMI 33.22 kg/m   General: Patient resting comfortably, in no acute distress. Resp: normal work of breathing noted  A/P: Vitals stable and orders reviewed. Cardiac telemetry reviewed. Recent bladder scan was 360 per nurse report earlier in the shift. Plan for next bladder scan in the morning, patient will likely benefit from foley placement if retaining more than 300 mL. Continue plan per day team.   Donney Dice, DO 11/19/2021, 1:32 AM PGY-2, [place] Medicine Service pager [phone]

## 2021-11-20 DIAGNOSIS — N179 Acute kidney failure, unspecified: Secondary | ICD-10-CM | POA: Diagnosis not present

## 2021-11-20 DIAGNOSIS — N139 Obstructive and reflux uropathy, unspecified: Secondary | ICD-10-CM | POA: Diagnosis not present

## 2021-11-20 DIAGNOSIS — E1159 Type 2 diabetes mellitus with other circulatory complications: Secondary | ICD-10-CM | POA: Diagnosis not present

## 2021-11-20 DIAGNOSIS — N1330 Unspecified hydronephrosis: Secondary | ICD-10-CM | POA: Diagnosis not present

## 2021-11-20 LAB — CULTURE, BLOOD (ROUTINE X 2)
Culture: NO GROWTH
Culture: NO GROWTH

## 2021-11-20 LAB — GLUCOSE, CAPILLARY
Glucose-Capillary: 144 mg/dL — ABNORMAL HIGH (ref 70–99)
Glucose-Capillary: 192 mg/dL — ABNORMAL HIGH (ref 70–99)
Glucose-Capillary: 215 mg/dL — ABNORMAL HIGH (ref 70–99)
Glucose-Capillary: 233 mg/dL — ABNORMAL HIGH (ref 70–99)

## 2021-11-20 LAB — BASIC METABOLIC PANEL
Anion gap: 10 (ref 5–15)
BUN: 29 mg/dL — ABNORMAL HIGH (ref 8–23)
CO2: 21 mmol/L — ABNORMAL LOW (ref 22–32)
Calcium: 8.1 mg/dL — ABNORMAL LOW (ref 8.9–10.3)
Chloride: 109 mmol/L (ref 98–111)
Creatinine, Ser: 1.43 mg/dL — ABNORMAL HIGH (ref 0.61–1.24)
GFR, Estimated: 53 mL/min — ABNORMAL LOW (ref >60)
Glucose, Bld: 194 mg/dL — ABNORMAL HIGH (ref 70–99)
Potassium: 4.2 mmol/L (ref 3.5–5.1)
Sodium: 140 mmol/L (ref 135–145)

## 2021-11-20 LAB — PSA: Prostatic Specific Antigen: 497 ng/mL — ABNORMAL HIGH (ref 0.00–4.00)

## 2021-11-20 MED ORDER — INSULIN ASPART 100 UNIT/ML IJ SOLN
0.0000 [IU] | Freq: Every day | INTRAMUSCULAR | Status: DC
  Administered 2021-11-20 – 2021-11-21 (×2): 2 [IU] via SUBCUTANEOUS
  Administered 2021-11-22: 4 [IU] via SUBCUTANEOUS
  Administered 2021-11-24: 2 [IU] via SUBCUTANEOUS

## 2021-11-20 MED ORDER — INSULIN ASPART 100 UNIT/ML IJ SOLN
0.0000 [IU] | Freq: Three times a day (TID) | INTRAMUSCULAR | Status: DC
  Administered 2021-11-20: 5 [IU] via SUBCUTANEOUS
  Administered 2021-11-20 – 2021-11-21 (×2): 3 [IU] via SUBCUTANEOUS
  Administered 2021-11-21: 2 [IU] via SUBCUTANEOUS
  Administered 2021-11-21: 8 [IU] via SUBCUTANEOUS
  Administered 2021-11-22: 5 [IU] via SUBCUTANEOUS
  Administered 2021-11-22: 8 [IU] via SUBCUTANEOUS
  Administered 2021-11-22: 11 [IU] via SUBCUTANEOUS
  Administered 2021-11-23: 15 [IU] via SUBCUTANEOUS
  Administered 2021-11-23: 2 [IU] via SUBCUTANEOUS
  Administered 2021-11-23: 11 [IU] via SUBCUTANEOUS
  Administered 2021-11-24: 2 [IU] via SUBCUTANEOUS
  Administered 2021-11-24: 15 [IU] via SUBCUTANEOUS
  Administered 2021-11-24: 11 [IU] via SUBCUTANEOUS
  Administered 2021-11-25: 2 [IU] via SUBCUTANEOUS

## 2021-11-20 NOTE — TOC Progression Note (Signed)
Transition of Care [facility]) - Progression Note    Patient Details  Name: [name] MRN: [id number] Date of Birth: [birth year]  Transition of Care [facility]) CM/SW [place], [place] Phone Number: 11/20/2021, 3:32 PM  Clinical Narrative:     CSW soke with patient's daughter- CSW informed SNF choice is needed to move forward, he is medically stable for discharge. She expressed some frustration because patient would more than likely leave unable to void on his on. She believed he may not get proper care at [facility]. CSW acknowledged her concerns but informed it was not uncommon for patient to d/c to SNF w/foley. CSW encourage family and informed her of ways she can monitor his progress and communicate with SNF nursing staff. CSW was agreeable to CSW following up with [facility] for private rooms.   CSW contacted [facility]- they have semi private room, that patient can be in alone but he will have to share bathroom  [facility]- advised they will call CSW back.   CSW returned call to patient's daughter and provided update- she states not too interested with [facility] and will go and visit [facility] today. CSW advised will need SNF choice as soon possible.  Thurmond Butts, MSW, LCSW Clinical Social Worker      Expected Discharge Plan: Skilled Nursing Facility Barriers to Discharge: Continued Medical Work up  Expected Discharge Plan and Services Expected Discharge Plan: [place] In-house Referral: Clinical Social Work     Living arrangements for the past 2 months: Single Family Home                                       Social Determinants of Health (SDOH) Interventions    Readmission Risk Interventions     No data to display

## 2021-11-20 NOTE — Progress Notes (Addendum)
     Daily Progress Note Intern Pager: [phone]  Patient name: [name] Medical record number: [id number] Date of birth: [birth year] Age: 71 y.o. Gender: male  Primary Care Provider: Donney Dice, DO Consultants: Nephrology Code Status: Full  Pt Overview and Major Events to Date:  6/16: Admitted, Foley placed 6/17: Nephrology consulted 6/20: Failed void trial  Assessment and Plan: [name] is a 71 year old male admitted with AKI and AMS. Pertinent PMH/PSH includes CVA, T2DM, HTN, DVT, PVD, CKD 2, HFpEF.   * Acute renal failure superimposed on stage 2 chronic kidney disease ([place]) Patient with postobstructive AKI.  Creatinine this morning was 1.43. UOP in 24hr 4200. Medically stable and awaiting SNF placement  -Strict I's and O's -Avoid nephrotoxic agents -Daily BMP  Obstructive uropathy Secondary to prostatomegaly. PSA this morning was 497. Will discontinue Finasteride due to increased risk of prostate cancer. -Continue Flomax 0.'8mg'$  daily -Continue foley  -d/c Finasteride -Recommend outpatient urology f/u  Diabetes mellitus, type II ([place]) CBG in 24hrs 158-347. Required 11u SAI.  Blood Glucose this morning was 136  - hold home metformin for AKI - Started on Jardiance '10mg'$  daily - sensitive SSI - CBG qAC and qHS  Essential hypertension BP in 24hrs is elevated but improved (150-169/60-74). This morning 152/60. - Monitor BP with routine vitals - Continue amlodipine '5mg'$  - Continue home carvedilol  - holding home lisinopril and chlorthalidone (reported not taking) due to AKI   FEN/GI: DYS 3 diet PPx: Heparin Dispo:SNF  pending placement .    Subjective:  Mr. Schadt was laying comfortably in bed.  He said he is doing well and has no complaints.  Objective: Temp:  [98.8 F (37.1 C)-99.8 F (37.7 C)] 98.8 F (37.1 C) (06/21 1115) Pulse Rate:  [62-90] 63 (06/21 1115) Resp:  [14-20] 16 (06/21 1115) BP: (142-165)/(60-74) 142/62 (06/21 1115) SpO2:  [96  %-100 %] 96 % (06/21 1115) Weight:  [94.8 kg] 94.8 kg (06/21 0500) Physical Exam: General: Alert, well appearing, NAD CV: RRR, no murmurs, normal S1/S2 Pulm: CTAB, good WOB on RA,  Abd: Soft, no distension, no tenderness Ext: No BLE edema, +2 Pedal and radial pulse.   Laboratory: Most recent CBC Lab Results  Component Value Date   WBC 10.1 11/19/2021   HGB 11.1 (L) 11/19/2021   HCT 35.5 (L) 11/19/2021   MCV 91.3 11/19/2021   PLT 187 11/19/2021   Most recent BMP    Latest Ref Rng & Units 11/20/2021    1:00 AM  BMP  Glucose 70 - 99 mg/dL 194   BUN 8 - 23 mg/dL 29   Creatinine 0.61 - 1.24 mg/dL 1.43   Sodium 135 - 145 mmol/L 140   Potassium 3.5 - 5.1 mmol/L 4.2   Chloride 98 - 111 mmol/L 109   CO2 22 - 32 mmol/L 21   Calcium 8.9 - 10.3 mg/dL 8.1     Imaging/Diagnostic Tests: No new Images   Alen Bleacher, MD 11/20/2021, 11:23 AM  PGY-1, [place] Intern pager: [phone], text pages welcome Secure chat group [facility] Teaching Service

## 2021-11-20 NOTE — Progress Notes (Signed)
Occupational Therapy Treatment Patient Details Name: [name] MRN: [id number] DOB: 1951-01-10 Today's Date: 11/20/2021   History of present illness Pt adm 6/16 with acute renal failure and sepsis. Pt also with obstructive uropathy and pleural effusion. PMH - CVA with rt residual weakness, DM, DVT, HTN, PVD, ckd   OT comments  Patient received in supine and agreeable to OT session. Patient was able to get to EOB with use of rail and assistance with RLE. Patient stood from EOB to RW and transferred to recliner with min/mod assist. Patient was setup for self feeding and was able to feed self with setup and supervision. Grooming performed with setup and assistance to load toothpaste. Patient making good progress and will continue to be followed by acute OT.    Recommendations for follow up therapy are one component of a multi-disciplinary discharge planning process, led by the attending physician.  Recommendations may be updated based on patient status, additional functional criteria and insurance authorization.    Follow Up Recommendations  Skilled nursing-short term rehab (<3 hours/day)    Assistance Recommended at Discharge Frequent or constant Supervision/Assistance  Patient can return home with the following  A little help with walking and/or transfers;A little help with bathing/dressing/bathroom   Equipment Recommendations  None recommended by OT    Recommendations for Other Services      Precautions / Restrictions Precautions Precautions: Fall Restrictions Weight Bearing Restrictions: No RUE Weight Bearing: Weight bearing as tolerated RLE Weight Bearing: Weight bearing as tolerated       Mobility Bed Mobility Overal bed mobility: Needs Assistance Bed Mobility: Supine to Sit     Supine to sit: Min assist     General bed mobility comments: assistance with RLE, uses rail to assist with trunk    Transfers Overall transfer level: Needs assistance Equipment used:  Rolling walker (2 wheels) Transfers: Sit to/from Stand, Bed to chair/wheelchair/BSC Sit to Stand: Mod assist, Min assist     Step pivot transfers: Min assist, Mod assist     General transfer comment: assistance to guide RW and verbal cues for hand placement     Balance Overall balance assessment: Needs assistance Sitting-balance support: No upper extremity supported, Feet supported Sitting balance-Leahy Scale: Fair     Standing balance support: Single extremity supported, During functional activity Standing balance-Leahy Scale: Poor Standing balance comment: reliant on UE support                           ADL either performed or assessed with clinical judgement   ADL Overall ADL's : Needs assistance/impaired Eating/Feeding: Supervision/ safety;Sitting;Set up Eating/Feeding Details (indicate cue type and reason): feed self while seated up in recliner Grooming: Minimal assistance;Sitting;Wash/dry hands;Wash/dry face;Oral care Grooming Details (indicate cue type and reason): required assistance to load toothbrush                               General ADL Comments: performed feeding and grooming seated in recliner    Extremity/Trunk Assessment Upper Extremity Assessment RUE Deficits / Details: pt with decreased use of RUE. No functional movement noted. Increased tone noted.  Unable to perform full PROM. Pt states he does not work with RUE            Vision   Additional Comments: complains of blurred vision   Perception     Praxis      Cognition Arousal/Alertness: Awake/alert  Behavior During Therapy: WFL for tasks assessed/performed Overall Cognitive Status: Within Functional Limits for tasks assessed                                 General Comments: Some expressive difficulties from prior CVA.        Exercises      Shoulder Instructions       General Comments      Pertinent Vitals/ Pain       Pain Assessment Pain  Assessment: No/denies pain  Home Living                                          Prior Functioning/Environment              Frequency  Min 2X/week        Progress Toward Goals  OT Goals(current goals can now be found in the care plan section)  Progress towards OT goals: Progressing toward goals  Acute Rehab OT Goals Patient Stated Goal: get better OT Goal Formulation: With patient Time For Goal Achievement: 12/01/21 Potential to Achieve Goals: Good ADL Goals Pt Will Perform Eating: with set-up;sitting Pt Will Perform Grooming: with set-up;sitting Pt Will Perform Upper Body Bathing: with supervision;sitting Pt Will Transfer to Toilet: with min guard assist;bedside commode Pt Will Perform Toileting - Clothing Manipulation and hygiene: with min assist;sit to/from stand;sitting/lateral leans  Plan Discharge plan remains appropriate    Co-evaluation                 AM-PAC OT "6 Clicks" Daily Activity     Outcome Measure   Help from another person eating meals?: A Little Help from another person taking care of personal grooming?: A Little Help from another person toileting, which includes using toliet, bedpan, or urinal?: A Lot Help from another person bathing (including washing, rinsing, drying)?: A Lot Help from another person to put on and taking off regular upper body clothing?: A Little Help from another person to put on and taking off regular lower body clothing?: A Lot 6 Click Score: 15    End of Session Equipment Utilized During Treatment: Gait belt;Rolling walker (2 wheels)  OT Visit Diagnosis: Unsteadiness on feet (R26.81);Muscle weakness (generalized) (M62.81);History of falling (Z91.81);Other abnormalities of gait and mobility (R26.89);Hemiplegia and hemiparesis Hemiplegia - Right/Left: Right Hemiplegia - dominant/non-dominant: Dominant Hemiplegia - caused by: Cerebral infarction   Activity Tolerance Patient tolerated treatment  well   Patient Left in chair;with call bell/phone within reach;with chair alarm set   Nurse Communication Mobility status        Time: 9470-9628 OT Time Calculation (min): 25 min  Charges: OT General Charges $OT Visit: 1 Visit OT Treatments $Self Care/Home Management : 23-37 mins  Lodema Hong, [place]  Office [place] 11/20/2021, 12:14 PM

## 2021-11-20 NOTE — TOC Progression Note (Signed)
Transition of Care [facility]) - Progression Note    Patient Details  Name: [name] MRN: [id number] Date of Birth: 1950/06/14  Transition of Care [facility]) CM/SW [place], [place] Phone Number: 11/20/2021, 5:34 PM  Clinical Narrative:     Family has selected [place] will start insurance auth in the morning.   TOC will continue to follow and assist with discharge planning.   Thurmond Butts, MSW, LCSW Clinical Social Worker    Expected Discharge Plan: Skilled Nursing Facility Barriers to Discharge: Continued Medical Work up  Expected Discharge Plan and Services Expected Discharge Plan: [place] In-house Referral: Clinical Social Work     Living arrangements for the past 2 months: Single Family Home                                       Social Determinants of Health (SDOH) Interventions    Readmission Risk Interventions     No data to display

## 2021-11-20 NOTE — Progress Notes (Signed)
Physical Therapy Treatment Patient Details Name: [name] MRN: [id number] DOB: Oct 06, 1950 Today's Date: 11/20/2021   History of Present Illness Pt adm 6/16 with acute renal failure and sepsis. Pt also with obstructive uropathy and pleural effusion. PMH - CVA with rt residual weakness, DM, DVT, HTN, PVD, ckd    PT Comments    Pt received OOB in chair on arrival and agreeable to session with continued progress toward acute goals. Pt able to demonstrate improved transfers with increased stability on rise and ability to static stand without UE support to use L hand to place R hand on RW without assist. Pt needing min assist for ambulation to steady as pt continues to demonstrate general unsteady gait sliding RLE to advance forward, but with noted improvement in ability to maintain trunk and hips forward and less rotated for a less side-stepping motion. Current plan remains appropriate to address deficits and maximize functional independence and decrease caregiver burden. Pt continues to benefit from skilled PT services to progress toward functional mobility goals.    Recommendations for follow up therapy are one component of a multi-disciplinary discharge planning process, led by the attending physician.  Recommendations may be updated based on patient status, additional functional criteria and insurance authorization.  Follow Up Recommendations  Skilled nursing-short term rehab (<3 hours/day)     Assistance Recommended at Discharge Frequent or constant Supervision/Assistance  Patient can return home with the following A little help with walking and/or transfers;A little help with bathing/dressing/bathroom;Assistance with cooking/housework;Assist for transportation;Help with stairs or ramp for entrance   Equipment Recommendations  None recommended by PT    Recommendations for Other Services       Precautions / Restrictions Precautions Precautions: Fall Restrictions Weight Bearing  Restrictions: No RUE Weight Bearing: Weight bearing as tolerated RLE Weight Bearing: Weight bearing as tolerated     Mobility  Bed Mobility Overal bed mobility: Needs Assistance Bed Mobility: Sit to Supine       Sit to supine: Min assist   General bed mobility comments: min assist to position in center in bed    Transfers Overall transfer level: Needs assistance Equipment used: Rolling walker (2 wheels) Transfers: Sit to/from Stand, Bed to chair/wheelchair/BSC Sit to Stand: Min assist           General transfer comment: min assist to steady on rise, pt using rocking technique to come to standing, able to stand without UE support and use L hand to place R hand on RW without asssit    Ambulation/Gait Ambulation/Gait assistance: Min assist Gait Distance (Feet): 75 Feet (30+30+15) Assistive device: Rolling walker (2 wheels) Gait Pattern/deviations: Step-to pattern, Decreased step length - right, Decreased step length - left, Decreased dorsiflexion - right Gait velocity: decr     General Gait Details: step-to pattern sliding RLE to advance, better control and ability to keep body front facing   Stairs             Wheelchair Mobility    Modified Rankin (Stroke Patients Only)       Balance Overall balance assessment: Needs assistance Sitting-balance support: No upper extremity supported, Feet supported Sitting balance-Leahy Scale: Fair     Standing balance support: Single extremity supported, During functional activity Standing balance-Leahy Scale: Poor Standing balance comment: reliant on UE support                            Cognition Arousal/Alertness: Awake/alert Behavior During Therapy: [facility] for  tasks assessed/performed Overall Cognitive Status: Within Functional Limits for tasks assessed                                 General Comments: Some expressive difficulties from prior CVA.        Exercises Other  Exercises Other Exercises: STS x3    General Comments General comments (skin integrity, edema, etc.): VSS on RA      Pertinent Vitals/Pain Pain Assessment Pain Assessment: No/denies pain    Home Living                          Prior Function            PT Goals (current goals can now be found in the care plan section) Acute Rehab PT Goals PT Goal Formulation: With patient Time For Goal Achievement: 11/30/21    Frequency    Min 3X/week      PT Plan      Co-evaluation              AM-PAC PT "6 Clicks" Mobility   Outcome Measure  Help needed turning from your back to your side while in a flat bed without using bedrails?: A Little Help needed moving from lying on your back to sitting on the side of a flat bed without using bedrails?: A Little Help needed moving to and from a bed to a chair (including a wheelchair)?: A Little Help needed standing up from a chair using your arms (e.g., wheelchair or bedside chair)?: A Little Help needed to walk in hospital room?: A Lot Help needed climbing 3-5 steps with a railing? : Total 6 Click Score: 15    End of Session Equipment Utilized During Treatment: Gait belt Activity Tolerance: Patient limited by fatigue Patient left: in bed;with call bell/phone within reach;with bed alarm set Nurse Communication: Mobility status PT Visit Diagnosis: Unsteadiness on feet (R26.81);Other abnormalities of gait and mobility (R26.89);History of falling (Z91.81)     Time: 4268-3419 PT Time Calculation (min) (ACUTE ONLY): 28 min  Charges:  $Gait Training: 23-37 mins           Rylyn Ranganathan R. PTA Acute Rehabilitation Services Office: [place] 11/20/2021, 3:09 PM

## 2021-11-21 DIAGNOSIS — N179 Acute kidney failure, unspecified: Secondary | ICD-10-CM | POA: Diagnosis not present

## 2021-11-21 LAB — BASIC METABOLIC PANEL
Anion gap: 8 (ref 5–15)
BUN: 32 mg/dL — ABNORMAL HIGH (ref 8–23)
CO2: 24 mmol/L (ref 22–32)
Calcium: 8.1 mg/dL — ABNORMAL LOW (ref 8.9–10.3)
Chloride: 105 mmol/L (ref 98–111)
Creatinine, Ser: 1.36 mg/dL — ABNORMAL HIGH (ref 0.61–1.24)
GFR, Estimated: 56 mL/min — ABNORMAL LOW (ref >60)
Glucose, Bld: 170 mg/dL — ABNORMAL HIGH (ref 70–99)
Potassium: 4.1 mmol/L (ref 3.5–5.1)
Sodium: 137 mmol/L (ref 135–145)

## 2021-11-21 LAB — GLUCOSE, CAPILLARY
Glucose-Capillary: 126 mg/dL — ABNORMAL HIGH (ref 70–99)
Glucose-Capillary: 141 mg/dL — ABNORMAL HIGH (ref 70–99)
Glucose-Capillary: 155 mg/dL — ABNORMAL HIGH (ref 70–99)
Glucose-Capillary: 275 mg/dL — ABNORMAL HIGH (ref 70–99)
Glucose-Capillary: 219 mg/dL — ABNORMAL HIGH (ref 70–99)

## 2021-11-21 LAB — PSA, TOTAL AND FREE
PSA, Free Pct: >9.5 %
PSA, Free: >50 ng/mL
Prostate Specific Ag, Serum: 527 ng/mL — ABNORMAL HIGH (ref 0.0–4.0)

## 2021-11-21 MED ORDER — SENNA 8.6 MG PO TABS
1.0000 | ORAL_TABLET | Freq: Every day | ORAL | Status: DC
Start: 2021-11-21 — End: 2021-11-25
  Administered 2021-11-21 – 2021-11-24 (×3): 8.6 mg via ORAL
  Filled 2021-11-21 (×4): qty 1

## 2021-11-21 MED ORDER — LISINOPRIL 20 MG PO TABS
20.0000 mg | ORAL_TABLET | Freq: Every day | ORAL | Status: DC
  Administered 2021-11-21 – 2021-11-25 (×5): 20 mg via ORAL
  Filled 2021-11-21 (×5): qty 1

## 2021-11-21 MED ORDER — POLYETHYLENE GLYCOL 3350 17 G PO PACK
17.0000 g | PACK | Freq: Every day | ORAL | Status: DC
Start: 2021-11-21 — End: 2021-11-25
  Administered 2021-11-21 – 2021-11-22 (×2): 17 g via ORAL
  Filled 2021-11-21 (×3): qty 1

## 2021-11-21 NOTE — Progress Notes (Signed)
FPTS Brief Note Reviewed patient's vitals, recent notes.  Vitals:   11/21/21 1609 11/21/21 2007  BP: (!) 142/62 (!) 124/52  Pulse: (!) 58 (!) 56  Resp: 19 20  Temp: 98.7 F (37.1 C) 99.3 F (37.4 C)  SpO2: 98% 98%   At this time, no change in plan from day progress note.  Donney Dice, DO Page [phone] with questions about this patient.

## 2021-11-21 NOTE — Progress Notes (Signed)
     Daily Progress Note Intern Pager: [phone]  Patient name: [name] Medical record number: [id number] Date of birth: 1951/01/02 Age: 71 y.o. Gender: male  Primary Care Provider: Donney Dice, DO Consultants: Nephrology Code Status: Full  Pt Overview and Major Events to Date:  6/16: Admitted, Foley placement 6/17: Nephrology consulted 6/20: Failed void trial, Foley placed back in  Assessment and Plan: [name] is a 71 year old male admitted with AKI and AMS.Marland Kitchen Pertinent PMH/PSH includes CVA, T2DM, HTN, DVT, PVD, CKD 2, HFpEF.   * Acute renal failure superimposed on stage 2 chronic kidney disease ([place]) Patient with postobstructive AKI.  Creatinine this morning is improved to 1.36. Medically stable and awaiting SNF placement  -Strict I's and O's -Avoid nephrotoxic agents -Daily BMP  Obstructive uropathy Secondary to prostatomegaly. Found to have significantly elevated PSA of 527 which is concerning for possible malignancy. His free PSA is greater than 50. Will follow up with urology at discharge. -Continue Flomax 0.'8mg'$  daily -Continue foley  -Recommend outpatient urology f/u  Diabetes mellitus, type II ([place]) CBG in 24hrs 126-233. Required 12u SAI.  Blood Glucose this morning was 126  - hold home metformin for AKI - Started on Jardiance '10mg'$  daily - sensitive SSI - CBG qAC and qHS  Essential hypertension BP in 24hrs 137-150/55-66. This morning 150/60. - Monitor BP with routine vitals - Continue amlodipine '5mg'$  - Continue home carvedilol -Restarted home lisinopril at lower dose '20mg'$  QD  - holding chlorthalidone (reported not taking) due to AKI   FEN/GI: DYS3 diet PPx: Heparin Dispo:SNF  awaiting placement .   Subjective:  [name] said he is doing well and has no complaint this morning.  Objective: Temp:  [97.7 F (36.5 C)-99.5 F (37.5 C)] 99.5 F (37.5 C) (06/22 0741) Pulse Rate:  [58-63] 58 (06/22 0415) Resp:  [16-20] 18 (06/22 0741) BP:  (137-151)/(55-65) 151/57 (06/22 0741) SpO2:  [96 %-100 %] 100 % (06/22 0415) Physical Exam: General: Alert, well appearing, NAD CV: RRR, no murmurs, normal S1/S2 Pulm: CTAB, good WOB on RA, no crackles or wheezing Ext: No BLE edema   Laboratory: Most recent CBC Lab Results  Component Value Date   WBC 10.1 11/19/2021   HGB 11.1 (L) 11/19/2021   HCT 35.5 (L) 11/19/2021   MCV 91.3 11/19/2021   PLT 187 11/19/2021   Most recent BMP    Latest Ref Rng & Units 11/21/2021   12:12 AM  BMP  Glucose 70 - 99 mg/dL 170   BUN 8 - 23 mg/dL 32   Creatinine 0.61 - 1.24 mg/dL 1.36   Sodium 135 - 145 mmol/L 137   Potassium 3.5 - 5.1 mmol/L 4.1   Chloride 98 - 111 mmol/L 105   CO2 22 - 32 mmol/L 24   Calcium 8.9 - 10.3 mg/dL 8.1     Imaging/Diagnostic Tests: No new images   Kyle Bleacher, MD 11/21/2021, 9:41 AM  PGY-1, [place] Intern pager: [phone], text pages welcome Secure chat group [name]

## 2021-11-21 NOTE — Progress Notes (Signed)
Speech Language Pathology Treatment: Dysphagia  Patient Details Name: [name] MRN: [id number] DOB: 1951/03/12 Today's Date: 11/21/2021 Time: 8850-2774 SLP Time Calculation (min) (ACUTE ONLY): 10 min  Assessment / Plan / Recommendation Clinical Impression  Pt seen with lunch tray. Pt reports he enjoys his meals, texture is appropriate. Pt likes that food is precut given hemiparesis. Pt does have some right facial weakness but clears right buccal cavity independently. Denies any globus. Will keep mech soft diet and sign off.   HPI HPI: Patient is a 71 y.o. male with PMH: CVA (2018) with residual right sided weakness and dysarthria, DM-2, DVT, HTN, PVD and acute chronic kidney rejection. He presented to the hospital on 11/15/21 from home after family found him in feces, urine and vomit. At baseline he lives by himself but per patient report, has had four days of weakness to the point that he was unable to get out of bed to use bathroom, progressive worsening in swallowing with both solids and liquids as well as choking and vomiting undigested food at times. UA WNL, UDS negative, CXR showed opacity in right lung base for which PNA could not be excluded, however patient without respiratory symptoms. CT head negative for acute intracranial abnormality. Patient admitted for sepsis with acute renal failure. He as made NPO awaiting SLP evaluation of swallow function.      SLP Plan  All goals met      Recommendations for follow up therapy are one component of a multi-disciplinary discharge planning process, led by the attending physician.  Recommendations may be updated based on patient status, additional functional criteria and insurance authorization.    Recommendations  Diet recommendations: Dysphagia 3 (mechanical soft);Thin liquid Liquids provided via: Cup;Straw Medication Administration: Whole meds with liquid Supervision: Patient able to self feed Compensations: Minimize environmental  distractions;Slow rate;Small sips/bites;Lingual sweep for clearance of pocketing Postural Changes and/or Swallow Maneuvers: Seated upright 90 degrees                Oral Care Recommendations: Oral care BID Follow Up Recommendations: No SLP follow up Plan: All goals met           [name], [name]  11/21/2021, 2:37 PM

## 2021-11-21 NOTE — Progress Notes (Signed)
FPTS Brief Note Reviewed patient's vitals, recent notes.  Vitals:   11/20/21 1934 11/21/21 0001  BP: (!) 137/55 (!) 146/65  Pulse: 60 (!) 58  Resp: 20 17  Temp: 97.7 F (36.5 C) 98.6 F (37 C)  SpO2: 98% 99%   At this time, no change in plan from day progress note.  Donney Dice, DO Page [phone] with questions about this patient.

## 2021-11-22 DIAGNOSIS — N179 Acute kidney failure, unspecified: Secondary | ICD-10-CM | POA: Diagnosis not present

## 2021-11-22 LAB — BASIC METABOLIC PANEL
Anion gap: 6 (ref 5–15)
BUN: 33 mg/dL — ABNORMAL HIGH (ref 8–23)
CO2: 23 mmol/L (ref 22–32)
Calcium: 8.2 mg/dL — ABNORMAL LOW (ref 8.9–10.3)
Chloride: 107 mmol/L (ref 98–111)
Creatinine, Ser: 1.45 mg/dL — ABNORMAL HIGH (ref 0.61–1.24)
GFR, Estimated: 52 mL/min — ABNORMAL LOW (ref >60)
Glucose, Bld: 234 mg/dL — ABNORMAL HIGH (ref 70–99)
Potassium: 4.3 mmol/L (ref 3.5–5.1)
Sodium: 136 mmol/L (ref 135–145)

## 2021-11-22 LAB — GLUCOSE, CAPILLARY
Glucose-Capillary: 204 mg/dL — ABNORMAL HIGH (ref 70–99)
Glucose-Capillary: 345 mg/dL — ABNORMAL HIGH (ref 70–99)
Glucose-Capillary: 290 mg/dL — ABNORMAL HIGH (ref 70–99)
Glucose-Capillary: 302 mg/dL — ABNORMAL HIGH (ref 70–99)

## 2021-11-23 DIAGNOSIS — N179 Acute kidney failure, unspecified: Secondary | ICD-10-CM | POA: Diagnosis not present

## 2021-11-23 LAB — GLUCOSE, CAPILLARY
Glucose-Capillary: 136 mg/dL — ABNORMAL HIGH (ref 70–99)
Glucose-Capillary: 388 mg/dL — ABNORMAL HIGH (ref 70–99)
Glucose-Capillary: 337 mg/dL — ABNORMAL HIGH (ref 70–99)
Glucose-Capillary: 179 mg/dL — ABNORMAL HIGH (ref 70–99)

## 2021-11-23 LAB — BASIC METABOLIC PANEL
Anion gap: 9 (ref 5–15)
BUN: 30 mg/dL — ABNORMAL HIGH (ref 8–23)
CO2: 23 mmol/L (ref 22–32)
Calcium: 8.4 mg/dL — ABNORMAL LOW (ref 8.9–10.3)
Chloride: 107 mmol/L (ref 98–111)
Creatinine, Ser: 1.53 mg/dL — ABNORMAL HIGH (ref 0.61–1.24)
GFR, Estimated: 49 mL/min — ABNORMAL LOW (ref >60)
Glucose, Bld: 160 mg/dL — ABNORMAL HIGH (ref 70–99)
Potassium: 4.2 mmol/L (ref 3.5–5.1)
Sodium: 139 mmol/L (ref 135–145)

## 2021-11-23 NOTE — Progress Notes (Signed)
     Daily Progress Note Intern Pager: [phone]  Patient name: [name] Medical record number: [id number] Date of birth: 11-28-1950 Age: 71 y.o. Gender: male  Primary Care Provider: Reece Leader, DO Consultants: Nephrology Code Status: Full Code  Pt Overview and Major Events to Date:  6/16: Admitted 6/17: Nephrology consulted 6/20: Failed repeat void trial, foley placement   Assessment and Plan: [name] is a 71 y.o. male admitted for AMS (resolved) and AKI, now awaiting placement for SNF. Pertinent PMH/PSH includes T2DM, HTN, PVD, CKD stage II, prior CVA, HFpEF.   * Acute renal failure superimposed on stage 2 chronic kidney disease (H[facility]) Creatinine stable. Patient is medically stable for discharge to SNF and pending placement. -monitor daily BMP  -Strict I/Os -avoid nephrotoxic agents  Obstructive uropathy Elevated PSA in conjunction with prostatemegaly. Stable. -Foley in place, continue -Continue Flomax 0.8mg  daily -Outpatient urology follow up scheduled   Diabetes mellitus, type II (HCC) Chronic and stable. CBGs ranging between 130-388.  -mSSI -continue jardiance 10 mg daily -continue to hold metformin -CBG qAC and qHS  Essential hypertension Chronic, stable. Largely normotensive. -amlodipine 5 mg daily  -coreg 25 mg bid  -lisinopril 20 mg daily, consider holding if worsening renal function -continue to hold chlorthalidone  -monitor BP    FEN/GI: Dysphagia 3 diet PPx: Heparin subcutaneous Dispo:SNF  pending placement .  Subjective:  Patient has no concerns at this time and is overall doing well.   Objective: Temp:  [98.7 F (37.1 C)-99.3 F (37.4 C)] 98.7 F (37.1 C) (06/25 0405) Pulse Rate:  [57-65] 59 (06/25 0405) Resp:  [16-18] 18 (06/25 0405) BP: (112-145)/(48-57) 145/54 (06/25 0405) SpO2:  [98 %-100 %] 98 % (06/25 0405) Physical Exam: General: NAD, supine in bed, resting comfortably Cardiovascular: RRR, no m/r/g  auscultated Respiratory: CTAB, breathing comfortably on room air Abdomen: soft, non-tender, non-distended Extremities: no edema, moves all within normal limits  Laboratory: Most recent CBC Lab Results  Component Value Date   WBC 10.1 11/19/2021   HGB 11.1 (L) 11/19/2021   HCT 35.5 (L) 11/19/2021   MCV 91.3 11/19/2021   PLT 187 11/19/2021   Most recent BMP    Latest Ref Rng & Units 11/24/2021   12:58 AM  BMP  Glucose 70 - 99 mg/dL 244   BUN 8 - 23 mg/dL 33   Creatinine 0.10 - 1.24 mg/dL 2.72   Sodium 536 - 644 mmol/L 138   Potassium 3.5 - 5.1 mmol/L 4.9   Chloride 98 - 111 mmol/L 110   CO2 22 - 32 mmol/L 22   Calcium 8.9 - 10.3 mg/dL 8.2     Imaging/Diagnostic Tests: No recent imaging  Evelena Leyden, DO 11/24/2021, 5:46 AM PGY-2, [place] [facility] FPTS Intern pager: [phone], text pages welcome Secure chat group [facility] Teaching Service

## 2021-11-23 NOTE — TOC Progression Note (Signed)
Transition of Care [facility]) - Progression Note    Patient Details  Name: [name] MRN: [id number] Date of Birth: April 09, 1951  [facility]) CM/SW Contact  Patrice Paradise, LCSW Phone Number:[phone] 11/23/2021, 3:37 PM  Clinical Narrative:     CSW attempted to reach facility in regards to authorization. CSW had to leave message. Pt is waiting on  Drucie Opitz which may not come back this weekend.  TOC team will continue to assist with discharge planning needs.   Expected Discharge Plan: Skilled Nursing Facility Barriers to Discharge: Continued Medical Work up  Expected Discharge Plan and Services Expected Discharge Plan: Skilled Nursing Facility In-house Referral: Clinical Social Work     Living arrangements for the past 2 months: Single Family Home                                       Social Determinants of Health (SDOH) Interventions    Readmission Risk Interventions     No data to display

## 2021-11-24 DIAGNOSIS — N179 Acute kidney failure, unspecified: Secondary | ICD-10-CM | POA: Diagnosis not present

## 2021-11-24 LAB — BASIC METABOLIC PANEL
Anion gap: 6 (ref 5–15)
BUN: 33 mg/dL — ABNORMAL HIGH (ref 8–23)
CO2: 22 mmol/L (ref 22–32)
Calcium: 8.2 mg/dL — ABNORMAL LOW (ref 8.9–10.3)
Chloride: 110 mmol/L (ref 98–111)
Creatinine, Ser: 1.45 mg/dL — ABNORMAL HIGH (ref 0.61–1.24)
GFR, Estimated: 52 mL/min — ABNORMAL LOW (ref >60)
Glucose, Bld: 130 mg/dL — ABNORMAL HIGH (ref 70–99)
Potassium: 4.9 mmol/L (ref 3.5–5.1)
Sodium: 138 mmol/L (ref 135–145)

## 2021-11-24 LAB — GLUCOSE, CAPILLARY
Glucose-Capillary: 139 mg/dL — ABNORMAL HIGH (ref 70–99)
Glucose-Capillary: 312 mg/dL — ABNORMAL HIGH (ref 70–99)
Glucose-Capillary: 367 mg/dL — ABNORMAL HIGH (ref 70–99)
Glucose-Capillary: 225 mg/dL — ABNORMAL HIGH (ref 70–99)

## 2021-11-24 NOTE — Progress Notes (Addendum)
Md on call made aware of patients blood sugars trending up today. No new orders received at this time. Loraine Freid, [place] An RN

## 2021-11-24 NOTE — Progress Notes (Signed)
FPTS Brief Note Reviewed patient's vitals, recent notes.  Vitals:   11/24/21 1603 11/24/21 1945  BP: (!) 127/59 (!) 104/47  Pulse: 68 (!) 56  Resp:  20  Temp: 98.4 F (36.9 C) 98.5 F (36.9 C)  SpO2: 95% 100%   At this time, no change in plan from day progress note.  Sabino Dick, DO Page [phone] with questions about this patient.

## 2021-11-25 DIAGNOSIS — Z7401 Bed confinement status: Secondary | ICD-10-CM | POA: Diagnosis not present

## 2021-11-25 DIAGNOSIS — R3914 Feeling of incomplete bladder emptying: Secondary | ICD-10-CM | POA: Diagnosis not present

## 2021-11-25 DIAGNOSIS — R319 Hematuria, unspecified: Secondary | ICD-10-CM | POA: Diagnosis not present

## 2021-11-25 DIAGNOSIS — Z743 Need for continuous supervision: Secondary | ICD-10-CM | POA: Diagnosis not present

## 2021-11-25 DIAGNOSIS — Z7984 Long term (current) use of oral hypoglycemic drugs: Secondary | ICD-10-CM | POA: Diagnosis not present

## 2021-11-25 DIAGNOSIS — E559 Vitamin D deficiency, unspecified: Secondary | ICD-10-CM | POA: Diagnosis not present

## 2021-11-25 DIAGNOSIS — Z7902 Long term (current) use of antithrombotics/antiplatelets: Secondary | ICD-10-CM | POA: Diagnosis not present

## 2021-11-25 DIAGNOSIS — N401 Enlarged prostate with lower urinary tract symptoms: Secondary | ICD-10-CM | POA: Diagnosis not present

## 2021-11-25 DIAGNOSIS — N179 Acute kidney failure, unspecified: Secondary | ICD-10-CM | POA: Diagnosis not present

## 2021-11-25 DIAGNOSIS — Z79899 Other long term (current) drug therapy: Secondary | ICD-10-CM | POA: Diagnosis not present

## 2021-11-25 DIAGNOSIS — R972 Elevated prostate specific antigen [PSA]: Secondary | ICD-10-CM | POA: Diagnosis not present

## 2021-11-25 DIAGNOSIS — R3912 Poor urinary stream: Secondary | ICD-10-CM | POA: Diagnosis not present

## 2021-11-25 DIAGNOSIS — R338 Other retention of urine: Secondary | ICD-10-CM | POA: Diagnosis not present

## 2021-11-25 DIAGNOSIS — R5381 Other malaise: Secondary | ICD-10-CM | POA: Diagnosis not present

## 2021-11-25 DIAGNOSIS — R5383 Other fatigue: Secondary | ICD-10-CM | POA: Diagnosis not present

## 2021-11-25 DIAGNOSIS — I10 Essential (primary) hypertension: Secondary | ICD-10-CM | POA: Diagnosis not present

## 2021-11-25 DIAGNOSIS — M2550 Pain in unspecified joint: Secondary | ICD-10-CM | POA: Diagnosis not present

## 2021-11-25 DIAGNOSIS — N139 Obstructive and reflux uropathy, unspecified: Secondary | ICD-10-CM | POA: Diagnosis not present

## 2021-11-25 DIAGNOSIS — E119 Type 2 diabetes mellitus without complications: Secondary | ICD-10-CM | POA: Diagnosis not present

## 2021-11-25 DIAGNOSIS — E1165 Type 2 diabetes mellitus with hyperglycemia: Secondary | ICD-10-CM | POA: Diagnosis not present

## 2021-11-25 LAB — GLUCOSE, CAPILLARY
Glucose-Capillary: 142 mg/dL — ABNORMAL HIGH (ref 70–99)
Glucose-Capillary: 338 mg/dL — ABNORMAL HIGH (ref 70–99)
Glucose-Capillary: 370 mg/dL — ABNORMAL HIGH (ref 70–99)

## 2021-11-25 MED ORDER — METFORMIN HCL 500 MG PO TABS: 1000.0000 mg | ORAL_TABLET | Freq: Two times a day (BID) | ORAL | Status: DC

## 2021-11-25 MED ORDER — POLYETHYLENE GLYCOL 3350 17 G PO PACK: 17.0000 g | PACK | Freq: Every day | ORAL | 0 refills | Status: DC | PRN

## 2021-11-25 MED ORDER — EMPAGLIFLOZIN 10 MG PO TABS: 10.0000 mg | ORAL_TABLET | Freq: Every day | ORAL | Status: DC

## 2021-11-25 MED ORDER — LISINOPRIL 20 MG PO TABS: 40.0000 mg | ORAL_TABLET | Freq: Every day | ORAL | Status: DC

## 2021-11-25 MED ORDER — SENNA 8.6 MG PO TABS: 1.0000 | ORAL_TABLET | Freq: Every day | ORAL | 0 refills | Status: DC | PRN

## 2021-11-25 MED ORDER — TAMSULOSIN HCL 0.4 MG PO CAPS: 0.8000 mg | ORAL_CAPSULE | Freq: Every day | ORAL | Status: DC

## 2021-11-25 NOTE — TOC Progression Note (Signed)
Transition of Care [facility]) - Progression Note    Patient Details  Name: [name] MRN: [id number] Date of Birth: Oct 28, 1950  Transition of Care [facility]) CM/SW Contact  Eduard Roux, [place] Phone Number: 11/25/2021, 8:38 AM  Clinical Narrative:     Insurance pending   Expected Discharge Plan: Skilled Nursing Facility Barriers to Discharge: Continued Medical Work up  Expected Discharge Plan and Services Expected Discharge Plan: Skilled Nursing Facility In-house Referral: Clinical Social Work     Living arrangements for the past 2 months: Single Family Home                                       Social Determinants of Health (SDOH) Interventions    Readmission Risk Interventions     No data to display

## 2021-11-25 NOTE — Discharge Summary (Addendum)
Family Medicine Teaching [facility] Discharge Summary  Patient name: [name] Medical record number: [id number] Date of birth: [birth year] Age: 71 y.o. Gender: male Date of Admission: 11/15/2021  Date of Discharge: 11/25/2021 Admitting Physician: Doreene Eland, MD  Primary Care Provider: Reece Leader, DO Consultants: Nephrology  Indication for Hospitalization: AMS and AKI  Discharge Diagnoses/Problem List:  Acute renal failure superimposed on CKD Stage II Obstructive uropathy T2DM HTN  Disposition: SNF  Discharge Condition: Stable  Diet: Dysphagia 3  Discharge Exam: Per progress note: "Patient sitting up in bedside chair eating breakfast.  He reports no acute concerns or complaints, and feels ready to discharge to SNF.   Objective: Temp:  [98.4 F (36.9 C)-99 F (37.2 C)] 99 F (37.2 C) (06/26 1106) Pulse Rate:  [55-70] 55 (06/26 1106) Resp:  [17-20] 17 (06/26 1106) BP: (104-148)/(47-59) 129/50 (06/26 1106) SpO2:  [94 %-100 %] 97 % (06/26 1106) Physical Exam: General: Sitting up in bedside chair in NAD Cardiovascular: RRR, no murmur/rub/gallops Respiratory: CTA x2; normal WOB on room air Abdomen: Soft, NT/ND.  NABS Extremities: Mild pretibial edema of right lower extremity (chronic), but nonedematous LLE."  Brief Hospital Course:  [name] is a 71 y.o. male who was admitted to the [facility] Teaching Service at [facility] for AMS and AKI. Hospital course is outlined below by system.   Postrenal AKI 2/2 Prostatomegaly Patient presented to the ED due to altered mental status.  His initial labs showed a creatinine of 6.64, BUN 84 and potassium of 5.9. Renal ultrasound showed mild bilateral hydronephrosis bladder scan showed 788 mm urine retained.  His lab result and image findings was consistent with postrenal AKI and foley was placed. With fluid resuscitation and and foley placement patient's AMS improved. He was started on Flomax 0.4 mg and titrated up  to 0.8 mg after failing void trail. PSA level was elevated to 524 and concerning for malignancy. Plan is to leave foley in until patient follow up with urology outpatient.   HTN Home chlorthalidone discontinued, and other home antihypertensives resumed. BP was largely normotensive on this regimen.   T2DM Patient's Metformin initially held due to AKI but resumed as creatinine improved. SSI discontinued prior to discharge. Advise daily CBG monitoring while at SNF.   PCP follow-up recommendations Started patient on 0.8 mg Flomax Foley catheter in place until follow-up at [facility] scheduled 12/02/21 at 10:30 AM with Dr. Arita Miss. Metformin held due to poor kidney function.  Started on Jardiance 10mg  daily for glycemic control. Metformin resumed on day of discharge as renal function improved.     Significant Procedures: N/A  Significant Labs and Imaging:  Recent Labs  Lab 11/19/21 0223  WBC 10.1  HGB 11.1*  HCT 35.5*  PLT 187   Recent Labs  Lab 11/19/21 0223 11/20/21 0100 11/21/21 0012 11/22/21 0231 11/23/21 0219 11/24/21 0058  NA 138 140 137 136 139 138  K 4.6 4.2 4.1 4.3 4.2 4.9  CL 107 109 105 107 107 110  CO2 24 21* 24 23 23 22   GLUCOSE 158* 194* 170* 234* 160* 130*  BUN 21 29* 32* 33* 30* 33*  CREATININE 1.28* 1.43* 1.36* 1.45* 1.53* 1.45*  CALCIUM 8.2* 8.1* 8.1* 8.2* 8.4* 8.2*  PHOS 2.3*  --   --   --   --   --   ALBUMIN 2.5*  --   --   --   --   --      Results/Tests Pending at Time  of Discharge: N/A  Discharge Medications:  Allergies as of 11/25/2021       Reactions   Pork Allergy    Shrimp Flavor Other (See Comments)   Doesn't like it        Medication List     STOP taking these medications    chlorthalidone 25 MG tablet Commonly known as: HYGROTON       TAKE these medications    amLODipine 5 MG tablet Commonly known as: NORVASC TAKE 1 TABLET BY MOUTH EVERYDAY AT BEDTIME What changed: See the new instructions.   atorvastatin 40 MG  tablet Commonly known as: LIPITOR Take 1 tablet (40 mg total) by mouth daily.   Blood Pressure Monitor Devi 1 Units by Does not apply route daily. Check  blood pressure once daily, record readings, bring to doctor's office.   carvedilol 25 MG tablet Commonly known as: COREG TAKE 1 TABLET BY MOUTH TWICE A DAY WITH MEALS   CAYENNE PEPPER PO Take 1 tablet by mouth at bedtime.   clopidogrel 75 MG tablet Commonly known as: PLAVIX Take 1 tablet (75 mg total) by mouth daily.   empagliflozin 10 MG Tabs tablet Commonly known as: JARDIANCE Take 1 tablet (10 mg total) by mouth daily. Start taking on: November 26, 2021   Insulin Pen Needle 31G X 8 MM Misc Commonly known as: B-D ULTRAFINE III SHORT PEN USE WITH LEVEMIR   Insulin Syringe-Needle U-100 28G X 1/2" 1 ML Misc 1 each by Does not apply route 3 (three) times daily.   lisinopril 20 MG tablet Commonly known as: ZESTRIL Take 2 tablets (40 mg total) by mouth daily. What changed: medication strength   metFORMIN 1000 MG tablet Commonly known as: GLUCOPHAGE TAKE 1 TABLET (1,000 MG TOTAL) BY MOUTH 2 (TWO) TIMES DAILY WITH A MEAL.   polyethylene glycol 17 g packet Commonly known as: MIRALAX / GLYCOLAX Take 17 g by mouth daily as needed.   senna 8.6 MG Tabs tablet Commonly known as: SENOKOT Take 1 tablet (8.6 mg total) by mouth daily as needed for mild constipation.   sertraline 100 MG tablet Commonly known as: ZOLOFT TAKE 1 TABLET BY MOUTH EVERY DAY   Sidekick Blood Glucose System Devi Use for daily testing   tamsulosin 0.4 MG Caps capsule Commonly known as: FLOMAX Take 2 capsules (0.8 mg total) by mouth daily. Start taking on: November 26, 2021        Discharge Instructions: Please refer to Patient Instructions section of EMR for full details.  Patient was counseled important signs and symptoms that should prompt return to medical care, changes in medications, dietary instructions, activity restrictions, and follow up  appointments.   Follow-Up Appointments:  Contact information for follow-up providers     Noel Christmas, MD. Go on 12/02/2021.   Specialty: Urology Why: at 10:30 AM Contact information: [address] [place] [address] [phone]              Contact information for after-discharge care     Destination     [facility] .   Service: Skilled Nursing Contact information: [address] [place] [address] [phone]                     Lamar Sprinkles, MD 11/25/2021, 11:59 AM PGY-1, [facility] Family Medicine

## 2021-11-28 DIAGNOSIS — R972 Elevated prostate specific antigen [PSA]: Secondary | ICD-10-CM | POA: Diagnosis not present

## 2021-11-28 DIAGNOSIS — I10 Essential (primary) hypertension: Secondary | ICD-10-CM | POA: Diagnosis not present

## 2021-11-28 DIAGNOSIS — E119 Type 2 diabetes mellitus without complications: Secondary | ICD-10-CM | POA: Diagnosis not present

## 2021-11-28 DIAGNOSIS — R5381 Other malaise: Secondary | ICD-10-CM | POA: Diagnosis not present

## 2021-11-28 DIAGNOSIS — N139 Obstructive and reflux uropathy, unspecified: Secondary | ICD-10-CM | POA: Diagnosis not present

## 2021-11-28 DIAGNOSIS — N179 Acute kidney failure, unspecified: Secondary | ICD-10-CM | POA: Diagnosis not present

## 2021-12-03 ENCOUNTER — Other Ambulatory Visit: Payer: Self-pay | Admitting: Family Medicine

## 2021-12-09 DIAGNOSIS — R3912 Poor urinary stream: Secondary | ICD-10-CM | POA: Diagnosis not present

## 2021-12-09 DIAGNOSIS — R338 Other retention of urine: Secondary | ICD-10-CM | POA: Diagnosis not present

## 2021-12-09 DIAGNOSIS — N401 Enlarged prostate with lower urinary tract symptoms: Secondary | ICD-10-CM | POA: Diagnosis not present

## 2021-12-09 DIAGNOSIS — R3914 Feeling of incomplete bladder emptying: Secondary | ICD-10-CM | POA: Diagnosis not present

## 2021-12-10 ENCOUNTER — Other Ambulatory Visit: Payer: Self-pay

## 2021-12-10 ENCOUNTER — Encounter (HOSPITAL_COMMUNITY): Payer: Self-pay

## 2021-12-10 ENCOUNTER — Emergency Department (HOSPITAL_COMMUNITY)
Admission: EM | Admit: 2021-12-10 | Discharge: 2021-12-11 | Disposition: A | Discharge status: Home / Self Care | Payer: Medicare HMO | Attending: Emergency Medicine | Admitting: Emergency Medicine

## 2021-12-10 ENCOUNTER — Emergency Department (HOSPITAL_COMMUNITY): Payer: Medicare HMO

## 2021-12-10 DIAGNOSIS — R319 Hematuria, unspecified: Secondary | ICD-10-CM | POA: Insufficient documentation

## 2021-12-10 DIAGNOSIS — Z7902 Long term (current) use of antithrombotics/antiplatelets: Secondary | ICD-10-CM | POA: Insufficient documentation

## 2021-12-10 DIAGNOSIS — Z79899 Other long term (current) drug therapy: Secondary | ICD-10-CM | POA: Insufficient documentation

## 2021-12-10 DIAGNOSIS — Z743 Need for continuous supervision: Secondary | ICD-10-CM | POA: Diagnosis not present

## 2021-12-10 DIAGNOSIS — I10 Essential (primary) hypertension: Secondary | ICD-10-CM | POA: Insufficient documentation

## 2021-12-10 DIAGNOSIS — Z7984 Long term (current) use of oral hypoglycemic drugs: Secondary | ICD-10-CM | POA: Insufficient documentation

## 2021-12-10 DIAGNOSIS — E119 Type 2 diabetes mellitus without complications: Secondary | ICD-10-CM | POA: Insufficient documentation

## 2021-12-10 LAB — CBC WITH DIFFERENTIAL/PLATELET
Abs Immature Granulocytes: 0.04 10*3/uL (ref 0.00–0.07)
Basophils Absolute: 0 10*3/uL (ref 0.0–0.1)
Basophils Relative: 0 %
Eosinophils Absolute: 0.1 10*3/uL (ref 0.0–0.5)
Eosinophils Relative: 1 %
HCT: 36.4 % — ABNORMAL LOW (ref 39.0–52.0)
Hemoglobin: 11.3 g/dL — ABNORMAL LOW (ref 13.0–17.0)
Immature Granulocytes: 0 %
Lymphocytes Relative: 18 %
Lymphs Abs: 1.9 10*3/uL (ref 0.7–4.0)
MCH: 28.4 pg (ref 26.0–34.0)
MCHC: 31 g/dL (ref 30.0–36.0)
MCV: 91.5 fL (ref 80.0–100.0)
Monocytes Absolute: 0.5 10*3/uL (ref 0.1–1.0)
Monocytes Relative: 5 %
Neutro Abs: 7.6 10*3/uL (ref 1.7–7.7)
Neutrophils Relative %: 76 %
Platelets: 148 10*3/uL — ABNORMAL LOW (ref 150–400)
RBC: 3.98 MIL/uL — ABNORMAL LOW (ref 4.22–5.81)
RDW: 13.2 % (ref 11.5–15.5)
WBC: 10.2 10*3/uL (ref 4.0–10.5)
nRBC: 0 % (ref 0.0–0.2)

## 2021-12-10 LAB — BASIC METABOLIC PANEL
Anion gap: 6 (ref 5–15)
BUN: 19 mg/dL (ref 8–23)
CO2: 26 mmol/L (ref 22–32)
Calcium: 8.7 mg/dL — ABNORMAL LOW (ref 8.9–10.3)
Chloride: 106 mmol/L (ref 98–111)
Creatinine, Ser: 1.13 mg/dL (ref 0.61–1.24)
GFR, Estimated: >60 mL/min (ref >60)
Glucose, Bld: 166 mg/dL — ABNORMAL HIGH (ref 70–99)
Potassium: 4.8 mmol/L (ref 3.5–5.1)
Sodium: 138 mmol/L (ref 135–145)

## 2021-12-10 MED ORDER — SODIUM CHLORIDE 0.9 % IV BOLUS: 1000.0000 mL | Freq: Once | INTRAVENOUS | Status: DC

## 2021-12-10 NOTE — ED Notes (Signed)
3 way foley inserted, amber prior to insertion, after insertion bag 1 of CBI started at slow rate with pink clear urine.

## 2021-12-10 NOTE — ED Triage Notes (Signed)
BIBA from [place], foley was replaced today and hematuria a 1 hour after reinsertion. Foley since may for enlarged prostate per patient.

## 2021-12-10 NOTE — ED Provider Notes (Signed)
[facility] DEPT Provider Note   CSN: [id number] Arrival date & time: 12/10/21  2047     History {Add pertinent medical, surgical, social history, OB history to HPI:1} Chief Complaint  Patient presents with   Hematuria    [name] is a 71 y.o. male.   Hematuria    Patient with medical history of PAD, hypertension, diabetes, CVA presents today due to Foley catheter issue.  Patient states she has been having the Foley catheter in for about a month, states he had the catheter changed out today and has been having hematuria since then.  There is some pain along the insertion site on his penis, denies any abdominal pain.  Not having any fevers, nausea, vomiting, abdominal pain.  Home Medications Prior to Admission medications   Medication Sig Start Date End Date Taking? Authorizing Provider  amLODipine (NORVASC) 5 MG tablet TAKE 1 TABLET BY MOUTH EVERYDAY AT BEDTIME Patient taking differently: Take 5 mg by mouth daily. 09/03/21   Ganta, Anupa, DO  atorvastatin (LIPITOR) 40 MG tablet Take 1 tablet (40 mg total) by mouth daily. 12/18/20   Donney Dice, DO  Blood Gluc Meter Disp-Strips (SIDEKICK BLOOD GLUCOSE SYSTEM) DEVI Use for daily testing 08/06/16   Lauree Chandler, NP  Blood Pressure Monitor DEVI 1 Units by Does not apply route daily. Check  blood pressure once daily, record readings, bring to doctor's office. 11/27/20   Daisy Floro, DO  Capsicum, Cayenne, (CAYENNE PEPPER PO) Take 1 tablet by mouth at bedtime.    [provider]  carvedilol (COREG) 25 MG tablet TAKE 1 TABLET BY MOUTH TWICE A DAY WITH MEALS 12/04/21   Ganta, Anupa, DO  clopidogrel (PLAVIX) 75 MG tablet Take 1 tablet (75 mg total) by mouth daily. 07/08/21   Ganta, Anupa, DO  empagliflozin (JARDIANCE) 10 MG TABS tablet Take 1 tablet (10 mg total) by mouth daily. 11/26/21   Alcus Dad, MD  Insulin Pen Needle (B-D ULTRAFINE III SHORT PEN) 31G X 8 MM MISC USE WITH LEVEMIR  08/06/16   Lauree Chandler, NP  Insulin Syringe-Needle U-100 28G X 1/2" 1 ML MISC 1 each by Does not apply route 3 (three) times daily. 08/06/16   Lauree Chandler, NP  lisinopril (ZESTRIL) 20 MG tablet Take 2 tablets (40 mg total) by mouth daily. 11/25/21   Alcus Dad, MD  metFORMIN (GLUCOPHAGE) 1000 MG tablet TAKE 1 TABLET (1,000 MG TOTAL) BY MOUTH 2 (TWO) TIMES DAILY WITH A MEAL. 06/13/20   Daisy Floro, DO  polyethylene glycol (MIRALAX / GLYCOLAX) 17 g packet Take 17 g by mouth daily as needed. 11/25/21   Alcus Dad, MD  senna (SENOKOT) 8.6 MG TABS tablet Take 1 tablet (8.6 mg total) by mouth daily as needed for mild constipation. 11/25/21   Alcus Dad, MD  sertraline (ZOLOFT) 100 MG tablet TAKE 1 TABLET BY MOUTH EVERY DAY Patient taking differently: Take 100 mg by mouth daily. 04/08/21   Ganta, Anupa, DO  tamsulosin (FLOMAX) 0.4 MG CAPS capsule Take 2 capsules (0.8 mg total) by mouth daily. 11/26/21   Alcus Dad, MD      Allergies    Pork allergy and Shrimp flavor    Review of Systems   Review of Systems  Genitourinary:  Positive for hematuria.    Physical Exam Updated Vital Signs BP (!) 201/94 (BP Location: Left Arm)   Pulse 93   Temp 98.5 F (36.9 C) (Oral)   Ht '5\' 7"'$  (  1.702 m)   Wt 94 kg   SpO2 98%   BMI 32.46 kg/m  Physical Exam Vitals and nursing note reviewed. Exam conducted with a chaperone present.  Constitutional:      Appearance: Normal appearance.  HENT:     Head: Normocephalic and atraumatic.  Eyes:     General: No scleral icterus.       Right eye: No discharge.        Left eye: No discharge.     Extraocular Movements: Extraocular movements intact.     Pupils: Pupils are equal, round, and reactive to light.  Cardiovascular:     Rate and Rhythm: Normal rate and regular rhythm.     Pulses: Normal pulses.     Heart sounds: Normal heart sounds. No murmur heard.    No friction rub. No gallop.  Pulmonary:     Effort: Pulmonary effort is  normal. No respiratory distress.     Breath sounds: Normal breath sounds.  Abdominal:     General: Abdomen is flat. Bowel sounds are normal. There is no distension.     Palpations: Abdomen is soft.     Tenderness: There is no abdominal tenderness.  Genitourinary:    Comments: Foley catheter in place, bloody urine in catheter bag Skin:    General: Skin is warm and dry.     Coloration: Skin is not jaundiced.  Neurological:     Mental Status: He is alert. Mental status is at baseline.     Coordination: Coordination normal.     ED Results / Procedures / Treatments   Labs (all labs ordered are listed, but only abnormal results are displayed) Labs Reviewed  URINE CULTURE  URINALYSIS, ROUTINE W REFLEX MICROSCOPIC  CBC WITH DIFFERENTIAL/PLATELET  BASIC METABOLIC PANEL    EKG None  Radiology No results found.  Procedures Procedures  {Document cardiac monitor, telemetry assessment procedure when appropriate:1}  Medications Ordered in ED Medications - No data to display  ED Course/ Medical Decision Making/ A&P                           Medical Decision Making Amount and/or Complexity of Data Reviewed Labs: ordered. Radiology: ordered.   ***  {Document critical care time when appropriate:1} {Document review of labs and clinical decision tools ie heart score, Chads2Vasc2 etc:1}  {Document your independent review of radiology images, and any outside records:1} {Document your discussion with family members, caretakers, and with consultants:1} {Document social determinants of health affecting pt's care:1} {Document your decision making why or why not admission, treatments were needed:1} Final Clinical Impression(s) / ED Diagnoses Final diagnoses:  None    Rx / DC Orders ED Discharge Orders     None

## 2021-12-11 DIAGNOSIS — Z7401 Bed confinement status: Secondary | ICD-10-CM | POA: Diagnosis not present

## 2021-12-11 DIAGNOSIS — R69 Illness, unspecified: Secondary | ICD-10-CM | POA: Diagnosis not present

## 2021-12-11 DIAGNOSIS — M2550 Pain in unspecified joint: Secondary | ICD-10-CM | POA: Diagnosis not present

## 2021-12-11 DIAGNOSIS — E785 Hyperlipidemia, unspecified: Secondary | ICD-10-CM | POA: Diagnosis not present

## 2021-12-11 DIAGNOSIS — N179 Acute kidney failure, unspecified: Secondary | ICD-10-CM | POA: Diagnosis not present

## 2021-12-11 DIAGNOSIS — E1165 Type 2 diabetes mellitus with hyperglycemia: Secondary | ICD-10-CM | POA: Diagnosis not present

## 2021-12-11 DIAGNOSIS — R5381 Other malaise: Secondary | ICD-10-CM | POA: Diagnosis not present

## 2021-12-11 DIAGNOSIS — E119 Type 2 diabetes mellitus without complications: Secondary | ICD-10-CM | POA: Diagnosis not present

## 2021-12-11 DIAGNOSIS — I10 Essential (primary) hypertension: Secondary | ICD-10-CM | POA: Diagnosis not present

## 2021-12-11 DIAGNOSIS — R319 Hematuria, unspecified: Secondary | ICD-10-CM | POA: Diagnosis not present

## 2021-12-11 DIAGNOSIS — R972 Elevated prostate specific antigen [PSA]: Secondary | ICD-10-CM | POA: Diagnosis not present

## 2021-12-11 DIAGNOSIS — N139 Obstructive and reflux uropathy, unspecified: Secondary | ICD-10-CM | POA: Diagnosis not present

## 2021-12-11 LAB — URINALYSIS, ROUTINE W REFLEX MICROSCOPIC
Bacteria, UA: NONE SEEN
Bilirubin Urine: NEGATIVE
Glucose, UA: >=500 mg/dL — AB
Ketones, ur: NEGATIVE mg/dL
Nitrite: NEGATIVE
Protein, ur: 100 mg/dL — AB
RBC / HPF: >50 RBC/hpf — ABNORMAL HIGH (ref 0–5)
Specific Gravity, Urine: 1.009 (ref 1.005–1.030)
pH: 6 (ref 5.0–8.0)

## 2021-12-11 LAB — URINE CULTURE: Culture: NO GROWTH

## 2021-12-11 NOTE — ED Notes (Signed)
Report called to [place]ak[place] at [facility]

## 2021-12-11 NOTE — Discharge Instructions (Signed)
Your catheter was changed again today, it will need to be changed again in about a month.  If you grow an infection in your urine you will be assigned antibiotics.  Please follow-up with your urologist, the Foley catheter need to be changed in a month. Return for fever worsening or new symptoms.

## 2021-12-11 NOTE — ED Notes (Signed)
CBI stopped at 0040 with clear pink tinged urine

## 2021-12-11 NOTE — ED Notes (Signed)
Called [facility] for discharge

## 2021-12-12 DIAGNOSIS — R319 Hematuria, unspecified: Secondary | ICD-10-CM | POA: Diagnosis not present

## 2021-12-12 DIAGNOSIS — N179 Acute kidney failure, unspecified: Secondary | ICD-10-CM | POA: Diagnosis not present

## 2021-12-12 DIAGNOSIS — I10 Essential (primary) hypertension: Secondary | ICD-10-CM | POA: Diagnosis not present

## 2021-12-12 DIAGNOSIS — N139 Obstructive and reflux uropathy, unspecified: Secondary | ICD-10-CM | POA: Diagnosis not present

## 2021-12-12 DIAGNOSIS — R5381 Other malaise: Secondary | ICD-10-CM | POA: Diagnosis not present

## 2021-12-12 DIAGNOSIS — R972 Elevated prostate specific antigen [PSA]: Secondary | ICD-10-CM | POA: Diagnosis not present

## 2021-12-20 DIAGNOSIS — E119 Type 2 diabetes mellitus without complications: Secondary | ICD-10-CM | POA: Diagnosis not present

## 2021-12-20 DIAGNOSIS — R69 Illness, unspecified: Secondary | ICD-10-CM | POA: Diagnosis not present

## 2021-12-20 DIAGNOSIS — N139 Obstructive and reflux uropathy, unspecified: Secondary | ICD-10-CM | POA: Diagnosis not present

## 2021-12-20 DIAGNOSIS — E785 Hyperlipidemia, unspecified: Secondary | ICD-10-CM | POA: Diagnosis not present

## 2021-12-20 DIAGNOSIS — I10 Essential (primary) hypertension: Secondary | ICD-10-CM | POA: Diagnosis not present

## 2021-12-23 DIAGNOSIS — M6281 Muscle weakness (generalized): Secondary | ICD-10-CM | POA: Diagnosis not present

## 2021-12-23 DIAGNOSIS — R471 Dysarthria and anarthria: Secondary | ICD-10-CM | POA: Diagnosis not present

## 2021-12-23 DIAGNOSIS — N179 Acute kidney failure, unspecified: Secondary | ICD-10-CM | POA: Diagnosis not present

## 2021-12-23 DIAGNOSIS — R1312 Dysphagia, oropharyngeal phase: Secondary | ICD-10-CM | POA: Diagnosis not present

## 2021-12-23 DIAGNOSIS — R262 Difficulty in walking, not elsewhere classified: Secondary | ICD-10-CM | POA: Diagnosis not present

## 2021-12-23 DIAGNOSIS — Z9181 History of falling: Secondary | ICD-10-CM | POA: Diagnosis not present

## 2021-12-24 DIAGNOSIS — R262 Difficulty in walking, not elsewhere classified: Secondary | ICD-10-CM | POA: Diagnosis not present

## 2021-12-24 DIAGNOSIS — M6281 Muscle weakness (generalized): Secondary | ICD-10-CM | POA: Diagnosis not present

## 2021-12-24 DIAGNOSIS — R1312 Dysphagia, oropharyngeal phase: Secondary | ICD-10-CM | POA: Diagnosis not present

## 2021-12-24 DIAGNOSIS — R471 Dysarthria and anarthria: Secondary | ICD-10-CM | POA: Diagnosis not present

## 2021-12-24 DIAGNOSIS — N179 Acute kidney failure, unspecified: Secondary | ICD-10-CM | POA: Diagnosis not present

## 2021-12-24 DIAGNOSIS — Z9181 History of falling: Secondary | ICD-10-CM | POA: Diagnosis not present

## 2021-12-25 DIAGNOSIS — R262 Difficulty in walking, not elsewhere classified: Secondary | ICD-10-CM | POA: Diagnosis not present

## 2021-12-25 DIAGNOSIS — N179 Acute kidney failure, unspecified: Secondary | ICD-10-CM | POA: Diagnosis not present

## 2021-12-25 DIAGNOSIS — R1312 Dysphagia, oropharyngeal phase: Secondary | ICD-10-CM | POA: Diagnosis not present

## 2021-12-25 DIAGNOSIS — R471 Dysarthria and anarthria: Secondary | ICD-10-CM | POA: Diagnosis not present

## 2021-12-25 DIAGNOSIS — Z9181 History of falling: Secondary | ICD-10-CM | POA: Diagnosis not present

## 2021-12-25 DIAGNOSIS — M6281 Muscle weakness (generalized): Secondary | ICD-10-CM | POA: Diagnosis not present

## 2021-12-26 ENCOUNTER — Other Ambulatory Visit: Payer: Self-pay | Admitting: Family Medicine

## 2021-12-26 DIAGNOSIS — Z9181 History of falling: Secondary | ICD-10-CM | POA: Diagnosis not present

## 2021-12-26 DIAGNOSIS — R1312 Dysphagia, oropharyngeal phase: Secondary | ICD-10-CM | POA: Diagnosis not present

## 2021-12-26 DIAGNOSIS — R262 Difficulty in walking, not elsewhere classified: Secondary | ICD-10-CM | POA: Diagnosis not present

## 2021-12-26 DIAGNOSIS — F32A Depression, unspecified: Secondary | ICD-10-CM

## 2021-12-26 DIAGNOSIS — M6281 Muscle weakness (generalized): Secondary | ICD-10-CM | POA: Diagnosis not present

## 2021-12-26 DIAGNOSIS — N179 Acute kidney failure, unspecified: Secondary | ICD-10-CM | POA: Diagnosis not present

## 2021-12-26 DIAGNOSIS — R471 Dysarthria and anarthria: Secondary | ICD-10-CM | POA: Diagnosis not present

## 2021-12-27 DIAGNOSIS — N139 Obstructive and reflux uropathy, unspecified: Secondary | ICD-10-CM | POA: Diagnosis not present

## 2021-12-27 DIAGNOSIS — E119 Type 2 diabetes mellitus without complications: Secondary | ICD-10-CM | POA: Diagnosis not present

## 2021-12-27 DIAGNOSIS — R69 Illness, unspecified: Secondary | ICD-10-CM | POA: Diagnosis not present

## 2021-12-27 DIAGNOSIS — N179 Acute kidney failure, unspecified: Secondary | ICD-10-CM | POA: Diagnosis not present

## 2021-12-27 DIAGNOSIS — E785 Hyperlipidemia, unspecified: Secondary | ICD-10-CM | POA: Diagnosis not present

## 2021-12-27 DIAGNOSIS — R471 Dysarthria and anarthria: Secondary | ICD-10-CM | POA: Diagnosis not present

## 2021-12-27 DIAGNOSIS — Z9181 History of falling: Secondary | ICD-10-CM | POA: Diagnosis not present

## 2021-12-27 DIAGNOSIS — R5381 Other malaise: Secondary | ICD-10-CM | POA: Diagnosis not present

## 2021-12-27 DIAGNOSIS — I10 Essential (primary) hypertension: Secondary | ICD-10-CM | POA: Diagnosis not present

## 2021-12-27 DIAGNOSIS — M6281 Muscle weakness (generalized): Secondary | ICD-10-CM | POA: Diagnosis not present

## 2021-12-27 DIAGNOSIS — R1312 Dysphagia, oropharyngeal phase: Secondary | ICD-10-CM | POA: Diagnosis not present

## 2021-12-27 DIAGNOSIS — R262 Difficulty in walking, not elsewhere classified: Secondary | ICD-10-CM | POA: Diagnosis not present

## 2021-12-30 DIAGNOSIS — R338 Other retention of urine: Secondary | ICD-10-CM | POA: Diagnosis not present

## 2021-12-30 DIAGNOSIS — R471 Dysarthria and anarthria: Secondary | ICD-10-CM | POA: Diagnosis not present

## 2021-12-30 DIAGNOSIS — M6281 Muscle weakness (generalized): Secondary | ICD-10-CM | POA: Diagnosis not present

## 2021-12-30 DIAGNOSIS — Z9181 History of falling: Secondary | ICD-10-CM | POA: Diagnosis not present

## 2021-12-30 DIAGNOSIS — R262 Difficulty in walking, not elsewhere classified: Secondary | ICD-10-CM | POA: Diagnosis not present

## 2021-12-30 DIAGNOSIS — R1312 Dysphagia, oropharyngeal phase: Secondary | ICD-10-CM | POA: Diagnosis not present

## 2021-12-30 DIAGNOSIS — N179 Acute kidney failure, unspecified: Secondary | ICD-10-CM | POA: Diagnosis not present

## 2021-12-31 ENCOUNTER — Telehealth (INDEPENDENT_AMBULATORY_CARE_PROVIDER_SITE_OTHER): Payer: Medicare HMO | Admitting: *Deleted

## 2021-12-31 DIAGNOSIS — R471 Dysarthria and anarthria: Secondary | ICD-10-CM | POA: Diagnosis not present

## 2021-12-31 DIAGNOSIS — M6281 Muscle weakness (generalized): Secondary | ICD-10-CM | POA: Diagnosis not present

## 2021-12-31 DIAGNOSIS — H25811 Combined forms of age-related cataract, right eye: Secondary | ICD-10-CM | POA: Diagnosis not present

## 2021-12-31 DIAGNOSIS — R1312 Dysphagia, oropharyngeal phase: Secondary | ICD-10-CM | POA: Diagnosis not present

## 2021-12-31 DIAGNOSIS — R262 Difficulty in walking, not elsewhere classified: Secondary | ICD-10-CM | POA: Diagnosis not present

## 2021-12-31 DIAGNOSIS — E113393 Type 2 diabetes mellitus with moderate nonproliferative diabetic retinopathy without macular edema, bilateral: Secondary | ICD-10-CM | POA: Diagnosis not present

## 2021-12-31 DIAGNOSIS — Z9181 History of falling: Secondary | ICD-10-CM | POA: Diagnosis not present

## 2021-12-31 DIAGNOSIS — N179 Acute kidney failure, unspecified: Secondary | ICD-10-CM | POA: Diagnosis not present

## 2021-12-31 NOTE — Telephone Encounter (Signed)
   Pre-operative Risk Assessment    Patient Name: [name]  DOB: 05-May-1951 MRN: [id number]     PT IS GOING TO NEED A NEW PT APPT AS PT LAST SEEN BY CARD 2013. WILL SEND A MESSAGE TO CHART PREP TEAM Request for Surgical Clearance    Procedure:   PROSTATE ULTRASOUND  Date of Surgery:  Clearance 01/31/22                                 Surgeon:  DR. MARYELLEN PACE Surgeon's Group or Practice Name:  [facility] Phone number:  [phone] Fax number:  [phone]   Type of Clearance Requested:   - Medical  - Pharmacy:  Hold Clopidogrel (Plavix)     Type of Anesthesia:  Not Indicated   Additional requests/questions:    [name]   12/31/2021, 6:05 PM

## 2022-01-01 DIAGNOSIS — M6281 Muscle weakness (generalized): Secondary | ICD-10-CM | POA: Diagnosis not present

## 2022-01-01 DIAGNOSIS — R471 Dysarthria and anarthria: Secondary | ICD-10-CM | POA: Diagnosis not present

## 2022-01-01 DIAGNOSIS — N179 Acute kidney failure, unspecified: Secondary | ICD-10-CM | POA: Diagnosis not present

## 2022-01-01 DIAGNOSIS — Z9181 History of falling: Secondary | ICD-10-CM | POA: Diagnosis not present

## 2022-01-01 DIAGNOSIS — R262 Difficulty in walking, not elsewhere classified: Secondary | ICD-10-CM | POA: Diagnosis not present

## 2022-01-01 DIAGNOSIS — R1312 Dysphagia, oropharyngeal phase: Secondary | ICD-10-CM | POA: Diagnosis not present

## 2022-01-02 DIAGNOSIS — M6281 Muscle weakness (generalized): Secondary | ICD-10-CM | POA: Diagnosis not present

## 2022-01-02 DIAGNOSIS — N179 Acute kidney failure, unspecified: Secondary | ICD-10-CM | POA: Diagnosis not present

## 2022-01-02 DIAGNOSIS — R471 Dysarthria and anarthria: Secondary | ICD-10-CM | POA: Diagnosis not present

## 2022-01-02 DIAGNOSIS — R262 Difficulty in walking, not elsewhere classified: Secondary | ICD-10-CM | POA: Diagnosis not present

## 2022-01-02 DIAGNOSIS — R1312 Dysphagia, oropharyngeal phase: Secondary | ICD-10-CM | POA: Diagnosis not present

## 2022-01-02 DIAGNOSIS — Z9181 History of falling: Secondary | ICD-10-CM | POA: Diagnosis not present

## 2022-01-03 DIAGNOSIS — R262 Difficulty in walking, not elsewhere classified: Secondary | ICD-10-CM | POA: Diagnosis not present

## 2022-01-03 DIAGNOSIS — R471 Dysarthria and anarthria: Secondary | ICD-10-CM | POA: Diagnosis not present

## 2022-01-03 DIAGNOSIS — Z9181 History of falling: Secondary | ICD-10-CM | POA: Diagnosis not present

## 2022-01-03 DIAGNOSIS — N179 Acute kidney failure, unspecified: Secondary | ICD-10-CM | POA: Diagnosis not present

## 2022-01-03 DIAGNOSIS — H25811 Combined forms of age-related cataract, right eye: Secondary | ICD-10-CM | POA: Diagnosis not present

## 2022-01-03 DIAGNOSIS — R1312 Dysphagia, oropharyngeal phase: Secondary | ICD-10-CM | POA: Diagnosis not present

## 2022-01-03 DIAGNOSIS — M6281 Muscle weakness (generalized): Secondary | ICD-10-CM | POA: Diagnosis not present

## 2022-01-03 NOTE — Telephone Encounter (Signed)
Left message to call back for new pt appt for pre op clearance

## 2022-01-06 DIAGNOSIS — F32A Depression, unspecified: Secondary | ICD-10-CM | POA: Diagnosis not present

## 2022-01-06 DIAGNOSIS — M6281 Muscle weakness (generalized): Secondary | ICD-10-CM | POA: Diagnosis not present

## 2022-01-06 DIAGNOSIS — I10 Essential (primary) hypertension: Secondary | ICD-10-CM | POA: Diagnosis not present

## 2022-01-06 DIAGNOSIS — R69 Illness, unspecified: Secondary | ICD-10-CM | POA: Diagnosis not present

## 2022-01-06 DIAGNOSIS — R471 Dysarthria and anarthria: Secondary | ICD-10-CM | POA: Diagnosis not present

## 2022-01-06 DIAGNOSIS — R1312 Dysphagia, oropharyngeal phase: Secondary | ICD-10-CM | POA: Diagnosis not present

## 2022-01-06 DIAGNOSIS — Z9181 History of falling: Secondary | ICD-10-CM | POA: Diagnosis not present

## 2022-01-06 DIAGNOSIS — N179 Acute kidney failure, unspecified: Secondary | ICD-10-CM | POA: Diagnosis not present

## 2022-01-06 DIAGNOSIS — R262 Difficulty in walking, not elsewhere classified: Secondary | ICD-10-CM | POA: Diagnosis not present

## 2022-01-06 DIAGNOSIS — E1165 Type 2 diabetes mellitus with hyperglycemia: Secondary | ICD-10-CM | POA: Diagnosis not present

## 2022-01-09 NOTE — Telephone Encounter (Signed)
Left message x 2 to call back for new pt appt for pre op clearance. Procedure is set at this time for 01/31/22.

## 2022-01-10 DIAGNOSIS — R69 Illness, unspecified: Secondary | ICD-10-CM | POA: Diagnosis not present

## 2022-01-10 DIAGNOSIS — E119 Type 2 diabetes mellitus without complications: Secondary | ICD-10-CM | POA: Diagnosis not present

## 2022-01-10 DIAGNOSIS — I10 Essential (primary) hypertension: Secondary | ICD-10-CM | POA: Diagnosis not present

## 2022-01-10 DIAGNOSIS — N139 Obstructive and reflux uropathy, unspecified: Secondary | ICD-10-CM | POA: Diagnosis not present

## 2022-01-10 DIAGNOSIS — E785 Hyperlipidemia, unspecified: Secondary | ICD-10-CM | POA: Diagnosis not present

## 2022-01-14 ENCOUNTER — Encounter: Payer: Self-pay | Admitting: *Deleted

## 2022-01-14 NOTE — Telephone Encounter (Signed)
Left message x 3 to call the office for a new pt appt for pre op clearance. Will send the pt a letter to call the office. Will update the requesting office the that we have not been able to reach the pt. I will remove from the pre op call back pol at this time.

## 2022-01-22 ENCOUNTER — Encounter: Payer: Self-pay | Admitting: Cardiovascular Disease

## 2022-01-22 ENCOUNTER — Ambulatory Visit: Payer: Medicare HMO | Admitting: Cardiovascular Disease

## 2022-01-22 VITALS — BP 138/76 | HR 60 | Ht 67.0 in | Wt 201.0 lb

## 2022-01-22 DIAGNOSIS — I739 Peripheral vascular disease, unspecified: Secondary | ICD-10-CM

## 2022-01-22 DIAGNOSIS — I63522 Cerebral infarction due to unspecified occlusion or stenosis of left anterior cerebral artery: Secondary | ICD-10-CM

## 2022-01-22 DIAGNOSIS — I10 Essential (primary) hypertension: Secondary | ICD-10-CM | POA: Diagnosis not present

## 2022-01-22 DIAGNOSIS — E1165 Type 2 diabetes mellitus with hyperglycemia: Secondary | ICD-10-CM | POA: Diagnosis not present

## 2022-01-22 NOTE — Progress Notes (Signed)
Cardiology Office Note:    Date:  01/22/2022   ID:  [name], DOB Jan 22, 1951, MRN [id number]  PCP:  [name], [name] Providers Cardiologist:  [name]    Referring MD: Kyle Fries, MD   Chief Complaint  Patient presents with   peripheral arterial disease   Hypertension         History of Present Illness:    [name] is a 71 y.o. male with a hx of history of stroke, hypertension, diabetes mellitus, DVT, peripheral arterial disease  He now lives in an assisted living facility-[facility]. He has been found to have elevated PSA.  He needs to have a prostate ultrasound. The doctors are requesting that we hold Plavix prior to the procedure. He is on plavix for treatment of a stroke in 2018.    No cardiac issues Breathing is ok No CP   Has a mild LVOT gradient.  Max gradient was 10 mmHg by recent echocardiogram.  He has hyperdynamic LV systolic function with an EF of 70 to 75%. Trivial AI     Past Medical History:  Diagnosis Date   CVA (cerebral infarction) 01/03/2008   MRI: Acute 1 x 1.5 cm infarction affecting the left side of the pons.   Diabetes mellitus    DVT (deep venous thrombosis) (HCC)    Hypertension    PAD (peripheral artery disease) ([place])    Stroke ([place])    2008    Past Surgical History:  Procedure Laterality Date   COLONOSCOPY     None      Current Medications: Current Meds  Medication Sig   amLODipine (NORVASC) 5 MG tablet TAKE 1 TABLET BY MOUTH EVERYDAY AT BEDTIME (Patient taking differently: Take 5 mg by mouth daily.)   atorvastatin (LIPITOR) 40 MG tablet Take 1 tablet (40 mg total) by mouth daily.   Capsicum, Cayenne, (CAYENNE PEPPER PO) Take 1 tablet by mouth at bedtime.   carvedilol (COREG) 25 MG tablet TAKE 1 TABLET BY MOUTH TWICE A DAY WITH MEALS   clopidogrel (PLAVIX) 75 MG tablet Take 1 tablet (75 mg total) by mouth daily.   empagliflozin (JARDIANCE) 10 MG TABS  tablet Take 1 tablet (10 mg total) by mouth daily.   Insulin Pen Needle (B-D ULTRAFINE III SHORT PEN) 31G X 8 MM MISC USE WITH LEVEMIR   metFORMIN (GLUCOPHAGE) 1000 MG tablet TAKE 1 TABLET (1,000 MG TOTAL) BY MOUTH 2 (TWO) TIMES DAILY WITH A MEAL.   polyethylene glycol (MIRALAX / GLYCOLAX) 17 g packet Take 17 g by mouth daily as needed.   prednisoLONE acetate (PRED FORTE) 1 % ophthalmic suspension Place 1 drop into the right eye 4 (four) times daily.   senna (SENOKOT) 8.6 MG TABS tablet Take 1 tablet (8.6 mg total) by mouth daily as needed for mild constipation.   sertraline (ZOLOFT) 100 MG tablet TAKE 1 TABLET BY MOUTH EVERY DAY   tamsulosin (FLOMAX) 0.4 MG CAPS capsule Take 2 capsules (0.8 mg total) by mouth daily.     Allergies:   Pork allergy and Shrimp flavor   Social History   Socioeconomic History   Marital status: Divorced    Spouse name: Not on file   Number of children: 3   Years of education: 14   Highest education level: Not on file  Occupational History   Occupation: Disabled-security guard    Employer: [employer]  Tobacco Use   Smoking status:  Never   Smokeless tobacco: Never  Substance and Sexual Activity   Alcohol use: No   Drug use: No   Sexual activity: Never    Partners: Female  Other Topics Concern   Not on file  Social History Narrative   Divorced, lives alone   2 sons, 1 daughter   Retired/disabled   No caffeine   12/24/2015   Social Determinants of Health   Financial Resource Strain: Not on file  Food Insecurity: Not on file  Transportation Needs: Not on file  Physical Activity: Not on file  Stress: Not on file  Social Connections: Not on file     Family History: The patient's family history includes Diabetes in his mother and sister; Heart attack in his mother; Hypertension in his brother, daughter, mother, sister, and son; Stroke in his sister. There is no history of Colon cancer, Esophageal cancer, Stomach cancer, or Rectal  cancer.  ROS:   Please see the history of present illness.     All other systems reviewed and are negative.  EKGs/Labs/Other Studies Reviewed:    The following studies were reviewed today:   EKG:    Recent Labs: 11/15/2021: ALT 14; B Natriuretic Peptide 157.1; TSH 0.882 12/10/2021: BUN 19; Creatinine, Ser 1.13; Hemoglobin 11.3; Platelets 148; Potassium 4.8; Sodium 138  Recent Lipid Panel    Component Value Date/Time   CHOL 108 11/16/2021 0050   CHOL 111 11/27/2020 1420   TRIG 62 11/16/2021 0050   HDL 37 (L) 11/16/2021 0050   HDL 47 11/27/2020 1420   CHOLHDL 2.9 11/16/2021 0050   VLDL 12 11/16/2021 0050   LDLCALC 59 11/16/2021 0050   LDLCALC 52 11/27/2020 1420   LDLDIRECT 78 09/10/2011 1705     Risk Assessment/Calculations:                Physical Exam:    VS:  BP 138/76   Pulse 60   Ht '5\' 7"'$  (1.702 m)   Wt 201 lb (91.2 kg)   SpO2 99%   BMI 31.48 kg/m     Wt Readings from Last 3 Encounters:  01/22/22 201 lb (91.2 kg)  12/10/21 207 lb 3.7 oz (94 kg)  11/20/21 208 lb 15.9 oz (94.8 kg)     GEN: Elderly gentleman, no acute distress.  Examined in the wheelchair in no acute distress HEENT: Normal NECK: No JVD; No carotid bruits LYMPHATICS: No lymphadenopathy CARDIAC: Regular rate V4-M0.  1 - 2/6 systolic ejection murmur RESPIRATORY:  Clear to auscultation without rales, wheezing or rhonchi  ABDOMEN: Soft, non-tender, non-distended MUSCULOSKELETAL: 2+ pitting edema bilaterally.  No deformity  SKIN: Warm and dry NEUROLOGIC:  Alert and oriented x 3, right-sided paralysis PSYCHIATRIC:  Normal affect   ASSESSMENT:    No diagnosis found. PLAN:    In order of problems listed above:  Preoperative evaluation: Mr. [name] does not have any cardiac complaints.  He has not had any coronary stenting.  He is currently on Plavix for stroke that he had in 2018.  We have been asked to comment on whether or not he can hold his Plavix.  Since the Plavix therapy was  started after he had a stroke, he will need to see neurology/stroke team to determine whether or not he is able to come off the Plavix.  He does not have any severe chest pain or shortness of breath.   He has supranormal left ventricular systolic function.  He has a very small LVOT dynamic gradient but this is  insignificant.  He is at low risk for his prostate biopsy from a cardiovascular standpoint.          Medication Adjustments/Labs and Tests Ordered: Current medicines are reviewed at length with the patient today.  Concerns regarding medicines are outlined above.  No orders of the defined types were placed in this encounter.  No orders of the defined types were placed in this encounter.   Patient Instructions   Follow-Up: At [facility], you and your health needs are our priority.  As part of our continuing mission to provide you with exceptional heart care, we have created designated Provider Care Teams.  These Care Teams include your primary Cardiologist (physician) and Advanced Practice Providers (APPs -  Physician Assistants and Nurse Practitioners) who all work together to provide you with the care you need, when you need it.  We recommend signing up for the patient portal called "MyChart".  Sign up information is provided on this After Visit Summary.  MyChart is used to connect with patients for Virtual Visits (Telemedicine).  Patients are able to view lab/test results, encounter notes, upcoming appointments, etc.  Non-urgent messages can be sent to your provider as well.   To learn more about what you can do with MyChart, go to [email or web address].    Your next appointment:   Patients to be cleared by neurology.}           Signed, Mertie Moores, MD  01/22/2022 2:20 PM    [place]

## 2022-01-22 NOTE — Telephone Encounter (Signed)
     Patient saw [place] in consultation to assess whether or not he can discontinue Plavix.  He does not have a history of coronary stenting or peripheral artery stenting.  He is on Plavix for a stroke that occurred in 2018.  He will need to see neurology to determine whether or not it safe for him to discontinue the Plavix.     Mertie Moores, MD  01/22/2022 2:04 PM    [address],  [address] [place], [place]  [address] Phone: [phone]; Fax: [phone]

## 2022-01-22 NOTE — Addendum Note (Signed)
Addended by: Thayer Headings on: 01/22/2022 02:19 PM   Modules accepted: Level of Service

## 2022-01-22 NOTE — Telephone Encounter (Signed)
     Primary Cardiologist: Lauree Chandler, MD  Chart reviewed as part of pre-operative protocol coverage. Given past medical history and time since last visit, based on ACC/AHA guidelines, Neng Albee Defina would be at acceptable risk for the planned procedure without further cardiovascular testing.   Patient's clopidogrel was prescribed by neurology.  Recommendations for holding clopidogrel will need to come from prescribing provider.  I will route this recommendation to the requesting party via Epic fax function and remove from pre-op pool.  Please call with questions.  Jossie Ng. Samariya Rockhold NP-C     01/22/2022, 2:33 PM [address] Office [phone] Fax [phone]

## 2022-01-22 NOTE — Patient Instructions (Addendum)
  Follow-Up: At [facility], you and your health needs are our priority.  As part of our continuing mission to provide you with exceptional heart care, we have created designated Provider Care Teams.  These Care Teams include your primary Cardiologist (physician) and Advanced Practice Providers (APPs -  Physician Assistants and Nurse Practitioners) who all work together to provide you with the care you need, when you need it.  We recommend signing up for the patient portal called "MyChart".  Sign up information is provided on this After Visit Summary.  MyChart is used to connect with patients for Virtual Visits (Telemedicine).  Patients are able to view lab/test results, encounter notes, upcoming appointments, etc.  Non-urgent messages can be sent to your provider as well.   To learn more about what you can do with MyChart, go to [email or web address].    Your next appointment:   Patients to be cleared by neurology.}

## 2022-01-23 DIAGNOSIS — R319 Hematuria, unspecified: Secondary | ICD-10-CM | POA: Diagnosis not present

## 2022-01-23 DIAGNOSIS — I10 Essential (primary) hypertension: Secondary | ICD-10-CM | POA: Diagnosis not present

## 2022-01-23 DIAGNOSIS — R69 Illness, unspecified: Secondary | ICD-10-CM | POA: Diagnosis not present

## 2022-01-23 DIAGNOSIS — N139 Obstructive and reflux uropathy, unspecified: Secondary | ICD-10-CM | POA: Diagnosis not present

## 2022-01-23 DIAGNOSIS — E119 Type 2 diabetes mellitus without complications: Secondary | ICD-10-CM | POA: Diagnosis not present

## 2022-01-23 DIAGNOSIS — E785 Hyperlipidemia, unspecified: Secondary | ICD-10-CM | POA: Diagnosis not present

## 2022-01-27 DIAGNOSIS — R69 Illness, unspecified: Secondary | ICD-10-CM | POA: Diagnosis not present

## 2022-01-27 DIAGNOSIS — F430 Acute stress reaction: Secondary | ICD-10-CM | POA: Diagnosis not present

## 2022-01-30 ENCOUNTER — Telehealth: Payer: Self-pay

## 2022-01-30 DIAGNOSIS — R338 Other retention of urine: Secondary | ICD-10-CM | POA: Diagnosis not present

## 2022-01-30 NOTE — Telephone Encounter (Signed)
Received phone call from [facility] to follow up on paperwork for clearance for upcoming procedure.   They need permission for patient to hold plavix five days prior to prostate biopsy. He was originally supposed to be scheduled for tomorrow, however, has been rescheduled for 9/7.  Form is in provider box for review and completion.   Talbot Grumbling, RN

## 2022-01-31 NOTE — Telephone Encounter (Signed)
Called and LVM with Gina at [facility] with provider's message.   Talbot Grumbling, RN

## 2022-02-06 DIAGNOSIS — C61 Malignant neoplasm of prostate: Secondary | ICD-10-CM | POA: Diagnosis not present

## 2022-02-06 DIAGNOSIS — E1165 Type 2 diabetes mellitus with hyperglycemia: Secondary | ICD-10-CM | POA: Diagnosis not present

## 2022-02-06 DIAGNOSIS — I10 Essential (primary) hypertension: Secondary | ICD-10-CM | POA: Diagnosis not present

## 2022-02-06 DIAGNOSIS — R972 Elevated prostate specific antigen [PSA]: Secondary | ICD-10-CM | POA: Diagnosis not present

## 2022-02-07 DIAGNOSIS — R69 Illness, unspecified: Secondary | ICD-10-CM | POA: Diagnosis not present

## 2022-02-07 DIAGNOSIS — F430 Acute stress reaction: Secondary | ICD-10-CM | POA: Diagnosis not present

## 2022-02-10 ENCOUNTER — Ambulatory Visit: Payer: Medicare HMO | Admitting: Podiatry

## 2022-02-12 DIAGNOSIS — H2512 Age-related nuclear cataract, left eye: Secondary | ICD-10-CM | POA: Diagnosis not present

## 2022-02-12 DIAGNOSIS — R69 Illness, unspecified: Secondary | ICD-10-CM | POA: Diagnosis not present

## 2022-02-12 DIAGNOSIS — I10 Essential (primary) hypertension: Secondary | ICD-10-CM | POA: Diagnosis not present

## 2022-02-12 DIAGNOSIS — E785 Hyperlipidemia, unspecified: Secondary | ICD-10-CM | POA: Diagnosis not present

## 2022-02-12 DIAGNOSIS — E119 Type 2 diabetes mellitus without complications: Secondary | ICD-10-CM | POA: Diagnosis not present

## 2022-02-12 DIAGNOSIS — N139 Obstructive and reflux uropathy, unspecified: Secondary | ICD-10-CM | POA: Diagnosis not present

## 2022-02-13 DIAGNOSIS — R69 Illness, unspecified: Secondary | ICD-10-CM | POA: Diagnosis not present

## 2022-02-13 DIAGNOSIS — F430 Acute stress reaction: Secondary | ICD-10-CM | POA: Diagnosis not present

## 2022-02-14 DIAGNOSIS — R338 Other retention of urine: Secondary | ICD-10-CM | POA: Diagnosis not present

## 2022-02-14 DIAGNOSIS — H25812 Combined forms of age-related cataract, left eye: Secondary | ICD-10-CM | POA: Diagnosis not present

## 2022-02-14 DIAGNOSIS — R3914 Feeling of incomplete bladder emptying: Secondary | ICD-10-CM | POA: Diagnosis not present

## 2022-02-14 DIAGNOSIS — C61 Malignant neoplasm of prostate: Secondary | ICD-10-CM | POA: Diagnosis not present

## 2022-02-14 DIAGNOSIS — N401 Enlarged prostate with lower urinary tract symptoms: Secondary | ICD-10-CM | POA: Diagnosis not present

## 2022-02-17 ENCOUNTER — Other Ambulatory Visit (HOSPITAL_COMMUNITY): Payer: Self-pay | Admitting: Urology

## 2022-02-17 DIAGNOSIS — C61 Malignant neoplasm of prostate: Secondary | ICD-10-CM

## 2022-02-20 DIAGNOSIS — N139 Obstructive and reflux uropathy, unspecified: Secondary | ICD-10-CM | POA: Diagnosis not present

## 2022-02-20 DIAGNOSIS — R69 Illness, unspecified: Secondary | ICD-10-CM | POA: Diagnosis not present

## 2022-02-20 DIAGNOSIS — E785 Hyperlipidemia, unspecified: Secondary | ICD-10-CM | POA: Diagnosis not present

## 2022-02-20 DIAGNOSIS — I10 Essential (primary) hypertension: Secondary | ICD-10-CM | POA: Diagnosis not present

## 2022-02-20 DIAGNOSIS — E119 Type 2 diabetes mellitus without complications: Secondary | ICD-10-CM | POA: Diagnosis not present

## 2022-02-24 DIAGNOSIS — R338 Other retention of urine: Secondary | ICD-10-CM | POA: Diagnosis not present

## 2022-02-26 ENCOUNTER — Encounter (HOSPITAL_COMMUNITY)
Admission: RE | Admit: 2022-02-26 | Discharge: 2022-02-26 | Disposition: A | Discharge status: Home / Self Care | Payer: Medicare HMO | Source: Ambulatory Visit | Attending: Urology | Admitting: Urology

## 2022-02-26 DIAGNOSIS — R69 Illness, unspecified: Secondary | ICD-10-CM | POA: Diagnosis not present

## 2022-02-26 DIAGNOSIS — C61 Malignant neoplasm of prostate: Secondary | ICD-10-CM | POA: Insufficient documentation

## 2022-02-26 DIAGNOSIS — F430 Acute stress reaction: Secondary | ICD-10-CM | POA: Diagnosis not present

## 2022-02-26 MED ORDER — PIFLIFOLASTAT F 18 (PYLARIFY) INJECTION
9.0000 | Freq: Once | INTRAVENOUS | Status: AC
  Administered 2022-02-26: 7.12 via INTRAVENOUS

## 2022-02-28 DIAGNOSIS — N179 Acute kidney failure, unspecified: Secondary | ICD-10-CM | POA: Diagnosis not present

## 2022-02-28 DIAGNOSIS — M6281 Muscle weakness (generalized): Secondary | ICD-10-CM | POA: Diagnosis not present

## 2022-02-28 DIAGNOSIS — R338 Other retention of urine: Secondary | ICD-10-CM | POA: Diagnosis not present

## 2022-02-28 DIAGNOSIS — N139 Obstructive and reflux uropathy, unspecified: Secondary | ICD-10-CM | POA: Diagnosis not present

## 2022-02-28 DIAGNOSIS — C7951 Secondary malignant neoplasm of bone: Secondary | ICD-10-CM | POA: Diagnosis not present

## 2022-02-28 DIAGNOSIS — C61 Malignant neoplasm of prostate: Secondary | ICD-10-CM | POA: Diagnosis not present

## 2022-02-28 DIAGNOSIS — R262 Difficulty in walking, not elsewhere classified: Secondary | ICD-10-CM | POA: Diagnosis not present

## 2022-03-02 DIAGNOSIS — R262 Difficulty in walking, not elsewhere classified: Secondary | ICD-10-CM | POA: Diagnosis not present

## 2022-03-02 DIAGNOSIS — N139 Obstructive and reflux uropathy, unspecified: Secondary | ICD-10-CM | POA: Diagnosis not present

## 2022-03-02 DIAGNOSIS — M6281 Muscle weakness (generalized): Secondary | ICD-10-CM | POA: Diagnosis not present

## 2022-03-02 DIAGNOSIS — N179 Acute kidney failure, unspecified: Secondary | ICD-10-CM | POA: Diagnosis not present

## 2022-03-03 DIAGNOSIS — M6281 Muscle weakness (generalized): Secondary | ICD-10-CM | POA: Diagnosis not present

## 2022-03-03 DIAGNOSIS — N179 Acute kidney failure, unspecified: Secondary | ICD-10-CM | POA: Diagnosis not present

## 2022-03-03 DIAGNOSIS — R262 Difficulty in walking, not elsewhere classified: Secondary | ICD-10-CM | POA: Diagnosis not present

## 2022-03-03 DIAGNOSIS — N139 Obstructive and reflux uropathy, unspecified: Secondary | ICD-10-CM | POA: Diagnosis not present

## 2022-03-04 DIAGNOSIS — I10 Essential (primary) hypertension: Secondary | ICD-10-CM | POA: Diagnosis not present

## 2022-03-04 DIAGNOSIS — E1165 Type 2 diabetes mellitus with hyperglycemia: Secondary | ICD-10-CM | POA: Diagnosis not present

## 2022-03-05 ENCOUNTER — Encounter: Payer: Self-pay | Admitting: Podiatry

## 2022-03-05 ENCOUNTER — Ambulatory Visit: Payer: Medicare HMO | Admitting: Podiatry

## 2022-03-05 DIAGNOSIS — R69 Illness, unspecified: Secondary | ICD-10-CM | POA: Diagnosis not present

## 2022-03-05 DIAGNOSIS — N139 Obstructive and reflux uropathy, unspecified: Secondary | ICD-10-CM | POA: Diagnosis not present

## 2022-03-05 DIAGNOSIS — N182 Chronic kidney disease, stage 2 (mild): Secondary | ICD-10-CM | POA: Diagnosis not present

## 2022-03-05 DIAGNOSIS — B351 Tinea unguium: Secondary | ICD-10-CM | POA: Diagnosis not present

## 2022-03-05 DIAGNOSIS — M79674 Pain in right toe(s): Secondary | ICD-10-CM | POA: Diagnosis not present

## 2022-03-05 DIAGNOSIS — M79675 Pain in left toe(s): Secondary | ICD-10-CM

## 2022-03-05 DIAGNOSIS — R262 Difficulty in walking, not elsewhere classified: Secondary | ICD-10-CM | POA: Diagnosis not present

## 2022-03-05 DIAGNOSIS — F321 Major depressive disorder, single episode, moderate: Secondary | ICD-10-CM | POA: Diagnosis not present

## 2022-03-05 DIAGNOSIS — L03039 Cellulitis of unspecified toe: Secondary | ICD-10-CM | POA: Diagnosis not present

## 2022-03-05 DIAGNOSIS — N179 Acute kidney failure, unspecified: Secondary | ICD-10-CM | POA: Diagnosis not present

## 2022-03-05 DIAGNOSIS — I739 Peripheral vascular disease, unspecified: Secondary | ICD-10-CM | POA: Diagnosis not present

## 2022-03-05 DIAGNOSIS — M6281 Muscle weakness (generalized): Secondary | ICD-10-CM | POA: Diagnosis not present

## 2022-03-05 MED ORDER — DOXYCYCLINE HYCLATE 100 MG PO TABS: 100.0000 mg | ORAL_TABLET | Freq: Two times a day (BID) | ORAL | 0 refills | Status: DC

## 2022-03-05 NOTE — Progress Notes (Signed)
This patient returns to my office for at risk foot care.  This patient requires this care by a professional since this patient will be at risk due to having CKD, PAD, CVA and diabetes.    This patient is unable to cut nails himself since the patient cannot reach his nails.These nails are painful walking and wearing shoes. He presents to the office in a wheelchair and is accompanied by male caregiver.  They relate bleeding in both big toes.   This patient presents for at risk foot care today.  General Appearance  Alert, conversant and in no acute stress.  Vascular  Dorsalis pedis and posterior tibial  pulses are  weakly palpable  bilaterally.  Capillary return is within normal limits  bilaterally. Temperature is within normal limits  bilaterally.  Neurologic  Senn-Weinstein monofilament wire test within normal limits  bilaterally. Muscle power within normal limits bilaterally.  Nails Thick disfigured discolored nails with subungual debris  from hallux to fifth toes bilaterally.  Orthopedic  No limitations of motion  feet .  No crepitus or effusions noted.  No bony pathology or digital deformities noted. Hallux limitus 1st MPJ  B/L.  Skin  normotropic skin with no porokeratosis noted bilaterally.  No signs of infections or ulcers noted.     Onychomycosis  Pain in right toes  Pain in left toes  Paronychia right hallux.    Consent was obtained for treatment procedures.   Mechanical debridement of nails 1-5  bilaterally performed with a nail nipper.  Filed with dremel without incident. The nail plate was unattached from nail bed and was removed.  No evidence of infection or drainage noted.  The nail plate right hallux was removed with evidence of infection and drainage.    Right hallux was bandaged with neosporin/DSD.   Talked with him about home treatment and we decided to peroxide his right hallux nail bed until the area is no longer draining.     Return office visit   3 months                   Told patient to return for periodic foot care and evaluation due to potential at risk complications.   Gardiner Barefoot DPM

## 2022-03-06 DIAGNOSIS — N179 Acute kidney failure, unspecified: Secondary | ICD-10-CM | POA: Diagnosis not present

## 2022-03-06 DIAGNOSIS — M6281 Muscle weakness (generalized): Secondary | ICD-10-CM | POA: Diagnosis not present

## 2022-03-06 DIAGNOSIS — N139 Obstructive and reflux uropathy, unspecified: Secondary | ICD-10-CM | POA: Diagnosis not present

## 2022-03-06 DIAGNOSIS — R262 Difficulty in walking, not elsewhere classified: Secondary | ICD-10-CM | POA: Diagnosis not present

## 2022-03-07 DIAGNOSIS — N179 Acute kidney failure, unspecified: Secondary | ICD-10-CM | POA: Diagnosis not present

## 2022-03-07 DIAGNOSIS — R262 Difficulty in walking, not elsewhere classified: Secondary | ICD-10-CM | POA: Diagnosis not present

## 2022-03-07 DIAGNOSIS — M6281 Muscle weakness (generalized): Secondary | ICD-10-CM | POA: Diagnosis not present

## 2022-03-07 DIAGNOSIS — N139 Obstructive and reflux uropathy, unspecified: Secondary | ICD-10-CM | POA: Diagnosis not present

## 2022-03-10 DIAGNOSIS — N179 Acute kidney failure, unspecified: Secondary | ICD-10-CM | POA: Diagnosis not present

## 2022-03-10 DIAGNOSIS — R262 Difficulty in walking, not elsewhere classified: Secondary | ICD-10-CM | POA: Diagnosis not present

## 2022-03-10 DIAGNOSIS — N139 Obstructive and reflux uropathy, unspecified: Secondary | ICD-10-CM | POA: Diagnosis not present

## 2022-03-10 DIAGNOSIS — M6281 Muscle weakness (generalized): Secondary | ICD-10-CM | POA: Diagnosis not present

## 2022-03-11 DIAGNOSIS — N139 Obstructive and reflux uropathy, unspecified: Secondary | ICD-10-CM | POA: Diagnosis not present

## 2022-03-11 DIAGNOSIS — R262 Difficulty in walking, not elsewhere classified: Secondary | ICD-10-CM | POA: Diagnosis not present

## 2022-03-11 DIAGNOSIS — N179 Acute kidney failure, unspecified: Secondary | ICD-10-CM | POA: Diagnosis not present

## 2022-03-11 DIAGNOSIS — M6281 Muscle weakness (generalized): Secondary | ICD-10-CM | POA: Diagnosis not present

## 2022-03-12 DIAGNOSIS — M6281 Muscle weakness (generalized): Secondary | ICD-10-CM | POA: Diagnosis not present

## 2022-03-12 DIAGNOSIS — R262 Difficulty in walking, not elsewhere classified: Secondary | ICD-10-CM | POA: Diagnosis not present

## 2022-03-12 DIAGNOSIS — N179 Acute kidney failure, unspecified: Secondary | ICD-10-CM | POA: Diagnosis not present

## 2022-03-12 DIAGNOSIS — N139 Obstructive and reflux uropathy, unspecified: Secondary | ICD-10-CM | POA: Diagnosis not present

## 2022-03-13 DIAGNOSIS — R972 Elevated prostate specific antigen [PSA]: Secondary | ICD-10-CM | POA: Diagnosis not present

## 2022-03-13 DIAGNOSIS — M6281 Muscle weakness (generalized): Secondary | ICD-10-CM | POA: Diagnosis not present

## 2022-03-13 DIAGNOSIS — N139 Obstructive and reflux uropathy, unspecified: Secondary | ICD-10-CM | POA: Diagnosis not present

## 2022-03-13 DIAGNOSIS — R262 Difficulty in walking, not elsewhere classified: Secondary | ICD-10-CM | POA: Diagnosis not present

## 2022-03-13 DIAGNOSIS — N179 Acute kidney failure, unspecified: Secondary | ICD-10-CM | POA: Diagnosis not present

## 2022-03-17 DIAGNOSIS — R339 Retention of urine, unspecified: Secondary | ICD-10-CM | POA: Diagnosis not present

## 2022-03-17 DIAGNOSIS — M6281 Muscle weakness (generalized): Secondary | ICD-10-CM | POA: Diagnosis not present

## 2022-03-17 DIAGNOSIS — N179 Acute kidney failure, unspecified: Secondary | ICD-10-CM | POA: Diagnosis not present

## 2022-03-17 DIAGNOSIS — N139 Obstructive and reflux uropathy, unspecified: Secondary | ICD-10-CM | POA: Diagnosis not present

## 2022-03-17 DIAGNOSIS — R262 Difficulty in walking, not elsewhere classified: Secondary | ICD-10-CM | POA: Diagnosis not present

## 2022-03-18 DIAGNOSIS — N179 Acute kidney failure, unspecified: Secondary | ICD-10-CM | POA: Diagnosis not present

## 2022-03-18 DIAGNOSIS — R262 Difficulty in walking, not elsewhere classified: Secondary | ICD-10-CM | POA: Diagnosis not present

## 2022-03-18 DIAGNOSIS — M6281 Muscle weakness (generalized): Secondary | ICD-10-CM | POA: Diagnosis not present

## 2022-03-18 DIAGNOSIS — N139 Obstructive and reflux uropathy, unspecified: Secondary | ICD-10-CM | POA: Diagnosis not present

## 2022-03-19 DIAGNOSIS — I10 Essential (primary) hypertension: Secondary | ICD-10-CM | POA: Diagnosis not present

## 2022-03-19 DIAGNOSIS — R5381 Other malaise: Secondary | ICD-10-CM | POA: Diagnosis not present

## 2022-03-19 DIAGNOSIS — C61 Malignant neoplasm of prostate: Secondary | ICD-10-CM | POA: Diagnosis not present

## 2022-03-19 DIAGNOSIS — E119 Type 2 diabetes mellitus without complications: Secondary | ICD-10-CM | POA: Diagnosis not present

## 2022-03-20 DIAGNOSIS — E119 Type 2 diabetes mellitus without complications: Secondary | ICD-10-CM | POA: Diagnosis not present

## 2022-03-20 DIAGNOSIS — C61 Malignant neoplasm of prostate: Secondary | ICD-10-CM | POA: Diagnosis not present

## 2022-03-20 DIAGNOSIS — E785 Hyperlipidemia, unspecified: Secondary | ICD-10-CM | POA: Diagnosis not present

## 2022-03-20 DIAGNOSIS — N139 Obstructive and reflux uropathy, unspecified: Secondary | ICD-10-CM | POA: Diagnosis not present

## 2022-03-20 DIAGNOSIS — R69 Illness, unspecified: Secondary | ICD-10-CM | POA: Diagnosis not present

## 2022-03-21 DIAGNOSIS — C61 Malignant neoplasm of prostate: Secondary | ICD-10-CM | POA: Diagnosis not present

## 2022-03-24 DIAGNOSIS — M6281 Muscle weakness (generalized): Secondary | ICD-10-CM | POA: Diagnosis not present

## 2022-03-24 DIAGNOSIS — R262 Difficulty in walking, not elsewhere classified: Secondary | ICD-10-CM | POA: Diagnosis not present

## 2022-03-24 DIAGNOSIS — N139 Obstructive and reflux uropathy, unspecified: Secondary | ICD-10-CM | POA: Diagnosis not present

## 2022-03-24 DIAGNOSIS — N179 Acute kidney failure, unspecified: Secondary | ICD-10-CM | POA: Diagnosis not present

## 2022-03-25 DIAGNOSIS — Z23 Encounter for immunization: Secondary | ICD-10-CM | POA: Diagnosis not present

## 2022-03-25 DIAGNOSIS — N179 Acute kidney failure, unspecified: Secondary | ICD-10-CM | POA: Diagnosis not present

## 2022-03-25 DIAGNOSIS — M6281 Muscle weakness (generalized): Secondary | ICD-10-CM | POA: Diagnosis not present

## 2022-03-25 DIAGNOSIS — N139 Obstructive and reflux uropathy, unspecified: Secondary | ICD-10-CM | POA: Diagnosis not present

## 2022-03-25 DIAGNOSIS — R262 Difficulty in walking, not elsewhere classified: Secondary | ICD-10-CM | POA: Diagnosis not present

## 2022-03-26 DIAGNOSIS — C772 Secondary and unspecified malignant neoplasm of intra-abdominal lymph nodes: Secondary | ICD-10-CM | POA: Diagnosis not present

## 2022-03-26 DIAGNOSIS — C7951 Secondary malignant neoplasm of bone: Secondary | ICD-10-CM | POA: Diagnosis not present

## 2022-03-26 DIAGNOSIS — C61 Malignant neoplasm of prostate: Secondary | ICD-10-CM | POA: Diagnosis not present

## 2022-03-26 DIAGNOSIS — M6281 Muscle weakness (generalized): Secondary | ICD-10-CM | POA: Diagnosis not present

## 2022-03-26 DIAGNOSIS — N179 Acute kidney failure, unspecified: Secondary | ICD-10-CM | POA: Diagnosis not present

## 2022-03-26 DIAGNOSIS — N139 Obstructive and reflux uropathy, unspecified: Secondary | ICD-10-CM | POA: Diagnosis not present

## 2022-03-26 DIAGNOSIS — R262 Difficulty in walking, not elsewhere classified: Secondary | ICD-10-CM | POA: Diagnosis not present

## 2022-03-27 DIAGNOSIS — N139 Obstructive and reflux uropathy, unspecified: Secondary | ICD-10-CM | POA: Diagnosis not present

## 2022-03-27 DIAGNOSIS — R262 Difficulty in walking, not elsewhere classified: Secondary | ICD-10-CM | POA: Diagnosis not present

## 2022-03-27 DIAGNOSIS — N179 Acute kidney failure, unspecified: Secondary | ICD-10-CM | POA: Diagnosis not present

## 2022-03-27 DIAGNOSIS — M6281 Muscle weakness (generalized): Secondary | ICD-10-CM | POA: Diagnosis not present

## 2022-03-30 DIAGNOSIS — M6281 Muscle weakness (generalized): Secondary | ICD-10-CM | POA: Diagnosis not present

## 2022-03-30 DIAGNOSIS — R262 Difficulty in walking, not elsewhere classified: Secondary | ICD-10-CM | POA: Diagnosis not present

## 2022-03-30 DIAGNOSIS — N139 Obstructive and reflux uropathy, unspecified: Secondary | ICD-10-CM | POA: Diagnosis not present

## 2022-03-30 DIAGNOSIS — N179 Acute kidney failure, unspecified: Secondary | ICD-10-CM | POA: Diagnosis not present

## 2022-03-31 DIAGNOSIS — M6281 Muscle weakness (generalized): Secondary | ICD-10-CM | POA: Diagnosis not present

## 2022-03-31 DIAGNOSIS — N179 Acute kidney failure, unspecified: Secondary | ICD-10-CM | POA: Diagnosis not present

## 2022-03-31 DIAGNOSIS — R262 Difficulty in walking, not elsewhere classified: Secondary | ICD-10-CM | POA: Diagnosis not present

## 2022-03-31 DIAGNOSIS — N139 Obstructive and reflux uropathy, unspecified: Secondary | ICD-10-CM | POA: Diagnosis not present

## 2022-04-01 DIAGNOSIS — N179 Acute kidney failure, unspecified: Secondary | ICD-10-CM | POA: Diagnosis not present

## 2022-04-01 DIAGNOSIS — R262 Difficulty in walking, not elsewhere classified: Secondary | ICD-10-CM | POA: Diagnosis not present

## 2022-04-01 DIAGNOSIS — M6281 Muscle weakness (generalized): Secondary | ICD-10-CM | POA: Diagnosis not present

## 2022-04-01 DIAGNOSIS — N139 Obstructive and reflux uropathy, unspecified: Secondary | ICD-10-CM | POA: Diagnosis not present

## 2022-04-02 DIAGNOSIS — E1165 Type 2 diabetes mellitus with hyperglycemia: Secondary | ICD-10-CM | POA: Diagnosis not present

## 2022-04-02 DIAGNOSIS — C61 Malignant neoplasm of prostate: Secondary | ICD-10-CM | POA: Diagnosis not present

## 2022-04-02 DIAGNOSIS — I10 Essential (primary) hypertension: Secondary | ICD-10-CM | POA: Diagnosis not present

## 2022-04-02 DIAGNOSIS — M6281 Muscle weakness (generalized): Secondary | ICD-10-CM | POA: Diagnosis not present

## 2022-04-02 DIAGNOSIS — R338 Other retention of urine: Secondary | ICD-10-CM | POA: Diagnosis not present

## 2022-04-02 DIAGNOSIS — N179 Acute kidney failure, unspecified: Secondary | ICD-10-CM | POA: Diagnosis not present

## 2022-04-02 DIAGNOSIS — N139 Obstructive and reflux uropathy, unspecified: Secondary | ICD-10-CM | POA: Diagnosis not present

## 2022-04-02 DIAGNOSIS — R262 Difficulty in walking, not elsewhere classified: Secondary | ICD-10-CM | POA: Diagnosis not present

## 2022-04-03 DIAGNOSIS — R69 Illness, unspecified: Secondary | ICD-10-CM | POA: Diagnosis not present

## 2022-04-03 DIAGNOSIS — N139 Obstructive and reflux uropathy, unspecified: Secondary | ICD-10-CM | POA: Diagnosis not present

## 2022-04-03 DIAGNOSIS — N179 Acute kidney failure, unspecified: Secondary | ICD-10-CM | POA: Diagnosis not present

## 2022-04-03 DIAGNOSIS — R262 Difficulty in walking, not elsewhere classified: Secondary | ICD-10-CM | POA: Diagnosis not present

## 2022-04-03 DIAGNOSIS — M6281 Muscle weakness (generalized): Secondary | ICD-10-CM | POA: Diagnosis not present

## 2022-04-03 DIAGNOSIS — F321 Major depressive disorder, single episode, moderate: Secondary | ICD-10-CM | POA: Diagnosis not present

## 2022-04-04 DIAGNOSIS — R262 Difficulty in walking, not elsewhere classified: Secondary | ICD-10-CM | POA: Diagnosis not present

## 2022-04-04 DIAGNOSIS — M6281 Muscle weakness (generalized): Secondary | ICD-10-CM | POA: Diagnosis not present

## 2022-04-04 DIAGNOSIS — N179 Acute kidney failure, unspecified: Secondary | ICD-10-CM | POA: Diagnosis not present

## 2022-04-04 DIAGNOSIS — N139 Obstructive and reflux uropathy, unspecified: Secondary | ICD-10-CM | POA: Diagnosis not present

## 2022-04-07 DIAGNOSIS — F321 Major depressive disorder, single episode, moderate: Secondary | ICD-10-CM | POA: Diagnosis not present

## 2022-04-07 DIAGNOSIS — R262 Difficulty in walking, not elsewhere classified: Secondary | ICD-10-CM | POA: Diagnosis not present

## 2022-04-07 DIAGNOSIS — R69 Illness, unspecified: Secondary | ICD-10-CM | POA: Diagnosis not present

## 2022-04-07 DIAGNOSIS — N139 Obstructive and reflux uropathy, unspecified: Secondary | ICD-10-CM | POA: Diagnosis not present

## 2022-04-07 DIAGNOSIS — M6281 Muscle weakness (generalized): Secondary | ICD-10-CM | POA: Diagnosis not present

## 2022-04-07 DIAGNOSIS — N179 Acute kidney failure, unspecified: Secondary | ICD-10-CM | POA: Diagnosis not present

## 2022-04-08 DIAGNOSIS — N179 Acute kidney failure, unspecified: Secondary | ICD-10-CM | POA: Diagnosis not present

## 2022-04-08 DIAGNOSIS — M6281 Muscle weakness (generalized): Secondary | ICD-10-CM | POA: Diagnosis not present

## 2022-04-08 DIAGNOSIS — N139 Obstructive and reflux uropathy, unspecified: Secondary | ICD-10-CM | POA: Diagnosis not present

## 2022-04-08 DIAGNOSIS — R262 Difficulty in walking, not elsewhere classified: Secondary | ICD-10-CM | POA: Diagnosis not present

## 2022-04-09 DIAGNOSIS — N139 Obstructive and reflux uropathy, unspecified: Secondary | ICD-10-CM | POA: Diagnosis not present

## 2022-04-09 DIAGNOSIS — M6281 Muscle weakness (generalized): Secondary | ICD-10-CM | POA: Diagnosis not present

## 2022-04-09 DIAGNOSIS — R262 Difficulty in walking, not elsewhere classified: Secondary | ICD-10-CM | POA: Diagnosis not present

## 2022-04-09 DIAGNOSIS — N179 Acute kidney failure, unspecified: Secondary | ICD-10-CM | POA: Diagnosis not present

## 2022-04-10 DIAGNOSIS — N139 Obstructive and reflux uropathy, unspecified: Secondary | ICD-10-CM | POA: Diagnosis not present

## 2022-04-10 DIAGNOSIS — R262 Difficulty in walking, not elsewhere classified: Secondary | ICD-10-CM | POA: Diagnosis not present

## 2022-04-10 DIAGNOSIS — N179 Acute kidney failure, unspecified: Secondary | ICD-10-CM | POA: Diagnosis not present

## 2022-04-10 DIAGNOSIS — M6281 Muscle weakness (generalized): Secondary | ICD-10-CM | POA: Diagnosis not present

## 2022-04-11 DIAGNOSIS — E785 Hyperlipidemia, unspecified: Secondary | ICD-10-CM | POA: Diagnosis not present

## 2022-04-11 DIAGNOSIS — M6281 Muscle weakness (generalized): Secondary | ICD-10-CM | POA: Diagnosis not present

## 2022-04-11 DIAGNOSIS — N139 Obstructive and reflux uropathy, unspecified: Secondary | ICD-10-CM | POA: Diagnosis not present

## 2022-04-11 DIAGNOSIS — E119 Type 2 diabetes mellitus without complications: Secondary | ICD-10-CM | POA: Diagnosis not present

## 2022-04-11 DIAGNOSIS — R262 Difficulty in walking, not elsewhere classified: Secondary | ICD-10-CM | POA: Diagnosis not present

## 2022-04-11 DIAGNOSIS — I10 Essential (primary) hypertension: Secondary | ICD-10-CM | POA: Diagnosis not present

## 2022-04-11 DIAGNOSIS — N179 Acute kidney failure, unspecified: Secondary | ICD-10-CM | POA: Diagnosis not present

## 2022-04-14 DIAGNOSIS — R339 Retention of urine, unspecified: Secondary | ICD-10-CM | POA: Diagnosis not present

## 2022-04-14 DIAGNOSIS — N179 Acute kidney failure, unspecified: Secondary | ICD-10-CM | POA: Diagnosis not present

## 2022-04-14 DIAGNOSIS — R262 Difficulty in walking, not elsewhere classified: Secondary | ICD-10-CM | POA: Diagnosis not present

## 2022-04-14 DIAGNOSIS — N139 Obstructive and reflux uropathy, unspecified: Secondary | ICD-10-CM | POA: Diagnosis not present

## 2022-04-14 DIAGNOSIS — M6281 Muscle weakness (generalized): Secondary | ICD-10-CM | POA: Diagnosis not present

## 2022-04-15 DIAGNOSIS — N179 Acute kidney failure, unspecified: Secondary | ICD-10-CM | POA: Diagnosis not present

## 2022-04-15 DIAGNOSIS — M6281 Muscle weakness (generalized): Secondary | ICD-10-CM | POA: Diagnosis not present

## 2022-04-15 DIAGNOSIS — N139 Obstructive and reflux uropathy, unspecified: Secondary | ICD-10-CM | POA: Diagnosis not present

## 2022-04-15 DIAGNOSIS — R262 Difficulty in walking, not elsewhere classified: Secondary | ICD-10-CM | POA: Diagnosis not present

## 2022-04-16 DIAGNOSIS — M6281 Muscle weakness (generalized): Secondary | ICD-10-CM | POA: Diagnosis not present

## 2022-04-16 DIAGNOSIS — N179 Acute kidney failure, unspecified: Secondary | ICD-10-CM | POA: Diagnosis not present

## 2022-04-16 DIAGNOSIS — R69 Illness, unspecified: Secondary | ICD-10-CM | POA: Diagnosis not present

## 2022-04-16 DIAGNOSIS — F321 Major depressive disorder, single episode, moderate: Secondary | ICD-10-CM | POA: Diagnosis not present

## 2022-04-16 DIAGNOSIS — N139 Obstructive and reflux uropathy, unspecified: Secondary | ICD-10-CM | POA: Diagnosis not present

## 2022-04-16 DIAGNOSIS — R262 Difficulty in walking, not elsewhere classified: Secondary | ICD-10-CM | POA: Diagnosis not present

## 2022-04-17 DIAGNOSIS — N139 Obstructive and reflux uropathy, unspecified: Secondary | ICD-10-CM | POA: Diagnosis not present

## 2022-04-17 DIAGNOSIS — R262 Difficulty in walking, not elsewhere classified: Secondary | ICD-10-CM | POA: Diagnosis not present

## 2022-04-17 DIAGNOSIS — M6281 Muscle weakness (generalized): Secondary | ICD-10-CM | POA: Diagnosis not present

## 2022-04-17 DIAGNOSIS — N179 Acute kidney failure, unspecified: Secondary | ICD-10-CM | POA: Diagnosis not present

## 2022-04-18 DIAGNOSIS — N179 Acute kidney failure, unspecified: Secondary | ICD-10-CM | POA: Diagnosis not present

## 2022-04-18 DIAGNOSIS — M6281 Muscle weakness (generalized): Secondary | ICD-10-CM | POA: Diagnosis not present

## 2022-04-18 DIAGNOSIS — R262 Difficulty in walking, not elsewhere classified: Secondary | ICD-10-CM | POA: Diagnosis not present

## 2022-04-18 DIAGNOSIS — N139 Obstructive and reflux uropathy, unspecified: Secondary | ICD-10-CM | POA: Diagnosis not present

## 2022-04-21 DIAGNOSIS — N139 Obstructive and reflux uropathy, unspecified: Secondary | ICD-10-CM | POA: Diagnosis not present

## 2022-04-21 DIAGNOSIS — R262 Difficulty in walking, not elsewhere classified: Secondary | ICD-10-CM | POA: Diagnosis not present

## 2022-04-21 DIAGNOSIS — M6281 Muscle weakness (generalized): Secondary | ICD-10-CM | POA: Diagnosis not present

## 2022-04-21 DIAGNOSIS — R69 Illness, unspecified: Secondary | ICD-10-CM | POA: Diagnosis not present

## 2022-04-21 DIAGNOSIS — E119 Type 2 diabetes mellitus without complications: Secondary | ICD-10-CM | POA: Diagnosis not present

## 2022-04-21 DIAGNOSIS — C61 Malignant neoplasm of prostate: Secondary | ICD-10-CM | POA: Diagnosis not present

## 2022-04-21 DIAGNOSIS — I10 Essential (primary) hypertension: Secondary | ICD-10-CM | POA: Diagnosis not present

## 2022-04-21 DIAGNOSIS — E785 Hyperlipidemia, unspecified: Secondary | ICD-10-CM | POA: Diagnosis not present

## 2022-04-21 DIAGNOSIS — F321 Major depressive disorder, single episode, moderate: Secondary | ICD-10-CM | POA: Diagnosis not present

## 2022-04-21 DIAGNOSIS — N179 Acute kidney failure, unspecified: Secondary | ICD-10-CM | POA: Diagnosis not present

## 2022-04-22 DIAGNOSIS — N139 Obstructive and reflux uropathy, unspecified: Secondary | ICD-10-CM | POA: Diagnosis not present

## 2022-04-22 DIAGNOSIS — R262 Difficulty in walking, not elsewhere classified: Secondary | ICD-10-CM | POA: Diagnosis not present

## 2022-04-22 DIAGNOSIS — N179 Acute kidney failure, unspecified: Secondary | ICD-10-CM | POA: Diagnosis not present

## 2022-04-22 DIAGNOSIS — M6281 Muscle weakness (generalized): Secondary | ICD-10-CM | POA: Diagnosis not present

## 2022-04-23 DIAGNOSIS — R262 Difficulty in walking, not elsewhere classified: Secondary | ICD-10-CM | POA: Diagnosis not present

## 2022-04-23 DIAGNOSIS — M6281 Muscle weakness (generalized): Secondary | ICD-10-CM | POA: Diagnosis not present

## 2022-04-23 DIAGNOSIS — N139 Obstructive and reflux uropathy, unspecified: Secondary | ICD-10-CM | POA: Diagnosis not present

## 2022-04-23 DIAGNOSIS — N179 Acute kidney failure, unspecified: Secondary | ICD-10-CM | POA: Diagnosis not present

## 2022-04-24 DIAGNOSIS — N179 Acute kidney failure, unspecified: Secondary | ICD-10-CM | POA: Diagnosis not present

## 2022-04-24 DIAGNOSIS — N139 Obstructive and reflux uropathy, unspecified: Secondary | ICD-10-CM | POA: Diagnosis not present

## 2022-04-24 DIAGNOSIS — R262 Difficulty in walking, not elsewhere classified: Secondary | ICD-10-CM | POA: Diagnosis not present

## 2022-04-24 DIAGNOSIS — M6281 Muscle weakness (generalized): Secondary | ICD-10-CM | POA: Diagnosis not present

## 2022-04-25 DIAGNOSIS — N179 Acute kidney failure, unspecified: Secondary | ICD-10-CM | POA: Diagnosis not present

## 2022-04-25 DIAGNOSIS — R262 Difficulty in walking, not elsewhere classified: Secondary | ICD-10-CM | POA: Diagnosis not present

## 2022-04-25 DIAGNOSIS — M6281 Muscle weakness (generalized): Secondary | ICD-10-CM | POA: Diagnosis not present

## 2022-04-25 DIAGNOSIS — N139 Obstructive and reflux uropathy, unspecified: Secondary | ICD-10-CM | POA: Diagnosis not present

## 2022-04-28 DIAGNOSIS — N179 Acute kidney failure, unspecified: Secondary | ICD-10-CM | POA: Diagnosis not present

## 2022-04-28 DIAGNOSIS — M6281 Muscle weakness (generalized): Secondary | ICD-10-CM | POA: Diagnosis not present

## 2022-04-28 DIAGNOSIS — R262 Difficulty in walking, not elsewhere classified: Secondary | ICD-10-CM | POA: Diagnosis not present

## 2022-04-28 DIAGNOSIS — N139 Obstructive and reflux uropathy, unspecified: Secondary | ICD-10-CM | POA: Diagnosis not present

## 2022-04-29 ENCOUNTER — Ambulatory Visit: Payer: Medicare HMO | Admitting: Family Medicine

## 2022-04-30 DIAGNOSIS — N139 Obstructive and reflux uropathy, unspecified: Secondary | ICD-10-CM | POA: Diagnosis not present

## 2022-04-30 DIAGNOSIS — N179 Acute kidney failure, unspecified: Secondary | ICD-10-CM | POA: Diagnosis not present

## 2022-04-30 DIAGNOSIS — R69 Illness, unspecified: Secondary | ICD-10-CM | POA: Diagnosis not present

## 2022-04-30 DIAGNOSIS — C61 Malignant neoplasm of prostate: Secondary | ICD-10-CM | POA: Diagnosis not present

## 2022-04-30 DIAGNOSIS — F321 Major depressive disorder, single episode, moderate: Secondary | ICD-10-CM | POA: Diagnosis not present

## 2022-04-30 DIAGNOSIS — M6281 Muscle weakness (generalized): Secondary | ICD-10-CM | POA: Diagnosis not present

## 2022-04-30 DIAGNOSIS — R262 Difficulty in walking, not elsewhere classified: Secondary | ICD-10-CM | POA: Diagnosis not present

## 2022-05-01 DIAGNOSIS — R262 Difficulty in walking, not elsewhere classified: Secondary | ICD-10-CM | POA: Diagnosis not present

## 2022-05-01 DIAGNOSIS — R69 Illness, unspecified: Secondary | ICD-10-CM | POA: Diagnosis not present

## 2022-05-01 DIAGNOSIS — I10 Essential (primary) hypertension: Secondary | ICD-10-CM | POA: Diagnosis not present

## 2022-05-01 DIAGNOSIS — C61 Malignant neoplasm of prostate: Secondary | ICD-10-CM | POA: Diagnosis not present

## 2022-05-01 DIAGNOSIS — N139 Obstructive and reflux uropathy, unspecified: Secondary | ICD-10-CM | POA: Diagnosis not present

## 2022-05-01 DIAGNOSIS — E119 Type 2 diabetes mellitus without complications: Secondary | ICD-10-CM | POA: Diagnosis not present

## 2022-05-01 DIAGNOSIS — M6281 Muscle weakness (generalized): Secondary | ICD-10-CM | POA: Diagnosis not present

## 2022-05-01 DIAGNOSIS — N179 Acute kidney failure, unspecified: Secondary | ICD-10-CM | POA: Diagnosis not present

## 2022-05-01 DIAGNOSIS — E785 Hyperlipidemia, unspecified: Secondary | ICD-10-CM | POA: Diagnosis not present

## 2022-05-02 DIAGNOSIS — E1165 Type 2 diabetes mellitus with hyperglycemia: Secondary | ICD-10-CM | POA: Diagnosis not present

## 2022-05-02 DIAGNOSIS — I10 Essential (primary) hypertension: Secondary | ICD-10-CM | POA: Diagnosis not present

## 2022-05-03 DIAGNOSIS — M6281 Muscle weakness (generalized): Secondary | ICD-10-CM | POA: Diagnosis not present

## 2022-05-03 DIAGNOSIS — R262 Difficulty in walking, not elsewhere classified: Secondary | ICD-10-CM | POA: Diagnosis not present

## 2022-05-03 DIAGNOSIS — N139 Obstructive and reflux uropathy, unspecified: Secondary | ICD-10-CM | POA: Diagnosis not present

## 2022-05-03 DIAGNOSIS — N179 Acute kidney failure, unspecified: Secondary | ICD-10-CM | POA: Diagnosis not present

## 2022-05-05 DIAGNOSIS — N179 Acute kidney failure, unspecified: Secondary | ICD-10-CM | POA: Diagnosis not present

## 2022-05-05 DIAGNOSIS — R262 Difficulty in walking, not elsewhere classified: Secondary | ICD-10-CM | POA: Diagnosis not present

## 2022-05-05 DIAGNOSIS — M6281 Muscle weakness (generalized): Secondary | ICD-10-CM | POA: Diagnosis not present

## 2022-05-05 DIAGNOSIS — N139 Obstructive and reflux uropathy, unspecified: Secondary | ICD-10-CM | POA: Diagnosis not present

## 2022-05-06 DIAGNOSIS — R262 Difficulty in walking, not elsewhere classified: Secondary | ICD-10-CM | POA: Diagnosis not present

## 2022-05-06 DIAGNOSIS — M6281 Muscle weakness (generalized): Secondary | ICD-10-CM | POA: Diagnosis not present

## 2022-05-06 DIAGNOSIS — N139 Obstructive and reflux uropathy, unspecified: Secondary | ICD-10-CM | POA: Diagnosis not present

## 2022-05-06 DIAGNOSIS — N179 Acute kidney failure, unspecified: Secondary | ICD-10-CM | POA: Diagnosis not present

## 2022-05-07 DIAGNOSIS — M6281 Muscle weakness (generalized): Secondary | ICD-10-CM | POA: Diagnosis not present

## 2022-05-07 DIAGNOSIS — C61 Malignant neoplasm of prostate: Secondary | ICD-10-CM | POA: Diagnosis not present

## 2022-05-07 DIAGNOSIS — R262 Difficulty in walking, not elsewhere classified: Secondary | ICD-10-CM | POA: Diagnosis not present

## 2022-05-07 DIAGNOSIS — R338 Other retention of urine: Secondary | ICD-10-CM | POA: Diagnosis not present

## 2022-05-07 DIAGNOSIS — N179 Acute kidney failure, unspecified: Secondary | ICD-10-CM | POA: Diagnosis not present

## 2022-05-07 DIAGNOSIS — N401 Enlarged prostate with lower urinary tract symptoms: Secondary | ICD-10-CM | POA: Diagnosis not present

## 2022-05-07 DIAGNOSIS — N139 Obstructive and reflux uropathy, unspecified: Secondary | ICD-10-CM | POA: Diagnosis not present

## 2022-05-12 DIAGNOSIS — M6281 Muscle weakness (generalized): Secondary | ICD-10-CM | POA: Diagnosis not present

## 2022-05-12 DIAGNOSIS — N139 Obstructive and reflux uropathy, unspecified: Secondary | ICD-10-CM | POA: Diagnosis not present

## 2022-05-12 DIAGNOSIS — R262 Difficulty in walking, not elsewhere classified: Secondary | ICD-10-CM | POA: Diagnosis not present

## 2022-05-12 DIAGNOSIS — N179 Acute kidney failure, unspecified: Secondary | ICD-10-CM | POA: Diagnosis not present

## 2022-05-12 DIAGNOSIS — R339 Retention of urine, unspecified: Secondary | ICD-10-CM | POA: Diagnosis not present

## 2022-05-13 DIAGNOSIS — E785 Hyperlipidemia, unspecified: Secondary | ICD-10-CM | POA: Diagnosis not present

## 2022-05-13 DIAGNOSIS — R69 Illness, unspecified: Secondary | ICD-10-CM | POA: Diagnosis not present

## 2022-05-13 DIAGNOSIS — N139 Obstructive and reflux uropathy, unspecified: Secondary | ICD-10-CM | POA: Diagnosis not present

## 2022-05-13 DIAGNOSIS — M6281 Muscle weakness (generalized): Secondary | ICD-10-CM | POA: Diagnosis not present

## 2022-05-13 DIAGNOSIS — E119 Type 2 diabetes mellitus without complications: Secondary | ICD-10-CM | POA: Diagnosis not present

## 2022-05-13 DIAGNOSIS — I10 Essential (primary) hypertension: Secondary | ICD-10-CM | POA: Diagnosis not present

## 2022-05-13 DIAGNOSIS — R262 Difficulty in walking, not elsewhere classified: Secondary | ICD-10-CM | POA: Diagnosis not present

## 2022-05-13 DIAGNOSIS — F321 Major depressive disorder, single episode, moderate: Secondary | ICD-10-CM | POA: Diagnosis not present

## 2022-05-13 DIAGNOSIS — N179 Acute kidney failure, unspecified: Secondary | ICD-10-CM | POA: Diagnosis not present

## 2022-05-14 DIAGNOSIS — M6281 Muscle weakness (generalized): Secondary | ICD-10-CM | POA: Diagnosis not present

## 2022-05-14 DIAGNOSIS — R262 Difficulty in walking, not elsewhere classified: Secondary | ICD-10-CM | POA: Diagnosis not present

## 2022-05-14 DIAGNOSIS — N179 Acute kidney failure, unspecified: Secondary | ICD-10-CM | POA: Diagnosis not present

## 2022-05-14 DIAGNOSIS — N139 Obstructive and reflux uropathy, unspecified: Secondary | ICD-10-CM | POA: Diagnosis not present

## 2022-05-16 DIAGNOSIS — N179 Acute kidney failure, unspecified: Secondary | ICD-10-CM | POA: Diagnosis not present

## 2022-05-16 DIAGNOSIS — R262 Difficulty in walking, not elsewhere classified: Secondary | ICD-10-CM | POA: Diagnosis not present

## 2022-05-16 DIAGNOSIS — N139 Obstructive and reflux uropathy, unspecified: Secondary | ICD-10-CM | POA: Diagnosis not present

## 2022-05-16 DIAGNOSIS — M6281 Muscle weakness (generalized): Secondary | ICD-10-CM | POA: Diagnosis not present

## 2022-05-19 DIAGNOSIS — R69 Illness, unspecified: Secondary | ICD-10-CM | POA: Diagnosis not present

## 2022-05-19 DIAGNOSIS — I10 Essential (primary) hypertension: Secondary | ICD-10-CM | POA: Diagnosis not present

## 2022-05-19 DIAGNOSIS — R262 Difficulty in walking, not elsewhere classified: Secondary | ICD-10-CM | POA: Diagnosis not present

## 2022-05-19 DIAGNOSIS — E119 Type 2 diabetes mellitus without complications: Secondary | ICD-10-CM | POA: Diagnosis not present

## 2022-05-19 DIAGNOSIS — R609 Edema, unspecified: Secondary | ICD-10-CM | POA: Diagnosis not present

## 2022-05-19 DIAGNOSIS — C61 Malignant neoplasm of prostate: Secondary | ICD-10-CM | POA: Diagnosis not present

## 2022-05-19 DIAGNOSIS — N139 Obstructive and reflux uropathy, unspecified: Secondary | ICD-10-CM | POA: Diagnosis not present

## 2022-05-19 DIAGNOSIS — M6281 Muscle weakness (generalized): Secondary | ICD-10-CM | POA: Diagnosis not present

## 2022-05-19 DIAGNOSIS — N179 Acute kidney failure, unspecified: Secondary | ICD-10-CM | POA: Diagnosis not present

## 2022-05-20 DIAGNOSIS — M6281 Muscle weakness (generalized): Secondary | ICD-10-CM | POA: Diagnosis not present

## 2022-05-20 DIAGNOSIS — R262 Difficulty in walking, not elsewhere classified: Secondary | ICD-10-CM | POA: Diagnosis not present

## 2022-05-20 DIAGNOSIS — N139 Obstructive and reflux uropathy, unspecified: Secondary | ICD-10-CM | POA: Diagnosis not present

## 2022-05-20 DIAGNOSIS — N179 Acute kidney failure, unspecified: Secondary | ICD-10-CM | POA: Diagnosis not present

## 2022-05-21 DIAGNOSIS — N139 Obstructive and reflux uropathy, unspecified: Secondary | ICD-10-CM | POA: Diagnosis not present

## 2022-05-21 DIAGNOSIS — N179 Acute kidney failure, unspecified: Secondary | ICD-10-CM | POA: Diagnosis not present

## 2022-05-21 DIAGNOSIS — R262 Difficulty in walking, not elsewhere classified: Secondary | ICD-10-CM | POA: Diagnosis not present

## 2022-05-21 DIAGNOSIS — M6281 Muscle weakness (generalized): Secondary | ICD-10-CM | POA: Diagnosis not present

## 2022-05-22 DIAGNOSIS — N139 Obstructive and reflux uropathy, unspecified: Secondary | ICD-10-CM | POA: Diagnosis not present

## 2022-05-22 DIAGNOSIS — M6281 Muscle weakness (generalized): Secondary | ICD-10-CM | POA: Diagnosis not present

## 2022-05-22 DIAGNOSIS — R262 Difficulty in walking, not elsewhere classified: Secondary | ICD-10-CM | POA: Diagnosis not present

## 2022-05-22 DIAGNOSIS — N179 Acute kidney failure, unspecified: Secondary | ICD-10-CM | POA: Diagnosis not present

## 2022-05-23 DIAGNOSIS — N179 Acute kidney failure, unspecified: Secondary | ICD-10-CM | POA: Diagnosis not present

## 2022-05-23 DIAGNOSIS — M6281 Muscle weakness (generalized): Secondary | ICD-10-CM | POA: Diagnosis not present

## 2022-05-23 DIAGNOSIS — R262 Difficulty in walking, not elsewhere classified: Secondary | ICD-10-CM | POA: Diagnosis not present

## 2022-05-23 DIAGNOSIS — N139 Obstructive and reflux uropathy, unspecified: Secondary | ICD-10-CM | POA: Diagnosis not present

## 2022-05-27 DIAGNOSIS — R69 Illness, unspecified: Secondary | ICD-10-CM | POA: Diagnosis not present

## 2022-05-27 DIAGNOSIS — R262 Difficulty in walking, not elsewhere classified: Secondary | ICD-10-CM | POA: Diagnosis not present

## 2022-05-27 DIAGNOSIS — N179 Acute kidney failure, unspecified: Secondary | ICD-10-CM | POA: Diagnosis not present

## 2022-05-27 DIAGNOSIS — F321 Major depressive disorder, single episode, moderate: Secondary | ICD-10-CM | POA: Diagnosis not present

## 2022-05-27 DIAGNOSIS — M6281 Muscle weakness (generalized): Secondary | ICD-10-CM | POA: Diagnosis not present

## 2022-05-27 DIAGNOSIS — N139 Obstructive and reflux uropathy, unspecified: Secondary | ICD-10-CM | POA: Diagnosis not present

## 2022-05-28 DIAGNOSIS — R262 Difficulty in walking, not elsewhere classified: Secondary | ICD-10-CM | POA: Diagnosis not present

## 2022-05-28 DIAGNOSIS — M6281 Muscle weakness (generalized): Secondary | ICD-10-CM | POA: Diagnosis not present

## 2022-05-28 DIAGNOSIS — N179 Acute kidney failure, unspecified: Secondary | ICD-10-CM | POA: Diagnosis not present

## 2022-05-28 DIAGNOSIS — N139 Obstructive and reflux uropathy, unspecified: Secondary | ICD-10-CM | POA: Diagnosis not present

## 2022-05-29 DIAGNOSIS — R262 Difficulty in walking, not elsewhere classified: Secondary | ICD-10-CM | POA: Diagnosis not present

## 2022-05-29 DIAGNOSIS — N139 Obstructive and reflux uropathy, unspecified: Secondary | ICD-10-CM | POA: Diagnosis not present

## 2022-05-29 DIAGNOSIS — M6281 Muscle weakness (generalized): Secondary | ICD-10-CM | POA: Diagnosis not present

## 2022-05-29 DIAGNOSIS — N179 Acute kidney failure, unspecified: Secondary | ICD-10-CM | POA: Diagnosis not present

## 2022-05-30 DIAGNOSIS — N401 Enlarged prostate with lower urinary tract symptoms: Secondary | ICD-10-CM | POA: Diagnosis not present

## 2022-05-30 DIAGNOSIS — N139 Obstructive and reflux uropathy, unspecified: Secondary | ICD-10-CM | POA: Diagnosis not present

## 2022-05-30 DIAGNOSIS — R338 Other retention of urine: Secondary | ICD-10-CM | POA: Diagnosis not present

## 2022-05-30 DIAGNOSIS — C61 Malignant neoplasm of prostate: Secondary | ICD-10-CM | POA: Diagnosis not present

## 2022-05-30 DIAGNOSIS — R262 Difficulty in walking, not elsewhere classified: Secondary | ICD-10-CM | POA: Diagnosis not present

## 2022-05-30 DIAGNOSIS — N179 Acute kidney failure, unspecified: Secondary | ICD-10-CM | POA: Diagnosis not present

## 2022-05-30 DIAGNOSIS — M6281 Muscle weakness (generalized): Secondary | ICD-10-CM | POA: Diagnosis not present

## 2022-05-30 DIAGNOSIS — R3914 Feeling of incomplete bladder emptying: Secondary | ICD-10-CM | POA: Diagnosis not present

## 2022-06-02 DIAGNOSIS — N179 Acute kidney failure, unspecified: Secondary | ICD-10-CM | POA: Diagnosis not present

## 2022-06-02 DIAGNOSIS — N139 Obstructive and reflux uropathy, unspecified: Secondary | ICD-10-CM | POA: Diagnosis not present

## 2022-06-02 DIAGNOSIS — R262 Difficulty in walking, not elsewhere classified: Secondary | ICD-10-CM | POA: Diagnosis not present

## 2022-06-02 DIAGNOSIS — R69 Illness, unspecified: Secondary | ICD-10-CM | POA: Diagnosis not present

## 2022-06-02 DIAGNOSIS — F321 Major depressive disorder, single episode, moderate: Secondary | ICD-10-CM | POA: Diagnosis not present

## 2022-06-02 DIAGNOSIS — M6281 Muscle weakness (generalized): Secondary | ICD-10-CM | POA: Diagnosis not present

## 2022-06-03 DIAGNOSIS — M6281 Muscle weakness (generalized): Secondary | ICD-10-CM | POA: Diagnosis not present

## 2022-06-03 DIAGNOSIS — N139 Obstructive and reflux uropathy, unspecified: Secondary | ICD-10-CM | POA: Diagnosis not present

## 2022-06-03 DIAGNOSIS — R262 Difficulty in walking, not elsewhere classified: Secondary | ICD-10-CM | POA: Diagnosis not present

## 2022-06-03 DIAGNOSIS — N179 Acute kidney failure, unspecified: Secondary | ICD-10-CM | POA: Diagnosis not present

## 2022-06-04 DIAGNOSIS — M6281 Muscle weakness (generalized): Secondary | ICD-10-CM | POA: Diagnosis not present

## 2022-06-04 DIAGNOSIS — T464X5A Adverse effect of angiotensin-converting-enzyme inhibitors, initial encounter: Secondary | ICD-10-CM | POA: Diagnosis not present

## 2022-06-04 DIAGNOSIS — T783XXA Angioneurotic edema, initial encounter: Secondary | ICD-10-CM | POA: Diagnosis not present

## 2022-06-04 DIAGNOSIS — N139 Obstructive and reflux uropathy, unspecified: Secondary | ICD-10-CM | POA: Diagnosis not present

## 2022-06-04 DIAGNOSIS — N179 Acute kidney failure, unspecified: Secondary | ICD-10-CM | POA: Diagnosis not present

## 2022-06-04 DIAGNOSIS — R262 Difficulty in walking, not elsewhere classified: Secondary | ICD-10-CM | POA: Diagnosis not present

## 2022-06-05 DIAGNOSIS — N139 Obstructive and reflux uropathy, unspecified: Secondary | ICD-10-CM | POA: Diagnosis not present

## 2022-06-05 DIAGNOSIS — N179 Acute kidney failure, unspecified: Secondary | ICD-10-CM | POA: Diagnosis not present

## 2022-06-05 DIAGNOSIS — R609 Edema, unspecified: Secondary | ICD-10-CM | POA: Diagnosis not present

## 2022-06-05 DIAGNOSIS — C61 Malignant neoplasm of prostate: Secondary | ICD-10-CM | POA: Diagnosis not present

## 2022-06-05 DIAGNOSIS — M6281 Muscle weakness (generalized): Secondary | ICD-10-CM | POA: Diagnosis not present

## 2022-06-05 DIAGNOSIS — R262 Difficulty in walking, not elsewhere classified: Secondary | ICD-10-CM | POA: Diagnosis not present

## 2022-06-05 DIAGNOSIS — R69 Illness, unspecified: Secondary | ICD-10-CM | POA: Diagnosis not present

## 2022-06-05 DIAGNOSIS — I10 Essential (primary) hypertension: Secondary | ICD-10-CM | POA: Diagnosis not present

## 2022-06-05 DIAGNOSIS — E119 Type 2 diabetes mellitus without complications: Secondary | ICD-10-CM | POA: Diagnosis not present

## 2022-06-06 DIAGNOSIS — N179 Acute kidney failure, unspecified: Secondary | ICD-10-CM | POA: Diagnosis not present

## 2022-06-06 DIAGNOSIS — R262 Difficulty in walking, not elsewhere classified: Secondary | ICD-10-CM | POA: Diagnosis not present

## 2022-06-06 DIAGNOSIS — M6281 Muscle weakness (generalized): Secondary | ICD-10-CM | POA: Diagnosis not present

## 2022-06-06 DIAGNOSIS — N139 Obstructive and reflux uropathy, unspecified: Secondary | ICD-10-CM | POA: Diagnosis not present

## 2022-06-09 ENCOUNTER — Telehealth: Payer: Self-pay | Admitting: Podiatry

## 2022-06-09 ENCOUNTER — Ambulatory Visit: Payer: Medicare HMO | Admitting: Podiatry

## 2022-06-09 DIAGNOSIS — E1165 Type 2 diabetes mellitus with hyperglycemia: Secondary | ICD-10-CM | POA: Diagnosis not present

## 2022-06-09 DIAGNOSIS — N179 Acute kidney failure, unspecified: Secondary | ICD-10-CM | POA: Diagnosis not present

## 2022-06-09 DIAGNOSIS — R69 Illness, unspecified: Secondary | ICD-10-CM | POA: Diagnosis not present

## 2022-06-09 DIAGNOSIS — R262 Difficulty in walking, not elsewhere classified: Secondary | ICD-10-CM | POA: Diagnosis not present

## 2022-06-09 DIAGNOSIS — F321 Major depressive disorder, single episode, moderate: Secondary | ICD-10-CM | POA: Diagnosis not present

## 2022-06-09 DIAGNOSIS — N139 Obstructive and reflux uropathy, unspecified: Secondary | ICD-10-CM | POA: Diagnosis not present

## 2022-06-09 DIAGNOSIS — R339 Retention of urine, unspecified: Secondary | ICD-10-CM | POA: Diagnosis not present

## 2022-06-09 DIAGNOSIS — M6281 Muscle weakness (generalized): Secondary | ICD-10-CM | POA: Diagnosis not present

## 2022-06-09 NOTE — Telephone Encounter (Signed)
Pt left 2 messages to cancel his appt for today( one at 300am and 304am) . I have cxled appt and left message for pt to call to r/s when needed.

## 2022-06-10 DIAGNOSIS — R262 Difficulty in walking, not elsewhere classified: Secondary | ICD-10-CM | POA: Diagnosis not present

## 2022-06-10 DIAGNOSIS — N179 Acute kidney failure, unspecified: Secondary | ICD-10-CM | POA: Diagnosis not present

## 2022-06-10 DIAGNOSIS — M6281 Muscle weakness (generalized): Secondary | ICD-10-CM | POA: Diagnosis not present

## 2022-06-10 DIAGNOSIS — N139 Obstructive and reflux uropathy, unspecified: Secondary | ICD-10-CM | POA: Diagnosis not present

## 2022-06-11 DIAGNOSIS — N139 Obstructive and reflux uropathy, unspecified: Secondary | ICD-10-CM | POA: Diagnosis not present

## 2022-06-11 DIAGNOSIS — M6281 Muscle weakness (generalized): Secondary | ICD-10-CM | POA: Diagnosis not present

## 2022-06-11 DIAGNOSIS — N179 Acute kidney failure, unspecified: Secondary | ICD-10-CM | POA: Diagnosis not present

## 2022-06-11 DIAGNOSIS — R262 Difficulty in walking, not elsewhere classified: Secondary | ICD-10-CM | POA: Diagnosis not present

## 2022-06-12 DIAGNOSIS — N139 Obstructive and reflux uropathy, unspecified: Secondary | ICD-10-CM | POA: Diagnosis not present

## 2022-06-12 DIAGNOSIS — N179 Acute kidney failure, unspecified: Secondary | ICD-10-CM | POA: Diagnosis not present

## 2022-06-12 DIAGNOSIS — M6281 Muscle weakness (generalized): Secondary | ICD-10-CM | POA: Diagnosis not present

## 2022-06-12 DIAGNOSIS — R262 Difficulty in walking, not elsewhere classified: Secondary | ICD-10-CM | POA: Diagnosis not present

## 2022-06-13 DIAGNOSIS — M6281 Muscle weakness (generalized): Secondary | ICD-10-CM | POA: Diagnosis not present

## 2022-06-13 DIAGNOSIS — N139 Obstructive and reflux uropathy, unspecified: Secondary | ICD-10-CM | POA: Diagnosis not present

## 2022-06-13 DIAGNOSIS — N179 Acute kidney failure, unspecified: Secondary | ICD-10-CM | POA: Diagnosis not present

## 2022-06-13 DIAGNOSIS — R262 Difficulty in walking, not elsewhere classified: Secondary | ICD-10-CM | POA: Diagnosis not present

## 2022-06-16 DIAGNOSIS — M6281 Muscle weakness (generalized): Secondary | ICD-10-CM | POA: Diagnosis not present

## 2022-06-16 DIAGNOSIS — N139 Obstructive and reflux uropathy, unspecified: Secondary | ICD-10-CM | POA: Diagnosis not present

## 2022-06-16 DIAGNOSIS — E119 Type 2 diabetes mellitus without complications: Secondary | ICD-10-CM | POA: Diagnosis not present

## 2022-06-16 DIAGNOSIS — R69 Illness, unspecified: Secondary | ICD-10-CM | POA: Diagnosis not present

## 2022-06-16 DIAGNOSIS — N179 Acute kidney failure, unspecified: Secondary | ICD-10-CM | POA: Diagnosis not present

## 2022-06-16 DIAGNOSIS — I10 Essential (primary) hypertension: Secondary | ICD-10-CM | POA: Diagnosis not present

## 2022-06-16 DIAGNOSIS — R262 Difficulty in walking, not elsewhere classified: Secondary | ICD-10-CM | POA: Diagnosis not present

## 2022-06-16 DIAGNOSIS — R609 Edema, unspecified: Secondary | ICD-10-CM | POA: Diagnosis not present

## 2022-06-16 DIAGNOSIS — R5381 Other malaise: Secondary | ICD-10-CM | POA: Diagnosis not present

## 2022-06-16 DIAGNOSIS — E785 Hyperlipidemia, unspecified: Secondary | ICD-10-CM | POA: Diagnosis not present

## 2022-06-17 DIAGNOSIS — M6281 Muscle weakness (generalized): Secondary | ICD-10-CM | POA: Diagnosis not present

## 2022-06-17 DIAGNOSIS — N179 Acute kidney failure, unspecified: Secondary | ICD-10-CM | POA: Diagnosis not present

## 2022-06-17 DIAGNOSIS — N139 Obstructive and reflux uropathy, unspecified: Secondary | ICD-10-CM | POA: Diagnosis not present

## 2022-06-17 DIAGNOSIS — R262 Difficulty in walking, not elsewhere classified: Secondary | ICD-10-CM | POA: Diagnosis not present

## 2022-06-18 DIAGNOSIS — F321 Major depressive disorder, single episode, moderate: Secondary | ICD-10-CM | POA: Diagnosis not present

## 2022-06-18 DIAGNOSIS — M6281 Muscle weakness (generalized): Secondary | ICD-10-CM | POA: Diagnosis not present

## 2022-06-18 DIAGNOSIS — R69 Illness, unspecified: Secondary | ICD-10-CM | POA: Diagnosis not present

## 2022-06-18 DIAGNOSIS — E119 Type 2 diabetes mellitus without complications: Secondary | ICD-10-CM | POA: Diagnosis not present

## 2022-06-18 DIAGNOSIS — E785 Hyperlipidemia, unspecified: Secondary | ICD-10-CM | POA: Diagnosis not present

## 2022-06-18 DIAGNOSIS — R262 Difficulty in walking, not elsewhere classified: Secondary | ICD-10-CM | POA: Diagnosis not present

## 2022-06-18 DIAGNOSIS — N139 Obstructive and reflux uropathy, unspecified: Secondary | ICD-10-CM | POA: Diagnosis not present

## 2022-06-18 DIAGNOSIS — N179 Acute kidney failure, unspecified: Secondary | ICD-10-CM | POA: Diagnosis not present

## 2022-06-18 DIAGNOSIS — I10 Essential (primary) hypertension: Secondary | ICD-10-CM | POA: Diagnosis not present

## 2022-06-19 DIAGNOSIS — M6281 Muscle weakness (generalized): Secondary | ICD-10-CM | POA: Diagnosis not present

## 2022-06-19 DIAGNOSIS — N139 Obstructive and reflux uropathy, unspecified: Secondary | ICD-10-CM | POA: Diagnosis not present

## 2022-06-19 DIAGNOSIS — N179 Acute kidney failure, unspecified: Secondary | ICD-10-CM | POA: Diagnosis not present

## 2022-06-19 DIAGNOSIS — R262 Difficulty in walking, not elsewhere classified: Secondary | ICD-10-CM | POA: Diagnosis not present

## 2022-06-20 DIAGNOSIS — N139 Obstructive and reflux uropathy, unspecified: Secondary | ICD-10-CM | POA: Diagnosis not present

## 2022-06-20 DIAGNOSIS — N179 Acute kidney failure, unspecified: Secondary | ICD-10-CM | POA: Diagnosis not present

## 2022-06-20 DIAGNOSIS — M6281 Muscle weakness (generalized): Secondary | ICD-10-CM | POA: Diagnosis not present

## 2022-06-20 DIAGNOSIS — R262 Difficulty in walking, not elsewhere classified: Secondary | ICD-10-CM | POA: Diagnosis not present

## 2022-06-23 DIAGNOSIS — I10 Essential (primary) hypertension: Secondary | ICD-10-CM | POA: Diagnosis not present

## 2022-06-23 DIAGNOSIS — R262 Difficulty in walking, not elsewhere classified: Secondary | ICD-10-CM | POA: Diagnosis not present

## 2022-06-23 DIAGNOSIS — N139 Obstructive and reflux uropathy, unspecified: Secondary | ICD-10-CM | POA: Diagnosis not present

## 2022-06-23 DIAGNOSIS — N179 Acute kidney failure, unspecified: Secondary | ICD-10-CM | POA: Diagnosis not present

## 2022-06-23 DIAGNOSIS — M6281 Muscle weakness (generalized): Secondary | ICD-10-CM | POA: Diagnosis not present

## 2022-06-23 DIAGNOSIS — E1165 Type 2 diabetes mellitus with hyperglycemia: Secondary | ICD-10-CM | POA: Diagnosis not present

## 2022-06-24 DIAGNOSIS — N139 Obstructive and reflux uropathy, unspecified: Secondary | ICD-10-CM | POA: Diagnosis not present

## 2022-06-24 DIAGNOSIS — M6281 Muscle weakness (generalized): Secondary | ICD-10-CM | POA: Diagnosis not present

## 2022-06-24 DIAGNOSIS — R262 Difficulty in walking, not elsewhere classified: Secondary | ICD-10-CM | POA: Diagnosis not present

## 2022-06-24 DIAGNOSIS — N179 Acute kidney failure, unspecified: Secondary | ICD-10-CM | POA: Diagnosis not present

## 2022-06-25 DIAGNOSIS — N179 Acute kidney failure, unspecified: Secondary | ICD-10-CM | POA: Diagnosis not present

## 2022-06-25 DIAGNOSIS — M6281 Muscle weakness (generalized): Secondary | ICD-10-CM | POA: Diagnosis not present

## 2022-06-25 DIAGNOSIS — R69 Illness, unspecified: Secondary | ICD-10-CM | POA: Diagnosis not present

## 2022-06-25 DIAGNOSIS — F321 Major depressive disorder, single episode, moderate: Secondary | ICD-10-CM | POA: Diagnosis not present

## 2022-06-25 DIAGNOSIS — N139 Obstructive and reflux uropathy, unspecified: Secondary | ICD-10-CM | POA: Diagnosis not present

## 2022-06-25 DIAGNOSIS — R262 Difficulty in walking, not elsewhere classified: Secondary | ICD-10-CM | POA: Diagnosis not present

## 2022-06-26 DIAGNOSIS — N179 Acute kidney failure, unspecified: Secondary | ICD-10-CM | POA: Diagnosis not present

## 2022-06-26 DIAGNOSIS — R262 Difficulty in walking, not elsewhere classified: Secondary | ICD-10-CM | POA: Diagnosis not present

## 2022-06-26 DIAGNOSIS — N139 Obstructive and reflux uropathy, unspecified: Secondary | ICD-10-CM | POA: Diagnosis not present

## 2022-06-26 DIAGNOSIS — M6281 Muscle weakness (generalized): Secondary | ICD-10-CM | POA: Diagnosis not present

## 2022-06-30 DIAGNOSIS — N139 Obstructive and reflux uropathy, unspecified: Secondary | ICD-10-CM | POA: Diagnosis not present

## 2022-06-30 DIAGNOSIS — I10 Essential (primary) hypertension: Secondary | ICD-10-CM | POA: Diagnosis not present

## 2022-06-30 DIAGNOSIS — R338 Other retention of urine: Secondary | ICD-10-CM | POA: Diagnosis not present

## 2022-06-30 DIAGNOSIS — R69 Illness, unspecified: Secondary | ICD-10-CM | POA: Diagnosis not present

## 2022-06-30 DIAGNOSIS — E119 Type 2 diabetes mellitus without complications: Secondary | ICD-10-CM | POA: Diagnosis not present

## 2022-06-30 DIAGNOSIS — R5381 Other malaise: Secondary | ICD-10-CM | POA: Diagnosis not present

## 2022-06-30 DIAGNOSIS — E785 Hyperlipidemia, unspecified: Secondary | ICD-10-CM | POA: Diagnosis not present

## 2022-06-30 DIAGNOSIS — C61 Malignant neoplasm of prostate: Secondary | ICD-10-CM | POA: Diagnosis not present

## 2022-07-03 DIAGNOSIS — F321 Major depressive disorder, single episode, moderate: Secondary | ICD-10-CM | POA: Diagnosis not present

## 2022-07-22 ENCOUNTER — Ambulatory Visit: Payer: Medicare HMO | Admitting: Family Medicine

## 2022-07-24 ENCOUNTER — Emergency Department (HOSPITAL_COMMUNITY)
Admission: EM | Admit: 2022-07-24 | Discharge: 2022-07-24 | Disposition: A | Discharge status: Home / Self Care | Payer: Medicare HMO | Attending: Emergency Medicine | Admitting: Emergency Medicine

## 2022-07-24 ENCOUNTER — Other Ambulatory Visit: Payer: Self-pay

## 2022-07-24 ENCOUNTER — Encounter (HOSPITAL_COMMUNITY): Payer: Self-pay

## 2022-07-24 ENCOUNTER — Emergency Department (HOSPITAL_COMMUNITY): Payer: Medicare HMO

## 2022-07-24 DIAGNOSIS — R531 Weakness: Secondary | ICD-10-CM | POA: Diagnosis not present

## 2022-07-24 DIAGNOSIS — Z20822 Contact with and (suspected) exposure to covid-19: Secondary | ICD-10-CM | POA: Insufficient documentation

## 2022-07-24 DIAGNOSIS — W06XXXA Fall from bed, initial encounter: Secondary | ICD-10-CM | POA: Diagnosis not present

## 2022-07-24 DIAGNOSIS — B9689 Other specified bacterial agents as the cause of diseases classified elsewhere: Secondary | ICD-10-CM | POA: Insufficient documentation

## 2022-07-24 DIAGNOSIS — E119 Type 2 diabetes mellitus without complications: Secondary | ICD-10-CM | POA: Diagnosis not present

## 2022-07-24 DIAGNOSIS — R4182 Altered mental status, unspecified: Secondary | ICD-10-CM | POA: Diagnosis not present

## 2022-07-24 DIAGNOSIS — M25571 Pain in right ankle and joints of right foot: Secondary | ICD-10-CM | POA: Diagnosis not present

## 2022-07-24 DIAGNOSIS — R739 Hyperglycemia, unspecified: Secondary | ICD-10-CM | POA: Diagnosis not present

## 2022-07-24 DIAGNOSIS — I10 Essential (primary) hypertension: Secondary | ICD-10-CM | POA: Diagnosis not present

## 2022-07-24 DIAGNOSIS — Z794 Long term (current) use of insulin: Secondary | ICD-10-CM | POA: Insufficient documentation

## 2022-07-24 DIAGNOSIS — Z79899 Other long term (current) drug therapy: Secondary | ICD-10-CM | POA: Insufficient documentation

## 2022-07-24 DIAGNOSIS — W19XXXA Unspecified fall, initial encounter: Secondary | ICD-10-CM | POA: Diagnosis not present

## 2022-07-24 DIAGNOSIS — Z7984 Long term (current) use of oral hypoglycemic drugs: Secondary | ICD-10-CM | POA: Diagnosis not present

## 2022-07-24 DIAGNOSIS — N390 Urinary tract infection, site not specified: Secondary | ICD-10-CM | POA: Insufficient documentation

## 2022-07-24 DIAGNOSIS — Z743 Need for continuous supervision: Secondary | ICD-10-CM | POA: Diagnosis not present

## 2022-07-24 LAB — URINALYSIS, ROUTINE W REFLEX MICROSCOPIC
Bilirubin Urine: NEGATIVE
Glucose, UA: >=500 mg/dL — AB
Hgb urine dipstick: NEGATIVE
Ketones, ur: NEGATIVE mg/dL
Nitrite: NEGATIVE
Protein, ur: 100 mg/dL — AB
Specific Gravity, Urine: 1.011 (ref 1.005–1.030)
WBC, UA: >50 WBC/hpf (ref 0–5)
pH: 7 (ref 5.0–8.0)

## 2022-07-24 LAB — CBC WITH DIFFERENTIAL/PLATELET
Abs Immature Granulocytes: 0.04 10*3/uL (ref 0.00–0.07)
Basophils Absolute: 0.1 10*3/uL (ref 0.0–0.1)
Basophils Relative: 1 %
Eosinophils Absolute: 0.2 10*3/uL (ref 0.0–0.5)
Eosinophils Relative: 2 %
HCT: 38.9 % — ABNORMAL LOW (ref 39.0–52.0)
Hemoglobin: 11.9 g/dL — ABNORMAL LOW (ref 13.0–17.0)
Immature Granulocytes: 0 %
Lymphocytes Relative: 19 %
Lymphs Abs: 2 10*3/uL (ref 0.7–4.0)
MCH: 28 pg (ref 26.0–34.0)
MCHC: 30.6 g/dL (ref 30.0–36.0)
MCV: 91.5 fL (ref 80.0–100.0)
Monocytes Absolute: 0.7 10*3/uL (ref 0.1–1.0)
Monocytes Relative: 7 %
Neutro Abs: 7.3 10*3/uL (ref 1.7–7.7)
Neutrophils Relative %: 71 %
Platelets: 125 10*3/uL — ABNORMAL LOW (ref 150–400)
RBC: 4.25 MIL/uL (ref 4.22–5.81)
RDW: 13.8 % (ref 11.5–15.5)
WBC: 10.1 10*3/uL (ref 4.0–10.5)
nRBC: 0 % (ref 0.0–0.2)

## 2022-07-24 LAB — BASIC METABOLIC PANEL
Anion gap: 6 (ref 5–15)
BUN: 21 mg/dL (ref 8–23)
CO2: 27 mmol/L (ref 22–32)
Calcium: 8.6 mg/dL — ABNORMAL LOW (ref 8.9–10.3)
Chloride: 101 mmol/L (ref 98–111)
Creatinine, Ser: 1.19 mg/dL (ref 0.61–1.24)
GFR, Estimated: >60 mL/min (ref >60)
Glucose, Bld: 356 mg/dL — ABNORMAL HIGH (ref 70–99)
Potassium: 3.7 mmol/L (ref 3.5–5.1)
Sodium: 134 mmol/L — ABNORMAL LOW (ref 135–145)

## 2022-07-24 LAB — RAPID URINE DRUG SCREEN, HOSP PERFORMED
Amphetamines: NOT DETECTED
Barbiturates: NOT DETECTED
Benzodiazepines: NOT DETECTED
Cocaine: NOT DETECTED
Opiates: NOT DETECTED
Tetrahydrocannabinol: NOT DETECTED

## 2022-07-24 LAB — CBG MONITORING, ED: Glucose-Capillary: 281 mg/dL — ABNORMAL HIGH (ref 70–99)

## 2022-07-24 LAB — RESP PANEL BY RT-PCR (RSV, FLU A&B, COVID)  RVPGX2
Influenza A by PCR: NEGATIVE
Influenza B by PCR: NEGATIVE
Resp Syncytial Virus by PCR: NEGATIVE
SARS Coronavirus 2 by RT PCR: NEGATIVE

## 2022-07-24 LAB — AMMONIA: Ammonia: 11 umol/L (ref 9–35)

## 2022-07-24 LAB — MAGNESIUM: Magnesium: 1.9 mg/dL (ref 1.7–2.4)

## 2022-07-24 LAB — ETHANOL: Alcohol, Ethyl (B): <10 mg/dL (ref <10)

## 2022-07-24 LAB — TROPONIN I (HIGH SENSITIVITY): Troponin I (High Sensitivity): 12 ng/L (ref <18)

## 2022-07-24 MED ORDER — AMLODIPINE BESYLATE 5 MG PO TABS
5.0000 mg | ORAL_TABLET | Freq: Once | ORAL | Status: AC
  Administered 2022-07-24: 5 mg via ORAL
  Filled 2022-07-24: qty 1

## 2022-07-24 MED ORDER — CARVEDILOL 12.5 MG PO TABS
25.0000 mg | ORAL_TABLET | Freq: Two times a day (BID) | ORAL | Status: DC
  Administered 2022-07-24: 25 mg via ORAL
  Filled 2022-07-24: qty 2

## 2022-07-24 MED ORDER — CEPHALEXIN 500 MG PO CAPS: 500.0000 mg | ORAL_CAPSULE | Freq: Three times a day (TID) | ORAL | 0 refills | Status: AC

## 2022-07-24 MED ORDER — INSULIN ASPART 100 UNIT/ML IJ SOLN
5.0000 [IU] | Freq: Once | INTRAMUSCULAR | Status: AC
  Administered 2022-07-24: 5 [IU] via SUBCUTANEOUS

## 2022-07-24 MED ORDER — METFORMIN HCL 500 MG PO TABS
1000.0000 mg | ORAL_TABLET | Freq: Once | ORAL | Status: AC
  Administered 2022-07-24: 1000 mg via ORAL
  Filled 2022-07-24: qty 2

## 2022-07-24 MED ORDER — CEPHALEXIN 250 MG PO CAPS
500.0000 mg | ORAL_CAPSULE | Freq: Once | ORAL | Status: AC
  Administered 2022-07-24: 500 mg via ORAL
  Filled 2022-07-24: qty 2

## 2022-07-24 NOTE — ED Triage Notes (Signed)
Biba from home, progressive weakness, fallsx2 today.  Denies injury. Slid to floor from edge of bed, landing on coccyx, -LOC, +plavix.  Indwelling foley, incontinent of stool upon arrival

## 2022-07-24 NOTE — ED Notes (Signed)
To CT

## 2022-07-24 NOTE — ED Notes (Signed)
CSW at bedside.

## 2022-07-24 NOTE — Progress Notes (Addendum)
TOC CSW spoke with pts daughter, Daronte Thunstrom [phone].  [name] stated pt was recently in a SNF for rehab.  Pt was at [facility] for 6 months.  Pt was set up with [facility] prior to dc from [facility].  [name] is on her way here to visit or pick up pt.  [name] asked that CSW and CM speak with pt about permanent placement/LTC at a facility.   Rashied Corallo Tarpley-Carter, MSW, LCSW-A Pronouns:  She/Her/Hers [facility] HealthTransitions of Care Clinical Social Worker Direct Number:  [phone] [email or web address]

## 2022-07-24 NOTE — ED Provider Notes (Signed)
[facility] Provider Note   CSN: [id number] Arrival date & time: 07/24/22  1535     History  Chief Complaint  Patient presents with   Fall   Weakness    [name] is a 72 y.o. male with history of type 2 diabetes, chronic indwelling Foley, hypertension, presenting from home with generalized weakness.  Patient called EMS because his legs gave out and felt weak today and he fell onto his buttocks from the bed.  Denies striking his head.  Denies loss of consciousness.  Typically walks with a walker.  He says he lives independently but family comes to help him sometimes.  He feels that he has been a gradual decline for his strength for some time.  He is on Plavix.  Patient reports his Foley was replaced about 4 weeks ago and he is due to have it replaced.  HPI     Home Medications Prior to Admission medications   Medication Sig Start Date End Date Taking? Authorizing Provider  cephALEXin (KEFLEX) 500 MG capsule Take 1 capsule (500 mg total) by mouth 3 (three) times daily for 7 days. 07/25/22 08/01/22 Yes Nyeemah Jennette, Carola Rhine, MD  amLODipine (NORVASC) 5 MG tablet TAKE 1 TABLET BY MOUTH EVERYDAY AT BEDTIME Patient taking differently: Take 5 mg by mouth daily. 09/03/21   Ganta, Anupa, DO  atorvastatin (LIPITOR) 40 MG tablet Take 1 tablet (40 mg total) by mouth daily. 12/18/20   Donney Dice, DO  Blood Gluc Meter Disp-Strips (SIDEKICK BLOOD GLUCOSE SYSTEM) DEVI Use for daily testing Patient not taking: Reported on 01/22/2022 08/06/16   Lauree Chandler, NP  Blood Pressure Monitor DEVI 1 Units by Does not apply route daily. Check  blood pressure once daily, record readings, bring to doctor's office. Patient not taking: Reported on 01/22/2022 11/27/20   Daisy Floro, DO  Capsicum, Cayenne, (CAYENNE PEPPER PO) Take 1 tablet by mouth at bedtime.    [provider]  carvedilol (COREG) 25 MG tablet TAKE 1 TABLET BY MOUTH TWICE A DAY WITH  MEALS 12/04/21   Ganta, Anupa, DO  clopidogrel (PLAVIX) 75 MG tablet Take 1 tablet (75 mg total) by mouth daily. 07/08/21   Ganta, Anupa, DO  doxycycline (VIBRA-TABS) 100 MG tablet Take 1 tablet (100 mg total) by mouth 2 (two) times daily. 03/05/22   Gardiner Barefoot, DPM  empagliflozin (JARDIANCE) 10 MG TABS tablet Take 1 tablet (10 mg total) by mouth daily. 11/26/21   Alcus Dad, MD  Insulin Pen Needle (B-D ULTRAFINE III SHORT PEN) 31G X 8 MM MISC USE WITH LEVEMIR 08/06/16   Lauree Chandler, NP  metFORMIN (GLUCOPHAGE) 1000 MG tablet TAKE 1 TABLET (1,000 MG TOTAL) BY MOUTH 2 (TWO) TIMES DAILY WITH A MEAL. 06/13/20   Daisy Floro, DO  polyethylene glycol (MIRALAX / GLYCOLAX) 17 g packet Take 17 g by mouth daily as needed. 11/25/21   Alcus Dad, MD  prednisoLONE acetate (PRED FORTE) 1 % ophthalmic suspension Place 1 drop into the right eye 4 (four) times daily. 01/01/22   [provider]  senna (SENOKOT) 8.6 MG TABS tablet Take 1 tablet (8.6 mg total) by mouth daily as needed for mild constipation. 11/25/21   Alcus Dad, MD  sertraline (ZOLOFT) 100 MG tablet TAKE 1 TABLET BY MOUTH EVERY DAY 12/26/21   Ganta, Anupa, DO  tamsulosin (FLOMAX) 0.4 MG CAPS capsule Take 2 capsules (0.8 mg total) by mouth daily. 11/26/21   Alcus Dad,  MD      Allergies    Pork allergy and Shrimp flavor    Review of Systems   Review of Systems  Physical Exam Updated Vital Signs BP (!) 201/129   Pulse 69   Temp 98.7 F (37.1 C) (Oral)   Resp (!) 21   Ht 5' 7"$  (1.702 m)   Wt 90.7 kg   SpO2 100%   BMI 31.32 kg/m  Physical Exam Constitutional:      General: He is not in acute distress.    Comments: Speech slow and difficult to understand  HENT:     Head: Normocephalic and atraumatic.  Eyes:     Conjunctiva/sclera: Conjunctivae normal.     Pupils: Pupils are equal, round, and reactive to light.  Cardiovascular:     Rate and Rhythm: Normal rate and regular rhythm.  Pulmonary:      Effort: Pulmonary effort is normal. No respiratory distress.  Abdominal:     General: There is no distension.     Tenderness: There is no abdominal tenderness.  Genitourinary:    Comments: Indwelling Foley catheter. Skin:    General: Skin is warm and dry.  Neurological:     General: No focal deficit present.     Mental Status: He is alert. Mental status is at baseline.     Comments: Generalized weakness of the arms and legs, no focal weakness of the right or left side.  Sensory dermatomes are intact.  Questionable right-sided facial droop     ED Results / Procedures / Treatments   Labs (all labs ordered are listed, but only abnormal results are displayed) Labs Reviewed  CBC WITH DIFFERENTIAL/PLATELET - Abnormal; Notable for the following components:      Result Value   Hemoglobin 11.9 (*)    HCT 38.9 (*)    Platelets 125 (*)    All other components within normal limits  BASIC METABOLIC PANEL - Abnormal; Notable for the following components:   Sodium 134 (*)    Glucose, Bld 356 (*)    Calcium 8.6 (*)    All other components within normal limits  URINALYSIS, ROUTINE W REFLEX MICROSCOPIC - Abnormal; Notable for the following components:   APPearance CLOUDY (*)    Glucose, UA >=500 (*)    Protein, ur 100 (*)    Leukocytes,Ua LARGE (*)    Bacteria, UA RARE (*)    All other components within normal limits  CBG MONITORING, ED - Abnormal; Notable for the following components:   Glucose-Capillary 281 (*)    All other components within normal limits  RESP PANEL BY RT-PCR (RSV, FLU A&B, COVID)  [id number]  URINE CULTURE  MAGNESIUM  ETHANOL  RAPID URINE DRUG SCREEN, HOSP PERFORMED  AMMONIA  TROPONIN I (HIGH SENSITIVITY)    EKG EKG Interpretation  Date/Time:  Thursday July 24 2022 17:06:26 EST Ventricular Rate:  74 PR Interval:  169 QRS Duration: 72 QT Interval:  415 QTC Calculation: 461 R Axis:   27 Text Interpretation: Sinus rhythm Probable left atrial enlargement  Probable anteroseptal infarct, old Confirmed by Octaviano Glow [phone] on 07/24/2022 6:05:27 PM  Radiology CT Head Wo Contrast  Result Date: 07/24/2022 CLINICAL DATA:  Mental status change, unknown cause EXAM: CT HEAD WITHOUT CONTRAST TECHNIQUE: Contiguous axial images were obtained from the base of the skull through the vertex without intravenous contrast. RADIATION DOSE REDUCTION: This exam was performed according to the departmental dose-optimization program which includes automated exposure control, adjustment of the mA and/or kV  according to patient size and/or use of iterative reconstruction technique. COMPARISON:  11/15/2021 FINDINGS: Brain: There is atrophy and chronic small vessel disease changes. No acute intracranial abnormality. Specifically, no hemorrhage, hydrocephalus, mass lesion, acute infarction, or significant intracranial injury. Vascular: No hyperdense vessel or unexpected calcification. Skull: No acute calvarial abnormality. Sinuses/Orbits: Mucosal thickening throughout the paranasal sinuses. No air-fluid levels. Other: None IMPRESSION: Atrophy, chronic microvascular disease. No acute intracranial abnormality. Chronic sinusitis Electronically Signed   By: Rolm Baptise M.D.   On: 07/24/2022 17:07    Procedures Procedures    Medications Ordered in ED Medications  carvedilol (COREG) tablet 25 mg (25 mg Oral Given 07/24/22 2119)  cephALEXin (KEFLEX) capsule 500 mg (500 mg Oral Given 07/24/22 1951)  amLODipine (NORVASC) tablet 5 mg (5 mg Oral Given 07/24/22 2119)  metFORMIN (GLUCOPHAGE) tablet 1,000 mg (1,000 mg Oral Given 07/24/22 2119)  insulin aspart (novoLOG) injection 5 Units (5 Units Subcutaneous Given 07/24/22 2126)    ED Course/ Medical Decision Making/ A&P                             Medical Decision Making Amount and/or Complexity of Data Reviewed Labs: ordered. Radiology: ordered. ECG/medicine tests: ordered.  Risk Prescription drug management.   This patient  presents to the Emergency Department with complaint of altered mental status.  This involves an extensive number of treatment options, and is a complaint that carries with it a high risk of complications and morbidity.  The differential diagnosis includes hypoglycemia vs metabolic encephalopathy vs infection (including cystitis) vs ICH vs stroke vs polypharmacy vs other  I ordered, reviewed, and interpreted labs, including UA which has potential evidence of infection, unclear, as this may be contaminated.  No other emergent findings on blood work. I ordered medication cephalexin for possible male UTI, insulin for hyperglycemia, patient's blood pressure medication for the evening. I ordered imaging studies which included CT scan of the head I independently visualized and interpreted imaging which showed no acute abnormalities and the monitor tracing which showed normal sinus rhythm Additional history was obtained from EMS Previous records obtained and reviewed showing echo from June 2023 with EF 70-75%, severe left atrial dilation  I personally reviewed the patients ECG which showed sinus rhythm with no acute ischemic findings  After the interventions stated above, I reevaluated the patient and found that he was able to ambulate with his walker in the ED.  He was complaining of some right anterior ankle pain, which is negative for the [place] ankle criteria, have a low suspicion for fracture.  I suspect this may be a mild ankle sprain.  The patient requested an cam boot orthopedic boot which was provided.  He was able to take bases with the boot.  His son was present at the bedside regarding plan for discharge home.  Social worker was consulted to see if we can assist with further home care.  The patient is wanting to go home.  I have a low suspicion at this time for stroke, PE, ACS, sepsis, or other life-threatening emergency, including hypertensive crisis.         Final Clinical Impression(s) /  ED Diagnoses Final diagnoses:  Urinary tract infection without hematuria, site unspecified  Weakness  Acute right ankle pain  Hypertension, unspecified type    Rx / DC Orders ED Discharge Orders          Ordered    cephALEXin (KEFLEX) 500 MG capsule  3 times daily        07/24/22 2103              Wyvonnia Dusky, MD 07/24/22 2217

## 2022-07-24 NOTE — ED Notes (Signed)
RN assisted patient to stand with the walker and take a couple steps forward and back. Patient did well standing still, he had more issues with  taking small steps.

## 2022-07-24 NOTE — Discharge Instructions (Addendum)
Your workup showed that you may possibly have a urine infection.  We change your catheter with a clean one.  We started you on an antibiotic which you will take for the next 7 days, with your next dose due tomorrow morning.  Please have your family pick up the medication from the pharmacy.  We did not see any other medical emergencies on your workup today.  For your general weakness, I have placed a consult to our social worker, who should reach out to your family to see if you are eligible for home health.  Please take your time when standing up at home.  Continue drinking plenty of water at home.  Call your doctors office to schedule follow-up appointment.  Please note it is possible you have an ankle sprain.  The boot is for your comfort only.  You do not need to wear it at all times.  You can take it off when you are in bed or on the couch.  But it may give you stability over the next 7 days when walking.  *  Finally, schedule follow-up visit with your doctor about your high blood pressure and your diabetes.  It is very important to take all of your medicines as prescribed.

## 2022-07-27 DIAGNOSIS — E1151 Type 2 diabetes mellitus with diabetic peripheral angiopathy without gangrene: Secondary | ICD-10-CM | POA: Diagnosis not present

## 2022-07-27 DIAGNOSIS — I10 Essential (primary) hypertension: Secondary | ICD-10-CM | POA: Diagnosis not present

## 2022-07-27 DIAGNOSIS — F32A Depression, unspecified: Secondary | ICD-10-CM | POA: Diagnosis not present

## 2022-07-27 DIAGNOSIS — Z86718 Personal history of other venous thrombosis and embolism: Secondary | ICD-10-CM | POA: Diagnosis not present

## 2022-07-27 DIAGNOSIS — Z466 Encounter for fitting and adjustment of urinary device: Secondary | ICD-10-CM | POA: Diagnosis not present

## 2022-07-27 DIAGNOSIS — N400 Enlarged prostate without lower urinary tract symptoms: Secondary | ICD-10-CM | POA: Diagnosis not present

## 2022-07-27 DIAGNOSIS — N179 Acute kidney failure, unspecified: Secondary | ICD-10-CM | POA: Diagnosis not present

## 2022-07-27 DIAGNOSIS — I69351 Hemiplegia and hemiparesis following cerebral infarction affecting right dominant side: Secondary | ICD-10-CM | POA: Diagnosis not present

## 2022-07-27 DIAGNOSIS — N139 Obstructive and reflux uropathy, unspecified: Secondary | ICD-10-CM | POA: Diagnosis not present

## 2022-07-27 DIAGNOSIS — E785 Hyperlipidemia, unspecified: Secondary | ICD-10-CM | POA: Diagnosis not present

## 2022-07-28 ENCOUNTER — Telehealth (HOSPITAL_BASED_OUTPATIENT_CLINIC_OR_DEPARTMENT_OTHER): Payer: Self-pay

## 2022-07-28 ENCOUNTER — Telehealth: Payer: Self-pay

## 2022-07-28 LAB — URINE CULTURE: Culture: 80000 — AB

## 2022-07-28 NOTE — Telephone Encounter (Signed)
[facility] RN calls nurse line requesting verbal orders for home health services as follows.   [facility] [place] PT  [facility] OT Medical Social Worker  All verbals given.

## 2022-07-28 NOTE — Progress Notes (Signed)
ED Antimicrobial Stewardship Positive Culture Follow Up   [name] is an 72 y.o. male who presented to [facility] on 07/24/2022 with a chief complaint of  Chief Complaint  Patient presents with   Fall   Weakness    Recent Results (from the past 720 hour(s))  Remove and replace urinary cath (placed > 5 days) then obtain urine culture from new indwelling urinary catheter.     Status: Abnormal   Collection Time: 07/24/22  4:09 PM   Specimen: Urine, Catheterized  Result Value Ref Range Status   Specimen Description URINE, CATHETERIZED  Final   Special Requests NONE  Final   Culture (A)  Final    80,000 COLONIES/mL SERRATIA MARCESCENS 80,000 COLONIES/mL ENTEROCOCCUS FAECALIS    Report Status 07/27/2022 FINAL  Final   Organism ID, Bacteria SERRATIA MARCESCENS (A)  Final   Organism ID, Bacteria ENTEROCOCCUS FAECALIS (A)  Final      Susceptibility   Enterococcus faecalis - MIC*    AMPICILLIN <=2 SENSITIVE Sensitive     LEVOFLOXACIN 2 SENSITIVE Sensitive     NITROFURANTOIN <=16 SENSITIVE Sensitive     VANCOMYCIN Value in next row Sensitive      1 SENSITIVEPerformed at [facility] [address], [place], [place] [address]    * 80,000 COLONIES/mL ENTEROCOCCUS FAECALIS   Serratia marcescens - MIC*    CEFEPIME Value in next row Sensitive      1 SENSITIVEPerformed at [facility] [address] [place], [place] [address]    CEFTRIAXONE Value in next row Sensitive      1 SENSITIVEPerformed at [facility] [address] [place], [place] [address]    CIPROFLOXACIN Value in next row Sensitive      1 SENSITIVEPerformed at [facility] [address] [place], [place] [address]    GENTAMICIN Value in next row Sensitive      1 SENSITIVEPerformed at [facility] [address], [place], [place] [address]    NITROFURANTOIN Value in next row Resistant      1 SENSITIVEPerformed at [facility] [address], [place], [place] [address]     TRIMETH/SULFA Value in next row Sensitive      1 SENSITIVEPerformed at [facility] [address] [place], [place] [address]    * 80,000 COLONIES/mL SERRATIA MARCESCENS  Resp panel by RT-PCR (RSV, Flu A&B, Covid) Anterior Nasal Swab     Status: None   Collection Time: 07/24/22  4:09 PM   Specimen: Anterior Nasal Swab  Result Value Ref Range Status   SARS Coronavirus 2 by RT PCR NEGATIVE NEGATIVE Final   Influenza A by PCR NEGATIVE NEGATIVE Final   Influenza B by PCR NEGATIVE NEGATIVE Final    Comment: (NOTE) The Xpert Xpress SARS-CoV-2/FLU/RSV plus assay is intended as an aid in the diagnosis of influenza from Nasopharyngeal swab specimens and should not be used as a sole basis for treatment. Nasal washings and aspirates are unacceptable for Xpert Xpress SARS-CoV-2/FLU/RSV testing.  Fact Sheet for Patients: EntrepreneurPulse.com.au  Fact Sheet for Healthcare Providers: IncredibleEmployment.be  This test is not yet approved or cleared by the [place] FDA and has been authorized for detection and/or diagnosis of SARS-CoV-2 by FDA under an Emergency Use Authorization (EUA). This EUA will remain in effect (meaning this test can be used) for the duration of the COVID-19 declaration under Section 564(b)(1) of the Act, 21 U.S.C. section 360bbb-3(b)(1),  unless the authorization is terminated or revoked.     Resp Syncytial Virus by PCR NEGATIVE NEGATIVE Final    Comment: (NOTE) Fact Sheet for Patients: EntrepreneurPulse.com.au  Fact Sheet for Healthcare Providers: IncredibleEmployment.be  This test is not yet approved or cleared by the [place] FDA and has been authorized for detection and/or diagnosis of SARS-CoV-2 by FDA under an Emergency Use Authorization (EUA). This EUA will remain in effect (meaning this test can be used) for the duration of the COVID-19 declaration under Section  564(b)(1) of the Act, 21 U.S.C. section 360bbb-3(b)(1), unless the authorization is terminated or revoked.  Performed at [facility], [place] [address], [place], [place] [address]    Patient has chronic indwelling foley. Was afebrile. WBC >50, rare bacteria, 0-5 squamous cells on UA. Patient did report ankle pain. No urinary symptoms reported. Diagnosis: UTI, weakness, acute R ankle pain, hypertension.   '[x]'$  Treated with cephalexin, organism resistant to prescribed antimicrobial '[]'$  Patient discharged originally without antimicrobial agent and treatment is now indicated  Plan:  STOP cephalexin.  New antibiotic prescription: If still having symptoms of UTI, start levofloxacin 250 mg PO daily x 3 days.  ED Provider: Carlisle Cater, PA-C   Eliseo Gum, PharmD PGY1 Pharmacy Resident   07/28/2022  8:11 AM   Clinical Pharmacist Monday - Friday phone -  [phone] Saturday - Sunday phone - [phone]

## 2022-07-28 NOTE — Telephone Encounter (Signed)
Post ED Visit - Positive Culture Follow-up: Unsuccessful Patient Follow-up  Culture assessed and recommendations reviewed by:  '[x]'$  Eliseo Gum, Pharm.D. '[]'$  Heide Guile, Pharm.D., BCPS AQ-ID '[]'$  Parks Neptune, Pharm.D., BCPS '[]'$  Alycia Rossetti, Pharm.D., BCPS '[]'$  [place], [place].D., BCPS, AAHIVP '[]'$  Legrand Como, Pharm.D., BCPS, AAHIVP '[]'$  Wynell Balloon, PharmD '[]'$  Vincenza Hews, PharmD, BCPS  Positive urine culture  '[]'$  Patient discharged without antimicrobial prescription and treatment is now indicated '[x]'$  Organism is resistant to prescribed ED discharge antimicrobial '[]'$  Patient with positive blood cultures  Plan: Stop Cephalexin, if still having symptoms, start Levofloxacin 250 mo po daily x 3 days per ED provider Carlisle Cater, PA-C  Unable to contact patient after 3 attempts, letter will be sent to address on file  Glennon Hamilton 07/28/2022, 9:04 AM

## 2022-07-29 ENCOUNTER — Telehealth: Payer: Self-pay

## 2022-07-29 NOTE — Transitions of Care (Post Inpatient/ED Visit) (Signed)
   07/29/2022  Name: [name] MRN: [id number] DOB: 12/18/1950  Today's TOC FU Call Status: Today's TOC FU Call Status:: Unsuccessul Call (1st Attempt) Unsuccessful Call (1st Attempt) Date:  (Red on EMMI-ED Discharge Alert Date & Reason:07/26/22-"Scheduled follow-up appt? No")  Attempted to reach the patient regarding the most recent Inpatient/ED visit.  Follow Up Plan: Additional outreach attempts will be made to reach the patient to complete the Transitions of Care (Post Inpatient/ED visit) call.     Enzo Montgomery, RN,BSN,CCM [facility] Care Management Care Management Community Coordinator Direct Phone: [phone] Toll Free: [phone] Fax: [phone]

## 2022-07-30 ENCOUNTER — Telehealth: Payer: Self-pay

## 2022-07-30 NOTE — Transitions of Care (Post Inpatient/ED Visit) (Signed)
   07/30/2022  Name: [name] MRN: [id number] DOB: [birth year]  Today's TOC FU Call Status: Today's TOC FU Call Status:: Unsuccessful Call (2nd Attempt) Unsuccessful Call (2nd Attempt) Date: 07/30/22 (Red on EMMI-ED Discharge Alert Date & Reason: 07/26/22-"Scheduled follow-up appt? No")  Attempted to reach the patient regarding the most recent Inpatient/ED visit.  Follow Up Plan: Additional outreach attempts will be made to reach the patient to complete the Transitions of Care (Post Inpatient/ED visit) call.     Enzo Montgomery, RN,BSN,CCM [facility] Care Management Care Management Community Coordinator Direct Phone: [phone] Toll Free: [phone] Fax: [phone]'

## 2022-07-31 ENCOUNTER — Telehealth: Payer: Self-pay

## 2022-07-31 NOTE — Transitions of Care (Post Inpatient/ED Visit) (Signed)
   07/31/2022  Name: DULCE WOJTAS MRN: [id number] DOB: 1950/12/03  Today's TOC FU Call Status: Today's TOC FU Call Status:: Unsuccessful Call (3rd Attempt) Unsuccessful Call (2nd Attempt) Date:  (Red on EMMI-ED Discharge Alert Date & Reason:07/26/22-"Scheduled follow-up appt? No") Unsuccessful Call (3rd Attempt) Date: 07/31/22  Attempted to reach the patient regarding the most recent Inpatient/ED visit.  Follow Up Plan: No further outreach attempts will be made at this time. We have been unable to contact the patient.   Enzo Montgomery, RN,BSN,CCM [facility] Care Management Care Management Community Coordinator Direct Phone: [phone] Toll Free: [phone] Fax: [phone]

## 2022-08-01 ENCOUNTER — Ambulatory Visit (INDEPENDENT_AMBULATORY_CARE_PROVIDER_SITE_OTHER): Payer: Medicare HMO | Admitting: Family Medicine

## 2022-08-01 VITALS — BP 165/82 | HR 86

## 2022-08-01 DIAGNOSIS — R296 Repeated falls: Secondary | ICD-10-CM | POA: Diagnosis not present

## 2022-08-01 DIAGNOSIS — I63522 Cerebral infarction due to unspecified occlusion or stenosis of left anterior cerebral artery: Secondary | ICD-10-CM | POA: Diagnosis not present

## 2022-08-01 DIAGNOSIS — R338 Other retention of urine: Secondary | ICD-10-CM | POA: Diagnosis not present

## 2022-08-01 DIAGNOSIS — C61 Malignant neoplasm of prostate: Secondary | ICD-10-CM | POA: Diagnosis not present

## 2022-08-01 NOTE — Progress Notes (Unsigned)
    SUBJECTIVE:   CHIEF COMPLAINT / HPI:   Hospital Follow-Up Was seen in the ED on 07/24/22 for generalized weakness. He was treated with Keflex for UTI. Discharged home.  Was at [facility] June until January. Family decided to take him home. He lives alone. Will have home health a few times per week. Son and daughter in the area.   Has chronic indwelling foley due to prostate cancer/prostatomegaly  [place] this morning- called EMS, BP was elevated. Did not hit head. Denies complaints.   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  ***  ASSESSMENT/PLAN:   No problem-specific Assessment & Plan notes found for this encounter.     Alcus Dad, MD [place]

## 2022-08-01 NOTE — Patient Instructions (Addendum)
It was great to see you!  I am worried about your overall safety at home.   You are at high risk of Chapman and since you take a blood thinner (Plavix), you are at risk of bleeding in your brain if you hit your head with one of your Chapman.  I recommend a long term facility or living with family. If you are not interested in these options, we will do our best to maximize home health services.  In addition to home health PT/OT, I have placed a referral to palliative care. They specialize in helping patients with multiple, serious medical problems. They may be able to offer you additional resources.  If at any point you decide you're interested in a nursing facility again, please go to the Emergency Department.  Please see Dr Larae Grooms (your PCP) in ~2-3 weeks to follow up.  -Dr Rock Nephew  Fall Prevention in the Home, Adult Chapman can cause injuries and can happen to people of all ages. There are many things you can do to make your home safer and to help prevent Chapman. What actions can I take to prevent Chapman? General information Use good lighting in all rooms. Make sure to: Replace any light bulbs that burn out. Turn on the lights in dark areas and use night-lights. Keep items that you use often in easy-to-reach places. Lower the shelves around your home if needed. Move furniture so that there are clear paths around it. Do not use throw rugs or other things on the floor that can make you trip. If any of your floors are uneven, fix them. Add color or contrast paint or tape to clearly mark and help you see: Grab bars or handrails. First and last steps of staircases. Where the edge of each step is. If you use a ladder or stepladder: Make sure that it is fully opened. Do not climb a closed ladder. Make sure the sides of the ladder are locked in place. Have someone hold the ladder while you use it. Know where your pets are as you move through your home. What can I do in the bathroom?     Keep  the floor dry. Clean up any water on the floor right away. Remove soap buildup in the bathtub or shower. Buildup makes bathtubs and showers slippery. Use non-skid mats or decals on the floor of the bathtub or shower. Attach bath mats securely with double-sided, non-slip rug tape. If you need to sit down in the shower, use a non-slip stool. Install grab bars by the toilet and in the bathtub and shower. Do not use towel bars as grab bars. What can I do in the bedroom? Make sure that you have a light by your bed that is easy to reach. Do not use any sheets or blankets on your bed that hang to the floor. Have a firm chair or bench with side arms that you can use for support when you get dressed. What can I do in the kitchen? Clean up any spills right away. If you need to reach something above you, use a step stool with a grab bar. Keep electrical cords out of the way. Do not use floor polish or wax that makes floors slippery. What can I do with my stairs? Do not leave anything on the stairs. Make sure that you have a light switch at the top and the bottom of the stairs. Make sure that there are handrails on both sides of the stairs. Fix handrails that  are broken or loose. Install non-slip stair treads on all your stairs if they do not have carpet. Avoid having throw rugs at the top or bottom of the stairs. Choose a carpet that does not hide the edge of the steps on the stairs. Make sure that the carpet is firmly attached to the stairs. Fix carpet that is loose or worn. What can I do on the outside of my home? Use bright outdoor lighting. Fix the edges of walkways and driveways and fix any cracks. Clear paths of anything that can make you trip, such as tools or rocks. Add color or contrast paint or tape to clearly mark and help you see anything that might make you trip as you walk through a door, such as a raised step or threshold. Trim any bushes or trees on paths to your home. Check to see if  handrails are loose or broken and that both sides of all steps have handrails. Install guardrails along the edges of any raised decks and porches. Have leaves, snow, or ice cleared regularly. Use sand, salt, or ice melter on paths if you live where there is ice and snow during the winter. Clean up any spills in your garage right away. This includes grease or oil spills. What other actions can I take? Review your medicines with your doctor. Some medicines can cause dizziness or changes in blood pressure, which increase your risk of falling. Wear shoes that: Have a low heel. Do not wear high heels. Have rubber bottoms and are closed at the toe. Feel good on your feet and fit well. Use tools that help you move around if needed. These include: Canes. Walkers. Scooters. Crutches. Ask your doctor what else you can do to help prevent Chapman. This may include seeing a physical therapist to learn to do exercises to move better and get stronger. Where to find more information Centers for Disease Control and Prevention, STEADI: StoreMirror.com.cy [employer] on Aging: AquariamTheater.co.nz National Institute on Aging: AquariamTheater.co.nz Contact a doctor if: You are afraid of falling at home. You feel weak, drowsy, or dizzy at home. You fall at home. Get help right away if you: Lose consciousness or have trouble moving after a fall. Have a fall that causes a head injury. These symptoms may be an emergency. Get help right away. Call 911. Do not wait to see if the symptoms will go away. Do not drive yourself to the hospital. This information is not intended to replace advice given to you by your health care provider. Make sure you discuss any questions you have with your health care provider. Document Revised: 01/20/2022 Document Reviewed: 01/20/2022 Elsevier Patient Education  [name].

## 2022-08-02 DIAGNOSIS — R296 Repeated falls: Secondary | ICD-10-CM | POA: Insufficient documentation

## 2022-08-02 NOTE — Assessment & Plan Note (Signed)
With R sided deficits. Patient and daughter are open to palliative referral given his multiple comorbidities. This was placed today.

## 2022-08-02 NOTE — Assessment & Plan Note (Signed)
Patient with multiple falls recently. Fortunately did not sustain major injury. Left SNF and is now living alone. I am concerned about his safety at home-- he is very high risk of falls due to R sided spastic hemiplegia s/p CVA and he remains on Plavix. I discussed my concern with patient and his daughter. Not interested in SNF or living with family. Discussed risk/benefit of continuing Plavix and daughter feels he should continue it. Continue home health services. Close PCP follow-up.

## 2022-08-04 ENCOUNTER — Other Ambulatory Visit: Payer: Self-pay

## 2022-08-04 DIAGNOSIS — I10 Essential (primary) hypertension: Secondary | ICD-10-CM

## 2022-08-04 MED ORDER — AMLODIPINE BESYLATE 5 MG PO TABS: 10.0000 mg | ORAL_TABLET | Freq: Every day | ORAL | 3 refills | Status: DC

## 2022-08-04 MED ORDER — TAMSULOSIN HCL 0.4 MG PO CAPS: 0.8000 mg | ORAL_CAPSULE | Freq: Every day | ORAL | Status: DC

## 2022-08-04 MED ORDER — CLOPIDOGREL BISULFATE 75 MG PO TABS: 75.0000 mg | ORAL_TABLET | Freq: Every day | ORAL | 2 refills | Status: DC

## 2022-08-04 MED ORDER — ATORVASTATIN CALCIUM 40 MG PO TABS: 40.0000 mg | ORAL_TABLET | Freq: Every day | ORAL | 3 refills | Status: AC

## 2022-08-04 MED ORDER — METFORMIN HCL 1000 MG PO TABS: 1000.0000 mg | ORAL_TABLET | Freq: Two times a day (BID) | ORAL | 3 refills | Status: DC

## 2022-08-05 DIAGNOSIS — N401 Enlarged prostate with lower urinary tract symptoms: Secondary | ICD-10-CM | POA: Diagnosis not present

## 2022-08-05 DIAGNOSIS — N179 Acute kidney failure, unspecified: Secondary | ICD-10-CM | POA: Diagnosis not present

## 2022-08-05 DIAGNOSIS — Z466 Encounter for fitting and adjustment of urinary device: Secondary | ICD-10-CM | POA: Diagnosis not present

## 2022-08-05 DIAGNOSIS — N318 Other neuromuscular dysfunction of bladder: Secondary | ICD-10-CM | POA: Diagnosis not present

## 2022-08-13 DIAGNOSIS — N401 Enlarged prostate with lower urinary tract symptoms: Secondary | ICD-10-CM | POA: Diagnosis not present

## 2022-08-13 DIAGNOSIS — R338 Other retention of urine: Secondary | ICD-10-CM | POA: Diagnosis not present

## 2022-08-13 DIAGNOSIS — C7951 Secondary malignant neoplasm of bone: Secondary | ICD-10-CM | POA: Diagnosis not present

## 2022-08-13 DIAGNOSIS — C61 Malignant neoplasm of prostate: Secondary | ICD-10-CM | POA: Diagnosis not present

## 2022-08-15 ENCOUNTER — Telehealth: Payer: Self-pay

## 2022-08-15 NOTE — Telephone Encounter (Signed)
Philis Nettle, OT with [facility] calls nurse line to report missed visit for today. She was unable to get in contact with the patient.   FYI to PCP.   Talbot Grumbling, RN

## 2022-08-22 ENCOUNTER — Other Ambulatory Visit: Payer: Self-pay | Admitting: Family Medicine

## 2022-08-22 DIAGNOSIS — R5381 Other malaise: Secondary | ICD-10-CM

## 2022-08-22 DIAGNOSIS — I63522 Cerebral infarction due to unspecified occlusion or stenosis of left anterior cerebral artery: Secondary | ICD-10-CM

## 2022-08-25 ENCOUNTER — Ambulatory Visit: Payer: Medicare HMO | Admitting: Family Medicine

## 2022-08-25 ENCOUNTER — Other Ambulatory Visit: Payer: Self-pay | Admitting: Family Medicine

## 2022-08-25 DIAGNOSIS — I63522 Cerebral infarction due to unspecified occlusion or stenosis of left anterior cerebral artery: Secondary | ICD-10-CM

## 2022-08-26 ENCOUNTER — Telehealth: Payer: Self-pay | Admitting: *Deleted

## 2022-08-26 NOTE — Progress Notes (Signed)
  Care Coordination  Outreach Note  08/26/2022 Name: HOLLAN SANANGELO MRN: [id number] DOB: November 19, 1950   Care Coordination Outreach Attempts: An unsuccessful telephone outreach was attempted today to offer the patient information about available care coordination services as a benefit of their health plan.   Follow Up Plan:  Additional outreach attempts will be made to offer the patient care coordination information and services.   Encounter Outcome:  No Answer  [place]  Direct Dial: [phone]

## 2022-08-27 NOTE — Progress Notes (Signed)
  Care Coordination  Outreach Note  08/27/2022 Name: [name] MRN: [id number] DOB: 25-Jul-1950   Care Coordination Outreach Attempts: A second unsuccessful outreach was attempted today to offer the patient with information about available care coordination services as a benefit of their health plan.     Follow Up Plan:  Additional outreach attempts will be made to offer the patient care coordination information and services.   Encounter Outcome:  No Answer   [place]  Direct Dial: [phone]

## 2022-09-01 NOTE — Progress Notes (Signed)
  Care Coordination  Outreach Note  09/01/2022 Name: DAHMIR PIVONKA MRN: [id number] DOB: [birth year]   Care Coordination Outreach Attempts: A third unsuccessful outreach was attempted today to offer the patient with information about available care coordination services as a benefit of their health plan.   Follow Up Plan:  No further outreach attempts will be made at this time. We have been unable to contact the patient to offer or enroll patient in care coordination services  Encounter Outcome:  No Answer  [place]: [phone]

## 2022-09-04 ENCOUNTER — Telehealth: Payer: Self-pay | Admitting: Family Medicine

## 2022-09-04 NOTE — Telephone Encounter (Signed)
Called patient to schedule Medicare Annual Wellness Visit (AWV). Left message for patient to call back and schedule Medicare Annual Wellness Visit (AWV).  Last date of AWV: AWVI eligible as of 07/04/2011  Please schedule an AWVI appointment at any time with [facility] VISIT.  If any questions, please contact me at [phone].    Thank you,  [place] Direct dial  [phone]

## 2022-09-11 ENCOUNTER — Ambulatory Visit: Payer: Medicare HMO | Admitting: Family Medicine

## 2022-09-16 ENCOUNTER — Telehealth: Payer: Self-pay

## 2022-09-16 ENCOUNTER — Other Ambulatory Visit: Payer: Self-pay | Admitting: Family Medicine

## 2022-09-16 DIAGNOSIS — I63522 Cerebral infarction due to unspecified occlusion or stenosis of left anterior cerebral artery: Secondary | ICD-10-CM

## 2022-09-16 DIAGNOSIS — R296 Repeated falls: Secondary | ICD-10-CM

## 2022-09-16 DIAGNOSIS — R5381 Other malaise: Secondary | ICD-10-CM

## 2022-09-16 NOTE — Telephone Encounter (Addendum)
Demetria with [facility] calls nurse line requesting a DME order.   She reports the patient would benefit from a "raised toilet seat."   Please place DME order if appropriate.

## 2022-09-19 DIAGNOSIS — N318 Other neuromuscular dysfunction of bladder: Secondary | ICD-10-CM | POA: Diagnosis not present

## 2022-09-19 DIAGNOSIS — R338 Other retention of urine: Secondary | ICD-10-CM | POA: Diagnosis not present

## 2022-09-19 DIAGNOSIS — N179 Acute kidney failure, unspecified: Secondary | ICD-10-CM | POA: Diagnosis not present

## 2022-09-19 DIAGNOSIS — N401 Enlarged prostate with lower urinary tract symptoms: Secondary | ICD-10-CM | POA: Diagnosis not present

## 2022-09-19 DIAGNOSIS — Z466 Encounter for fitting and adjustment of urinary device: Secondary | ICD-10-CM | POA: Diagnosis not present

## 2022-09-19 DIAGNOSIS — C61 Malignant neoplasm of prostate: Secondary | ICD-10-CM | POA: Diagnosis not present

## 2022-09-23 NOTE — Telephone Encounter (Signed)
Received call from [facility] regarding orders. Community message sent to [employer] for update.   Veronda Prude, RN

## 2022-09-24 DIAGNOSIS — R269 Unspecified abnormalities of gait and mobility: Secondary | ICD-10-CM | POA: Diagnosis not present

## 2022-09-24 DIAGNOSIS — Z9181 History of falling: Secondary | ICD-10-CM | POA: Diagnosis not present

## 2022-09-24 DIAGNOSIS — R5381 Other malaise: Secondary | ICD-10-CM | POA: Diagnosis not present

## 2022-09-28 ENCOUNTER — Encounter (HOSPITAL_COMMUNITY): Payer: Self-pay

## 2022-09-28 ENCOUNTER — Inpatient Hospital Stay (HOSPITAL_COMMUNITY)
Admission: EM | Admit: 2022-09-28 | Discharge: 2022-10-15 | DRG: 699 | Disposition: A | Discharge status: Skilled Nursing Facility | Payer: Medicare HMO | Attending: Family Medicine | Admitting: Family Medicine

## 2022-09-28 ENCOUNTER — Other Ambulatory Visit: Payer: Self-pay

## 2022-09-28 DIAGNOSIS — I10 Essential (primary) hypertension: Secondary | ICD-10-CM | POA: Diagnosis not present

## 2022-09-28 DIAGNOSIS — E119 Type 2 diabetes mellitus without complications: Secondary | ICD-10-CM

## 2022-09-28 DIAGNOSIS — N390 Urinary tract infection, site not specified: Secondary | ICD-10-CM

## 2022-09-28 DIAGNOSIS — C61 Malignant neoplasm of prostate: Secondary | ICD-10-CM

## 2022-09-28 DIAGNOSIS — Y846 Urinary catheterization as the cause of abnormal reaction of the patient, or of later complication, without mention of misadventure at the time of the procedure: Secondary | ICD-10-CM | POA: Diagnosis present

## 2022-09-28 DIAGNOSIS — R4182 Altered mental status, unspecified: Secondary | ICD-10-CM | POA: Diagnosis not present

## 2022-09-28 DIAGNOSIS — D649 Anemia, unspecified: Secondary | ICD-10-CM | POA: Diagnosis present

## 2022-09-28 DIAGNOSIS — R531 Weakness: Principal | ICD-10-CM

## 2022-09-28 DIAGNOSIS — T83021A Displacement of indwelling urethral catheter, initial encounter: Secondary | ICD-10-CM | POA: Diagnosis not present

## 2022-09-28 DIAGNOSIS — Z7984 Long term (current) use of oral hypoglycemic drugs: Secondary | ICD-10-CM

## 2022-09-28 DIAGNOSIS — Z823 Family history of stroke: Secondary | ICD-10-CM

## 2022-09-28 DIAGNOSIS — I878 Other specified disorders of veins: Secondary | ICD-10-CM | POA: Diagnosis present

## 2022-09-28 DIAGNOSIS — R1310 Dysphagia, unspecified: Secondary | ICD-10-CM | POA: Diagnosis present

## 2022-09-28 DIAGNOSIS — R001 Bradycardia, unspecified: Secondary | ICD-10-CM | POA: Diagnosis not present

## 2022-09-28 DIAGNOSIS — Y738 Miscellaneous gastroenterology and urology devices associated with adverse incidents, not elsewhere classified: Secondary | ICD-10-CM | POA: Diagnosis present

## 2022-09-28 DIAGNOSIS — E1151 Type 2 diabetes mellitus with diabetic peripheral angiopathy without gangrene: Secondary | ICD-10-CM | POA: Diagnosis present

## 2022-09-28 DIAGNOSIS — N3001 Acute cystitis with hematuria: Secondary | ICD-10-CM | POA: Diagnosis not present

## 2022-09-28 DIAGNOSIS — R7989 Other specified abnormal findings of blood chemistry: Secondary | ICD-10-CM

## 2022-09-28 DIAGNOSIS — D696 Thrombocytopenia, unspecified: Secondary | ICD-10-CM | POA: Diagnosis present

## 2022-09-28 DIAGNOSIS — N400 Enlarged prostate without lower urinary tract symptoms: Secondary | ICD-10-CM | POA: Diagnosis present

## 2022-09-28 DIAGNOSIS — R739 Hyperglycemia, unspecified: Secondary | ICD-10-CM | POA: Diagnosis not present

## 2022-09-28 DIAGNOSIS — Z794 Long term (current) use of insulin: Secondary | ICD-10-CM

## 2022-09-28 DIAGNOSIS — I129 Hypertensive chronic kidney disease with stage 1 through stage 4 chronic kidney disease, or unspecified chronic kidney disease: Secondary | ICD-10-CM | POA: Diagnosis present

## 2022-09-28 DIAGNOSIS — N139 Obstructive and reflux uropathy, unspecified: Secondary | ICD-10-CM | POA: Diagnosis present

## 2022-09-28 DIAGNOSIS — E785 Hyperlipidemia, unspecified: Secondary | ICD-10-CM | POA: Diagnosis present

## 2022-09-28 DIAGNOSIS — T83091A Other mechanical complication of indwelling urethral catheter, initial encounter: Secondary | ICD-10-CM | POA: Diagnosis not present

## 2022-09-28 DIAGNOSIS — E1122 Type 2 diabetes mellitus with diabetic chronic kidney disease: Secondary | ICD-10-CM | POA: Diagnosis present

## 2022-09-28 DIAGNOSIS — Z8249 Family history of ischemic heart disease and other diseases of the circulatory system: Secondary | ICD-10-CM

## 2022-09-28 DIAGNOSIS — I69351 Hemiplegia and hemiparesis following cerebral infarction affecting right dominant side: Secondary | ICD-10-CM

## 2022-09-28 DIAGNOSIS — R338 Other retention of urine: Secondary | ICD-10-CM | POA: Diagnosis present

## 2022-09-28 DIAGNOSIS — Z743 Need for continuous supervision: Secondary | ICD-10-CM | POA: Diagnosis not present

## 2022-09-28 DIAGNOSIS — Z7952 Long term (current) use of systemic steroids: Secondary | ICD-10-CM

## 2022-09-28 DIAGNOSIS — T83511A Infection and inflammatory reaction due to indwelling urethral catheter, initial encounter: Secondary | ICD-10-CM | POA: Diagnosis present

## 2022-09-28 DIAGNOSIS — Z79899 Other long term (current) drug therapy: Secondary | ICD-10-CM

## 2022-09-28 DIAGNOSIS — R030 Elevated blood-pressure reading, without diagnosis of hypertension: Secondary | ICD-10-CM

## 2022-09-28 DIAGNOSIS — T839XXA Unspecified complication of genitourinary prosthetic device, implant and graft, initial encounter: Secondary | ICD-10-CM

## 2022-09-28 DIAGNOSIS — R109 Unspecified abdominal pain: Secondary | ICD-10-CM | POA: Diagnosis not present

## 2022-09-28 DIAGNOSIS — N189 Chronic kidney disease, unspecified: Secondary | ICD-10-CM | POA: Diagnosis present

## 2022-09-28 DIAGNOSIS — F32A Depression, unspecified: Secondary | ICD-10-CM | POA: Diagnosis present

## 2022-09-28 LAB — COMPREHENSIVE METABOLIC PANEL
ALT: 77 U/L — ABNORMAL HIGH (ref 0–44)
AST: 34 U/L (ref 15–41)
Albumin: 3.4 g/dL — ABNORMAL LOW (ref 3.5–5.0)
Alkaline Phosphatase: 97 U/L (ref 38–126)
Anion gap: 12 (ref 5–15)
BUN: 29 mg/dL — ABNORMAL HIGH (ref 8–23)
CO2: 23 mmol/L (ref 22–32)
Calcium: 9.2 mg/dL (ref 8.9–10.3)
Chloride: 99 mmol/L (ref 98–111)
Creatinine, Ser: 1.36 mg/dL — ABNORMAL HIGH (ref 0.61–1.24)
GFR, Estimated: 56 mL/min — ABNORMAL LOW (ref >60)
Glucose, Bld: 343 mg/dL — ABNORMAL HIGH (ref 70–99)
Potassium: 4 mmol/L (ref 3.5–5.1)
Sodium: 134 mmol/L — ABNORMAL LOW (ref 135–145)
Total Bilirubin: 0.9 mg/dL (ref 0.3–1.2)
Total Protein: 7.3 g/dL (ref 6.5–8.1)

## 2022-09-28 LAB — URINALYSIS, W/ REFLEX TO CULTURE (INFECTION SUSPECTED)
Bilirubin Urine: NEGATIVE
Glucose, UA: >=500 mg/dL — AB
Ketones, ur: NEGATIVE mg/dL
Nitrite: NEGATIVE
Protein, ur: 100 mg/dL — AB
Specific Gravity, Urine: 1.007 (ref 1.005–1.030)
WBC, UA: >50 WBC/hpf (ref 0–5)
pH: 7 (ref 5.0–8.0)

## 2022-09-28 LAB — CBC WITH DIFFERENTIAL/PLATELET
Abs Immature Granulocytes: 0.05 10*3/uL (ref 0.00–0.07)
Basophils Absolute: 0.1 10*3/uL (ref 0.0–0.1)
Basophils Relative: 1 %
Eosinophils Absolute: 0.2 10*3/uL (ref 0.0–0.5)
Eosinophils Relative: 1 %
HCT: 41 % (ref 39.0–52.0)
Hemoglobin: 13.2 g/dL (ref 13.0–17.0)
Immature Granulocytes: 0 %
Lymphocytes Relative: 13 %
Lymphs Abs: 1.6 10*3/uL (ref 0.7–4.0)
MCH: 28.7 pg (ref 26.0–34.0)
MCHC: 32.2 g/dL (ref 30.0–36.0)
MCV: 89.1 fL (ref 80.0–100.0)
Monocytes Absolute: 0.9 10*3/uL (ref 0.1–1.0)
Monocytes Relative: 7 %
Neutro Abs: 9.2 10*3/uL — ABNORMAL HIGH (ref 1.7–7.7)
Neutrophils Relative %: 78 %
Platelets: 149 10*3/uL — ABNORMAL LOW (ref 150–400)
RBC: 4.6 MIL/uL (ref 4.22–5.81)
RDW: 12.6 % (ref 11.5–15.5)
WBC: 11.9 10*3/uL — ABNORMAL HIGH (ref 4.0–10.5)
nRBC: 0 % (ref 0.0–0.2)

## 2022-09-28 LAB — LIPASE, BLOOD: Lipase: 53 U/L — ABNORMAL HIGH (ref 11–51)

## 2022-09-28 MED ORDER — SODIUM CHLORIDE 0.9 % IV BOLUS
500.0000 mL | Freq: Once | INTRAVENOUS | Status: AC
  Administered 2022-09-29: 500 mL via INTRAVENOUS

## 2022-09-28 MED ORDER — SODIUM CHLORIDE 0.9 % IV SOLN
1.0000 g | Freq: Once | INTRAVENOUS | Status: AC
  Administered 2022-09-28: 1 g via INTRAVENOUS
  Filled 2022-09-28: qty 10

## 2022-09-28 NOTE — ED Provider Notes (Signed)
[place] EMERGENCY DEPARTMENT AT [facility] Provider Note   CSN: [id number] Arrival date & time: 09/28/22  2109    History  Chief Complaint  Patient presents with   Abdominal Pain    Pt arrived via ems from home reporting he lives alone and his foley catheter broke this morning causing him abdominal pain. Pt covered in urine and feces unable to care for self    [name] is a 72 y.o. male history of diabetes, hypertension, prior CVA with chronic right-sided deficits, CKD here for evaluation of abdominal pain.  Sounds like his Foley catheter became dislodged and was not draining. He called EMS for abdominal pain.  Here in the emergency department patient states he has been generally weak.  Currently lives by himself.  Unfortunately on arrival he was covered in feces.  He denies any recent falls or injuries.  No fever, headache, chest pain, shortness of breath, diarrhea, bloody stool, cough, congestion.    Been to contact Viyan Rosamond, daughter at [phone], no answer, contacted Aashish Hamm, patient's ex-wife at [phone]  HPI     Home Medications Prior to Admission medications   Medication Sig Start Date End Date Taking? Authorizing Provider  amLODipine (NORVASC) 5 MG tablet Take 2 tablets (10 mg total) by mouth daily. 08/04/22   Ganta, Anupa, DO  atorvastatin (LIPITOR) 40 MG tablet Take 1 tablet (40 mg total) by mouth daily. 08/04/22   Reece Leader, DO  Blood Gluc Meter Disp-Strips (SIDEKICK BLOOD GLUCOSE SYSTEM) DEVI Use for daily testing Patient not taking: Reported on 01/22/2022 08/06/16   Sharon Seller, NP  Blood Pressure Monitor DEVI 1 Units by Does not apply route daily. Check  blood pressure once daily, record readings, bring to doctor's office. Patient not taking: Reported on 01/22/2022 11/27/20   Dollene Cleveland, DO  Capsicum, Cayenne, (CAYENNE PEPPER PO) Take 1 tablet by mouth at bedtime.    [provider]  carvedilol (COREG) 25 MG  tablet TAKE 1 TABLET BY MOUTH TWICE A DAY WITH MEALS 12/04/21   Ganta, Anupa, DO  clopidogrel (PLAVIX) 75 MG tablet Take 1 tablet (75 mg total) by mouth daily. 08/04/22   Ganta, Anupa, DO  empagliflozin (JARDIANCE) 10 MG TABS tablet Take 1 tablet (10 mg total) by mouth daily. 11/26/21   Maury Dus, MD  Insulin Pen Needle (B-D ULTRAFINE III SHORT PEN) 31G X 8 MM MISC USE WITH LEVEMIR 08/06/16   Sharon Seller, NP  metFORMIN (GLUCOPHAGE) 1000 MG tablet Take 1 tablet (1,000 mg total) by mouth 2 (two) times daily with a meal. 08/04/22   Ganta, Anupa, DO  polyethylene glycol (MIRALAX / GLYCOLAX) 17 g packet Take 17 g by mouth daily as needed. 11/25/21   Maury Dus, MD  prednisoLONE acetate (PRED FORTE) 1 % ophthalmic suspension Place 1 drop into the right eye 4 (four) times daily. 01/01/22   [provider]  senna (SENOKOT) 8.6 MG TABS tablet Take 1 tablet (8.6 mg total) by mouth daily as needed for mild constipation. 11/25/21   Maury Dus, MD  sertraline (ZOLOFT) 100 MG tablet TAKE 1 TABLET BY MOUTH EVERY DAY 12/26/21   Ganta, Anupa, DO  tamsulosin (FLOMAX) 0.4 MG CAPS capsule Take 2 capsules (0.8 mg total) by mouth daily. 08/04/22   Reece Leader, DO      Allergies    Pork allergy and Shrimp flavor    Review of Systems   Review of Systems  Constitutional: Negative.   HENT:  Negative.    Respiratory: Negative.    Cardiovascular: Negative.   Gastrointestinal:  Positive for abdominal pain. Negative for abdominal distention, anal bleeding, blood in stool, constipation, diarrhea, nausea, rectal pain and vomiting.  Genitourinary: Negative.   Musculoskeletal: Negative.   Skin: Negative.   Neurological:  Positive for weakness.  All other systems reviewed and are negative.   Physical Exam Updated Vital Signs BP (!) 171/65   Pulse (!) 57   Temp (!) 97.4 F (36.3 C) (Oral)   Resp 18   Ht 5\' 7"  (1.702 m)   Wt 93 kg   SpO2 100%   BMI 32.11 kg/m  Physical Exam Vitals and nursing  note reviewed.  Constitutional:      General: He is not in acute distress.    Appearance: He is well-developed. He is not ill-appearing, toxic-appearing or diaphoretic.  HENT:     Head: Normocephalic and atraumatic.  Eyes:     Pupils: Pupils are equal, round, and reactive to light.  Cardiovascular:     Rate and Rhythm: Normal rate and regular rhythm.     Heart sounds: Normal heart sounds.  Pulmonary:     Effort: Pulmonary effort is normal. No respiratory distress.     Breath sounds: Normal breath sounds.  Abdominal:     General: Bowel sounds are normal. There is no distension.     Palpations: Abdomen is soft.     Tenderness: There is abdominal tenderness in the suprapubic area. There is no right CVA tenderness, left CVA tenderness, guarding or rebound.     Hernia: No hernia is present.     Comments: Bladder distention, suprapubic tenderness. Foley removed by nursing prior to my arrival  Genitourinary:    Comments: No sacral wounds Musculoskeletal:        General: Normal range of motion.     Cervical back: Normal range of motion and neck supple.  Skin:    General: Skin is warm and dry.     Comments: No sacral ulcerations Covered in dried feces  Neurological:     General: No focal deficit present.     Mental Status: He is alert and oriented to person, place, and time.     Comments: Right hemiparesis Slurred speech> chronic per family  Alert to person, place, time, location     ED Results / Procedures / Treatments   Labs (all labs ordered are listed, but only abnormal results are displayed) Labs Reviewed  URINALYSIS, W/ REFLEX TO CULTURE (INFECTION SUSPECTED) - Abnormal; Notable for the following components:      Result Value   APPearance TURBID (*)    Glucose, UA >=500 (*)    Hgb urine dipstick MODERATE (*)    Protein, ur 100 (*)    Leukocytes,Ua LARGE (*)    Bacteria, UA MANY (*)    All other components within normal limits  CBC WITH DIFFERENTIAL/PLATELET -  Abnormal; Notable for the following components:   WBC 11.9 (*)    Platelets 149 (*)    Neutro Abs 9.2 (*)    All other components within normal limits  COMPREHENSIVE METABOLIC PANEL - Abnormal; Notable for the following components:   Sodium 134 (*)    Glucose, Bld 343 (*)    BUN 29 (*)    Creatinine, Ser 1.36 (*)    Albumin 3.4 (*)    ALT 77 (*)    GFR, Estimated 56 (*)    All other components within normal limits  LIPASE, BLOOD -  Abnormal; Notable for the following components:   Lipase 53 (*)    All other components within normal limits  URINE CULTURE    EKG None  Radiology No results found.  Procedures Procedures    Medications Ordered in ED Medications  cefTRIAXone (ROCEPHIN) 1 g in sodium chloride 0.9 % 100 mL IVPB (1 g Intravenous New Bag/Given 09/28/22 2340)  sodium chloride 0.9 % bolus 500 mL (has no administration in time range)   ED Course/ Medical Decision Making/ A&P   72 year old multiple medical problems, chronic right-sided hemiparesis from prior CVA, chronic indwelling Foley due to prostate issues here for evaluation of abdominal pain.  Sounds like his Foley catheter became dislodged earlier today and was not draining.  By the time I assessed patient in the room nursing had removed his Foley catheter.  He has some bladder distention and some tenderness.  He states he feels generally weak.  He has right-sided hemiparesis at baseline and slurred speech however he spoke with his ex-wife via telephone she stated this is his baseline.  He is alert and oriented to person, place and time.  Heart and lungs are clear.  He has no sacral wounds.  He is hypertensive however no headache, new neurologic deficits.  Chart review seems he is was to be on chronic Keflex however patient does not think he takes chronic antibiotics. Will plan on replacing Foley catheter, basic labs and reassess  Labs and imaging personally viewed and interpreted:  CBC leukocytosis 16.9 UA large  leuks, many bacteria, greater than 50 WBC on new Foley catheter Lipase 53 Metabolic panel sodium 134, glucose 343, creatinine 1.36  Reassessed.  Hypertensive.  He is unsure if he is taken his medications today.  No headache, numbness, lateral weakness, chest pain, shortness of breath or emesis.  Low suspicion for hypertensive urgency or emergency.  Given his urine is positive for UTI he endorses some no weakness, lives on his own will admit for further workup  CONSULT with FM teaching who agrees to evaluate patient for admission  The patient appears reasonably stabilized for admission considering the current resources, flow, and capabilities available in the ED at this time, and I doubt any other [facility] requiring further screening and/or treatment in the ED prior to admission.                              Medical Decision Making Amount and/or Complexity of Data Reviewed Independent Historian: spouse and EMS External Data Reviewed: labs, ECG and notes. Labs: ordered. Decision-making details documented in ED Course. ECG/medicine tests: ordered and independent interpretation performed. Decision-making details documented in ED Course.  Risk OTC drugs. Prescription drug management. Parenteral controlled substances. Decision regarding hospitalization. Diagnosis or treatment significantly limited by social determinants of health.         Final Clinical Impression(s) / ED Diagnoses Final diagnoses:  Weakness  Acute cystitis with hematuria  Complication of Foley catheter, initial encounter (HCC)  Elevated blood pressure reading    Rx / DC Orders ED Discharge Orders     None         Porsha Skilton A, PA-C 09/28/22 2354    Gwyneth Sprout, MD 09/30/22 2346

## 2022-09-28 NOTE — H&P (Signed)
Hospital Admission History and Physical Service Pager: [phone]  Patient name: [name] Medical record number: [id number] Date of Birth: 07-21-1950 Age: 72 y.o. Gender: male  Primary Care Provider: Reece Leader, DO Consultants: None Code Status: Full Preferred Emergency Contact:   Fairley, Copher" Daughter [phone]   Chief Complaint: Abdominal pain  Assessment and Plan: [name] is a 72 y.o. male presenting with abdominal pain and dislodged Foley catheter.  At time of admission, abdominal pain had resolved.  Suspect abdominal pain 2/2 distended bladder (drained ~1200 ml once Foley replaced, and resolved pain).  Low suspicion for pancreatitis (lipase 53, no nausea or vomiting), low suspicion for liver/GB (no localizing findings, mildly elevated ALT).  No epigastric pain, NVD, nor symptoms of GERD, lower suspicion for PUD and gastroenteritis. 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  Received ceftriaxone in ED, pending urine cultures.  Plan to admit for observation.  Patient lives alone, unfortunately presented to ED covered in feces.  Right-sided upper and lower extremity deficits at baseline with dysarthria. Question his ability to care for himself, will obtain PT/OT evaluation.  However patient and family have declined SNF in the past.  At PCP appointment in March, patient expressed interest in palliative outpatient care-patient would benefit from palliative services and will discuss further during hospitalization. Additional differential and plan as below.  * Abdominal pain Resolved at time of admission.  Suspect 2/2 urinary retention, as pain resolved with replacement of Foley catheter.  Does have elevated ALT (77), will repeat CMP in AM.  Reports normal stools, consider adding bowel regimen if patient endorses constipation. If patient develops  localizing findings or infectious symptoms, will consider imaging and obtaining blood cultures. - Admit for observation FMTS, med-surg, attending Dr. Jennette Kettle - Monitor urinary output - Continue Flomax - AM CBC, BMP  Elevated serum creatinine Creatinine 1.36 on admission, baseline ~1.1-1.2.  Suspect primarily 2/2 urinary obstruction and dehydration.  S/p 500 mL NS bolus in ED.  Anticipate this will improve now that Foley is in place and patient has received IV fluids. - A.m. 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  Unclear if patient is supposed to be taking prophylactic Keflex.  UA clean-catch concerning for infection, however suspect patient is likely colonized.  S/p 1 dose ceftriaxone in ED, pending urine cultures. - S/p CTX x 1, redose in PM - Follow-up urine cultures  Essential hypertension Blood pressure 182/62 on admission without signs of endorgan damage.  Patient has not taken blood pressure medication in 24 hours. - Restart amlodipine, carvedilol - CTM   Other chronic and stable medical conditions T2DM: Restart Jardiance, hold metformin CVA history/PAD/HLD: Continue Plavix, Lipitor Depression: Continue sertraline Prostate cancer: Continue tamsulosin   FEN/GI: Dysphagia 3 VTE Prophylaxis: Lovenox  Disposition: MedSurg  History of Present Illness:  [name] is a 72 y.o. male presenting with abdominal pain in setting of urinary obstruction.  Patient denies nausea, vomiting, diarrhea.  No localizing symptoms.  No fevers, chills, weight loss that he has noticed. Experiencing discomfort when Foley catheter was dislodged.  Reportedly covered in feces at presentation to ED.  Patient denies new deficits.  Baseline deficits of right-sided weakness and dysarthria from prior stroke.  In the ED, Foley catheter was replaced and patient had approximately 1200 mL output.  He received 500 mL bolus.  UA concerning for infection.  Received 1 dose of ceftriaxone.  Family medicine paged for admission.  Review Of Systems: Per HPI  Pertinent Past Medical History: CVA history Depression Diabetes type 2 HTN Prostamegaly 2/2 cancer Chronic indwelling Foley catheter  Remainder reviewed in history tab.   Pertinent Past Surgical History: No abdominal surgeries Remainder reviewed in history tab.   Pertinent Social History: Tobacco use: Denies Alcohol use: Denies Other Substance use: Denies Lives alone  Pertinent Family History:  Remainder reviewed in history tab.   Important Outpatient Medications: Amlodipine Lipitor Carvedilol Plavix Jardiance Metformin Sertraline Tamsulosin  Objective: BP (!) 182/67   Pulse 70   Temp (!) 97.4 F (36.3 C) (Oral)   Resp (!) 21   Ht 5\' 7"  (1.702 m)   Wt 93 kg   SpO2 100%   BMI 32.11 kg/m  Exam: General: NAD, chronically ill-appearing, resting comfortably in bed Eyes: Normal conjunctiva Neck: Free range of motion Cardiovascular: RRR, no murmurs Respiratory: CTAB, normal work of breathing on room air Gastrointestinal: Soft, not tender, not distended.  Bowel sounds present. Derm: Warm and dry Neuro: CN II: PERRL CN III, IV,VI: EOMI CV V: Normal sensation in V1, V2, V3 CVII: Ride side mouth droop.  CN VIII: Normal hearing CN IX,X: Symmetric palate raise  CN XI: 5/5 shoulder shrug CN XII: Symmetric tongue protrusion  LEFT UE and LE strength 5/5 RIGHT UE and LE strength 4/5 Normal sensation in UE and LE bilaterally   Labs:  CBC BMET  Recent Labs  Lab 09/28/22 2240  WBC 11.9*  HGB 13.2  HCT 41.0  PLT 149*   Recent Labs  Lab 09/28/22 2240  NA 134*  K 4.0  CL 99  CO2 23  BUN 29*  CREATININE 1.36*  GLUCOSE 343*  CALCIUM 9.2     Lipase: 53 UA: Large leukocytes, bacteria many  EKG: Normal sinus, bigeminy   Imaging Studies Performed:  None  Tiffany Kocher, DO 09/29/2022, 1:02 AM PGY-1, [facility]  FPTS Intern  pager: [phone], text pages welcome Secure chat group [facility] Teaching Service

## 2022-09-29 DIAGNOSIS — D649 Anemia, unspecified: Secondary | ICD-10-CM | POA: Diagnosis not present

## 2022-09-29 DIAGNOSIS — R531 Weakness: Principal | ICD-10-CM

## 2022-09-29 DIAGNOSIS — T83021A Displacement of indwelling urethral catheter, initial encounter: Secondary | ICD-10-CM | POA: Diagnosis not present

## 2022-09-29 DIAGNOSIS — Z515 Encounter for palliative care: Secondary | ICD-10-CM | POA: Diagnosis not present

## 2022-09-29 DIAGNOSIS — T839XXA Unspecified complication of genitourinary prosthetic device, implant and graft, initial encounter: Secondary | ICD-10-CM

## 2022-09-29 DIAGNOSIS — I69351 Hemiplegia and hemiparesis following cerebral infarction affecting right dominant side: Secondary | ICD-10-CM | POA: Diagnosis not present

## 2022-09-29 DIAGNOSIS — E1151 Type 2 diabetes mellitus with diabetic peripheral angiopathy without gangrene: Secondary | ICD-10-CM | POA: Diagnosis not present

## 2022-09-29 DIAGNOSIS — R338 Other retention of urine: Secondary | ICD-10-CM | POA: Diagnosis not present

## 2022-09-29 DIAGNOSIS — Z794 Long term (current) use of insulin: Secondary | ICD-10-CM | POA: Diagnosis not present

## 2022-09-29 DIAGNOSIS — N3001 Acute cystitis with hematuria: Secondary | ICD-10-CM | POA: Diagnosis not present

## 2022-09-29 DIAGNOSIS — N400 Enlarged prostate without lower urinary tract symptoms: Secondary | ICD-10-CM | POA: Diagnosis not present

## 2022-09-29 DIAGNOSIS — I129 Hypertensive chronic kidney disease with stage 1 through stage 4 chronic kidney disease, or unspecified chronic kidney disease: Secondary | ICD-10-CM | POA: Diagnosis not present

## 2022-09-29 DIAGNOSIS — R109 Unspecified abdominal pain: Secondary | ICD-10-CM | POA: Diagnosis present

## 2022-09-29 DIAGNOSIS — Z823 Family history of stroke: Secondary | ICD-10-CM | POA: Diagnosis not present

## 2022-09-29 DIAGNOSIS — Z79899 Other long term (current) drug therapy: Secondary | ICD-10-CM | POA: Diagnosis not present

## 2022-09-29 DIAGNOSIS — D696 Thrombocytopenia, unspecified: Secondary | ICD-10-CM | POA: Diagnosis not present

## 2022-09-29 DIAGNOSIS — E785 Hyperlipidemia, unspecified: Secondary | ICD-10-CM | POA: Diagnosis not present

## 2022-09-29 DIAGNOSIS — N3000 Acute cystitis without hematuria: Secondary | ICD-10-CM | POA: Diagnosis not present

## 2022-09-29 DIAGNOSIS — Z7952 Long term (current) use of systemic steroids: Secondary | ICD-10-CM | POA: Diagnosis not present

## 2022-09-29 DIAGNOSIS — T83511A Infection and inflammatory reaction due to indwelling urethral catheter, initial encounter: Secondary | ICD-10-CM | POA: Diagnosis not present

## 2022-09-29 DIAGNOSIS — Z7189 Other specified counseling: Secondary | ICD-10-CM | POA: Diagnosis not present

## 2022-09-29 DIAGNOSIS — Z8249 Family history of ischemic heart disease and other diseases of the circulatory system: Secondary | ICD-10-CM | POA: Diagnosis not present

## 2022-09-29 DIAGNOSIS — Y738 Miscellaneous gastroenterology and urology devices associated with adverse incidents, not elsewhere classified: Secondary | ICD-10-CM | POA: Diagnosis not present

## 2022-09-29 DIAGNOSIS — T839XXD Unspecified complication of genitourinary prosthetic device, implant and graft, subsequent encounter: Secondary | ICD-10-CM | POA: Diagnosis not present

## 2022-09-29 DIAGNOSIS — N139 Obstructive and reflux uropathy, unspecified: Secondary | ICD-10-CM | POA: Diagnosis not present

## 2022-09-29 DIAGNOSIS — C61 Malignant neoplasm of prostate: Secondary | ICD-10-CM | POA: Insufficient documentation

## 2022-09-29 DIAGNOSIS — N390 Urinary tract infection, site not specified: Secondary | ICD-10-CM

## 2022-09-29 DIAGNOSIS — I878 Other specified disorders of veins: Secondary | ICD-10-CM | POA: Diagnosis present

## 2022-09-29 DIAGNOSIS — Z7984 Long term (current) use of oral hypoglycemic drugs: Secondary | ICD-10-CM | POA: Diagnosis not present

## 2022-09-29 DIAGNOSIS — E1122 Type 2 diabetes mellitus with diabetic chronic kidney disease: Secondary | ICD-10-CM | POA: Diagnosis not present

## 2022-09-29 DIAGNOSIS — N189 Chronic kidney disease, unspecified: Secondary | ICD-10-CM | POA: Diagnosis not present

## 2022-09-29 DIAGNOSIS — R7989 Other specified abnormal findings of blood chemistry: Secondary | ICD-10-CM | POA: Insufficient documentation

## 2022-09-29 DIAGNOSIS — E1159 Type 2 diabetes mellitus with other circulatory complications: Secondary | ICD-10-CM | POA: Diagnosis not present

## 2022-09-29 DIAGNOSIS — R1084 Generalized abdominal pain: Secondary | ICD-10-CM | POA: Diagnosis not present

## 2022-09-29 DIAGNOSIS — F32A Depression, unspecified: Secondary | ICD-10-CM | POA: Diagnosis not present

## 2022-09-29 DIAGNOSIS — R1310 Dysphagia, unspecified: Secondary | ICD-10-CM | POA: Diagnosis not present

## 2022-09-29 DIAGNOSIS — Y846 Urinary catheterization as the cause of abnormal reaction of the patient, or of later complication, without mention of misadventure at the time of the procedure: Secondary | ICD-10-CM | POA: Diagnosis not present

## 2022-09-29 LAB — COMPREHENSIVE METABOLIC PANEL
ALT: 57 U/L — ABNORMAL HIGH (ref 0–44)
AST: 23 U/L (ref 15–41)
Albumin: 2.7 g/dL — ABNORMAL LOW (ref 3.5–5.0)
Alkaline Phosphatase: 77 U/L (ref 38–126)
Anion gap: 8 (ref 5–15)
BUN: 23 mg/dL (ref 8–23)
CO2: 26 mmol/L (ref 22–32)
Calcium: 8.6 mg/dL — ABNORMAL LOW (ref 8.9–10.3)
Chloride: 103 mmol/L (ref 98–111)
Creatinine, Ser: 1.16 mg/dL (ref 0.61–1.24)
GFR, Estimated: >60 mL/min (ref >60)
Glucose, Bld: 284 mg/dL — ABNORMAL HIGH (ref 70–99)
Potassium: 3.7 mmol/L (ref 3.5–5.1)
Sodium: 137 mmol/L (ref 135–145)
Total Bilirubin: 0.7 mg/dL (ref 0.3–1.2)
Total Protein: 5.9 g/dL — ABNORMAL LOW (ref 6.5–8.1)

## 2022-09-29 LAB — CBC
HCT: 37.2 % — ABNORMAL LOW (ref 39.0–52.0)
Hemoglobin: 12.2 g/dL — ABNORMAL LOW (ref 13.0–17.0)
MCH: 28.4 pg (ref 26.0–34.0)
MCHC: 32.8 g/dL (ref 30.0–36.0)
MCV: 86.7 fL (ref 80.0–100.0)
Platelets: 146 10*3/uL — ABNORMAL LOW (ref 150–400)
RBC: 4.29 MIL/uL (ref 4.22–5.81)
RDW: 12.7 % (ref 11.5–15.5)
WBC: 8.9 10*3/uL (ref 4.0–10.5)
nRBC: 0 % (ref 0.0–0.2)

## 2022-09-29 LAB — GLUCOSE, CAPILLARY
Glucose-Capillary: 362 mg/dL — ABNORMAL HIGH (ref 70–99)
Glucose-Capillary: 461 mg/dL — ABNORMAL HIGH (ref 70–99)
Glucose-Capillary: 490 mg/dL — ABNORMAL HIGH (ref 70–99)
Glucose-Capillary: 457 mg/dL — ABNORMAL HIGH (ref 70–99)
Glucose-Capillary: 272 mg/dL — ABNORMAL HIGH (ref 70–99)
Glucose-Capillary: 162 mg/dL — ABNORMAL HIGH (ref 70–99)

## 2022-09-29 LAB — HEMOGLOBIN A1C
Hgb A1c MFr Bld: 9.7 % — ABNORMAL HIGH (ref 4.8–5.6)
Mean Plasma Glucose: 231.69 mg/dL

## 2022-09-29 MED ORDER — INSULIN ASPART 100 UNIT/ML IJ SOLN
0.0000 [IU] | Freq: Three times a day (TID) | INTRAMUSCULAR | Status: DC
  Administered 2022-09-29: 9 [IU] via SUBCUTANEOUS
  Administered 2022-09-29: 5 [IU] via SUBCUTANEOUS
  Administered 2022-09-30: 3 [IU] via SUBCUTANEOUS
  Administered 2022-09-30: 5 [IU] via SUBCUTANEOUS
  Administered 2022-09-30: 2 [IU] via SUBCUTANEOUS
  Administered 2022-10-01 (×2): 5 [IU] via SUBCUTANEOUS
  Administered 2022-10-01: 9 [IU] via SUBCUTANEOUS

## 2022-09-29 MED ORDER — ABIRATERONE ACETATE 250 MG PO TABS
1000.0000 mg | ORAL_TABLET | Freq: Every day | ORAL | Status: DC
  Administered 2022-09-30 – 2022-10-15 (×15): 1000 mg via ORAL
  Filled 2022-09-29 (×18): qty 4

## 2022-09-29 MED ORDER — ENOXAPARIN SODIUM 40 MG/0.4ML IJ SOSY
40.0000 mg | PREFILLED_SYRINGE | INTRAMUSCULAR | Status: DC
  Administered 2022-09-29 – 2022-10-15 (×17): 40 mg via SUBCUTANEOUS
  Filled 2022-09-29 (×17): qty 0.4

## 2022-09-29 MED ORDER — CLOPIDOGREL BISULFATE 75 MG PO TABS
75.0000 mg | ORAL_TABLET | Freq: Every day | ORAL | Status: DC
  Administered 2022-09-29 – 2022-10-15 (×17): 75 mg via ORAL
  Filled 2022-09-29 (×17): qty 1

## 2022-09-29 MED ORDER — SERTRALINE HCL 100 MG PO TABS
100.0000 mg | ORAL_TABLET | Freq: Every day | ORAL | Status: DC
  Administered 2022-09-29 – 2022-10-15 (×17): 100 mg via ORAL
  Filled 2022-09-29 (×17): qty 1

## 2022-09-29 MED ORDER — TAMSULOSIN HCL 0.4 MG PO CAPS
0.8000 mg | ORAL_CAPSULE | Freq: Every day | ORAL | Status: DC
  Administered 2022-09-29 – 2022-10-15 (×17): 0.8 mg via ORAL
  Filled 2022-09-29 (×17): qty 2

## 2022-09-29 MED ORDER — CARVEDILOL 25 MG PO TABS
25.0000 mg | ORAL_TABLET | Freq: Two times a day (BID) | ORAL | Status: DC
  Administered 2022-09-29 – 2022-10-06 (×14): 25 mg via ORAL
  Filled 2022-09-29 (×15): qty 1

## 2022-09-29 MED ORDER — AMLODIPINE BESYLATE 10 MG PO TABS
10.0000 mg | ORAL_TABLET | Freq: Every day | ORAL | Status: DC
  Administered 2022-09-29 – 2022-10-07 (×9): 10 mg via ORAL
  Filled 2022-09-29 (×9): qty 1

## 2022-09-29 MED ORDER — EMPAGLIFLOZIN 10 MG PO TABS
10.0000 mg | ORAL_TABLET | Freq: Every day | ORAL | Status: DC
  Administered 2022-09-29 – 2022-10-15 (×17): 10 mg via ORAL
  Filled 2022-09-29 (×17): qty 1

## 2022-09-29 MED ORDER — PREDNISONE 5 MG PO TABS
5.0000 mg | ORAL_TABLET | Freq: Every day | ORAL | Status: DC
  Administered 2022-09-30: 5 mg via ORAL
  Filled 2022-09-29: qty 1

## 2022-09-29 MED ORDER — ATORVASTATIN CALCIUM 40 MG PO TABS
40.0000 mg | ORAL_TABLET | Freq: Every day | ORAL | Status: DC
  Administered 2022-09-29 – 2022-10-15 (×17): 40 mg via ORAL
  Filled 2022-09-29 (×16): qty 1

## 2022-09-29 MED ORDER — SODIUM CHLORIDE 0.9 % IV SOLN
1.0000 g | INTRAVENOUS | Status: DC
  Administered 2022-09-29 – 2022-09-30 (×2): 1 g via INTRAVENOUS
  Filled 2022-09-29 (×2): qty 10

## 2022-09-29 MED ORDER — CHLORHEXIDINE GLUCONATE CLOTH 2 % EX PADS
6.0000 | MEDICATED_PAD | Freq: Every day | CUTANEOUS | Status: DC
  Administered 2022-09-29 – 2022-10-15 (×17): 6 via TOPICAL

## 2022-09-29 NOTE — ED Notes (Signed)
ED TO INPATIENT HANDOFF REPORT  ED Nurse Name and Phone #: Porfirio Mylar [phone]  S Name/Age/Gender [name] 72 y.o. male Room/Bed: 009C/009C  Code Status   Code Status: Full Code  Home/SNF/Other Home Patient oriented to: self, place, time, and situation Is this baseline? Yes   Triage Complete: Triage complete  Chief Complaint Abdominal pain [R10.9]  Triage Note No notes on file   Allergies Allergies  Allergen Reactions   Pork Allergy    Shrimp Flavor Other (See Comments)    Doesn't like it    Level of Care/Admitting Diagnosis ED Disposition     ED Disposition  Admit   Condition  --   Comment  Hospital Area: [facility] [[id number]]  Level of Care: Med-Surg [16]  May place patient in observation at [facility] or [facility] if equivalent level of care is available:: No  Covid Evaluation: Asymptomatic - no recent exposure (last 10 days) testing not required  Diagnosis: Abdominal pain [454098]  Admitting Physician: Tiffany Kocher [[id number]]  Attending Physician: Nestor Ramp [[id number]]          B Medical/Surgery History Past Medical History:  Diagnosis Date   CVA (cerebral infarction) 01/03/2008   MRI: Acute 1 x 1.5 cm infarction affecting the left side of the pons.   Diabetes mellitus    DVT (deep venous thrombosis) (HCC)    Hypertension    PAD (peripheral artery disease) (HCC)    Stroke (HCC)    2008   Past Surgical History:  Procedure Laterality Date   COLONOSCOPY     None       A IV Location/Drains/Wounds Patient Lines/Drains/Airways Status     Active Line/Drains/Airways     Name Placement date Placement time Site Days   Peripheral IV 09/28/22 20 G Left Antecubital 09/28/22  2230  Antecubital  1   Urethral Catheter Kiser RN Non-latex 16 Fr. 09/28/22  2130  Non-latex  1            Intake/Output Last 24 hours  Intake/Output Summary (Last 24 hours) at 09/29/2022 0744 Last data filed at 09/29/2022 0107 Gross per 24  hour  Intake 599.74 ml  Output 2400 ml  Net -1800.26 ml    Labs/Imaging Results for orders placed or performed during the hospital encounter of 09/28/22 (from the past 48 hour(s))  Urinalysis, w/ Reflex to Culture (Infection Suspected) -Urine, Catheterized     Status: Abnormal   Collection Time: 09/28/22 10:40 PM  Result Value Ref Range   Specimen Source URINE, CLEAN CATCH    Color, Urine YELLOW YELLOW   APPearance TURBID (A) CLEAR   Specific Gravity, Urine 1.007 1.005 - 1.030   pH 7.0 5.0 - 8.0   Glucose, UA >=500 (A) NEGATIVE mg/dL   Hgb urine dipstick MODERATE (A) NEGATIVE   Bilirubin Urine NEGATIVE NEGATIVE   Ketones, ur NEGATIVE NEGATIVE mg/dL   Protein, ur 295 (A) NEGATIVE mg/dL   Nitrite NEGATIVE NEGATIVE   Leukocytes,Ua LARGE (A) NEGATIVE   RBC / HPF 21-50 0 - 5 RBC/hpf   WBC, UA >50 0 - 5 WBC/hpf    Comment:        Reflex urine culture not performed if WBC <=10, OR if Squamous epithelial cells >5. If Squamous epithelial cells >5 suggest recollection.    Bacteria, UA MANY (A) NONE SEEN   Squamous Epithelial / HPF 0-5 0 - 5 /HPF   WBC Clumps PRESENT     Comment: Performed at [facility] Lab, [address] [place], [place] [address]  CBC with Differential     Status: Abnormal   Collection Time: 09/28/22 10:40 PM  Result Value Ref Range   WBC 11.9 (H) 4.0 - 10.5 K/uL   RBC 4.60 4.22 - 5.81 MIL/uL   Hemoglobin 13.2 13.0 - 17.0 g/dL   HCT 60.4 54.0 - 98.1 %   MCV 89.1 80.0 - 100.0 fL   MCH 28.7 26.0 - 34.0 pg   MCHC 32.2 30.0 - 36.0 g/dL   RDW 19.1 47.8 - 29.5 %   Platelets 149 (L) 150 - 400 K/uL   nRBC 0.0 0.0 - 0.2 %   Neutrophils Relative % 78 %   Neutro Abs 9.2 (H) 1.7 - 7.7 K/uL   Lymphocytes Relative 13 %   Lymphs Abs 1.6 0.7 - 4.0 K/uL   Monocytes Relative 7 %   Monocytes Absolute 0.9 0.1 - 1.0 K/uL   Eosinophils Relative 1 %   Eosinophils Absolute 0.2 0.0 - 0.5 K/uL   Basophils Relative 1 %   Basophils Absolute 0.1 0.0 - 0.1 K/uL   Immature  Granulocytes 0 %   Abs Immature Granulocytes 0.05 0.00 - 0.07 K/uL    Comment: Performed at [facility], [address], [place], [place] [address]  Comprehensive metabolic panel     Status: Abnormal   Collection Time: 09/28/22 10:40 PM  Result Value Ref Range   Sodium 134 (L) 135 - 145 mmol/L   Potassium 4.0 3.5 - 5.1 mmol/L   Chloride 99 98 - 111 mmol/L   CO2 23 22 - 32 mmol/L   Glucose, Bld 343 (H) 70 - 99 mg/dL    Comment: Glucose reference range applies only to samples taken after fasting for at least 8 hours.   BUN 29 (H) 8 - 23 mg/dL   Creatinine, Ser 8.65 (H) 0.61 - 1.24 mg/dL   Calcium 9.2 8.9 - 78.4 mg/dL   Total Protein 7.3 6.5 - 8.1 g/dL   Albumin 3.4 (L) 3.5 - 5.0 g/dL   AST 34 15 - 41 U/L   ALT 77 (H) 0 - 44 U/L   Alkaline Phosphatase 97 38 - 126 U/L   Total Bilirubin 0.9 0.3 - 1.2 mg/dL   GFR, Estimated 56 (L) >60 mL/min    Comment: (NOTE) Calculated using the CKD-EPI Creatinine Equation (2021)    Anion gap 12 5 - 15    Comment: Performed at [facility], [address], [place], [place] [address]  Lipase, blood     Status: Abnormal   Collection Time: 09/28/22 10:40 PM  Result Value Ref Range   Lipase 53 (H) 11 - 51 U/L    Comment: Performed at [facility] Lab, [address] [place], [place] [address]  Urine Culture     Status: None (Preliminary result)   Collection Time: 09/28/22 10:40 PM   Specimen: Urine, Catheterized  Result Value Ref Range   Specimen Description URINE, CATHETERIZED    Special Requests      NONE Reflexed from [id number] Performed at [facility] Lab, [address] [place], [place] [address]    Culture PENDING    Report Status PENDING   Comprehensive metabolic panel     Status: Abnormal   Collection Time: 09/29/22  5:00 AM  Result Value Ref Range   Sodium 137 135 - 145 mmol/L   Potassium 3.7 3.5 - 5.1 mmol/L   Chloride 103 98 - 111  mmol/L   CO2 26 22 - 32 mmol/L   Glucose, Bld 284 (H) 70 - 99 mg/dL    Comment:  Glucose reference range applies only to samples taken after fasting for at least 8 hours.   BUN 23 8 - 23 mg/dL   Creatinine, Ser 9.14 0.61 - 1.24 mg/dL   Calcium 8.6 (L) 8.9 - 10.3 mg/dL   Total Protein 5.9 (L) 6.5 - 8.1 g/dL   Albumin 2.7 (L) 3.5 - 5.0 g/dL   AST 23 15 - 41 U/L   ALT 57 (H) 0 - 44 U/L   Alkaline Phosphatase 77 38 - 126 U/L   Total Bilirubin 0.7 0.3 - 1.2 mg/dL   GFR, Estimated >78 >29 mL/min    Comment: (NOTE) Calculated using the CKD-EPI Creatinine Equation (2021)    Anion gap 8 5 - 15    Comment: Performed at [facility], [address] [place], [place] [address]  CBC     Status: Abnormal   Collection Time: 09/29/22  5:00 AM  Result Value Ref Range   WBC 8.9 4.0 - 10.5 K/uL   RBC 4.29 4.22 - 5.81 MIL/uL   Hemoglobin 12.2 (L) 13.0 - 17.0 g/dL   HCT 08.6 (L) 57.8 - 46.9 %   MCV 86.7 80.0 - 100.0 fL   MCH 28.4 26.0 - 34.0 pg   MCHC 32.8 30.0 - 36.0 g/dL   RDW 62.9 52.8 - 41.3 %   Platelets 146 (L) 150 - 400 K/uL   nRBC 0.0 0.0 - 0.2 %    Comment: Performed at [facility] Lab, [address], [place], [place] [address]   No results found.  Pending Labs Unresulted Labs (From admission, onward)    None       Vitals/Pain Today's Vitals   09/29/22 0000 09/29/22 0100 09/29/22 0300 09/29/22 0625  BP: (!) 182/67 (!) 175/66 (!) 166/61 (!) 160/65  Pulse: 70 70 (!) 56 (!) 59  Resp: (!) 21 (!) 8 17 12   Temp:      TempSrc:      SpO2: 100% 100% 95% 100%  Weight:      Height:      PainSc:        Isolation Precautions No active isolations  Medications Medications  amLODipine (NORVASC) tablet 10 mg (has no administration in time range)  atorvastatin (LIPITOR) tablet 40 mg (has no administration in time range)  carvedilol (COREG) tablet 25 mg (has no administration in time range)  sertraline (ZOLOFT) tablet 100 mg (has no administration in time range)  empagliflozin (JARDIANCE) tablet 10 mg (has no administration in time range)  tamsulosin  (FLOMAX) capsule 0.8 mg (has no administration in time range)  clopidogrel (PLAVIX) tablet 75 mg (has no administration in time range)  enoxaparin (LOVENOX) injection 40 mg (has no administration in time range)  cefTRIAXone (ROCEPHIN) 1 g in sodium chloride 0.9 % 100 mL IVPB (0 g Intravenous Stopped 09/29/22 0012)  sodium chloride 0.9 % bolus 500 mL (0 mLs Intravenous Stopped 09/29/22 0101)    Mobility walks     Focused Assessments Cardiac Assessment Handoff:    Lab Results  Component Value Date   CKTOTAL 167 11/15/2021   CKMB 11.3 (H) 01/02/2008   TROPONINI <0.03 06/29/2016   No results found for: "DDIMER" Does the Patient currently have chest pain? No   , Renal Assessment Handoff:  Hemodialysis Schedule:  Last Hemodialysis date and time:    Restricted appendage:  R Recommendations: See Admitting Provider Note  Report given to:   Additional Notes:

## 2022-09-29 NOTE — Assessment & Plan Note (Addendum)
Stable, sugars improved with increased meal coverage. Of note A1c was <6 when patient was taking victoza in 2019- Dc'd because it was well controlled. Consider restarting OP to simplify regimen. - Continue Novolog to 10u TID w/ meals - Continue Semglee 12 U - cont mSSI - Continue Jardiance

## 2022-09-29 NOTE — Inpatient Diabetes Management (Signed)
Inpatient Diabetes Program Recommendations  AACE/ADA: New Consensus Statement on Inpatient Glycemic Control (2015)  Target Ranges:  Prepandial:   less than 140 mg/dL      Peak postprandial:   less than 180 mg/dL (1-2 hours)      Critically ill patients:  140 - 180 mg/dL    Latest Reference Range & Units 09/28/22 22:40 09/29/22 05:00  Glucose 70 - 99 mg/dL 045 (H) 409 (H)  (H): Data is abnormally high   Admit with: Abdominal pain and dislodged Foley catheter/ UTI  History: DM2, Chronic indwelling Foley 2/2 prostamegaly from prostate cancer  Home DM Meds: Jardiance 10 mg Daily        Metformin 1000 mg BID  Current Orders: Jardiance 10 mg Daily    MD- Note there is currently no order for CBG checks  Please consider:  1. Add CBG checks TID AC + HS  2. Start Novolog Sensitive Correction Scale/ SSI (0-9 units) TID AC + HS  3. Check current A1c Level    --Will follow patient during hospitalization--  Ambrose Finland RN, MSN, CDCES Diabetes Coordinator Inpatient Glycemic Control Team Team Pager: [phone] (8a-5p)

## 2022-09-29 NOTE — Progress Notes (Signed)
Physical Therapy Evaluation Patient Details Name: [name] MRN: [id number] DOB: 08/21/1950 Today's Date: 09/29/2022  History of Present Illness  72 yo male presenting 4/28 with abdominal pain and dislodged foley catheter, at home in difficulty caring for himself. Has fallen just prior to admission, has UTI and elevated creatinine.  PMH - CVA, DM, DVT, HTN, PVD, ckd, PAD, prostate CA  Clinical Impression  Pt was seen for walk with support on LUE to walk a short trip with RUE maintained for safety with PT.  He is a fairly limited distance walker as he is on R toes to some degree and reports he was up a lot with SPC or rollator.  Neither device is currently practical, and would need close guard with HW vs quad cane of a chair to leave the room.  Taking just a few steps currently and will be best assisted with a rehab stay to restore his PLOF as pt reports.  Currently is at wheelchair level to get around safely which can also be practiced in this setting before rehab.  Recommending <3 hours a day rehab, which per chart pt has already declined.  Without this, would need family or caregiver to be available for his day, which presently is not available every day.  Follow along with him for strengthening and control of standing balance, work on R ankle standing posture and safety education.         Recommendations for follow up therapy are one component of a multi-disciplinary discharge planning process, led by the attending physician.  Recommendations may be updated based on patient status, additional functional criteria and insurance authorization.  Follow Up Recommendations       Assistance Recommended at Discharge Frequent or constant Supervision/Assistance  Patient can return home with the following  A lot of help with walking and/or transfers;A little help with bathing/dressing/bathroom;Assistance with cooking/housework;Assist for transportation;Help with stairs or ramp for entrance     Equipment Recommendations None recommended by PT  Recommendations for Other Services       Functional Status Assessment Patient has had a recent decline in their functional status and demonstrates the ability to make significant improvements in function in a reasonable and predictable amount of time.     Precautions / Restrictions Precautions Precautions: Fall Precaution Comments: catheter, baseline R hemiplegia Restrictions Weight Bearing Restrictions: No Other Position/Activity Restrictions: cannot grip walker well with R hand      Mobility  Bed Mobility               General bed mobility comments: up in chair when PT arrives    Transfers Overall transfer level: Needs assistance Equipment used: 1 person hand held assist Transfers: Sit to/from Stand Sit to Stand: Mod assist           General transfer comment: transition to walker as pt stands but cannot get RUE to open and use on walker well    Ambulation/Gait Ambulation/Gait assistance: Min assist Gait Distance (Feet): 8 Feet Assistive device: Rolling walker (2 wheels) Gait Pattern/deviations: Step-to pattern, Decreased stride length, Decreased stance time - right, Decreased weight shift to right, Wide base of support, Trunk flexed Gait velocity: reduced Gait velocity interpretation: <1.31 ft/sec, indicative of household ambulator Pre-gait activities: standing balance skills General Gait Details: pt is actively using LLE more to wb and take steps, walking on toes on RLE which is not biomechanically advantageous.  Stairs            Psychologist, prison and probation services  Modified Rankin (Stroke Patients Only)       Balance Overall balance assessment: Needs assistance Sitting-balance support: Feet supported Sitting balance-Leahy Scale: Fair     Standing balance support: Single extremity supported, During functional activity Standing balance-Leahy Scale: Poor Standing balance comment: using LUE to support gait  which is likely his baseline with SPC but endurance is very low for this strategy                             Pertinent Vitals/Pain Pain Assessment Pain Assessment: No/denies pain    Home Living Family/patient expects to be discharged to:: Skilled nursing facility Living Arrangements: Spouse/significant other Available Help at Discharge: Family;Available PRN/intermittently Type of Home: House Home Access: Ramped entrance       Home Layout: One level Home Equipment: Cane - quad;BSC/3in1;Tub bench;Rollator (4 wheels) Additional Comments: per chart pt is home alone    Prior Function Prior Level of Function : Needs assist       Physical Assist : Mobility (physical) Mobility (physical): Gait   Mobility Comments: states he is on [facility] but at times rollator ADLs Comments: most of the time patient does it himself,     Hand Dominance   Dominant Hand: Right    Extremity/Trunk Assessment   Upper Extremity Assessment Upper Extremity Assessment: Defer to OT evaluation RUE Deficits / Details: elbow flexoin 90 degrees with AAROM to 100 degrees, hand contractures, pt with decreased digit extension. pt denies wearing splint. reports splint is lost. Wrist with no extension in a neutral position.reports sensation is the same RUE Coordination: decreased fine motor;decreased gross motor    Lower Extremity Assessment Lower Extremity Assessment: Generalized weakness RLE Deficits / Details: has PF contracture and walking on his toes in evaluation    Cervical / Trunk Assessment Cervical / Trunk Assessment: Kyphotic  Communication   Communication: Expressive difficulties  Cognition Arousal/Alertness: Awake/alert Behavior During Therapy: WFL for tasks assessed/performed Overall Cognitive Status: Within Functional Limits for tasks assessed                                          General Comments General comments (skin integrity, edema, etc.): Pt is assisted  to walk with RW as support for a very short trip of 8' with dense cues and support for safety but is completely unable to do this alone.  May have used quad cane but would be limited for distance and unsafe, recommending him to go to rehab but pt is clear he is going home    Exercises Other Exercises Other Exercises: R DF is 3 to 3-, otherwise is 3+ to 4- RLE strength   Assessment/Plan    PT Assessment Patient needs continued PT services  PT Problem List Decreased strength;Decreased range of motion;Decreased activity tolerance;Decreased balance;Decreased mobility;Decreased coordination;Decreased safety awareness       PT Treatment Interventions DME instruction;Gait training;Functional mobility training;Therapeutic activities;Therapeutic exercise;Balance training;Neuromuscular re-education;Patient/family education    PT Goals (Current goals can be found in the Care Plan section)  Acute Rehab PT Goals Patient Stated Goal: to go home soon PT Goal Formulation: With patient Time For Goal Achievement: 10/13/22 Potential to Achieve Goals: Fair    Frequency Min 3X/week     Co-evaluation               AM-PAC PT "6 Clicks" Mobility  Outcome  Measure Help needed turning from your back to your side while in a flat bed without using bedrails?: A Little Help needed moving from lying on your back to sitting on the side of a flat bed without using bedrails?: A Lot   Help needed standing up from a chair using your arms (e.g., wheelchair or bedside chair)?: A Lot Help needed to walk in hospital room?: A Little Help needed climbing 3-5 steps with a railing? : Total 6 Click Score: 11    End of Session Equipment Utilized During Treatment: Gait belt Activity Tolerance: Patient limited by fatigue;Treatment limited secondary to medical complications (Comment) Patient left: in chair;with call bell/phone within reach;with chair alarm set Nurse Communication: Mobility status PT Visit Diagnosis:  Unsteadiness on feet (R26.81);Muscle weakness (generalized) (M62.81);Difficulty in walking, not elsewhere classified (R26.2);Hemiplegia and hemiparesis Hemiplegia - Right/Left: Right Hemiplegia - dominant/non-dominant: Dominant    Time: 9562-1308 PT Time Calculation (min) (ACUTE ONLY): 30 min   Charges:   PT Evaluation $PT Eval Moderate Complexity: 1 Mod PT Treatments $Therapeutic Activity: 8-22 mins       Ivar Drape 09/29/2022, 1:12 PM  Samul Dada, PT PhD Acute Rehab Dept. Number: [facility] [id number] and [facility] [phone]

## 2022-09-29 NOTE — Progress Notes (Addendum)
Daily Progress Note Intern Pager: [phone]  Patient name: [name] Medical record number: [id number] Date of birth: Oct 18, 1950 Age: 72 y.o. Gender: male  Primary Care Provider: Reece Leader, DO Consultants: None Code Status: FULL  Pt Overview and Major Events to Date:  4/28 - Admission 4/29- palliative consult placed for goals of care discussion  Assessment and Plan: [name] is a 72 y.o. male presenting with abdominal pain and dislodged Foley catheter. . 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  * Abdominal pain Resolved at time of admission.  Having adequate urine output, diuresed 2.4 L.  Reports abdominal pain has resolved.  Downtrending ALT 77 > 57, AST 34 > 23. WBC a 11.9 > 8.9.  CEA downtrending reports normal stools, consider adding bowel regimen if patient endorses constipation. If patient develops localizing findings or infectious symptoms, will consider imaging and obtaining blood cultures. - Monitor urinary output - Continue Flomax - AM CBC, BMP  Prostate cancer (HCC) History of obstructive uropathy in the setting of prostate cancer currently being treated with Zytiga/prednisone combination pill.  Spoke with daughter and she who confirms she will bring his home medications at bedside as this is not currently available on formulary.   Elevated serum creatinine Creatinine 1.36 on admission, improving to 1.16 this morning baseline ~1.1-1.2. S/p 500 mL NS bolus in ED.  - CTM  Urinary tract infection Initially presenting for dislodged chronic indwelling foley 2/2 prostamegaly from prostate cancer. UCx pending, collected overnight. Continuing IV Ceftriaxone today with plan to transition to p.o. ABX tomorrow, has remained afebrile.  Unclear if patient is supposed to be taking prophylactic Keflex.  - S/p CTX x 2 - Follow-up urine cultures  Essential hypertension Remains hypertensive ranging in the  160s-low 180s, on admission without signs of endorgan damage.  Home HTN meds restarted on admission.  - Amlodipine 10 mg daily - Carvedilol 25 mg twice daily with meals - CTM  T2DM (type 2 diabetes mellitus) (HCC) A1c 9.7%.  Patient initially restarted on home Jardiance for management of his elevated CBGs.  Sliding scale has been included today as his CBGs are trending up to the 450s. -Continue Jardiance -Start sSSI, can advance as required. Goal CBGs <180     FEN/GI: Regular PPx: Lovenox 40 mg [place] Dispo:Pending PT recommendations  pending clinical improvement .  Subjective:  Patient assessed at bedside, reports he is doing well this morning.  Recounts portions of his history prior to admission, explains that his indwelling catheter had been dislodged and called for an ambulance when he began to experience urinary retention and significant abdominal pain.  Reports that this morning abdominal pain has resolved, endorses adequate sleep and appetite.  He is amenable to discussing goals of care with palliative today  Objective: Temp:  [97.4 F (36.3 C)-99 F (37.2 C)] 98.7 F (37.1 C) (04/29 1258) Pulse Rate:  [56-70] 58 (04/29 1258) Resp:  [8-22] 18 (04/29 1258) BP: (132-233)/(41-74) 132/61 (04/29 1258) SpO2:  [95 %-100 %] 100 % (04/29 1258) Weight:  [93 kg] 93 kg (04/28 2113) Physical Exam: General: Chronically ill gentleman, pleasant, warm and cooperative Cardiovascular: RRR, no M/R/G Respiratory: Normal work of breathing on room air, lung sounds clear anteriorly Abdomen: Nontender, nondistended, normal active bowel sounds Extremities: ROM normal, right-sided deficits not out of proportion to previous CVA.  Laboratory: Most recent CBC Lab Results  Component Value Date   WBC 8.9 09/29/2022   HGB 12.2 (L) 09/29/2022  HCT 37.2 (L) 09/29/2022   MCV 86.7 09/29/2022   PLT 146 (L) 09/29/2022   Most recent BMP    Latest Ref Rng & Units 09/29/2022    5:00 AM  BMP  Glucose  70 - 99 mg/dL 161   BUN 8 - 23 mg/dL 23   Creatinine 0.96 - 1.24 mg/dL 0.45   Sodium 409 - 811 mmol/L 137   Potassium 3.5 - 5.1 mmol/L 3.7   Chloride 98 - 111 mmol/L 103   CO2 22 - 32 mmol/L 26   Calcium 8.9 - 10.3 mg/dL 8.6     Other pertinent labs : A1c 9.7%  Imaging/Diagnostic Tests: No imaging studies today   Lorri Frederick, MD 09/29/2022, 2:01 PM PGY-1, [facility] Family Medicine FPTS Intern pager: [phone], text pages welcome Secure chat group [facility] Teaching Service

## 2022-09-29 NOTE — Assessment & Plan Note (Deleted)
-   Cont miralax and senna

## 2022-09-29 NOTE — Progress Notes (Addendum)
I called patient's daughter, Karoline Caldwell, and his ex-wife, [name], as requested by patient and to gather more information about his home situation. They are both care-givers for him when able. I informed both of them of why he was admitted and that while he is doing well, we have concerns about discharging him back home and he came in covered in feces.   Daughter states pt lives alone, does have a hx of defecating on himself.  Both daughter and ex-wife have concerns about his ability to care for himself.  He currently has a Charity fundraiser from [facility] come weekly, but doesn't always let them inside and doesn't pick up his phone.  [name] thinks he needs someone to check on him at least once daily to ensure he takes his medications and help him with ADLs because she and [name] both have jobs and aren't able to come every day. [name] thinks he will want to stay at home and will not agree to SNF placement even if it were recommended.   Per Karoline Caldwell, he has an apt with urology next week. Daughter helps coordinate apts.  He takes Prednisone (w/ food) and Zytiga (w/ out food) for prostate cancer.

## 2022-09-29 NOTE — Progress Notes (Signed)
OT EVALUATION  PT admitted with dislodged foley catheter. Pt currently with functional limitiations due to the deficits listed below (see OT problem list). Pt reports living with wife that works and being home alone during the day. Pt reports that he immediately came to the hospital on Sunday after noticing an issue with foley. Pt currently demonstrates balance deficits with basic transfers.  Pt will benefit from skilled OT to increase their independence and safety with adls and balance to allow discharge skilled inpatient follow up therapy, <3 hours/day. Marland Kitchen    09/29/22 1000  OT Visit Information  Last OT Received On 09/29/22  Assistance Needed +1  History of Present Illness 72 yo male presenting with abdominal pain and dislodged foley catheter. PMH - CVA, DM, DVT, HTN, PVD, ckd, PAD, prostate CA  Precautions  Precautions Fall  Precaution Comments catheter, baseline R hemiplegia  Home Living  Family/patient expects to be discharged to: Skilled nursing facility  Living Arrangements Spouse/significant other (wife works during the day)  Available Help at Discharge Family;Available PRN/intermittently  Type of Home House  Home Access Ramped entrance  Home Layout One level  Bathroom Shower/Tub Sponge bathes at baseline  Dealer - quad;BSC/3in1;Tub bench;Rollator (4 wheels)  Additional Comments wife works  Prior Function  Prior Level of Function  Needs assist  Mobility Comments mostly the cane but uses the walker when he has to go further  ADLs Comments most of the time patient does it himself,  Communication  Communication Expressive difficulties  Pain Assessment  Pain Assessment No/denies pain  Cognition  Arousal/Alertness Awake/alert  Behavior During Therapy WFL for tasks assessed/performed  Overall Cognitive Status Within Functional Limits for tasks assessed  General Comments very clearly can communicate month, location and reason for admission   Upper Extremity Assessment  Upper Extremity Assessment RUE deficits/detail  RUE Deficits / Details elbow flexoin 90 degrees with AAROM to 100 degrees, hand contractures, pt with decreased digit extension. pt denies wearing splint. reports splint is lost. Wrist with no extension in a neutral position.reports sensation is the same  RUE Coordination decreased fine motor;decreased gross motor  Lower Extremity Assessment  Lower Extremity Assessment RLE deficits/detail  RLE Deficits / Details reports no special devisces for R LE  Cervical / Trunk Assessment  Cervical / Trunk Assessment Kyphotic  Vision- History  Baseline Vision/History 1 Wears glasses  Patient Visual Report No change from baseline  Vision- Assessment  Additional Comments reading glasses, pt says its normal as it was when i had the cataracts removed  ADL  Overall ADL's  Needs assistance/impaired  Eating/Feeding Set up  Eating/Feeding Details (indicate cue type and reason) pt requires readjusting in the bed on arrival. pt was unable to reach food or see food. Pt with bed adjusted and setup of tray. pt was able to self feed.  Grooming Minimal assistance;Sitting  Upper Body Bathing Moderate assistance  Lower Body Bathing Maximal assistance  Upper Body Dressing  Moderate assistance  Lower Body Dressing Maximal assistance  Toilet Transfer Moderate assistance  Bed Mobility  Overal bed mobility Needs Assistance  Bed Mobility Supine to Sit;Rolling  Rolling Min guard  Supine to sit Min assist;HOB elevated  General bed mobility comments pt in max HOB position and min (A) to power up from surface. pt completed all self feeding in bed chair position and progressed to recliner for apple sauce self feeding.  Transfers  Overall transfer level Needs assistance  Equipment used 1 person hand held  assist  Transfers Sit to/from Stand  Sit to Stand Mod assist  General transfer comment pt transfer toward R side with weight shift facilitated  by OT and full upright transfer to recliner. the recliner arm rest down. pt with pillow placed on R side for lateral support with self feeding  Balance  Overall balance assessment Mild deficits observed, not formally tested  General Comments  General comments (skin integrity, edema, etc.) RA  OT - End of Session  Equipment Utilized During Treatment Gait belt  Activity Tolerance Patient tolerated treatment well  Patient left in chair;with call bell/phone within reach;with chair alarm set  Nurse Communication Mobility status;Precautions  OT Assessment  OT Recommendation/Assessment Patient needs continued OT Services  OT Visit Diagnosis Unsteadiness on feet (R26.81);Muscle weakness (generalized) (M62.81)  OT Problem List Impaired balance (sitting and/or standing);Decreased knowledge of precautions;Decreased knowledge of use of DME or AE;Decreased safety awareness;Impaired UE functional use  OT Plan  OT Frequency (ACUTE ONLY) Min 1X/week  OT Treatment/Interventions (ACUTE ONLY) Self-care/ADL training;Therapeutic exercise;Neuromuscular education;Energy conservation;DME and/or AE instruction;Manual therapy;Modalities;Therapeutic activities;Patient/family education;Balance training  AM-PAC OT "6 Clicks" Daily Activity Outcome Measure (Version 2)  Help from another person eating meals? 3  Help from another person taking care of personal grooming? 3  Help from another person toileting, which includes using toliet, bedpan, or urinal? 2  Help from another person bathing (including washing, rinsing, drying)? 2  Help from another person to put on and taking off regular upper body clothing? 3  Help from another person to put on and taking off regular lower body clothing? 2  6 Click Score 15  Progressive Mobility  What is the highest level of mobility based on the progressive mobility assessment? Level 3 (Stands with assist) - Balance while standing  and cannot march in place  Mobility Referral No   Activity Transferred from bed to chair  OT Recommendation  Follow Up Recommendations Skilled nursing-short term rehab (<3 hours/day)  Assistance recommended at discharge Set up Supervision/Assistance  Patient can return home with the following A lot of help with walking and/or transfers;A lot of help with bathing/dressing/bathroom  Functional Status Assessent Patient has had a recent decline in their functional status and demonstrates the ability to make significant improvements in function in a reasonable and predictable amount of time.  OT Equipment  (TBA)  Individuals Consulted  Consulted and Agree with Results and Recommendations Patient  Acute Rehab OT Goals  Patient Stated Goal pt wanted to watch TV and eat  OT Goal Formulation With patient  Time For Goal Achievement 10/13/22  Potential to Achieve Goals Good  OT Time Calculation  OT Start Time (ACUTE ONLY) 1007  OT Stop Time (ACUTE ONLY) 1045  OT Time Calculation (min) 38 min  OT General Charges  $OT Visit 1 Visit  OT Evaluation  $OT Eval Moderate Complexity 1 Mod  OT Treatments  $Self Care/Home Management  8-22 mins  Written Expression  Dominant Hand Right   Brynn, OTR/L  Acute Rehabilitation Services Office: [phone] .

## 2022-09-29 NOTE — Assessment & Plan Note (Addendum)
H/o prostate cancer causing obstructive uropathy and necessity for foley catheter. Continue Zytiga/prednisone  -Palliative care following -Plan for OP Urology follow up

## 2022-09-29 NOTE — Assessment & Plan Note (Deleted)
Unclear if true UTI vs chronic colonization. Pt empirically treated and will finish 3day CTX today. - Ceftriaxone 4/29-5/1

## 2022-09-29 NOTE — Plan of Care (Signed)
  Problem: Education: Goal: Knowledge of General Education information will improve Description Including pain rating scale, medication(s)/side effects and non-pharmacologic comfort measures Outcome: Progressing   Problem: Health Behavior/Discharge Planning: Goal: Ability to manage health-related needs will improve Outcome: Progressing   Problem: Clinical Measurements: Goal: Ability to maintain clinical measurements within normal limits will improve Outcome: Progressing Goal: Will remain free from infection Outcome: Progressing Goal: Diagnostic test results will improve Outcome: Progressing Goal: Respiratory complications will improve Outcome: Progressing Goal: Cardiovascular complication will be avoided Outcome: Progressing   Problem: Elimination: Goal: Will not experience complications related to bowel motility Outcome: Progressing Goal: Will not experience complications related to urinary retention Outcome: Progressing   Problem: Skin Integrity: Goal: Risk for impaired skin integrity will decrease Outcome: Progressing   

## 2022-09-29 NOTE — Hospital Course (Addendum)
[name] is a 72 y.o.male with a history of previous CVA, HTN, T2DM, prostamegaly 2/2 cancer, and depressio who was admitted to the [facility] Medicine Teaching Service at [facility] for abdominal pain. His hospital course is detailed below:  Abdominal pain UTI in the setting of chronic indwelling foley catheter Abdominal pain resolved at the time of admission and likely due to urinary retention as it resolved when his Foley was replaced (it had become dislodged prior to admission). 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  Palliative care followed for goals of care discussion. UA/UC concerning for UTI, grew gram negative rods. Started on CTX and received 3 day course. Transient elevation in creatinine that improved to baseline during hospital stay with IV and oral rehydration. Denies abdominal pain and has good UOP at the time of discharge.  T2DM A1c 9.7, BGL elevated to 300s. Continued on home home Jardiance. Started on Semglee 5U and Novolog 3U TID with meals. Blood glucose continued to elevate. Titrated insulin to 20 U semglee and 7 U novolog TID with meals. Restarted home metformin. Discharged on the following regimen Lantus 10 U daily, metformin 1000 BID, Jardiance.   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 PT/OT recommended SNF for rehabilitation. Unfortunately [facility] Pouch declined the insurance authorization. Facility willing to accept medicaid, but patient is not agreeable to spending down to qualify. Per PT/OT, patient was improving gradually and successfully were able to sned to SNF for further improvement.    Other chronic conditions were medically managed with home medications and formulary alternatives as necessary (h/o CVA, depression)  PCP Follow-up Recommendations:  Ensure outpatient urology follow up Revisit diabetes regimen. Assess necessity of insulin. Consider GLP-1 if appropriate. Decreased carvedilol to 12.5 mg  twice daily for bradycardia.  Reevaluate for hypertension and bradycardia.

## 2022-09-29 NOTE — Assessment & Plan Note (Deleted)
Cr improved to baseline. Good UOP.

## 2022-09-29 NOTE — Assessment & Plan Note (Addendum)
Stable - Amlodipine 10 mg daily  - Carvedilol 12.5 mg BID

## 2022-09-30 DIAGNOSIS — Z7189 Other specified counseling: Secondary | ICD-10-CM

## 2022-09-30 DIAGNOSIS — T839XXA Unspecified complication of genitourinary prosthetic device, implant and graft, initial encounter: Secondary | ICD-10-CM | POA: Diagnosis not present

## 2022-09-30 DIAGNOSIS — R1084 Generalized abdominal pain: Secondary | ICD-10-CM

## 2022-09-30 DIAGNOSIS — N3000 Acute cystitis without hematuria: Secondary | ICD-10-CM | POA: Diagnosis not present

## 2022-09-30 DIAGNOSIS — R531 Weakness: Secondary | ICD-10-CM | POA: Diagnosis not present

## 2022-09-30 LAB — BASIC METABOLIC PANEL
Anion gap: 9 (ref 5–15)
BUN: 30 mg/dL — ABNORMAL HIGH (ref 8–23)
CO2: 25 mmol/L (ref 22–32)
Calcium: 8.6 mg/dL — ABNORMAL LOW (ref 8.9–10.3)
Chloride: 103 mmol/L (ref 98–111)
Creatinine, Ser: 1.43 mg/dL — ABNORMAL HIGH (ref 0.61–1.24)
GFR, Estimated: 52 mL/min — ABNORMAL LOW (ref >60)
Glucose, Bld: 239 mg/dL — ABNORMAL HIGH (ref 70–99)
Potassium: 3.8 mmol/L (ref 3.5–5.1)
Sodium: 137 mmol/L (ref 135–145)

## 2022-09-30 LAB — CBC
HCT: 32.3 % — ABNORMAL LOW (ref 39.0–52.0)
Hemoglobin: 10.7 g/dL — ABNORMAL LOW (ref 13.0–17.0)
MCH: 28.8 pg (ref 26.0–34.0)
MCHC: 33.1 g/dL (ref 30.0–36.0)
MCV: 86.8 fL (ref 80.0–100.0)
Platelets: 133 10*3/uL — ABNORMAL LOW (ref 150–400)
RBC: 3.72 MIL/uL — ABNORMAL LOW (ref 4.22–5.81)
RDW: 12.7 % (ref 11.5–15.5)
WBC: 8.5 10*3/uL (ref 4.0–10.5)
nRBC: 0 % (ref 0.0–0.2)

## 2022-09-30 LAB — URINE CULTURE

## 2022-09-30 LAB — GLUCOSE, CAPILLARY
Glucose-Capillary: 151 mg/dL — ABNORMAL HIGH (ref 70–99)
Glucose-Capillary: 242 mg/dL — ABNORMAL HIGH (ref 70–99)
Glucose-Capillary: 292 mg/dL — ABNORMAL HIGH (ref 70–99)
Glucose-Capillary: 266 mg/dL — ABNORMAL HIGH (ref 70–99)

## 2022-09-30 MED ORDER — PREDNISONE 5 MG PO TABS
5.0000 mg | ORAL_TABLET | Freq: Every day | ORAL | Status: DC
  Administered 2022-10-01 – 2022-10-15 (×15): 5 mg via ORAL
  Filled 2022-09-30 (×16): qty 1

## 2022-09-30 NOTE — Inpatient Diabetes Management (Signed)
Inpatient Diabetes Program Recommendations  AACE/ADA: New Consensus Statement on Inpatient Glycemic Control (2015)  Target Ranges:  Prepandial:   less than 140 mg/dL      Peak postprandial:   less than 180 mg/dL (1-2 hours)      Critically ill patients:  140 - 180 mg/dL   Lab Results  Component Value Date   GLUCAP 242 (H) 09/30/2022   HGBA1C 9.7 (H) 09/29/2022    Review of Glycemic Control  Latest Reference Range & Units 09/30/22 07:20 09/30/22 11:16  Glucose-Capillary 70 - 99 mg/dL 161 (H) 096 (H)  (H): Data is abnormally high  History: DM2, Chronic indwelling Foley 2/2 prostamegaly from prostate cancer   Home DM Meds: Jardiance 10 mg Daily                              Metformin 1000 mg BID   Current Orders: Jardiance 10 mg Daily, Novolog 0-9 units TID, Prednisone 5 mg QD  Inpatient Diabetes Program Recommendations:    Novolog 3 units TID with meals if he consumes at least 50%.  Will continue to follow while inpatient.  Thank you, Dulce Sellar, MSN, CDCES Diabetes Coordinator Inpatient Diabetes Program [phone] (team pager from 8a-5p)

## 2022-09-30 NOTE — Progress Notes (Signed)
Physical Therapy Treatment Patient Details Name: [name] MRN: [id number] DOB: 21-Apr-1951 Today's Date: 09/30/2022   History of Present Illness 72 yo male presenting 4/28 with abdominal pain and dislodged foley catheter, at home in difficulty caring for himself. Has fallen just prior to admission, has UTI and elevated creatinine.  PMH - CVA, DM, DVT, HTN, PVD, ckd, PAD, prostate CA    PT Comments    Pt greeted supine in bed, sleeping but easily woken and agreeable to session. Pt with slow but steady progress towards acute goals this session with focus on standing tolerance. Pt able to come to stand with mod A to steady on rise with pt pushing up from EOB with LUE and using L hand to place R hand on RW. Pt able to take steps toward [facility] and complete standing therex with cues for technique and light min A to maintain balance. Pt declining further ambulation or transfer to chair this session due to fatigue. Pt continues to benefit from skilled PT services to progress toward functional mobility goals.      Recommendations for follow up therapy are one component of a multi-disciplinary discharge planning process, led by the attending physician.  Recommendations may be updated based on patient status, additional functional criteria and insurance authorization.  Follow Up Recommendations       Assistance Recommended at Discharge Frequent or constant Supervision/Assistance  Patient can return home with the following A lot of help with walking and/or transfers;A little help with bathing/dressing/bathroom;Assistance with cooking/housework;Assist for transportation;Help with stairs or ramp for entrance   Equipment Recommendations  None recommended by PT    Recommendations for Other Services       Precautions / Restrictions Precautions Precautions: Fall Precaution Comments: catheter, baseline R hemiplegia Restrictions Weight Bearing Restrictions: No Other Position/Activity Restrictions:  cannot grip walker well with R hand     Mobility  Bed Mobility Overal bed mobility: Needs Assistance Bed Mobility: Supine to Sit, Sit to Supine Rolling: Min guard   Supine to sit: Min assist, HOB elevated Sit to supine: Min guard   General bed mobility comments: min A to eleavte trunk into sitting from sidelying, min gaurd to return to supine    Transfers Overall transfer level: Needs assistance Equipment used: Rolling walker (2 wheels) Transfers: Sit to/from Stand Sit to Stand: Mod assist, Min assist           General transfer comment: mod A to power up without AD and then place R hand on RW with L    Ambulation/Gait Ambulation/Gait assistance: Min assist Gait Distance (Feet): 4 Feet Assistive device: Rolling walker (2 wheels)   Gait velocity: reduced     General Gait Details: side stepping to [employer]   Stairs             Wheelchair Mobility    Modified Rankin (Stroke Patients Only)       Balance Overall balance assessment: Needs assistance Sitting-balance support: Feet supported Sitting balance-Leahy Scale: Fair     Standing balance support: Single extremity supported, During functional activity Standing balance-Leahy Scale: Poor Standing balance comment: using LUE to support gait which is likely his baseline with SPC but endurance is very low for this strategy                            Cognition Arousal/Alertness: Awake/alert Behavior During Therapy: WFL for tasks assessed/performed Overall Cognitive Status: Within Functional Limits for tasks assessed  Exercises Other Exercises Other Exercises: marching in standing (not alternating), R x10, L x10 to emphasize improved single leg balance Other Exercises: supine bridges x10    General Comments        Pertinent Vitals/Pain Pain Assessment Pain Assessment: Faces Faces Pain Scale: Hurts a little bit Pain Location: bil  LEs Pain Descriptors / Indicators: Tightness, Sore Pain Intervention(s): Monitored during session, Limited activity within patient's tolerance, Repositioned    Home Living                          Prior Function            PT Goals (current goals can now be found in the care plan section) Acute Rehab PT Goals PT Goal Formulation: With patient Time For Goal Achievement: 10/13/22 Progress towards PT goals: Progressing toward goals    Frequency    Min 3X/week      PT Plan      Co-evaluation              AM-PAC PT "6 Clicks" Mobility   Outcome Measure  Help needed turning from your back to your side while in a flat bed without using bedrails?: A Little Help needed moving from lying on your back to sitting on the side of a flat bed without using bedrails?: A Lot Help needed moving to and from a bed to a chair (including a wheelchair)?: A Lot Help needed standing up from a chair using your arms (e.g., wheelchair or bedside chair)?: A Lot Help needed to walk in hospital room?: A Little Help needed climbing 3-5 steps with a railing? : Total 6 Click Score: 13    End of Session Equipment Utilized During Treatment: Gait belt Activity Tolerance: Patient limited by fatigue Patient left: with call bell/phone within reach;with chair alarm set;in bed Nurse Communication: Mobility status PT Visit Diagnosis: Unsteadiness on feet (R26.81);Muscle weakness (generalized) (M62.81);Difficulty in walking, not elsewhere classified (R26.2);Hemiplegia and hemiparesis Hemiplegia - Right/Left: Right Hemiplegia - dominant/non-dominant: Dominant     Time: 1610-9604 PT Time Calculation (min) (ACUTE ONLY): 24 min  Charges:  $Therapeutic Exercise: 8-22 mins $Therapeutic Activity: 8-22 mins                     Zayne Draheim R. PTA Acute Rehabilitation Services Office: [phone]    Catalina Antigua 09/30/2022, 4:24 PM

## 2022-09-30 NOTE — NC FL2 (Signed)
[place] MEDICAID FL2 LEVEL OF CARE FORM     IDENTIFICATION  Patient Name: [name] Birthdate: 02-23-1951 Sex: male Admission Date (Current Location): 09/28/2022  [place] and [place] Number:  Haynes Bast [id number] Facility and Address:  [facility] [place], [address] [place], [place] [address]      Provider Number: [id number]  Attending Physician Name and Address:  Nestor Ramp, MD  Relative Name and Phone Number:  Madison, Albea" (Daughter) [phone]    Current Level of Care: Hospital Recommended Level of Care: Skilled Nursing Facility Prior Approval Number:    Date Approved/Denied:   PASRR Number: [id number]  Discharge Plan: SNF    Current Diagnoses: Patient Active Problem List   Diagnosis Date Noted   Abdominal pain 09/29/2022   Urinary tract infection 09/29/2022   Elevated serum creatinine 09/29/2022   Prostate cancer (HCC) 09/29/2022   Weakness 09/29/2022   Complication of Foley catheter (HCC) 09/29/2022   Multiple falls 08/02/2022   Paronychia of great toe 03/05/2022   Hydronephrosis    Acute hyperkalemia    Obstructive uropathy 11/15/2021   Metabolic acidosis, increased anion gap 11/15/2021   Pleural effusion 11/15/2021   Acute renal failure (ARF) (HCC) 11/15/2021   Pain due to onychomycosis of toenails of both feet 12/19/2020   Physical deconditioning 11/27/2020   Dysequilibrium 11/27/2020   Spastic hemiplegia of right dominant side due to cerebrovascular disease (HCC) 11/03/2017   Muscle wasting 09/25/2017   Depression 07/14/2017   Dysarthria as late effect of stroke 08/18/2016   Stage 2 chronic kidney disease    Leukocytosis    Essential hypertension    Spastic hemiparesis (HCC)    Aphasia due to recent cerebral infarction 07/04/2016   Cerebrovascular accident (CVA) (HCC)    Altered mental status 06/30/2016   Fall 06/30/2016   Acute encephalopathy 06/30/2016   Diarrhea 06/30/2016   Acute renal failure  superimposed on stage 2 chronic kidney disease (HCC) 06/30/2016   Hemiparesis affecting right side as late effect of stroke (HCC) 10/23/2015   PAD (peripheral artery disease) (HCC) 10/20/2011   Onychomycosis of great toe 09/10/2011   Hyperlipidemia 02/21/2010   CEREBROVASCULAR ACCIDENT, HX OF 12/22/2008   T2DM (type 2 diabetes mellitus) (HCC) 07/30/2006   OBESITY, NOS 07/30/2006   Essential hypertension, benign 07/30/2006    Orientation RESPIRATION BLADDER Height & Weight     Self, Situation, Place  Normal Indwelling catheter Weight: 197 lb 5 oz (89.5 kg) Height:  5\' 7"  (170.2 cm)  BEHAVIORAL SYMPTOMS/MOOD NEUROLOGICAL BOWEL NUTRITION STATUS      Continent Diet (see d/c summary)  AMBULATORY STATUS COMMUNICATION OF NEEDS Skin   Extensive Assist Verbally Normal                       Personal Care Assistance Level of Assistance  Bathing, Feeding, Dressing Bathing Assistance: Limited assistance Feeding assistance: Independent Dressing Assistance: Limited assistance     Functional Limitations Info  Sight, Speech, Hearing Sight Info: Adequate Hearing Info: Adequate Speech Info: Adequate    SPECIAL CARE FACTORS FREQUENCY  PT (By licensed PT), OT (By licensed OT)     PT Frequency: 5x/ week OT Frequency: 5x/ week            Contractures Contractures Info: Not present    Additional Factors Info  Insulin Sliding Scale, Psychotropic, Allergies, Code Status Code Status Info: Full Allergies Info: Pork Allergy  Shrimp Flavor Psychotropic Info: sertraline (ZOLOFT) tablet 100 mg Insulin Sliding  Scale Info: see d/c medication list       Current Medications (09/30/2022):  This is the current hospital active medication list Current Facility-Administered Medications  Medication Dose Route Frequency Provider Last Rate Last Admin   abiraterone acetate (ZYTIGA) tablet 1,000 mg-Pt's Own Supply  1,000 mg Oral Daily Erick Alley, DO   1,000 mg at 09/30/22 1478   amLODipine  (NORVASC) tablet 10 mg  10 mg Oral Daily Tiffany Kocher, DO   10 mg at 09/30/22 0830   atorvastatin (LIPITOR) tablet 40 mg  40 mg Oral Daily Tiffany Kocher, DO   40 mg at 09/30/22 0830   carvedilol (COREG) tablet 25 mg  25 mg Oral BID WC Tiffany Kocher, DO   25 mg at 09/30/22 0830   cefTRIAXone (ROCEPHIN) 1 g in sodium chloride 0.9 % 100 mL IVPB  1 g Intravenous Q24H Carrion-Carrero, Margely, MD 200 mL/hr at 09/29/22 2311 1 g at 09/29/22 2311   Chlorhexidine Gluconate Cloth 2 % PADS 6 each  6 each Topical Daily Nestor Ramp, MD   6 each at 09/30/22 2956   clopidogrel (PLAVIX) tablet 75 mg  75 mg Oral Daily Tiffany Kocher, DO   75 mg at 09/30/22 2130   empagliflozin (JARDIANCE) tablet 10 mg  10 mg Oral Daily Tiffany Kocher, DO   10 mg at 09/30/22 0829   enoxaparin (LOVENOX) injection 40 mg  40 mg Subcutaneous Q24H Tiffany Kocher, DO   40 mg at 09/30/22 0827   insulin aspart (novoLOG) injection 0-9 Units  0-9 Units Subcutaneous TID WC Evette Georges, MD   3 Units at 09/30/22 1139   predniSONE (DELTASONE) tablet 5 mg  5 mg Oral Q breakfast Nestor Ramp, MD       sertraline (ZOLOFT) tablet 100 mg  100 mg Oral Daily Tiffany Kocher, DO   100 mg at 09/30/22 8657   tamsulosin (FLOMAX) capsule 0.8 mg  0.8 mg Oral Daily Tiffany Kocher, DO   0.8 mg at 09/30/22 8469     Discharge Medications: Please see discharge summary for a list of discharge medications.  Relevant Imaging Results:  Relevant Lab Results:   Additional Information SSN-[id number],  Catalina Pizza Nyeemah Jennette, LCSWA

## 2022-09-30 NOTE — TOC Initial Note (Signed)
Transition of Care [facility]) - Initial/Assessment Note    Patient Details  Name: [name] MRN: [id number] Date of Birth: 10-25-1950  Transition of Care [facility]) CM/SW Contact:    Ralene Bathe, LCSWA Phone Number: 09/30/2022, 11:38 AM  Clinical Narrative:                 CSW received consult for possible SNF placement at time of discharge. CSW spoke with patient.  Patient expressed understanding of PT recommendation and is undecided on whether he would like SNF placement at time of discharge, but was agreeable to LCSW sending out referral. Patient reports preference for a facility that will be covered by his insurance. CSW discussed insurance authorization process and will provide Medicare SNF ratings list. CSW will send out referrals for review and provide bed offers as available.   Skilled Nursing Rehab Facilities-   [email or web address]   Ratings out of 5 stars (5 the highest)   Name Address  Phone # Quality Care Staffing Health Inspection Overall  [facility] [address] [phone] 1 1 5 3   [facility] [address], [place] [phone] 4 1 3 2   [facility] [address], [place]inette Otto [phone] 3 1 1 1   [facility] [address], [place] [phone] 3 1 3 2   [facility]  [address], [place] [phone] 4 2 5 5   [facility] [address] [facility] [phone] 2 1 2 1   [facility] [address], [place] [phone] 3 1 2 1   [facility] [address] [place] [phone] 3 4 3 3   [address] ([place]) [address] [place] [phone] 2 2 2 2   [facility] [address] [place], [place] [phone] 1 2 1 1   [facility] ([place]) [address], [place] [phone] 3 1 1 1   [facility] [address] Liliane Shi [phone] 5 2 4 5   [facility] [address] [place]  [phone] 4 4 3 3           [facility] [address] [place] [phone]      [facility], [place] [address], [place] [phone] 1 1 2 1   [facility] [address] [employer] [phone] 2 2 4 4   [facility] [address] [phone] 2 1 4 3   [facility] [address], [place] [phone] 3 3 3 3           [address] (no [facility]) [address], [place] [phone] 4 4 5 5   [facility] (No [employer]) [address] Lavera Guise [phone] 3 1 4 3   [facility] [address] [place] [phone] 2 1 2 1   [facility] (No [employer]) [address], [place] [phone] 5 5 5 5   [facility] [address] [facility] [phone] 2 3 5 5   [facility] [address] [place] [phone] 2 1 1 1   [facility] [address] Liliane Shi [phone] 5 2 5 5   [facility]  [address] [place] [phone] 2 2 2 2   [facility] [address], [place] [phone] 3 2 1 1   [facility] [address] [place], [place] [phone] 2 2 3 3           [facility] [address] [place] [phone] 1 1 1 1   Renelda Mom [address], Evlyn Clines  [phone] 2 3 4 4   [facility] (No  Humana) [address], [place] [phone] 2 2 3 3   [facility]) [address], Rosalita Levan [phone] 1 1 1 1   [facility] [address] [place] Levan [phone] 2 2 5 5   [facility] [address] [place] [address], [place] [phone] 2 1 1 1           [facility] [address] [place], [place] [phone] 5 4 5 5   [facility])  [address], [place] [phone] 2 1 2 1   [facility]) [address] [place] [phone]  2 4 4   [facility] [address] [place] [phone] 3 4 4 4   [address] [place], [place] [phone] 2 2 2 2   [facility])  [address] [place] [phone] 2 1 3 2     Expected Discharge Plan: Skilled Nursing Facility Barriers to Discharge: Continued Medical Work up, English as a second language teacher, SNF Pending bed offer   Patient Goals and CMS Choice   CMS Medicare.gov Compare Post Acute Care list provided to:: Patient Choice offered to / list presented to : Patient      Expected Discharge Plan and Services In-house Referral: Clinical Social Work     Living arrangements for the past 2 months: Single Family Home                                      Prior Living Arrangements/Services Living arrangements for the past 2 months: Single Family Home Lives with:: Self Patient language and need for interpreter reviewed:: Yes Do you feel safe going back to the place where you live?: Yes      Need for Family Participation in Patient Care: Yes (Comment) Care giver support system in place?: Yes (comment)   Criminal Activity/Legal Involvement Pertinent to Current Situation/Hospitalization: No - Comment as needed  Activities of Daily Living Home Assistive Devices/Equipment: Walker (specify type) ADL Screening (condition at time of admission) Patient's cognitive ability adequate to safely complete daily activities?: Yes Is the patient deaf or have difficulty hearing?: No Does the patient have difficulty seeing, even when wearing glasses/contacts?: No Does the patient have difficulty concentrating, remembering, or making decisions?: No Patient able to express need for assistance with ADLs?: Yes Does the patient have difficulty dressing or bathing?: Yes Independently performs ADLs?: No Communication: Independent Does the patient have difficulty walking or climbing stairs?: Yes Weakness of Legs: Both Weakness of Arms/Hands: None  Permission Sought/Granted   Permission granted to share information with : Yes, Verbal Permission Granted     Permission granted to share info w AGENCY: SNFs         Emotional Assessment Appearance:: Appears stated age Attitude/Demeanor/Rapport: Engaged Affect (typically observed): Appropriate Orientation: : Oriented to Place, Oriented to Self, Oriented to Situation Alcohol / Substance Use: Not Applicable Psych Involvement: No (comment)  Admission diagnosis:  Elevated blood pressure reading [R03.0] Weakness [R53.1] Acute cystitis with hematuria [N30.01] Abdominal pain [R10.9] Complication of Foley catheter, initial encounter (HCC) [T55.9XXA] Patient Active Problem List   Diagnosis Date Noted   Abdominal pain 09/29/2022   Urinary tract infection 09/29/2022   Elevated serum creatinine 09/29/2022   Prostate cancer (HCC) 09/29/2022   Weakness 09/29/2022   Complication of Foley catheter (HCC) 09/29/2022   Multiple falls 08/02/2022   Paronychia of great toe 03/05/2022   Hydronephrosis    Acute hyperkalemia  Obstructive uropathy 11/15/2021   Metabolic acidosis, increased anion gap 11/15/2021   Pleural effusion 11/15/2021   Acute renal failure (ARF) (HCC) 11/15/2021   Pain due to onychomycosis of toenails of both feet 12/19/2020   Physical deconditioning 11/27/2020   Dysequilibrium 11/27/2020   Spastic hemiplegia of right dominant side due to cerebrovascular disease (HCC) 11/03/2017   Muscle wasting 09/25/2017   Depression 07/14/2017   Dysarthria as late effect of stroke 08/18/2016   Stage 2 chronic kidney disease    Leukocytosis    Essential hypertension    Spastic hemiparesis (HCC)    Aphasia due to recent cerebral infarction 07/04/2016   Cerebrovascular accident (CVA) (HCC)    Altered mental status 06/30/2016   Fall 06/30/2016   Acute encephalopathy 06/30/2016   Diarrhea 06/30/2016   Acute renal failure superimposed on stage 2 chronic kidney disease (HCC) 06/30/2016   Hemiparesis affecting right side as late effect of stroke (HCC) 10/23/2015   PAD (peripheral artery disease) (HCC) 10/20/2011   Onychomycosis of great toe  09/10/2011   Hyperlipidemia 02/21/2010   CEREBROVASCULAR ACCIDENT, HX OF 12/22/2008   T2DM (type 2 diabetes mellitus) (HCC) 07/30/2006   OBESITY, NOS 07/30/2006   Essential hypertension, benign 07/30/2006   PCP:  Reece Leader, DO Pharmacy:   [id number] [place] [place] - [address] AT [facility] [address] [place] [address] Phone: [phone] Fax: [phone]     Social Determinants of Health (SDOH) Social History: SDOH Screenings   Food Insecurity: No Food Insecurity (09/29/2022)  Housing: Low Risk  (09/29/2022)  Transportation Needs: No Transportation Needs (09/29/2022)  Utilities: Not At Risk (09/29/2022)  Depression (PHQ2-9): Low Risk  (03/25/2021)  Tobacco Use: Low Risk  (09/28/2022)   SDOH Interventions:     Readmission Risk Interventions     No data to display

## 2022-09-30 NOTE — Consult Note (Signed)
Consultation Note Date: 09/30/2022   Patient Name: [name]  DOB: 08-16-1950  MRN: [id number]  Age / Sex: 72 y.o., male  PCP: Kyle Leader, DO Referring Physician: Nestor Ramp, MD  Reason for Consultation: Establishing goals of care  HPI/Patient Profile: 72 y.o. male  admitted on 09/28/2022 with abdominal pain and dislodged Foley catheter.   At time of admission, abdominal pain had resolved.  Suspect abdominal pain 2/2 distended bladder (drained ~1200 ml once Foley replaced, and resolved pain).  Low suspicion for pancreatitis (lipase 53, no nausea or vomiting), low suspicion for liver/GB (no localizing findings, mildly elevated ALT).    No epigastric pain, NVD, nor symptoms of GERD, lower suspicion for PUD and gastroenteritis. 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  Received ceftriaxone in ED, pending urine cultures.   Plan to admit for observation.    Patient lives alone, unfortunately presented to ED covered in feces.  Question his ability to care for himself, will obtain PT/OT evaluation.  However patient and family have declined SNF in the past. Patient and family face treatment option decisions, advanced directive decisions and anticipatory care needs        Clinical Assessment and Goals of Care:  This NP [name] reviewed medical records, received report from team, assessed the patient and then meet at the patient's bedside  to discuss diagnosis, prognosis, GOC, EOL wishes disposition and options.    I the  spoke to his ex-wife/ [name] by telephone   Concept of Palliative Care was introduced as specialized medical care for people and their families living with serious illness.  If focuses on providing relief from the symptoms and stress of a serious illness.  The goal  is to improve quality of life for both the patient and the family.  Values and goals of care important to patient and family were attempted to be elicited.  Created space and opportunity for patient to explore thoughts and feelings regarding current medical situation.  Patient himself does recognize that it would be helpful to have more help in the home for him.  He wants to avoid skilled nursing if at all possible.  Ex-wife [name] voices the same concerns.  Family expressed that patient has had stays at skilled nursing facilities in the past and it has been a bad experience for him.  Education offered on limited options for in-home care outside family or  out-of-pocket caregivers.  Will discuss with transition of care team to investigate any possible options for [name]     Concepts specific to code status, artifical feeding and hydration, continued IV antibiotics and rehospitalization was had.         Patient remains a full code       Questions and concerns addressed.  Patient  encouraged to call with questions or concerns.     PMT will continue to support holistically.  No documented H POA or advanced care planning documents.  Encourage patient to consider discussion with family and documentation of these forms       SUMMARY OF RECOMMENDATIONS    Code Status/Advance Care Planning: Full code    Palliative Prophylaxis:  Aspiration, Bowel Regimen, Delirium Protocol, Frequent Pain Assessment, and Oral Care  Additional Recommendations (Limitations, Scope, Preferences): Full Scope Treatment  Psycho-social/Spiritual:  Desire for further Chaplaincy support:no Additional Recommendations: Emotional support offered  Prognosis:  Unable to determine  Discharge Planning: To Be Determined      Primary Diagnoses: Present on Admission:  Abdominal pain  Essential hypertension   I have reviewed the medical record, interviewed the patient and family, and examined  the patient. The following aspects are pertinent.  Past Medical History:  Diagnosis Date   CVA (cerebral infarction) 01/03/2008   MRI: Acute 1 x 1.5 cm infarction affecting the left side of the pons.   Diabetes mellitus    DVT (deep venous thrombosis) (HCC)    Hypertension    PAD (peripheral artery disease) (HCC)    Stroke (HCC)    2008   Social History   Socioeconomic History   Marital status: Divorced    Spouse name: Not on file   Number of children: 3   Years of education: 14   Highest education level: Not on file  Occupational History   Occupation: Disabled-security guard    Employer: [employer]  Tobacco Use   Smoking status: Never   Smokeless tobacco: Never  Substance and Sexual Activity   Alcohol use: No   Drug use: No   Sexual activity: Never    Partners: Female  Other Topics Concern   Not on file  Social History Narrative   Divorced, lives alone   2 sons, 1 daughter   Retired/disabled   No caffeine   12/24/2015   Social Determinants of Health   Financial Resource Strain: Not on file  Food Insecurity: No Food Insecurity (09/29/2022)   Hunger Vital Sign    Worried About Running Out of Food in the Last Year: Never true    Ran Out of Food in the Last Year: Never true  Transportation Needs: No Transportation Needs (09/29/2022)   PRAPARE - Administrator, Civil Service (Medical): No    Lack of Transportation (Non-Medical): No  Physical Activity: Not on file  Stress: Not on file  Social Connections: Not on file   Family History  Problem Relation Age of Onset   Diabetes Mother    Heart attack Mother    Hypertension Mother    Diabetes Sister    Hypertension Sister    Stroke Sister        2017   Hypertension Brother    Hypertension Daughter    Hypertension Son    Colon cancer Neg Hx    Esophageal cancer Neg Hx    Stomach cancer Neg Hx    Rectal cancer Neg Hx    Scheduled Meds:  abiraterone acetate  1,000 mg Oral Daily    amLODipine  10 mg Oral Daily   atorvastatin  40 mg Oral Daily   carvedilol  25 mg Oral BID WC   Chlorhexidine Gluconate Cloth  6 each Topical Daily   clopidogrel  75 mg Oral Daily   empagliflozin  10 mg Oral Daily   enoxaparin (LOVENOX) injection  40 mg Subcutaneous Q24H   insulin aspart  0-9 Units Subcutaneous TID WC   predniSONE  5 mg  Oral Q breakfast   sertraline  100 mg Oral Daily   tamsulosin  0.8 mg Oral Daily   Continuous Infusions:  cefTRIAXone (ROCEPHIN)  IV 1 g (09/29/22 2311)   PRN Meds:. Medications Prior to Admission:  Prior to Admission medications   Medication Sig Start Date End Date Taking? Authorizing Provider  abiraterone acetate (ZYTIGA) 250 MG tablet Take 1,000 mg by mouth daily. 09/12/22  Yes [provider]  amLODipine (NORVASC) 5 MG tablet Take 2 tablets (10 mg total) by mouth daily. Patient taking differently: Take 10 mg by mouth in the morning and at bedtime. 08/04/22  Yes Ganta, Anupa, DO  atorvastatin (LIPITOR) 40 MG tablet Take 1 tablet (40 mg total) by mouth daily. 08/04/22  Yes Ganta, Anupa, DO  carvedilol (COREG) 25 MG tablet TAKE 1 TABLET BY MOUTH TWICE A DAY WITH MEALS Patient taking differently: Take 25 mg by mouth 2 (two) times daily with a meal. 12/04/21  Yes Ganta, Anupa, DO  clopidogrel (PLAVIX) 75 MG tablet Take 1 tablet (75 mg total) by mouth daily. 08/04/22  Yes Ganta, Anupa, DO  empagliflozin (JARDIANCE) 10 MG TABS tablet Take 1 tablet (10 mg total) by mouth daily. 11/26/21  Yes Maury Dus, MD  metFORMIN (GLUCOPHAGE) 1000 MG tablet Take 1 tablet (1,000 mg total) by mouth 2 (two) times daily with a meal. 08/04/22  Yes Ganta, Anupa, DO  predniSONE (DELTASONE) 5 MG tablet Take 5 mg by mouth daily. 09/12/22  Yes [provider]  sertraline (ZOLOFT) 100 MG tablet TAKE 1 TABLET BY MOUTH EVERY DAY Patient taking differently: Take 100 mg by mouth daily. 12/26/21  Yes Ganta, Anupa, DO  tamsulosin (FLOMAX) 0.4 MG CAPS capsule Take 2 capsules  (0.8 mg total) by mouth daily. 08/04/22  Yes Ganta, Anupa, DO  Blood Gluc Meter Disp-Strips (SIDEKICK BLOOD GLUCOSE SYSTEM) DEVI Use for daily testing Patient not taking: Reported on 01/22/2022 08/06/16   Sharon Seller, NP  Blood Pressure Monitor DEVI 1 Units by Does not apply route daily. Check  blood pressure once daily, record readings, bring to doctor's office. Patient not taking: Reported on 01/22/2022 11/27/20   Peggyann Shoals C, DO  Insulin Pen Needle (B-D ULTRAFINE III SHORT PEN) 31G X 8 MM MISC USE WITH LEVEMIR 08/06/16   Sharon Seller, NP   Allergies  Allergen Reactions   Pork Allergy    Shrimp Flavor Other (See Comments)    Doesn't like it   Review of Systems  Neurological:  Positive for weakness.    Physical Exam Constitutional:      Appearance: He is normal weight. He is ill-appearing.  Cardiovascular:     Rate and Rhythm: Bradycardia present.  Musculoskeletal:     Comments: Generalized weakness and muscle atrophy   Neurological:     Mental Status: He is alert.     Vital Signs: BP (!) 186/57 (BP Location: Right Arm)   Pulse (!) 55   Temp 98.5 F (36.9 C) (Oral)   Resp 13   Ht 5\' 7"  (1.702 m)   Wt 89.5 kg   SpO2 100%   BMI 30.90 kg/m  Pain Scale: 0-10   Pain Score: Asleep   SpO2: SpO2: 100 % O2 Device:SpO2: 100 % O2 Flow Rate: .   IO: Intake/output summary:  Intake/Output Summary (Last 24 hours) at 09/30/2022 0852 Last data filed at 09/30/2022 1610 Gross per 24 hour  Intake 1960 ml  Output 1800 ml  Net 160 ml    LBM: Last  BM Date : 09/28/22 Baseline Weight: Weight: 93 kg Most recent weight: Weight: 89.5 kg     Palliative Assessment/Data: 40%    Time: 90 minutes   Signed by: Kyle Creed, NP   Please contact Palliative Medicine Team phone at [phone] for questions and concerns.  For individual provider: See Loretha Stapler

## 2022-09-30 NOTE — Progress Notes (Addendum)
Daily Progress Note Intern Pager: [phone]  Patient name: [name] Medical record number: [id number] Date of birth: 1951-04-21 Age: 72 y.o. Gender: male  Primary Care Provider: Reece Leader, DO Consultants: None Code Status: FULL  Pt Overview and Major Events to Date:  4/28 - Admission 4/29- palliative consult placed for goals of care discussion  Assessment and Plan: DELONTAE LAMM is a 72 y.o. male presenting with abdominal pain and dislodged Foley catheter. . 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  * Abdominal pain Asymptomatic today. Having adequate urine output, diuresed 1.1 L today.  WBC a 11.9 > 8.9>8.5. CEA downtrending reports normal stools, consider adding bowel regimen if patient endorses constipation. If patient develops localizing findings or infectious symptoms, will consider imaging and obtaining blood cultures. - Monitor urinary output - Continue Flomax - AM CBC, BMP  Prostate cancer (HCC) History of obstructive uropathy in the setting of prostate cancer currently being treated with Zytiga/prednisone combination pill.  Family has brought meds to bedside.  -F/u palliative recs  Elevated serum creatinine Creatinine 1.36 on admission, trending up  1.16>1.43 this morning baseline ~1.1-1.2. Demonstrates adequate PO fluid intake, request an additional cup of water during evaluation.  - CTM  Urinary tract infection Initially presenting for dislodged chronic indwelling foley 2/2 prostamegaly from prostate cancer. UCx showing Gram negative rod colonies. Continuing IV Ceftriaxone today for total of 3 days. Will likely discharge tomorrow. - Ceftriaxone 4/29-5/1  Essential hypertension Remains hypertensive ranging in the 160s-low 180s, on admission without signs of endorgan damage.  Home HTN meds restarted on admission.  - Amlodipine 10 mg daily - Carvedilol 25 mg twice daily with meals - CTM  T2DM (type  2 diabetes mellitus) (HCC) A1c 9.7%.  Patient initially restarted on home Jardiance for management of his elevated CBGs.  Sliding scale has been included today as his CBGs are trending up to the 450s. -Continue Jardiance -Start sSSI, can advance as required. Goal CBGs <180     FEN/GI: Regular PPx: Lovenox 40 mg [place] Dispo:Pending PT recommendations  pending clinical improvement .  Subjective:  Patient at bedside, reports adequate urine output.  Declined SNF placement, demonstrates capacity and insight when discussing his current limitations.  He understands he has limitations caring for himself, wishes to discharge home regardless.   Objective: Temp:  [98.5 F (36.9 C)-99.4 F (37.4 C)] 98.6 F (37 C) (04/30 0859) Pulse Rate:  [55-69] 69 (04/30 0859) Resp:  [13-18] 18 (04/30 0859) BP: (143-186)/(57-73) 148/63 (04/30 0859) SpO2:  [99 %-100 %] 100 % (04/30 0859) Weight:  [89.5 kg] 89.5 kg (04/29 2048) Physical Exam: General: Chronically ill gentleman, pleasant, warm and cooperative Cardiovascular: RRR, no M/R/G Respiratory: Normal work of breathing on room air, lung sounds clear anteriorly Abdomen: Nontender, nondistended, normal active bowel sounds Extremities: ROM normal, right-sided deficits not out of proportion to previous CVA.  Laboratory: Most recent CBC Lab Results  Component Value Date   WBC 8.5 09/30/2022   HGB 10.7 (L) 09/30/2022   HCT 32.3 (L) 09/30/2022   MCV 86.8 09/30/2022   PLT 133 (L) 09/30/2022   Most recent BMP    Latest Ref Rng & Units 09/30/2022    1:54 AM  BMP  Glucose 70 - 99 mg/dL 147   BUN 8 - 23 mg/dL 30   Creatinine 8.29 - 1.24 mg/dL 5.62   Sodium 130 - 865 mmol/L 137   Potassium 3.5 - 5.1 mmol/L 3.8  Chloride 98 - 111 mmol/L 103   CO2 22 - 32 mmol/L 25   Calcium 8.9 - 10.3 mg/dL 8.6     Other pertinent labs : A1c 9.7%  Imaging/Diagnostic Tests: No imaging studies today   Lorri Frederick, MD 09/30/2022, 2:22 PM PGY-1,  [facility] FPTS Intern pager: [phone], text pages welcome Secure chat group [facility] Teaching Service

## 2022-10-01 DIAGNOSIS — R531 Weakness: Secondary | ICD-10-CM | POA: Diagnosis not present

## 2022-10-01 DIAGNOSIS — Z515 Encounter for palliative care: Secondary | ICD-10-CM | POA: Diagnosis not present

## 2022-10-01 DIAGNOSIS — Z7189 Other specified counseling: Secondary | ICD-10-CM | POA: Diagnosis not present

## 2022-10-01 DIAGNOSIS — N3001 Acute cystitis with hematuria: Secondary | ICD-10-CM | POA: Diagnosis not present

## 2022-10-01 LAB — GLUCOSE, CAPILLARY
Glucose-Capillary: 261 mg/dL — ABNORMAL HIGH (ref 70–99)
Glucose-Capillary: 253 mg/dL — ABNORMAL HIGH (ref 70–99)
Glucose-Capillary: 357 mg/dL — ABNORMAL HIGH (ref 70–99)
Glucose-Capillary: 300 mg/dL — ABNORMAL HIGH (ref 70–99)

## 2022-10-01 LAB — URINE CULTURE: Culture: >=100000 — AB

## 2022-10-01 MED ORDER — INSULIN GLARGINE-YFGN 100 UNIT/ML ~~LOC~~ SOLN
5.0000 [IU] | Freq: Every day | SUBCUTANEOUS | Status: DC
  Administered 2022-10-01: 5 [IU] via SUBCUTANEOUS
  Filled 2022-10-01 (×2): qty 0.05

## 2022-10-01 MED ORDER — INSULIN ASPART 100 UNIT/ML IJ SOLN
9.0000 [IU] | Freq: Once | INTRAMUSCULAR | Status: AC
  Administered 2022-10-01: 9 [IU] via SUBCUTANEOUS

## 2022-10-01 NOTE — Progress Notes (Signed)
     Daily Progress Note Intern Pager: [phone]  Patient name: [name] Medical record number: [id number] Date of birth: [birth year] Age: 72 y.o. Gender: male  Primary Care Provider: Reece Leader, DO Consultants: none Code Status: Full  Pt Overview and Major Events to Date:  4/28 - Admission 4/29- palliative consult placed for goals of care discussion  Assessment and Plan: [name] is a 72 y.o. male presenting with abdominal pain and dislodged Foley catheter. . 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   * Abdominal pain Most likely 2/2 urinary retention from foley malfunction. Abm exam benign today. - Monitor urinary output - Continue Flomax - AM CBC, BMP  T2DM (type 2 diabetes mellitus) (HCC) Using ~10u SSI yesterday. Considering adding 10u long acting, but holding off given we are unsure if pt is discharging today or tomorrow. Pt likely needs additional diabetes control on top of home jardiance and metformin given elevated A1c on admission. If pt is d/c to SNF, they can help him titrate insulin needs.  -Continue Jardiance -Cont sSSI, can advance as required. Goal CBGs <180  Prostate cancer (HCC) History of obstructive uropathy in the setting of prostate cancer currently being treated with Zytiga/prednisone combination pill. - F/u palliative recs - Urology f/u outpt  Elevated serum creatinine Uptrending today. Reassuringly, pt has good UOP. - AM BMP  Essential hypertension SBP elevated, but DBP at goal. Will hold further antiHTN titration given c/f hypotension. - Amlodipine 10 mg daily - Carvedilol 25 mg twice daily with meals - CTM  Urinary tract infection-resolved as of 10/01/2022 Unclear if true UTI vs chronic colonization. Pt empirically treated and will finish 3day CTX today. - Ceftriaxone 4/29-5/1   FEN/GI: Dysphagia 3 PPx: Lovenox Dispo:SNF tomorrow. Barriers include awaiting SNF placement.    Subjective:  Spoke to pt to aboutgoing to home vs going to SNF. Pt reports that he had a poor experience at his prior SNF, but he is open to going to a different SNF if we can find one.   Objective: Temp:  [98.3 F (36.8 C)-98.6 F (37 C)] 98.3 F (36.8 C) (05/01 0913) Pulse Rate:  [54-59] 59 (05/01 0913) Resp:  [17-18] 18 (05/01 0913) BP: (138-174)/(60-104) 167/60 (05/01 0913) SpO2:  [97 %-100 %] 97 % (05/01 0913) Physical Exam: General: Alert, pleasant older man laying in bed. NAD.  HEENT: NCAT. MMM. Cardiovascular: RRR, no murmurs Respiratory: CTAB. Normal WOB on RA.  Abdomen: Soft, nontender, nondistended. Normal BS. GU: Foley cath in place   Laboratory: Most recent CBC Lab Results  Component Value Date   WBC 8.5 09/30/2022   HGB 10.7 (L) 09/30/2022   HCT 32.3 (L) 09/30/2022   MCV 86.8 09/30/2022   PLT 133 (L) 09/30/2022   Most recent BMP    Latest Ref Rng & Units 09/30/2022    1:54 AM  BMP  Glucose 70 - 99 mg/dL 147   BUN 8 - 23 mg/dL 30   Creatinine 8.29 - 1.24 mg/dL 5.62   Sodium 130 - 865 mmol/L 137   Potassium 3.5 - 5.1 mmol/L 3.8   Chloride 98 - 111 mmol/L 103   CO2 22 - 32 mmol/L 25   Calcium 8.9 - 10.3 mg/dL 8.6     Kyle Brigham, MD 10/01/2022, 2:40 PM  PGY-1, [facility] Family [facility] FPTS Intern pager: [phone], text pages welcome Secure chat group [facility] Teaching Service

## 2022-10-01 NOTE — TOC Progression Note (Signed)
Transition of Care [facility]) - Initial/Assessment Note    Patient Details  Name: [name] MRN: [id number] Date of Birth: 04-27-1951  [facility] CM/SW Contact:    Ralene Bathe, LCSWA Phone Number: 10/01/2022, 11:24 AM  Clinical Narrative:                 LCSW met with the patient at bedside to present bed offers.  The patient requested that LCSW contact his spouse to give bed offers.  LCSW contacted the patient's ex wife and presented bed offers.  The ex spouse will speak with the patient's daughter and inform LCSW of the outcome.    TOC following.   Expected Discharge Plan: Skilled Nursing Facility Barriers to Discharge: Continued Medical Work up, English as a second language teacher, SNF Pending bed offer   Patient Goals and CMS Choice   CMS Medicare.gov Compare Post Acute Care list provided to:: Patient Choice offered to / list presented to : Patient      Expected Discharge Plan and Services In-house Referral: Clinical Social Work     Living arrangements for the past 2 months: Single Family Home                                      Prior Living Arrangements/Services Living arrangements for the past 2 months: Single Family Home Lives with:: Self Patient language and need for interpreter reviewed:: Yes Do you feel safe going back to the place where you live?: Yes      Need for Family Participation in Patient Care: Yes (Comment) Care giver support system in place?: Yes (comment)   Criminal Activity/Legal Involvement Pertinent to Current Situation/Hospitalization: No - Comment as needed  Activities of Daily Living Home Assistive Devices/Equipment: Walker (specify type) ADL Screening (condition at time of admission) Patient's cognitive ability adequate to safely complete daily activities?: Yes Is the patient deaf or have difficulty hearing?: No Does the patient have difficulty seeing, even when wearing glasses/contacts?: No Does the patient have  difficulty concentrating, remembering, or making decisions?: No Patient able to express need for assistance with ADLs?: Yes Does the patient have difficulty dressing or bathing?: Yes Independently performs ADLs?: No Communication: Independent Does the patient have difficulty walking or climbing stairs?: Yes Weakness of Legs: Both Weakness of Arms/Hands: None  Permission Sought/Granted   Permission granted to share information with : Yes, Verbal Permission Granted     Permission granted to share info w AGENCY: SNFs        Emotional Assessment Appearance:: Appears stated age Attitude/Demeanor/Rapport: Engaged Affect (typically observed): Appropriate Orientation: : Oriented to Place, Oriented to Self, Oriented to Situation Alcohol / Substance Use: Not Applicable Psych Involvement: No (comment)  Admission diagnosis:  Elevated blood pressure reading [R03.0] Weakness [R53.1] Acute cystitis with hematuria [N30.01] Abdominal pain [R10.9] Complication of Foley catheter, initial encounter (HCC) [C16.9XXA] Patient Active Problem List   Diagnosis Date Noted   Abdominal pain 09/29/2022   Urinary tract infection 09/29/2022   Elevated serum creatinine 09/29/2022   Prostate cancer (HCC) 09/29/2022   Weakness 09/29/2022   Complication of Foley catheter (HCC) 09/29/2022   Multiple falls 08/02/2022   Paronychia of great toe 03/05/2022   Hydronephrosis    Acute hyperkalemia    Obstructive uropathy 11/15/2021   Metabolic acidosis, increased anion gap 11/15/2021   Pleural effusion 11/15/2021   Acute renal failure (ARF) (HCC) 11/15/2021   Pain due to onychomycosis  of toenails of both feet 12/19/2020   Physical deconditioning 11/27/2020   Dysequilibrium 11/27/2020   Spastic hemiplegia of right dominant side due to cerebrovascular disease (HCC) 11/03/2017   Muscle wasting 09/25/2017   Depression 07/14/2017   Dysarthria as late effect of stroke 08/18/2016   Stage 2 chronic kidney disease     Leukocytosis    Essential hypertension    Spastic hemiparesis (HCC)    Aphasia due to recent cerebral infarction 07/04/2016   Cerebrovascular accident (CVA) (HCC)    Altered mental status 06/30/2016   Fall 06/30/2016   Acute encephalopathy 06/30/2016   Diarrhea 06/30/2016   Acute renal failure superimposed on stage 2 chronic kidney disease (HCC) 06/30/2016   Hemiparesis affecting right side as late effect of stroke (HCC) 10/23/2015   PAD (peripheral artery disease) (HCC) 10/20/2011   Onychomycosis of great toe 09/10/2011   Hyperlipidemia 02/21/2010   CEREBROVASCULAR ACCIDENT, HX OF 12/22/2008   T2DM (type 2 diabetes mellitus) (HCC) 07/30/2006   OBESITY, NOS 07/30/2006   Essential hypertension, benign 07/30/2006   PCP:  Reece Leader, DO Pharmacy:   [id number] - [place], [place] - [address] AT [facility] [address] [place] [address] Phone: [phone] Fax: [phone]     Social Determinants of Health (SDOH) Social History: SDOH Screenings   Food Insecurity: No Food Insecurity (09/29/2022)  Housing: Low Risk  (09/29/2022)  Transportation Needs: No Transportation Needs (09/29/2022)  Utilities: Not At Risk (09/29/2022)  Depression (PHQ2-9): Low Risk  (03/25/2021)  Tobacco Use: Low Risk  (09/28/2022)   SDOH Interventions:     Readmission Risk Interventions     No data to display

## 2022-10-01 NOTE — Progress Notes (Signed)
Patient ID: [name], male   DOB: 23-Mar-1951, 72 y.o.   MRN: [id number]    Progress Note from the Palliative Medicine Team at [facility]   Patient Name: [name]        Date: 10/01/2022 DOB: 1950-09-25  Age: 72 y.o. MRN#: [id number] Attending Physician: Nestor Ramp, MD Primary Care Physician: Reece Leader, DO Admit Date: 09/28/2022   Medical records reviewed   72 y.o. male  admitted on 09/28/2022 with abdominal pain and dislodged Foley catheter.   At time of admission, abdominal pain had resolved.  Suspect abdominal pain 2/2 distended bladder (drained ~1200 ml once Foley replaced, and resolved pain).  Low suspicion for pancreatitis (lipase 53, no nausea or vomiting), low suspicion for liver/GB (no localizing findings, mildly elevated ALT).     No epigastric pain, NVD, nor symptoms of GERD, lower suspicion for PUD and gastroenteritis. 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  Received ceftriaxone in ED, pending urine cultures.   Plan to admit for observation.     Patient lives alone, unfortunately presented to ED covered in feces.  Question his ability to care for himself, will obtain PT/OT evaluation.  However patient and family have declined SNF in the past. Patient and family face treatment option decisions, advanced directive decisions and anticipatory care needs       This NP assessed patient at the bedside as a follow up to  yesterday's GOCs meeting and to assess for palliative medicine needs and emotional support.  Patient verbalizes an understanding of his need for ongoing rehabilitation at time of discharge.  Mr. Blowers is open to SNF for short-term rehab when medically stable.  Education offered today regarding  the importance of continued conversation with family and their  medical providers regarding overall plan of care and treatment options,  ensuring  decisions are within the context of the patients values and GOCs.  Education offered on the importance of establishing H POA and documentation of advance care planning wishes.  Patient is contemplating and I offered to assist him with documentation when he was ready.  He will let me know.  Questions and concerns addressed      Time: 35 minutes  Detailed review of medical records ( labs, imaging, vital signs), medically appropriate exam ( MS, skin, resp)   discussed with treatment team, counseling and education to patient, family, staff, documenting clinical information, medication management, coordination of care    Lorinda Creed NP  Palliative Medicine Team Team Phone # [phone] Pager [phone]

## 2022-10-01 NOTE — Inpatient Diabetes Management (Signed)
Inpatient Diabetes Program Recommendations  AACE/ADA: New Consensus Statement on Inpatient Glycemic Control (2015)  Target Ranges:  Prepandial:   less than 140 mg/dL      Peak postprandial:   less than 180 mg/dL (1-2 hours)      Critically ill patients:  140 - 180 mg/dL   Lab Results  Component Value Date   GLUCAP 261 (H) 10/01/2022   HGBA1C 9.7 (H) 09/29/2022    Review of Glycemic Control  Latest Reference Range & Units 09/30/22 11:16 09/30/22 16:08 09/30/22 20:54 10/01/22 07:44  Glucose-Capillary 70 - 99 mg/dL 161 (H) 096 (H) 045 (H) 261 (H)  (H): Data is abnormally high  Diabetes history: DM2 Outpatient Diabetes medications: Jardiance 10 mg QD, Metformin 1000 mg BID Current orders for Inpatient glycemic control: Novolog 0-9 units TID, Jardiance 10 mg QD, Prednisone 5 mg   Inpatient Diabetes Program Recommendations:    Might consider small dose of basal while on steroids:  Semglee 10 units QD  Will continue to follow while inpatient.  Thank you, Dulce Sellar, MSN, CDCES Diabetes Coordinator Inpatient Diabetes Program [phone] (team pager from 8a-5p)

## 2022-10-02 DIAGNOSIS — T839XXD Unspecified complication of genitourinary prosthetic device, implant and graft, subsequent encounter: Secondary | ICD-10-CM

## 2022-10-02 LAB — BASIC METABOLIC PANEL
Anion gap: 8 (ref 5–15)
BUN: 29 mg/dL — ABNORMAL HIGH (ref 8–23)
CO2: 27 mmol/L (ref 22–32)
Calcium: 8.7 mg/dL — ABNORMAL LOW (ref 8.9–10.3)
Chloride: 103 mmol/L (ref 98–111)
Creatinine, Ser: 1.24 mg/dL (ref 0.61–1.24)
GFR, Estimated: >60 mL/min (ref >60)
Glucose, Bld: 93 mg/dL (ref 70–99)
Potassium: 3.6 mmol/L (ref 3.5–5.1)
Sodium: 138 mmol/L (ref 135–145)

## 2022-10-02 LAB — GLUCOSE, CAPILLARY
Glucose-Capillary: 133 mg/dL — ABNORMAL HIGH (ref 70–99)
Glucose-Capillary: 302 mg/dL — ABNORMAL HIGH (ref 70–99)
Glucose-Capillary: 315 mg/dL — ABNORMAL HIGH (ref 70–99)
Glucose-Capillary: 579 mg/dL (ref 70–99)
Glucose-Capillary: 175 mg/dL — ABNORMAL HIGH (ref 70–99)

## 2022-10-02 MED ORDER — INSULIN ASPART 100 UNIT/ML IJ SOLN
0.0000 [IU] | Freq: Three times a day (TID) | INTRAMUSCULAR | Status: DC
  Administered 2022-10-02: 11 [IU] via SUBCUTANEOUS
  Administered 2022-10-02: 12 [IU] via SUBCUTANEOUS
  Administered 2022-10-03 (×2): 5 [IU] via SUBCUTANEOUS
  Administered 2022-10-04: 11 [IU] via SUBCUTANEOUS
  Administered 2022-10-04: 3 [IU] via SUBCUTANEOUS
  Administered 2022-10-05: 5 [IU] via SUBCUTANEOUS
  Administered 2022-10-05: 8 [IU] via SUBCUTANEOUS
  Administered 2022-10-05: 5 [IU] via SUBCUTANEOUS
  Administered 2022-10-06: 8 [IU] via SUBCUTANEOUS
  Administered 2022-10-06: 15 [IU] via SUBCUTANEOUS
  Administered 2022-10-07: 3 [IU] via SUBCUTANEOUS
  Administered 2022-10-07 – 2022-10-08 (×3): 5 [IU] via SUBCUTANEOUS
  Administered 2022-10-09: 8 [IU] via SUBCUTANEOUS
  Administered 2022-10-10: 5 [IU] via SUBCUTANEOUS
  Administered 2022-10-10: 2 [IU] via SUBCUTANEOUS
  Administered 2022-10-11: 3 [IU] via SUBCUTANEOUS
  Administered 2022-10-11: 8 [IU] via SUBCUTANEOUS
  Administered 2022-10-12: 3 [IU] via SUBCUTANEOUS
  Administered 2022-10-12: 2 [IU] via SUBCUTANEOUS
  Administered 2022-10-13: 5 [IU] via SUBCUTANEOUS
  Administered 2022-10-13: 2 [IU] via SUBCUTANEOUS

## 2022-10-02 MED ORDER — INSULIN GLARGINE-YFGN 100 UNIT/ML ~~LOC~~ SOLN
10.0000 [IU] | Freq: Every day | SUBCUTANEOUS | Status: DC
  Administered 2022-10-02 – 2022-10-05 (×4): 10 [IU] via SUBCUTANEOUS
  Filled 2022-10-02 (×4): qty 0.1

## 2022-10-02 MED ORDER — INSULIN ASPART 100 UNIT/ML IJ SOLN
3.0000 [IU] | Freq: Three times a day (TID) | INTRAMUSCULAR | Status: DC
  Administered 2022-10-02 – 2022-10-03 (×3): 3 [IU] via SUBCUTANEOUS

## 2022-10-02 MED ORDER — INSULIN ASPART 100 UNIT/ML IJ SOLN
0.0000 [IU] | Freq: Every day | INTRAMUSCULAR | Status: DC
  Administered 2022-10-03: 3 [IU] via SUBCUTANEOUS
  Administered 2022-10-04: 2 [IU] via SUBCUTANEOUS
  Administered 2022-10-05: 3 [IU] via SUBCUTANEOUS
  Administered 2022-10-07 – 2022-10-10 (×3): 2 [IU] via SUBCUTANEOUS

## 2022-10-02 MED ORDER — POLYETHYLENE GLYCOL 3350 17 G PO PACK
17.0000 g | PACK | Freq: Every day | ORAL | Status: DC
  Administered 2022-10-02 – 2022-10-15 (×14): 17 g via ORAL
  Filled 2022-10-02 (×14): qty 1

## 2022-10-02 MED ORDER — ORAL CARE MOUTH RINSE: 15.0000 mL | OROMUCOSAL | Status: DC | PRN

## 2022-10-02 MED ORDER — ORAL CARE MOUTH RINSE
15.0000 mL | OROMUCOSAL | Status: DC
  Administered 2022-10-02 – 2022-10-15 (×41): 15 mL via OROMUCOSAL

## 2022-10-02 MED ORDER — SENNA 8.6 MG PO TABS
1.0000 | ORAL_TABLET | Freq: Every day | ORAL | Status: DC
  Administered 2022-10-02 – 2022-10-15 (×14): 8.6 mg via ORAL
  Filled 2022-10-02 (×14): qty 1

## 2022-10-02 NOTE — TOC Progression Note (Addendum)
Transition of Care [facility]) - Initial/Assessment Note    Patient Details  Name: [name] MRN: [id number] Date of Birth: [birth year]  Transition of Care [facility]) CM/SW Contact:    Ralene Bathe, LCSWA Phone Number: 10/02/2022, 9:16 AM  Clinical Narrative:                 Addendum 1415-  LCSW received a returned call from [facility] with [facility] [place].  The facility can accept the patient and will request insurance authorization for SNF.   Addendum 14:00-  LCSW spoke with the patient's ex wife and the family will accept the bed offer at [facility].  LCSW contacted Christine with Central Intake at [facility] and left a secure VM requesting  a returned call to secure bed offer.     LCSW contacted the patient's ex spouse to inquire about facility choice.  The family will view the facilities today and inform LCSW of the decision.  TOC following   Expected Discharge Plan: Skilled Nursing Facility Barriers to Discharge: Continued Medical Work up, English as a second language teacher, SNF Pending bed offer   Patient Goals and CMS Choice   CMS Medicare.gov Compare Post Acute Care list provided to:: Patient Choice offered to / list presented to : Patient      Expected Discharge Plan and Services In-house Referral: Clinical Social Work     Living arrangements for the past 2 months: Single Family Home                                      Prior Living Arrangements/Services Living arrangements for the past 2 months: Single Family Home Lives with:: Self Patient language and need for interpreter reviewed:: Yes Do you feel safe going back to the place where you live?: Yes      Need for Family Participation in Patient Care: Yes (Comment) Care giver support system in place?: Yes (comment)   Criminal Activity/Legal Involvement Pertinent to Current Situation/Hospitalization: No - Comment as needed  Activities of Daily Living Home Assistive Devices/Equipment: Walker (specify type) ADL  Screening (condition at time of admission) Patient's cognitive ability adequate to safely complete daily activities?: Yes Is the patient deaf or have difficulty hearing?: No Does the patient have difficulty seeing, even when wearing glasses/contacts?: No Does the patient have difficulty concentrating, remembering, or making decisions?: No Patient able to express need for assistance with ADLs?: Yes Does the patient have difficulty dressing or bathing?: Yes Independently performs ADLs?: No Communication: Independent Does the patient have difficulty walking or climbing stairs?: Yes Weakness of Legs: Both Weakness of Arms/Hands: None  Permission Sought/Granted   Permission granted to share information with : Yes, Verbal Permission Granted     Permission granted to share info w AGENCY: SNFs        Emotional Assessment Appearance:: Appears stated age Attitude/Demeanor/Rapport: Engaged Affect (typically observed): Appropriate Orientation: : Oriented to Place, Oriented to Self, Oriented to Situation Alcohol / Substance Use: Not Applicable Psych Involvement: No (comment)  Admission diagnosis:  Elevated blood pressure reading [R03.0] Weakness [R53.1] Acute cystitis with hematuria [N30.01] Abdominal pain [R10.9] Complication of Foley catheter, initial encounter (HCC) [B14.9XXA] Patient Active Problem List   Diagnosis Date Noted   Abdominal pain 09/29/2022   Prostate cancer (HCC) 09/29/2022   Weakness 09/29/2022   Complication of Foley catheter (HCC) 09/29/2022   Multiple falls 08/02/2022   Paronychia of great toe 03/05/2022   Hydronephrosis  Acute hyperkalemia    Obstructive uropathy 11/15/2021   Metabolic acidosis, increased anion gap 11/15/2021   Pleural effusion 11/15/2021   Acute renal failure (ARF) (HCC) 11/15/2021   Pain due to onychomycosis of toenails of both feet 12/19/2020   Physical deconditioning 11/27/2020   Dysequilibrium 11/27/2020   Spastic hemiplegia of  right dominant side due to cerebrovascular disease (HCC) 11/03/2017   Muscle wasting 09/25/2017   Depression 07/14/2017   Dysarthria as late effect of stroke 08/18/2016   Stage 2 chronic kidney disease    Leukocytosis    Essential hypertension    Spastic hemiparesis (HCC)    Aphasia due to recent cerebral infarction 07/04/2016   Cerebrovascular accident (CVA) (HCC)    Altered mental status 06/30/2016   Fall 06/30/2016   Acute encephalopathy 06/30/2016   Diarrhea 06/30/2016   Acute renal failure superimposed on stage 2 chronic kidney disease (HCC) 06/30/2016   Hemiparesis affecting right side as late effect of stroke (HCC) 10/23/2015   PAD (peripheral artery disease) (HCC) 10/20/2011   Onychomycosis of great toe 09/10/2011   Hyperlipidemia 02/21/2010   CEREBROVASCULAR ACCIDENT, HX OF 12/22/2008   T2DM (type 2 diabetes mellitus) (HCC) 07/30/2006   OBESITY, NOS 07/30/2006   Essential hypertension, benign 07/30/2006   PCP:  Reece Leader, DO Pharmacy:   [id number] [place], [place] - [address] AT [facility] [address]  [place] [address] Phone: [phone] Fax: [phone]     Social Determinants of Health (SDOH) Social History: SDOH Screenings   Food Insecurity: No Food Insecurity (09/29/2022)  Housing: Low Risk  (09/29/2022)  Transportation Needs: No Transportation Needs (09/29/2022)  Utilities: Not At Risk (09/29/2022)  Depression (PHQ2-9): Low Risk  (03/25/2021)  Tobacco Use: Low Risk  (09/28/2022)   SDOH Interventions:     Readmission Risk Interventions     No data to display

## 2022-10-02 NOTE — Progress Notes (Addendum)
     Daily Progress Note Intern Pager: [phone]  Patient name: [name] Medical record number: [id number] Date of birth: 1950/09/06 Age: 72 y.o. Gender: male  Primary Care Provider: Reece Leader, DO Consultants: None Code Status: Full   Pt Overview and Major Events to Date:  4/28: Admitted to FMTS 4/29: Palliative consult placed for goals of care discussion   Assessment and Plan: TYBERIUS RYNER is a 72 y.o. male presenting with abdominal pain and dislodged Foley catheter. . 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  * Abdominal pain Denies any abdominal pain and benign abdominal exam. No reported BM since prior to admission. -Start Miralax and Senna daily  Medically stable for discharge to SNF pending placement.  T2DM (type 2 diabetes mellitus) (HCC) Started Semglee 5U and received 19U SSI yesterday. BGL up to 300s yesterday, continues to be uncontrolled. -Continue Jardiance -Switch to mSSI -Add Novolog 3U TID with meals  Prostate cancer (HCC) H/o prostate cancer causing obstructive uropathy and necessity for foley catheter. -Palliative care following -Plan for OP Urology follow up  Essential hypertension BP around 150-160 systolic. -Continue home Amlodipine 10mg  daily and Carvedilol 25mg  BID  Elevated serum creatinine-resolved as of 10/02/2022 Cr improved to baseline. Good UOP.  Urinary tract infection-resolved as of 10/01/2022 Unclear if true UTI vs chronic colonization. Pt empirically treated and will finish 3day CTX today. - Ceftriaxone 4/29-5/1   FEN/GI: Dysphagia 3 PPx: Lovenox Dispo: SNF when bed available  Subjective:  Patient assessed at bedside, states his daughter is going to looka t SNF tomorrow afternoon. Discussed with patient if family can review SNF sooner than later so he would not have to extend stay in the hospital. Has not had a BM since admission.   Objective: Temp:  [97.8 F (36.6  C)-98.8 F (37.1 C)] 98.8 F (37.1 C) (05/02 0909) Pulse Rate:  [51-64] 63 (05/02 0925) Resp:  [17-18] 18 (05/02 0925) BP: (148-160)/(55-67) 156/56 (05/02 0909) SpO2:  [98 %-100 %] 99 % (05/02 0925) Physical Exam: General: Alert, pleasant older man laying in bed. NAD.  HEENT: NCAT. MMM. Cardiovascular: RRR, no murmurs Respiratory: CTAB. Normal WOB on RA.  Abdomen: Soft, nontender, nondistended. Normal BS. GU: Foley cath in place, foley bag full of yellow urine  Laboratory: Most recent CBC Lab Results  Component Value Date   WBC 8.5 09/30/2022   HGB 10.7 (L) 09/30/2022   HCT 32.3 (L) 09/30/2022   MCV 86.8 09/30/2022   PLT 133 (L) 09/30/2022   Most recent BMP    Latest Ref Rng & Units 10/02/2022    4:27 AM  BMP  Glucose 70 - 99 mg/dL 93   BUN 8 - 23 mg/dL 29   Creatinine 1.47 - 1.24 mg/dL 8.29   Sodium 562 - 130 mmol/L 138   Potassium 3.5 - 5.1 mmol/L 3.6   Chloride 98 - 111 mmol/L 103   CO2 22 - 32 mmol/L 27   Calcium 8.9 - 10.3 mg/dL 8.7     Other pertinent labs: BGL: 133   Elberta Fortis, MD 10/02/2022, 2:28 PM  PGY-1, [facility] Family Medicine FPTS Intern pager: [phone], text pages welcome Secure chat group [facility] Teaching Service

## 2022-10-02 NOTE — Care Management Important Message (Signed)
Important Message  Patient Details  Name: [name] MRN: [id number] Date of Birth: 22-Nov-1950   Medicare Important Message Given:  Yes     Renie Ora 10/02/2022, 1:01 PM

## 2022-10-02 NOTE — Progress Notes (Signed)
Occupational Therapy Treatment Patient Details Name: [name] MRN: [id number] DOB: [birth year] Today's Date: 10/02/2022   History of present illness 72 yo male presenting 4/28 with abdominal pain and dislodged foley catheter, at home in difficulty caring for himself. Has fallen just prior to admission, has UTI and elevated creatinine.  PMH - CVA, DM, DVT, HTN, PVD, ckd, PAD, prostate CA   OT comments  Pt sitting up in chair upon OT arrival. Pt agreeable to skilled OT session and participated well. OT provided and trained pt in use of R lambs wool hand protector to prevent skin breakdown with R hand decreased AROM. Pt may also benefit from a R resting hand splint to improve R digit extension and prevent contracture. Pt was also instructed in R elbow and digit self-ROM to increase ROM, prevent further contracture, and increase independence/decrease caregiver burden with UB ADLs. OT also instructed pt in L UE AROM exercises to maintain ROM and and strength for carryover to ADLs. Pt verbalized understanding and demonstrated understanding of all exercises through teach back. Pt will benefit from continued acute skilled OT services. Discharge remains appropriate.   Recommendations for follow up therapy are one component of a multi-disciplinary discharge planning process, led by the attending physician.  Recommendations may be updated based on patient status, additional functional criteria and insurance authorization.    Assistance Recommended at Discharge Set up Supervision/Assistance  Patient can return home with the following  A lot of help with walking and/or transfers;A lot of help with bathing/dressing/bathroom;Assistance with cooking/housework;Assist for transportation;Help with stairs or ramp for entrance;Direct supervision/assist for medications management   Equipment Recommendations       Recommendations for Other Services      Precautions / Restrictions Precautions Precautions:  Fall Precaution Comments: catheter, baseline R hemiplegia Restrictions Weight Bearing Restrictions: No Other Position/Activity Restrictions: cannot grip walker well with R hand       Mobility Bed Mobility                    Transfers                         Balance                                           ADL either performed or assessed with clinical judgement   ADL                                              Extremity/Trunk Assessment Upper Extremity Assessment Upper Extremity Assessment: RUE deficits/detail RUE Deficits / Details: elbow flexion 90 degrees with AAROM to 100 degrees, hand contractures, pt with decreased digit extension. pt denies wearing splint. reports splint is lost. Wrist with no extension in a neutral position.reports sensation is the same RUE Coordination: decreased fine motor;decreased gross motor            Vision       Perception     Praxis      Cognition Arousal/Alertness: Awake/alert Behavior During Therapy: WFL for tasks assessed/performed Overall Cognitive Status: Within Functional Limits for tasks assessed  Exercises Exercises: General Upper Extremity General Exercises - Upper Extremity Shoulder Flexion: Left, AROM, Other reps (comment), Seated (2 sets of 10 reps) Shoulder Extension: AROM, Left, Other reps (comment), Seated (2 sets of 10 reps) Shoulder ABduction: AROM, Left, Other reps (comment), Seated (2 sets of 10 reps) Shoulder ADduction: AROM, Left, Other reps (comment), Seated (2 sets of 10 reps) Elbow Flexion: AROM, Left, Self ROM, Right, Other reps (comment), Seated (2 sets of 10 reps) Elbow Extension: AROM, Left, Self ROM, Right, Other reps (comment), Seated (2 sets of 10 reps) Wrist Flexion: PROM, Right, Other reps (comment), Seated (2 sets of 10 reps) Wrist Extension: PROM, Right, Other reps (comment),  Seated (2 sets of 10 reps) Digit Composite Flexion: Self ROM, Right, Other reps (comment), Seated (2 sets of 10 reps) Composite Extension: Self ROM, Right, Seated, Other (comment) (2 sets of 10 reps)    Shoulder Instructions       General Comments Provided and trained pt in use of R lambs wool hand protector with pt demonstraing ability to doff/donn hand protector with Mod I. Pt would likely benefit from a R resting hand splint to improve R digit extension and prevent contracture. Pt VSS on room air.    Pertinent Vitals/ Pain       Pain Assessment Pain Assessment: No/denies pain Faces Pain Scale: No hurt  Home Living                                          Prior Functioning/Environment              Frequency           Progress Toward Goals  OT Goals(current goals can now be found in the care plan section)  Progress towards OT goals: Progressing toward goals  Acute Rehab OT Goals Patient Stated Goal: Pt stated he would like to have less stiffness in joints.  Plan Discharge plan remains appropriate    Co-evaluation                 AM-PAC OT "6 Clicks" Daily Activity     Outcome Measure   Help from another person eating meals?: A Little Help from another person taking care of personal grooming?: A Little Help from another person toileting, which includes using toliet, bedpan, or urinal?: A Lot Help from another person bathing (including washing, rinsing, drying)?: A Lot Help from another person to put on and taking off regular upper body clothing?: A Little Help from another person to put on and taking off regular lower body clothing?: A Lot 6 Click Score: 15    End of Session    OT Visit Diagnosis: Unsteadiness on feet (R26.81);Muscle weakness (generalized) (M62.81);Hemiplegia and hemiparesis Hemiplegia - Right/Left: Right Hemiplegia - dominant/non-dominant: Dominant Hemiplegia - caused by: Cerebral infarction (CVA in 2008)    Activity Tolerance Patient tolerated treatment well   Patient Left in chair;with call bell/phone within reach;with chair alarm set   Nurse Communication Mobility status        Time: 1610-9604 OT Time Calculation (min): 30 min  Charges: OT General Charges $OT Visit: 1 Visit OT Treatments $Therapeutic Exercise: 8-22 mins $ Splint materials basic: 1 Supply  [name] "Orson Eva., OTR/L, MA Acute Rehab [phone]   [name] 10/02/2022, 3:59 PM

## 2022-10-03 DIAGNOSIS — T839XXA Unspecified complication of genitourinary prosthetic device, implant and graft, initial encounter: Secondary | ICD-10-CM | POA: Diagnosis not present

## 2022-10-03 LAB — GLUCOSE, CAPILLARY
Glucose-Capillary: 100 mg/dL — ABNORMAL HIGH (ref 70–99)
Glucose-Capillary: 201 mg/dL — ABNORMAL HIGH (ref 70–99)
Glucose-Capillary: 210 mg/dL — ABNORMAL HIGH (ref 70–99)
Glucose-Capillary: 293 mg/dL — ABNORMAL HIGH (ref 70–99)

## 2022-10-03 MED ORDER — DICLOFENAC SODIUM 1 % EX GEL
2.0000 g | Freq: Four times a day (QID) | CUTANEOUS | Status: DC
  Administered 2022-10-03 – 2022-10-15 (×45): 2 g via TOPICAL
  Filled 2022-10-03 (×2): qty 100

## 2022-10-03 MED ORDER — INSULIN ASPART 100 UNIT/ML IJ SOLN
5.0000 [IU] | Freq: Three times a day (TID) | INTRAMUSCULAR | Status: DC
  Administered 2022-10-03 – 2022-10-04 (×3): 5 [IU] via SUBCUTANEOUS

## 2022-10-03 NOTE — TOC Progression Note (Signed)
Transition of Care [facility]) - Initial/Assessment Note    Patient Details  Name: [name] MRN: [id number] Date of Birth: 1950-07-19  Transition of Care [facility]) CM/SW Contact:    Ralene Bathe, LCSWA Phone Number: 10/03/2022, 12:45 PM  Clinical Narrative:                 Insurance authorization for SNF is pending.  Patient, family, and MD updated.    TOC following.  Expected Discharge Plan: Skilled Nursing Facility Barriers to Discharge: Continued Medical Work up, English as a second language teacher, SNF Pending bed offer   Patient Goals and CMS Choice   CMS Medicare.gov Compare Post Acute Care list provided to:: Patient Choice offered to / list presented to : Patient      Expected Discharge Plan and Services In-house Referral: Clinical Social Work     Living arrangements for the past 2 months: Single Family Home                                      Prior Living Arrangements/Services Living arrangements for the past 2 months: Single Family Home Lives with:: Self Patient language and need for interpreter reviewed:: Yes Do you feel safe going back to the place where you live?: Yes      Need for Family Participation in Patient Care: Yes (Comment) Care giver support system in place?: Yes (comment)   Criminal Activity/Legal Involvement Pertinent to Current Situation/Hospitalization: No - Comment as needed  Activities of Daily Living Home Assistive Devices/Equipment: Walker (specify type) ADL Screening (condition at time of admission) Patient's cognitive ability adequate to safely complete daily activities?: Yes Is the patient deaf or have difficulty hearing?: No Does the patient have difficulty seeing, even when wearing glasses/contacts?: No Does the patient have difficulty concentrating, remembering, or making decisions?: No Patient able to express need for assistance with ADLs?: Yes Does the patient have difficulty dressing or bathing?: Yes Independently performs  ADLs?: No Communication: Independent Does the patient have difficulty walking or climbing stairs?: Yes Weakness of Legs: Both Weakness of Arms/Hands: None  Permission Sought/Granted   Permission granted to share information with : Yes, Verbal Permission Granted     Permission granted to share info w AGENCY: SNFs        Emotional Assessment Appearance:: Appears stated age Attitude/Demeanor/Rapport: Engaged Affect (typically observed): Appropriate Orientation: : Oriented to Place, Oriented to Self, Oriented to Situation Alcohol / Substance Use: Not Applicable Psych Involvement: No (comment)  Admission diagnosis:  Elevated blood pressure reading [R03.0] Weakness [R53.1] Acute cystitis with hematuria [N30.01] Abdominal pain [R10.9] Complication of Foley catheter, initial encounter (HCC) [W29.9XXA] Patient Active Problem List   Diagnosis Date Noted   Abdominal pain 09/29/2022   Prostate cancer (HCC) 09/29/2022   Weakness 09/29/2022   Complication of Foley catheter (HCC) 09/29/2022   Multiple falls 08/02/2022   Paronychia of great toe 03/05/2022   Hydronephrosis    Acute hyperkalemia    Obstructive uropathy 11/15/2021   Metabolic acidosis, increased anion gap 11/15/2021   Pleural effusion 11/15/2021   Acute renal failure (ARF) (HCC) 11/15/2021   Pain due to onychomycosis of toenails of both feet 12/19/2020   Physical deconditioning 11/27/2020   Dysequilibrium 11/27/2020   Spastic hemiplegia of right dominant side due to cerebrovascular disease (HCC) 11/03/2017   Muscle wasting 09/25/2017   Depression 07/14/2017   Dysarthria as late effect of stroke 08/18/2016   Stage 2 chronic  kidney disease    Leukocytosis    Essential hypertension    Spastic hemiparesis (HCC)    Aphasia due to recent cerebral infarction 07/04/2016   Cerebrovascular accident (CVA) (HCC)    Altered mental status 06/30/2016   Fall 06/30/2016   Acute encephalopathy 06/30/2016   Diarrhea 06/30/2016    Acute renal failure superimposed on stage 2 chronic kidney disease (HCC) 06/30/2016   Hemiparesis affecting right side as late effect of stroke (HCC) 10/23/2015   PAD (peripheral artery disease) (HCC) 10/20/2011   Onychomycosis of great toe 09/10/2011   Hyperlipidemia 02/21/2010   CEREBROVASCULAR ACCIDENT, HX OF 12/22/2008   T2DM (type 2 diabetes mellitus) (HCC) 07/30/2006   OBESITY, NOS 07/30/2006   Essential hypertension, benign 07/30/2006   PCP:  Reece Leader, DO Pharmacy:   [id number] [place] [place] - [address] AT [facility] [address] [place] [address] Phone: [phone] Fax: [phone]     Social Determinants of Health (SDOH) Social History: SDOH Screenings   Food Insecurity: No Food Insecurity (09/29/2022)  Housing: Low Risk  (09/29/2022)  Transportation Needs: No Transportation Needs (09/29/2022)  Utilities: Not At Risk (09/29/2022)  Depression (PHQ2-9): Low Risk  (03/25/2021)  Tobacco Use: Low Risk  (09/28/2022)   SDOH Interventions:     Readmission Risk Interventions     No data to display

## 2022-10-03 NOTE — Progress Notes (Signed)
Physical Therapy Treatment Patient Details Name: [name] MRN: [id number] DOB: [birth year] Today's Date: 10/03/2022   History of Present Illness 72 yo male presenting 4/28 with abdominal pain and dislodged foley catheter, at home in difficulty caring for himself. Has fallen just prior to admission, has UTI and elevated creatinine.  PMH - CVA, DM, DVT, HTN, PVD, ckd, PAD, prostate CA    PT Comments    Pt with good progress towards acute goals this session progressing gait into hall with RW support and min A to steady. Pt able to come to stand x3 throughout session with grossly min guard and maintain standing without UE support while placing R hand on walker using L hand to assist. Pt demonstrating step pivot transfer from [facility] to recliner with min guard for safety without LOB. Pt pleasant and participatory throughout session. Pt continues to benefit from skilled PT services to progress toward functional mobility goals.    Recommendations for follow up therapy are one component of a multi-disciplinary discharge planning process, led by the attending physician.  Recommendations may be updated based on patient status, additional functional criteria and insurance authorization.  Follow Up Recommendations       Assistance Recommended at Discharge Frequent or constant Supervision/Assistance  Patient can return home with the following A lot of help with walking and/or transfers;A little help with bathing/dressing/bathroom;Assistance with cooking/housework;Assist for transportation;Help with stairs or ramp for entrance   Equipment Recommendations  None recommended by PT    Recommendations for Other Services       Precautions / Restrictions Precautions Precautions: Fall Precaution Comments: catheter, baseline R hemiplegia Restrictions Weight Bearing Restrictions: No     Mobility  Bed Mobility Overal bed mobility: Needs Assistance             General bed mobility comments: pt up  OOB on arrival    Transfers Overall transfer level: Needs assistance Equipment used: Rolling walker (2 wheels), None Transfers: Sit to/from Stand, Bed to chair/wheelchair/BSC Sit to Stand: Min assist, Min guard   Step pivot transfers: Min guard       General transfer comment: light min A to power up from [facility] without AD, m,in gaurd from recliner x3 during session    Ambulation/Gait Ambulation/Gait assistance: Min assist Gait Distance (Feet): 15 Feet (+ 5') Assistive device: Rolling walker (2 wheels) Gait Pattern/deviations: Step-to pattern, Decreased stride length, Decreased stance time - right, Decreased weight shift to right, Wide base of support, Trunk flexed Gait velocity: reduced     General Gait Details: pt able to place R hand on RW  with L hand assisting, chair follow for safety as pt fatigues quickly, very slow cadence and flexed posture throughout with pt unable to correct despite cues.   Stairs             Wheelchair Mobility    Modified Rankin (Stroke Patients Only)       Balance Overall balance assessment: Needs assistance Sitting-balance support: Feet supported Sitting balance-Leahy Scale: Fair     Standing balance support: Single extremity supported, During functional activity Standing balance-Leahy Scale: Poor Standing balance comment: using LUE to support gait which is likely his baseline with SPC but endurance is very low for this strategy                            Cognition Arousal/Alertness: Awake/alert Behavior During Therapy: WFL for tasks assessed/performed Overall Cognitive Status: Within Functional Limits for tasks  assessed                                          Exercises Other Exercises Other Exercises: seated marching x20    General Comments        Pertinent Vitals/Pain Pain Assessment Pain Assessment: No/denies pain Pain Intervention(s): Monitored during session    Home Living                           Prior Function            PT Goals (current goals can now be found in the care plan section) Acute Rehab PT Goals PT Goal Formulation: With patient Time For Goal Achievement: 10/13/22 Progress towards PT goals: Progressing toward goals    Frequency    Min 3X/week      PT Plan      Co-evaluation              AM-PAC PT "6 Clicks" Mobility   Outcome Measure  Help needed turning from your back to your side while in a flat bed without using bedrails?: A Little Help needed moving from lying on your back to sitting on the side of a flat bed without using bedrails?: A Lot Help needed moving to and from a bed to a chair (including a wheelchair)?: A Little Help needed standing up from a chair using your arms (e.g., wheelchair or bedside chair)?: A Little Help needed to walk in hospital room?: A Little Help needed climbing 3-5 steps with a railing? : Total 6 Click Score: 15    End of Session Equipment Utilized During Treatment: Gait belt Activity Tolerance: Patient limited by fatigue;Patient tolerated treatment well Patient left: with call bell/phone within reach;in chair Nurse Communication: Mobility status PT Visit Diagnosis: Unsteadiness on feet (R26.81);Muscle weakness (generalized) (M62.81);Difficulty in walking, not elsewhere classified (R26.2);Hemiplegia and hemiparesis Hemiplegia - Right/Left: Right Hemiplegia - dominant/non-dominant: Dominant     Time: 9147-8295 PT Time Calculation (min) (ACUTE ONLY): 19 min  Charges:  $Gait Training: 8-22 mins                     Shawne Eskelson R. PTA Acute Rehabilitation Services Office: [phone]   Catalina Antigua 10/03/2022, 2:08 PM

## 2022-10-03 NOTE — Progress Notes (Signed)
     Daily Progress Note Intern Pager: [phone]  Patient name: [name] Medical record number: [id number] Date of birth: [birth year] Age: 72 y.o. Gender: male  Primary Care Provider: Reece Leader, DO Consultants: None Code Status: Full  Pt Overview and Major Events to Date:  4/28 Admitted  4/29 Palliative consult for GOC  Assessment and Plan: SARAN SCHUENEMAN is a 72 y.o. male presenting with abdominal pain and dislodged Foley catheter. Pt is now medically stable for discharge, awaiting Pertinent PMH/PSH includes previous CVA, HTN, T2DM, prostamegaly 2/2 cancer, and depression.   * Abdominal pain-resolved as of 10/03/2022 - Cont miralax and senna  T2DM (type 2 diabetes mellitus) (HCC) - Increased mealtime Novolog to 5u TID - Cont Glargine 10u daily - cont mSSI -Continue Jardiance  Prostate cancer [facility]) H/o prostate cancer causing obstructive uropathy and necessity for foley catheter. -Palliative care following -Plan for OP Urology follow up  Essential hypertension -Continue home Amlodipine 10mg  daily and Carvedilol 25mg  BID  Elevated serum creatinine-resolved as of 10/02/2022 Cr improved to baseline. Good UOP.  Urinary tract infection-resolved as of 10/01/2022 Unclear if true UTI vs chronic colonization. Pt empirically treated and will finish 3day CTX today. - Ceftriaxone 4/29-5/1   FEN/GI: Dysphagia PPx: Lovenox Dispo:SNF pending SNF placement. Medically stable for discharge.  Subjective:  NAEO. Pt denies any pain or discomfort today.  Objective: Temp:  [97.9 F (36.6 C)-98.6 F (37 C)] 98.3 F (36.8 C) (05/03 0913) Pulse Rate:  [51-54] 54 (05/03 0913) Resp:  [17-20] 17 (05/03 0913) BP: (123-155)/(49-58) 139/55 (05/03 0913) SpO2:  [100 %] 100 % (05/03 0913) Physical Exam: General: Alert, pleasant older man laying in bed. NAD. HEENT: NCAT. MMM.  Cardiovascular: RRR, no murmurs Respiratory: Normal WOB on RA. CTAB. Abdomen: Soft, nontender, nondistended.  Normal BS.   Laboratory: Most recent CBC Lab Results  Component Value Date   WBC 8.5 09/30/2022   HGB 10.7 (L) 09/30/2022   HCT 32.3 (L) 09/30/2022   MCV 86.8 09/30/2022   PLT 133 (L) 09/30/2022   Most recent BMP    Latest Ref Rng & Units 10/02/2022    4:27 AM  BMP  Glucose 70 - 99 mg/dL 93   BUN 8 - 23 mg/dL 29   Creatinine 1.47 - 1.24 mg/dL 8.29   Sodium 562 - 130 mmol/L 138   Potassium 3.5 - 5.1 mmol/L 3.6   Chloride 98 - 111 mmol/L 103   CO2 22 - 32 mmol/L 27   Calcium 8.9 - 10.3 mg/dL 8.7    [name], MD 10/03/2022, 1:02 PM  PGY-1, [facility] Family [facility] FPTS Intern pager: [phone], text pages welcome Secure chat group [facility] Teaching Service

## 2022-10-04 DIAGNOSIS — E1159 Type 2 diabetes mellitus with other circulatory complications: Secondary | ICD-10-CM | POA: Diagnosis not present

## 2022-10-04 DIAGNOSIS — Z794 Long term (current) use of insulin: Secondary | ICD-10-CM

## 2022-10-04 LAB — GLUCOSE, CAPILLARY
Glucose-Capillary: 143 mg/dL — ABNORMAL HIGH (ref 70–99)
Glucose-Capillary: 317 mg/dL — ABNORMAL HIGH (ref 70–99)
Glucose-Capillary: 162 mg/dL — ABNORMAL HIGH (ref 70–99)
Glucose-Capillary: 201 mg/dL — ABNORMAL HIGH (ref 70–99)

## 2022-10-04 MED ORDER — INSULIN ASPART 100 UNIT/ML IJ SOLN
7.0000 [IU] | Freq: Three times a day (TID) | INTRAMUSCULAR | Status: DC
  Administered 2022-10-04 – 2022-10-09 (×13): 7 [IU] via SUBCUTANEOUS

## 2022-10-04 NOTE — Progress Notes (Signed)
     Daily Progress Note Intern Pager: [phone]  Patient name: [name] Medical record number: [id number] Date of birth: Apr 07, 1951 Age: 72 y.o. Gender: male  Primary Care Provider: Reece Leader, DO Consultants: none Code Status: Full  Pt Overview and Major Events to Date:  4/28 Admitted  4/29 Palliative consult for GOC  Assessment and Plan: [name] is a 72 y.o. male presenting with abdominal pain and dislodged Foley catheter. Pt is now medically stable for discharge, awaiting SNF placement. 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   * Abdominal pain-resolved as of 10/03/2022 - Cont miralax and senna  T2DM (type 2 diabetes mellitus) (HCC) - Increased mealtime Novolog to 7u TID w/ meals - Cont Glargine 10u daily - cont mSSI -Continue Jardiance  Prostate cancer [facility]) H/o prostate cancer causing obstructive uropathy and necessity for foley catheter. -Palliative care following -Plan for OP Urology follow up  Essential hypertension -Continue home Amlodipine 10mg  daily and Carvedilol 25mg  BID  Elevated serum creatinine-resolved as of 10/02/2022 Cr improved to baseline. Good UOP.  Urinary tract infection-resolved as of 10/01/2022 Unclear if true UTI vs chronic colonization. Pt empirically treated and will finish 3day CTX today. - Ceftriaxone 4/29-5/1   FEN/GI: Dysphagia PPx: Lovenox Dispo:SNF pending placement.  Subjective:  NAEO. Pt denies any new pain or discomfort.   Objective: Temp:  [97.9 F (36.6 C)-99 F (37.2 C)] 98 F (36.7 C) (05/04 0924) Pulse Rate:  [52-59] 59 (05/04 0924) Resp:  [17-19] 17 (05/04 0924) BP: (116-157)/(43-65) 135/62 (05/04 0924) SpO2:  [100 %] 100 % (05/04 0924) Physical Exam: General: Alert, friendly older man laying in bed eating breakfast. NAD. HEENT: NCAT. MMM.  Cardiovascular: RRR, no murmurs Respiratory: CTAB. Normal WOB on RA.  Abdomen: Soft, nontender,  nondistended. Normal BS.   Laboratory: Most recent CBC Lab Results  Component Value Date   WBC 8.5 09/30/2022   HGB 10.7 (L) 09/30/2022   HCT 32.3 (L) 09/30/2022   MCV 86.8 09/30/2022   PLT 133 (L) 09/30/2022   Most recent BMP    Latest Ref Rng & Units 10/02/2022    4:27 AM  BMP  Glucose 70 - 99 mg/dL 93   BUN 8 - 23 mg/dL 29   Creatinine 2.02 - 1.24 mg/dL 5.42   Sodium 706 - 237 mmol/L 138   Potassium 3.5 - 5.1 mmol/L 3.6   Chloride 98 - 111 mmol/L 103   CO2 22 - 32 mmol/L 27   Calcium 8.9 - 10.3 mg/dL 8.7    [name], MD 10/04/2022, 10:49 AM  PGY-1, [place] [facility] FPTS Intern pager: [phone], text pages welcome Secure chat group [facility] Teaching Service

## 2022-10-04 NOTE — Plan of Care (Signed)
  Problem: Metabolic: Goal: Ability to maintain appropriate glucose levels will improve Outcome: Not Progressing   

## 2022-10-05 DIAGNOSIS — T839XXA Unspecified complication of genitourinary prosthetic device, implant and graft, initial encounter: Secondary | ICD-10-CM | POA: Diagnosis not present

## 2022-10-05 LAB — GLUCOSE, CAPILLARY
Glucose-Capillary: 247 mg/dL — ABNORMAL HIGH (ref 70–99)
Glucose-Capillary: 252 mg/dL — ABNORMAL HIGH (ref 70–99)
Glucose-Capillary: 241 mg/dL — ABNORMAL HIGH (ref 70–99)
Glucose-Capillary: 264 mg/dL — ABNORMAL HIGH (ref 70–99)

## 2022-10-05 MED ORDER — INSULIN ASPART 100 UNIT/ML IJ SOLN
5.0000 [IU] | Freq: Once | INTRAMUSCULAR | Status: AC
  Administered 2022-10-05: 5 [IU] via SUBCUTANEOUS

## 2022-10-05 MED ORDER — ACETAMINOPHEN 325 MG PO TABS
650.0000 mg | ORAL_TABLET | Freq: Four times a day (QID) | ORAL | Status: DC | PRN
  Administered 2022-10-05 – 2022-10-07 (×2): 650 mg via ORAL
  Filled 2022-10-05 (×2): qty 2

## 2022-10-05 MED ORDER — INSULIN GLARGINE-YFGN 100 UNIT/ML ~~LOC~~ SOLN
15.0000 [IU] | Freq: Every day | SUBCUTANEOUS | Status: DC
  Filled 2022-10-05: qty 0.15

## 2022-10-05 NOTE — Plan of Care (Signed)
?  Problem: Nutrition: ?Goal: Adequate nutrition will be maintained ?Outcome: Not Progressing ?  ?Problem: Safety: ?Goal: Ability to remain free from injury will improve ?Outcome: Not Progressing ?  ?

## 2022-10-05 NOTE — Progress Notes (Signed)
     Daily Progress Note Intern Pager: [phone]  Patient name: [name] Medical record number: [id number] Date of birth: 1951-02-03 Age: 72 y.o. Gender: male  Primary Care Provider: Reece Leader, DO Consultants: None Code Status: Full  Pt Overview and Major Events to Date:  4/28- admitted 4/29- palliative care consulted for GOC conversation  Assessment and Plan: [name] is a 71yo male who presented initially with abdominal pain and a dislodged foley catheter. He is now medically stable for discharge to SNF. Pertinent PMH/PSH includes prostatomegaly 2/2 prostate CA, prior CVA, HTN, T2DM, depression .  * Prostate cancer [facility]) H/o prostate cancer causing obstructive uropathy and necessity for foley catheter. -Palliative care following -Plan for OP Urology follow up  T2DM (type 2 diabetes mellitus) (HCC) Mealtime Aspart was increased to 7 units yesterday. Glucoses 162-201.  - Continue Novolog to 7u TID w/ meals - Cont Glargine 10u daily - cont mSSI - Continue Jardiance  Essential hypertension -Continue home Amlodipine 10mg  daily and Carvedilol 25mg  BID  Elevated serum creatinine-resolved as of 10/02/2022 Cr improved to baseline. Good UOP.  Urinary tract infection-resolved as of 10/01/2022 Unclear if true UTI vs chronic colonization. Pt empirically treated and will finish 3day CTX today. - Ceftriaxone 4/29-5/1  Abdominal pain-resolved as of 10/03/2022 - Cont miralax and senna       FEN/GI: Dysphagia 3 (mechanical soft) PPx: Lovenox Dispo:SNF tomorrow. Barriers include placement. He is medically stable.    Subjective:  Tells me he is "fine" this morning.   Objective: Temp:  [97.4 F (36.3 C)-98.3 F (36.8 C)] 97.4 F (36.3 C) (05/05 0424) Pulse Rate:  [50-59] 53 (05/04 2013) Resp:  [17-18] 18 (05/05 0424) BP: (120-135)/(50-64) 134/64 (05/05 0424) SpO2:  [97 %-100 %] 100 % (05/05 0424) Physical Exam: General: Sleeping on arrival, awakens to  voice Cardiovascular: RRR, no murmur Respiratory: Normal WOB on RA, lungs clear in all fields Abdomen: Non-tender, non-distended  Laboratory: Most recent CBC Lab Results  Component Value Date   WBC 8.5 09/30/2022   HGB 10.7 (L) 09/30/2022   HCT 32.3 (L) 09/30/2022   MCV 86.8 09/30/2022   PLT 133 (L) 09/30/2022   Most recent BMP    Latest Ref Rng & Units 10/02/2022    4:27 AM  BMP  Glucose 70 - 99 mg/dL 93   BUN 8 - 23 mg/dL 29   Creatinine 0.10 - 1.24 mg/dL 2.72   Sodium 536 - 644 mmol/L 138   Potassium 3.5 - 5.1 mmol/L 3.6   Chloride 98 - 111 mmol/L 103   CO2 22 - 32 mmol/L 27   Calcium 8.9 - 10.3 mg/dL 8.7      Imaging/Diagnostic Tests: No new imaging, tests Kyle Amel, MD 10/05/2022, 5:53 AM  PGY-2,  Family Medicine FPTS Intern pager: [phone], text pages welcome Secure chat group [facility] Teaching Service

## 2022-10-06 ENCOUNTER — Ambulatory Visit: Payer: Medicare HMO | Admitting: Family Medicine

## 2022-10-06 DIAGNOSIS — T839XXA Unspecified complication of genitourinary prosthetic device, implant and graft, initial encounter: Secondary | ICD-10-CM | POA: Diagnosis not present

## 2022-10-06 LAB — BASIC METABOLIC PANEL
Anion gap: 6 (ref 5–15)
BUN: 34 mg/dL — ABNORMAL HIGH (ref 8–23)
CO2: 25 mmol/L (ref 22–32)
Calcium: 8.5 mg/dL — ABNORMAL LOW (ref 8.9–10.3)
Chloride: 108 mmol/L (ref 98–111)
Creatinine, Ser: 1.41 mg/dL — ABNORMAL HIGH (ref 0.61–1.24)
GFR, Estimated: 53 mL/min — ABNORMAL LOW (ref >60)
Glucose, Bld: 94 mg/dL (ref 70–99)
Potassium: 4.6 mmol/L (ref 3.5–5.1)
Sodium: 139 mmol/L (ref 135–145)

## 2022-10-06 LAB — GLUCOSE, CAPILLARY
Glucose-Capillary: 92 mg/dL (ref 70–99)
Glucose-Capillary: 265 mg/dL — ABNORMAL HIGH (ref 70–99)
Glucose-Capillary: 380 mg/dL — ABNORMAL HIGH (ref 70–99)
Glucose-Capillary: 180 mg/dL — ABNORMAL HIGH (ref 70–99)

## 2022-10-06 MED ORDER — INSULIN GLARGINE-YFGN 100 UNIT/ML ~~LOC~~ SOLN: 12.0000 [IU] | Freq: Every day | SUBCUTANEOUS | Status: DC

## 2022-10-06 MED ORDER — INSULIN GLARGINE-YFGN 100 UNIT/ML ~~LOC~~ SOLN
12.0000 [IU] | Freq: Every day | SUBCUTANEOUS | Status: DC
  Administered 2022-10-06 – 2022-10-14 (×9): 12 [IU] via SUBCUTANEOUS
  Filled 2022-10-06 (×10): qty 0.12

## 2022-10-06 MED ORDER — CARVEDILOL 12.5 MG PO TABS
12.5000 mg | ORAL_TABLET | Freq: Two times a day (BID) | ORAL | Status: DC
  Administered 2022-10-06 – 2022-10-15 (×18): 12.5 mg via ORAL
  Filled 2022-10-06 (×18): qty 1

## 2022-10-06 NOTE — TOC Progression Note (Addendum)
Transition of Care [facility]) - Initial/Assessment Note    Patient Details  Name: [name] MRN: [id number] Date of Birth: 1951/02/22  Transition of Care [facility]) CM/SW Contact:    Ralene Bathe, LCSWA Phone Number: 10/06/2022, 10:23 AM  Clinical Narrative:                 LCSW contacted Christine in [place] [facility] [employer] to inquire about insurance authorization and is awaiting a response.  12:05-  LCSW contacted Whitney with [place] [facility] [employer] vand inquired about insurance authorization.  Authorization is still pending at this time.  Patient's family updated.   TOC following.   Expected Discharge Plan: Skilled Nursing Facility Barriers to Discharge: Continued Medical Work up, English as a second language teacher, SNF Pending bed offer   Patient Goals and CMS Choice   CMS Medicare.gov Compare Post Acute Care list provided to:: Patient Choice offered to / list presented to : Patient      Expected Discharge Plan and Services In-house Referral: Clinical Social Work     Living arrangements for the past 2 months: Single Family Home                                      Prior Living Arrangements/Services Living arrangements for the past 2 months: Single Family Home Lives with:: Self Patient language and need for interpreter reviewed:: Yes Do you feel safe going back to the place where you live?: Yes      Need for Family Participation in Patient Care: Yes (Comment) Care giver support system in place?: Yes (comment)   Criminal Activity/Legal Involvement Pertinent to Current Situation/Hospitalization: No - Comment as needed  Activities of Daily Living Home Assistive Devices/Equipment: Walker (specify type) ADL Screening (condition at time of admission) Patient's cognitive ability adequate to safely complete daily activities?: Yes Is the patient deaf or have difficulty hearing?: No Does the patient have difficulty seeing, even when wearing glasses/contacts?:  No Does the patient have difficulty concentrating, remembering, or making decisions?: No Patient able to express need for assistance with ADLs?: Yes Does the patient have difficulty dressing or bathing?: Yes Independently performs ADLs?: No Communication: Independent Does the patient have difficulty walking or climbing stairs?: Yes Weakness of Legs: Both Weakness of Arms/Hands: None  Permission Sought/Granted   Permission granted to share information with : Yes, Verbal Permission Granted     Permission granted to share info w AGENCY: SNFs        Emotional Assessment Appearance:: Appears stated age Attitude/Demeanor/Rapport: Engaged Affect (typically observed): Appropriate Orientation: : Oriented to Place, Oriented to Self, Oriented to Situation Alcohol / Substance Use: Not Applicable Psych Involvement: No (comment)  Admission diagnosis:  Elevated blood pressure reading [R03.0] Weakness [R53.1] Acute cystitis with hematuria [N30.01] Abdominal pain [R10.9] Complication of Foley catheter, initial encounter (HCC) [N62.9XXA] Patient Active Problem List   Diagnosis Date Noted   Prostate cancer (HCC) 09/29/2022   Weakness 09/29/2022   Complication of Foley catheter (HCC) 09/29/2022   Multiple falls 08/02/2022   Paronychia of great toe 03/05/2022   Hydronephrosis    Acute hyperkalemia    Obstructive uropathy 11/15/2021   Metabolic acidosis, increased anion gap 11/15/2021   Pleural effusion 11/15/2021   Acute renal failure (ARF) (HCC) 11/15/2021   Pain due to onychomycosis of toenails of both feet 12/19/2020   Physical deconditioning 11/27/2020   Dysequilibrium 11/27/2020   Spastic hemiplegia of right  dominant side due to cerebrovascular disease (HCC) 11/03/2017   Muscle wasting 09/25/2017   Depression 07/14/2017   Dysarthria as late effect of stroke 08/18/2016   Stage 2 chronic kidney disease    Leukocytosis    Essential hypertension    Spastic hemiparesis (HCC)     Aphasia due to recent cerebral infarction 07/04/2016   Cerebrovascular accident (CVA) (HCC)    Altered mental status 06/30/2016   Fall 06/30/2016   Acute encephalopathy 06/30/2016   Diarrhea 06/30/2016   Acute renal failure superimposed on stage 2 chronic kidney disease (HCC) 06/30/2016   Hemiparesis affecting right side as late effect of stroke (HCC) 10/23/2015   PAD (peripheral artery disease) (HCC) 10/20/2011   Onychomycosis of great toe 09/10/2011   Hyperlipidemia 02/21/2010   CEREBROVASCULAR ACCIDENT, HX OF 12/22/2008   T2DM (type 2 diabetes mellitus) (HCC) 07/30/2006   OBESITY, NOS 07/30/2006   Essential hypertension, benign 07/30/2006   PCP:  Reece Leader, DO Pharmacy:   [id number] - [place], [place] - [address] AT [facility] [address] [place] [address] Phone: [phone] Fax: [phone]     Social Determinants of Health (SDOH) Social History: SDOH Screenings   Food Insecurity: No Food Insecurity (09/29/2022)  Housing: Low Risk  (09/29/2022)  Transportation Needs: No Transportation Needs (09/29/2022)  Utilities: Not At Risk (09/29/2022)  Depression (PHQ2-9): Low Risk  (03/25/2021)  Tobacco Use: Low Risk  (09/28/2022)   SDOH Interventions:     Readmission Risk Interventions     No data to display

## 2022-10-06 NOTE — Progress Notes (Addendum)
     Daily Progress Note Intern Pager: [phone]  Patient name: [name] Medical record number: [id number] Date of birth: May 30, 1951 Age: 72 y.o. Gender: male  Primary Care Provider: Reece Leader, DO Consultants: None Code Status: FULL  Pt Overview and Major Events to Date:  4/28: Admitted 4/29: Palliative care consulted for GOC conversation 5/2: Medically stable for discharge  Assessment and Plan: [name] is a 72 y.o. male presenting with abdominal pain and dislodged Foley catheter. Pt is now medically stable for discharge, awaiting SNF placement. 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   * Prostate cancer [facility]) H/o prostate cancer causing obstructive uropathy and necessity for foley catheter. -Palliative care following -Plan for OP Urology follow up  T2DM (type 2 diabetes mellitus) (HCC) Glucose 92 this AM. CTM. - Continue Novolog to 7u TID w/ meals - Change to Semglee 12 U - cont mSSI - Continue Jardiance  Essential hypertension HR <60 this AM. -Continue home Amlodipine 10mg  daily  - Carvedilol 12.5 mg BID   FEN/GI: DYS 3 PPx: Lovenox Dispo:SNF  pending auth .  Subjective:  No acute concerns.  Objective: Temp:  [98.2 F (36.8 C)-98.5 F (36.9 C)] 98.5 F (36.9 C) (05/06 0915) Pulse Rate:  [48-61] 57 (05/06 0915) Resp:  [18] 18 (05/06 0915) BP: (121-135)/(51-56) 135/52 (05/06 0915) SpO2:  [99 %-100 %] 99 % (05/06 0915) Physical Exam: General: NAD, eating breakfast Cardiovascular: RRR, no murmurs Respiratory: CTAB, normal wob on RA Extremities: No edema  Laboratory: Most recent CBC Lab Results  Component Value Date   WBC 8.5 09/30/2022   HGB 10.7 (L) 09/30/2022   HCT 32.3 (L) 09/30/2022   MCV 86.8 09/30/2022   PLT 133 (L) 09/30/2022   Most recent BMP    Latest Ref Rng & Units 10/06/2022    1:06 AM  BMP  Glucose 70 - 99 mg/dL 94   BUN 8 - 23 mg/dL 34   Creatinine 1.30 - 1.24  mg/dL 8.65   Sodium 784 - 696 mmol/L 139   Potassium 3.5 - 5.1 mmol/L 4.6   Chloride 98 - 111 mmol/L 108   CO2 22 - 32 mmol/L 25   Calcium 8.9 - 10.3 mg/dL 8.5    Tiffany Kocher, DO 10/06/2022, 11:27 AM  PGY-1, [facility] FPTS Intern pager: [phone], text pages welcome Secure chat group [facility] Teaching Service

## 2022-10-07 DIAGNOSIS — T839XXA Unspecified complication of genitourinary prosthetic device, implant and graft, initial encounter: Secondary | ICD-10-CM | POA: Diagnosis not present

## 2022-10-07 LAB — GLUCOSE, CAPILLARY
Glucose-Capillary: 101 mg/dL — ABNORMAL HIGH (ref 70–99)
Glucose-Capillary: 202 mg/dL — ABNORMAL HIGH (ref 70–99)
Glucose-Capillary: 186 mg/dL — ABNORMAL HIGH (ref 70–99)
Glucose-Capillary: 250 mg/dL — ABNORMAL HIGH (ref 70–99)

## 2022-10-07 MED ORDER — AMLODIPINE BESYLATE 5 MG PO TABS
5.0000 mg | ORAL_TABLET | Freq: Every day | ORAL | Status: DC
  Administered 2022-10-08 – 2022-10-10 (×3): 5 mg via ORAL
  Filled 2022-10-07 (×3): qty 1

## 2022-10-07 NOTE — TOC Progression Note (Signed)
Transition of Care [facility]) - Initial/Assessment Note    Patient Details  Name: [name] MRN: [id number] Date of Birth: 02/18/1951  [facility]) CM/SW Contact:    Ralene Bathe, LCSWA Phone Number: 10/07/2022, 9:16 AM  Clinical Narrative:                 LCSW contacted Christine with [facility] central intake for [employer].  Insurance authorization is still pending.  Family updated.  TOC following.    Expected Discharge Plan: Skilled Nursing Facility Barriers to Discharge: Continued Medical Work up, English as a second language teacher, SNF Pending bed offer   Patient Goals and CMS Choice   CMS Medicare.gov Compare Post Acute Care list provided to:: Patient Choice offered to / list presented to : Patient      Expected Discharge Plan and Services In-house Referral: Clinical Social Work     Living arrangements for the past 2 months: Single Family Home                                      Prior Living Arrangements/Services Living arrangements for the past 2 months: Single Family Home Lives with:: Self Patient language and need for interpreter reviewed:: Yes Do you feel safe going back to the place where you live?: Yes      Need for Family Participation in Patient Care: Yes (Comment) Care giver support system in place?: Yes (comment)   Criminal Activity/Legal Involvement Pertinent to Current Situation/Hospitalization: No - Comment as needed  Activities of Daily Living Home Assistive Devices/Equipment: Walker (specify type) ADL Screening (condition at time of admission) Patient's cognitive ability adequate to safely complete daily activities?: Yes Is the patient deaf or have difficulty hearing?: No Does the patient have difficulty seeing, even when wearing glasses/contacts?: No Does the patient have difficulty concentrating, remembering, or making decisions?: No Patient able to express need for assistance with ADLs?: Yes Does the patient have difficulty  dressing or bathing?: Yes Independently performs ADLs?: No Communication: Independent Does the patient have difficulty walking or climbing stairs?: Yes Weakness of Legs: Both Weakness of Arms/Hands: None  Permission Sought/Granted   Permission granted to share information with : Yes, Verbal Permission Granted     Permission granted to share info w AGENCY: SNFs        Emotional Assessment Appearance:: Appears stated age Attitude/Demeanor/Rapport: Engaged Affect (typically observed): Appropriate Orientation: : Oriented to Place, Oriented to Self, Oriented to Situation Alcohol / Substance Use: Not Applicable Psych Involvement: No (comment)  Admission diagnosis:  Elevated blood pressure reading [R03.0] Weakness [R53.1] Acute cystitis with hematuria [N30.01] Abdominal pain [R10.9] Complication of Foley catheter, initial encounter (HCC) [W09.9XXA] Patient Active Problem List   Diagnosis Date Noted   Prostate cancer (HCC) 09/29/2022   Weakness 09/29/2022   Complication of Foley catheter (HCC) 09/29/2022   Multiple falls 08/02/2022   Paronychia of great toe 03/05/2022   Hydronephrosis    Acute hyperkalemia    Obstructive uropathy 11/15/2021   Metabolic acidosis, increased anion gap 11/15/2021   Pleural effusion 11/15/2021   Acute renal failure (ARF) (HCC) 11/15/2021   Pain due to onychomycosis of toenails of both feet 12/19/2020   Physical deconditioning 11/27/2020   Dysequilibrium 11/27/2020   Spastic hemiplegia of right dominant side due to cerebrovascular disease (HCC) 11/03/2017   Muscle wasting 09/25/2017   Depression 07/14/2017   Dysarthria as late effect of stroke 08/18/2016   Stage  2 chronic kidney disease    Leukocytosis    Essential hypertension    Spastic hemiparesis (HCC)    Aphasia due to recent cerebral infarction 07/04/2016   Cerebrovascular accident (CVA) (HCC)    Altered mental status 06/30/2016   Fall 06/30/2016   Acute encephalopathy 06/30/2016    Diarrhea 06/30/2016   Acute renal failure superimposed on stage 2 chronic kidney disease (HCC) 06/30/2016   Hemiparesis affecting right side as late effect of stroke (HCC) 10/23/2015   PAD (peripheral artery disease) (HCC) 10/20/2011   Onychomycosis of great toe 09/10/2011   Hyperlipidemia 02/21/2010   CEREBROVASCULAR ACCIDENT, HX OF 12/22/2008   T2DM (type 2 diabetes mellitus) (HCC) 07/30/2006   OBESITY, NOS 07/30/2006   Essential hypertension, benign 07/30/2006   PCP:  Reece Leader, DO Pharmacy:   [id number] - [place], [place] - [address] AT [facility] [address] [place] [address] Phone: [phone] Fax: [phone]     Social Determinants of Health (SDOH) Social History: SDOH Screenings   Food Insecurity: No Food Insecurity (09/29/2022)  Housing: Low Risk  (09/29/2022)  Transportation Needs: No Transportation Needs (09/29/2022)  Utilities: Not At Risk (09/29/2022)  Depression (PHQ2-9): Low Risk  (03/25/2021)  Tobacco Use: Low Risk  (09/28/2022)   SDOH Interventions:     Readmission Risk Interventions     No data to display

## 2022-10-07 NOTE — Progress Notes (Signed)
     Daily Progress Note Intern Pager: [phone]  Patient name: [name] Medical record number: [id number] Date of birth: 04-10-1951 Age: 72 y.o. Gender: male  Primary Care Provider: Reece Leader, DO Consultants: None Code Status: FULL  Pt Overview and Major Events to Date:  4/28: Admitted 4/29: Palliative care consulted for GOC conversation 5/2: Medically stable for discharge  Assessment and Plan: [name] is a 72 y.o. male presenting with abdominal pain and dislodged Foley catheter. Pt is now medically stable for discharge, awaiting SNF placement. 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   * Prostate cancer [facility]) H/o prostate cancer causing obstructive uropathy and necessity for foley catheter. -Palliative care following -Plan for OP Urology follow up  T2DM (type 2 diabetes mellitus) (HCC) Glucose 101 this AM. CTM. - Continue Novolog to 7u TID w/ meals - Continue Semglee 12 U - cont mSSI - Continue Jardiance  Essential hypertension HR <60 this AM. -Continue home Amlodipine 10mg  daily  - Carvedilol 12.5 mg BID   FEN/GI: DYS 3 PPx: Lovenox Dispo:SNF  pending insurance auth .  Subjective:  No acute concerns.  Objective: Temp:  [98 F (36.7 C)-98.5 F (36.9 C)] 98.5 F (36.9 C) (05/07 0841) Pulse Rate:  [51-58] 58 (05/07 0841) Resp:  [18] 18 (05/07 0841) BP: (115-149)/(46-53) 115/46 (05/07 0841) SpO2:  [99 %-100 %] 99 % (05/07 0841) Physical Exam: General: NAD, eating breakfast Cardiovascular: RRR, no murmurs Respiratory: CTAB, normal WOB on RA Abdomen: Soft, not tender, not distended Extremities: No edema  Laboratory: Most recent CBC Lab Results  Component Value Date   WBC 8.5 09/30/2022   HGB 10.7 (L) 09/30/2022   HCT 32.3 (L) 09/30/2022   MCV 86.8 09/30/2022   PLT 133 (L) 09/30/2022   Most recent BMP    Latest Ref Rng & Units 10/06/2022    1:06 AM  BMP  Glucose 70 - 99 mg/dL 94    BUN 8 - 23 mg/dL 34   Creatinine 7.84 - 1.24 mg/dL 6.96   Sodium 295 - 284 mmol/L 139   Potassium 3.5 - 5.1 mmol/L 4.6   Chloride 98 - 111 mmol/L 108   CO2 22 - 32 mmol/L 25   Calcium 8.9 - 10.3 mg/dL 8.5    Tiffany Kocher, DO 10/07/2022, 9:47 AM  PGY-1, [place] [facility] FPTS Intern pager: [phone], text pages welcome Secure chat group [facility] Teaching Service

## 2022-10-07 NOTE — Progress Notes (Signed)
Physical Therapy Treatment Patient Details Name: [name] MRN: [id number] DOB: May 10, 1951 Today's Date: 10/07/2022   History of Present Illness 72 yo male presenting 4/28 with abdominal pain and dislodged foley catheter, at home in difficulty caring for himself. Has fallen just prior to admission, has UTI and elevated creatinine.  PMH - CVA, DM, DVT, HTN, PVD, ckd, PAD, prostate CA    PT Comments    Pt greeted supine in bed and agreeable to session, however pt limited by bil foot pain with pt reporting worsening with weight bearing. Pt needing grossly min A for OOB mobility with pt able to complete bed mobility with min guard for safety and increased time. Pt continues to fatigue quickly needing chair follow during short gait bout in room with min A to steady and manage RW. Current plan remains appropriate to address deficits and maximize functional independence and decrease caregiver burden. Pt continues to benefit from skilled PT services to progress toward functional mobility goals.    Recommendations for follow up therapy are one component of a multi-disciplinary discharge planning process, led by the attending physician.  Recommendations may be updated based on patient status, additional functional criteria and insurance authorization.  Follow Up Recommendations       Assistance Recommended at Discharge Frequent or constant Supervision/Assistance  Patient can return home with the following A lot of help with walking and/or transfers;A little help with bathing/dressing/bathroom;Assistance with cooking/housework;Assist for transportation;Help with stairs or ramp for entrance   Equipment Recommendations  None recommended by PT    Recommendations for Other Services       Precautions / Restrictions Precautions Precautions: Fall Precaution Comments: catheter, baseline R hemiplegia Restrictions Weight Bearing Restrictions: No Other Position/Activity Restrictions: cannot grip  walker well with R hand     Mobility  Bed Mobility Overal bed mobility: Needs Assistance Bed Mobility: Supine to Sit     Supine to sit: HOB elevated, Min guard     General bed mobility comments: min gaurd for safety, increased time and use of bed features    Transfers Overall transfer level: Needs assistance Equipment used: Rolling walker (2 wheels), None Transfers: Sit to/from Stand, Bed to chair/wheelchair/BSC Sit to Stand: Min assist           General transfer comment: min A to power up with x2 trials needed to achieve, encouraged rocking technique as pt with difficulty clearing hips intially    Ambulation/Gait Ambulation/Gait assistance: Min assist Gait Distance (Feet): 10 Feet Assistive device: Rolling walker (2 wheels) Gait Pattern/deviations: Step-to pattern, Decreased stride length, Decreased stance time - right, Decreased weight shift to right, Wide base of support, Trunk flexed Gait velocity: reduced     General Gait Details: pt able to place R hand on RW with L hand assisting, chair follow for safety as pt fatigues quickly, very slow cadence and flexed posture throughout with pt unable to correct despite cues with distance limited by foot pain   Stairs             Wheelchair Mobility    Modified Rankin (Stroke Patients Only)       Balance Overall balance assessment: Needs assistance Sitting-balance support: Feet supported Sitting balance-Leahy Scale: Fair     Standing balance support: Single extremity supported, During functional activity Standing balance-Leahy Scale: Poor Standing balance comment: using LUE to support gait which is likely his baseline with SPC but endurance is very low for this strategy  Cognition Arousal/Alertness: Awake/alert Behavior During Therapy: WFL for tasks assessed/performed Overall Cognitive Status: Within Functional Limits for tasks assessed                                           Exercises Other Exercises Other Exercises: seated marching x20, LAQ x15 ea side    General Comments        Pertinent Vitals/Pain Pain Assessment Pain Assessment: Faces Faces Pain Scale: Hurts little more Pain Location: bil feet Pain Descriptors / Indicators: Sore Pain Intervention(s): Monitored during session, Limited activity within patient's tolerance    Home Living                          Prior Function            PT Goals (current goals can now be found in the care plan section) Acute Rehab PT Goals PT Goal Formulation: With patient Time For Goal Achievement: 10/13/22 Progress towards PT goals: Progressing toward goals    Frequency    Min 3X/week      PT Plan      Co-evaluation              AM-PAC PT "6 Clicks" Mobility   Outcome Measure  Help needed turning from your back to your side while in a flat bed without using bedrails?: A Little Help needed moving from lying on your back to sitting on the side of a flat bed without using bedrails?: A Lot Help needed moving to and from a bed to a chair (including a wheelchair)?: A Little Help needed standing up from a chair using your arms (e.g., wheelchair or bedside chair)?: A Little Help needed to walk in hospital room?: A Little Help needed climbing 3-5 steps with a railing? : Total 6 Click Score: 15    End of Session Equipment Utilized During Treatment: Gait belt Activity Tolerance: Patient limited by pain Patient left: with call bell/phone within reach;in chair Nurse Communication: Mobility status PT Visit Diagnosis: Unsteadiness on feet (R26.81);Muscle weakness (generalized) (M62.81);Difficulty in walking, not elsewhere classified (R26.2);Hemiplegia and hemiparesis Hemiplegia - Right/Left: Right Hemiplegia - dominant/non-dominant: Dominant     Time: 1020-1035 PT Time Calculation (min) (ACUTE ONLY): 15 min  Charges:  $Therapeutic Exercise: 8-22  mins                     Twanisha Foulk R. PTA Acute Rehabilitation Services Office: [phone]    Catalina Antigua 10/07/2022, 10:49 AM

## 2022-10-08 DIAGNOSIS — T839XXA Unspecified complication of genitourinary prosthetic device, implant and graft, initial encounter: Secondary | ICD-10-CM | POA: Diagnosis not present

## 2022-10-08 LAB — GLUCOSE, CAPILLARY
Glucose-Capillary: 111 mg/dL — ABNORMAL HIGH (ref 70–99)
Glucose-Capillary: 244 mg/dL — ABNORMAL HIGH (ref 70–99)
Glucose-Capillary: 240 mg/dL — ABNORMAL HIGH (ref 70–99)
Glucose-Capillary: 231 mg/dL — ABNORMAL HIGH (ref 70–99)

## 2022-10-08 MED ORDER — ORAL CARE MOUTH RINSE: 15.0000 mL | OROMUCOSAL | Status: DC | PRN

## 2022-10-08 NOTE — Progress Notes (Signed)
     Daily Progress Note Intern Pager: [phone]  Patient name: [name] Medical record number: [id number] Date of birth: 10-Aug-1950 Age: 72 y.o. Gender: male  Primary Care Provider: Reece Leader, DO Consultants: None Code Status: FULL  Pt Overview and Major Events to Date:  4/28: Admitted 4/29: Palliative care consulted for GOC conversation 5/2: Medically stable for discharge  Assessment and Plan: [name] is a 72 y.o. male presenting with abdominal pain and dislodged Foley catheter. Pt is now medically stable for discharge, awaiting SNF placement. 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   * Prostate cancer [facility]) H/o prostate cancer causing obstructive uropathy and necessity for foley catheter. -Palliative care following -Plan for OP Urology follow up  T2DM (type 2 diabetes mellitus) (HCC) Stable. CTM. - Continue Novolog to 7u TID w/ meals - Continue Semglee 12 U - cont mSSI - Continue Jardiance  Essential hypertension HR <60 this AM. Pressures elevated with decrease in amlodipine, however had low diastolic prior to dose decrease. CTM. - Continue home Amlodipine 5 mg daily  - Carvedilol 12.5 mg BID   FEN/GI: DYS 3 PPx: Lovenox Dispo:SNF  pending insurance auth .  Subjective:  No acute concerns.  Objective: Temp:  [97.7 F (36.5 C)-98.3 F (36.8 C)] 97.7 F (36.5 C) (05/08 0802) Pulse Rate:  [53-58] 58 (05/08 0802) Resp:  [17-18] 17 (05/08 0802) BP: (121-164)/(47-68) 164/68 (05/08 0802) SpO2:  [99 %-100 %] 100 % (05/08 0802) Physical Exam: General: NAD, resting comfortably in bed Cardiovascular: RRR, no murmurs Respiratory: CTAB, normal WOB on RA Abdomen: Soft, not tender, not distended Extremities: Moving all 4 extremities, no edema  Laboratory: Most recent CBC Lab Results  Component Value Date   WBC 8.5 09/30/2022   HGB 10.7 (L) 09/30/2022   HCT 32.3 (L) 09/30/2022   MCV 86.8  09/30/2022   PLT 133 (L) 09/30/2022   Most recent BMP    Latest Ref Rng & Units 10/06/2022    1:06 AM  BMP  Glucose 70 - 99 mg/dL 94   BUN 8 - 23 mg/dL 34   Creatinine 1.47 - 1.24 mg/dL 8.29   Sodium 562 - 130 mmol/L 139   Potassium 3.5 - 5.1 mmol/L 4.6   Chloride 98 - 111 mmol/L 108   CO2 22 - 32 mmol/L 25   Calcium 8.9 - 10.3 mg/dL 8.5    Tiffany Kocher, DO 10/08/2022, 12:28 PM  PGY-1, [place] [facility] FPTS Intern pager: [phone], text pages welcome Secure chat group [facility] Teaching Service

## 2022-10-08 NOTE — Progress Notes (Signed)
Occupational Therapy Treatment Patient Details Name: [name] MRN: [id number] DOB: 1950/06/24 Today's Date: 10/08/2022   History of present illness 72 yo male presenting 4/28 with abdominal pain and dislodged foley catheter, at home in difficulty caring for himself. Has fallen just prior to admission, has UTI and elevated creatinine.  PMH - CVA, DM, DVT, HTN, PVD, ckd, PAD, prostate CA   OT comments  Pt sitting in recliner upon OT arrival. Pt declined participation in ADLs and functional transfers secondary to having already washed up and R foot pain. Pt agreeable to B UE therapeutic exercises to increase L UE strength and improve/maintain R UE ROM for increased R UE comfort, decreased caregiver burden, and carryover to safety and independence with ADLs, functional transfers, and functional mobility. Pt making progress toward goals. Pt will benefit from continued acute skilled OT services to increase safety and independence with ADLs, functional transfers, and functional mobility. Discharge plan remains appropriate.    Recommendations for follow up therapy are one component of a multi-disciplinary discharge planning process, led by the attending physician.  Recommendations may be updated based on patient status, additional functional criteria and insurance authorization.    Assistance Recommended at Discharge    Patient can return home with the following  A little help with walking and/or transfers;A lot of help with bathing/dressing/bathroom;Assistance with cooking/housework;Direct supervision/assist for medications management;Assist for transportation;Help with stairs or ramp for entrance   Equipment Recommendations       Recommendations for Other Services      Precautions / Restrictions Precautions Precautions: Fall Precaution Comments: catheter, baseline R hemiplegia Restrictions Weight Bearing Restrictions: No Other Position/Activity Restrictions: cannot grip walker well with R  hand       Mobility Bed Mobility                    Transfers                   General transfer comment: Pt sitting up in recliner upon OT arrival. Pt refused transfers during OT session this secoondary to R foot pain.     Balance                                           ADL either performed or assessed with clinical judgement   ADL                                         General ADL Comments: Pt refused addressing bathing and toileting transfers this session secondary to already washing with NT earlier in the day and pain in R foot.    Extremity/Trunk Assessment              Vision       Perception     Praxis      Cognition Arousal/Alertness: Awake/alert Behavior During Therapy: WFL for tasks assessed/performed Overall Cognitive Status: Within Functional Limits for tasks assessed                                          Exercises Exercises: General Upper Extremity General Exercises - Upper Extremity Shoulder Flexion: Self ROM, Right, AROM, Left, Other reps (  comment), Seated (2 sets 10 reps) Shoulder Extension: Self ROM, Right, AROM, Left, Other reps (comment), Seated (2 sets 10 reps) Elbow Flexion: AROM, Left, Self ROM, Right, Other reps (comment), Seated (2 sets 10 reps) Elbow Extension: AROM, Left, Self ROM, Right, Other reps (comment), Seated (2 sets 10 reps) Digit Composite Flexion: Self ROM, Right, Other reps (comment), Seated (2 sets 10 reps) Composite Extension: Self ROM, Right, Seated, Other (comment) (2 sets 10 reps)    Shoulder Instructions       General Comments OT instucted pt in theraputic B UE exercises to increase L UE strength and maintain R UE ROM to increase independence and comfort and decreased caregiver burden during ADLs.    Pertinent Vitals/ Pain       Pain Assessment Pain Assessment: Faces Faces Pain Scale: Hurts even more Pain Location: R foot Pain  Descriptors / Indicators: Sore Pain Intervention(s): Limited activity within patient's tolerance, Monitored during session  Home Living                                          Prior Functioning/Environment              Frequency           Progress Toward Goals  OT Goals(current goals can now be found in the care plan section)  Progress towards OT goals: Progressing toward goals  Acute Rehab OT Goals Patient Stated Goal: To have a snack  Plan Discharge plan remains appropriate    Co-evaluation                 AM-PAC OT "6 Clicks" Daily Activity     Outcome Measure   Help from another person eating meals?: A Little Help from another person taking care of personal grooming?: A Little Help from another person toileting, which includes using toliet, bedpan, or urinal?: A Lot Help from another person bathing (including washing, rinsing, drying)?: A Lot Help from another person to put on and taking off regular upper body clothing?: A Little Help from another person to put on and taking off regular lower body clothing?: A Lot 6 Click Score: 15    End of Session    Hemiplegia - Right/Left: Right Hemiplegia - dominant/non-dominant: Dominant Hemiplegia - caused by: Cerebral infarction (CVA in 2008)   Activity Tolerance Patient tolerated treatment well   Patient Left in chair;with call bell/phone within reach;with chair alarm set   Nurse Communication Mobility status        Time: 5621-3086 OT Time Calculation (min): 19 min  Charges: OT General Charges $OT Visit: 1 Visit OT Treatments $Therapeutic Exercise: 8-22 mins  Kavion Mancinas "Orson Eva., OTR/L, MA Acute Rehab [phone]   Lendon Colonel 10/08/2022, 5:38 PM

## 2022-10-08 NOTE — Progress Notes (Signed)
Physical Therapy Treatment Patient Details Name: [name] MRN: [id number] DOB: 22-Dec-1950 Today's Date: 10/08/2022   History of Present Illness 72 yo male presenting 4/28 with abdominal pain and dislodged foley catheter, at home in difficulty caring for himself. Has fallen just prior to admission, has UTI and elevated creatinine.  PMH - CVA, DM, DVT, HTN, PVD, ckd, PAD, prostate CA    PT Comments    Pt greeted up in chair on arrival and agreeable to session with continued progress towards acute goals, however pt limited by fatigue, decreased activity tolerance and R foot pain this date. Pt able to progress gait distance this session with min guard for safety and RW support, chair follow utilized for safety as pt quick to fatigue. OT arriving at end of session and pt handed off for OT session. Current plan remains appropriate to address deficits and maximize functional independence and decrease caregiver burden. Pt continues to benefit from skilled PT services to progress toward functional mobility goals.    Recommendations for follow up therapy are one component of a multi-disciplinary discharge planning process, led by the attending physician.  Recommendations may be updated based on patient status, additional functional criteria and insurance authorization.  Follow Up Recommendations       Assistance Recommended at Discharge Frequent or constant Supervision/Assistance  Patient can return home with the following A lot of help with walking and/or transfers;A little help with bathing/dressing/bathroom;Assistance with cooking/housework;Assist for transportation;Help with stairs or ramp for entrance   Equipment Recommendations  None recommended by PT    Recommendations for Other Services       Precautions / Restrictions Precautions Precautions: Fall Precaution Comments: catheter, baseline R hemiplegia Restrictions Weight Bearing Restrictions: No Other Position/Activity  Restrictions: cannot grip walker well with R hand     Mobility  Bed Mobility Overal bed mobility: Needs Assistance             General bed mobility comments: pt up in chair on arrival    Transfers Overall transfer level: Needs assistance Equipment used: Rolling walker (2 wheels), None Transfers: Sit to/from Stand, Bed to chair/wheelchair/BSC Sit to Stand: Min assist, Min guard           General transfer comment: min guard initially up to min A after bout of gait due to fatigue    Ambulation/Gait Ambulation/Gait assistance: Min assist Gait Distance (Feet): 25 Feet (+ 8') Assistive device: Rolling walker (2 wheels) Gait Pattern/deviations: Step-to pattern, Decreased stride length, Decreased stance time - right, Decreased weight shift to right, Wide base of support, Trunk flexed Gait velocity: reduced     General Gait Details: pt able to place R hand on RW with L hand assisting, chair follow for safety as pt fatigues quickly, very slow cadence and flexed posture throughout with pt unable to correct despite cues with distance limited by foot pain   Stairs             Wheelchair Mobility    Modified Rankin (Stroke Patients Only)       Balance Overall balance assessment: Needs assistance Sitting-balance support: Feet supported Sitting balance-Leahy Scale: Fair     Standing balance support: Single extremity supported, During functional activity Standing balance-Leahy Scale: Poor Standing balance comment: using LUE to support gait which is likely his baseline with SPC but endurance is very low for this strategy  Cognition Arousal/Alertness: Awake/alert Behavior During Therapy: WFL for tasks assessed/performed Overall Cognitive Status: Within Functional Limits for tasks assessed                                          Exercises      General Comments        Pertinent Vitals/Pain Pain  Assessment Pain Assessment: Faces Faces Pain Scale: Hurts even more Pain Location: R foot Pain Descriptors / Indicators: Sore Pain Intervention(s): Monitored during session, Limited activity within patient's tolerance    Home Living                          Prior Function            PT Goals (current goals can now be found in the care plan section) Acute Rehab PT Goals PT Goal Formulation: With patient Time For Goal Achievement: 10/13/22 Progress towards PT goals: Progressing toward goals    Frequency    Min 3X/week      PT Plan      Co-evaluation              AM-PAC PT "6 Clicks" Mobility   Outcome Measure  Help needed turning from your back to your side while in a flat bed without using bedrails?: A Little Help needed moving from lying on your back to sitting on the side of a flat bed without using bedrails?: A Lot Help needed moving to and from a bed to a chair (including a wheelchair)?: A Little Help needed standing up from a chair using your arms (e.g., wheelchair or bedside chair)?: A Little Help needed to walk in hospital room?: A Little Help needed climbing 3-5 steps with a railing? : Total 6 Click Score: 15    End of Session Equipment Utilized During Treatment: Gait belt Activity Tolerance: Patient limited by pain Patient left: with call bell/phone within reach;in chair;Other (comment) (with OT present) Nurse Communication: Mobility status PT Visit Diagnosis: Unsteadiness on feet (R26.81);Muscle weakness (generalized) (M62.81);Difficulty in walking, not elsewhere classified (R26.2);Hemiplegia and hemiparesis Hemiplegia - Right/Left: Right Hemiplegia - dominant/non-dominant: Dominant     Time: 6578-4696 PT Time Calculation (min) (ACUTE ONLY): 16 min  Charges:  $Gait Training: 8-22 mins                     Ilhan Madan R. PTA Acute Rehabilitation Services Office: [phone]   Catalina Antigua 10/08/2022, 5:00 PM

## 2022-10-08 NOTE — TOC Progression Note (Addendum)
Transition of Care [facility]) - Initial/Assessment Note    Patient Details  Name: [name] MRN: [id number] Date of Birth: 1950/07/03  Transition of Care [facility]) CM/SW Contact:    Ralene Bathe, LCSWA Phone Number: 10/08/2022, 9:02 AM  Clinical Narrative:                 LCSW contacted Christine with [facility] intake for [employer].  Insurance authorization is still pending.    Addendum 1500- TOC leadership, MD, and patient's family updated.   TOC following.    Expected Discharge Plan: Skilled Nursing Facility Barriers to Discharge: Continued Medical Work up, English as a second language teacher, SNF Pending bed offer   Patient Goals and CMS Choice   CMS Medicare.gov Compare Post Acute Care list provided to:: Patient Choice offered to / list presented to : Patient      Expected Discharge Plan and Services In-house Referral: Clinical Social Work     Living arrangements for the past 2 months: Single Family Home                                      Prior Living Arrangements/Services Living arrangements for the past 2 months: Single Family Home Lives with:: Self Patient language and need for interpreter reviewed:: Yes Do you feel safe going back to the place where you live?: Yes      Need for Family Participation in Patient Care: Yes (Comment) Care giver support system in place?: Yes (comment)   Criminal Activity/Legal Involvement Pertinent to Current Situation/Hospitalization: No - Comment as needed  Activities of Daily Living Home Assistive Devices/Equipment: Walker (specify type) ADL Screening (condition at time of admission) Patient's cognitive ability adequate to safely complete daily activities?: Yes Is the patient deaf or have difficulty hearing?: No Does the patient have difficulty seeing, even when wearing glasses/contacts?: No Does the patient have difficulty concentrating, remembering, or making decisions?: No Patient able to express need for  assistance with ADLs?: Yes Does the patient have difficulty dressing or bathing?: Yes Independently performs ADLs?: No Communication: Independent Does the patient have difficulty walking or climbing stairs?: Yes Weakness of Legs: Both Weakness of Arms/Hands: None  Permission Sought/Granted   Permission granted to share information with : Yes, Verbal Permission Granted     Permission granted to share info w AGENCY: SNFs        Emotional Assessment Appearance:: Appears stated age Attitude/Demeanor/Rapport: Engaged Affect (typically observed): Appropriate Orientation: : Oriented to Place, Oriented to Self, Oriented to Situation Alcohol / Substance Use: Not Applicable Psych Involvement: No (comment)  Admission diagnosis:  Elevated blood pressure reading [R03.0] Weakness [R53.1] Acute cystitis with hematuria [N30.01] Abdominal pain [R10.9] Complication of Foley catheter, initial encounter (HCC) [N82.9XXA] Patient Active Problem List   Diagnosis Date Noted   Prostate cancer (HCC) 09/29/2022   Weakness 09/29/2022   Complication of Foley catheter (HCC) 09/29/2022   Multiple falls 08/02/2022   Paronychia of great toe 03/05/2022   Hydronephrosis    Acute hyperkalemia    Obstructive uropathy 11/15/2021   Metabolic acidosis, increased anion gap 11/15/2021   Pleural effusion 11/15/2021   Acute renal failure (ARF) (HCC) 11/15/2021   Pain due to onychomycosis of toenails of both feet 12/19/2020   Physical deconditioning 11/27/2020   Dysequilibrium 11/27/2020   Spastic hemiplegia of right dominant side due to cerebrovascular disease (HCC) 11/03/2017   Muscle wasting 09/25/2017   Depression 07/14/2017  Dysarthria as late effect of stroke 08/18/2016   Stage 2 chronic kidney disease    Leukocytosis    Essential hypertension    Spastic hemiparesis (HCC)    Aphasia due to recent cerebral infarction 07/04/2016   Cerebrovascular accident (CVA) (HCC)    Altered mental status  06/30/2016   Fall 06/30/2016   Acute encephalopathy 06/30/2016   Diarrhea 06/30/2016   Acute renal failure superimposed on stage 2 chronic kidney disease (HCC) 06/30/2016   Hemiparesis affecting right side as late effect of stroke (HCC) 10/23/2015   PAD (peripheral artery disease) (HCC) 10/20/2011   Onychomycosis of great toe 09/10/2011   Hyperlipidemia 02/21/2010   CEREBROVASCULAR ACCIDENT, HX OF 12/22/2008   T2DM (type 2 diabetes mellitus) (HCC) 07/30/2006   OBESITY, NOS 07/30/2006   Essential hypertension, benign 07/30/2006   PCP:  Reece Leader, DO Pharmacy:   [id number] [place], [place] - [address] AT [facility] OF [address] [place] [address] Phone: [phone] Fax: [phone]     Social Determinants of Health (SDOH) Social History: SDOH Screenings   Food Insecurity: No Food Insecurity (09/29/2022)  Housing: Low Risk  (09/29/2022)  Transportation Needs: No Transportation Needs (09/29/2022)  Utilities: Not At Risk (09/29/2022)  Depression (PHQ2-9): Low Risk  (03/25/2021)  Tobacco Use: Low Risk  (09/28/2022)   SDOH Interventions:     Readmission Risk Interventions     No data to display

## 2022-10-09 DIAGNOSIS — C61 Malignant neoplasm of prostate: Secondary | ICD-10-CM | POA: Diagnosis not present

## 2022-10-09 DIAGNOSIS — T839XXA Unspecified complication of genitourinary prosthetic device, implant and graft, initial encounter: Secondary | ICD-10-CM | POA: Diagnosis not present

## 2022-10-09 DIAGNOSIS — R531 Weakness: Secondary | ICD-10-CM | POA: Diagnosis not present

## 2022-10-09 LAB — GLUCOSE, CAPILLARY
Glucose-Capillary: 106 mg/dL — ABNORMAL HIGH (ref 70–99)
Glucose-Capillary: 255 mg/dL — ABNORMAL HIGH (ref 70–99)
Glucose-Capillary: 92 mg/dL (ref 70–99)
Glucose-Capillary: 145 mg/dL — ABNORMAL HIGH (ref 70–99)

## 2022-10-09 MED ORDER — INSULIN ASPART 100 UNIT/ML IJ SOLN
10.0000 [IU] | Freq: Three times a day (TID) | INTRAMUSCULAR | Status: DC
  Administered 2022-10-09 – 2022-10-10 (×5): 10 [IU] via SUBCUTANEOUS

## 2022-10-09 MED ORDER — ORAL CARE MOUTH RINSE: 15.0000 mL | OROMUCOSAL | Status: DC | PRN

## 2022-10-09 NOTE — Plan of Care (Signed)

## 2022-10-09 NOTE — TOC Progression Note (Addendum)
Transition of Care [facility]) - Initial/Assessment Note    Patient Details  Name: [name] MRN: [id number] Date of Birth: 1950/07/03  Transition of Care [facility]) CM/SW Contact:    Ralene Bathe, LCSWA Phone Number: 10/09/2022, 8:57 AM  Clinical Narrative:                 LCSW contacted [facility] to inquire about insurance authorization for patient to transition to SNF and is awaiting a response.   Addendum 10:00am-  LCSW was informed by Wynona Canes with [employer] Int[facility] that insurance authorization for SNF had been denied, but [employer] offered a peer to peer to be completed by noon today.  MD and patient's family informed.  Addendum 10:45am- LCSW notified by MD that the deadline for peer to peer has expired.  The peer to peer needed to be completed by noon on 10/06/2021.  LCSW contacted the [facility] with [employer] Intake for [employer] (SNF).  The facility reports that they were not notified that a peer to peer was requested until today.  Christine provided LCSW with the number to complete an appeal.    LCSW met with the patient at bedside.  The patient requested that an appeal be completed.  LCSW and patient contacted [employer] at [phone] to request an expedited appeal.  After speaking with a representative (Miranda) and being on hold for 40 minutes,  LCSW and patient were informed that the department that handles appeals is unable to answer and a voicemail needed to be left requesting a returned call to complete the appeal.  LCSW left voicemail requesting a returned call to complete the appeal at number [phone].     Addendum 1500-  After not receiving a returned call from [employer] to file appeal, LCSW called the appeal number, [phone]. There was no answer.  LCSW left another voicemail requesting a returned call.  TOC leadership, Raymondo Band, informed.    Addendum 17:30- Patient's daughter, [name], called LCSW and was provided with  update.   TOC following.   Expected Discharge Plan: Skilled Nursing Facility Barriers to Discharge: Continued Medical Work up, English as a second language teacher, SNF Pending bed offer   Patient Goals and CMS Choice   CMS Medicare.gov Compare Post Acute Care list provided to:: Patient Choice offered to / list presented to : Patient      Expected Discharge Plan and Services In-house Referral: Clinical Social Work     Living arrangements for the past 2 months: Single Family Home                                      Prior Living Arrangements/Services Living arrangements for the past 2 months: Single Family Home Lives with:: Self Patient language and need for interpreter reviewed:: Yes Do you feel safe going back to the place where you live?: Yes      Need for Family Participation in Patient Care: Yes (Comment) Care giver support system in place?: Yes (comment)   Criminal Activity/Legal Involvement Pertinent to Current Situation/Hospitalization: No - Comment as needed  Activities of Daily Living Home Assistive Devices/Equipment: Dan Humphreys (specify type) ADL Screening (condition at time of admission) Patient's cognitive ability adequate to safely complete daily activities?: Yes Is the patient deaf or have difficulty hearing?: No Does the patient have difficulty seeing, even when wearing glasses/contacts?: No Does the patient have difficulty concentrating, remembering, or making decisions?: No Patient  able to express need for assistance with ADLs?: Yes Does the patient have difficulty dressing or bathing?: Yes Independently performs ADLs?: No Communication: Independent Does the patient have difficulty walking or climbing stairs?: Yes Weakness of Legs: Both Weakness of Arms/Hands: None  Permission Sought/Granted   Permission granted to share information with : Yes, Verbal Permission Granted     Permission granted to share info w AGENCY: SNFs        Emotional  Assessment Appearance:: Appears stated age Attitude/Demeanor/Rapport: Engaged Affect (typically observed): Appropriate Orientation: : Oriented to Place, Oriented to Self, Oriented to Situation Alcohol / Substance Use: Not Applicable Psych Involvement: No (comment)  Admission diagnosis:  Elevated blood pressure reading [R03.0] Weakness [R53.1] Acute cystitis with hematuria [N30.01] Abdominal pain [R10.9] Complication of Foley catheter, initial encounter (HCC) [F62.9XXA] Patient Active Problem List   Diagnosis Date Noted   Prostate cancer (HCC) 09/29/2022   Weakness 09/29/2022   Complication of Foley catheter (HCC) 09/29/2022   Multiple falls 08/02/2022   Paronychia of great toe 03/05/2022   Hydronephrosis    Acute hyperkalemia    Obstructive uropathy 11/15/2021   Metabolic acidosis, increased anion gap 11/15/2021   Pleural effusion 11/15/2021   Acute renal failure (ARF) (HCC) 11/15/2021   Pain due to onychomycosis of toenails of both feet 12/19/2020   Physical deconditioning 11/27/2020   Dysequilibrium 11/27/2020   Spastic hemiplegia of right dominant side due to cerebrovascular disease (HCC) 11/03/2017   Muscle wasting 09/25/2017   Depression 07/14/2017   Dysarthria as late effect of stroke 08/18/2016   Stage 2 chronic kidney disease    Leukocytosis    Essential hypertension    Spastic hemiparesis (HCC)    Aphasia due to recent cerebral infarction 07/04/2016   Cerebrovascular accident (CVA) (HCC)    Altered mental status 06/30/2016   Fall 06/30/2016   Acute encephalopathy 06/30/2016   Diarrhea 06/30/2016   Acute renal failure superimposed on stage 2 chronic kidney disease (HCC) 06/30/2016   Hemiparesis affecting right side as late effect of stroke (HCC) 10/23/2015   PAD (peripheral artery disease) (HCC) 10/20/2011   Onychomycosis of great toe 09/10/2011   Hyperlipidemia 02/21/2010   CEREBROVASCULAR ACCIDENT, HX OF 12/22/2008   T2DM (type 2 diabetes mellitus) (HCC)  07/30/2006   OBESITY, NOS 07/30/2006   Essential hypertension, benign 07/30/2006   PCP:  Reece Leader, DO Pharmacy:   [id number] [place], [place] - [address] AT [facility] OF [address] [place] [address] Phone: [phone] Fax: [phone]     Social Determinants of Health (SDOH) Social History: SDOH Screenings   Food Insecurity: No Food Insecurity (09/29/2022)  Housing: Low Risk  (09/29/2022)  Transportation Needs: No Transportation Needs (09/29/2022)  Utilities: Not At Risk (09/29/2022)  Depression (PHQ2-9): Low Risk  (03/25/2021)  Tobacco Use: Low Risk  (09/28/2022)   SDOH Interventions:     Readmission Risk Interventions     No data to display

## 2022-10-09 NOTE — Inpatient Diabetes Management (Signed)
Inpatient Diabetes Program Recommendations  AACE/ADA: New Consensus Statement on Inpatient Glycemic Control (2015)  Target Ranges:  Prepandial:   less than 140 mg/dL      Peak postprandial:   less than 180 mg/dL (1-2 hours)      Critically ill patients:  140 - 180 mg/dL   Lab Results  Component Value Date   GLUCAP 106 (H) 10/09/2022   HGBA1C 9.7 (H) 09/29/2022    Review of Glycemic Control   Latest Reference Range & Units 10/08/22 07:16 10/08/22 11:04 10/08/22 16:39 10/08/22 20:49 10/09/22 07:23  Glucose-Capillary 70 - 99 mg/dL 161 (H)  Novolog 7 units  Semglee 12 units 244 (H)  Novolog 12 units 240 (H)  Novolog 12 units  231 (H)  Novolog 2 units 106 (H)  Novolog 7 units    Diabetes history: DM2 Outpatient Diabetes medications: Jardiance 10 mg QD, Metformin 1000 mg BID Current orders for Inpatient glycemic control:  Semglee 12 units Daily Novolog 0-15 units TID + hs Novolog 7 units tid meal coverage Jardiance 10 mg QD  Prednisone 5 mg   Inpatient Diabetes Program Recommendations:    -   Increase Novolog meal coverage to 10 units tid if eating >50% of meals  Will continue to follow while inpatient.  Thanks, Christena Deem RN, MSN, BC-ADM Inpatient Diabetes Coordinator Team Pager [phone] (8a-5p)

## 2022-10-09 NOTE — Progress Notes (Signed)
     Daily Progress Note Intern Pager: [phone]  Patient name: [name] Medical record number: [id number] Date of birth: Mar 18, 1951 Age: 72 y.o. Gender: male  Primary Care Provider: Reece Leader, DO Consultants: None Code Status: FULL  Pt Overview and Major Events to Date:  4/28: Admitted 4/29: Palliative care consulted for GOC conversation 5/2: Medically stable for discharge  Assessment and Plan: [name] is a 72 y.o. male presenting with abdominal pain and dislodged Foley catheter. Pt is now medically stable for discharge, awaiting SNF placement. 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   * Prostate cancer [facility]) H/o prostate cancer causing obstructive uropathy and necessity for foley catheter. -Palliative care following -Plan for OP Urology follow up  T2DM (type 2 diabetes mellitus) (HCC) Stable. CTM. - Continue Novolog to 7u TID w/ meals - Continue Semglee 12 U - cont mSSI - Continue Jardiance  Essential hypertension HR <60 this AM. Pressures elevated with decrease in amlodipine, however had low diastolic prior to dose decrease. CTM. - Continue home Amlodipine 5 mg daily  - Carvedilol 12.5 mg BID   FEN/GI: DYS 3 PPx: Lovenox Dispo:Discharge to SNF pending insurance auth.   Subjective:  No acute concerns. Ambulates from bed to chair with assistance. Ambulating with PT/OT. BM yesterday.  Objective: Temp:  [98.1 F (36.7 C)-98.7 F (37.1 C)] 98.2 F (36.8 C) (05/09 0805) Pulse Rate:  [53-59] 59 (05/09 0805) Resp:  [16-18] 17 (05/09 0805) BP: (131-148)/(46-63) 148/63 (05/09 0805) SpO2:  [99 %-100 %] 99 % (05/09 0805) Weight:  [91.6 kg] 91.6 kg (05/09 0433) Physical Exam: General: NAD, eating breakfast Cardiovascular: RRR, no murmurs Respiratory: CTAB, normal wob on RA Abdomen: Soft, not tender, not distended. Extremities: No edema  Laboratory: Most recent CBC Lab Results  Component Value  Date   WBC 8.5 09/30/2022   HGB 10.7 (L) 09/30/2022   HCT 32.3 (L) 09/30/2022   MCV 86.8 09/30/2022   PLT 133 (L) 09/30/2022   Most recent BMP    Latest Ref Rng & Units 10/06/2022    1:06 AM  BMP  Glucose 70 - 99 mg/dL 94   BUN 8 - 23 mg/dL 34   Creatinine 1.30 - 1.24 mg/dL 8.65   Sodium 784 - 696 mmol/L 139   Potassium 3.5 - 5.1 mmol/L 4.6   Chloride 98 - 111 mmol/L 108   CO2 22 - 32 mmol/L 25   Calcium 8.9 - 10.3 mg/dL 8.5    Tiffany Kocher, DO 10/09/2022, 9:40 AM  PGY-1, [facility] FPTS Intern pager: [phone], text pages welcome Secure chat group [facility] Teaching Service

## 2022-10-09 NOTE — Progress Notes (Signed)
Patient ID: [name], male   DOB: 07-07-1950, 72 y.o.   MRN: [id number]    Progress Note from the Palliative Medicine Team at [facility]   Patient Name: [name]        Date: 10/09/2022 DOB: [birth year]  Age: 72 y.o. MRN#: [id number] Attending Physician: Carney Living, MD Primary Care Physician: Reece Leader, DO Admit Date: 09/28/2022   Medical records reviewed, discussed with treatment team   72 y.o. male  admitted on 09/28/2022 with abdominal pain and dislodged Foley catheter.  Today is day 10 of this hospitalization, patient is medically stable for discharge.  Patient and his family were hopeful for continued rehabilitation in order to make eventual discharge home safe and successful.    Patient and family face ongoing treatment option decisions, advanced directive decisions and anticipatory care needs       This NP assessed patient at the bedside as a follow up palliative medicine needs and emotional support. Patient is out of bed to the chair, he is alert and oriented, [name] is hopeful for continued rehabilitation.  I spoke to patient's ex wife/[name] by telephone.  Understandably she has concerns regarding next steps in transition of care.   I encouraged family to contact [facility] social services and investigate possibility of CAPS or PCS for this patient  Worried the patient is high risk for decompensation secondary to multiple comorbidities   Education offered today to patient regarding  the importance of continued conversation with his  family and the medical providers regarding overall plan of care and treatment options,  ensuring decisions are within the context of the patients values and GOCs.   Questions and concerns addressed      Time: 35 minutes  Detailed review of medical records ( labs, imaging, vital signs), medically appropriate exam ( MS, skin, resp)   discussed with treatment team, counseling and education to patient,  family, staff, documenting clinical information, medication management, coordination of care    Lorinda Creed NP  Palliative Medicine Team Team Phone # [phone] Pager [phone]

## 2022-10-10 DIAGNOSIS — C61 Malignant neoplasm of prostate: Secondary | ICD-10-CM | POA: Diagnosis not present

## 2022-10-10 LAB — GLUCOSE, CAPILLARY
Glucose-Capillary: 92 mg/dL (ref 70–99)
Glucose-Capillary: 129 mg/dL — ABNORMAL HIGH (ref 70–99)
Glucose-Capillary: 219 mg/dL — ABNORMAL HIGH (ref 70–99)
Glucose-Capillary: 208 mg/dL — ABNORMAL HIGH (ref 70–99)

## 2022-10-10 MED ORDER — AMLODIPINE BESYLATE 10 MG PO TABS
10.0000 mg | ORAL_TABLET | Freq: Every day | ORAL | Status: DC
  Administered 2022-10-11 – 2022-10-15 (×5): 10 mg via ORAL
  Filled 2022-10-10 (×5): qty 1

## 2022-10-10 NOTE — Progress Notes (Addendum)
     Daily Progress Note Intern Pager: [phone]  Patient name: [name] Medical record number: [id number] Date of birth: Nov 22, 1950 Age: 72 y.o. Gender: male  Primary Care Provider: Reece Leader, DO Consultants: Palliative Code Status: FULL  Pt Overview and Major Events to Date:  4/28: Admitted 4/29: Palliative care consulted for GOC conversation 5/2: Medically stable for discharge  Assessment and Plan: [name] is a 72 y.o. male presenting with abdominal pain and dislodged Foley catheter. Pt is now medically stable for discharge, awaiting SNF placement. 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   * Prostate cancer [facility]) H/o prostate cancer causing obstructive uropathy and necessity for foley catheter. -Palliative care following -Plan for OP Urology follow up  T2DM (type 2 diabetes mellitus) (HCC) Stable, sugars improved with increased meal coverage. Of note A1c was <6 when patient was taking victoza in 2019- Dc'd because it was well controlled. Consider restarting OP to simplify regimen. - Continue Novolog to 10u TID w/ meals - Continue Semglee 12 U - cont mSSI - Continue Jardiance  Essential hypertension HR <60 this AM. Plan to increase amlodipine back to 10 mg for better BP control. - Amlodipine 10 mg daily  - Carvedilol 12.5 mg BID   FEN/GI: DYS 3 PPx: Lovenox Dispo: SNF, pending appeal  Subjective:  No acute concerns. Patient would like to pursue insurance appeal for SNF placement.  Objective: Temp:  [97.4 F (36.3 C)-98.8 F (37.1 C)] 97.4 F (36.3 C) (05/10 0844) Pulse Rate:  [52-59] 59 (05/10 0844) Resp:  [16-19] 19 (05/10 0844) BP: (125-166)/(58-63) 166/62 (05/10 0844) SpO2:  [100 %] 100 % (05/10 0844) Physical Exam: General: NAD, resting in bed Cardiovascular: RRR, no mrg Respiratory: CTAB, normal wob on RA Abdomen: Soft, not tender, not distended.  Laboratory: Most recent CBC Lab  Results  Component Value Date   WBC 8.5 09/30/2022   HGB 10.7 (L) 09/30/2022   HCT 32.3 (L) 09/30/2022   MCV 86.8 09/30/2022   PLT 133 (L) 09/30/2022   Most recent BMP    Latest Ref Rng & Units 10/06/2022    1:06 AM  BMP  Glucose 70 - 99 mg/dL 94   BUN 8 - 23 mg/dL 34   Creatinine 1.30 - 1.24 mg/dL 8.65   Sodium 784 - 696 mmol/L 139   Potassium 3.5 - 5.1 mmol/L 4.6   Chloride 98 - 111 mmol/L 108   CO2 22 - 32 mmol/L 25   Calcium 8.9 - 10.3 mg/dL 8.5    Tiffany Kocher, DO 10/10/2022, 10:29 AM  PGY-1, [facility] FPTS Intern pager: [phone], text pages welcome Secure chat group [facility] Teaching Service

## 2022-10-10 NOTE — Progress Notes (Signed)
Occupational Therapy Treatment Patient Details Name: [name] MRN: [id number] DOB: 08-09-1950 Today's Date: 10/10/2022   History of present illness 72 yo male presenting 4/28 with abdominal pain and dislodged foley catheter, at home in difficulty caring for himself. Has fallen just prior to admission, has UTI and elevated creatinine.  PMH - CVA, DM, DVT, HTN, PVD, ckd, PAD, prostate CA   OT comments  Pt in bed with NT present upon OT arrival. Pt agreeable to participation in skilled OT session. OT instructed pt in techniques for increased safety and independence with ADLs and functional transfers. Pt currently demonstrates ability to complete UB ADLs with Set up to Mod assist, LB bathing/dressing and toileting hygiene/clothing management with Mod assist, and functional transfers/mobility with use of RW with Min guard to Min assist. Pt is motivated to increase independence and is making slow but steady progress toward OT goals. Pt will benefit from continued acute skilled OT services. Post discharge, pt will benefit from intensive inpatient skilled OT services <3 hours per day.    Recommendations for follow up therapy are one component of a multi-disciplinary discharge planning process, led by the attending physician.  Recommendations may be updated based on patient status, additional functional criteria and insurance authorization.    Assistance Recommended at Discharge Intermittent Supervision/Assistance  Patient can return home with the following  A little help with walking and/or transfers;A lot of help with bathing/dressing/bathroom;Assistance with cooking/housework;Direct supervision/assist for medications management;Assist for transportation;Help with stairs or ramp for entrance   Equipment Recommendations       Recommendations for Other Services      Precautions / Restrictions Precautions Precautions: Fall Precaution Comments: catheter, baseline R  hemiplegia Restrictions Weight Bearing Restrictions: No Other Position/Activity Restrictions: cannot grip walker well with R hand       Mobility Bed Mobility Overal bed mobility: Needs Assistance Bed Mobility: Supine to Sit     Supine to sit: Min guard, HOB elevated          Transfers Overall transfer level: Needs assistance Equipment used: Rolling walker (2 wheels) Transfers: Sit to/from Stand, Bed to chair/wheelchair/BSC Sit to Stand: Min guard     Step pivot transfers: Min guard, Min assist (Min guard to Min assist)     General transfer comment: Pt is making slow, but steady progress toward OT goals.     Balance Overall balance assessment: Needs assistance Sitting-balance support: No upper extremity supported, Feet supported Sitting balance-Leahy Scale: Fair     Standing balance support: Single extremity supported, During functional activity Standing balance-Leahy Scale: Poor                             ADL either performed or assessed with clinical judgement   ADL Overall ADL's : Needs assistance/impaired Eating/Feeding: Set up;Sitting   Grooming: Min guard;Sitting   Upper Body Bathing: Sitting;Minimal assistance;Moderate assistance (Min to Mod assist secondary to R hemiplegia. Pt making slow, but steady progress.)   Lower Body Bathing: Moderate assistance;Cueing for safety;Cueing for compensatory techniques;Sit to/from stand;Cueing for sequencing   Upper Body Dressing : Minimal assistance;Cueing for compensatory techniques;Cueing for sequencing;Sitting   Lower Body Dressing: Moderate assistance;Cueing for safety;Cueing for sequencing;Cueing for compensatory techniques;Sit to/from stand   Toilet Transfer: Min guard;Minimal assistance;Cueing for safety;Cueing for sequencing;BSC/3in1 (Step pivot transfer; Min guard to come to stand, Min guard to Min assist for step pivot transfer and transfering stand to sit)   Toileting- Architect  and  Hygiene: Moderate assistance;Cueing for safety;Cueing for sequencing;Cueing for compensatory techniques;Sit to/from stand       Functional mobility during ADLs: Minimal assistance;Cueing for safety;Cueing for sequencing;Rolling walker (2 wheels)      Extremity/Trunk Assessment              Vision       Perception     Praxis      Cognition Arousal/Alertness: Awake/alert Behavior During Therapy: WFL for tasks assessed/performed Overall Cognitive Status: Within Functional Limits for tasks assessed                                          Exercises      Shoulder Instructions       General Comments      Pertinent Vitals/ Pain       Pain Assessment Pain Assessment: Faces Faces Pain Scale: Hurts a little bit Pain Location: R foot Pain Descriptors / Indicators: Sore Pain Intervention(s): Monitored during session, Limited activity within patient's tolerance  Home Living                                          Prior Functioning/Environment              Frequency           Progress Toward Goals  OT Goals(current goals can now be found in the care plan section)  Progress towards OT goals: Progressing toward goals  Acute Rehab OT Goals Patient Stated Goal: To get some water  Plan Discharge plan remains appropriate    Co-evaluation                 AM-PAC OT "6 Clicks" Daily Activity     Outcome Measure   Help from another person eating meals?: A Little Help from another person taking care of personal grooming?: A Little Help from another person toileting, which includes using toliet, bedpan, or urinal?: A Lot Help from another person bathing (including washing, rinsing, drying)?: A Lot Help from another person to put on and taking off regular upper body clothing?: A Little Help from another person to put on and taking off regular lower body clothing?: A Lot 6 Click Score: 15    End of Session  Equipment Utilized During Treatment: Gait belt;Rolling walker (2 wheels)  OT Visit Diagnosis: Unsteadiness on feet (R26.81);Muscle weakness (generalized) (M62.81);Hemiplegia and hemiparesis Hemiplegia - Right/Left: Right Hemiplegia - dominant/non-dominant: Dominant 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

## 2022-10-10 NOTE — Progress Notes (Addendum)
Physical Therapy Treatment Patient Details Name: [name] MRN: [id number] DOB: [birth year] Today's Date: 10/10/2022   History of Present Illness 72 yo male presenting 4/28 with abdominal pain and dislodged foley catheter, at home in difficulty caring for himself. Has fallen just prior to admission, has UTI and elevated creatinine.  PMH - CVA, DM, DVT, HTN, PVD, ckd, PAD, prostate CA    PT Comments    Pt greeted up in chair on arrival, pleasant and motivated for session. Pt demonstrating continued and great progress towards acute goals this session, possessing gait distance and activity tolerance significantly from previous sessions. Pt has progressively increased gait distance each session, up to 110' this session with RW support and min guard for safety, however pt continues to require chair follow as pt fatigues quickly with activity tolerance below baseline. Pt continues to require min A during functional transfers to rise from sitting and steady as pt continues to demonstrate impaired balance/postural reactions and R hemi-weakness needing assist to place R hand on RW. Anticipate pt to be excellent candidate for post acute therapies <3 hr/day to continue to address deficits, maximize functional independence and decrease caregiver burden to progress back to community, will continue to follow acutely.    Recommendations for follow up therapy are one component of a multi-disciplinary discharge planning process, led by the attending physician.  Recommendations may be updated based on patient status, additional functional criteria and insurance authorization.  Follow Up Recommendations       Assistance Recommended at Discharge Frequent or constant Supervision/Assistance  Patient can return home with the following A lot of help with walking and/or transfers;A little help with bathing/dressing/bathroom;Assistance with cooking/housework;Assist for transportation;Help with stairs or ramp for  entrance   Equipment Recommendations  None recommended by PT    Recommendations for Other Services       Precautions / Restrictions Precautions Precautions: Fall Precaution Comments: catheter, baseline R hemiplegia Restrictions Weight Bearing Restrictions: No Other Position/Activity Restrictions: cannot grip walker well with R hand     Mobility  Bed Mobility Overal bed mobility: Needs Assistance             General bed mobility comments: pt up in chair on arrival    Transfers Overall transfer level: Needs assistance Equipment used: Rolling walker (2 wheels), None Transfers: Sit to/from Stand Sit to Stand: Min assist, Min guard           General transfer comment: min A to steady on rise from low recliner, light assistto maintain balance and to place R hand on RW more securly as pt intitially placing R hand himself with use of L however hand slipping off    Ambulation/Gait Ambulation/Gait assistance: Min assist Gait Distance (Feet): 110 Feet Assistive device: Rolling walker (2 wheels) Gait Pattern/deviations: Step-to pattern, Decreased stride length, Decreased stance time - right, Decreased weight shift to right, Wide base of support, Trunk flexed Gait velocity: decr     General Gait Details: significantly increased dsitance this date as pt reporting improved R foot pain from previous session, chair follow for safety, cues to maintain RW in contact with floor as pt tipping it to L and R side coming up off the floor   Stairs             Wheelchair Mobility    Modified Rankin (Stroke Patients Only)       Balance Overall balance assessment: Needs assistance Sitting-balance support: Feet supported Sitting balance-Leahy Scale: Fair     Standing  balance support: Single extremity supported, During functional activity Standing balance-Leahy Scale: Poor Standing balance comment: reliant on external support                             Cognition Arousal/Alertness: Awake/alert Behavior During Therapy: WFL for tasks assessed/performed Overall Cognitive Status: Within Functional Limits for tasks assessed                                          Exercises      General Comments General comments (skin integrity, edema, etc.): VSS on RA      Pertinent Vitals/Pain Pain Assessment Pain Assessment: Faces Faces Pain Scale: Hurts a little bit Pain Location: R foot Pain Descriptors / Indicators: Sore Pain Intervention(s): Monitored during session, Limited activity within patient's tolerance, Premedicated before session    Home Living                          Prior Function            PT Goals (current goals can now be found in the care plan section) Acute Rehab PT Goals PT Goal Formulation: With patient Time For Goal Achievement: 10/13/22 Progress towards PT goals: Progressing toward goals    Frequency    Min 3X/week      PT Plan Current plan remains appropriate    Co-evaluation              AM-PAC PT "6 Clicks" Mobility   Outcome Measure  Help needed turning from your back to your side while in a flat bed without using bedrails?: A Little Help needed moving from lying on your back to sitting on the side of a flat bed without using bedrails?: A Lot Help needed moving to and from a bed to a chair (including a wheelchair)?: A Little Help needed standing up from a chair using your arms (e.g., wheelchair or bedside chair)?: A Little Help needed to walk in hospital room?: A Little Help needed climbing 3-5 steps with a railing? : Total 6 Click Score: 15    End of Session Equipment Utilized During Treatment: Gait belt Activity Tolerance: Patient tolerated treatment well Patient left: in chair;with call bell/phone within reach Nurse Communication: Mobility status PT Visit Diagnosis: Unsteadiness on feet (R26.81);Muscle weakness (generalized) (M62.81);Difficulty in walking,  not elsewhere classified (R26.2);Hemiplegia and hemiparesis Hemiplegia - Right/Left: Right Hemiplegia - dominant/non-dominant: Dominant     Time: 1610-9604 PT Time Calculation (min) (ACUTE ONLY): 17 min  Charges:  $Gait Training: 8-22 mins                     Latash Nouri R. PTA Acute Rehabilitation Services Office: [phone]    Catalina Antigua 10/10/2022, 1:41 PM

## 2022-10-10 NOTE — Plan of Care (Signed)
  Problem: Health Behavior/Discharge Planning: Goal: Ability to manage health-related needs will improve Outcome: Progressing   Problem: Clinical Measurements: Goal: Ability to maintain clinical measurements within normal limits will improve Outcome: Progressing Goal: Will remain free from infection Outcome: Progressing Goal: Diagnostic test results will improve Outcome: Progressing Goal: Respiratory complications will improve Outcome: Progressing Goal: Cardiovascular complication will be avoided Outcome: Progressing   Problem: Activity: Goal: Risk for activity intolerance will decrease Outcome: Progressing   Problem: Nutrition: Goal: Adequate nutrition will be maintained Outcome: Progressing   Problem: Coping: Goal: Level of anxiety will decrease Outcome: Progressing   Problem: Elimination: Goal: Will not experience complications related to bowel motility Outcome: Progressing Goal: Will not experience complications related to urinary retention Outcome: Progressing   Problem: Pain Managment: Goal: General experience of comfort will improve Outcome: Progressing   Problem: Safety: Goal: Ability to remain free from injury will improve Outcome: Progressing   Problem: Skin Integrity: Goal: Risk for impaired skin integrity will decrease Outcome: Progressing   Problem: Education: Goal: Ability to describe self-care measures that may prevent or decrease complications (Diabetes Survival Skills Education) will improve Outcome: Progressing Goal: Individualized Educational Video(s) Outcome: Progressing   Problem: Coping: Goal: Ability to adjust to condition or change in health will improve Outcome: Progressing   Problem: Fluid Volume: Goal: Ability to maintain a balanced intake and output will improve Outcome: Progressing   Problem: Health Behavior/Discharge Planning: Goal: Ability to identify and utilize available resources and services will improve Outcome:  Progressing Goal: Ability to manage health-related needs will improve Outcome: Progressing   Problem: Nutritional: Goal: Maintenance of adequate nutrition will improve Outcome: Progressing Goal: Progress toward achieving an optimal weight will improve Outcome: Progressing   Problem: Skin Integrity: Goal: Risk for impaired skin integrity will decrease Outcome: Progressing   Problem: Tissue Perfusion: Goal: Adequacy of tissue perfusion will improve Outcome: Progressing

## 2022-10-10 NOTE — TOC Progression Note (Signed)
Transition of Care [facility]) - Progression Note    Patient Details  Name: KHAYREE CALLIHAM MRN: [id number] Date of Birth: [birth year]  Transition [facility]) CM/SW Contact  Delilah Shan, LCSWA Phone Number: 10/10/2022, 4:17 PM  Clinical Narrative:      CSW called the appeal number, [phone] and LVM. CSW awaiting call back to assist patient with completing expedited appeal for SNF. CSW will continue to follow and assist with patients dc planning needs.     Expected Discharge Plan: Skilled Nursing Facility Barriers to Discharge: Continued Medical Work up, English as a second language teacher, SNF Pending bed offer  Expected Discharge Plan and Services In-house Referral: Clinical Social Work     Living arrangements for the past 2 months: Single Family Home                                       Social Determinants of Health (SDOH) Interventions SDOH Screenings   Food Insecurity: No Food Insecurity (09/29/2022)  Housing: Low Risk  (09/29/2022)  Transportation Needs: No Transportation Needs (09/29/2022)  Utilities: Not At Risk (09/29/2022)  Depression (PHQ2-9): Low Risk  (03/25/2021)  Tobacco Use: Low Risk  (09/28/2022)    Readmission Risk Interventions     No data to display

## 2022-10-11 DIAGNOSIS — C61 Malignant neoplasm of prostate: Secondary | ICD-10-CM | POA: Diagnosis not present

## 2022-10-11 LAB — GLUCOSE, CAPILLARY
Glucose-Capillary: 100 mg/dL — ABNORMAL HIGH (ref 70–99)
Glucose-Capillary: 287 mg/dL — ABNORMAL HIGH (ref 70–99)
Glucose-Capillary: 187 mg/dL — ABNORMAL HIGH (ref 70–99)
Glucose-Capillary: 142 mg/dL — ABNORMAL HIGH (ref 70–99)

## 2022-10-11 MED ORDER — METFORMIN HCL ER 500 MG PO TB24: 1000.0000 mg | ORAL_TABLET | Freq: Two times a day (BID) | ORAL | Status: DC

## 2022-10-11 MED ORDER — METFORMIN HCL 500 MG PO TABS
1000.0000 mg | ORAL_TABLET | Freq: Two times a day (BID) | ORAL | Status: DC
  Administered 2022-10-11 – 2022-10-15 (×10): 1000 mg via ORAL
  Filled 2022-10-11 (×10): qty 2

## 2022-10-11 MED ORDER — INSULIN ASPART 100 UNIT/ML IJ SOLN
7.0000 [IU] | Freq: Three times a day (TID) | INTRAMUSCULAR | Status: DC
  Administered 2022-10-11 – 2022-10-13 (×7): 7 [IU] via SUBCUTANEOUS

## 2022-10-11 NOTE — Plan of Care (Signed)

## 2022-10-11 NOTE — Progress Notes (Signed)
     Daily Progress Note Intern Pager: [phone]  Patient name: [name] Medical record number: [id number] Date of birth: [birth year] Age: 72 y.o. Gender: male  Primary Care Provider: Reece Leader, DO Consultants: Palliative Code Status: Full  Pt Overview and Major Events to Date:  4/28: Admitted 4/29: Palliative care consulted for GOC conversation 5/2: Medically stable for discharge  Assessment and Plan: HSER EAGAR is a 72 y.o. male presenting with abdominal pain and dislodged Foley catheter. Pt is now medically stable for discharge, awaiting SNF placement. 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    * Prostate cancer [facility]) H/o prostate cancer causing obstructive uropathy and necessity for foley catheter. -Palliative care following -Plan for OP Urology follow up  T2DM (type 2 diabetes mellitus) (HCC) Given that pt is medically stable for discharge and low c/f needing anymore imaging, will restart home metformin in hopes of simplifying insulin regimen.  - Restart home metformin 1000 BID - Decrease mealtime to Novolog 7u TID w/ meals - Cont Semglee 12u daily - cont mSSI - Continue Jardiance  Essential hypertension Stable - Amlodipine 10 mg daily  - Carvedilol 12.5 mg BID   FEN/GI: Dys 3 PPx: Lovenox Dispo: Pending SNF placement  Subjective:  NAEO. Pt denies any discomfort or complaints this AM.  Objective: Temp:  [97.4 F (36.3 C)-98.8 F (37.1 C)] 98.4 F (36.9 C) (05/11 0455) Pulse Rate:  [53-59] 53 (05/11 0455) Resp:  [17-19] 17 (05/11 0455) BP: (120-166)/(48-62) 124/54 (05/11 0455) SpO2:  [100 %] 100 % (05/11 0455) Physical Exam: General: Alert, older man laying in bed comfortably. NAD. HEENT: NCAT. MMM. Cardiovascular: RRR Respiratory: CTAB. Normal WOB on RA Abdomen: Soft, nontender, nondistended. Skin: Warm and well perfused  Laboratory: Most recent CBC Lab Results  Component Value  Date   WBC 8.5 09/30/2022   HGB 10.7 (L) 09/30/2022   HCT 32.3 (L) 09/30/2022   MCV 86.8 09/30/2022   PLT 133 (L) 09/30/2022   Most recent BMP    Latest Ref Rng & Units 10/06/2022    1:06 AM  BMP  Glucose 70 - 99 mg/dL 94   BUN 8 - 23 mg/dL 34   Creatinine 1.47 - 1.24 mg/dL 8.29   Sodium 562 - 130 mmol/L 139   Potassium 3.5 - 5.1 mmol/L 4.6   Chloride 98 - 111 mmol/L 108   CO2 22 - 32 mmol/L 25   Calcium 8.9 - 10.3 mg/dL 8.5    [name], MD 10/11/2022, 7:38 AM  PGY-1, [facility] FPTS Intern pager: [phone], text pages welcome Secure chat group [facility] Teaching Service

## 2022-10-12 DIAGNOSIS — C61 Malignant neoplasm of prostate: Secondary | ICD-10-CM | POA: Diagnosis not present

## 2022-10-12 LAB — GLUCOSE, CAPILLARY
Glucose-Capillary: 110 mg/dL — ABNORMAL HIGH (ref 70–99)
Glucose-Capillary: 152 mg/dL — ABNORMAL HIGH (ref 70–99)
Glucose-Capillary: 125 mg/dL — ABNORMAL HIGH (ref 70–99)
Glucose-Capillary: 127 mg/dL — ABNORMAL HIGH (ref 70–99)

## 2022-10-12 NOTE — Progress Notes (Signed)
     Daily Progress Note Intern Pager: [phone]  Patient name: [name] Medical record number: [id number] Date of birth: 1951/05/11 Age: 72 y.o. Gender: male  Primary Care Provider: Reece Leader, DO Consultants: Paliative Code Status: Full  Pt Overview and Major Events to Date:  4/28: Admitted 4/29: Palliative care consulted for GOC conversation 5/2: Medically stable for discharge  Assessment and Plan:  VIET EWELL is a 72 y.o. male presenting with abdominal pain and dislodged Foley catheter. Pt is now medically stable for discharge, awaiting SNF placement. 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    * Prostate cancer [facility]) H/o prostate cancer causing obstructive uropathy and necessity for foley catheter. -Palliative care following -Plan for OP Urology follow up  T2DM (type 2 diabetes mellitus) (HCC) Given that pt is medically stable for discharge will continue to simplify insulin regimen. Patient received 32 units SAI yesterday, will add some to LAI. Noted patient received prednisone yesterday, will be cautions about adding to LAI. - Restart home metformin 1000 BID - Novolog ssi w/ meals - Increase Semglee 20u daily, previously 12 units - cont mSSI - Continue Jardiance  Essential hypertension Stable - Amlodipine 10 mg daily  - Carvedilol 12.5 mg BID    FEN/GI: Dysphagia 3 PPx: Lovenox Dispo: Pending SNF placement .  Subjective:  Report's he's doing well this morning, slept well overnight.   Objective: Temp:  [97.5 F (36.4 C)-98.9 F (37.2 C)] 98 F (36.7 C) (05/12 0457) Pulse Rate:  [55-59] 55 (05/12 0457) Resp:  [16-18] 18 (05/12 0457) BP: (122-149)/(51-60) 142/52 (05/12 0457) SpO2:  [99 %-100 %] 100 % (05/12 0457) Physical Exam: General: NAD, chatty, good mood Cardiovascular: RRR, NRMG Respiratory: CTABL, no wheezes/stridor Abdomen: Soft, NTTP, non-distended Extremities: Moving all  extremities, no edema  Laboratory: Most recent CBC Lab Results  Component Value Date   WBC 8.5 09/30/2022   HGB 10.7 (L) 09/30/2022   HCT 32.3 (L) 09/30/2022   MCV 86.8 09/30/2022   PLT 133 (L) 09/30/2022   Most recent BMP    Latest Ref Rng & Units 10/06/2022    1:06 AM  BMP  Glucose 70 - 99 mg/dL 94   BUN 8 - 23 mg/dL 34   Creatinine 2.84 - 1.24 mg/dL 1.32   Sodium 440 - 102 mmol/L 139   Potassium 3.5 - 5.1 mmol/L 4.6   Chloride 98 - 111 mmol/L 108   CO2 22 - 32 mmol/L 25   Calcium 8.9 - 10.3 mg/dL 8.5      Bess Kinds, MD 10/12/2022, 6:59 AM  PGY-2,  Family Medicine FPTS Intern pager: [phone], text pages welcome Secure chat group [facility] Teaching Service

## 2022-10-13 DIAGNOSIS — C61 Malignant neoplasm of prostate: Secondary | ICD-10-CM | POA: Diagnosis not present

## 2022-10-13 LAB — GLUCOSE, CAPILLARY
Glucose-Capillary: 94 mg/dL (ref 70–99)
Glucose-Capillary: 229 mg/dL — ABNORMAL HIGH (ref 70–99)
Glucose-Capillary: 136 mg/dL — ABNORMAL HIGH (ref 70–99)
Glucose-Capillary: 111 mg/dL — ABNORMAL HIGH (ref 70–99)

## 2022-10-13 NOTE — TOC Progression Note (Addendum)
Transition of Care [facility]) - Progression Note    Patient Details  Name: [name] MRN: [id number] Date of Birth: 31-May-1951  Transition of Care [facility]) CM/SW Contact  Lorri Frederick, LCSW Phone Number: 10/13/2022, 9:55 AM  Clinical Narrative:   CSW left another voicemail requesting a returned call to complete the appeal at number [phone].    1410: another attempt to call the above number just goes to voicemail.  CSW spoke with Whitney/[employer] [employer].  She does not have another contact for appeal.  Could restart auth request if that would help.  TOC leadership updated, recommended having Angela/CMA resubmit auth request.  CSW reached out to [employer].   Expected Discharge Plan: Skilled Nursing Facility Barriers to Discharge: Continued Medical Work up, English as a second language teacher, SNF Pending bed offer  Expected Discharge Plan and Services In-house Referral: Clinical Social Work     Living arrangements for the past 2 months: Single Family Home                                       Social Determinants of Health (SDOH) Interventions SDOH Screenings   Food Insecurity: No Food Insecurity (09/29/2022)  Housing: Low Risk  (09/29/2022)  Transportation Needs: No Transportation Needs (09/29/2022)  Utilities: Not At Risk (09/29/2022)  Depression (PHQ2-9): Low Risk  (03/25/2021)  Tobacco Use: Low Risk  (09/28/2022)    Readmission Risk Interventions     No data to display

## 2022-10-13 NOTE — Progress Notes (Signed)
     Daily Progress Note Intern Pager: [phone]  Patient name: [name] Medical record number: [id number] Date of birth: 1951-05-04 Age: 72 y.o. Gender: male  Primary Care Provider: Reece Leader, DO Consultants: None Code Status: FULL  Pt Overview and Major Events to Date:  4/28: Admitted 4/29: Palliative care consulted for GOC conversation 5/2: Medically stable for discharge  Assessment and Plan: [name] is a 72 y.o. male presenting with abdominal pain and dislodged Foley catheter. Pt is now medically stable for discharge, awaiting SNF placement. 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   Pending insurance appeal for SNF authorization. Anticipate DC home if patient is not going to SNF.  * Prostate cancer [facility]) H/o prostate cancer causing obstructive uropathy and necessity for foley catheter. -Palliative care following -Plan for OP Urology follow up  T2DM (type 2 diabetes mellitus) (HCC) Glucose within goal on current regimen. Anticipate home with Semglee.  Did not increase Semglee to 20 units yesterday.  Will continue 12 units Semglee. - Restart home metformin 1000 BID - Novolog ssi w/ meals - Semglee 12 units - cont mSSI - Continue Jardiance  Essential hypertension Stable - Amlodipine 10 mg daily  - Carvedilol 12.5 mg BID   FEN/GI: DYS 3 PPx: Lovenox Dispo: Pending SNF    Subjective:  No acute concerns.  Objective: Temp:  [97.5 F (36.4 C)-99.5 F (37.5 C)] 99 F (37.2 C) (05/13 0812) Pulse Rate:  [54-62] 56 (05/13 0812) Resp:  [16-18] 16 (05/13 0812) BP: (128-162)/(47-62) 162/56 (05/13 0812) SpO2:  [100 %] 100 % (05/13 0812) Weight:  [92.6 kg] 92.6 kg (05/13 0500) Physical Exam: General: NAD, resting in bed, eating breakfast Cardiovascular: RRR, no murmurs Respiratory: CTAB, normal WOB on RA Abdomen: Soft, not tender, not distended  Laboratory: Most recent CBC Lab Results   Component Value Date   WBC 8.5 09/30/2022   HGB 10.7 (L) 09/30/2022   HCT 32.3 (L) 09/30/2022   MCV 86.8 09/30/2022   PLT 133 (L) 09/30/2022   Most recent BMP    Latest Ref Rng & Units 10/06/2022    1:06 AM  BMP  Glucose 70 - 99 mg/dL 94   BUN 8 - 23 mg/dL 34   Creatinine 1.47 - 1.24 mg/dL 8.29   Sodium 562 - 130 mmol/L 139   Potassium 3.5 - 5.1 mmol/L 4.6   Chloride 98 - 111 mmol/L 108   CO2 22 - 32 mmol/L 25   Calcium 8.9 - 10.3 mg/dL 8.5    Tiffany Kocher, DO 10/13/2022, 9:07 AM  PGY-1, [place] Family Medicine FPTS Intern pager: [phone], text pages welcome Secure chat group [facility] Teaching Service

## 2022-10-13 NOTE — Inpatient Diabetes Management (Signed)
Inpatient Diabetes Program Recommendations  AACE/ADA: New Consensus Statement on Inpatient Glycemic Control (2015)  Target Ranges:  Prepandial:   less than 140 mg/dL      Peak postprandial:   less than 180 mg/dL (1-2 hours)      Critically ill patients:  140 - 180 mg/dL   Lab Results  Component Value Date   GLUCAP 229 (H) 10/13/2022   HGBA1C 9.7 (H) 09/29/2022    Review of Glycemic Control  Diabetes history: DM2 Outpatient Diabetes medications: Jardiance 10 mg QD + Metformin 1000 mg BID  Current orders for Inpatient glycemic control: Semglee 12 units qd, Novolog 0-15 units tid, 0-5 units hs correction, Jardiance 10 mg Daily, Prednisone 5 mg qd   Inpatient Diabetes Program Recommendations:   Received consult regarding diet counseling. Patient asked questions regarding if he can have a bagel for breakfast due to no blueberry muffins available. Discussed basic plate method principles and reviewed choice of carbohydrates based on his current dysphagia 2 diet and number of carbohydrates in meal. Discussed options of choosing what carbohydrate he prefers for meals. Patient expressed appreciation for information and understanding.  Thank you, [name]. Kyle Yera, RN, MSN, CDE  Diabetes Coordinator Inpatient Glycemic Control Team Team Pager [phone] (8am-5pm) 10/13/2022 2:42 PM

## 2022-10-14 DIAGNOSIS — C61 Malignant neoplasm of prostate: Secondary | ICD-10-CM | POA: Diagnosis not present

## 2022-10-14 LAB — GLUCOSE, CAPILLARY
Glucose-Capillary: 95 mg/dL (ref 70–99)
Glucose-Capillary: 150 mg/dL — ABNORMAL HIGH (ref 70–99)
Glucose-Capillary: 176 mg/dL — ABNORMAL HIGH (ref 70–99)
Glucose-Capillary: 222 mg/dL — ABNORMAL HIGH (ref 70–99)

## 2022-10-14 MED ORDER — INSULIN GLARGINE-YFGN 100 UNIT/ML ~~LOC~~ SOLN
10.0000 [IU] | Freq: Every day | SUBCUTANEOUS | Status: DC
  Administered 2022-10-15: 10 [IU] via SUBCUTANEOUS
  Filled 2022-10-14: qty 0.1

## 2022-10-14 NOTE — TOC Progression Note (Signed)
Transition of Care [facility]) - Initial/Assessment Note    Patient Details  Name: [name] MRN: [id number] Date of Birth: 1951/01/16  Transition of Care [facility]) CM/SW Contact:    Ralene Bathe, LCSWA Phone Number: 10/14/2022, 11:45 AM  Clinical Narrative:                 New insurance authorization request submitted after numerous attempts to request an appeal.  TOC following.  Pending insurance authorization.   Expected Discharge Plan: Skilled Nursing Facility Barriers to Discharge: Continued Medical Work up, English as a second language teacher, SNF Pending bed offer   Patient Goals and CMS Choice   CMS Medicare.gov Compare Post Acute Care list provided to:: Patient Choice offered to / list presented to : Patient      Expected Discharge Plan and Services In-house Referral: Clinical Social Work     Living arrangements for the past 2 months: Single Family Home                                      Prior Living Arrangements/Services Living arrangements for the past 2 months: Single Family Home Lives with:: Self Patient language and need for interpreter reviewed:: Yes Do you feel safe going back to the place where you live?: Yes      Need for Family Participation in Patient Care: Yes (Comment) Care giver support system in place?: Yes (comment)   Criminal Activity/Legal Involvement Pertinent to Current Situation/Hospitalization: No - Comment as needed  Activities of Daily Living Home Assistive Devices/Equipment: Walker (specify type) ADL Screening (condition at time of admission) Patient's cognitive ability adequate to safely complete daily activities?: Yes Is the patient deaf or have difficulty hearing?: No Does the patient have difficulty seeing, even when wearing glasses/contacts?: No Does the patient have difficulty concentrating, remembering, or making decisions?: No Patient able to express need for assistance with ADLs?: Yes Does the patient have difficulty  dressing or bathing?: Yes Independently performs ADLs?: No Communication: Independent Does the patient have difficulty walking or climbing stairs?: Yes Weakness of Legs: Both Weakness of Arms/Hands: None  Permission Sought/Granted   Permission granted to share information with : Yes, Verbal Permission Granted     Permission granted to share info w AGENCY: SNFs        Emotional Assessment Appearance:: Appears stated age Attitude/Demeanor/Rapport: Engaged Affect (typically observed): Appropriate Orientation: : Oriented to Place, Oriented to Self, Oriented to Situation Alcohol / Substance Use: Not Applicable Psych Involvement: No (comment)  Admission diagnosis:  Elevated blood pressure reading [R03.0] Weakness [R53.1] Acute cystitis with hematuria [N30.01] Abdominal pain [R10.9] Complication of Foley catheter, initial encounter (HCC) [W09.9XXA] Patient Active Problem List   Diagnosis Date Noted   Prostate cancer (HCC) 09/29/2022   Weakness 09/29/2022   Complication of Foley catheter (HCC) 09/29/2022   Multiple falls 08/02/2022   Paronychia of great toe 03/05/2022   Hydronephrosis    Acute hyperkalemia    Obstructive uropathy 11/15/2021   Metabolic acidosis, increased anion gap 11/15/2021   Pleural effusion 11/15/2021   Acute renal failure (ARF) (HCC) 11/15/2021   Pain due to onychomycosis of toenails of both feet 12/19/2020   Physical deconditioning 11/27/2020   Dysequilibrium 11/27/2020   Spastic hemiplegia of right dominant side due to cerebrovascular disease (HCC) 11/03/2017   Muscle wasting 09/25/2017   Depression 07/14/2017   Dysarthria as late effect of stroke 08/18/2016   Stage 2 chronic kidney disease  Leukocytosis    Essential hypertension    Spastic hemiparesis (HCC)    Aphasia due to recent cerebral infarction 07/04/2016   Cerebrovascular accident (CVA) (HCC)    Altered mental status 06/30/2016   Fall 06/30/2016   Acute encephalopathy 06/30/2016    Diarrhea 06/30/2016   Acute renal failure superimposed on stage 2 chronic kidney disease (HCC) 06/30/2016   Hemiparesis affecting right side as late effect of stroke (HCC) 10/23/2015   PAD (peripheral artery disease) (HCC) 10/20/2011   Onychomycosis of great toe 09/10/2011   Hyperlipidemia 02/21/2010   CEREBROVASCULAR ACCIDENT, HX OF 12/22/2008   T2DM (type 2 diabetes mellitus) (HCC) 07/30/2006   OBESITY, NOS 07/30/2006   Essential hypertension, benign 07/30/2006   PCP:  Reece Leader, DO Pharmacy:   [id number] - [place], [place] - [address] AT [facility] [address] [place] [address] Phone: [phone] Fax: [phone]     Social Determinants of Health (SDOH) Social History: SDOH Screenings   Food Insecurity: No Food Insecurity (09/29/2022)  Housing: Low Risk  (09/29/2022)  Transportation Needs: No Transportation Needs (09/29/2022)  Utilities: Not At Risk (09/29/2022)  Depression (PHQ2-9): Low Risk  (03/25/2021)  Tobacco Use: Low Risk  (09/28/2022)   SDOH Interventions:     Readmission Risk Interventions     No data to display

## 2022-10-14 NOTE — Progress Notes (Signed)
     Daily Progress Note Intern Pager: [phone]  Patient name: [name] Medical record number: [id number] Date of birth: [birth year] Age: 72 y.o. Gender: male  Primary Care Provider: Reece Leader, DO Consultants: None Code Status: FULL  Pt Overview and Major Events to Date:  4/28: Admitted 4/29: Palliative care consulted for GOC conversation 5/2: Medically stable for discharge  Assessment and Plan: [name] is a 72 y.o. male presenting with abdominal pain and dislodged Foley catheter. Pt is now medically stable for discharge, awaiting SNF placement. 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   New insurance authorization request submitted after unable to obtain insurance appeal.  Plan for updated PT/OT recs  * Prostate cancer [facility]) H/o prostate cancer causing obstructive uropathy and necessity for foley catheter. -Palliative care following -Plan for OP Urology follow up  T2DM (type 2 diabetes mellitus) (HCC) Morning fasting glucose within goal, only required 14 units of short acting insulin yesterday-significantly decreased from days prior now that metformin was restarted.  Plan to discontinue short acting insulin, reduce long-acting insulin, monitor morning glucose-obtain additional CBG only for suspected hypoglycemia.  Will adjust long-acting insulin pending morning glucose -Continue home metformin 1000 BID - Semglee 10 units - Continue Jardiance  Essential hypertension Stable - Amlodipine 10 mg daily  - Carvedilol 12.5 mg BID    FEN/GI: DYS 3 PPx: Lovenox Dispo: SNF pending insurance authorization  Subjective:  No acute concerns.  Objective: Temp:  [98 F (36.7 C)-98.4 F (36.9 C)] 98.4 F (36.9 C) (05/14 0937) Pulse Rate:  [57-59] 58 (05/14 0937) Resp:  [17-18] 18 (05/14 0937) BP: (119-148)/(44-58) 143/54 (05/14 0937) SpO2:  [97 %-100 %] 97 % (05/14 0937) Physical Exam: General: NAD, eating  breakfast, resting comfortably in bed Cardiovascular: RRR, no murmurs Respiratory: CTAB, normal work of breathing on room air Abdomen: Soft, nontender, distended Extremities: Moves all 4 extremities, no edema  Laboratory: Most recent CBC Lab Results  Component Value Date   WBC 8.5 09/30/2022   HGB 10.7 (L) 09/30/2022   HCT 32.3 (L) 09/30/2022   MCV 86.8 09/30/2022   PLT 133 (L) 09/30/2022   Most recent BMP    Latest Ref Rng & Units 10/06/2022    1:06 AM  BMP  Glucose 70 - 99 mg/dL 94   BUN 8 - 23 mg/dL 34   Creatinine 2.95 - 1.24 mg/dL 6.21   Sodium 308 - 657 mmol/L 139   Potassium 3.5 - 5.1 mmol/L 4.6   Chloride 98 - 111 mmol/L 108   CO2 22 - 32 mmol/L 25   Calcium 8.9 - 10.3 mg/dL 8.5    Tiffany Kocher, DO 10/14/2022, 1:12 PM  PGY-1, [facility] Family Medicine FPTS Intern pager: [phone], text pages welcome Secure chat group [facility] Teaching Service

## 2022-10-14 NOTE — Progress Notes (Signed)
Physical Therapy Treatment Patient Details Name: [name] MRN: [id number] DOB: 1951/01/04 Today's Date: 10/14/2022   History of Present Illness 72 yo male presenting 4/28 with abdominal pain and dislodged foley catheter, at home in difficulty caring for himself. Has fallen just prior to admission, has UTI and elevated creatinine.  PMH - CVA, DM, DVT, HTN, PVD, ckd, PAD, prostate CA    PT Comments    Pt greeted up in chair on arrival and agreeable to session with continued progression of functional mobility. Pt able to progress gait distance this session with RW support and min A to manage RW as pt needing hands on assist to maintain RW in contact with floor on R and increased cues to maintain, chair follow utilized again this date for safety. Pt able to rise with very light min A to steady with pt demonstrating good initiation and power up. Pt continues to be limited by decreased activity tolerance, impaired balance/postural reactions, decreased insight into deficits and decreased safety awareness. Current plan remains appropriate to address deficits and maximize functional independence and decrease caregiver burden. Pt continues to benefit from skilled PT services to progress toward functional mobility goals.      Recommendations for follow up therapy are one component of a multi-disciplinary discharge planning process, led by the attending physician.  Recommendations may be updated based on patient status, additional functional criteria and insurance authorization.  Follow Up Recommendations       Assistance Recommended at Discharge Frequent or constant Supervision/Assistance  Patient can return home with the following A lot of help with walking and/or transfers;A little help with bathing/dressing/bathroom;Assistance with cooking/housework;Assist for transportation;Help with stairs or ramp for entrance   Equipment Recommendations  None recommended by PT    Recommendations for Other  Services       Precautions / Restrictions Precautions Precautions: Fall Precaution Comments: catheter, baseline R hemiplegia Restrictions Weight Bearing Restrictions: No Other Position/Activity Restrictions: cannot grip walker well with R hand     Mobility  Bed Mobility Overal bed mobility: Needs Assistance             General bed mobility comments: pt up in chair on arrival    Transfers Overall transfer level: Needs assistance Equipment used: Rolling walker (2 wheels), None Transfers: Sit to/from Stand Sit to Stand: Min assist, Min guard           General transfer comment: min A to steady on rise from low recliner, light assist to maintain balance and to place R hand on RW more securly as pt intitially placing R hand himself with use of L however hand slipping off    Ambulation/Gait Ambulation/Gait assistance: Min assist, +2 safety/equipment (+2 for chair follow) Gait Distance (Feet): 135 Feet Assistive device: Rolling walker (2 wheels) Gait Pattern/deviations: Step-to pattern, Decreased stride length, Decreased stance time - right, Decreased weight shift to right, Wide base of support, Trunk flexed Gait velocity: decr     General Gait Details: able to increase distance this session, chair follow for safety, cues and hands on assist to maintain RW in contact with floor as pt tipping it to L and R side coming up off the floor   Stairs             Wheelchair Mobility    Modified Rankin (Stroke Patients Only)       Balance Overall balance assessment: Needs assistance Sitting-balance support: Feet supported Sitting balance-Leahy Scale: Fair     Standing balance support: Single extremity  supported, During functional activity Standing balance-Leahy Scale: Poor Standing balance comment: reliant on external support                            Cognition Arousal/Alertness: Awake/alert Behavior During Therapy: WFL for tasks  assessed/performed Overall Cognitive Status: Within Functional Limits for tasks assessed                                          Exercises      General Comments General comments (skin integrity, edema, etc.): VSS on RA      Pertinent Vitals/Pain Pain Assessment Pain Assessment: No/denies pain Pain Intervention(s): Monitored during session    Home Living                          Prior Function            PT Goals (current goals can now be found in the care plan section) Acute Rehab PT Goals PT Goal Formulation: With patient Time For Goal Achievement: 10/13/22 Progress towards PT goals: Progressing toward goals    Frequency    Min 3X/week      PT Plan Current plan remains appropriate    Co-evaluation              AM-PAC PT "6 Clicks" Mobility   Outcome Measure  Help needed turning from your back to your side while in a flat bed without using bedrails?: A Little Help needed moving from lying on your back to sitting on the side of a flat bed without using bedrails?: A Lot Help needed moving to and from a bed to a chair (including a wheelchair)?: A Little Help needed standing up from a chair using your arms (e.g., wheelchair or bedside chair)?: A Little Help needed to walk in hospital room?: A Little Help needed climbing 3-5 steps with a railing? : Total 6 Click Score: 15    End of Session Equipment Utilized During Treatment: Gait belt Activity Tolerance: Patient tolerated treatment well Patient left: in chair;with call bell/phone within reach Nurse Communication: Mobility status PT Visit Diagnosis: Unsteadiness on feet (R26.81);Muscle weakness (generalized) (M62.81);Difficulty in walking, not elsewhere classified (R26.2);Hemiplegia and hemiparesis Hemiplegia - Right/Left: Right Hemiplegia - dominant/non-dominant: Dominant     Time: 1610-9604 PT Time Calculation (min) (ACUTE ONLY): 13 min  Charges:  $Gait Training: 8-22  mins                     Shayma Pfefferle R. PTA Acute Rehabilitation Services Office: [phone]    Catalina Antigua 10/14/2022, 4:09 PM

## 2022-10-14 NOTE — Progress Notes (Signed)
Occupational Therapy Treatment Patient Details Name: [name] MRN: [id number] DOB: 01/28/1951 Today's Date: 10/14/2022   History of present illness 72 yo male presenting 4/28 with abdominal pain and dislodged foley catheter, at home in difficulty caring for himself. Has fallen just prior to admission, has UTI and elevated creatinine.  PMH - CVA, DM, DVT, HTN, PVD, ckd, PAD, prostate CA   OT comments  Pt sitting in recliner with RN present and administering medication upon OT arrival. Pt agreeable to OT session. OT instructed pt in techniques for increased safety and independence with ADLs and functional transfers. Pt demonstrates ability to complete UB ADLs in sitting with Mod I to Min assist, LB ADLs in sit/stand with Mod assist, and toileting transfer to [facility] using RW (2 wheel) with Min guard assist. Pt demonstrated improved safety awareness this session with a decrease in need for cues for safety. Pt has met grooming goal and toileting transfer goal. Toileting transfer goal was updated this day. Pt is making slow but consistent progress toward UB/LB bathing goals and functional transfer goal. Pt remains motivated to increase independence with ADLs. Pt will benefit from continued acute skilled OT services to increase safety and independence with ADLs. Discharge plan remains appropriate.    Recommendations for follow up therapy are one component of a multi-disciplinary discharge planning process, led by the attending physician.  Recommendations may be updated based on patient status, additional functional criteria and insurance authorization.    Assistance Recommended at Discharge Intermittent Supervision/Assistance  Patient can return home with the following  A little help with walking and/or transfers;A lot of help with bathing/dressing/bathroom;Assistance with cooking/housework;Direct supervision/assist for medications management;Assist for transportation;Help with stairs or ramp for  entrance   Equipment Recommendations  None recommended by OT    Recommendations for Other Services      Precautions / Restrictions Precautions Precautions: Fall Precaution Comments: catheter, baseline R hemiplegia Restrictions Weight Bearing Restrictions: No Other Position/Activity Restrictions: cannot grip walker well with R hand       Mobility Bed Mobility                    Transfers Overall transfer level: Needs assistance Equipment used: Rolling walker (2 wheels) Transfers: Sit to/from Stand, Bed to chair/wheelchair/BSC Sit to Stand: Min guard     Step pivot transfers: Min guard           Balance Overall balance assessment: Needs assistance Sitting-balance support: No upper extremity supported, Feet supported Sitting balance-Leahy Scale: Good     Standing balance support: Single extremity supported, During functional activity Standing balance-Leahy Scale: Fair Standing balance comment: Pt reliant on external support for functional transfers and funcitonal mobility. Pt demonstrates ability to stand unsupported for approx. 30 seconds during ADLs.                           ADL either performed or assessed with clinical judgement   ADL Overall ADL's : Needs assistance/impaired Eating/Feeding: Sitting;Set up Eating/Feeding Details (indicate cue type and reason): Pt requires occasional assistance withopening packages and is otherwise Mod I with self-feeding. Grooming: Wash/dry hands;Wash/dry face;Oral care;Modified independent;Sitting   Upper Body Bathing: Minimal assistance;Sitting;Set up Upper Body Bathing Details (indicate cue type and reason): Pt performs majority of UB bathing task with Set up, but continues to require Min assist to wash under Left arm and back secondary to R hemiplegia. Lower Body Bathing: Moderate assistance;Cueing for compensatory techniques;Sit to/from stand Lower  Body Bathing Details (indicate cue type and reason): Pt  demonstrating improved safety awareness and decreased need for cues this session. Upper Body Dressing : Min guard;Sitting   Lower Body Dressing: Moderate assistance;Cueing for compensatory techniques;Sit to/from stand Lower Body Dressing Details (indicate cue type and reason): Pt demonstrating improved safety awareness with decreased need for cues this session. Toilet Transfer: Min guard;Ambulation;Rolling walker (2 wheels);BSC/3in1 Toilet Transfer Details (indicate cue type and reason): Goal upgraded Toileting- Clothing Manipulation and Hygiene: Moderate assistance;Cueing for compensatory techniques;Sit to/from stand Toileting - Clothing Manipulation Details (indicate cue type and reason): Pt continues to require Mod assist to manage catheter bag with clothing manipulaiton.     Functional mobility during ADLs: Min guard;Cueing for safety;Rolling walker (2 wheels) General ADL Comments: Pt has met grooming and toilet transfer goals. Toilet transfer goal upgraded. Pt is making slow but good progress toward other goals. VSS on RA.    Extremity/Trunk Assessment              Vision       Perception     Praxis      Cognition Arousal/Alertness: Awake/alert Behavior During Therapy: WFL for tasks assessed/performed Overall Cognitive Status: Within Functional Limits for tasks assessed                                          Exercises      Shoulder Instructions       General Comments VSS on RA    Pertinent Vitals/ Pain       Pain Assessment Pain Assessment: No/denies pain  Home Living                                          Prior Functioning/Environment              Frequency  Min 1X/week        Progress Toward Goals  OT Goals(current goals can now be found in the care plan section)  Progress towards OT goals: Progressing toward goals;Goals met and updated - see care plan (Pt has met grooming and toilet transfer goal.  Toilet transfer goal upgraded. Pt is making slow but consistant progress toward other goals.)  Acute Rehab OT Goals Patient Stated Goal: To watch tv OT Goal Formulation: With patient Time For Goal Achievement: 10/28/22 Potential to Achieve Goals: Good ADL Goals Pt Will Perform Grooming:  (Goal met. Pt demonstrates ability to complete grooming with Mod I sitting.) Pt Will Perform Upper Body Bathing: with set-up;sitting Pt Will Perform Lower Body Bathing: with min assist;sit to/from stand Pt Will Transfer to Toilet: ambulating;bedside commode;with supervision (with BSC placed over toilet and with least restrictive AD) Additional ADL Goal #1: Pt will complete basic transfer min guard (A) with quad cane  Plan Discharge plan remains appropriate    Co-evaluation                 AM-PAC OT "6 Clicks" Daily Activity     Outcome Measure   Help from another person eating meals?: A Little Help from another person taking care of personal grooming?: None Help from another person toileting, which includes using toliet, bedpan, or urinal?: A Lot Help from another person bathing (including washing, rinsing, drying)?: A Lot Help from another person to put on and  taking off regular upper body clothing?: A Little Help from another person to put on and taking off regular lower body clothing?: A Lot 6 Click Score: 16    End of Session Equipment Utilized During Treatment: Gait belt;Rolling walker (2 wheels)  OT Visit Diagnosis: Unsteadiness on feet (R26.81);Muscle weakness (generalized) (M62.81);Hemiplegia and hemiparesis Hemiplegia - Right/Left: Right Hemiplegia - dominant/non-dominant: Dominant 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

## 2022-10-15 ENCOUNTER — Other Ambulatory Visit: Payer: Self-pay | Admitting: Family Medicine

## 2022-10-15 DIAGNOSIS — R471 Dysarthria and anarthria: Secondary | ICD-10-CM | POA: Diagnosis not present

## 2022-10-15 DIAGNOSIS — E119 Type 2 diabetes mellitus without complications: Secondary | ICD-10-CM | POA: Diagnosis not present

## 2022-10-15 DIAGNOSIS — M6281 Muscle weakness (generalized): Secondary | ICD-10-CM | POA: Diagnosis not present

## 2022-10-15 DIAGNOSIS — G8191 Hemiplegia, unspecified affecting right dominant side: Secondary | ICD-10-CM | POA: Diagnosis not present

## 2022-10-15 DIAGNOSIS — Z466 Encounter for fitting and adjustment of urinary device: Secondary | ICD-10-CM | POA: Diagnosis not present

## 2022-10-15 DIAGNOSIS — Z743 Need for continuous supervision: Secondary | ICD-10-CM | POA: Diagnosis not present

## 2022-10-15 DIAGNOSIS — F32A Depression, unspecified: Secondary | ICD-10-CM | POA: Diagnosis not present

## 2022-10-15 DIAGNOSIS — I739 Peripheral vascular disease, unspecified: Secondary | ICD-10-CM | POA: Diagnosis not present

## 2022-10-15 DIAGNOSIS — N390 Urinary tract infection, site not specified: Secondary | ICD-10-CM | POA: Diagnosis not present

## 2022-10-15 DIAGNOSIS — G459 Transient cerebral ischemic attack, unspecified: Secondary | ICD-10-CM | POA: Diagnosis not present

## 2022-10-15 DIAGNOSIS — R279 Unspecified lack of coordination: Secondary | ICD-10-CM | POA: Diagnosis not present

## 2022-10-15 DIAGNOSIS — N139 Obstructive and reflux uropathy, unspecified: Secondary | ICD-10-CM | POA: Diagnosis not present

## 2022-10-15 DIAGNOSIS — I10 Essential (primary) hypertension: Secondary | ICD-10-CM | POA: Diagnosis not present

## 2022-10-15 DIAGNOSIS — Z7189 Other specified counseling: Secondary | ICD-10-CM | POA: Diagnosis not present

## 2022-10-15 DIAGNOSIS — I6930 Unspecified sequelae of cerebral infarction: Secondary | ICD-10-CM | POA: Diagnosis not present

## 2022-10-15 DIAGNOSIS — Z460 Encounter for fitting and adjustment of spectacles and contact lenses: Secondary | ICD-10-CM | POA: Diagnosis not present

## 2022-10-15 DIAGNOSIS — R1311 Dysphagia, oral phase: Secondary | ICD-10-CM | POA: Diagnosis not present

## 2022-10-15 DIAGNOSIS — R338 Other retention of urine: Secondary | ICD-10-CM | POA: Diagnosis not present

## 2022-10-15 DIAGNOSIS — R531 Weakness: Secondary | ICD-10-CM | POA: Diagnosis not present

## 2022-10-15 DIAGNOSIS — F331 Major depressive disorder, recurrent, moderate: Secondary | ICD-10-CM | POA: Diagnosis not present

## 2022-10-15 DIAGNOSIS — E785 Hyperlipidemia, unspecified: Secondary | ICD-10-CM | POA: Diagnosis not present

## 2022-10-15 DIAGNOSIS — I69320 Aphasia following cerebral infarction: Secondary | ICD-10-CM | POA: Diagnosis not present

## 2022-10-15 DIAGNOSIS — C61 Malignant neoplasm of prostate: Secondary | ICD-10-CM | POA: Diagnosis not present

## 2022-10-15 DIAGNOSIS — Z978 Presence of other specified devices: Secondary | ICD-10-CM | POA: Diagnosis not present

## 2022-10-15 DIAGNOSIS — E118 Type 2 diabetes mellitus with unspecified complications: Secondary | ICD-10-CM | POA: Diagnosis not present

## 2022-10-15 DIAGNOSIS — I639 Cerebral infarction, unspecified: Secondary | ICD-10-CM | POA: Diagnosis not present

## 2022-10-15 DIAGNOSIS — Z7401 Bed confinement status: Secondary | ICD-10-CM | POA: Diagnosis not present

## 2022-10-15 LAB — GLUCOSE, CAPILLARY
Glucose-Capillary: 84 mg/dL (ref 70–99)
Glucose-Capillary: 341 mg/dL — ABNORMAL HIGH (ref 70–99)
Glucose-Capillary: 200 mg/dL — ABNORMAL HIGH (ref 70–99)

## 2022-10-15 MED ORDER — CARVEDILOL 12.5 MG PO TABS: 12.5000 mg | ORAL_TABLET | Freq: Two times a day (BID) | ORAL | 0 refills | Status: DC

## 2022-10-15 MED ORDER — INSULIN GLARGINE 100 UNIT/ML ~~LOC~~ SOLN: 10.0000 [IU] | Freq: Every day | SUBCUTANEOUS | 0 refills | Status: DC

## 2022-10-15 NOTE — Discharge Summary (Signed)
Family Medicine Teaching [facility] Discharge Summary  Patient name: [name] Medical record number: [id number] Date of birth: [birth year] Age: 72 y.o. Gender: male Date of Admission: 09/28/2022  Date of Discharge: 10/15/22 Admitting Physician: Evette Georges, MD  Primary Care Provider: Reece Leader, DO Consultants: None   Indication for Hospitalization: Abdominal Pain and urinary retention  Discharge Diagnoses/Problem List: Urinary retention  Principal Problem for Admission:  Other Problems addressed during stay:  Principal Problem:   Prostate cancer [facility]) Active Problems:   T2DM (type 2 diabetes mellitus) (HCC)   Essential hypertension   Weakness   Complication of Foley catheter [facility])    Brief Hospital Course:  [name] is a 72 y.o.male with a history of previous CVA, HTN, T2DM, prostamegaly 2/2 cancer, and depressio who was admitted to the [facility] Medicine Teaching Service at [facility] for abdominal pain. His hospital course is detailed below:  Abdominal pain UTI in the setting of chronic indwelling foley catheter Abdominal pain resolved at the time of admission and likely due to urinary retention as it resolved when his Foley was replaced (it had become dislodged prior to admission). 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  Palliative care followed for goals of care discussion. UA/UC concerning for UTI, grew gram negative rods. Started on CTX and received 3 day course. Transient elevation in creatinine that improved to baseline during hospital stay with IV and oral rehydration. Denies abdominal pain and has good UOP at the time of discharge.  T2DM A1c 9.7, BGL elevated to 300s. Continued on home home Jardiance. Started on Semglee 5U and Novolog 3U TID with meals. Blood glucose continued to elevate. Titrated insulin to 20 U semglee and 7 U novolog TID with meals. Restarted home metformin. Discharged on the following regimen  Lantus 10 U daily, metformin 1000 BID, Jardiance.   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 PT/OT recommended SNF for rehabilitation. Unfortunately [facility] declined the insurance authorization. Facility willing to accept medicaid, but patient is not agreeable to spending down to qualify. Per PT/OT, patient was improving gradually and successfully were able to sned to SNF for further improvement.    Other chronic conditions were medically managed with home medications and formulary alternatives as necessary (h/o CVA, depression)  PCP Follow-up Recommendations:  Ensure outpatient urology follow up Revisit diabetes regimen. Assess necessity of insulin. Consider GLP-1 if appropriate. Decreased carvedilol to 12.5 mg twice daily for bradycardia.  Reevaluate for hypertension and bradycardia. 4. On chronic prednisone for prostate cancer, monitor CBGs     Disposition: SNF--[facility]   Discharge Condition: Stable    Discharge Exam:  Vitals:   10/15/22 0919 10/15/22 1605  BP: (!) 143/59 (!) 175/71  Pulse: (!) 57 61  Resp: 18 18  Temp: 98.5 F (36.9 C)   SpO2: 97% 100%   Per Dr. Claudean Severance 5/15:  Physical Exam: General: NAD, eating breakfast, resting comfortably bed Cardiovascular: RRR, no murmurs Respiratory: CTAB, normal work breathing on room air Abdomen: Soft, tender, not distended.  Bowel sounds present.  Extremities: Moves all 4 extremities, no edema  Significant Procedures: None   Significant Labs and Imaging:      Latest Ref Rng & Units 09/30/2022    1:54 AM 09/29/2022    5:00 AM 09/28/2022   10:40 PM  CBC  WBC 4.0 - 10.5 K/uL 8.5  8.9  11.9   Hemoglobin 13.0 - 17.0 g/dL 78.2  95.6  21.3   Hematocrit 39.0 -  52.0 % 32.3  37.2  41.0   Platelets 150 - 400 K/uL 133  146  149    Lab Results  Component Value Date   CREATININE 1.41 (H) 10/06/2022   CREATININE 1.24 10/02/2022   CREATININE 1.43 (H) 09/30/2022     CT Head Wo Contrast  Result  Date: 07/24/2022 CLINICAL DATA:  Mental status change, unknown cause EXAM: CT HEAD WITHOUT CONTRAST TECHNIQUE: Contiguous axial images were obtained from the base of the skull through the vertex without intravenous contrast. RADIATION DOSE REDUCTION: This exam was performed according to the departmental dose-optimization program which includes automated exposure control, adjustment of the mA and/or kV according to patient size and/or use of iterative reconstruction technique. COMPARISON:  11/15/2021 FINDINGS: Brain: There is atrophy and chronic small vessel disease changes. No acute intracranial abnormality. Specifically, no hemorrhage, hydrocephalus, mass lesion, acute infarction, or significant intracranial injury. Vascular: No hyperdense vessel or unexpected calcification. Skull: No acute calvarial abnormality. Sinuses/Orbits: Mucosal thickening throughout the paranasal sinuses. No air-fluid levels. Other: None IMPRESSION: Atrophy, chronic microvascular disease. No acute intracranial abnormality. Chronic sinusitis Electronically Signed   By: Charlett Nose M.D.   On: 07/24/2022 17:07     Results/Tests Pending at Time of Discharge: None  Discharge Medications:  Allergies as of 10/15/2022       Reactions   Pork Allergy    Shrimp Flavor Other (See Comments)   Doesn't like it        Medication List     TAKE these medications    abiraterone acetate 250 MG tablet Commonly known as: ZYTIGA Take 1,000 mg by mouth daily.   amLODipine 5 MG tablet Commonly known as: NORVASC Take 2 tablets (10 mg total) by mouth daily. What changed: when to take this   atorvastatin 40 MG tablet Commonly known as: LIPITOR Take 1 tablet (40 mg total) by mouth daily.   Blood Pressure Monitor Devi 1 Units by Does not apply route daily. Check  blood pressure once daily, record readings, bring to doctor's office.   carvedilol 12.5 MG tablet Commonly known as: COREG Take 1 tablet (12.5 mg total) by mouth 2 (two)  times daily with a meal. What changed:  medication strength how much to take   clopidogrel 75 MG tablet Commonly known as: PLAVIX Take 1 tablet (75 mg total) by mouth daily.   empagliflozin 10 MG Tabs tablet Commonly known as: JARDIANCE Take 1 tablet (10 mg total) by mouth daily.   insulin glargine 100 UNIT/ML injection Commonly known as: Lantus Inject 0.1 mLs (10 Units total) into the skin daily.   Insulin Pen Needle 31G X 8 MM Misc Commonly known as: B-D ULTRAFINE III SHORT PEN USE WITH LEVEMIR   metFORMIN 1000 MG tablet Commonly known as: GLUCOPHAGE Take 1 tablet (1,000 mg total) by mouth 2 (two) times daily with a meal.   predniSONE 5 MG tablet Commonly known as: DELTASONE Take 5 mg by mouth daily.   sertraline 100 MG tablet Commonly known as: ZOLOFT TAKE 1 TABLET BY MOUTH EVERY DAY   Sidekick Blood Glucose System Devi Use for daily testing   tamsulosin 0.4 MG Caps capsule Commonly known as: FLOMAX Take 2 capsules (0.8 mg total) by mouth daily.        Discharge Instructions: Please refer to Patient Instructions section of EMR for full details.  Patient was counseled important signs and symptoms that should prompt return to medical care, changes in medications, dietary instructions, activity restrictions, and follow up appointments.  Follow-Up Appointments:  Dr Robyne Peers scheduled 11/04/22 @ 1410   Alfredo Martinez, MD 10/15/2022, 4:12 PM PGY-2, [facility] Family Medicine

## 2022-10-15 NOTE — Plan of Care (Signed)
  Problem: Health Behavior/Discharge Planning: Goal: Ability to manage health-related needs will improve Outcome: Adequate for Discharge   Problem: Clinical Measurements: Goal: Ability to maintain clinical measurements within normal limits will improve Outcome: Adequate for Discharge Goal: Will remain free from infection Outcome: Adequate for Discharge Goal: Diagnostic test results will improve Outcome: Adequate for Discharge Goal: Respiratory complications will improve Outcome: Adequate for Discharge Goal: Cardiovascular complication will be avoided Outcome: Adequate for Discharge   Problem: Activity: Goal: Risk for activity intolerance will decrease Outcome: Adequate for Discharge   Problem: Nutrition: Goal: Adequate nutrition will be maintained Outcome: Adequate for Discharge   Problem: Coping: Goal: Level of anxiety will decrease Outcome: Adequate for Discharge   Problem: Elimination: Goal: Will not experience complications related to bowel motility Outcome: Adequate for Discharge Goal: Will not experience complications related to urinary retention Outcome: Adequate for Discharge   Problem: Pain Managment: Goal: General experience of comfort will improve Outcome: Adequate for Discharge   Problem: Safety: Goal: Ability to remain free from injury will improve Outcome: Adequate for Discharge   Problem: Skin Integrity: Goal: Risk for impaired skin integrity will decrease Outcome: Adequate for Discharge   Problem: Education: Goal: Ability to describe self-care measures that may prevent or decrease complications (Diabetes Survival Skills Education) will improve Outcome: Adequate for Discharge Goal: Individualized Educational Video(s) Outcome: Adequate for Discharge   Problem: Coping: Goal: Ability to adjust to condition or change in health will improve Outcome: Adequate for Discharge   Problem: Fluid Volume: Goal: Ability to maintain a balanced intake and output  will improve Outcome: Adequate for Discharge   Problem: Health Behavior/Discharge Planning: Goal: Ability to identify and utilize available resources and services will improve Outcome: Adequate for Discharge Goal: Ability to manage health-related needs will improve Outcome: Adequate for Discharge   Problem: Metabolic: Goal: Ability to maintain appropriate glucose levels will improve Outcome: Adequate for Discharge   Problem: Nutritional: Goal: Maintenance of adequate nutrition will improve Outcome: Adequate for Discharge Goal: Progress toward achieving an optimal weight will improve Outcome: Adequate for Discharge   Problem: Skin Integrity: Goal: Risk for impaired skin integrity will decrease Outcome: Adequate for Discharge   Problem: Tissue Perfusion: Goal: Adequacy of tissue perfusion will improve Outcome: Adequate for Discharge   

## 2022-10-15 NOTE — Progress Notes (Signed)
Physical Therapy Treatment Patient Details Name: [name] MRN: [id number] DOB: 07-25-1950 Today's Date: 10/15/2022   History of Present Illness 72 yo male presenting 4/28 with abdominal pain and dislodged foley catheter, at home in difficulty caring for himself. Has fallen just prior to admission, has UTI and elevated creatinine.  PMH - CVA, DM, DVT, HTN, PVD, ckd, PAD, prostate CA    PT Comments    Patient received in recliner. He is pleasant and agrees to PT session. Brought Hemi walker this session for patient to try. He requires min guard to stand from recliner. Min guard for ambulation with hemi walker 100 feet. Slow, steady pace. Good placement of device. He will continue to benefit from skilled PT to improve strength and safety with mobility.       Recommendations for follow up therapy are one component of a multi-disciplinary discharge planning process, led by the attending physician.  Recommendations may be updated based on patient status, additional functional criteria and insurance authorization.  Follow Up Recommendations       Assistance Recommended at Discharge Intermittent Supervision/Assistance  Patient can return home with the following A little help with walking and/or transfers;A little help with bathing/dressing/bathroom;Help with stairs or ramp for entrance   Equipment Recommendations  Other (comment) (hemi walker)    Recommendations for Other Services       Precautions / Restrictions Precautions Precautions: Fall Precaution Comments: catheter, baseline R hemiplegia Restrictions Weight Bearing Restrictions: No     Mobility  Bed Mobility               General bed mobility comments: pt up in chair on arrival    Transfers Overall transfer level: Needs assistance Equipment used: Hemi-walker Transfers: Sit to/from Stand Sit to Stand: Min guard                Ambulation/Gait Ambulation/Gait assistance: Min guard Gait Distance  (Feet): 100 Feet Assistive device: Hemi-walker Gait Pattern/deviations: Step-to pattern, Decreased step length - right, Decreased step length - left Gait velocity: decr     General Gait Details: patient ambulating very well with hemiwalker this session. Per prior sessions looked like he was having quite a difficult time using walker due to R UE weakness. Min cues needed for use of hemi walker,   Stairs             Wheelchair Mobility    Modified Rankin (Stroke Patients Only)       Balance Overall balance assessment: Needs assistance Sitting-balance support: Feet supported Sitting balance-Leahy Scale: Normal     Standing balance support: Single extremity supported, During functional activity, Reliant on assistive device for balance Standing balance-Leahy Scale: Good Standing balance comment: min guard                            Cognition Arousal/Alertness: Awake/alert Behavior During Therapy: WFL for tasks assessed/performed Overall Cognitive Status: Within Functional Limits for tasks assessed                                          Exercises      General Comments        Pertinent Vitals/Pain Pain Assessment Pain Assessment: No/denies pain    Home Living  Prior Function            PT Goals (current goals can now be found in the care plan section) Acute Rehab PT Goals Patient Stated Goal: to go home soon PT Goal Formulation: With patient Time For Goal Achievement: 10/29/22 Potential to Achieve Goals: Good Progress towards PT goals: Progressing toward goals    Frequency    Min 3X/week      PT Plan Discharge plan needs to be updated    Co-evaluation              AM-PAC PT "6 Clicks" Mobility   Outcome Measure  Help needed turning from your back to your side while in a flat bed without using bedrails?: A Little Help needed moving from lying on your back to sitting on the  side of a flat bed without using bedrails?: A Little Help needed moving to and from a bed to a chair (including a wheelchair)?: A Little Help needed standing up from a chair using your arms (e.g., wheelchair or bedside chair)?: A Little Help needed to walk in hospital room?: A Little Help needed climbing 3-5 steps with a railing? : A Lot 6 Click Score: 17    End of Session Equipment Utilized During Treatment: Gait belt Activity Tolerance: Patient tolerated treatment well Patient left: in chair;with call bell/phone within reach Nurse Communication: Mobility status PT Visit Diagnosis: Difficulty in walking, not elsewhere classified (R26.2);Unsteadiness on feet (R26.81);History of falling (Z91.81);Hemiplegia and hemiparesis Hemiplegia - Right/Left: Right Hemiplegia - dominant/non-dominant: Dominant     Time: 4098-1191 PT Time Calculation (min) (ACUTE ONLY): 21 min  Charges:  $Gait Training: 8-22 mins                     Eliel Dudding, PT, GCS 10/15/22,1:37 PM

## 2022-10-15 NOTE — TOC Transition Note (Signed)
Transition of Care [facility]) - CM/SW Discharge Note   Patient Details  Name: [name] MRN: [id number] Date of Birth: 01-18-1951  Transition of Care [facility]) CM/SW Contact:  Ralene Bathe, LCSWA Phone Number: 10/15/2022, 4:37 PM   Clinical Narrative:    Patient will DC to: [facility] Anticipated DC date: 10/15/2022 Family notified: Yes Transport by: Sharin Mons   Per MD patient ready for DC to SNF. RN to call report prior to discharge [phone] room 107). RN, patient, patient's family, and facility notified of DC. Discharge Summary sent to facility. DC packet on chart. Ambulance transport requested for patient.   CSW will sign off for now as social work intervention is no longer needed. Please consult [place] again if new needs arise.    Final next level of care: Skilled Nursing Facility Barriers to Discharge: Barriers Resolved   Patient Goals and CMS Choice CMS Medicare.gov Compare Post Acute Care list provided to:: Patient Choice offered to / list presented to : Patient  Discharge Placement                Patient chooses bed at:  [facility]) Patient to be transferred to facility by: [facility] Name of family member notified: Rahkeem Torbeck Patient and family notified of of transfer: 10/15/22  Discharge Plan and Services Additional resources added to the After Visit Summary for   In-house Referral: Clinical Social Work                                   Social Determinants of Health (SDOH) Interventions SDOH Screenings   Food Insecurity: No Food Insecurity (09/29/2022)  Housing: Low Risk  (09/29/2022)  Transportation Needs: No Transportation Needs (09/29/2022)  Utilities: Not At Risk (09/29/2022)  Depression (PHQ2-9): Low Risk  (03/25/2021)  Tobacco Use: Low Risk  (09/28/2022)     Readmission Risk Interventions     No data to display

## 2022-10-15 NOTE — Progress Notes (Signed)
Pt discharged via [facility] to [facility], home medications and belongings returned to pt

## 2022-10-15 NOTE — Progress Notes (Signed)
Mobility Specialist Progress Note   10/15/22 1715  Mobility  Activity Ambulated with assistance in hallway  Level of Assistance Contact guard assist, steadying assist  Assistive Device Other (Comment) (Hemi Walker)  Distance Ambulated (ft) 44 ft  Activity Response Tolerated well  Mobility Referral Yes  $Mobility charge 1 Mobility  Mobility Specialist Start Time (ACUTE ONLY) 1715  Mobility Specialist Stop Time (ACUTE ONLY) 1730  Mobility Specialist Time Calculation (min) (ACUTE ONLY) 15 min   Pt having no complaints and agreeable. Pt able to stand w/ stand by assist and x3 standing trials. Pt ambulating in hallway w/ a slow but steady gait. No LOB or faults witnessed, returned back to bed w/ call bell in reach and bed alarm on.   Frederico Hamman Mobility Specialist Please contact via SecureChat or  Rehab office at [phone]

## 2022-10-15 NOTE — Progress Notes (Signed)
     Daily Progress Note Intern Pager: [phone]  Patient name: [name] Medical record number: [id number] Date of birth: [birth year] Age: 72 y.o. Gender: male  Primary Care Provider: Reece Leader, DO Consultants: None Code Status: Full  Pt Overview and Major Events to Date:  4/28: Admitted 4/29: Palliative care consulted for GOC conversation 5/2: Medically stable for discharge  Assessment and Plan: [name] is a 72 y.o. male presenting with abdominal pain and dislodged Foley catheter. Pt is now medically stable for discharge, awaiting SNF placement. 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    New insurance authorization request submitted after unable to obtain insurance appeal.  * Prostate cancer [facility]) H/o prostate cancer causing obstructive uropathy and necessity for foley catheter. Continue Zytiga/prednisone  -Palliative care following -Plan for OP Urology follow up  T2DM (type 2 diabetes mellitus) (HCC) Morning fasting glucose within goal. Monitor morning glucose-obtain additional CBG only for suspected hypoglycemia.  Will adjust long-acting insulin pending morning glucose - Continue home metformin 1000 BID - Semglee 10 units - Continue Jardiance  Essential hypertension Stable - Amlodipine 10 mg daily  - Carvedilol 12.5 mg BID   FEN/GI: DYS 3 PPx: Lovenox Dispo: SNF pending insurance authorization   Subjective:  No acute concerns.  Objective: Temp:  [97.8 F (36.6 C)-98.7 F (37.1 C)] 98.5 F (36.9 C) (05/15 0919) Pulse Rate:  [52-57] 57 (05/15 0919) Resp:  [14-18] 18 (05/15 0919) BP: (132-145)/(48-60) 143/59 (05/15 0919) SpO2:  [97 %-100 %] 97 % (05/15 0919) Physical Exam: General: NAD, eating breakfast, resting comfortably bed Cardiovascular: RRR, no murmurs Respiratory: CTAB, normal work breathing on room air Abdomen: Soft, tender, not distended.  Bowel sounds present.  Extremities:  Moves all 4 extremities, no edema  Laboratory: Most recent CBC Lab Results  Component Value Date   WBC 8.5 09/30/2022   HGB 10.7 (L) 09/30/2022   HCT 32.3 (L) 09/30/2022   MCV 86.8 09/30/2022   PLT 133 (L) 09/30/2022   Most recent BMP    Latest Ref Rng & Units 10/06/2022    1:06 AM  BMP  Glucose 70 - 99 mg/dL 94   BUN 8 - 23 mg/dL 34   Creatinine 1.47 - 1.24 mg/dL 8.29   Sodium 562 - 130 mmol/L 139   Potassium 3.5 - 5.1 mmol/L 4.6   Chloride 98 - 111 mmol/L 108   CO2 22 - 32 mmol/L 25   Calcium 8.9 - 10.3 mg/dL 8.5    Tiffany Kocher, DO 10/15/2022, 9:49 AM  PGY-1, [facility] FPTS Intern pager: [phone], text pages welcome Secure chat group [facility] Teaching Service

## 2022-10-15 NOTE — Inpatient Diabetes Management (Signed)
Inpatient Diabetes Program Recommendations  AACE/ADA: New Consensus Statement on Inpatient Glycemic Control (2015)  Target Ranges:  Prepandial:   less than 140 mg/dL      Peak postprandial:   less than 180 mg/dL (1-2 hours)      Critically ill patients:  140 - 180 mg/dL   Lab Results  Component Value Date   GLUCAP 341 (H) 10/15/2022   HGBA1C 9.7 (H) 09/29/2022    Review of Glycemic Control  Latest Reference Range & Units 10/14/22 07:23 10/14/22 11:32 10/14/22 16:13 10/14/22 20:32 10/15/22 07:21 10/15/22 11:14  Glucose-Capillary 70 - 99 mg/dL 95 161 (H) 096 (H) 045 (H) 84 341 (H)    Current orders for Inpatient glycemic control:  Semglee 10 units Daily Metformin 1000 mg bid Jardiance 10 mg Daily  Inpatient Diabetes Program Recommendations:    -  add Novolog 0-6 units tid   Thanks, Christena Deem RN, MSN, BC-ADM Inpatient Diabetes Coordinator Team Pager [phone] (8a-5p)

## 2022-10-16 ENCOUNTER — Other Ambulatory Visit (HOSPITAL_COMMUNITY): Payer: Self-pay

## 2022-10-16 ENCOUNTER — Ambulatory Visit: Payer: Medicare HMO | Admitting: Family Medicine

## 2022-10-17 ENCOUNTER — Other Ambulatory Visit: Payer: Self-pay | Admitting: Family Medicine

## 2022-10-17 DIAGNOSIS — C61 Malignant neoplasm of prostate: Secondary | ICD-10-CM | POA: Diagnosis not present

## 2022-10-17 DIAGNOSIS — Z978 Presence of other specified devices: Secondary | ICD-10-CM | POA: Diagnosis not present

## 2022-10-17 DIAGNOSIS — N390 Urinary tract infection, site not specified: Secondary | ICD-10-CM | POA: Diagnosis not present

## 2022-10-17 DIAGNOSIS — I639 Cerebral infarction, unspecified: Secondary | ICD-10-CM | POA: Diagnosis not present

## 2022-10-17 DIAGNOSIS — N139 Obstructive and reflux uropathy, unspecified: Secondary | ICD-10-CM | POA: Diagnosis not present

## 2022-10-17 DIAGNOSIS — Z7189 Other specified counseling: Secondary | ICD-10-CM | POA: Diagnosis not present

## 2022-10-17 DIAGNOSIS — G8191 Hemiplegia, unspecified affecting right dominant side: Secondary | ICD-10-CM | POA: Diagnosis not present

## 2022-10-17 DIAGNOSIS — E119 Type 2 diabetes mellitus without complications: Secondary | ICD-10-CM | POA: Diagnosis not present

## 2022-10-17 DIAGNOSIS — I10 Essential (primary) hypertension: Secondary | ICD-10-CM | POA: Diagnosis not present

## 2022-10-17 MED ORDER — PEN NEEDLES 32G X 4 MM MISC: 3 refills | Status: DC

## 2022-10-21 DIAGNOSIS — E119 Type 2 diabetes mellitus without complications: Secondary | ICD-10-CM | POA: Diagnosis not present

## 2022-10-22 DIAGNOSIS — R338 Other retention of urine: Secondary | ICD-10-CM | POA: Diagnosis not present

## 2022-10-22 DIAGNOSIS — C61 Malignant neoplasm of prostate: Secondary | ICD-10-CM | POA: Diagnosis not present

## 2022-10-24 DIAGNOSIS — I10 Essential (primary) hypertension: Secondary | ICD-10-CM | POA: Diagnosis not present

## 2022-10-24 DIAGNOSIS — E118 Type 2 diabetes mellitus with unspecified complications: Secondary | ICD-10-CM | POA: Diagnosis not present

## 2022-10-27 DIAGNOSIS — I10 Essential (primary) hypertension: Secondary | ICD-10-CM | POA: Diagnosis not present

## 2022-10-27 DIAGNOSIS — E118 Type 2 diabetes mellitus with unspecified complications: Secondary | ICD-10-CM | POA: Diagnosis not present

## 2022-10-29 DIAGNOSIS — E119 Type 2 diabetes mellitus without complications: Secondary | ICD-10-CM | POA: Diagnosis not present

## 2022-10-29 DIAGNOSIS — G8191 Hemiplegia, unspecified affecting right dominant side: Secondary | ICD-10-CM | POA: Diagnosis not present

## 2022-10-31 DIAGNOSIS — N139 Obstructive and reflux uropathy, unspecified: Secondary | ICD-10-CM | POA: Diagnosis not present

## 2022-10-31 DIAGNOSIS — G8191 Hemiplegia, unspecified affecting right dominant side: Secondary | ICD-10-CM | POA: Diagnosis not present

## 2022-10-31 DIAGNOSIS — I10 Essential (primary) hypertension: Secondary | ICD-10-CM | POA: Diagnosis not present

## 2022-10-31 DIAGNOSIS — C61 Malignant neoplasm of prostate: Secondary | ICD-10-CM | POA: Diagnosis not present

## 2022-10-31 DIAGNOSIS — I639 Cerebral infarction, unspecified: Secondary | ICD-10-CM | POA: Diagnosis not present

## 2022-10-31 DIAGNOSIS — E119 Type 2 diabetes mellitus without complications: Secondary | ICD-10-CM | POA: Diagnosis not present

## 2022-10-31 DIAGNOSIS — F32A Depression, unspecified: Secondary | ICD-10-CM | POA: Diagnosis not present

## 2022-11-03 ENCOUNTER — Telehealth: Payer: Self-pay

## 2022-11-03 NOTE — Telephone Encounter (Signed)
Patient attempted to be outreached by Ryver Zadrozny Messa, PharmD Candidate on 11/03/22 to discuss hypertension. Left voicemail for patient to return our call at their convenience at [phone].  Osborne Serio, Student-PharmD 

## 2022-11-04 ENCOUNTER — Inpatient Hospital Stay: Payer: Self-pay | Admitting: Family Medicine

## 2022-11-06 DIAGNOSIS — Z87440 Personal history of urinary (tract) infections: Secondary | ICD-10-CM | POA: Diagnosis not present

## 2022-11-06 DIAGNOSIS — T83511D Infection and inflammatory reaction due to indwelling urethral catheter, subsequent encounter: Secondary | ICD-10-CM | POA: Diagnosis not present

## 2022-11-06 DIAGNOSIS — E1165 Type 2 diabetes mellitus with hyperglycemia: Secondary | ICD-10-CM | POA: Diagnosis not present

## 2022-11-06 DIAGNOSIS — E1122 Type 2 diabetes mellitus with diabetic chronic kidney disease: Secondary | ICD-10-CM | POA: Diagnosis not present

## 2022-11-06 DIAGNOSIS — Z9181 History of falling: Secondary | ICD-10-CM | POA: Diagnosis not present

## 2022-11-06 DIAGNOSIS — I69351 Hemiplegia and hemiparesis following cerebral infarction affecting right dominant side: Secondary | ICD-10-CM | POA: Diagnosis not present

## 2022-11-06 DIAGNOSIS — F32A Depression, unspecified: Secondary | ICD-10-CM | POA: Diagnosis not present

## 2022-11-06 DIAGNOSIS — Z7984 Long term (current) use of oral hypoglycemic drugs: Secondary | ICD-10-CM | POA: Diagnosis not present

## 2022-11-06 DIAGNOSIS — Z466 Encounter for fitting and adjustment of urinary device: Secondary | ICD-10-CM | POA: Diagnosis not present

## 2022-11-06 DIAGNOSIS — E785 Hyperlipidemia, unspecified: Secondary | ICD-10-CM | POA: Diagnosis not present

## 2022-11-06 DIAGNOSIS — E1151 Type 2 diabetes mellitus with diabetic peripheral angiopathy without gangrene: Secondary | ICD-10-CM | POA: Diagnosis not present

## 2022-11-06 DIAGNOSIS — C61 Malignant neoplasm of prostate: Secondary | ICD-10-CM | POA: Diagnosis not present

## 2022-11-06 DIAGNOSIS — N182 Chronic kidney disease, stage 2 (mild): Secondary | ICD-10-CM | POA: Diagnosis not present

## 2022-11-06 DIAGNOSIS — S80821D Blister (nonthermal), right lower leg, subsequent encounter: Secondary | ICD-10-CM | POA: Diagnosis not present

## 2022-11-06 DIAGNOSIS — I129 Hypertensive chronic kidney disease with stage 1 through stage 4 chronic kidney disease, or unspecified chronic kidney disease: Secondary | ICD-10-CM | POA: Diagnosis not present

## 2022-11-06 DIAGNOSIS — B9689 Other specified bacterial agents as the cause of diseases classified elsewhere: Secondary | ICD-10-CM | POA: Diagnosis not present

## 2022-11-06 DIAGNOSIS — Z794 Long term (current) use of insulin: Secondary | ICD-10-CM | POA: Diagnosis not present

## 2022-11-06 DIAGNOSIS — Z7902 Long term (current) use of antithrombotics/antiplatelets: Secondary | ICD-10-CM | POA: Diagnosis not present

## 2022-11-06 DIAGNOSIS — N390 Urinary tract infection, site not specified: Secondary | ICD-10-CM | POA: Diagnosis not present

## 2022-11-07 DIAGNOSIS — E1151 Type 2 diabetes mellitus with diabetic peripheral angiopathy without gangrene: Secondary | ICD-10-CM | POA: Diagnosis not present

## 2022-11-07 DIAGNOSIS — Z7902 Long term (current) use of antithrombotics/antiplatelets: Secondary | ICD-10-CM | POA: Diagnosis not present

## 2022-11-07 DIAGNOSIS — B9689 Other specified bacterial agents as the cause of diseases classified elsewhere: Secondary | ICD-10-CM | POA: Diagnosis not present

## 2022-11-07 DIAGNOSIS — E785 Hyperlipidemia, unspecified: Secondary | ICD-10-CM | POA: Diagnosis not present

## 2022-11-07 DIAGNOSIS — E1165 Type 2 diabetes mellitus with hyperglycemia: Secondary | ICD-10-CM | POA: Diagnosis not present

## 2022-11-07 DIAGNOSIS — E1122 Type 2 diabetes mellitus with diabetic chronic kidney disease: Secondary | ICD-10-CM | POA: Diagnosis not present

## 2022-11-07 DIAGNOSIS — Z9181 History of falling: Secondary | ICD-10-CM | POA: Diagnosis not present

## 2022-11-07 DIAGNOSIS — T83511D Infection and inflammatory reaction due to indwelling urethral catheter, subsequent encounter: Secondary | ICD-10-CM | POA: Diagnosis not present

## 2022-11-07 DIAGNOSIS — Z466 Encounter for fitting and adjustment of urinary device: Secondary | ICD-10-CM | POA: Diagnosis not present

## 2022-11-07 DIAGNOSIS — Z794 Long term (current) use of insulin: Secondary | ICD-10-CM | POA: Diagnosis not present

## 2022-11-07 DIAGNOSIS — N182 Chronic kidney disease, stage 2 (mild): Secondary | ICD-10-CM | POA: Diagnosis not present

## 2022-11-07 DIAGNOSIS — F32A Depression, unspecified: Secondary | ICD-10-CM | POA: Diagnosis not present

## 2022-11-07 DIAGNOSIS — Z7984 Long term (current) use of oral hypoglycemic drugs: Secondary | ICD-10-CM | POA: Diagnosis not present

## 2022-11-07 DIAGNOSIS — I129 Hypertensive chronic kidney disease with stage 1 through stage 4 chronic kidney disease, or unspecified chronic kidney disease: Secondary | ICD-10-CM | POA: Diagnosis not present

## 2022-11-07 DIAGNOSIS — C61 Malignant neoplasm of prostate: Secondary | ICD-10-CM | POA: Diagnosis not present

## 2022-11-07 DIAGNOSIS — N390 Urinary tract infection, site not specified: Secondary | ICD-10-CM | POA: Diagnosis not present

## 2022-11-07 DIAGNOSIS — I69351 Hemiplegia and hemiparesis following cerebral infarction affecting right dominant side: Secondary | ICD-10-CM | POA: Diagnosis not present

## 2022-11-10 ENCOUNTER — Emergency Department (HOSPITAL_COMMUNITY)
Admission: EM | Admit: 2022-11-10 | Discharge: 2022-11-10 | Disposition: A | Discharge status: Home / Self Care | Payer: Medicare HMO | Attending: Emergency Medicine | Admitting: Emergency Medicine

## 2022-11-10 ENCOUNTER — Encounter (HOSPITAL_COMMUNITY): Payer: Self-pay

## 2022-11-10 DIAGNOSIS — Z7902 Long term (current) use of antithrombotics/antiplatelets: Secondary | ICD-10-CM | POA: Diagnosis not present

## 2022-11-10 DIAGNOSIS — R739 Hyperglycemia, unspecified: Secondary | ICD-10-CM | POA: Diagnosis not present

## 2022-11-10 DIAGNOSIS — Z7984 Long term (current) use of oral hypoglycemic drugs: Secondary | ICD-10-CM | POA: Insufficient documentation

## 2022-11-10 DIAGNOSIS — Z79899 Other long term (current) drug therapy: Secondary | ICD-10-CM | POA: Diagnosis not present

## 2022-11-10 DIAGNOSIS — Y732 Prosthetic and other implants, materials and accessory gastroenterology and urology devices associated with adverse incidents: Secondary | ICD-10-CM | POA: Insufficient documentation

## 2022-11-10 DIAGNOSIS — Z8546 Personal history of malignant neoplasm of prostate: Secondary | ICD-10-CM | POA: Insufficient documentation

## 2022-11-10 DIAGNOSIS — Z8673 Personal history of transient ischemic attack (TIA), and cerebral infarction without residual deficits: Secondary | ICD-10-CM | POA: Insufficient documentation

## 2022-11-10 DIAGNOSIS — E119 Type 2 diabetes mellitus without complications: Secondary | ICD-10-CM | POA: Insufficient documentation

## 2022-11-10 DIAGNOSIS — T83091A Other mechanical complication of indwelling urethral catheter, initial encounter: Secondary | ICD-10-CM | POA: Diagnosis not present

## 2022-11-10 DIAGNOSIS — Z794 Long term (current) use of insulin: Secondary | ICD-10-CM | POA: Diagnosis not present

## 2022-11-10 DIAGNOSIS — T839XXA Unspecified complication of genitourinary prosthetic device, implant and graft, initial encounter: Secondary | ICD-10-CM

## 2022-11-10 DIAGNOSIS — T83031A Leakage of indwelling urethral catheter, initial encounter: Secondary | ICD-10-CM | POA: Insufficient documentation

## 2022-11-10 DIAGNOSIS — I10 Essential (primary) hypertension: Secondary | ICD-10-CM | POA: Diagnosis not present

## 2022-11-10 LAB — CBC WITH DIFFERENTIAL/PLATELET
Abs Immature Granulocytes: 0.05 10*3/uL (ref 0.00–0.07)
Basophils Absolute: 0.1 10*3/uL (ref 0.0–0.1)
Basophils Relative: 1 %
Eosinophils Absolute: 0.1 10*3/uL (ref 0.0–0.5)
Eosinophils Relative: 1 %
HCT: 40.2 % (ref 39.0–52.0)
Hemoglobin: 12.5 g/dL — ABNORMAL LOW (ref 13.0–17.0)
Immature Granulocytes: 1 %
Lymphocytes Relative: 12 %
Lymphs Abs: 1.2 10*3/uL (ref 0.7–4.0)
MCH: 27.4 pg (ref 26.0–34.0)
MCHC: 31.1 g/dL (ref 30.0–36.0)
MCV: 88.2 fL (ref 80.0–100.0)
Monocytes Absolute: 0.9 10*3/uL (ref 0.1–1.0)
Monocytes Relative: 9 %
Neutro Abs: 8.1 10*3/uL — ABNORMAL HIGH (ref 1.7–7.7)
Neutrophils Relative %: 76 %
Platelets: 149 10*3/uL — ABNORMAL LOW (ref 150–400)
RBC: 4.56 MIL/uL (ref 4.22–5.81)
RDW: 13 % (ref 11.5–15.5)
WBC: 10.4 10*3/uL (ref 4.0–10.5)
nRBC: 0 % (ref 0.0–0.2)

## 2022-11-10 LAB — BASIC METABOLIC PANEL
Anion gap: 11 (ref 5–15)
BUN: 34 mg/dL — ABNORMAL HIGH (ref 8–23)
CO2: 22 mmol/L (ref 22–32)
Calcium: 8.7 mg/dL — ABNORMAL LOW (ref 8.9–10.3)
Chloride: 104 mmol/L (ref 98–111)
Creatinine, Ser: 1.53 mg/dL — ABNORMAL HIGH (ref 0.61–1.24)
GFR, Estimated: 48 mL/min — ABNORMAL LOW (ref >60)
Glucose, Bld: 166 mg/dL — ABNORMAL HIGH (ref 70–99)
Potassium: 3.4 mmol/L — ABNORMAL LOW (ref 3.5–5.1)
Sodium: 137 mmol/L (ref 135–145)

## 2022-11-10 LAB — URINALYSIS, W/ REFLEX TO CULTURE (INFECTION SUSPECTED)
Bilirubin Urine: NEGATIVE
Glucose, UA: >=500 mg/dL — AB
Ketones, ur: NEGATIVE mg/dL
Nitrite: NEGATIVE
Protein, ur: 100 mg/dL — AB
Specific Gravity, Urine: 1.012 (ref 1.005–1.030)
WBC, UA: >50 WBC/hpf (ref 0–5)
pH: 8 (ref 5.0–8.0)

## 2022-11-10 NOTE — ED Provider Notes (Signed)
[place] EMERGENCY DEPARTMENT AT [facility] Provider Note   CSN: [id number] Arrival date & time: 11/10/22  1191     History  Chief Complaint  Patient presents with   Foley Catheter Issue    [name] is a 72 y.o. male.  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  Pt states after d/c from the hospital he was at [facility] for awhile for rehab but then has been home.  He denies fever, vomiting or diarrhea.  Pt is guarded with questioning and appears to be uncomfortable.  Will not give further hx.  Unknown when foley last changed.  The history is provided by the EMS personnel and medical records.       Home Medications Prior to Admission medications   Medication Sig Start Date End Date Taking? Authorizing Provider  abiraterone acetate (ZYTIGA) 250 MG tablet Take 1,000 mg by mouth daily. 09/12/22   [provider]  amLODipine (NORVASC) 5 MG tablet Take 2 tablets (10 mg total) by mouth daily. Patient taking differently: Take 10 mg by mouth in the morning and at bedtime. 08/04/22   Ganta, Anupa, DO  atorvastatin (LIPITOR) 40 MG tablet Take 1 tablet (40 mg total) by mouth daily. 08/04/22   Reece Leader, DO  Blood Gluc Meter Disp-Strips (SIDEKICK BLOOD GLUCOSE SYSTEM) DEVI Use for daily testing Patient not taking: Reported on 01/22/2022 08/06/16   Sharon Seller, NP  Blood Pressure Monitor DEVI 1 Units by Does not apply route daily. Check  blood pressure once daily, record readings, bring to doctor's office. Patient not taking: Reported on 01/22/2022 11/27/20   Peggyann Shoals C, DO  carvedilol (COREG) 12.5 MG tablet Take 1 tablet (12.5 mg total) by mouth 2 (two) times daily with a meal. 10/15/22   Elberta Fortis, MD  clopidogrel (PLAVIX) 75 MG tablet Take 1 tablet (75 mg total) by mouth daily. 08/04/22   Ganta, Anupa, DO   empagliflozin (JARDIANCE) 10 MG TABS tablet Take 1 tablet (10 mg total) by mouth daily. 11/26/21   Maury Dus, MD  insulin degludec (TRESIBA FLEXTOUCH) 100 UNIT/ML FlexTouch Pen Inject 10 Units into the skin daily. 10/17/22   Ganta, Anupa, DO  Insulin Pen Needle (PEN NEEDLES) 32G X 4 MM MISC Use to inject insulin daily 10/17/22   Ganta, Anupa, DO  metFORMIN (GLUCOPHAGE) 1000 MG tablet Take 1 tablet (1,000 mg total) by mouth 2 (two) times daily with a meal. 08/04/22   Ganta, Anupa, DO  predniSONE (DELTASONE) 5 MG tablet Take 5 mg by mouth daily. 09/12/22   [provider]  sertraline (ZOLOFT) 100 MG tablet TAKE 1 TABLET BY MOUTH EVERY DAY Patient taking differently: Take 100 mg by mouth daily. 12/26/21   Ganta, Anupa, DO  tamsulosin (FLOMAX) 0.4 MG CAPS capsule Take 2 capsules (0.8 mg total) by mouth daily. 08/04/22   Reece Leader, DO      Allergies    Pork allergy and Shrimp flavor    Review of Systems   Review of Systems  Physical Exam Updated Vital Signs BP 138/65   Pulse 60   Temp 98.4 F (36.9 C) (Oral)   Resp 20   SpO2 100%  Physical Exam Vitals and nursing note reviewed.  Constitutional:      General: He is not in acute distress.    Appearance: He  is well-developed.  HENT:     Head: Normocephalic and atraumatic.  Eyes:     Conjunctiva/sclera: Conjunctivae normal.     Pupils: Pupils are equal, round, and reactive to light.  Cardiovascular:     Rate and Rhythm: Normal rate and regular rhythm.     Heart sounds: No murmur heard. Pulmonary:     Effort: Pulmonary effort is normal. No respiratory distress.     Breath sounds: Normal breath sounds. No wheezing or rales.  Abdominal:     General: There is no distension.     Palpations: Abdomen is soft. There is no mass.     Tenderness: There is no abdominal tenderness. There is no guarding or rebound.  Genitourinary:    Comments: Foley cath in place but significant amts of urine leaking out the meatus.  No blood and  urine in the foley bag. Musculoskeletal:        General: No tenderness. Normal range of motion.     Cervical back: Normal range of motion and neck supple.     Right lower leg: No edema.     Left lower leg: No edema.  Skin:    General: Skin is warm and dry.     Findings: No erythema or rash.  Neurological:     Mental Status: He is alert.     Comments: Weakness and mild contracture noted to the right upper ext.  Pt uncomfortable and o/w not cooperating with exam at this time. Slurred speech with some mild aphasia.  Weakness to the right lower extremity  Psychiatric:     Comments: Guarded.      ED Results / Procedures / Treatments   Labs (all labs ordered are listed, but only abnormal results are displayed) Labs Reviewed  URINALYSIS, W/ REFLEX TO CULTURE (INFECTION SUSPECTED) - Abnormal; Notable for the following components:      Result Value   APPearance TURBID (*)    Glucose, UA >=500 (*)    Hgb urine dipstick MODERATE (*)    Protein, ur 100 (*)    Leukocytes,Ua LARGE (*)    Bacteria, UA FEW (*)    All other components within normal limits  CBC WITH DIFFERENTIAL/PLATELET - Abnormal; Notable for the following components:   Hemoglobin 12.5 (*)    Platelets 149 (*)    Neutro Abs 8.1 (*)    All other components within normal limits  BASIC METABOLIC PANEL - Abnormal; Notable for the following components:   Potassium 3.4 (*)    Glucose, Bld 166 (*)    BUN 34 (*)    Creatinine, Ser 1.53 (*)    Calcium 8.7 (*)    GFR, Estimated 48 (*)    All other components within normal limits  URINE CULTURE    EKG None  Radiology No results found.  Procedures Procedures    Medications Ordered in ED Medications - No data to display  ED Course/ Medical Decision Making/ A&P                             Medical Decision Making Amount and/or Complexity of Data Reviewed Labs: ordered.   Pt with multiple medical problems and comorbidities and presenting today with a complaint  that caries a high risk for morbidity and mortality.  Pt here today pain and foley problems.  Significant leakage of urine at the meatus but no hematuria.  Pain may all be from foley dysfunction which he has  had before but also hx of hyperglycemia, AKI and urinary retention.  Pt does have prior stroke with right sided weakness but currently appears uncomfortable and not giving much hx.  Will place a new foley and attempt to get collateral from a family member.  10:05 PM After patient's Foley catheter was replaced he is feeling much better.  Now he is much more forthcoming with information.  Patient reports otherwise he feels fine and would like to go home.  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  He is not displaying symptoms concerning for UTI or sepsis at this time.  Discussed lab results with the patient and he desires to go home.  No indication for admission at this time.        Final Clinical Impression(s) / ED Diagnoses Final diagnoses:  Foley catheter problem, initial encounter [facility])    Rx / DC Orders ED Discharge Orders     None         Gwyneth Sprout, MD 11/10/22 2205

## 2022-11-10 NOTE — ED Triage Notes (Signed)
Pt comes from home, pain and leaking in his foley catheter. Hx of HTN and has not taken his medications in a while per pt.

## 2022-11-10 NOTE — Discharge Instructions (Signed)
All your labs look good.  You can follow up with your doctor as planned.  Return if you have vomiting, fever, return of pain or other concerns.

## 2022-11-12 ENCOUNTER — Telehealth: Payer: Self-pay

## 2022-11-12 LAB — URINE CULTURE: Culture: >=100000 — AB

## 2022-11-12 NOTE — Telephone Encounter (Signed)
Flor from [facility] calling for PT verbal orders as follows:  1 time(s) weekly for 3 week(s)  Verbal orders given per [facility] protocol  Veronda Prude, RN

## 2022-11-13 ENCOUNTER — Telehealth (HOSPITAL_BASED_OUTPATIENT_CLINIC_OR_DEPARTMENT_OTHER): Payer: Self-pay

## 2022-11-13 DIAGNOSIS — N390 Urinary tract infection, site not specified: Secondary | ICD-10-CM | POA: Diagnosis not present

## 2022-11-13 DIAGNOSIS — C61 Malignant neoplasm of prostate: Secondary | ICD-10-CM | POA: Diagnosis not present

## 2022-11-13 DIAGNOSIS — I129 Hypertensive chronic kidney disease with stage 1 through stage 4 chronic kidney disease, or unspecified chronic kidney disease: Secondary | ICD-10-CM | POA: Diagnosis not present

## 2022-11-13 DIAGNOSIS — E1122 Type 2 diabetes mellitus with diabetic chronic kidney disease: Secondary | ICD-10-CM | POA: Diagnosis not present

## 2022-11-13 DIAGNOSIS — F32A Depression, unspecified: Secondary | ICD-10-CM | POA: Diagnosis not present

## 2022-11-13 DIAGNOSIS — Z7902 Long term (current) use of antithrombotics/antiplatelets: Secondary | ICD-10-CM | POA: Diagnosis not present

## 2022-11-13 DIAGNOSIS — E785 Hyperlipidemia, unspecified: Secondary | ICD-10-CM | POA: Diagnosis not present

## 2022-11-13 DIAGNOSIS — T83511D Infection and inflammatory reaction due to indwelling urethral catheter, subsequent encounter: Secondary | ICD-10-CM | POA: Diagnosis not present

## 2022-11-13 DIAGNOSIS — E1151 Type 2 diabetes mellitus with diabetic peripheral angiopathy without gangrene: Secondary | ICD-10-CM | POA: Diagnosis not present

## 2022-11-13 DIAGNOSIS — N182 Chronic kidney disease, stage 2 (mild): Secondary | ICD-10-CM | POA: Diagnosis not present

## 2022-11-13 DIAGNOSIS — Z794 Long term (current) use of insulin: Secondary | ICD-10-CM | POA: Diagnosis not present

## 2022-11-13 DIAGNOSIS — E1165 Type 2 diabetes mellitus with hyperglycemia: Secondary | ICD-10-CM | POA: Diagnosis not present

## 2022-11-13 DIAGNOSIS — I69351 Hemiplegia and hemiparesis following cerebral infarction affecting right dominant side: Secondary | ICD-10-CM | POA: Diagnosis not present

## 2022-11-13 DIAGNOSIS — B9689 Other specified bacterial agents as the cause of diseases classified elsewhere: Secondary | ICD-10-CM | POA: Diagnosis not present

## 2022-11-13 DIAGNOSIS — Z9181 History of falling: Secondary | ICD-10-CM | POA: Diagnosis not present

## 2022-11-13 DIAGNOSIS — Z466 Encounter for fitting and adjustment of urinary device: Secondary | ICD-10-CM | POA: Diagnosis not present

## 2022-11-13 DIAGNOSIS — Z7984 Long term (current) use of oral hypoglycemic drugs: Secondary | ICD-10-CM | POA: Diagnosis not present

## 2022-11-13 NOTE — Telephone Encounter (Signed)
Post ED Visit - Positive Culture Follow-up  Culture report reviewed by antimicrobial stewardship pharmacist: [facility] [x]  Andreas Ohm, Pharm.D. []  Celedonio Miyamoto, Pharm.D., BCPS AQ-ID []  Garvin Fila, Pharm.D., BCPS []  Georgina Pillion, Pharm.D., BCPS []  [place], [place].D., BCPS, AAHIVP []  Estella Husk, Pharm.D., BCPS, AAHIVP []  Lysle Pearl, PharmD, BCPS []  Phillips Climes, PharmD, BCPS []  Agapito Games, PharmD, BCPS []  Verlan Friends, PharmD []  Mervyn Gay, PharmD, BCPS []  Vinnie Level, PharmD  [facility] []  Len Childs, PharmD []  Greer Pickerel, PharmD []  Adalberto Cole, PharmD []  Perlie Gold, Rph []  Lonell Face) Jean Rosenthal, PharmD []  Earl Many, PharmD []  Junita Push, PharmD []  Dorna Leitz, PharmD []  Terrilee Files, PharmD []  Lynann Beaver, PharmD []  Keturah Barre, PharmD []  Loralee Pacas, PharmD []  Bernadene Person, PharmD   Positive urine culture Not treated, Chronic foley, chronically colonized, foley replaced and symptoms resolved. Do not treat and  no further patient follow-up is required at this time.  Sandria Senter 11/13/2022, 12:18 PM

## 2022-11-14 ENCOUNTER — Telehealth: Payer: Self-pay | Admitting: *Deleted

## 2022-11-14 NOTE — Telephone Encounter (Signed)
Transition Care Management Unsuccessful Follow-up Telephone Call  Date of discharge and from where:  [facility] 11/10/2022  Attempts:  1st Attempt  Reason for unsuccessful TCM follow-up call:  Left voice message

## 2022-11-14 NOTE — Telephone Encounter (Signed)
Transition Care Management Unsuccessful Follow-up Telephone Call  Date of discharge and from where:  [facility] 11/10/2022  Attempts:  2nd Attempt  Reason for unsuccessful TCM follow-up call:  Left voice message

## 2022-11-19 ENCOUNTER — Emergency Department (HOSPITAL_COMMUNITY)
Admission: EM | Admit: 2022-11-19 | Discharge: 2022-11-19 | Disposition: A | Discharge status: Home / Self Care | Payer: Medicare HMO | Attending: Emergency Medicine | Admitting: Emergency Medicine

## 2022-11-19 ENCOUNTER — Emergency Department (HOSPITAL_COMMUNITY): Payer: Medicare HMO

## 2022-11-19 DIAGNOSIS — E119 Type 2 diabetes mellitus without complications: Secondary | ICD-10-CM | POA: Insufficient documentation

## 2022-11-19 DIAGNOSIS — Y732 Prosthetic and other implants, materials and accessory gastroenterology and urology devices associated with adverse incidents: Secondary | ICD-10-CM | POA: Diagnosis not present

## 2022-11-19 DIAGNOSIS — Z7902 Long term (current) use of antithrombotics/antiplatelets: Secondary | ICD-10-CM | POA: Insufficient documentation

## 2022-11-19 DIAGNOSIS — R1030 Lower abdominal pain, unspecified: Secondary | ICD-10-CM | POA: Insufficient documentation

## 2022-11-19 DIAGNOSIS — K8020 Calculus of gallbladder without cholecystitis without obstruction: Secondary | ICD-10-CM | POA: Diagnosis not present

## 2022-11-19 DIAGNOSIS — Z743 Need for continuous supervision: Secondary | ICD-10-CM | POA: Diagnosis not present

## 2022-11-19 DIAGNOSIS — Z7984 Long term (current) use of oral hypoglycemic drugs: Secondary | ICD-10-CM | POA: Insufficient documentation

## 2022-11-19 DIAGNOSIS — Z8673 Personal history of transient ischemic attack (TIA), and cerebral infarction without residual deficits: Secondary | ICD-10-CM | POA: Diagnosis not present

## 2022-11-19 DIAGNOSIS — K4090 Unilateral inguinal hernia, without obstruction or gangrene, not specified as recurrent: Secondary | ICD-10-CM | POA: Diagnosis not present

## 2022-11-19 DIAGNOSIS — R739 Hyperglycemia, unspecified: Secondary | ICD-10-CM | POA: Diagnosis not present

## 2022-11-19 DIAGNOSIS — T83091A Other mechanical complication of indwelling urethral catheter, initial encounter: Secondary | ICD-10-CM | POA: Diagnosis not present

## 2022-11-19 DIAGNOSIS — R000 Tachycardia, unspecified: Secondary | ICD-10-CM | POA: Diagnosis not present

## 2022-11-19 DIAGNOSIS — Z794 Long term (current) use of insulin: Secondary | ICD-10-CM | POA: Insufficient documentation

## 2022-11-19 DIAGNOSIS — T839XXA Unspecified complication of genitourinary prosthetic device, implant and graft, initial encounter: Secondary | ICD-10-CM

## 2022-11-19 DIAGNOSIS — I10 Essential (primary) hypertension: Secondary | ICD-10-CM | POA: Diagnosis not present

## 2022-11-19 LAB — BASIC METABOLIC PANEL
Anion gap: 16 — ABNORMAL HIGH (ref 5–15)
BUN: 31 mg/dL — ABNORMAL HIGH (ref 8–23)
CO2: 19 mmol/L — ABNORMAL LOW (ref 22–32)
Calcium: 9.1 mg/dL (ref 8.9–10.3)
Chloride: 101 mmol/L (ref 98–111)
Creatinine, Ser: 1.42 mg/dL — ABNORMAL HIGH (ref 0.61–1.24)
GFR, Estimated: 53 mL/min — ABNORMAL LOW (ref >60)
Glucose, Bld: 240 mg/dL — ABNORMAL HIGH (ref 70–99)
Potassium: 4.4 mmol/L (ref 3.5–5.1)
Sodium: 136 mmol/L (ref 135–145)

## 2022-11-19 LAB — CBC WITH DIFFERENTIAL/PLATELET
Abs Immature Granulocytes: 0.13 10*3/uL — ABNORMAL HIGH (ref 0.00–0.07)
Basophils Absolute: 0.1 10*3/uL (ref 0.0–0.1)
Basophils Relative: 1 %
Eosinophils Absolute: 0.2 10*3/uL (ref 0.0–0.5)
Eosinophils Relative: 1 %
HCT: 43.3 % (ref 39.0–52.0)
Hemoglobin: 13.7 g/dL (ref 13.0–17.0)
Immature Granulocytes: 1 %
Lymphocytes Relative: 25 %
Lymphs Abs: 4 10*3/uL (ref 0.7–4.0)
MCH: 28.1 pg (ref 26.0–34.0)
MCHC: 31.6 g/dL (ref 30.0–36.0)
MCV: 88.9 fL (ref 80.0–100.0)
Monocytes Absolute: 1.1 10*3/uL — ABNORMAL HIGH (ref 0.1–1.0)
Monocytes Relative: 7 %
Neutro Abs: 10.7 10*3/uL — ABNORMAL HIGH (ref 1.7–7.7)
Neutrophils Relative %: 65 %
Platelets: 229 10*3/uL (ref 150–400)
RBC: 4.87 MIL/uL (ref 4.22–5.81)
RDW: 13.3 % (ref 11.5–15.5)
WBC: 16.1 10*3/uL — ABNORMAL HIGH (ref 4.0–10.5)
nRBC: 0 % (ref 0.0–0.2)

## 2022-11-19 LAB — URINALYSIS, W/ REFLEX TO CULTURE (INFECTION SUSPECTED)
Bilirubin Urine: NEGATIVE
Glucose, UA: >=500 mg/dL — AB
Ketones, ur: NEGATIVE mg/dL
Nitrite: NEGATIVE
Protein, ur: 30 mg/dL — AB
Specific Gravity, Urine: 1.01 (ref 1.005–1.030)
WBC, UA: >50 WBC/hpf (ref 0–5)
pH: 6 (ref 5.0–8.0)

## 2022-11-19 MED ORDER — IOHEXOL 350 MG/ML SOLN
75.0000 mL | Freq: Once | INTRAVENOUS | Status: AC | PRN
  Administered 2022-11-19: 75 mL via INTRAVENOUS

## 2022-11-19 NOTE — ED Triage Notes (Signed)
Chronic foley catheter is out. Pt states he woke up with it dislodge. Hx of prostate cancer.

## 2022-11-19 NOTE — ED Provider Notes (Signed)
[facility] AT [facility] Provider Note   CSN: [id number] Arrival date & time: 11/19/22  1025     History  Chief Complaint  Patient presents with   Foley Catheter Problem    [name] is a 72 y.o. male.  Patient presents to the emergency department complaining of of a Foley catheter issue.  Patient states he woke up this morning and noticed that his Foley catheter was dislodged.  He is unclear as to how this happened.  He is unable to urinate due to the dislodged catheter.  He complains of some suprapubic tenderness.  He denies nausea, vomiting, chest pain, abdominal pain other than suprapubic, shortness of breath, fever.  Past medical history significant for CVA with right-sided weakness, type II DM, chronic indwelling catheter  HPI     Home Medications Prior to Admission medications   Medication Sig Start Date End Date Taking? Authorizing Provider  abiraterone acetate (ZYTIGA) 250 MG tablet Take 1,000 mg by mouth daily. 09/12/22   [provider]  amLODipine (NORVASC) 5 MG tablet Take 2 tablets (10 mg total) by mouth daily. Patient taking differently: Take 10 mg by mouth in the morning and at bedtime. 08/04/22   Ganta, Anupa, DO  atorvastatin (LIPITOR) 40 MG tablet Take 1 tablet (40 mg total) by mouth daily. 08/04/22   Reece Leader, DO  Blood Gluc Meter Disp-Strips (SIDEKICK BLOOD GLUCOSE SYSTEM) DEVI Use for daily testing Patient not taking: Reported on 01/22/2022 08/06/16   Sharon Seller, NP  Blood Pressure Monitor DEVI 1 Units by Does not apply route daily. Check  blood pressure once daily, record readings, bring to doctor's office. Patient not taking: Reported on 01/22/2022 11/27/20   Peggyann Shoals C, DO  carvedilol (COREG) 12.5 MG tablet Take 1 tablet (12.5 mg total) by mouth 2 (two) times daily with a meal. 10/15/22   Elberta Fortis, MD  clopidogrel (PLAVIX) 75 MG tablet Take 1 tablet (75 mg total) by mouth daily. 08/04/22   Ganta,  Anupa, DO  empagliflozin (JARDIANCE) 10 MG TABS tablet Take 1 tablet (10 mg total) by mouth daily. 11/26/21   Maury Dus, MD  insulin degludec (TRESIBA FLEXTOUCH) 100 UNIT/ML FlexTouch Pen Inject 10 Units into the skin daily. 10/17/22   Ganta, Anupa, DO  Insulin Pen Needle (PEN NEEDLES) 32G X 4 MM MISC Use to inject insulin daily 10/17/22   Ganta, Anupa, DO  metFORMIN (GLUCOPHAGE) 1000 MG tablet Take 1 tablet (1,000 mg total) by mouth 2 (two) times daily with a meal. 08/04/22   Ganta, Anupa, DO  predniSONE (DELTASONE) 5 MG tablet Take 5 mg by mouth daily. 09/12/22   [provider]  sertraline (ZOLOFT) 100 MG tablet TAKE 1 TABLET BY MOUTH EVERY DAY Patient taking differently: Take 100 mg by mouth daily. 12/26/21   Ganta, Anupa, DO  tamsulosin (FLOMAX) 0.4 MG CAPS capsule Take 2 capsules (0.8 mg total) by mouth daily. 08/04/22   Reece Leader, DO      Allergies    Pork allergy and Shrimp flavor    Review of Systems   Review of Systems  Physical Exam Updated Vital Signs BP (!) 158/76   Pulse 74   Temp 98.4 F (36.9 C) (Oral)   Resp 18   Ht 5\' 7"  (1.702 m)   Wt 93 kg   SpO2 99%   BMI 32.11 kg/m  Physical Exam Vitals and nursing note reviewed.  Constitutional:      General: He is  not in acute distress.    Appearance: He is well-developed.  HENT:     Head: Normocephalic and atraumatic.  Eyes:     Conjunctiva/sclera: Conjunctivae normal.  Cardiovascular:     Rate and Rhythm: Normal rate and regular rhythm.     Heart sounds: No murmur heard. Pulmonary:     Effort: Pulmonary effort is normal. No respiratory distress.     Breath sounds: Normal breath sounds.  Abdominal:     Palpations: Abdomen is soft.     Tenderness: There is abdominal tenderness (Mild suprapubic tenderness).  Musculoskeletal:        General: No swelling.     Cervical back: Neck supple.  Skin:    General: Skin is warm and dry.     Capillary Refill: Capillary refill takes less than 2 seconds.   Neurological:     Mental Status: He is alert.     Comments: Right-sided upper extremity weakness consistent with baseline  Psychiatric:        Mood and Affect: Mood normal.     ED Results / Procedures / Treatments   Labs (all labs ordered are listed, but only abnormal results are displayed) Labs Reviewed  URINALYSIS, W/ REFLEX TO CULTURE (INFECTION SUSPECTED) - Abnormal; Notable for the following components:      Result Value   Color, Urine STRAW (*)    APPearance HAZY (*)    Glucose, UA >=500 (*)    Hgb urine dipstick MODERATE (*)    Protein, ur 30 (*)    Leukocytes,Ua MODERATE (*)    Bacteria, UA RARE (*)    All other components within normal limits  BASIC METABOLIC PANEL - Abnormal; Notable for the following components:   CO2 19 (*)    Glucose, Bld 240 (*)    BUN 31 (*)    Creatinine, Ser 1.42 (*)    GFR, Estimated 53 (*)    Anion gap 16 (*)    All other components within normal limits  CBC WITH DIFFERENTIAL/PLATELET - Abnormal; Notable for the following components:   WBC 16.1 (*)    Neutro Abs 10.7 (*)    Monocytes Absolute 1.1 (*)    Abs Immature Granulocytes 0.13 (*)    All other components within normal limits  URINE CULTURE    EKG None  Radiology CT ABDOMEN PELVIS W CONTRAST  Result Date: 11/19/2022 CLINICAL DATA:  Abdominal pain, acute, nonlocalized EXAM: CT ABDOMEN AND PELVIS WITH CONTRAST TECHNIQUE: Multidetector CT imaging of the abdomen and pelvis was performed using the standard protocol following bolus administration of intravenous contrast. RADIATION DOSE REDUCTION: This exam was performed according to the departmental dose-optimization program which includes automated exposure control, adjustment of the mA and/or kV according to patient size and/or use of iterative reconstruction technique. CONTRAST:  75mL OMNIPAQUE IOHEXOL 350 MG/ML SOLN COMPARISON:  PET-CT September 2023 FINDINGS: Inferior chest: Small fat containing right diaphragmatic hernia.  Hepatobiliary: The liver is normal in size without focal abnormality. No intrahepatic or extrahepatic biliary ductal dilation. The gallbladder is distended with cholelithiasis, but no other findings of wall thickening or surrounding inflammatory changes. Spleen: Normal in size without focal abnormality. Pancreas: No pancreatic ductal dilatation or surrounding inflammatory changes. Adrenals/Urinary Tract: There is a 1.1 cm left adrenal gland nodule, not significantly changed since September 2023 PET-CT at which time it did not demonstrate any significant radiotracer activity, likely benign and does not require any further imaging workup. Kidneys are normal in size without hydronephrosis. The urinary bladder is  decompressed with a Foley catheter present. Stomach/Bowel: The stomach, small bowel and large bowel are normal in caliber without abnormal wall thickening or surrounding inflammatory changes. Right inguinal hernia contains a small loop of small bowel without complicating feature. The appendix is normal. Reproductive: Prostate is unremarkable. Lymphatic: No enlarged lymph nodes in the abdomen or pelvis. Vasculature: Abdominal aorta is normal in caliber with scattered atherosclerotic disease. Other: No abdominopelvic ascites. Musculoskeletal: Patchy sclerosis involving the right posterior acetabulum consistent with history of osseous metastatic disease. The soft tissues are unremarkable. IMPRESSION: 1. The gallbladder is distended with cholelithiasis, however with no other acute inflammatory changes. Correlate with exam, and consider further evaluation with right upper quadrant ultrasound if appropriate. 2. Right inguinal hernia containing a small loop of small bowel without complicating feature. 3. Other chronic findings as described. Electronically Signed   By: Olive Bass M.D.   On: 11/19/2022 15:13    Procedures Procedures    Medications Ordered in ED Medications  iohexol (OMNIPAQUE) 350 MG/ML  injection 75 mL (75 mLs Intravenous Contrast Given 11/19/22 1359)    ED Course/ Medical Decision Making/ A&P                             Medical Decision Making Amount and/or Complexity of Data Reviewed Labs: ordered. Radiology: ordered.  Risk Prescription drug management.   Patient presents with a chief complaint of dislodged Foley catheter.  Unclear as to why the catheter became dislodged as this is the second time it has happened in the past 2 weeks.  I reviewed medical notes including emergency department notes from June 10 and also notes from late April when the patient was admitted.  The patient has comorbidities including deficits from previous stroke  I ordered and reviewed labs.  BMP appears grossly at baseline; WBC elevated at 16.1; UA with moderate leukocytes, rare bacteria; urine culture pending  I ordered and interpreted imaging including a CT abdomen pelvis with contrast. 1. The gallbladder is distended with cholelithiasis, however with no  other acute inflammatory changes. Correlate with exam, and consider  further evaluation with right upper quadrant ultrasound if  appropriate.  2. Right inguinal hernia containing a small loop of small bowel  without complicating feature.  3. Other chronic findings as described.  I agree with the radiologist's findings  After a new coud catheter was placed the patient had a return of urine.  He stated he felt much better and had no further complaints.  CT scan unremarkable other than cholelithiasis but patient has no complaints of right upper quadrant pain.  Patient does have a mild leukocytosis of unknown etiology but urine does not appear to be grossly infected.  Urine culture is pending.  Plan to discharge at this time with plans for follow-up with urology.  Urine culture will be followed through the hospital and if culture is positive patient will be contacted for antibiotic therapy.       Final Clinical Impression(s) / ED  Diagnoses Final diagnoses:  Problem with Foley catheter, initial encounter [facility])    Rx / DC Orders ED Discharge Orders     None         Pamala Duffel 11/19/22 1614    Alvira Monday, MD 11/20/22 2257

## 2022-11-19 NOTE — Discharge Instructions (Signed)
You were evaluated today after a foley catheter problem. Your catheter was replaced. Please follow up with your urologist as needed. If you develop any life threatening symptoms please return to the emergency department.

## 2022-11-20 ENCOUNTER — Other Ambulatory Visit: Payer: Self-pay

## 2022-11-20 ENCOUNTER — Telehealth: Payer: Self-pay

## 2022-11-20 LAB — URINE CULTURE: Culture: 80000 — AB

## 2022-11-20 NOTE — Progress Notes (Signed)
Patient attempted to be outreached by Rayla Pember, PharmD Candidate on 11/20/22 to discuss hypertension. Left voicemail for patient to return our call at their convenience at [phone].  Finnian Husted, Student-PharmD  

## 2022-11-20 NOTE — Telephone Encounter (Signed)
[name] from [facility] calling for OT verbal orders as follows:  1 time(s) weekly for 1 week(s), then 1 visit in three weeks for follow up and discharge.   Verbal orders given per [facility] protocol  Veronda Prude, RN

## 2022-11-21 ENCOUNTER — Inpatient Hospital Stay: Payer: Medicare HMO | Admitting: Family Medicine

## 2022-11-21 LAB — URINE CULTURE

## 2022-11-22 ENCOUNTER — Telehealth (HOSPITAL_BASED_OUTPATIENT_CLINIC_OR_DEPARTMENT_OTHER): Payer: Self-pay | Admitting: *Deleted

## 2022-11-22 NOTE — Telephone Encounter (Signed)
Post ED Visit - Positive Culture Follow-up: Unsuccessful Patient Follow-up  Culture assessed and recommendations reviewed by:  [x]  Delmar Landau Pharm.D. []  Celedonio Miyamoto, Pharm.D., BCPS AQ-ID []  Garvin Fila, Pharm.D., BCPS []  Georgina Pillion, Pharm.D., BCPS []  [place], [address].D., BCPS, AAHIVP []  Estella Husk, Pharm.D., BCPS, AAHIVP []  Sherlynn Carbon, PharmD []  Pollyann Samples, PharmD, BCPS  Positive urine culture  [x]  Patient discharged without antimicrobial prescription and treatment is now indicated []  Organism is resistant to prescribed ED discharge antimicrobial []  Patient with positive blood cultures  Start Keflex 500mg  BID x 7 days per Achille Rich, PA  Unable to contact patient , letter will be sent to address on file  Patsey Berthold 11/22/2022, 2:13 PM

## 2022-11-22 NOTE — Progress Notes (Signed)
ED Antimicrobial Stewardship Positive Culture Follow Up   [name] is an 72 y.o. male who presented to [facility] on 11/19/2022 with a chief complaint of  Chief Complaint  Patient presents with   Foley Catheter Problem    Recent Results (from the past 720 hour(s))  Urine Culture     Status: Abnormal   Collection Time: 11/10/22  7:35 PM   Specimen: Urine, Catheterized  Result Value Ref Range Status   Specimen Description URINE, CATHETERIZED  Final   Special Requests   Final    NONE Reflexed from [id number] Performed at [facility], [address] [place], [place] [address]    Culture >=100,000 COLONIES/mL PROTEUS MIRABILIS (A)  Final   Report Status 11/12/2022 FINAL  Final   Organism ID, Bacteria PROTEUS MIRABILIS (A)  Final      Susceptibility   Proteus mirabilis - MIC*    AMPICILLIN >=32 RESISTANT Resistant     CEFAZOLIN 8 SENSITIVE Sensitive     CEFEPIME <=0.12 SENSITIVE Sensitive     CEFTRIAXONE <=0.25 SENSITIVE Sensitive     CIPROFLOXACIN >=4 RESISTANT Resistant     GENTAMICIN <=1 SENSITIVE Sensitive     IMIPENEM <=0.25 SENSITIVE Sensitive     NITROFURANTOIN 256 RESISTANT Resistant     TRIMETH/SULFA <=20 SENSITIVE Sensitive     AMPICILLIN/SULBACTAM 8 SENSITIVE Sensitive     PIP/TAZO <=4 SENSITIVE Sensitive     * >=100,000 COLONIES/mL PROTEUS MIRABILIS  Urine Culture     Status: Abnormal   Collection Time: 11/19/22 12:28 PM   Specimen: Urine, Random  Result Value Ref Range Status   Specimen Description URINE, RANDOM  Final   Special Requests   Final    NONE Reflexed from [id number] Performed at [facility], [address] [place], [place] [address]    Culture 80,000 COLONIES/mL PROTEUS MIRABILIS (A)  Final   Report Status 11/21/2022 FINAL  Final   Organism ID, Bacteria PROTEUS MIRABILIS (A)  Final      Susceptibility   Proteus mirabilis - MIC*    AMPICILLIN >=32 RESISTANT Resistant     CEFAZOLIN <=4 SENSITIVE Sensitive     CEFTRIAXONE <=0.25  SENSITIVE Sensitive     CIPROFLOXACIN >=4 RESISTANT Resistant     GENTAMICIN <=1 SENSITIVE Sensitive     IMIPENEM <=0.25 SENSITIVE Sensitive     NITROFURANTOIN 128 RESISTANT Resistant     TRIMETH/SULFA <=20 SENSITIVE Sensitive     AMPICILLIN/SULBACTAM 8 SENSITIVE Sensitive     * 80,000 COLONIES/mL PROTEUS MIRABILIS    [x]  Patient discharged originally without antimicrobial agent and treatment is now indicated  New antibiotic prescription: Keflex 500 mg BID x 7 days (Qty 14; Refills 0)  ED Provider: Achille Rich PA   Delmar Landau, PharmD, BCPS 11/22/2022 9:50 AM ED Clinical Pharmacist -  [phone]

## 2022-11-26 ENCOUNTER — Telehealth: Payer: Self-pay

## 2022-11-26 NOTE — Telephone Encounter (Signed)
Transition Care Management Unsuccessful Follow-up Telephone Call  Date of discharge and from where:  [place] 6/19  Attempts:  1st Attempt  Reason for unsuccessful TCM follow-up call:  Unable to leave message   Jezreel Justiniano [facility] Guide, [place] [phone] [address], [place], [place] [address] Phone: [phone] Email: [email or web address]       

## 2022-11-27 ENCOUNTER — Telehealth: Payer: Self-pay

## 2022-11-27 NOTE — Telephone Encounter (Signed)
Transition Care Management Unsuccessful Follow-up Telephone Call  Date of discharge and from where:  [place] 6/19  Attempts:  2nd Attempt  Reason for unsuccessful TCM follow-up call:  Left voice message   [name] Pop [facility] Guide, [place] [phone] [address], , [place] [address] Phone: [phone] Email: [email or web address]       

## 2022-11-28 DIAGNOSIS — Z7984 Long term (current) use of oral hypoglycemic drugs: Secondary | ICD-10-CM | POA: Diagnosis not present

## 2022-11-28 DIAGNOSIS — Z794 Long term (current) use of insulin: Secondary | ICD-10-CM | POA: Diagnosis not present

## 2022-11-28 DIAGNOSIS — E1122 Type 2 diabetes mellitus with diabetic chronic kidney disease: Secondary | ICD-10-CM | POA: Diagnosis not present

## 2022-11-28 DIAGNOSIS — N182 Chronic kidney disease, stage 2 (mild): Secondary | ICD-10-CM | POA: Diagnosis not present

## 2022-11-28 DIAGNOSIS — F32A Depression, unspecified: Secondary | ICD-10-CM | POA: Diagnosis not present

## 2022-11-28 DIAGNOSIS — N390 Urinary tract infection, site not specified: Secondary | ICD-10-CM | POA: Diagnosis not present

## 2022-11-28 DIAGNOSIS — I129 Hypertensive chronic kidney disease with stage 1 through stage 4 chronic kidney disease, or unspecified chronic kidney disease: Secondary | ICD-10-CM | POA: Diagnosis not present

## 2022-11-28 DIAGNOSIS — Z9181 History of falling: Secondary | ICD-10-CM | POA: Diagnosis not present

## 2022-11-28 DIAGNOSIS — E785 Hyperlipidemia, unspecified: Secondary | ICD-10-CM | POA: Diagnosis not present

## 2022-11-28 DIAGNOSIS — Z466 Encounter for fitting and adjustment of urinary device: Secondary | ICD-10-CM | POA: Diagnosis not present

## 2022-11-28 DIAGNOSIS — T83511D Infection and inflammatory reaction due to indwelling urethral catheter, subsequent encounter: Secondary | ICD-10-CM | POA: Diagnosis not present

## 2022-11-28 DIAGNOSIS — I69351 Hemiplegia and hemiparesis following cerebral infarction affecting right dominant side: Secondary | ICD-10-CM | POA: Diagnosis not present

## 2022-11-28 DIAGNOSIS — E1165 Type 2 diabetes mellitus with hyperglycemia: Secondary | ICD-10-CM | POA: Diagnosis not present

## 2022-11-28 DIAGNOSIS — E1151 Type 2 diabetes mellitus with diabetic peripheral angiopathy without gangrene: Secondary | ICD-10-CM | POA: Diagnosis not present

## 2022-11-28 DIAGNOSIS — B9689 Other specified bacterial agents as the cause of diseases classified elsewhere: Secondary | ICD-10-CM | POA: Diagnosis not present

## 2022-11-28 DIAGNOSIS — C61 Malignant neoplasm of prostate: Secondary | ICD-10-CM | POA: Diagnosis not present

## 2022-11-28 DIAGNOSIS — Z7902 Long term (current) use of antithrombotics/antiplatelets: Secondary | ICD-10-CM | POA: Diagnosis not present

## 2022-12-01 DIAGNOSIS — N390 Urinary tract infection, site not specified: Secondary | ICD-10-CM | POA: Diagnosis not present

## 2022-12-01 DIAGNOSIS — Z466 Encounter for fitting and adjustment of urinary device: Secondary | ICD-10-CM | POA: Diagnosis not present

## 2022-12-01 DIAGNOSIS — E1151 Type 2 diabetes mellitus with diabetic peripheral angiopathy without gangrene: Secondary | ICD-10-CM | POA: Diagnosis not present

## 2022-12-01 DIAGNOSIS — I69351 Hemiplegia and hemiparesis following cerebral infarction affecting right dominant side: Secondary | ICD-10-CM | POA: Diagnosis not present

## 2022-12-01 DIAGNOSIS — B9689 Other specified bacterial agents as the cause of diseases classified elsewhere: Secondary | ICD-10-CM | POA: Diagnosis not present

## 2022-12-01 DIAGNOSIS — E1122 Type 2 diabetes mellitus with diabetic chronic kidney disease: Secondary | ICD-10-CM | POA: Diagnosis not present

## 2022-12-01 DIAGNOSIS — T83511D Infection and inflammatory reaction due to indwelling urethral catheter, subsequent encounter: Secondary | ICD-10-CM | POA: Diagnosis not present

## 2022-12-01 DIAGNOSIS — F32A Depression, unspecified: Secondary | ICD-10-CM | POA: Diagnosis not present

## 2022-12-01 DIAGNOSIS — E1165 Type 2 diabetes mellitus with hyperglycemia: Secondary | ICD-10-CM | POA: Diagnosis not present

## 2022-12-01 DIAGNOSIS — Z9181 History of falling: Secondary | ICD-10-CM | POA: Diagnosis not present

## 2022-12-01 DIAGNOSIS — N182 Chronic kidney disease, stage 2 (mild): Secondary | ICD-10-CM | POA: Diagnosis not present

## 2022-12-01 DIAGNOSIS — Z794 Long term (current) use of insulin: Secondary | ICD-10-CM | POA: Diagnosis not present

## 2022-12-01 DIAGNOSIS — C61 Malignant neoplasm of prostate: Secondary | ICD-10-CM | POA: Diagnosis not present

## 2022-12-01 DIAGNOSIS — I129 Hypertensive chronic kidney disease with stage 1 through stage 4 chronic kidney disease, or unspecified chronic kidney disease: Secondary | ICD-10-CM | POA: Diagnosis not present

## 2022-12-01 DIAGNOSIS — Z7984 Long term (current) use of oral hypoglycemic drugs: Secondary | ICD-10-CM | POA: Diagnosis not present

## 2022-12-01 DIAGNOSIS — Z7902 Long term (current) use of antithrombotics/antiplatelets: Secondary | ICD-10-CM | POA: Diagnosis not present

## 2022-12-01 DIAGNOSIS — E785 Hyperlipidemia, unspecified: Secondary | ICD-10-CM | POA: Diagnosis not present

## 2022-12-03 ENCOUNTER — Telehealth (HOSPITAL_BASED_OUTPATIENT_CLINIC_OR_DEPARTMENT_OTHER): Payer: Self-pay

## 2022-12-03 DIAGNOSIS — R338 Other retention of urine: Secondary | ICD-10-CM | POA: Diagnosis not present

## 2022-12-03 DIAGNOSIS — R3914 Feeling of incomplete bladder emptying: Secondary | ICD-10-CM | POA: Diagnosis not present

## 2022-12-03 NOTE — Telephone Encounter (Signed)
12/03/22@ 9:25 am.  Call rcvd from Pt's wife [name] regarding letter rcvd from [facility] dept. ID verified with pt and permission given to speak with wife.  Informed of need for Rx for Keflex 500 mg BID x 7days written by Achille Rich PA  for UTI from 6/19 visit. Rx called and given to Rph @ [facility] [address] [facility] [phone]

## 2022-12-10 DIAGNOSIS — N182 Chronic kidney disease, stage 2 (mild): Secondary | ICD-10-CM | POA: Diagnosis not present

## 2022-12-10 DIAGNOSIS — Z794 Long term (current) use of insulin: Secondary | ICD-10-CM | POA: Diagnosis not present

## 2022-12-10 DIAGNOSIS — Z9181 History of falling: Secondary | ICD-10-CM | POA: Diagnosis not present

## 2022-12-10 DIAGNOSIS — E1122 Type 2 diabetes mellitus with diabetic chronic kidney disease: Secondary | ICD-10-CM | POA: Diagnosis not present

## 2022-12-10 DIAGNOSIS — Z7984 Long term (current) use of oral hypoglycemic drugs: Secondary | ICD-10-CM | POA: Diagnosis not present

## 2022-12-10 DIAGNOSIS — E1151 Type 2 diabetes mellitus with diabetic peripheral angiopathy without gangrene: Secondary | ICD-10-CM | POA: Diagnosis not present

## 2022-12-10 DIAGNOSIS — N390 Urinary tract infection, site not specified: Secondary | ICD-10-CM | POA: Diagnosis not present

## 2022-12-10 DIAGNOSIS — Z466 Encounter for fitting and adjustment of urinary device: Secondary | ICD-10-CM | POA: Diagnosis not present

## 2022-12-10 DIAGNOSIS — B9689 Other specified bacterial agents as the cause of diseases classified elsewhere: Secondary | ICD-10-CM | POA: Diagnosis not present

## 2022-12-10 DIAGNOSIS — E785 Hyperlipidemia, unspecified: Secondary | ICD-10-CM | POA: Diagnosis not present

## 2022-12-10 DIAGNOSIS — E1165 Type 2 diabetes mellitus with hyperglycemia: Secondary | ICD-10-CM | POA: Diagnosis not present

## 2022-12-10 DIAGNOSIS — I69351 Hemiplegia and hemiparesis following cerebral infarction affecting right dominant side: Secondary | ICD-10-CM | POA: Diagnosis not present

## 2022-12-10 DIAGNOSIS — C61 Malignant neoplasm of prostate: Secondary | ICD-10-CM | POA: Diagnosis not present

## 2022-12-10 DIAGNOSIS — Z7902 Long term (current) use of antithrombotics/antiplatelets: Secondary | ICD-10-CM | POA: Diagnosis not present

## 2022-12-10 DIAGNOSIS — I129 Hypertensive chronic kidney disease with stage 1 through stage 4 chronic kidney disease, or unspecified chronic kidney disease: Secondary | ICD-10-CM | POA: Diagnosis not present

## 2022-12-10 DIAGNOSIS — T83511D Infection and inflammatory reaction due to indwelling urethral catheter, subsequent encounter: Secondary | ICD-10-CM | POA: Diagnosis not present

## 2022-12-10 DIAGNOSIS — F32A Depression, unspecified: Secondary | ICD-10-CM | POA: Diagnosis not present

## 2022-12-12 ENCOUNTER — Ambulatory Visit (INDEPENDENT_AMBULATORY_CARE_PROVIDER_SITE_OTHER): Payer: Medicare HMO | Admitting: Family Medicine

## 2022-12-12 ENCOUNTER — Other Ambulatory Visit: Payer: Self-pay

## 2022-12-12 VITALS — BP 178/62 | HR 53

## 2022-12-12 DIAGNOSIS — Z794 Long term (current) use of insulin: Secondary | ICD-10-CM | POA: Diagnosis not present

## 2022-12-12 DIAGNOSIS — I10 Essential (primary) hypertension: Secondary | ICD-10-CM | POA: Diagnosis not present

## 2022-12-12 DIAGNOSIS — E1159 Type 2 diabetes mellitus with other circulatory complications: Secondary | ICD-10-CM

## 2022-12-12 NOTE — Progress Notes (Signed)
    SUBJECTIVE:   CHIEF COMPLAINT / HPI:   Hypertension -has taken blood pressure medications today -states that he takes them daily  Diabetes mellitus -Wants to get CGM applied -agreeable to meeting with [name] to help with CGM and medication management  Social needs -pt social worker present and inquiring about Safe transportation  -daughter has to take off work to bring him to appointments, this is a source of stress for her   PERTINENT  PMH / PSH: reviewed  OBJECTIVE:   BP (!) 178/62   Pulse (!) 53   SpO2 100%     Physical Exam Constitutional:      Appearance: Normal appearance.  Cardiovascular:     Rate and Rhythm: Regular rhythm. Bradycardia present.  Pulmonary:     Effort: Pulmonary effort is normal.     Breath sounds: Normal breath sounds.  Neurological:     Mental Status: He is alert.     ASSESSMENT/PLAN:   Hypertension -hesitant to increase carvedilol dose due to bradycardia today -continue carvedilol 12.5mg  -will revisit antihypertensive regimen at a future appointment  Diabetes mellitus -appointment with [name] August 1st at 330pm  Social needs -will look into [employer] and contact patient's social worker with information -Social worker- [phone] [name]  Patient Instructions  Thank you for coming in today!  Things we discussed today: Continue taking your carvedilol for your blood pressure. We will recheck this at the next visit and discuss any changes we may want to make 2.  I made you an appointment to meet with our pharmacist, [name], to discuss your blood sugar monitor and diabetes medications. It is scheduled for Thursday, August 1st at 3:15  Please make an appointment to see me in 2 weeks.  Have a great day!  Kyle Bender, MD [facility]

## 2022-12-12 NOTE — Patient Instructions (Signed)
Thank you for coming in today!  Things we discussed today: Continue taking your carvedilol for your blood pressure. We will recheck this at the next visit and discuss any changes we may want to make 2.  I made you an appointment to meet with our pharmacist, Dr Raymondo Band, to discuss your blood sugar monitor and diabetes medications. It is scheduled for Thursday, August 1st at 3:15  Please make an appointment to see me in 2 weeks.  Have a great day!

## 2022-12-17 DIAGNOSIS — Z9181 History of falling: Secondary | ICD-10-CM | POA: Diagnosis not present

## 2022-12-17 DIAGNOSIS — E1165 Type 2 diabetes mellitus with hyperglycemia: Secondary | ICD-10-CM | POA: Diagnosis not present

## 2022-12-17 DIAGNOSIS — I129 Hypertensive chronic kidney disease with stage 1 through stage 4 chronic kidney disease, or unspecified chronic kidney disease: Secondary | ICD-10-CM | POA: Diagnosis not present

## 2022-12-17 DIAGNOSIS — N182 Chronic kidney disease, stage 2 (mild): Secondary | ICD-10-CM | POA: Diagnosis not present

## 2022-12-17 DIAGNOSIS — B9689 Other specified bacterial agents as the cause of diseases classified elsewhere: Secondary | ICD-10-CM | POA: Diagnosis not present

## 2022-12-17 DIAGNOSIS — E1151 Type 2 diabetes mellitus with diabetic peripheral angiopathy without gangrene: Secondary | ICD-10-CM | POA: Diagnosis not present

## 2022-12-17 DIAGNOSIS — I69351 Hemiplegia and hemiparesis following cerebral infarction affecting right dominant side: Secondary | ICD-10-CM | POA: Diagnosis not present

## 2022-12-17 DIAGNOSIS — Z7902 Long term (current) use of antithrombotics/antiplatelets: Secondary | ICD-10-CM | POA: Diagnosis not present

## 2022-12-17 DIAGNOSIS — C61 Malignant neoplasm of prostate: Secondary | ICD-10-CM | POA: Diagnosis not present

## 2022-12-17 DIAGNOSIS — T83511D Infection and inflammatory reaction due to indwelling urethral catheter, subsequent encounter: Secondary | ICD-10-CM | POA: Diagnosis not present

## 2022-12-17 DIAGNOSIS — F32A Depression, unspecified: Secondary | ICD-10-CM | POA: Diagnosis not present

## 2022-12-17 DIAGNOSIS — Z794 Long term (current) use of insulin: Secondary | ICD-10-CM | POA: Diagnosis not present

## 2022-12-17 DIAGNOSIS — E785 Hyperlipidemia, unspecified: Secondary | ICD-10-CM | POA: Diagnosis not present

## 2022-12-17 DIAGNOSIS — E1122 Type 2 diabetes mellitus with diabetic chronic kidney disease: Secondary | ICD-10-CM | POA: Diagnosis not present

## 2022-12-17 DIAGNOSIS — N390 Urinary tract infection, site not specified: Secondary | ICD-10-CM | POA: Diagnosis not present

## 2022-12-17 DIAGNOSIS — Z7984 Long term (current) use of oral hypoglycemic drugs: Secondary | ICD-10-CM | POA: Diagnosis not present

## 2022-12-17 DIAGNOSIS — Z466 Encounter for fitting and adjustment of urinary device: Secondary | ICD-10-CM | POA: Diagnosis not present

## 2022-12-19 ENCOUNTER — Other Ambulatory Visit: Payer: Self-pay | Admitting: Family Medicine

## 2022-12-19 ENCOUNTER — Telehealth: Payer: Self-pay

## 2022-12-19 NOTE — Telephone Encounter (Signed)
Marcelino Duster patients home health nurse calls nurse line requesting an order for a [facility] Reader.  She reports he was discharged with [employer], however his phone is not compatible.   Please send Josephine Igo 2 Reader to patients pharmacy.   Will forward to PCP.

## 2022-12-22 NOTE — Telephone Encounter (Signed)
Attempted to contact patient for follow-up of CGM - [place] 2 discussion.   Left HIPAA compliant voice mail requesting call back to direct phone: [phone]  Plan to discuss option of changing to [place] 3 with next fill.    Total time with patient call and documentation of interaction: 7 minutes.  Additional follow-up phone when patient returns call.

## 2022-12-24 NOTE — Telephone Encounter (Signed)
Reviewed and agree with Dr Koval's plan.   

## 2022-12-25 ENCOUNTER — Ambulatory Visit: Payer: Medicare HMO | Admitting: Pharmacist

## 2022-12-25 DIAGNOSIS — Z794 Long term (current) use of insulin: Secondary | ICD-10-CM | POA: Diagnosis not present

## 2022-12-25 DIAGNOSIS — B9689 Other specified bacterial agents as the cause of diseases classified elsewhere: Secondary | ICD-10-CM | POA: Diagnosis not present

## 2022-12-25 DIAGNOSIS — Z7902 Long term (current) use of antithrombotics/antiplatelets: Secondary | ICD-10-CM | POA: Diagnosis not present

## 2022-12-25 DIAGNOSIS — I69351 Hemiplegia and hemiparesis following cerebral infarction affecting right dominant side: Secondary | ICD-10-CM | POA: Diagnosis not present

## 2022-12-25 DIAGNOSIS — Z466 Encounter for fitting and adjustment of urinary device: Secondary | ICD-10-CM | POA: Diagnosis not present

## 2022-12-25 DIAGNOSIS — E1165 Type 2 diabetes mellitus with hyperglycemia: Secondary | ICD-10-CM | POA: Diagnosis not present

## 2022-12-25 DIAGNOSIS — F32A Depression, unspecified: Secondary | ICD-10-CM | POA: Diagnosis not present

## 2022-12-25 DIAGNOSIS — Z7984 Long term (current) use of oral hypoglycemic drugs: Secondary | ICD-10-CM | POA: Diagnosis not present

## 2022-12-25 DIAGNOSIS — N182 Chronic kidney disease, stage 2 (mild): Secondary | ICD-10-CM | POA: Diagnosis not present

## 2022-12-25 DIAGNOSIS — N390 Urinary tract infection, site not specified: Secondary | ICD-10-CM | POA: Diagnosis not present

## 2022-12-25 DIAGNOSIS — E1122 Type 2 diabetes mellitus with diabetic chronic kidney disease: Secondary | ICD-10-CM | POA: Diagnosis not present

## 2022-12-25 DIAGNOSIS — E1151 Type 2 diabetes mellitus with diabetic peripheral angiopathy without gangrene: Secondary | ICD-10-CM | POA: Diagnosis not present

## 2022-12-25 DIAGNOSIS — I129 Hypertensive chronic kidney disease with stage 1 through stage 4 chronic kidney disease, or unspecified chronic kidney disease: Secondary | ICD-10-CM | POA: Diagnosis not present

## 2022-12-25 DIAGNOSIS — T83511D Infection and inflammatory reaction due to indwelling urethral catheter, subsequent encounter: Secondary | ICD-10-CM | POA: Diagnosis not present

## 2022-12-25 DIAGNOSIS — E785 Hyperlipidemia, unspecified: Secondary | ICD-10-CM | POA: Diagnosis not present

## 2022-12-25 DIAGNOSIS — Z9181 History of falling: Secondary | ICD-10-CM | POA: Diagnosis not present

## 2022-12-25 DIAGNOSIS — C61 Malignant neoplasm of prostate: Secondary | ICD-10-CM | POA: Diagnosis not present

## 2022-12-31 DIAGNOSIS — N390 Urinary tract infection, site not specified: Secondary | ICD-10-CM | POA: Diagnosis not present

## 2022-12-31 DIAGNOSIS — N182 Chronic kidney disease, stage 2 (mild): Secondary | ICD-10-CM | POA: Diagnosis not present

## 2022-12-31 DIAGNOSIS — I129 Hypertensive chronic kidney disease with stage 1 through stage 4 chronic kidney disease, or unspecified chronic kidney disease: Secondary | ICD-10-CM | POA: Diagnosis not present

## 2022-12-31 DIAGNOSIS — B9689 Other specified bacterial agents as the cause of diseases classified elsewhere: Secondary | ICD-10-CM | POA: Diagnosis not present

## 2022-12-31 DIAGNOSIS — C61 Malignant neoplasm of prostate: Secondary | ICD-10-CM | POA: Diagnosis not present

## 2022-12-31 DIAGNOSIS — I69351 Hemiplegia and hemiparesis following cerebral infarction affecting right dominant side: Secondary | ICD-10-CM | POA: Diagnosis not present

## 2022-12-31 DIAGNOSIS — Z466 Encounter for fitting and adjustment of urinary device: Secondary | ICD-10-CM | POA: Diagnosis not present

## 2022-12-31 DIAGNOSIS — E1165 Type 2 diabetes mellitus with hyperglycemia: Secondary | ICD-10-CM | POA: Diagnosis not present

## 2022-12-31 DIAGNOSIS — E785 Hyperlipidemia, unspecified: Secondary | ICD-10-CM | POA: Diagnosis not present

## 2022-12-31 DIAGNOSIS — T83511D Infection and inflammatory reaction due to indwelling urethral catheter, subsequent encounter: Secondary | ICD-10-CM | POA: Diagnosis not present

## 2022-12-31 DIAGNOSIS — Z9181 History of falling: Secondary | ICD-10-CM | POA: Diagnosis not present

## 2022-12-31 DIAGNOSIS — Z7984 Long term (current) use of oral hypoglycemic drugs: Secondary | ICD-10-CM | POA: Diagnosis not present

## 2022-12-31 DIAGNOSIS — Z7902 Long term (current) use of antithrombotics/antiplatelets: Secondary | ICD-10-CM | POA: Diagnosis not present

## 2022-12-31 DIAGNOSIS — E1122 Type 2 diabetes mellitus with diabetic chronic kidney disease: Secondary | ICD-10-CM | POA: Diagnosis not present

## 2022-12-31 DIAGNOSIS — Z794 Long term (current) use of insulin: Secondary | ICD-10-CM | POA: Diagnosis not present

## 2022-12-31 DIAGNOSIS — E1151 Type 2 diabetes mellitus with diabetic peripheral angiopathy without gangrene: Secondary | ICD-10-CM | POA: Diagnosis not present

## 2022-12-31 DIAGNOSIS — F32A Depression, unspecified: Secondary | ICD-10-CM | POA: Diagnosis not present

## 2023-01-01 ENCOUNTER — Other Ambulatory Visit: Payer: Self-pay

## 2023-01-01 ENCOUNTER — Ambulatory Visit (INDEPENDENT_AMBULATORY_CARE_PROVIDER_SITE_OTHER): Payer: Medicare HMO | Admitting: Pharmacist

## 2023-01-01 ENCOUNTER — Encounter: Payer: Self-pay | Admitting: Pharmacist

## 2023-01-01 VITALS — BP 162/68 | HR 57 | Ht 64.0 in | Wt 208.4 lb

## 2023-01-01 DIAGNOSIS — Z794 Long term (current) use of insulin: Secondary | ICD-10-CM | POA: Diagnosis not present

## 2023-01-01 DIAGNOSIS — F32A Depression, unspecified: Secondary | ICD-10-CM

## 2023-01-01 DIAGNOSIS — E1159 Type 2 diabetes mellitus with other circulatory complications: Secondary | ICD-10-CM | POA: Diagnosis not present

## 2023-01-01 DIAGNOSIS — I10 Essential (primary) hypertension: Secondary | ICD-10-CM

## 2023-01-01 NOTE — Progress Notes (Signed)
S:     Chief Complaint  Patient presents with   Medication Management    CGM placement    72 y.o. male who presents for diabetes evaluation, education, and management. Patient arrives in good spirits and presents with assistance of a walking cane. Patient is accompanied by his daughter, Nickola Major.   Patient was referred and last seen by Primary Care Provider, Dr. Dolan Amen, on 12/12/2022.   PMH is significant for Prostate Cancer, Stroke with Right-sided hemiparesis, Chronic indwelling urinary catheter, PAD and Hypertension.   At last visit, wide pulse pressure with elevated systolic pressure was noted. CGM initiation was also discussed.   Patient has had an insurance change to [employer] [facility] non-formulary and patient will need Dexcom CGM.)   Patient reports Diabetes was diagnosed in 2008.   Family/Social History: Children are supportive and check on him throughout the week.  Current diabetes medications include: Metformin 1000mg  BID, Lantus 12 units daily, and prescribed Jardiance (empagliflozin) 10mg  but has not recently been taking.  Anticipated restarting this next week.  Current hypertension medications include: Carvedilol 12.5mg  BID, amlodipine prescribed but patient has not taken recently. - plans to restart next week.  Current hyperlipidemia medications include: Atorvastatin 40mg  daily   Patient denies adherence with medications, reports missing Jardiance and amlodipine due to lack of supply of these medications but anticipates having them again soon and plans to restart.    Insurance coverage: [employer]  Patient denies hypoglycemic events.  Patient-reported exercise habits: Limited due to stroke with Right-side hemiparesis   Majority of visit > 40 minutes was used to initiate appropriate CGM for his insurance on his phone, educate on CGM use and place sensor including connectivity with phone.    O:   Review of Systems  Neurological:  Positive for focal weakness (right side).     Physical Exam Pulmonary:     Effort: Pulmonary effort is normal.  Neurological:     Mental Status: He is alert. Mental status is at baseline.  Psychiatric:        Thought Content: Thought content normal.     Lab Results  Component Value Date   HGBA1C 9.7 (H) 09/29/2022   Vitals:   01/01/23 1624 01/01/23 1628  BP: (!) 165/65 (!) 162/68  Pulse: (!) 57   SpO2: 100%     Lipid Panel     Component Value Date/Time   CHOL 108 11/16/2021 0050   CHOL 111 11/27/2020 1420   TRIG 62 11/16/2021 0050   HDL 37 (L) 11/16/2021 0050   HDL 47 11/27/2020 1420   CHOLHDL 2.9 11/16/2021 0050   VLDL 12 11/16/2021 0050   LDLCALC 59 11/16/2021 0050   LDLCALC 52 11/27/2020 1420   LDLDIRECT 78 09/10/2011 1705    Clinical Atherosclerotic Cardiovascular Disease (ASCVD): Yes   PAD and Stroke The ASCVD Risk score (Arnett DK, et al., 2019) failed to calculate for the following reasons:   The patient has a prior MI or stroke diagnosis   A/P: Diabetes longstanding since 2008 with poor control based on recent A1c.  Currently taking Insulin and metformin but denies recent use of Jardiace (empagliflozin). Patient is able to verbalize appropriate hypoglycemia management plan. Medication adherence appears mixed without supply of two medications but reported adherence to other medications.  CGM placement appropriate as this may assist with overall understanding of control.  -Continued basal insulin Lantus (insulin glargine) at 12 units once daily in the later portion of the day.    -Restarted SGLT2-I Jardiance (  empagliflozin) 10 mg with next pharmacy fill. Counseled on sick day rules. -Continued metformin 1000mg  BID.  -Patient educated on purpose, and proper use of his Dexcom G7 CGM.   -Patient following up 8/2 with PCP, Dr. Dolan Amen.  Assess use of CGM at that time.  Patient was switched to [facility] G7 as this is the required CGM for his insurance - Community education officer. THREE sample Dexcom G7 sensors provided to patient.    Hypertension longstanding with wide pulse pressure.  Currently with systolic readings of 160-170. Systolic blood pressure goal of <140 mmHg may be difficult without increasing risk of dizziness and falls.  Short-term goal for systolic may be a reading of < Hg.  Medication adherence mixed, currently without amlodipine.  -Continued carvedilol 12.5mg  BID -Restart amlodipine when available.  Consider next prescription of 10mg  tablets (in place of 2X5mg ) for patient convenience.   Written patient instructions provided. Patient verbalized understanding of treatment plan.  Total time in face to face counseling 67 minutes.    Follow-up:  Pharmacist PRN PCP clinic visit in 01/02/2023 - 4:00 PM - I will try to make myself available to assist with Dexcom data sharing app and practice connection at that appointment.  Patient seen with Rickey Primus, PharmD Candidate.

## 2023-01-01 NOTE — Patient Instructions (Signed)
It was a pleasure seeing you today!  Prior to your next Appointment - Please download the Dexcom Clarity APP    Please remember the following about your [facility] G7 Sensor 1. Sensor will last 10 days 2. Transmitter must be within 20 feet of receiver/cell phone. 3. If using Dexcom G7 app on cell phone, please remember to keep app open (do not close out of app). 4. Do a fingerstick blood glucose test if the sensor readings do not match how you feel 5. Remove sensor prior to magnetic resonance imaging (MRI), computed tomography (CT) scan, or high-frequency electrical heat  diathermy) treatment. 6. Do not allow sun screen or insect repellant to come into contact with Dexcom G7. These skin care products may lead for the plastic used in the Dexcom G7 to crack. 7. Dexcom G7 may be worn through a Industrial/product designer. It may not be exposed to an advanced Imaging Technology (AIT) body scanner (also called a millimeter wave scanner) or the baggage x-ray machine. Instead, ask for hand-wanding or full-body pat-down and visual inspection.  8. Doses of acetaminophen (Tylenol) >1 gram every 6 hours may cause false high readings. 9. Store sensor kit between 36 and 86 degrees Farenheit. Can be refrigerated within this temperature range.   Hypoglycemia or low blood sugar:   Low blood sugar can happen quickly and may become an emergency if not treated right away.   While this shouldn't happen often, it can be brought upon if you skip a meal or do not eat enough. Also, if your insulin or other diabetes medications are dosed too high, this can cause your blood sugar to go to low.   Warning signs of low blood sugar include: Feeling shaky or dizzy Feeling weak or tired  Excessive hunger Feeling anxious or upset  Sweating even when you aren't exercising  Problems with Dexcom sticking? 1. Order Skin Tac from [facility]. Alcohol swab area you plan to administer Dexcom then let dry. Once dry, apply Skin Tac in  a circular motion (with a spot in the middle for sensor without skin tac) and let dry. Once dry you can apply Dexcom!  Dexcom Customer Service Information Customer Sales Support (dexcom orders and general customer questions) Phone number: [phone] Monday - Friday  6 AM - 5 PM PST Saturday 8 AM - 4 PM PST  *Contact if you do not receive overlay patches  2. Global Technical Support (product troubleshooting or replacement inquiries) Phone number: [phone] Available 24 hours a day; 7 days a week  *Contact if you have a "bad" sensor. Remember to tell them you are wearing Dexcom on your stomach!  3. Dexcom Care (provides dexcom CGM training, software downloads, and tutorials) Phone number: [phone] Monday - Friday 6 AM - 5 PM PST Saturday 7 AM - 1:30 PM PST (All hours subject to change)  4. Website: [email or web address]

## 2023-01-02 ENCOUNTER — Other Ambulatory Visit (HOSPITAL_COMMUNITY): Payer: Self-pay

## 2023-01-02 ENCOUNTER — Ambulatory Visit: Payer: Medicare HMO | Admitting: Family Medicine

## 2023-01-02 ENCOUNTER — Encounter: Payer: Self-pay | Admitting: Family Medicine

## 2023-01-02 ENCOUNTER — Other Ambulatory Visit: Payer: Self-pay

## 2023-01-02 VITALS — BP 168/69 | HR 62

## 2023-01-02 DIAGNOSIS — E1159 Type 2 diabetes mellitus with other circulatory complications: Secondary | ICD-10-CM

## 2023-01-02 DIAGNOSIS — Z794 Long term (current) use of insulin: Secondary | ICD-10-CM | POA: Diagnosis not present

## 2023-01-02 DIAGNOSIS — I10 Essential (primary) hypertension: Secondary | ICD-10-CM

## 2023-01-02 DIAGNOSIS — Z5982 Transportation insecurity: Secondary | ICD-10-CM | POA: Diagnosis not present

## 2023-01-02 LAB — POCT GLYCOSYLATED HEMOGLOBIN (HGB A1C): HbA1c, POC (controlled diabetic range): 9.1 % — AB (ref 0.0–7.0)

## 2023-01-02 MED ORDER — DEXCOM G7 SENSOR MISC: 11 refills | Status: DC

## 2023-01-02 MED ORDER — EMPAGLIFLOZIN 10 MG PO TABS
10.0000 mg | ORAL_TABLET | Freq: Every day | ORAL | Status: DC
Start: 2023-01-02 — End: 2023-01-23

## 2023-01-02 MED ORDER — AMLODIPINE BESYLATE 10 MG PO TABS
10.0000 mg | ORAL_TABLET | Freq: Every day | ORAL | 3 refills | Status: DC
Start: 2023-01-02 — End: 2023-01-23

## 2023-01-02 MED ORDER — SERTRALINE HCL 100 MG PO TABS: 100.0000 mg | ORAL_TABLET | Freq: Every day | ORAL | 2 refills | Status: DC

## 2023-01-02 MED ORDER — CARVEDILOL 12.5 MG PO TABS: 12.5000 mg | ORAL_TABLET | Freq: Two times a day (BID) | ORAL | 3 refills | Status: DC

## 2023-01-02 NOTE — Assessment & Plan Note (Addendum)
Using dexcom CGM since 8/1 appointment with Dr Raymondo Band A1c today is 9.1, down from 9.7 in April 2024 Continue Metformin 1000mg  BID and Lantus 12U daily Will restart Jardiance 10mg   Foot exam completed today- referral to podiatry placed  Pt to schedule ophtho appt for diabetic eye exam  Next appointment scheduled for August 16th- will review CGM data and reevaluate medications

## 2023-01-02 NOTE — Patient Instructions (Addendum)
Thank you for coming in today!  Things we discussed today: For your diabetes, please continue taking Metformin 1000mg  twice daily. Please take 12units of Lantus (insulin) daily. Please begin taking Jardiance 10mg  daily. Use the Dexcom CGM app to track your blood sugars daily. 2. For your blood pressure, continue to take Carvedilol 12.5 twice daily. Please begin taking amlodipine 10mg  daily - remember, this is just 1 tablet per day. 3. I sent a referral to care coordination to assist with transportation. Next time, we can talk more about motorized wheelchair. 4. I sent a referral to podiatry. They should be contacting you to set up an appointment. 5. Also please schedule and appointment with the ophthalmologist for your diabetic eye check.  You have an appointment to see me in 2 weeks.  Have a great day!

## 2023-01-02 NOTE — Assessment & Plan Note (Signed)
Placed referral to care coordination for transportation Will refer to neuro rehab for motorized wheelchair at upcoming appointment

## 2023-01-02 NOTE — Progress Notes (Signed)
Reviewed and agree with Dr Koval's plan.   

## 2023-01-02 NOTE — Assessment & Plan Note (Addendum)
Continue Carvedilol 12.5 BID Restart Amlodipine 10mg  daily -discussed with patient and daughter that he will only need to take 1 pill at a time instead of two 5mg  tablets

## 2023-01-02 NOTE — Assessment & Plan Note (Signed)
Diabetes longstanding since 2008 with poor control based on recent A1c.  Currently taking Insulin and metformin but denies recent use of Jardiace (empagliflozin). Patient is able to verbalize appropriate hypoglycemia management plan. Medication adherence appears mixed without supply of two medications but reported adherence to other medications.  CGM placement appropriate as this may assist with overall understanding of control.  -Continued basal insulin Lantus (insulin glargine) at 12 units once daily in the later portion of the day.    -Restarted SGLT2-I Jardiance (empagliflozin) 10 mg with next pharmacy fill. Counseled on sick day rules. -Continued metformin 1000mg  BID.  -Patient educated on purpose, and proper use of his Dexcom G7 CGM.   -Patient following up 8/2 with PCP, Dr. Dolan Amen.  Assess use of CGM at that time.

## 2023-01-02 NOTE — Assessment & Plan Note (Signed)
Hypertension longstanding with wide pulse pressure.  Currently with systolic readings of 160-170. Systolic blood pressure goal of <140 mmHg may be difficult without increasing risk of dizziness and falls.  Short-term goal for systolic may be a reading of < Hg.  Medication adherence mixed, currently without amlodipine.  -Continued carvedilol 12.5mg  BID -Restart amlodipine when available.  Consider next prescription of 10mg  tablets (in place of 2X5mg ) for patient convenience.

## 2023-01-02 NOTE — Progress Notes (Addendum)
SUBJECTIVE:   CHIEF COMPLAINT / HPI:   Type 2 diabetes mellitus -met with Dr Raymondo Band about CGM yesterday (8/1), daughter has not downloaded app yet -blood glucose readings in the 150s since yesterday -metformin 1000mg  BID, lantus 12U daily, Jardiance 10mg  (need to reorder) -last A1c in April 2024: 9.7 -due for eye exam  Hypertension -taking carvedilol 12.5mg  BID -ran out of amlodipine 10mg  daily recently  [facility] -pt relies on daughter for transportation, this is a source of stress for them -would also like to get a motorized wheelchair  PERTINENT  PMH / PSH: prostate cancer, stroke with R-sided hemiparesis, chronic indwelling catheter, PAD, HTN  OBJECTIVE:   BP (!) 168/69   Pulse 62   SpO2 100%     Physical Exam Constitutional:      Comments: Chronically ill appearing  Cardiovascular:     Rate and Rhythm: Normal rate and regular rhythm.     Pulses:          Dorsalis pedis pulses are 1+ on the right side and 1+ on the left side.       Posterior tibial pulses are 1+ on the right side and 1+ on the left side.     Heart sounds: Normal heart sounds.  Pulmonary:     Effort: Pulmonary effort is normal.     Breath sounds: Normal breath sounds.  Abdominal:     Palpations: Abdomen is soft.  Feet:     Right foot:     Protective Sensation: 6 sites tested.  6 sites sensed.     Skin integrity: Erythema and dry skin present.     Toenail Condition: Right toenails are long. Fungal disease present.    Left foot:     Protective Sensation: 6 sites tested.  6 sites sensed.     Skin integrity: Erythema and dry skin present.     Toenail Condition: Left toenails are long. Fungal disease present. Neurological:     Mental Status: He is alert.     Motor: Weakness present.     Comments: R hemiplegia     ASSESSMENT/PLAN:   Essential hypertension, benign Continue Carvedilol 12.5 BID Restart Amlodipine 10mg  daily -discussed with patient and daughter that he will only need to take 1  pill at a time instead of two 5mg  tablets  T2DM (type 2 diabetes mellitus) (HCC) Using dexcom CGM since 8/1 appointment with Dr Raymondo Band A1c today is 9.1, down from 9.7 in April 2024 Continue Metformin 1000mg  BID and Lantus 12U daily Will restart Jardiance 10mg   Foot exam completed today- referral to podiatry placed  Pt to schedule ophtho appt for diabetic eye exam  Next appointment scheduled for August 16th- will review CGM data and reevaluate medications   Nervous and Auditory Placed referral to care coordination for transportation Will refer to neuro rehab for motorized wheelchair at upcoming appointment    Patient Instructions  Thank you for coming in today!  Things we discussed today: For your diabetes, please continue taking Metformin 1000mg  twice daily. Please take 12units of Lantus (insulin) daily. Please begin taking Jardiance 10mg  daily. Use the Dexcom CGM app to track your blood sugars daily. 2. For your blood pressure, continue to take Carvedilol 12.5 twice daily. Please begin taking amlodipine 10mg  daily - remember, this is just 1 tablet per day. 3. I sent a referral to care coordination to assist with transportation. Next time, we can talk more about motorized wheelchair. 4. I sent a referral to podiatry. They should  be contacting you to set up an appointment. 5. Also please schedule and appointment with the ophthalmologist for your diabetic eye check.  You have an appointment to see me in 2 weeks.  Have a great day!  [name], MD [facility]

## 2023-01-05 ENCOUNTER — Telehealth: Payer: Self-pay | Admitting: *Deleted

## 2023-01-05 NOTE — Progress Notes (Unsigned)
  Care Coordination  Outreach Note  01/05/2023 Name: [name] MRN: [id number] DOB: 08-30-1950   Care Coordination Outreach Attempts: An unsuccessful telephone outreach was attempted today to offer the patient information about available care coordination services.  Follow Up Plan:  Additional outreach attempts will be made to offer the patient care coordination information and services.   Encounter Outcome:  No Answer  Gwenevere Ghazi  Care Coordination Care Guide  Direct Dial: [phone]

## 2023-01-06 NOTE — Progress Notes (Unsigned)
  Care Coordination  Outreach Note  01/06/2023 Name: [name] MRN: [id number] DOB: 1950-11-17   Care Coordination Outreach Attempts: A second unsuccessful outreach was attempted today to offer the patient with information about available care coordination services.  Follow Up Plan:  Additional outreach attempts will be made to offer the patient care coordination information and services.   Encounter Outcome:  No Answer  Gwenevere Ghazi  Care Coordination Care Guide  Direct Dial: [phone]

## 2023-01-07 DIAGNOSIS — C61 Malignant neoplasm of prostate: Secondary | ICD-10-CM | POA: Diagnosis not present

## 2023-01-07 DIAGNOSIS — S80821D Blister (nonthermal), right lower leg, subsequent encounter: Secondary | ICD-10-CM | POA: Diagnosis not present

## 2023-01-07 DIAGNOSIS — Z87440 Personal history of urinary (tract) infections: Secondary | ICD-10-CM | POA: Diagnosis not present

## 2023-01-07 DIAGNOSIS — E1165 Type 2 diabetes mellitus with hyperglycemia: Secondary | ICD-10-CM | POA: Diagnosis not present

## 2023-01-07 DIAGNOSIS — I129 Hypertensive chronic kidney disease with stage 1 through stage 4 chronic kidney disease, or unspecified chronic kidney disease: Secondary | ICD-10-CM | POA: Diagnosis not present

## 2023-01-07 DIAGNOSIS — N182 Chronic kidney disease, stage 2 (mild): Secondary | ICD-10-CM | POA: Diagnosis not present

## 2023-01-07 DIAGNOSIS — F32A Depression, unspecified: Secondary | ICD-10-CM | POA: Diagnosis not present

## 2023-01-07 DIAGNOSIS — E785 Hyperlipidemia, unspecified: Secondary | ICD-10-CM | POA: Diagnosis not present

## 2023-01-07 DIAGNOSIS — Z7902 Long term (current) use of antithrombotics/antiplatelets: Secondary | ICD-10-CM | POA: Diagnosis not present

## 2023-01-07 DIAGNOSIS — Z794 Long term (current) use of insulin: Secondary | ICD-10-CM | POA: Diagnosis not present

## 2023-01-07 DIAGNOSIS — Z9181 History of falling: Secondary | ICD-10-CM | POA: Diagnosis not present

## 2023-01-07 DIAGNOSIS — Z466 Encounter for fitting and adjustment of urinary device: Secondary | ICD-10-CM | POA: Diagnosis not present

## 2023-01-07 DIAGNOSIS — Z7984 Long term (current) use of oral hypoglycemic drugs: Secondary | ICD-10-CM | POA: Diagnosis not present

## 2023-01-07 DIAGNOSIS — E1151 Type 2 diabetes mellitus with diabetic peripheral angiopathy without gangrene: Secondary | ICD-10-CM | POA: Diagnosis not present

## 2023-01-07 DIAGNOSIS — I69351 Hemiplegia and hemiparesis following cerebral infarction affecting right dominant side: Secondary | ICD-10-CM | POA: Diagnosis not present

## 2023-01-07 DIAGNOSIS — E1122 Type 2 diabetes mellitus with diabetic chronic kidney disease: Secondary | ICD-10-CM | POA: Diagnosis not present

## 2023-01-07 NOTE — Progress Notes (Signed)
  Care Coordination  Outreach Note  01/07/2023 Name: [name] MRN: [id number] DOB: [birth year]   Care Coordination Outreach Attempts: A third unsuccessful outreach was attempted today to offer the patient with information about available care coordination services.  Follow Up Plan:  No further outreach attempts will be made at this time. We have been unable to contact the patient to offer or enroll patient in care coordination services  Encounter Outcome:  No Answer   Gwenevere Ghazi  Care Coordination Care Guide  Direct Dial: [phone]

## 2023-01-08 ENCOUNTER — Telehealth: Payer: Medicare HMO

## 2023-01-08 NOTE — Telephone Encounter (Signed)
Cheri patients social worker calls nurse line in regards to [place] supplies.   She reports the patient paid 75 dollars for a week supply.   She reports this is not feasible for him every 2 weeks.   She reports she may be able to find funding for him, however would be interested to know if we could find a cheaper option.   Advised will send to Pharmacy Team for review.   Will call Cheri back with an update.

## 2023-01-09 ENCOUNTER — Other Ambulatory Visit (HOSPITAL_COMMUNITY): Payer: Self-pay

## 2023-01-09 ENCOUNTER — Telehealth: Payer: Self-pay | Admitting: Pharmacist

## 2023-01-09 DIAGNOSIS — Z7984 Long term (current) use of oral hypoglycemic drugs: Secondary | ICD-10-CM | POA: Diagnosis not present

## 2023-01-09 DIAGNOSIS — Z466 Encounter for fitting and adjustment of urinary device: Secondary | ICD-10-CM | POA: Diagnosis not present

## 2023-01-09 DIAGNOSIS — E785 Hyperlipidemia, unspecified: Secondary | ICD-10-CM | POA: Diagnosis not present

## 2023-01-09 DIAGNOSIS — E1122 Type 2 diabetes mellitus with diabetic chronic kidney disease: Secondary | ICD-10-CM | POA: Diagnosis not present

## 2023-01-09 DIAGNOSIS — S80821D Blister (nonthermal), right lower leg, subsequent encounter: Secondary | ICD-10-CM | POA: Diagnosis not present

## 2023-01-09 DIAGNOSIS — C61 Malignant neoplasm of prostate: Secondary | ICD-10-CM | POA: Diagnosis not present

## 2023-01-09 DIAGNOSIS — F32A Depression, unspecified: Secondary | ICD-10-CM | POA: Diagnosis not present

## 2023-01-09 DIAGNOSIS — Z7902 Long term (current) use of antithrombotics/antiplatelets: Secondary | ICD-10-CM | POA: Diagnosis not present

## 2023-01-09 DIAGNOSIS — I69351 Hemiplegia and hemiparesis following cerebral infarction affecting right dominant side: Secondary | ICD-10-CM | POA: Diagnosis not present

## 2023-01-09 DIAGNOSIS — Z794 Long term (current) use of insulin: Secondary | ICD-10-CM | POA: Diagnosis not present

## 2023-01-09 DIAGNOSIS — Z9181 History of falling: Secondary | ICD-10-CM | POA: Diagnosis not present

## 2023-01-09 DIAGNOSIS — E1151 Type 2 diabetes mellitus with diabetic peripheral angiopathy without gangrene: Secondary | ICD-10-CM | POA: Diagnosis not present

## 2023-01-09 DIAGNOSIS — N182 Chronic kidney disease, stage 2 (mild): Secondary | ICD-10-CM | POA: Diagnosis not present

## 2023-01-09 DIAGNOSIS — I129 Hypertensive chronic kidney disease with stage 1 through stage 4 chronic kidney disease, or unspecified chronic kidney disease: Secondary | ICD-10-CM | POA: Diagnosis not present

## 2023-01-09 DIAGNOSIS — Z87440 Personal history of urinary (tract) infections: Secondary | ICD-10-CM | POA: Diagnosis not present

## 2023-01-09 DIAGNOSIS — E1165 Type 2 diabetes mellitus with hyperglycemia: Secondary | ICD-10-CM | POA: Diagnosis not present

## 2023-01-09 NOTE — Telephone Encounter (Signed)
Duplicate -  openned in error.

## 2023-01-09 NOTE — Telephone Encounter (Signed)
Patient contacted for follow-up of DEXCOM cost.  Patient phone - no answer -  Called daughter.   We discussed [employer] does not cover the alternate CGM so the current cost of $75 is likely the cost for use on his plan.  I offered that they could try to request a copay reduction from their insurance.   Patient "has not complained" of the CGM per the daughter.  No assessment of benefit at this time.   I am unable to seen CGM report as I don't believe they are sharing data with our practice yet.     Total time with patient call and documentation of interaction: 12 minutes.

## 2023-01-12 DIAGNOSIS — F32A Depression, unspecified: Secondary | ICD-10-CM | POA: Diagnosis not present

## 2023-01-12 DIAGNOSIS — N182 Chronic kidney disease, stage 2 (mild): Secondary | ICD-10-CM | POA: Diagnosis not present

## 2023-01-12 DIAGNOSIS — Z7902 Long term (current) use of antithrombotics/antiplatelets: Secondary | ICD-10-CM | POA: Diagnosis not present

## 2023-01-12 DIAGNOSIS — Z794 Long term (current) use of insulin: Secondary | ICD-10-CM | POA: Diagnosis not present

## 2023-01-12 DIAGNOSIS — Z7984 Long term (current) use of oral hypoglycemic drugs: Secondary | ICD-10-CM | POA: Diagnosis not present

## 2023-01-12 DIAGNOSIS — E1122 Type 2 diabetes mellitus with diabetic chronic kidney disease: Secondary | ICD-10-CM | POA: Diagnosis not present

## 2023-01-12 DIAGNOSIS — E1165 Type 2 diabetes mellitus with hyperglycemia: Secondary | ICD-10-CM | POA: Diagnosis not present

## 2023-01-12 DIAGNOSIS — Z466 Encounter for fitting and adjustment of urinary device: Secondary | ICD-10-CM | POA: Diagnosis not present

## 2023-01-12 DIAGNOSIS — Z87440 Personal history of urinary (tract) infections: Secondary | ICD-10-CM | POA: Diagnosis not present

## 2023-01-12 DIAGNOSIS — E785 Hyperlipidemia, unspecified: Secondary | ICD-10-CM | POA: Diagnosis not present

## 2023-01-12 DIAGNOSIS — S80821D Blister (nonthermal), right lower leg, subsequent encounter: Secondary | ICD-10-CM | POA: Diagnosis not present

## 2023-01-12 DIAGNOSIS — I69351 Hemiplegia and hemiparesis following cerebral infarction affecting right dominant side: Secondary | ICD-10-CM | POA: Diagnosis not present

## 2023-01-12 DIAGNOSIS — E1151 Type 2 diabetes mellitus with diabetic peripheral angiopathy without gangrene: Secondary | ICD-10-CM | POA: Diagnosis not present

## 2023-01-12 DIAGNOSIS — Z9181 History of falling: Secondary | ICD-10-CM | POA: Diagnosis not present

## 2023-01-12 DIAGNOSIS — C61 Malignant neoplasm of prostate: Secondary | ICD-10-CM | POA: Diagnosis not present

## 2023-01-12 DIAGNOSIS — I129 Hypertensive chronic kidney disease with stage 1 through stage 4 chronic kidney disease, or unspecified chronic kidney disease: Secondary | ICD-10-CM | POA: Diagnosis not present

## 2023-01-13 NOTE — Telephone Encounter (Signed)
Reviewed and agree with Dr Koval's plan.   

## 2023-01-20 DIAGNOSIS — E785 Hyperlipidemia, unspecified: Secondary | ICD-10-CM | POA: Diagnosis not present

## 2023-01-20 DIAGNOSIS — Z9181 History of falling: Secondary | ICD-10-CM | POA: Diagnosis not present

## 2023-01-20 DIAGNOSIS — E1165 Type 2 diabetes mellitus with hyperglycemia: Secondary | ICD-10-CM | POA: Diagnosis not present

## 2023-01-20 DIAGNOSIS — F32A Depression, unspecified: Secondary | ICD-10-CM | POA: Diagnosis not present

## 2023-01-20 DIAGNOSIS — C61 Malignant neoplasm of prostate: Secondary | ICD-10-CM | POA: Diagnosis not present

## 2023-01-20 DIAGNOSIS — Z7902 Long term (current) use of antithrombotics/antiplatelets: Secondary | ICD-10-CM | POA: Diagnosis not present

## 2023-01-20 DIAGNOSIS — E1122 Type 2 diabetes mellitus with diabetic chronic kidney disease: Secondary | ICD-10-CM | POA: Diagnosis not present

## 2023-01-20 DIAGNOSIS — Z87440 Personal history of urinary (tract) infections: Secondary | ICD-10-CM | POA: Diagnosis not present

## 2023-01-20 DIAGNOSIS — E1151 Type 2 diabetes mellitus with diabetic peripheral angiopathy without gangrene: Secondary | ICD-10-CM | POA: Diagnosis not present

## 2023-01-20 DIAGNOSIS — I69351 Hemiplegia and hemiparesis following cerebral infarction affecting right dominant side: Secondary | ICD-10-CM | POA: Diagnosis not present

## 2023-01-20 DIAGNOSIS — Z466 Encounter for fitting and adjustment of urinary device: Secondary | ICD-10-CM | POA: Diagnosis not present

## 2023-01-20 DIAGNOSIS — I129 Hypertensive chronic kidney disease with stage 1 through stage 4 chronic kidney disease, or unspecified chronic kidney disease: Secondary | ICD-10-CM | POA: Diagnosis not present

## 2023-01-20 DIAGNOSIS — Z7984 Long term (current) use of oral hypoglycemic drugs: Secondary | ICD-10-CM | POA: Diagnosis not present

## 2023-01-20 DIAGNOSIS — Z794 Long term (current) use of insulin: Secondary | ICD-10-CM | POA: Diagnosis not present

## 2023-01-20 DIAGNOSIS — S80821D Blister (nonthermal), right lower leg, subsequent encounter: Secondary | ICD-10-CM | POA: Diagnosis not present

## 2023-01-20 DIAGNOSIS — N182 Chronic kidney disease, stage 2 (mild): Secondary | ICD-10-CM | POA: Diagnosis not present

## 2023-01-22 DIAGNOSIS — Z87440 Personal history of urinary (tract) infections: Secondary | ICD-10-CM | POA: Diagnosis not present

## 2023-01-22 DIAGNOSIS — C61 Malignant neoplasm of prostate: Secondary | ICD-10-CM | POA: Diagnosis not present

## 2023-01-22 DIAGNOSIS — E785 Hyperlipidemia, unspecified: Secondary | ICD-10-CM | POA: Diagnosis not present

## 2023-01-22 DIAGNOSIS — Z466 Encounter for fitting and adjustment of urinary device: Secondary | ICD-10-CM | POA: Diagnosis not present

## 2023-01-22 DIAGNOSIS — N182 Chronic kidney disease, stage 2 (mild): Secondary | ICD-10-CM | POA: Diagnosis not present

## 2023-01-22 DIAGNOSIS — E1151 Type 2 diabetes mellitus with diabetic peripheral angiopathy without gangrene: Secondary | ICD-10-CM | POA: Diagnosis not present

## 2023-01-22 DIAGNOSIS — I69351 Hemiplegia and hemiparesis following cerebral infarction affecting right dominant side: Secondary | ICD-10-CM | POA: Diagnosis not present

## 2023-01-22 DIAGNOSIS — Z9181 History of falling: Secondary | ICD-10-CM | POA: Diagnosis not present

## 2023-01-22 DIAGNOSIS — F32A Depression, unspecified: Secondary | ICD-10-CM | POA: Diagnosis not present

## 2023-01-22 DIAGNOSIS — E1165 Type 2 diabetes mellitus with hyperglycemia: Secondary | ICD-10-CM | POA: Diagnosis not present

## 2023-01-22 DIAGNOSIS — Z7902 Long term (current) use of antithrombotics/antiplatelets: Secondary | ICD-10-CM | POA: Diagnosis not present

## 2023-01-22 DIAGNOSIS — I129 Hypertensive chronic kidney disease with stage 1 through stage 4 chronic kidney disease, or unspecified chronic kidney disease: Secondary | ICD-10-CM | POA: Diagnosis not present

## 2023-01-22 DIAGNOSIS — Z7984 Long term (current) use of oral hypoglycemic drugs: Secondary | ICD-10-CM | POA: Diagnosis not present

## 2023-01-22 DIAGNOSIS — S80821D Blister (nonthermal), right lower leg, subsequent encounter: Secondary | ICD-10-CM | POA: Diagnosis not present

## 2023-01-22 DIAGNOSIS — E1122 Type 2 diabetes mellitus with diabetic chronic kidney disease: Secondary | ICD-10-CM | POA: Diagnosis not present

## 2023-01-22 DIAGNOSIS — Z794 Long term (current) use of insulin: Secondary | ICD-10-CM | POA: Diagnosis not present

## 2023-01-23 ENCOUNTER — Other Ambulatory Visit: Payer: Self-pay

## 2023-01-23 ENCOUNTER — Other Ambulatory Visit: Payer: Self-pay | Admitting: Family Medicine

## 2023-01-23 ENCOUNTER — Telehealth: Payer: Self-pay

## 2023-01-23 DIAGNOSIS — E1159 Type 2 diabetes mellitus with other circulatory complications: Secondary | ICD-10-CM

## 2023-01-23 DIAGNOSIS — I10 Essential (primary) hypertension: Secondary | ICD-10-CM

## 2023-01-23 MED ORDER — EMPAGLIFLOZIN 10 MG PO TABS
10.0000 mg | ORAL_TABLET | Freq: Every day | ORAL | 0 refills | Status: DC
Start: 2023-01-23 — End: 2023-02-16

## 2023-01-23 MED ORDER — AMLODIPINE BESYLATE 10 MG PO TABS: 10.0000 mg | ORAL_TABLET | Freq: Every day | ORAL | 3 refills | Status: DC

## 2023-01-23 MED ORDER — CARVEDILOL 12.5 MG PO TABS: 12.5000 mg | ORAL_TABLET | Freq: Two times a day (BID) | ORAL | Status: DC

## 2023-01-23 NOTE — Telephone Encounter (Signed)
Patient's daughter calls nurse line regarding issues with Jardiance prescription.  Per chart review, medication was ordered on 8/2 but was set to "No print."  Resent prescription electronically.   Veronda Prude, RN

## 2023-01-23 NOTE — Telephone Encounter (Signed)
Marchelle Folks a Nurse from [facility] is calling to ask Dr Deirdre Priest to fill out a FL2 for Mr Dobish. Marchelle Folks has been working with pt for  approx 2 months and the situation is getting worse. Pt ex wife is not picking up pt meds from the pharmacy. Pt has not had his jariance  in over a month. He is not checking his sugar but still giving himself insulin. Pt has poor hydration, and has rt lower extremity edema from not having his meds. Pt has a wound but ex wife refuses to help with wound care or help with catheter. Pt can not take care of himself. Pt has food all over the house. Maggots are on the table where food has been left and may even be in medication as maggots were all over the bottom of the pill box. He will be discharged from [facility] in 2 weeks if things do not change as this is getting into more care than they can do. CPS is trying to get pt a CNA.  Please let Marchelle Folks know when the form has been filled out or if you have any questions. [phone]  Sunday Spillers, CMA '

## 2023-01-26 ENCOUNTER — Telehealth: Payer: Self-pay | Admitting: Family Medicine

## 2023-01-26 NOTE — Telephone Encounter (Signed)
[name], PT dropped off form at front desk for [facility].  Verified that patient section of form has been completed.  Last DOS/WCC with PCP was 01/02/23.  Placed form in [facility] team folder to be completed by clinical staff.  Vilinda Blanks

## 2023-01-27 NOTE — Telephone Encounter (Signed)
Covering for Dr. Deirdre Priest.  Attempted to call Marchelle Folks to get more information  Questions include: Is she suggestion FL2 be completed so patient can be placed in SNF? Is patient/family willing to be placed in SNF?  Left HIPAA-compliant voicemail. Asked her to call back to answer questions.  Latrelle Dodrill, MD

## 2023-01-27 NOTE — Telephone Encounter (Signed)
Marchelle Folks RN called back and I spoke with her. Summary of conversation: Patient has made no improvement with [facility] services since he started with them Had previously been with SNF for 30 days but insurance benefit ended and he was discharged home Evidently ex wife and daughter are involved but not willing to do all the care necessary for him, and he has high care needs that are not being met at home Home is infested with maggots and HH agency is discharging him next week, they are trying to get plan in place for what will happen to him when [facility] services end APS social worker is involved and reportedly had a family mtg yesterday APS social worker is Cheral Almas, phone # [phone], email [email or web address]  I called APS SW to speak with them and determine exactly what they need from [place] re: FL2. Left HIPAA compliant voicemail (did not leave any PHI on VM).  Will await to hear back from SW for more info.  Latrelle Dodrill, MD

## 2023-01-27 NOTE — Telephone Encounter (Signed)
Left some forms in your box that needs to be completed. 

## 2023-01-28 ENCOUNTER — Telehealth: Payer: Self-pay

## 2023-01-28 NOTE — Telephone Encounter (Signed)
Cherri from APS CSW called back and left msg for me to return call - please see separate encounter. Called her back again and left another HIPAA compliant vm asking her to call back, provided my direct office line.  Latrelle Dodrill, MD

## 2023-01-28 NOTE — Telephone Encounter (Signed)
[name] with [employer] LVM on nurse line returning a call to [facility].   She can be reached at [phone]

## 2023-01-28 NOTE — Telephone Encounter (Signed)
Yes, I did call - see separate phone encounter. Called her back again and left another HIPAA compliant vm asking her to call back, provided my direct office line.  Latrelle Dodrill, MD

## 2023-01-29 DIAGNOSIS — E785 Hyperlipidemia, unspecified: Secondary | ICD-10-CM | POA: Diagnosis not present

## 2023-01-29 DIAGNOSIS — Z466 Encounter for fitting and adjustment of urinary device: Secondary | ICD-10-CM | POA: Diagnosis not present

## 2023-01-29 DIAGNOSIS — E1122 Type 2 diabetes mellitus with diabetic chronic kidney disease: Secondary | ICD-10-CM | POA: Diagnosis not present

## 2023-01-29 DIAGNOSIS — E1151 Type 2 diabetes mellitus with diabetic peripheral angiopathy without gangrene: Secondary | ICD-10-CM | POA: Diagnosis not present

## 2023-01-29 DIAGNOSIS — Z7902 Long term (current) use of antithrombotics/antiplatelets: Secondary | ICD-10-CM | POA: Diagnosis not present

## 2023-01-29 DIAGNOSIS — C61 Malignant neoplasm of prostate: Secondary | ICD-10-CM | POA: Diagnosis not present

## 2023-01-29 DIAGNOSIS — Z9181 History of falling: Secondary | ICD-10-CM | POA: Diagnosis not present

## 2023-01-29 DIAGNOSIS — I129 Hypertensive chronic kidney disease with stage 1 through stage 4 chronic kidney disease, or unspecified chronic kidney disease: Secondary | ICD-10-CM | POA: Diagnosis not present

## 2023-01-29 DIAGNOSIS — E1165 Type 2 diabetes mellitus with hyperglycemia: Secondary | ICD-10-CM | POA: Diagnosis not present

## 2023-01-29 DIAGNOSIS — N182 Chronic kidney disease, stage 2 (mild): Secondary | ICD-10-CM | POA: Diagnosis not present

## 2023-01-29 DIAGNOSIS — F32A Depression, unspecified: Secondary | ICD-10-CM | POA: Diagnosis not present

## 2023-01-29 DIAGNOSIS — S80821D Blister (nonthermal), right lower leg, subsequent encounter: Secondary | ICD-10-CM | POA: Diagnosis not present

## 2023-01-29 DIAGNOSIS — Z794 Long term (current) use of insulin: Secondary | ICD-10-CM | POA: Diagnosis not present

## 2023-01-29 DIAGNOSIS — I69351 Hemiplegia and hemiparesis following cerebral infarction affecting right dominant side: Secondary | ICD-10-CM | POA: Diagnosis not present

## 2023-01-29 DIAGNOSIS — Z87440 Personal history of urinary (tract) infections: Secondary | ICD-10-CM | POA: Diagnosis not present

## 2023-01-29 DIAGNOSIS — Z7984 Long term (current) use of oral hypoglycemic drugs: Secondary | ICD-10-CM | POA: Diagnosis not present

## 2023-02-03 ENCOUNTER — Other Ambulatory Visit: Payer: Self-pay | Admitting: Family Medicine

## 2023-02-03 DIAGNOSIS — Z794 Long term (current) use of insulin: Secondary | ICD-10-CM

## 2023-02-04 DIAGNOSIS — I129 Hypertensive chronic kidney disease with stage 1 through stage 4 chronic kidney disease, or unspecified chronic kidney disease: Secondary | ICD-10-CM | POA: Diagnosis not present

## 2023-02-04 DIAGNOSIS — C61 Malignant neoplasm of prostate: Secondary | ICD-10-CM | POA: Diagnosis not present

## 2023-02-04 DIAGNOSIS — Z9181 History of falling: Secondary | ICD-10-CM | POA: Diagnosis not present

## 2023-02-04 DIAGNOSIS — Z87440 Personal history of urinary (tract) infections: Secondary | ICD-10-CM | POA: Diagnosis not present

## 2023-02-04 DIAGNOSIS — Z7984 Long term (current) use of oral hypoglycemic drugs: Secondary | ICD-10-CM | POA: Diagnosis not present

## 2023-02-04 DIAGNOSIS — E1165 Type 2 diabetes mellitus with hyperglycemia: Secondary | ICD-10-CM | POA: Diagnosis not present

## 2023-02-04 DIAGNOSIS — I69351 Hemiplegia and hemiparesis following cerebral infarction affecting right dominant side: Secondary | ICD-10-CM | POA: Diagnosis not present

## 2023-02-04 DIAGNOSIS — E785 Hyperlipidemia, unspecified: Secondary | ICD-10-CM | POA: Diagnosis not present

## 2023-02-04 DIAGNOSIS — F32A Depression, unspecified: Secondary | ICD-10-CM | POA: Diagnosis not present

## 2023-02-04 DIAGNOSIS — S80821D Blister (nonthermal), right lower leg, subsequent encounter: Secondary | ICD-10-CM | POA: Diagnosis not present

## 2023-02-04 DIAGNOSIS — Z466 Encounter for fitting and adjustment of urinary device: Secondary | ICD-10-CM | POA: Diagnosis not present

## 2023-02-04 DIAGNOSIS — E1151 Type 2 diabetes mellitus with diabetic peripheral angiopathy without gangrene: Secondary | ICD-10-CM | POA: Diagnosis not present

## 2023-02-04 DIAGNOSIS — N182 Chronic kidney disease, stage 2 (mild): Secondary | ICD-10-CM | POA: Diagnosis not present

## 2023-02-04 DIAGNOSIS — E1122 Type 2 diabetes mellitus with diabetic chronic kidney disease: Secondary | ICD-10-CM | POA: Diagnosis not present

## 2023-02-04 DIAGNOSIS — Z794 Long term (current) use of insulin: Secondary | ICD-10-CM | POA: Diagnosis not present

## 2023-02-04 DIAGNOSIS — Z7902 Long term (current) use of antithrombotics/antiplatelets: Secondary | ICD-10-CM | POA: Diagnosis not present

## 2023-02-05 ENCOUNTER — Other Ambulatory Visit: Payer: Self-pay

## 2023-02-05 ENCOUNTER — Other Ambulatory Visit: Payer: Self-pay | Admitting: Family Medicine

## 2023-02-05 DIAGNOSIS — I10 Essential (primary) hypertension: Secondary | ICD-10-CM

## 2023-02-05 DIAGNOSIS — I63522 Cerebral infarction due to unspecified occlusion or stenosis of left anterior cerebral artery: Secondary | ICD-10-CM

## 2023-02-05 MED ORDER — CLOPIDOGREL BISULFATE 75 MG PO TABS: 75.0000 mg | ORAL_TABLET | Freq: Every day | ORAL | 2 refills | Status: AC

## 2023-02-05 NOTE — Telephone Encounter (Signed)
Already ordered

## 2023-02-09 NOTE — Telephone Encounter (Signed)
Forms found in RN box. Called number on paperwork for [facility].   They are requesting that paperwork be faxed to provided number- [phone].  Faxed to this number. Copy made and placed in batch scanning.   Veronda Prude, RN

## 2023-02-13 NOTE — Telephone Encounter (Signed)
Sheri from APS CSW called back today 9/13 and I spoke with her. She provided the following info:  Patient and family desire for him to stay at home if possible. They understand this is potentially not going to be possible due to the level of care he needs. Ex-wife and daughter are doing the best they can to care for him, but are limited by their other commitments When he is in his best state of mind, patient agrees that SNF is the right choice for him, other times he is hesitant Social services is working to find an agency with the CNA to take care of him They would like an FL2 be completed for SNF placement if Inc. that is the route that is needed  [facility] that patient has appointment scheduled this Monday at 4 PM with PCP Dr. Dolan Amen.  If he attends this appointment, I will stop in and see him so that I can complete his FL 2.  [facility]ri Adult nurse. FYI to PCP Dr. Dolan Amen.  Latrelle Dodrill, MD

## 2023-02-16 ENCOUNTER — Ambulatory Visit: Payer: Self-pay | Admitting: Family Medicine

## 2023-02-16 ENCOUNTER — Other Ambulatory Visit: Payer: Self-pay

## 2023-02-16 DIAGNOSIS — E1159 Type 2 diabetes mellitus with other circulatory complications: Secondary | ICD-10-CM

## 2023-02-16 NOTE — Progress Notes (Deleted)
    SUBJECTIVE:   CHIEF COMPLAINT / HPI:   ***  PERTINENT  PMH / PSH: ***  OBJECTIVE:   There were no vitals taken for this visit.  ***  ASSESSMENT/PLAN:   No problem-specific Assessment & Plan notes found for this encounter.     [name], MD [facility]

## 2023-02-17 MED ORDER — METFORMIN HCL 1000 MG PO TABS: 1000.0000 mg | ORAL_TABLET | Freq: Two times a day (BID) | ORAL | 3 refills | Status: AC

## 2023-02-17 MED ORDER — EMPAGLIFLOZIN 10 MG PO TABS
10.0000 mg | ORAL_TABLET | Freq: Every day | ORAL | 3 refills | Status: DC
Start: 2023-02-17 — End: 2023-06-19

## 2023-02-18 ENCOUNTER — Other Ambulatory Visit: Payer: Self-pay | Admitting: Family Medicine

## 2023-02-18 DIAGNOSIS — E1159 Type 2 diabetes mellitus with other circulatory complications: Secondary | ICD-10-CM

## 2023-02-19 MED ORDER — LANTUS SOLOSTAR 100 UNIT/ML ~~LOC~~ SOPN
12.0000 [IU] | PEN_INJECTOR | Freq: Every day | SUBCUTANEOUS | 3 refills | Status: DC
Start: 2023-02-19 — End: 2023-10-12

## 2023-02-25 DIAGNOSIS — R338 Other retention of urine: Secondary | ICD-10-CM | POA: Diagnosis not present

## 2023-03-13 ENCOUNTER — Encounter: Payer: Self-pay | Admitting: Family Medicine

## 2023-03-13 ENCOUNTER — Other Ambulatory Visit: Payer: Self-pay

## 2023-03-13 ENCOUNTER — Ambulatory Visit: Payer: Medicare HMO | Admitting: Family Medicine

## 2023-03-13 VITALS — BP 190/69 | HR 70 | Wt 206.0 lb

## 2023-03-13 DIAGNOSIS — Z794 Long term (current) use of insulin: Secondary | ICD-10-CM

## 2023-03-13 DIAGNOSIS — I10 Essential (primary) hypertension: Secondary | ICD-10-CM | POA: Diagnosis not present

## 2023-03-13 DIAGNOSIS — E1159 Type 2 diabetes mellitus with other circulatory complications: Secondary | ICD-10-CM

## 2023-03-13 DIAGNOSIS — E782 Mixed hyperlipidemia: Secondary | ICD-10-CM

## 2023-03-13 DIAGNOSIS — Z23 Encounter for immunization: Secondary | ICD-10-CM

## 2023-03-13 DIAGNOSIS — Z8679 Personal history of other diseases of the circulatory system: Secondary | ICD-10-CM

## 2023-03-13 DIAGNOSIS — Z5982 Transportation insecurity: Secondary | ICD-10-CM

## 2023-03-13 MED ORDER — AMLODIPINE-OLMESARTAN 10-20 MG PO TABS
1.0000 | ORAL_TABLET | Freq: Every day | ORAL | 0 refills | Status: DC
Start: 2023-03-13 — End: 2023-04-06

## 2023-03-13 NOTE — Patient Instructions (Addendum)
Thank you for coming in today!  Things we discussed today: I have changed your blood pressure medications. You will start taking a combination medication called Azor once daily. Please stop taking the amlodipine. 2. I am checking some routine labs today. I will call you or send you a letter with the results. 3. You have 2 refills of the plavix left. Please call the office or send me a message if you are not able to get this medication. 3. Please go to a pharmacy and ask for a shingles vaccine and a Tdap vaccine. Please bring documentation of this to your next appointment.   Please make an appointment to see me in 2 weeks to check your blood pressure and change your CGM. Please bring your CGM with you.  Have a great day!

## 2023-03-13 NOTE — Assessment & Plan Note (Addendum)
BP: (!) 190/69 today. Poorly controlled. Had not taken blood pressure medications yet today. Readings at home are in the 150s. Goal of <140.  Starting Azor 10/20 today. Follow up in 2 weeks.  Medication regimen: carvedilol 12.5mg  BID, Azor 10-20  Checking BMP today

## 2023-03-13 NOTE — Progress Notes (Addendum)
SUBJECTIVE:   CHIEF COMPLAINT / HPI:    Diabetes mellitus -medications: 12u long acting insulin, metformin, jardiance -BG trend in the 160s-180s -pt not able to apply new CGM, struggles to do this with one hand  HTN -carvedilol 12.5mg  BID and amlodipine 10mg  nightly- taking daily -checking BP at home, around 150 systolic  Transportation insecurity -care management referral placed in August, attempted contact 3 times without success, daughter wants [place] to give them her phone number  Healthcare maintenance -eye exam- not yet -podiatry appt- not yet -labs today- lipid panel, BMP, microalbumin/cr -review medications  Social situation -family was requesting FL2 paperwork previously to get pt to SNF as he is unable to care for himself and family cannot provide full time care -today, daughter states that he does not have a home health nurse or housekeeper but that they are in the process of getting a new one for him. She is unsure whether this will be a CNA or not. Daughter states the patient is refusing SNF at this time and states that the patient will only go when he "has to", which she believes will be soon.   PERTINENT  PMH / PSH: CVA with R hemiparesis, HTN, PAD, DMII, prostate cancer with chronic foley  OBJECTIVE:   BP (!) 190/69   Pulse 70   Wt 206 lb (93.4 kg)   SpO2 97%   BMI 35.36 kg/m    - General: Elderly, chronically ill appearing male in no acute distress. Awake and conversant. - Eyes: Normal conjunctiva, anicteric. Round symmetric pupils. - ENT: Hearing grossly intact. No nasal discharge. - Neck: Neck is supple. No masses or thyromegaly. - Respiratory: Respirations are non-labored. - Skin: chronic venous insufficiency skin changes to BLE with small scabs on R shin, no signs of infection - Psych: Alert and oriented. Cooperative, Appropriate mood and affect, Normal judgment. - MSK: wheelchair bound with R sided hemiplegia   ASSESSMENT/PLAN:   Essential  hypertension, benign BP: (!) 190/69 today. Poorly controlled. Had not taken blood pressure medications yet today. Readings at home are in the 150s. Goal of <140.  Starting Azor 10/20 today. Follow up in 2 weeks.  Medication regimen: carvedilol 12.5mg  BID, Azor 10-20 today  Checking BMP today   T2DM (type 2 diabetes mellitus) (HCC) Moderately well controlled. CGM has been expired for a while but reports readings are in the 160s-180s and has been compliant with medications. Pt struggles to apply CGM given R sided hemiplegia. Applied new CGM in office today. Pt has app on phone and knows how to check it.  - Last A1c:  Lab Results  Component Value Date   HGBA1C 9.1 (A) 01/02/2023   - Medications: 12u long acting insulin daily, jardiance, metformin - Compliance: good per patient, daughter is unsure of this - checking microalbumin/cr ratio today - advised pt and daughter to schedule diabetic eye exam and podiatry appointments - return in 2 weeks to apply new CGM and check reports   Transportation insecurity Pt relies on daughter for transportation to appointments, daughter requesting assistance with this as she works full time and cannot manage taking patient to all his medical appointments and other transportation needs. Referral to community care coordination placed previously but were unable to contact patient.  -new referral placed with patient's daughter's information   Social situation Although patient reports compliance with medications, his blood pressure was quite high today and he had not replaced his CGM in several weeks. Per daughter, patient refusing SNF at  this time but it will likely become necessary soon. Patient has complex medical needs including catheter maintenance that he is unable to perform independently. Will continue to discuss this with patient and family.  [name], MD [facility]

## 2023-03-13 NOTE — Assessment & Plan Note (Signed)
Checking lipid panel today. 

## 2023-03-13 NOTE — Assessment & Plan Note (Signed)
Pt relies on daughter for transportation to appointments, daughter requesting assistance with this as she works full time and cannot manage taking patient to all his medical appointments and other transportation needs. Referral to community care coordination placed previously but were unable to contact patient.  -new referral placed with patient's daughter's information

## 2023-03-13 NOTE — Assessment & Plan Note (Addendum)
Moderately well controlled. CGM has been expired for a while but reports readings are in the 160s-180s and has been compliant with medications. Pt struggles to apply CGM given R sided hemiplegia. Applied new CGM in office today. Pt has app on phone and knows how to check it.  - Last A1c:  Lab Results  Component Value Date   HGBA1C 9.1 (A) 01/02/2023   - Medications: 12u long acting insulin daily, jardiance, metformin - Compliance: good per patient, daughter is unsure of this - checking microalbumin/cr ratio today - advised pt and daughter to schedule diabetic eye exam and podiatry appointments - return in 2 weeks to apply new CGM and check reports

## 2023-03-14 LAB — BASIC METABOLIC PANEL
BUN/Creatinine Ratio: 21 (ref 10–24)
BUN: 29 mg/dL — ABNORMAL HIGH (ref 8–27)
CO2: 18 mmol/L — ABNORMAL LOW (ref 20–29)
Calcium: 8.8 mg/dL (ref 8.6–10.2)
Chloride: 102 mmol/L (ref 96–106)
Creatinine, Ser: 1.38 mg/dL — ABNORMAL HIGH (ref 0.76–1.27)
Glucose: 217 mg/dL — ABNORMAL HIGH (ref 70–99)
Potassium: 4.2 mmol/L (ref 3.5–5.2)
Sodium: 137 mmol/L (ref 134–144)
eGFR: 54 mL/min/{1.73_m2} — ABNORMAL LOW (ref >59)

## 2023-03-14 LAB — MICROALBUMIN / CREATININE URINE RATIO
Creatinine, Urine: 27.2 mg/dL
Microalb/Creat Ratio: 862 mg/g{creat} — ABNORMAL HIGH (ref 0–29)
Microalbumin, Urine: 234.5 ug/mL

## 2023-03-14 LAB — LIPID PANEL
Chol/HDL Ratio: 2.5 {ratio} (ref 0.0–5.0)
Cholesterol, Total: 145 mg/dL (ref 100–199)
HDL: 58 mg/dL (ref >39)
LDL Chol Calc (NIH): 70 mg/dL (ref 0–99)
Triglycerides: 93 mg/dL (ref 0–149)
VLDL Cholesterol Cal: 17 mg/dL (ref 5–40)

## 2023-03-16 ENCOUNTER — Telehealth: Payer: Self-pay

## 2023-03-16 ENCOUNTER — Telehealth: Payer: Self-pay | Admitting: *Deleted

## 2023-03-16 ENCOUNTER — Encounter: Payer: Self-pay | Admitting: Family Medicine

## 2023-03-16 NOTE — Progress Notes (Signed)
Care Coordination   Note   03/16/2023 Name: YOUSOF Chapman MRN: [id number] DOB: 1950/09/16  [name] is a 72 y.o. year old male who sees Hindel, Leah, MD for primary care. I reached out to [name] by phone today to offer care coordination services.  [name] was given information about Care Coordination services today including:   The Care Coordination services include support from the care team which includes your Nurse Coordinator, Clinical Social Worker, or Pharmacist.  The Care Coordination team is here to help remove barriers to the health concerns and goals most important to Kyle. Care Coordination services are voluntary, and the patient may decline or stop services at any time by request to their care team member.   Care Coordination Consent Status: Patient daughter [name] DPR on file agreed to services and verbal consent obtained.   Follow up plan:  Telephone appointment with care coordination team member scheduled for:  10/23  Encounter Outcome:  Patient Scheduled  [facility] Coordination Care Guide  Direct Dial: [phone]

## 2023-03-16 NOTE — Telephone Encounter (Signed)
Patient calls nurse line in regards to results.   She reports she just missed a call from PCP.  Discussed results with daughter and advised a letter was being mailed to the patient.   All questions answered.   Advised of FU apt scheduled for 10/24.

## 2023-03-18 ENCOUNTER — Other Ambulatory Visit: Payer: Self-pay

## 2023-03-18 DIAGNOSIS — I10 Essential (primary) hypertension: Secondary | ICD-10-CM

## 2023-03-18 MED ORDER — CARVEDILOL 12.5 MG PO TABS: 12.5000 mg | ORAL_TABLET | Freq: Two times a day (BID) | ORAL | 3 refills | Status: AC

## 2023-03-19 NOTE — Telephone Encounter (Signed)
Called patient to discuss recent lab results, no answer, Left HIPAA compliant voicemail. Called patient's daughter, Karoline Caldwell, no answer. Left HIPAA compliant voicemail. Sent letter with results. Has appointment with Dr Rexene Alberts on 10/24, will advise provider to discuss lab results at this time and consider nephrology referral if pt and family interested.

## 2023-03-25 ENCOUNTER — Ambulatory Visit: Payer: Self-pay

## 2023-03-25 NOTE — Patient Instructions (Signed)
Visit Information  Thank you for taking time to visit with me today. Please don't hesitate to contact me if I can be of assistance to you.   Following are the goals we discussed today:   Goals Addressed             This Visit's Progress    COMPLETED: Care Coordination Activities       Care Coordination Interventions: Discussed the patient would benefit from more resources in the home. Daughter provides transportation and is actively completing a Scientist, forensic ([employer]) Determined the patient is unable to prepare his own meals. The family is interested in resources to assist with ready made meals. The patient is currently on the wait list to receive meals on wheels Determined the patient does not have concerns with utility costs at this time; the patient access DSS funding through [employer] for assistance when needed Discussed the patient has applied for Medicaid in the past but does not qualify Education provided on the [facility] in home aid program - referral placed. Family understands there is a wait list Provided both verbal and written education on [facility] - referral placed. Daughter requests [facility] contact her mother Lowry Benthall to discuss enrollment Discussed patients daughter will outreach SW as needed; no follow up scheduled at this time per family request due to work schedule         If you are experiencing a Mental Health or Behavioral Health Crisis or need someone to talk to, please call 1-800-273-TALK (toll free, 24 hour hotline) go to [facility] [address] [place] [phone]) call 911  The patient verbalized understanding of instructions, educational materials, and care plan provided today and DECLINED offer to receive copy of patient instructions, educational materials, and care plan.   No further follow up required: Please contact your primary care provider as needed.  Bevelyn Ngo, BSW, CDP [facility]   [facility], [facility] Social Worker Direct Dial: [phone]  Fax: [phone]

## 2023-03-25 NOTE — Patient Outreach (Signed)
Care Coordination   Initial Visit Note   03/25/2023 Name: [name] MRN: [id number] DOB: 03-04-1951  [name] is a 72 y.o. year old male who sees Hindel, Leah, MD for primary care. I  spoke with the patients daughter [name] to assist with care coordination needs.  What matters to the patients health and wellness today?  Discussed the patients family would like to identify resources to assist with care in the home. Referrals have been placed to [facility] and [facility] in home aid program by SW. The patients daughter understands she will be contacted directly by these community resources.    Goals Addressed             This Visit's Progress    COMPLETED: Care Coordination Activities       Care Coordination Interventions: Discussed the patient would benefit from more resources in the home. Daughter provides transportation and is actively completing a Scientist, forensic (SCAT) Determined the patient is unable to prepare his own meals. The family is interested in resources to assist with ready made meals. The patient is currently on the wait list to receive meals on wheels Determined the patient does not have concerns with utility costs at this time; the patient access DSS funding through [employer] for assistance when needed Discussed the patient has applied for Medicaid in the past but does not qualify Education provided on the [facility] in home aid program - referral placed. Family understands there is a wait list Provided both verbal and written education on [facility] - referral placed. Daughter requests [facility] contact her mother [name] to discuss enrollment Discussed patients daughter will outreach SW as needed; no follow up scheduled at this time per family request due to work schedule         SDOH assessments and interventions completed:  Yes  SDOH Interventions Today    Flowsheet Row Most Recent Value  SDOH Interventions   Food  Insecurity Interventions Other (Comment)  [needs easy to prepare meals,  on wait list for meals on wheels]  Housing Interventions Intervention Not Indicated  Transportation Interventions Other (Comment)  [Daughter working on SCAT]  Utilities Interventions Intervention Not Indicated        Care Coordination Interventions:  Yes, provided   Interventions Today    Flowsheet Row Most Recent Value  Chronic Disease   Chronic disease during today's visit Other  [History of CVA with R sided hemiplegia, caregiver resources]  General Interventions   General Interventions Discussed/Reviewed General Interventions Discussed, Tax adviser to the [employer] in home aid program,  referral to [employer] [facility]]  Education Interventions   Education Provided Provided Education  Provided Engineer, petroleum [employer] a [facility] brochure for review]  Nutrition Interventions   Nutrition Discussed/Reviewed Nutrition Discussed  Charlyne Mom is on the wait list to receive meals on wheels]        Follow up plan: No further intervention required. Referral made to [facility] and [facility] in home aid program.    Encounter Outcome:  Patient Visit Completed   Bevelyn Ngo, BSW, CDP [facility]  [facility], [facility] Social Worker Direct Dial: [phone]  Fax: [phone]

## 2023-03-26 ENCOUNTER — Ambulatory Visit: Payer: Self-pay | Admitting: Family Medicine

## 2023-04-02 ENCOUNTER — Ambulatory Visit: Payer: Medicare HMO | Admitting: Family Medicine

## 2023-04-02 VITALS — BP 134/65 | HR 52 | Ht 64.0 in

## 2023-04-02 DIAGNOSIS — I10 Essential (primary) hypertension: Secondary | ICD-10-CM | POA: Diagnosis not present

## 2023-04-02 DIAGNOSIS — I63522 Cerebral infarction due to unspecified occlusion or stenosis of left anterior cerebral artery: Secondary | ICD-10-CM | POA: Diagnosis not present

## 2023-04-02 DIAGNOSIS — E1159 Type 2 diabetes mellitus with other circulatory complications: Secondary | ICD-10-CM | POA: Diagnosis not present

## 2023-04-02 DIAGNOSIS — R1110 Vomiting, unspecified: Secondary | ICD-10-CM

## 2023-04-02 DIAGNOSIS — Z794 Long term (current) use of insulin: Secondary | ICD-10-CM | POA: Diagnosis not present

## 2023-04-02 MED ORDER — PANTOPRAZOLE SODIUM 20 MG PO TBEC: 20.0000 mg | DELAYED_RELEASE_TABLET | Freq: Every day | ORAL | 0 refills | Status: DC

## 2023-04-02 MED ORDER — FAMOTIDINE 20 MG PO TABS: 20.0000 mg | ORAL_TABLET | Freq: Two times a day (BID) | ORAL | 0 refills | Status: DC

## 2023-04-02 NOTE — Assessment & Plan Note (Signed)
Patient needs assistance with his home medical care due to residual right-sided weakness from prior CVA.  Unable to replace his CGM, changes Foley bag, take his medications without difficulty - Home health referral placed today

## 2023-04-02 NOTE — Assessment & Plan Note (Addendum)
Stable, continue Azor 10-20 mg tablet - will obtain BMP at next visit

## 2023-04-02 NOTE — Assessment & Plan Note (Signed)
I replaced patient's CGM today over his left pectoralis muscle and removed the 1 that was already in place.  He has difficulty changing them himself.  He is scheduled for follow-up appointment in 2 weeks and I instructed him to bring his CGM at that time so we can replace it for him

## 2023-04-02 NOTE — Progress Notes (Signed)
    SUBJECTIVE:   CHIEF COMPLAINT / HPI:   Patient presented for follow-up of hypertension after being started on Azor at his last appointment with his PCP on 10/11 No issues or side effects with this medication  Also needs his CGM replaced.  States that he has trouble replacing it on his own because he only has mobility in his left arm  Relative at bedside states that patient had home health coming out, however they stopped coming in the beginning of September.  States that he needs help with all medical things at home including changing out his Foley bag, replacing his CGM, taking his medications  Relative also reports that patient has been vomiting after eating.  Patient states that this has been going on for many years.  He reports that unless he drinks something carbonated after eating, he will regurgitate his food.  He has never seen a gastroenterologist.  No history of GERD.  No history EGD.  PERTINENT  PMH / PSH: Hypertension, PAD, CVA, T2DM, spastic hemiparesis, stage II chronic kidney disease, prostate cancer, CVA with dysarthria  OBJECTIVE:   BP 134/65   Pulse (!) 52   Ht 5\' 4"  (1.626 m)   SpO2 99%   BMI 35.36 kg/m   General: In wheelchair, chronically ill-appearing CGM in place over right pectoralis muscle, clean dry and intact Skin changes over right lower extremity, see photo in media tab  ASSESSMENT/PLAN:   Essential hypertension, benign Stable, continue Azor 10-20 mg tablet - will obtain BMP at next visit  T2DM (type 2 diabetes mellitus) (HCC) I replaced patient's CGM today over his left pectoralis muscle and removed the 1 that was already in place.  He has difficulty changing them himself.  He is scheduled for follow-up appointment in 2 weeks and I instructed him to bring his CGM at that time so we can replace it for him  Regurgitation of food Patient reports regurgitation of food for "many years ", states that it happens after he eats unless he drinks  something carbonated. - Referral to GI - Protonix 20 mg daily - Follow-up at next appointment  Cerebrovascular accident (CVA) [facility]) Patient needs assistance with his home medical care due to residual right-sided weakness from prior CVA.  Unable to replace his CGM, changes Foley bag, take his medications without difficulty - Home health referral placed today     Para March, DO [facility]

## 2023-04-02 NOTE — Assessment & Plan Note (Signed)
Patient reports regurgitation of food for "many years ", states that it happens after he eats unless he drinks something carbonated. - Referral to GI - Protonix 20 mg daily - Follow-up at next appointment

## 2023-04-02 NOTE — Patient Instructions (Addendum)
It was great to see you today! Thank you for choosing [facility] for your primary care. [name] was seen for CGM replacement, blood pressure check.  Today we addressed: Please start taking famotidine 20 mg twice a day for your nausea and vomiting after eating.  We will reevaluate this at your next appointment.  We may end up referring you to a gastrointestinal doctor for further evaluation. We will replace your CGM at your next appointment until home health is established, please make sure to bring your CGM with you to your next appointment. I have submitted a new order for home health services.  And they should be calling you soon  If you haven't already, sign up for My Chart to have easy access to your labs results, and communication with your primary care physician.  Call the clinic at [phone] if your symptoms worsen or you have any concerns.  You should return to our clinic 11/19 at 4:00pm with Dr. Georg Ruddle Please arrive 15 minutes before your appointment to ensure smooth check in process.  We appreciate your efforts in making this happen.  Thank you for allowing me to participate in your care, Para March, DO 04/02/2023, 5:01 PM PGY-1, [facility]

## 2023-04-06 ENCOUNTER — Other Ambulatory Visit: Payer: Self-pay | Admitting: Family Medicine

## 2023-04-06 DIAGNOSIS — I10 Essential (primary) hypertension: Secondary | ICD-10-CM

## 2023-04-07 DIAGNOSIS — R338 Other retention of urine: Secondary | ICD-10-CM | POA: Diagnosis not present

## 2023-04-09 ENCOUNTER — Telehealth: Payer: Self-pay

## 2023-04-09 NOTE — Telephone Encounter (Signed)
Lynette from [facility] called pt to schedule a visit. Mom is sick so daughter does not want to start care for a few days. [facility] will try for a weekend visit.  Clemencia Course, CMA

## 2023-04-12 DIAGNOSIS — I129 Hypertensive chronic kidney disease with stage 1 through stage 4 chronic kidney disease, or unspecified chronic kidney disease: Secondary | ICD-10-CM | POA: Diagnosis not present

## 2023-04-12 DIAGNOSIS — I872 Venous insufficiency (chronic) (peripheral): Secondary | ICD-10-CM | POA: Diagnosis not present

## 2023-04-12 DIAGNOSIS — Z7984 Long term (current) use of oral hypoglycemic drugs: Secondary | ICD-10-CM | POA: Diagnosis not present

## 2023-04-12 DIAGNOSIS — Z7902 Long term (current) use of antithrombotics/antiplatelets: Secondary | ICD-10-CM | POA: Diagnosis not present

## 2023-04-12 DIAGNOSIS — Z960 Presence of urogenital implants: Secondary | ICD-10-CM | POA: Diagnosis not present

## 2023-04-12 DIAGNOSIS — I69322 Dysarthria following cerebral infarction: Secondary | ICD-10-CM | POA: Diagnosis not present

## 2023-04-12 DIAGNOSIS — L97811 Non-pressure chronic ulcer of other part of right lower leg limited to breakdown of skin: Secondary | ICD-10-CM | POA: Diagnosis not present

## 2023-04-12 DIAGNOSIS — E1122 Type 2 diabetes mellitus with diabetic chronic kidney disease: Secondary | ICD-10-CM | POA: Diagnosis not present

## 2023-04-12 DIAGNOSIS — Z8546 Personal history of malignant neoplasm of prostate: Secondary | ICD-10-CM | POA: Diagnosis not present

## 2023-04-12 DIAGNOSIS — Z7952 Long term (current) use of systemic steroids: Secondary | ICD-10-CM | POA: Diagnosis not present

## 2023-04-12 DIAGNOSIS — E1151 Type 2 diabetes mellitus with diabetic peripheral angiopathy without gangrene: Secondary | ICD-10-CM | POA: Diagnosis not present

## 2023-04-12 DIAGNOSIS — I69351 Hemiplegia and hemiparesis following cerebral infarction affecting right dominant side: Secondary | ICD-10-CM | POA: Diagnosis not present

## 2023-04-12 DIAGNOSIS — N182 Chronic kidney disease, stage 2 (mild): Secondary | ICD-10-CM | POA: Diagnosis not present

## 2023-04-12 DIAGNOSIS — Z9181 History of falling: Secondary | ICD-10-CM | POA: Diagnosis not present

## 2023-04-15 DIAGNOSIS — E1122 Type 2 diabetes mellitus with diabetic chronic kidney disease: Secondary | ICD-10-CM | POA: Diagnosis not present

## 2023-04-15 DIAGNOSIS — I69322 Dysarthria following cerebral infarction: Secondary | ICD-10-CM | POA: Diagnosis not present

## 2023-04-15 DIAGNOSIS — Z960 Presence of urogenital implants: Secondary | ICD-10-CM | POA: Diagnosis not present

## 2023-04-15 DIAGNOSIS — Z7952 Long term (current) use of systemic steroids: Secondary | ICD-10-CM | POA: Diagnosis not present

## 2023-04-15 DIAGNOSIS — Z7984 Long term (current) use of oral hypoglycemic drugs: Secondary | ICD-10-CM | POA: Diagnosis not present

## 2023-04-15 DIAGNOSIS — L97811 Non-pressure chronic ulcer of other part of right lower leg limited to breakdown of skin: Secondary | ICD-10-CM | POA: Diagnosis not present

## 2023-04-15 DIAGNOSIS — Z9181 History of falling: Secondary | ICD-10-CM | POA: Diagnosis not present

## 2023-04-15 DIAGNOSIS — E1151 Type 2 diabetes mellitus with diabetic peripheral angiopathy without gangrene: Secondary | ICD-10-CM | POA: Diagnosis not present

## 2023-04-15 DIAGNOSIS — Z7902 Long term (current) use of antithrombotics/antiplatelets: Secondary | ICD-10-CM | POA: Diagnosis not present

## 2023-04-15 DIAGNOSIS — N182 Chronic kidney disease, stage 2 (mild): Secondary | ICD-10-CM | POA: Diagnosis not present

## 2023-04-15 DIAGNOSIS — Z8546 Personal history of malignant neoplasm of prostate: Secondary | ICD-10-CM | POA: Diagnosis not present

## 2023-04-15 DIAGNOSIS — I129 Hypertensive chronic kidney disease with stage 1 through stage 4 chronic kidney disease, or unspecified chronic kidney disease: Secondary | ICD-10-CM | POA: Diagnosis not present

## 2023-04-15 DIAGNOSIS — I872 Venous insufficiency (chronic) (peripheral): Secondary | ICD-10-CM | POA: Diagnosis not present

## 2023-04-15 DIAGNOSIS — I69351 Hemiplegia and hemiparesis following cerebral infarction affecting right dominant side: Secondary | ICD-10-CM | POA: Diagnosis not present

## 2023-04-17 DIAGNOSIS — Z8546 Personal history of malignant neoplasm of prostate: Secondary | ICD-10-CM | POA: Diagnosis not present

## 2023-04-17 DIAGNOSIS — Z7984 Long term (current) use of oral hypoglycemic drugs: Secondary | ICD-10-CM | POA: Diagnosis not present

## 2023-04-17 DIAGNOSIS — Z7902 Long term (current) use of antithrombotics/antiplatelets: Secondary | ICD-10-CM | POA: Diagnosis not present

## 2023-04-17 DIAGNOSIS — I69322 Dysarthria following cerebral infarction: Secondary | ICD-10-CM | POA: Diagnosis not present

## 2023-04-17 DIAGNOSIS — I872 Venous insufficiency (chronic) (peripheral): Secondary | ICD-10-CM | POA: Diagnosis not present

## 2023-04-17 DIAGNOSIS — Z960 Presence of urogenital implants: Secondary | ICD-10-CM | POA: Diagnosis not present

## 2023-04-17 DIAGNOSIS — L97811 Non-pressure chronic ulcer of other part of right lower leg limited to breakdown of skin: Secondary | ICD-10-CM | POA: Diagnosis not present

## 2023-04-17 DIAGNOSIS — N182 Chronic kidney disease, stage 2 (mild): Secondary | ICD-10-CM | POA: Diagnosis not present

## 2023-04-17 DIAGNOSIS — E1151 Type 2 diabetes mellitus with diabetic peripheral angiopathy without gangrene: Secondary | ICD-10-CM | POA: Diagnosis not present

## 2023-04-17 DIAGNOSIS — Z9181 History of falling: Secondary | ICD-10-CM | POA: Diagnosis not present

## 2023-04-17 DIAGNOSIS — I69351 Hemiplegia and hemiparesis following cerebral infarction affecting right dominant side: Secondary | ICD-10-CM | POA: Diagnosis not present

## 2023-04-17 DIAGNOSIS — Z7952 Long term (current) use of systemic steroids: Secondary | ICD-10-CM | POA: Diagnosis not present

## 2023-04-17 DIAGNOSIS — E1122 Type 2 diabetes mellitus with diabetic chronic kidney disease: Secondary | ICD-10-CM | POA: Diagnosis not present

## 2023-04-17 DIAGNOSIS — I129 Hypertensive chronic kidney disease with stage 1 through stage 4 chronic kidney disease, or unspecified chronic kidney disease: Secondary | ICD-10-CM | POA: Diagnosis not present

## 2023-04-20 ENCOUNTER — Telehealth: Payer: Self-pay

## 2023-04-20 DIAGNOSIS — Z8546 Personal history of malignant neoplasm of prostate: Secondary | ICD-10-CM | POA: Diagnosis not present

## 2023-04-20 DIAGNOSIS — E1122 Type 2 diabetes mellitus with diabetic chronic kidney disease: Secondary | ICD-10-CM | POA: Diagnosis not present

## 2023-04-20 DIAGNOSIS — Z7952 Long term (current) use of systemic steroids: Secondary | ICD-10-CM | POA: Diagnosis not present

## 2023-04-20 DIAGNOSIS — I69351 Hemiplegia and hemiparesis following cerebral infarction affecting right dominant side: Secondary | ICD-10-CM | POA: Diagnosis not present

## 2023-04-20 DIAGNOSIS — I69322 Dysarthria following cerebral infarction: Secondary | ICD-10-CM | POA: Diagnosis not present

## 2023-04-20 DIAGNOSIS — E1151 Type 2 diabetes mellitus with diabetic peripheral angiopathy without gangrene: Secondary | ICD-10-CM | POA: Diagnosis not present

## 2023-04-20 DIAGNOSIS — I129 Hypertensive chronic kidney disease with stage 1 through stage 4 chronic kidney disease, or unspecified chronic kidney disease: Secondary | ICD-10-CM | POA: Diagnosis not present

## 2023-04-20 DIAGNOSIS — Z7984 Long term (current) use of oral hypoglycemic drugs: Secondary | ICD-10-CM | POA: Diagnosis not present

## 2023-04-20 DIAGNOSIS — Z7902 Long term (current) use of antithrombotics/antiplatelets: Secondary | ICD-10-CM | POA: Diagnosis not present

## 2023-04-20 DIAGNOSIS — Z960 Presence of urogenital implants: Secondary | ICD-10-CM | POA: Diagnosis not present

## 2023-04-20 DIAGNOSIS — I872 Venous insufficiency (chronic) (peripheral): Secondary | ICD-10-CM | POA: Diagnosis not present

## 2023-04-20 DIAGNOSIS — L97811 Non-pressure chronic ulcer of other part of right lower leg limited to breakdown of skin: Secondary | ICD-10-CM | POA: Diagnosis not present

## 2023-04-20 DIAGNOSIS — Z9181 History of falling: Secondary | ICD-10-CM | POA: Diagnosis not present

## 2023-04-20 DIAGNOSIS — N182 Chronic kidney disease, stage 2 (mild): Secondary | ICD-10-CM | POA: Diagnosis not present

## 2023-04-20 NOTE — Telephone Encounter (Signed)
Stephanie from [facility] calling for OT verbal orders as follows:  1 time(s) weekly for 4 week(s), then Once per week every other week for two weeks.   Verbal orders given per [facility] protocol  Veronda Prude, RN

## 2023-04-21 ENCOUNTER — Ambulatory Visit: Payer: Self-pay | Admitting: Family Medicine

## 2023-04-22 DIAGNOSIS — I69322 Dysarthria following cerebral infarction: Secondary | ICD-10-CM | POA: Diagnosis not present

## 2023-04-22 DIAGNOSIS — I872 Venous insufficiency (chronic) (peripheral): Secondary | ICD-10-CM | POA: Diagnosis not present

## 2023-04-22 DIAGNOSIS — Z7952 Long term (current) use of systemic steroids: Secondary | ICD-10-CM | POA: Diagnosis not present

## 2023-04-22 DIAGNOSIS — Z8546 Personal history of malignant neoplasm of prostate: Secondary | ICD-10-CM | POA: Diagnosis not present

## 2023-04-22 DIAGNOSIS — Z960 Presence of urogenital implants: Secondary | ICD-10-CM | POA: Diagnosis not present

## 2023-04-22 DIAGNOSIS — Z9181 History of falling: Secondary | ICD-10-CM | POA: Diagnosis not present

## 2023-04-22 DIAGNOSIS — I69351 Hemiplegia and hemiparesis following cerebral infarction affecting right dominant side: Secondary | ICD-10-CM | POA: Diagnosis not present

## 2023-04-22 DIAGNOSIS — Z7902 Long term (current) use of antithrombotics/antiplatelets: Secondary | ICD-10-CM | POA: Diagnosis not present

## 2023-04-22 DIAGNOSIS — E1151 Type 2 diabetes mellitus with diabetic peripheral angiopathy without gangrene: Secondary | ICD-10-CM | POA: Diagnosis not present

## 2023-04-22 DIAGNOSIS — I129 Hypertensive chronic kidney disease with stage 1 through stage 4 chronic kidney disease, or unspecified chronic kidney disease: Secondary | ICD-10-CM | POA: Diagnosis not present

## 2023-04-22 DIAGNOSIS — N182 Chronic kidney disease, stage 2 (mild): Secondary | ICD-10-CM | POA: Diagnosis not present

## 2023-04-22 DIAGNOSIS — E1122 Type 2 diabetes mellitus with diabetic chronic kidney disease: Secondary | ICD-10-CM | POA: Diagnosis not present

## 2023-04-22 DIAGNOSIS — L97811 Non-pressure chronic ulcer of other part of right lower leg limited to breakdown of skin: Secondary | ICD-10-CM | POA: Diagnosis not present

## 2023-04-22 DIAGNOSIS — Z7984 Long term (current) use of oral hypoglycemic drugs: Secondary | ICD-10-CM | POA: Diagnosis not present

## 2023-04-26 ENCOUNTER — Other Ambulatory Visit: Payer: Self-pay | Admitting: Family Medicine

## 2023-04-26 DIAGNOSIS — R1110 Vomiting, unspecified: Secondary | ICD-10-CM

## 2023-04-27 DIAGNOSIS — N182 Chronic kidney disease, stage 2 (mild): Secondary | ICD-10-CM | POA: Diagnosis not present

## 2023-04-27 DIAGNOSIS — Z9181 History of falling: Secondary | ICD-10-CM | POA: Diagnosis not present

## 2023-04-27 DIAGNOSIS — Z7984 Long term (current) use of oral hypoglycemic drugs: Secondary | ICD-10-CM | POA: Diagnosis not present

## 2023-04-27 DIAGNOSIS — Z960 Presence of urogenital implants: Secondary | ICD-10-CM | POA: Diagnosis not present

## 2023-04-27 DIAGNOSIS — Z7902 Long term (current) use of antithrombotics/antiplatelets: Secondary | ICD-10-CM | POA: Diagnosis not present

## 2023-04-27 DIAGNOSIS — E1151 Type 2 diabetes mellitus with diabetic peripheral angiopathy without gangrene: Secondary | ICD-10-CM | POA: Diagnosis not present

## 2023-04-27 DIAGNOSIS — I69322 Dysarthria following cerebral infarction: Secondary | ICD-10-CM | POA: Diagnosis not present

## 2023-04-27 DIAGNOSIS — I129 Hypertensive chronic kidney disease with stage 1 through stage 4 chronic kidney disease, or unspecified chronic kidney disease: Secondary | ICD-10-CM | POA: Diagnosis not present

## 2023-04-27 DIAGNOSIS — I872 Venous insufficiency (chronic) (peripheral): Secondary | ICD-10-CM | POA: Diagnosis not present

## 2023-04-27 DIAGNOSIS — L97811 Non-pressure chronic ulcer of other part of right lower leg limited to breakdown of skin: Secondary | ICD-10-CM | POA: Diagnosis not present

## 2023-04-27 DIAGNOSIS — Z7952 Long term (current) use of systemic steroids: Secondary | ICD-10-CM | POA: Diagnosis not present

## 2023-04-27 DIAGNOSIS — E1122 Type 2 diabetes mellitus with diabetic chronic kidney disease: Secondary | ICD-10-CM | POA: Diagnosis not present

## 2023-04-27 DIAGNOSIS — Z8546 Personal history of malignant neoplasm of prostate: Secondary | ICD-10-CM | POA: Diagnosis not present

## 2023-04-27 DIAGNOSIS — I69351 Hemiplegia and hemiparesis following cerebral infarction affecting right dominant side: Secondary | ICD-10-CM | POA: Diagnosis not present

## 2023-05-01 DIAGNOSIS — E1151 Type 2 diabetes mellitus with diabetic peripheral angiopathy without gangrene: Secondary | ICD-10-CM | POA: Diagnosis not present

## 2023-05-01 DIAGNOSIS — I69322 Dysarthria following cerebral infarction: Secondary | ICD-10-CM | POA: Diagnosis not present

## 2023-05-01 DIAGNOSIS — E1122 Type 2 diabetes mellitus with diabetic chronic kidney disease: Secondary | ICD-10-CM | POA: Diagnosis not present

## 2023-05-01 DIAGNOSIS — Z8546 Personal history of malignant neoplasm of prostate: Secondary | ICD-10-CM | POA: Diagnosis not present

## 2023-05-01 DIAGNOSIS — Z7902 Long term (current) use of antithrombotics/antiplatelets: Secondary | ICD-10-CM | POA: Diagnosis not present

## 2023-05-01 DIAGNOSIS — N182 Chronic kidney disease, stage 2 (mild): Secondary | ICD-10-CM | POA: Diagnosis not present

## 2023-05-01 DIAGNOSIS — I129 Hypertensive chronic kidney disease with stage 1 through stage 4 chronic kidney disease, or unspecified chronic kidney disease: Secondary | ICD-10-CM | POA: Diagnosis not present

## 2023-05-01 DIAGNOSIS — I872 Venous insufficiency (chronic) (peripheral): Secondary | ICD-10-CM | POA: Diagnosis not present

## 2023-05-01 DIAGNOSIS — Z9181 History of falling: Secondary | ICD-10-CM | POA: Diagnosis not present

## 2023-05-01 DIAGNOSIS — L97811 Non-pressure chronic ulcer of other part of right lower leg limited to breakdown of skin: Secondary | ICD-10-CM | POA: Diagnosis not present

## 2023-05-01 DIAGNOSIS — I69351 Hemiplegia and hemiparesis following cerebral infarction affecting right dominant side: Secondary | ICD-10-CM | POA: Diagnosis not present

## 2023-05-01 DIAGNOSIS — Z7984 Long term (current) use of oral hypoglycemic drugs: Secondary | ICD-10-CM | POA: Diagnosis not present

## 2023-05-01 DIAGNOSIS — Z7952 Long term (current) use of systemic steroids: Secondary | ICD-10-CM | POA: Diagnosis not present

## 2023-05-01 DIAGNOSIS — Z960 Presence of urogenital implants: Secondary | ICD-10-CM | POA: Diagnosis not present

## 2023-05-04 DIAGNOSIS — Z960 Presence of urogenital implants: Secondary | ICD-10-CM | POA: Diagnosis not present

## 2023-05-04 DIAGNOSIS — L97811 Non-pressure chronic ulcer of other part of right lower leg limited to breakdown of skin: Secondary | ICD-10-CM | POA: Diagnosis not present

## 2023-05-04 DIAGNOSIS — Z7902 Long term (current) use of antithrombotics/antiplatelets: Secondary | ICD-10-CM | POA: Diagnosis not present

## 2023-05-04 DIAGNOSIS — Z9181 History of falling: Secondary | ICD-10-CM | POA: Diagnosis not present

## 2023-05-04 DIAGNOSIS — Z7952 Long term (current) use of systemic steroids: Secondary | ICD-10-CM | POA: Diagnosis not present

## 2023-05-04 DIAGNOSIS — I69351 Hemiplegia and hemiparesis following cerebral infarction affecting right dominant side: Secondary | ICD-10-CM | POA: Diagnosis not present

## 2023-05-04 DIAGNOSIS — I69322 Dysarthria following cerebral infarction: Secondary | ICD-10-CM | POA: Diagnosis not present

## 2023-05-04 DIAGNOSIS — E1122 Type 2 diabetes mellitus with diabetic chronic kidney disease: Secondary | ICD-10-CM | POA: Diagnosis not present

## 2023-05-04 DIAGNOSIS — Z7984 Long term (current) use of oral hypoglycemic drugs: Secondary | ICD-10-CM | POA: Diagnosis not present

## 2023-05-04 DIAGNOSIS — I872 Venous insufficiency (chronic) (peripheral): Secondary | ICD-10-CM | POA: Diagnosis not present

## 2023-05-04 DIAGNOSIS — Z8546 Personal history of malignant neoplasm of prostate: Secondary | ICD-10-CM | POA: Diagnosis not present

## 2023-05-04 DIAGNOSIS — N182 Chronic kidney disease, stage 2 (mild): Secondary | ICD-10-CM | POA: Diagnosis not present

## 2023-05-04 DIAGNOSIS — I129 Hypertensive chronic kidney disease with stage 1 through stage 4 chronic kidney disease, or unspecified chronic kidney disease: Secondary | ICD-10-CM | POA: Diagnosis not present

## 2023-05-04 DIAGNOSIS — E1151 Type 2 diabetes mellitus with diabetic peripheral angiopathy without gangrene: Secondary | ICD-10-CM | POA: Diagnosis not present

## 2023-05-05 DIAGNOSIS — I69322 Dysarthria following cerebral infarction: Secondary | ICD-10-CM | POA: Diagnosis not present

## 2023-05-05 DIAGNOSIS — Z8546 Personal history of malignant neoplasm of prostate: Secondary | ICD-10-CM | POA: Diagnosis not present

## 2023-05-05 DIAGNOSIS — Z7984 Long term (current) use of oral hypoglycemic drugs: Secondary | ICD-10-CM | POA: Diagnosis not present

## 2023-05-05 DIAGNOSIS — E1151 Type 2 diabetes mellitus with diabetic peripheral angiopathy without gangrene: Secondary | ICD-10-CM | POA: Diagnosis not present

## 2023-05-05 DIAGNOSIS — Z7952 Long term (current) use of systemic steroids: Secondary | ICD-10-CM | POA: Diagnosis not present

## 2023-05-05 DIAGNOSIS — I69351 Hemiplegia and hemiparesis following cerebral infarction affecting right dominant side: Secondary | ICD-10-CM | POA: Diagnosis not present

## 2023-05-05 DIAGNOSIS — Z7902 Long term (current) use of antithrombotics/antiplatelets: Secondary | ICD-10-CM | POA: Diagnosis not present

## 2023-05-05 DIAGNOSIS — L97811 Non-pressure chronic ulcer of other part of right lower leg limited to breakdown of skin: Secondary | ICD-10-CM | POA: Diagnosis not present

## 2023-05-05 DIAGNOSIS — I129 Hypertensive chronic kidney disease with stage 1 through stage 4 chronic kidney disease, or unspecified chronic kidney disease: Secondary | ICD-10-CM | POA: Diagnosis not present

## 2023-05-05 DIAGNOSIS — Z9181 History of falling: Secondary | ICD-10-CM | POA: Diagnosis not present

## 2023-05-05 DIAGNOSIS — I872 Venous insufficiency (chronic) (peripheral): Secondary | ICD-10-CM | POA: Diagnosis not present

## 2023-05-05 DIAGNOSIS — N182 Chronic kidney disease, stage 2 (mild): Secondary | ICD-10-CM | POA: Diagnosis not present

## 2023-05-05 DIAGNOSIS — E1122 Type 2 diabetes mellitus with diabetic chronic kidney disease: Secondary | ICD-10-CM | POA: Diagnosis not present

## 2023-05-05 DIAGNOSIS — Z960 Presence of urogenital implants: Secondary | ICD-10-CM | POA: Diagnosis not present

## 2023-05-06 DIAGNOSIS — C7951 Secondary malignant neoplasm of bone: Secondary | ICD-10-CM | POA: Diagnosis not present

## 2023-05-06 DIAGNOSIS — C61 Malignant neoplasm of prostate: Secondary | ICD-10-CM | POA: Diagnosis not present

## 2023-05-06 DIAGNOSIS — R338 Other retention of urine: Secondary | ICD-10-CM | POA: Diagnosis not present

## 2023-05-06 DIAGNOSIS — C775 Secondary and unspecified malignant neoplasm of intrapelvic lymph nodes: Secondary | ICD-10-CM | POA: Diagnosis not present

## 2023-05-08 ENCOUNTER — Telehealth: Payer: Self-pay

## 2023-05-08 ENCOUNTER — Ambulatory Visit: Payer: Medicare HMO | Admitting: Family Medicine

## 2023-05-08 DIAGNOSIS — I872 Venous insufficiency (chronic) (peripheral): Secondary | ICD-10-CM | POA: Diagnosis not present

## 2023-05-08 DIAGNOSIS — E1151 Type 2 diabetes mellitus with diabetic peripheral angiopathy without gangrene: Secondary | ICD-10-CM | POA: Diagnosis not present

## 2023-05-08 DIAGNOSIS — I69351 Hemiplegia and hemiparesis following cerebral infarction affecting right dominant side: Secondary | ICD-10-CM | POA: Diagnosis not present

## 2023-05-08 DIAGNOSIS — Z7952 Long term (current) use of systemic steroids: Secondary | ICD-10-CM | POA: Diagnosis not present

## 2023-05-08 DIAGNOSIS — Z7902 Long term (current) use of antithrombotics/antiplatelets: Secondary | ICD-10-CM | POA: Diagnosis not present

## 2023-05-08 DIAGNOSIS — Z7984 Long term (current) use of oral hypoglycemic drugs: Secondary | ICD-10-CM | POA: Diagnosis not present

## 2023-05-08 DIAGNOSIS — L97811 Non-pressure chronic ulcer of other part of right lower leg limited to breakdown of skin: Secondary | ICD-10-CM | POA: Diagnosis not present

## 2023-05-08 DIAGNOSIS — N182 Chronic kidney disease, stage 2 (mild): Secondary | ICD-10-CM | POA: Diagnosis not present

## 2023-05-08 DIAGNOSIS — Z8546 Personal history of malignant neoplasm of prostate: Secondary | ICD-10-CM | POA: Diagnosis not present

## 2023-05-08 DIAGNOSIS — E1122 Type 2 diabetes mellitus with diabetic chronic kidney disease: Secondary | ICD-10-CM | POA: Diagnosis not present

## 2023-05-08 DIAGNOSIS — Z960 Presence of urogenital implants: Secondary | ICD-10-CM | POA: Diagnosis not present

## 2023-05-08 DIAGNOSIS — Z9181 History of falling: Secondary | ICD-10-CM | POA: Diagnosis not present

## 2023-05-08 DIAGNOSIS — I129 Hypertensive chronic kidney disease with stage 1 through stage 4 chronic kidney disease, or unspecified chronic kidney disease: Secondary | ICD-10-CM | POA: Diagnosis not present

## 2023-05-08 DIAGNOSIS — I69322 Dysarthria following cerebral infarction: Secondary | ICD-10-CM | POA: Diagnosis not present

## 2023-05-08 NOTE — Telephone Encounter (Signed)
Form handed to me by clinic staff. Cover sheet completed.   Form placed in provider box for completion.   Please place in RN box once completed.   Veronda Prude, RN

## 2023-05-08 NOTE — Telephone Encounter (Signed)
[name] [facility] OT calls nurse line to report abnormal blood pressure.   She reports this morning around 11am she took his BP and it was 168/80. She reports he had not taken his BP medication yet. She reports she had him take this ~1130am. She reports she could not stay to repeat his blood pressure.   She reported he stated he had an apt with PCP this afternoon.   He was in no acute distress.   Advised will forward to PCP.

## 2023-05-09 DIAGNOSIS — I69322 Dysarthria following cerebral infarction: Secondary | ICD-10-CM | POA: Diagnosis not present

## 2023-05-09 DIAGNOSIS — I129 Hypertensive chronic kidney disease with stage 1 through stage 4 chronic kidney disease, or unspecified chronic kidney disease: Secondary | ICD-10-CM | POA: Diagnosis not present

## 2023-05-09 DIAGNOSIS — Z7984 Long term (current) use of oral hypoglycemic drugs: Secondary | ICD-10-CM | POA: Diagnosis not present

## 2023-05-09 DIAGNOSIS — I872 Venous insufficiency (chronic) (peripheral): Secondary | ICD-10-CM | POA: Diagnosis not present

## 2023-05-09 DIAGNOSIS — Z7952 Long term (current) use of systemic steroids: Secondary | ICD-10-CM | POA: Diagnosis not present

## 2023-05-09 DIAGNOSIS — E1122 Type 2 diabetes mellitus with diabetic chronic kidney disease: Secondary | ICD-10-CM | POA: Diagnosis not present

## 2023-05-09 DIAGNOSIS — Z7902 Long term (current) use of antithrombotics/antiplatelets: Secondary | ICD-10-CM | POA: Diagnosis not present

## 2023-05-09 DIAGNOSIS — N182 Chronic kidney disease, stage 2 (mild): Secondary | ICD-10-CM | POA: Diagnosis not present

## 2023-05-09 DIAGNOSIS — I69351 Hemiplegia and hemiparesis following cerebral infarction affecting right dominant side: Secondary | ICD-10-CM | POA: Diagnosis not present

## 2023-05-09 DIAGNOSIS — E1151 Type 2 diabetes mellitus with diabetic peripheral angiopathy without gangrene: Secondary | ICD-10-CM | POA: Diagnosis not present

## 2023-05-09 DIAGNOSIS — L97811 Non-pressure chronic ulcer of other part of right lower leg limited to breakdown of skin: Secondary | ICD-10-CM | POA: Diagnosis not present

## 2023-05-09 DIAGNOSIS — Z9181 History of falling: Secondary | ICD-10-CM | POA: Diagnosis not present

## 2023-05-09 DIAGNOSIS — Z960 Presence of urogenital implants: Secondary | ICD-10-CM | POA: Diagnosis not present

## 2023-05-09 DIAGNOSIS — Z8546 Personal history of malignant neoplasm of prostate: Secondary | ICD-10-CM | POA: Diagnosis not present

## 2023-05-13 ENCOUNTER — Other Ambulatory Visit: Payer: Self-pay | Admitting: Family Medicine

## 2023-05-13 DIAGNOSIS — N182 Chronic kidney disease, stage 2 (mild): Secondary | ICD-10-CM | POA: Diagnosis not present

## 2023-05-13 DIAGNOSIS — Z8546 Personal history of malignant neoplasm of prostate: Secondary | ICD-10-CM | POA: Diagnosis not present

## 2023-05-13 DIAGNOSIS — I69351 Hemiplegia and hemiparesis following cerebral infarction affecting right dominant side: Secondary | ICD-10-CM | POA: Diagnosis not present

## 2023-05-13 DIAGNOSIS — Z9181 History of falling: Secondary | ICD-10-CM | POA: Diagnosis not present

## 2023-05-13 DIAGNOSIS — I872 Venous insufficiency (chronic) (peripheral): Secondary | ICD-10-CM | POA: Diagnosis not present

## 2023-05-13 DIAGNOSIS — Z7952 Long term (current) use of systemic steroids: Secondary | ICD-10-CM | POA: Diagnosis not present

## 2023-05-13 DIAGNOSIS — L97811 Non-pressure chronic ulcer of other part of right lower leg limited to breakdown of skin: Secondary | ICD-10-CM | POA: Diagnosis not present

## 2023-05-13 DIAGNOSIS — Z7984 Long term (current) use of oral hypoglycemic drugs: Secondary | ICD-10-CM | POA: Diagnosis not present

## 2023-05-13 DIAGNOSIS — I69322 Dysarthria following cerebral infarction: Secondary | ICD-10-CM | POA: Diagnosis not present

## 2023-05-13 DIAGNOSIS — I129 Hypertensive chronic kidney disease with stage 1 through stage 4 chronic kidney disease, or unspecified chronic kidney disease: Secondary | ICD-10-CM | POA: Diagnosis not present

## 2023-05-13 DIAGNOSIS — E1151 Type 2 diabetes mellitus with diabetic peripheral angiopathy without gangrene: Secondary | ICD-10-CM | POA: Diagnosis not present

## 2023-05-13 DIAGNOSIS — Z960 Presence of urogenital implants: Secondary | ICD-10-CM | POA: Diagnosis not present

## 2023-05-13 DIAGNOSIS — Z7902 Long term (current) use of antithrombotics/antiplatelets: Secondary | ICD-10-CM | POA: Diagnosis not present

## 2023-05-13 DIAGNOSIS — E1122 Type 2 diabetes mellitus with diabetic chronic kidney disease: Secondary | ICD-10-CM | POA: Diagnosis not present

## 2023-05-13 NOTE — Telephone Encounter (Signed)
Called patient's daughter to schedule follow up appointment as patient has missed several prior appointments. Daughter expressed frustration with having to miss work and take vacation days to bring the patient into the clinic. Daughter states she is applying for transportation assistance for the patient through [employer] and dropped off paperwork at the clinic. Discussed the importance of follow up visits to manage patient's various medical needs, daughter continued to express frustration at having to use her time and resources to assist the patient with this. Scheduled follow up visit for patient in January. Will look into [employer] paperwork and investigate other resources that may improve patient's ability to attend scheduled appointments.

## 2023-05-14 ENCOUNTER — Telehealth: Payer: Self-pay

## 2023-05-14 DIAGNOSIS — I129 Hypertensive chronic kidney disease with stage 1 through stage 4 chronic kidney disease, or unspecified chronic kidney disease: Secondary | ICD-10-CM | POA: Diagnosis not present

## 2023-05-14 DIAGNOSIS — Z960 Presence of urogenital implants: Secondary | ICD-10-CM | POA: Diagnosis not present

## 2023-05-14 DIAGNOSIS — E1122 Type 2 diabetes mellitus with diabetic chronic kidney disease: Secondary | ICD-10-CM | POA: Diagnosis not present

## 2023-05-14 DIAGNOSIS — E1151 Type 2 diabetes mellitus with diabetic peripheral angiopathy without gangrene: Secondary | ICD-10-CM | POA: Diagnosis not present

## 2023-05-14 DIAGNOSIS — N182 Chronic kidney disease, stage 2 (mild): Secondary | ICD-10-CM | POA: Diagnosis not present

## 2023-05-14 DIAGNOSIS — Z7952 Long term (current) use of systemic steroids: Secondary | ICD-10-CM | POA: Diagnosis not present

## 2023-05-14 DIAGNOSIS — I69322 Dysarthria following cerebral infarction: Secondary | ICD-10-CM | POA: Diagnosis not present

## 2023-05-14 DIAGNOSIS — L97811 Non-pressure chronic ulcer of other part of right lower leg limited to breakdown of skin: Secondary | ICD-10-CM | POA: Diagnosis not present

## 2023-05-14 DIAGNOSIS — I872 Venous insufficiency (chronic) (peripheral): Secondary | ICD-10-CM | POA: Diagnosis not present

## 2023-05-14 DIAGNOSIS — Z7902 Long term (current) use of antithrombotics/antiplatelets: Secondary | ICD-10-CM | POA: Diagnosis not present

## 2023-05-14 DIAGNOSIS — Z9181 History of falling: Secondary | ICD-10-CM | POA: Diagnosis not present

## 2023-05-14 DIAGNOSIS — Z7984 Long term (current) use of oral hypoglycemic drugs: Secondary | ICD-10-CM | POA: Diagnosis not present

## 2023-05-14 DIAGNOSIS — Z8546 Personal history of malignant neoplasm of prostate: Secondary | ICD-10-CM | POA: Diagnosis not present

## 2023-05-14 DIAGNOSIS — I69351 Hemiplegia and hemiparesis following cerebral infarction affecting right dominant side: Secondary | ICD-10-CM | POA: Diagnosis not present
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
## 2023-05-15 ENCOUNTER — Telehealth: Payer: Self-pay | Admitting: Family Medicine
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
## 2023-05-15 NOTE — Telephone Encounter (Signed)
Forms placed up front for pick up for [facility].   Copy made for batch scanning.   Daughter is aware and she will collect them next week.

## 2023-05-18 DIAGNOSIS — E1122 Type 2 diabetes mellitus with diabetic chronic kidney disease: Secondary | ICD-10-CM | POA: Diagnosis not present

## 2023-05-18 DIAGNOSIS — I69351 Hemiplegia and hemiparesis following cerebral infarction affecting right dominant side: Secondary | ICD-10-CM | POA: Diagnosis not present

## 2023-05-18 DIAGNOSIS — L97811 Non-pressure chronic ulcer of other part of right lower leg limited to breakdown of skin: Secondary | ICD-10-CM | POA: Diagnosis not present

## 2023-05-18 DIAGNOSIS — I872 Venous insufficiency (chronic) (peripheral): Secondary | ICD-10-CM | POA: Diagnosis not present

## 2023-05-18 DIAGNOSIS — I69322 Dysarthria following cerebral infarction: Secondary | ICD-10-CM | POA: Diagnosis not present

## 2023-05-18 DIAGNOSIS — Z9181 History of falling: Secondary | ICD-10-CM | POA: Diagnosis not present

## 2023-05-18 DIAGNOSIS — Z8546 Personal history of malignant neoplasm of prostate: Secondary | ICD-10-CM | POA: Diagnosis not present

## 2023-05-18 DIAGNOSIS — N182 Chronic kidney disease, stage 2 (mild): Secondary | ICD-10-CM | POA: Diagnosis not present

## 2023-05-18 DIAGNOSIS — Z7902 Long term (current) use of antithrombotics/antiplatelets: Secondary | ICD-10-CM | POA: Diagnosis not present

## 2023-05-18 DIAGNOSIS — Z7984 Long term (current) use of oral hypoglycemic drugs: Secondary | ICD-10-CM | POA: Diagnosis not present

## 2023-05-18 DIAGNOSIS — I129 Hypertensive chronic kidney disease with stage 1 through stage 4 chronic kidney disease, or unspecified chronic kidney disease: Secondary | ICD-10-CM | POA: Diagnosis not present

## 2023-05-18 DIAGNOSIS — Z960 Presence of urogenital implants: Secondary | ICD-10-CM | POA: Diagnosis not present

## 2023-05-18 DIAGNOSIS — Z7952 Long term (current) use of systemic steroids: Secondary | ICD-10-CM | POA: Diagnosis not present

## 2023-05-18 DIAGNOSIS — E1151 Type 2 diabetes mellitus with diabetic peripheral angiopathy without gangrene: Secondary | ICD-10-CM | POA: Diagnosis not present

## 2023-05-21 DIAGNOSIS — E1122 Type 2 diabetes mellitus with diabetic chronic kidney disease: Secondary | ICD-10-CM | POA: Diagnosis not present

## 2023-05-21 DIAGNOSIS — Z960 Presence of urogenital implants: Secondary | ICD-10-CM | POA: Diagnosis not present

## 2023-05-21 DIAGNOSIS — E1151 Type 2 diabetes mellitus with diabetic peripheral angiopathy without gangrene: Secondary | ICD-10-CM | POA: Diagnosis not present

## 2023-05-21 DIAGNOSIS — I69351 Hemiplegia and hemiparesis following cerebral infarction affecting right dominant side: Secondary | ICD-10-CM | POA: Diagnosis not present

## 2023-05-21 DIAGNOSIS — I69322 Dysarthria following cerebral infarction: Secondary | ICD-10-CM | POA: Diagnosis not present

## 2023-05-21 DIAGNOSIS — Z7902 Long term (current) use of antithrombotics/antiplatelets: Secondary | ICD-10-CM | POA: Diagnosis not present

## 2023-05-21 DIAGNOSIS — N182 Chronic kidney disease, stage 2 (mild): Secondary | ICD-10-CM | POA: Diagnosis not present

## 2023-05-21 DIAGNOSIS — Z7952 Long term (current) use of systemic steroids: Secondary | ICD-10-CM | POA: Diagnosis not present

## 2023-05-21 DIAGNOSIS — Z8546 Personal history of malignant neoplasm of prostate: Secondary | ICD-10-CM | POA: Diagnosis not present

## 2023-05-21 DIAGNOSIS — Z7984 Long term (current) use of oral hypoglycemic drugs: Secondary | ICD-10-CM | POA: Diagnosis not present

## 2023-05-21 DIAGNOSIS — I129 Hypertensive chronic kidney disease with stage 1 through stage 4 chronic kidney disease, or unspecified chronic kidney disease: Secondary | ICD-10-CM | POA: Diagnosis not present

## 2023-05-21 DIAGNOSIS — Z9181 History of falling: Secondary | ICD-10-CM | POA: Diagnosis not present

## 2023-05-21 DIAGNOSIS — I872 Venous insufficiency (chronic) (peripheral): Secondary | ICD-10-CM | POA: Diagnosis not present

## 2023-05-21 DIAGNOSIS — L97811 Non-pressure chronic ulcer of other part of right lower leg limited to breakdown of skin: Secondary | ICD-10-CM | POA: Diagnosis not present

## 2023-05-22 DIAGNOSIS — I69322 Dysarthria following cerebral infarction: Secondary | ICD-10-CM | POA: Diagnosis not present

## 2023-05-22 DIAGNOSIS — Z960 Presence of urogenital implants: Secondary | ICD-10-CM | POA: Diagnosis not present

## 2023-05-22 DIAGNOSIS — Z8546 Personal history of malignant neoplasm of prostate: Secondary | ICD-10-CM | POA: Diagnosis not present

## 2023-05-22 DIAGNOSIS — Z7902 Long term (current) use of antithrombotics/antiplatelets: Secondary | ICD-10-CM | POA: Diagnosis not present

## 2023-05-22 DIAGNOSIS — I872 Venous insufficiency (chronic) (peripheral): Secondary | ICD-10-CM | POA: Diagnosis not present

## 2023-05-22 DIAGNOSIS — Z7952 Long term (current) use of systemic steroids: Secondary | ICD-10-CM | POA: Diagnosis not present

## 2023-05-22 DIAGNOSIS — Z9181 History of falling: Secondary | ICD-10-CM | POA: Diagnosis not present

## 2023-05-22 DIAGNOSIS — E1151 Type 2 diabetes mellitus with diabetic peripheral angiopathy without gangrene: Secondary | ICD-10-CM | POA: Diagnosis not present

## 2023-05-22 DIAGNOSIS — E1122 Type 2 diabetes mellitus with diabetic chronic kidney disease: Secondary | ICD-10-CM | POA: Diagnosis not present

## 2023-05-22 DIAGNOSIS — N182 Chronic kidney disease, stage 2 (mild): Secondary | ICD-10-CM | POA: Diagnosis not present

## 2023-05-22 DIAGNOSIS — I129 Hypertensive chronic kidney disease with stage 1 through stage 4 chronic kidney disease, or unspecified chronic kidney disease: Secondary | ICD-10-CM | POA: Diagnosis not present

## 2023-05-22 DIAGNOSIS — Z7984 Long term (current) use of oral hypoglycemic drugs: Secondary | ICD-10-CM | POA: Diagnosis not present

## 2023-05-22 DIAGNOSIS — L97811 Non-pressure chronic ulcer of other part of right lower leg limited to breakdown of skin: Secondary | ICD-10-CM | POA: Diagnosis not present

## 2023-05-22 DIAGNOSIS — I69351 Hemiplegia and hemiparesis following cerebral infarction affecting right dominant side: Secondary | ICD-10-CM | POA: Diagnosis not present

## 2023-05-28 ENCOUNTER — Encounter (HOSPITAL_COMMUNITY): Payer: Self-pay

## 2023-05-28 ENCOUNTER — Emergency Department (HOSPITAL_COMMUNITY): Payer: Medicare HMO

## 2023-05-28 ENCOUNTER — Other Ambulatory Visit: Payer: Self-pay

## 2023-05-28 ENCOUNTER — Emergency Department (HOSPITAL_COMMUNITY)
Admission: EM | Admit: 2023-05-28 | Discharge: 2023-06-01 | Disposition: A | Discharge status: Home / Self Care | Payer: Medicare HMO | Attending: Emergency Medicine | Admitting: Emergency Medicine

## 2023-05-28 DIAGNOSIS — N182 Chronic kidney disease, stage 2 (mild): Secondary | ICD-10-CM | POA: Diagnosis not present

## 2023-05-28 DIAGNOSIS — Y9301 Activity, walking, marching and hiking: Secondary | ICD-10-CM | POA: Diagnosis not present

## 2023-05-28 DIAGNOSIS — Z743 Need for continuous supervision: Secondary | ICD-10-CM | POA: Diagnosis not present

## 2023-05-28 DIAGNOSIS — Z7984 Long term (current) use of oral hypoglycemic drugs: Secondary | ICD-10-CM | POA: Insufficient documentation

## 2023-05-28 DIAGNOSIS — R531 Weakness: Secondary | ICD-10-CM | POA: Diagnosis not present

## 2023-05-28 DIAGNOSIS — M25562 Pain in left knee: Secondary | ICD-10-CM | POA: Insufficient documentation

## 2023-05-28 DIAGNOSIS — M85861 Other specified disorders of bone density and structure, right lower leg: Secondary | ICD-10-CM | POA: Diagnosis not present

## 2023-05-28 DIAGNOSIS — W19XXXA Unspecified fall, initial encounter: Secondary | ICD-10-CM | POA: Diagnosis not present

## 2023-05-28 DIAGNOSIS — Z8546 Personal history of malignant neoplasm of prostate: Secondary | ICD-10-CM | POA: Insufficient documentation

## 2023-05-28 DIAGNOSIS — Z794 Long term (current) use of insulin: Secondary | ICD-10-CM | POA: Diagnosis not present

## 2023-05-28 DIAGNOSIS — M25561 Pain in right knee: Secondary | ICD-10-CM

## 2023-05-28 DIAGNOSIS — R609 Edema, unspecified: Secondary | ICD-10-CM | POA: Diagnosis not present

## 2023-05-28 DIAGNOSIS — I10 Essential (primary) hypertension: Secondary | ICD-10-CM | POA: Diagnosis not present

## 2023-05-28 DIAGNOSIS — Z79899 Other long term (current) drug therapy: Secondary | ICD-10-CM | POA: Diagnosis not present

## 2023-05-28 DIAGNOSIS — M1711 Unilateral primary osteoarthritis, right knee: Secondary | ICD-10-CM | POA: Diagnosis not present

## 2023-05-28 DIAGNOSIS — E119 Type 2 diabetes mellitus without complications: Secondary | ICD-10-CM | POA: Insufficient documentation

## 2023-05-28 DIAGNOSIS — I129 Hypertensive chronic kidney disease with stage 1 through stage 4 chronic kidney disease, or unspecified chronic kidney disease: Secondary | ICD-10-CM | POA: Diagnosis not present

## 2023-05-28 DIAGNOSIS — R739 Hyperglycemia, unspecified: Secondary | ICD-10-CM | POA: Diagnosis not present

## 2023-05-28 LAB — BASIC METABOLIC PANEL
Anion gap: 11 (ref 5–15)
BUN: 20 mg/dL (ref 8–23)
CO2: 22 mmol/L (ref 22–32)
Calcium: 8.7 mg/dL — ABNORMAL LOW (ref 8.9–10.3)
Chloride: 100 mmol/L (ref 98–111)
Creatinine, Ser: 1.08 mg/dL (ref 0.61–1.24)
GFR, Estimated: >60 mL/min (ref >60)
Glucose, Bld: 347 mg/dL — ABNORMAL HIGH (ref 70–99)
Potassium: 3.4 mmol/L — ABNORMAL LOW (ref 3.5–5.1)
Sodium: 133 mmol/L — ABNORMAL LOW (ref 135–145)

## 2023-05-28 LAB — CBC
HCT: 33.1 % — ABNORMAL LOW (ref 39.0–52.0)
Hemoglobin: 10.8 g/dL — ABNORMAL LOW (ref 13.0–17.0)
MCH: 28.1 pg (ref 26.0–34.0)
MCHC: 32.6 g/dL (ref 30.0–36.0)
MCV: 86.2 fL (ref 80.0–100.0)
Platelets: 135 10*3/uL — ABNORMAL LOW (ref 150–400)
RBC: 3.84 MIL/uL — ABNORMAL LOW (ref 4.22–5.81)
RDW: 12.5 % (ref 11.5–15.5)
WBC: 9.5 10*3/uL (ref 4.0–10.5)
nRBC: 0 % (ref 0.0–0.2)

## 2023-05-28 MED ORDER — ABIRATERONE ACETATE 250 MG PO TABS: 1000.0000 mg | ORAL_TABLET | Freq: Every day | ORAL | Status: DC

## 2023-05-28 MED ORDER — CLOPIDOGREL BISULFATE 75 MG PO TABS
75.0000 mg | ORAL_TABLET | Freq: Every day | ORAL | Status: DC
  Administered 2023-05-29 – 2023-06-01 (×4): 75 mg via ORAL
  Filled 2023-05-28 (×4): qty 1

## 2023-05-28 MED ORDER — PREDNISONE 5 MG PO TABS
5.0000 mg | ORAL_TABLET | Freq: Every day | ORAL | Status: DC
  Administered 2023-05-29 – 2023-06-01 (×4): 5 mg via ORAL
  Filled 2023-05-28 (×4): qty 1

## 2023-05-28 MED ORDER — IRBESARTAN 300 MG PO TABS
150.0000 mg | ORAL_TABLET | Freq: Every day | ORAL | Status: DC
  Administered 2023-05-29 – 2023-06-01 (×4): 150 mg via ORAL
  Filled 2023-05-28 (×4): qty 1

## 2023-05-28 MED ORDER — ACETAMINOPHEN 325 MG PO TABS
975.0000 mg | ORAL_TABLET | Freq: Once | ORAL | Status: AC
  Administered 2023-05-28: 975 mg via ORAL
  Filled 2023-05-28: qty 3

## 2023-05-28 MED ORDER — EMPAGLIFLOZIN 10 MG PO TABS
10.0000 mg | ORAL_TABLET | Freq: Every day | ORAL | Status: DC
  Administered 2023-05-29 – 2023-06-01 (×4): 10 mg via ORAL
  Filled 2023-05-28 (×4): qty 1

## 2023-05-28 MED ORDER — AMLODIPINE-OLMESARTAN 10-20 MG PO TABS: 1.0000 | ORAL_TABLET | Freq: Every day | ORAL | Status: DC

## 2023-05-28 MED ORDER — CARVEDILOL 12.5 MG PO TABS
12.5000 mg | ORAL_TABLET | Freq: Two times a day (BID) | ORAL | Status: DC
  Administered 2023-05-29 – 2023-06-01 (×7): 12.5 mg via ORAL
  Filled 2023-05-28 (×7): qty 1

## 2023-05-28 MED ORDER — PANTOPRAZOLE SODIUM 20 MG PO TBEC
20.0000 mg | DELAYED_RELEASE_TABLET | Freq: Every day | ORAL | Status: DC
  Administered 2023-05-29 – 2023-06-01 (×4): 20 mg via ORAL
  Filled 2023-05-28 (×4): qty 1

## 2023-05-28 MED ORDER — CELECOXIB 200 MG PO CAPS: 200.0000 mg | ORAL_CAPSULE | Freq: Every day | ORAL | 0 refills | Status: AC

## 2023-05-28 MED ORDER — AMLODIPINE BESYLATE 5 MG PO TABS
10.0000 mg | ORAL_TABLET | Freq: Every day | ORAL | Status: DC
  Administered 2023-05-29 – 2023-06-01 (×4): 10 mg via ORAL
  Filled 2023-05-28 (×4): qty 2

## 2023-05-28 MED ORDER — INSULIN GLARGINE-YFGN 100 UNIT/ML ~~LOC~~ SOLN
10.0000 [IU] | Freq: Every day | SUBCUTANEOUS | Status: DC
  Administered 2023-05-29 – 2023-06-01 (×4): 10 [IU] via SUBCUTANEOUS
  Filled 2023-05-28 (×4): qty 0.1

## 2023-05-28 MED ORDER — SERTRALINE HCL 100 MG PO TABS
100.0000 mg | ORAL_TABLET | Freq: Every day | ORAL | Status: DC
  Administered 2023-05-29 – 2023-06-01 (×4): 100 mg via ORAL
  Filled 2023-05-28 (×4): qty 1

## 2023-05-28 MED ORDER — METFORMIN HCL 500 MG PO TABS
1000.0000 mg | ORAL_TABLET | Freq: Two times a day (BID) | ORAL | Status: DC
  Administered 2023-05-29 – 2023-06-01 (×7): 1000 mg via ORAL
  Filled 2023-05-28 (×7): qty 2

## 2023-05-28 MED ORDER — ATORVASTATIN CALCIUM 40 MG PO TABS
40.0000 mg | ORAL_TABLET | Freq: Every day | ORAL | Status: DC
  Administered 2023-05-29 – 2023-06-01 (×4): 40 mg via ORAL
  Filled 2023-05-28 (×4): qty 1

## 2023-05-28 NOTE — ED Notes (Signed)
Called pt's emergency contact to arrange for transport home.  She states that she is his ex-wife and that he lives alone.  She states that he was unable to walk on his own and that the ambulance had to physically put him on the stretcher.  Pt arrives soiled with dried stool dirty clothes.  Family is concerned that pt is unable to care for himself at home.  Same discussed the EDP.

## 2023-05-28 NOTE — ED Provider Notes (Signed)
Pt states he does not want to be in a nursing home but he is having a hard time walking after twisting his knee.  Pt is willing to see what the physical therapist recommends.  I am concerned that with patient stroke and gait instability he would benefit at least with some short term rehab.    Linwood Dibbles, MD 05/28/23 2213

## 2023-05-28 NOTE — Progress Notes (Signed)
Orthopedic Tech Progress Note Patient Details:  [name] May 05, 1951 [id number]  RLE knee sleeve applied to patient  Ortho Devices Type of Ortho Device: Knee Sleeve Ortho Device/Splint Location: RLE Ortho Device/Splint Interventions: Ordered, Application, Adjustment   Post Interventions Patient Tolerated: Well Instructions Provided: Adjustment of device, Care of device  Diannia Ruder 05/28/2023, 11:39 PM

## 2023-05-28 NOTE — ED Notes (Signed)
1500 mL drained from existing Foley bag on arrival to EMS triage.

## 2023-05-28 NOTE — Discharge Instructions (Addendum)
Thank you so much for letting us evaluate you today! Your knee imaging was negative for fracture but there is some fluid around joint. This may be due to ligamentous injury.  I have provided you with orthopedic follow-up for better imaging and further management of this please make sure to make an appointment as soon as you can.  We have given you a knee immobilizer in ED and sent celebrex to your pharmacy on [address]  Please return to emergency department if you experience significant worsening of symptoms

## 2023-05-28 NOTE — ED Triage Notes (Signed)
Pt fell last night, tweaked his knee from the fall, no head injury, no on blood thinners, then he had another fall and the pain in the right knee caused him to fall he already has right sided weakness compounded from his stroke in 2008 with the new right knee pain.  Medic vitals   150/70 62hr 18rr 98% [id number]

## 2023-05-28 NOTE — Progress Notes (Signed)
PT consult pending 

## 2023-05-28 NOTE — ED Provider Notes (Signed)
[place] EMERGENCY DEPARTMENT AT [facility] Provider Note   CSN: [id number] Arrival date & time: 05/28/23  1500     History  Chief Complaint  Patient presents with   [name] is a 72 y.o. male with past medical history of HLD, HTN, CKD 2, prostate cancer, CVA (2008), PAD, DVT, insulin-dependent T2DM presents to the emergency department via EMS for evaluation of right knee pain following fall last evening.  He reports that he was walking with his walker and attempted to get into bed when his foot got caught causing him to twist his right knee and fall backwards onto his back.  He immediately called EMS to help him back into bed and did not feel pain then.  However, he woke up this morning with significant pain to right knee with weightbearing.  He denies head injury, LOC, HA, paresthesia, thinners.  Of note, he has right sided weakness and slow speech from previous stroke in 2008  Fall Pertinent negatives include no chest pain, no abdominal pain, no headaches and no shortness of breath.     Home Medications Prior to Admission medications   Medication Sig Start Date End Date Taking? Authorizing Provider  abiraterone acetate (ZYTIGA) 250 MG tablet Take 1,000 mg by mouth daily. 09/12/22  Yes [provider]  amlodipine-olmesartan (AZOR) 10-20 MG tablet TAKE 1 TABLET BY MOUTH EVERY DAY 04/06/23  Yes Hindel, Leah, MD  atorvastatin (LIPITOR) 40 MG tablet Take 1 tablet (40 mg total) by mouth daily. 08/04/22  Yes Ganta, Anupa, DO  Blood Pressure Monitor DEVI 1 Units by Does not apply route daily. Check  blood pressure once daily, record readings, bring to doctor's office. 11/27/20  Yes Peggyann Shoals C, DO  carvedilol (COREG) 12.5 MG tablet Take 1 tablet (12.5 mg total) by mouth 2 (two) times daily with a meal. 03/18/23 06/16/23 Yes Hindel, Leah, MD  celecoxib (CELEBREX) 200 MG capsule Take 1 capsule (200 mg total) by mouth daily. 05/28/23 06/27/23 Yes Judithann Sheen, PA  clopidogrel (PLAVIX) 75 MG tablet Take 1 tablet (75 mg total) by mouth daily. 02/05/23  Yes Hindel, Leah, MD  Continuous Glucose Sensor (DEXCOM G7 SENSOR) MISC Place new sensor every 10 days 01/02/23  Yes McDiarmid, Leighton Roach, MD  empagliflozin (JARDIANCE) 10 MG TABS tablet Take 1 tablet (10 mg total) by mouth daily. 02/17/23 06/17/23 Yes Hindel, Leah, MD  insulin glargine (LANTUS SOLOSTAR) 100 UNIT/ML Solostar Pen Inject 12 Units into the skin daily. 02/19/23  Yes Hindel, Leah, MD  Insulin Pen Needle (PEN NEEDLES) 32G X 4 MM MISC Use to inject insulin daily 10/17/22  Yes Ganta, Anupa, DO  metFORMIN (GLUCOPHAGE) 1000 MG tablet Take 1 tablet (1,000 mg total) by mouth 2 (two) times daily with a meal. 02/17/23  Yes Hindel, Leah, MD  pantoprazole (PROTONIX) 20 MG tablet TAKE 1 TABLET BY MOUTH EVERY DAY 04/27/23  Yes Hindel, Leah, MD  predniSONE (DELTASONE) 5 MG tablet Take 5 mg by mouth daily. 09/12/22  Yes [provider]  sertraline (ZOLOFT) 100 MG tablet Take 1 tablet (100 mg total) by mouth daily. 01/02/23  Yes Hindel, Leah, MD      Allergies    Pork allergy and Shrimp flavor agent (non-screening)    Review of Systems   Review of Systems  Constitutional:  Negative for chills, fatigue and fever.  Respiratory:  Negative for cough, chest tightness, shortness of breath and wheezing.   Cardiovascular:  Negative for  chest pain and palpitations.  Gastrointestinal:  Negative for abdominal pain, constipation, diarrhea, nausea and vomiting.  Musculoskeletal:  Positive for arthralgias. Negative for neck pain.  Neurological:  Negative for dizziness, seizures, weakness, light-headedness, numbness and headaches.    Physical Exam Updated Vital Signs BP (!) 150/79   Pulse (!) 58   Temp 98.9 F (37.2 C)   Resp 18   Ht 5\' 7"  (1.702 m)   Wt 93.4 kg   SpO2 100%   BMI 32.26 kg/m  Physical Exam Vitals and nursing note reviewed.  Constitutional:      General: He is not in acute distress.     Appearance: Normal appearance.  HENT:     Head: Normocephalic and atraumatic.  Eyes:     General: No scleral icterus.       Right eye: No discharge.        Left eye: No discharge.     Extraocular Movements: Extraocular movements intact.     Conjunctiva/sclera: Conjunctivae normal.     Pupils: Pupils are equal, round, and reactive to light.  Cardiovascular:     Rate and Rhythm: Normal rate.     Pulses:          Radial pulses are 2+ on the right side and 2+ on the left side.       Dorsalis pedis pulses are 2+ on the right side and 2+ on the left side.  Pulmonary:     Effort: Pulmonary effort is normal. No respiratory distress.     Breath sounds: Normal breath sounds.  Chest:     Chest wall: No tenderness.  Abdominal:     General: Bowel sounds are normal. There is no distension.     Palpations: Abdomen is soft.     Tenderness: There is no abdominal tenderness. There is no guarding or rebound.  Musculoskeletal:        General: No swelling or deformity.     Cervical back: Normal range of motion and neck supple. No signs of trauma, rigidity, tenderness or bony tenderness.     Thoracic back: No signs of trauma, tenderness or bony tenderness. Normal range of motion.     Lumbar back: No signs of trauma, tenderness or bony tenderness. Normal range of motion.     Comments: Generalized TTP of right knee without gross swelling or overlying skin changes nor infectious appearing.  Knee is able to fully flex and extend without difficulty or pain.  However, he cannot do this on his own at baseline due to right-sided hemiparesis from stroke.  Skin:    General: Skin is warm.     Capillary Refill: Capillary refill takes less than 2 seconds.     Coloration: Skin is not jaundiced or pale.     Findings: No bruising.  Neurological:     Mental Status: He is alert and oriented to person, place, and time. Mental status is at baseline.     Cranial Nerves: No cranial nerve deficit.     Motor: Weakness (RUE  and RLE at baseline) present.     Gait: Gait abnormal.     Comments: Slow speech at baseline    ED Results / Procedures / Treatments   Labs (all labs ordered are listed, but only abnormal results are displayed) Labs Reviewed  CBC - Abnormal; Notable for the following components:      Result Value   RBC 3.84 (*)    Hemoglobin 10.8 (*)    HCT 33.1 (*)  Platelets 135 (*)    All other components within normal limits  BASIC METABOLIC PANEL - Abnormal; Notable for the following components:   Sodium 133 (*)    Potassium 3.4 (*)    Glucose, Bld 347 (*)    Calcium 8.7 (*)    All other components within normal limits  POC OCCULT BLOOD, ED    EKG EKG Interpretation Date/Time:  Thursday May 28 2023 18:06:01 EST Ventricular Rate:  56 PR Interval:  176 QRS Duration:  77 QT Interval:  460 QTC Calculation: 444 R Axis:   34  Text Interpretation: Sinus rhythm Atrial premature complexes Probable left atrial enlargement Borderline T abnormalities, anterior leads No significant change since last tracing Confirmed by Linwood Dibbles [phone]) on 05/28/2023 7:21:24 PM  Radiology DG Knee Complete 4 Views Right Result Date: 05/28/2023 CLINICAL DATA:  Right knee pain. EXAM: RIGHT KNEE - COMPLETE 4+ VIEW COMPARISON:  None Available. FINDINGS: There is no acute fracture or dislocation. The bones are osteopenic. There is mild arthritic changes of the knee. No joint effusion. There is diffuse subcutaneous edema. The soft tissues are unremarkable. IMPRESSION: 1. No acute fracture or dislocation. 2. Mild arthritic changes. Electronically Signed   By: Elgie Collard M.D.   On: 05/28/2023 17:03    Procedures Procedures    Medications Ordered in ED Medications  abiraterone acetate (ZYTIGA) tablet 1,000 mg (has no administration in time range)  atorvastatin (LIPITOR) tablet 40 mg (has no administration in time range)  carvedilol (COREG) tablet 12.5 mg (has no administration in time range)   clopidogrel (PLAVIX) tablet 75 mg (has no administration in time range)  empagliflozin (JARDIANCE) tablet 10 mg (has no administration in time range)  insulin glargine-yfgn (SEMGLEE) injection 10 Units (has no administration in time range)  metFORMIN (GLUCOPHAGE) tablet 1,000 mg (has no administration in time range)  pantoprazole (PROTONIX) EC tablet 20 mg (has no administration in time range)  predniSONE (DELTASONE) tablet 5 mg (has no administration in time range)  sertraline (ZOLOFT) tablet 100 mg (has no administration in time range)  amLODipine (NORVASC) tablet 10 mg (has no administration in time range)  irbesartan (AVAPRO) tablet 150 mg (has no administration in time range)  acetaminophen (TYLENOL) tablet 975 mg (975 mg Oral Given 05/28/23 1641)    ED Course/ Medical Decision Making/ A&P Clinical Course as of 05/28/23 2224  Thu May 28, 2023  2022 POC occult blood, ED Negative per lab tech [LB]    Clinical Course User Index [LB] Judithann Sheen, PA                                 Medical Decision Making Amount and/or Complexity of Data Reviewed Labs: ordered. Decision-making details documented in ED Course. Radiology: ordered.  Risk OTC drugs. Prescription drug management.   Patient presents to the ED for concern of R knee pain following fall, this involves an extensive number of treatment options, and is a complaint that carries with it a high risk of complications and morbidity.  The differential diagnosis includes fx, sprain   Co morbidities that complicate the patient evaluation  HLD, HTN, CKD 2, prostate cancer, CVA (2008), PAD, DVT, insulin-dependent T2DM    Additional history obtained:  Additional history obtained from EMS, Nursing, and Outside Medical Records   External records from outside source obtained and reviewed including  EMS and triage note Neurology note from 2018 regarding deficits from stroke 0/5  strength to RUE and RLE Slow speech and slow  gait (w/ walker)   Imaging Studies ordered:  I ordered imaging studies including R knee XR  I independently visualized and interpreted imaging which showed subcutaneous edema without fracture I agree with the radiologist interpretation    Medicines ordered and prescription drug management:  I ordered medication including tylenol  for pain  Reevaluation of the patient after these medicines showed that the patient improved I have reviewed the patients home medicines and have made adjustments as needed   Problem List / ED Course:  Fall, initial encounter XR neg for fracture. Subcutaneous edema could be due to ligamentous injury. Will have outpatient ortho f/u Significant improvement of knee pain following tylenol Will provide knee immobilizer and celebrex in setting of CKD for pain  Informed by RN that EMS told them that patient was found with dried and new feces on him and were concerned about him taking care of himself and taking his medication appropriately at home as he lives alone. Patient's ex wife reports concern regarding patient being unable to walk and completing his ADLs. He states that EMS comes daily to help him but does have home health nurse that comes 2x a week? Patient is adamant that he does not want SNF placement however he needs more help at home. Family is not comfortable taking him home and leaving him there as he is having increased difficulty walking d/t recent injury. Will order [facility] consult - boarder. Home meds reviewed and ordered. Sign out to Lorin PA pending PT and TOC consult recommendations.   Reevaluation:  After the interventions noted above, I reevaluated the patient and found that they have :improved   Social Determinants of Health:  Has pcp f/u   Dispostion:  After consideration of the diagnostic results and the patients response to treatment, I feel that the patent would benefit from [facility] and PT consult. Will provide knee immobilizer and  outpatient ortho consult. Sign out to Lorin PA pending TOC, PT consult  Final Clinical Impression(s) / ED Diagnoses Final diagnoses:  Fall, initial encounter  Acute pain of right knee    Rx / DC Orders ED Discharge Orders          Ordered    celecoxib (CELEBREX) 200 MG capsule  Daily        05/28/23 1722              Judithann Sheen, PA 05/28/23 9604    Linwood Dibbles, MD 05/29/23 1105

## 2023-05-29 LAB — CBG MONITORING, ED
Glucose-Capillary: 189 mg/dL — ABNORMAL HIGH (ref 70–99)
Glucose-Capillary: 206 mg/dL — ABNORMAL HIGH (ref 70–99)
Glucose-Capillary: 301 mg/dL — ABNORMAL HIGH (ref 70–99)

## 2023-05-29 LAB — POC OCCULT BLOOD, ED: Fecal Occult Bld: NEGATIVE

## 2023-05-29 MED ORDER — POTASSIUM CHLORIDE CRYS ER 20 MEQ PO TBCR
40.0000 meq | EXTENDED_RELEASE_TABLET | Freq: Once | ORAL | Status: AC
  Administered 2023-05-29: 40 meq via ORAL
  Filled 2023-05-29: qty 2

## 2023-05-29 MED ORDER — INSULIN ASPART 100 UNIT/ML IJ SOLN
0.0000 [IU] | Freq: Three times a day (TID) | INTRAMUSCULAR | Status: DC
Start: 2023-05-29 — End: 2023-06-01
  Administered 2023-05-29: 11 [IU] via SUBCUTANEOUS
  Administered 2023-05-29: 3 [IU] via SUBCUTANEOUS
  Administered 2023-05-30: 2 [IU] via SUBCUTANEOUS
  Administered 2023-05-30: 3 [IU] via SUBCUTANEOUS
  Administered 2023-05-30 – 2023-05-31 (×2): 5 [IU] via SUBCUTANEOUS
  Administered 2023-05-31 (×2): 3 [IU] via SUBCUTANEOUS
  Administered 2023-06-01: 2 [IU] via SUBCUTANEOUS
  Administered 2023-06-01: 5 [IU] via SUBCUTANEOUS

## 2023-05-29 NOTE — [place] FL2 (Signed)
[place] MEDICAID FL2 LEVEL OF CARE FORM     IDENTIFICATION  Patient Name: [name] Birthdate: 1951-03-19 Sex: male Admission Date (Current Location): 05/28/2023  [facility] and [place] Number:  Producer, television/film/video and Address:  [facility] [place], [address] [place], [place] [address]      Provider Number: [id number]  Attending Physician Name and Address:  System, Provider Not In  Relative Name and Phone Number:  Ricahrd, Nicoloff" (Daughter)  [phone] (    Current Level of Care: Hospital Recommended Level of Care: Skilled Nursing Facility Prior Approval Number:    Date Approved/Denied:   PASRR Number: [id number]  Discharge Plan: SNF    Current Diagnoses: Patient Active Problem List   Diagnosis Date Noted   Regurgitation of food 04/02/2023   Transportation insecurity 03/13/2023   Prostate cancer (HCC) 09/29/2022   Complication of Foley catheter (HCC) 09/29/2022   Hydronephrosis    Obstructive uropathy 11/15/2021   Physical deconditioning 11/27/2020   Dysequilibrium 11/27/2020   Muscle wasting 09/25/2017   Depression 07/14/2017   Dysarthria as late effect of stroke 08/18/2016   Essential hypertension    Spastic hemiparesis (HCC)    Altered mental status 06/30/2016   Acute encephalopathy 06/30/2016   Stage 2 chronic kidney disease 06/30/2016   Cerebrovascular accident (CVA) (HCC) 10/23/2015   PAD (peripheral artery disease) (HCC) 10/20/2011   Onychomycosis of great toe 09/10/2011   Hyperlipidemia 02/21/2010   CEREBROVASCULAR ACCIDENT, HX OF 12/22/2008   T2DM (type 2 diabetes mellitus) (HCC) 07/30/2006   OBESITY, NOS 07/30/2006   Essential hypertension, benign 07/30/2006    Orientation RESPIRATION BLADDER Height & Weight     Self, Time, Place  Normal Indwelling catheter Weight: 206 lb (93.4 kg) Height:  5\' 7"  (170.2 cm)  BEHAVIORAL SYMPTOMS/MOOD NEUROLOGICAL BOWEL NUTRITION STATUS      Continent Diet (Carb  modified)  AMBULATORY STATUS COMMUNICATION OF NEEDS Skin   Extensive Assist Verbally Normal                       Personal Care Assistance Level of Assistance  Bathing, Feeding, Dressing Bathing Assistance: Maximum assistance Feeding assistance: Independent Dressing Assistance: Maximum assistance     Functional Limitations Info  Hearing, Sight, Speech Sight Info: Adequate Hearing Info: Adequate Speech Info: Adequate    SPECIAL CARE FACTORS FREQUENCY                       Contractures Contractures Info: Not present    Additional Factors Info  Code Status, Allergies Code Status Info: Full Allergies Info: Pork Allergy, Shrimp Flavor Agent (non-screening)           Current Medications (05/29/2023):  This is the current hospital active medication list Current Facility-Administered Medications  Medication Dose Route Frequency Provider Last Rate Last Admin   abiraterone acetate (ZYTIGA) tablet 1,000 mg  1,000 mg Oral Daily Judithann Sheen, PA       amLODipine (NORVASC) tablet 10 mg  10 mg Oral Daily Linwood Dibbles, MD   10 mg at 05/29/23 0943   atorvastatin (LIPITOR) tablet 40 mg  40 mg Oral Daily Judithann Sheen, PA   40 mg at 05/29/23 1014   carvedilol (COREG) tablet 12.5 mg  12.5 mg Oral BID WC [name] Heck E, PA   12.5 mg at 05/29/23 1756   clopidogrel (PLAVIX) tablet 75 mg  75 mg Oral Daily Judithann Sheen, PA   75  mg at 05/29/23 0943   empagliflozin (JARDIANCE) tablet 10 mg  10 mg Oral Daily Judithann Sheen, PA   10 mg at 05/29/23 1014   insulin aspart (novoLOG) injection 0-15 Units  0-15 Units Subcutaneous TID WC Elayne Snare K, DO   3 Units at 05/29/23 1756   insulin glargine-yfgn [facility]) injection 10 Units  10 Units Subcutaneous Daily Judithann Sheen, PA   10 Units at 05/29/23 1015   irbesartan (AVAPRO) tablet 150 mg  150 mg Oral Daily Linwood Dibbles, MD   150 mg at 05/29/23 1015   metFORMIN (GLUCOPHAGE) tablet 1,000 mg  1,000 mg Oral BID WC Judithann Sheen, PA   1,000 mg at 05/29/23 0837   pantoprazole (PROTONIX) EC tablet 20 mg  20 mg Oral Daily Judithann Sheen, PA   20 mg at 05/29/23 1914   predniSONE (DELTASONE) tablet 5 mg  5 mg Oral Daily Judithann Sheen, PA   5 mg at 05/29/23 7829   sertraline (ZOLOFT) tablet 100 mg  100 mg Oral Daily Judithann Sheen, PA   100 mg at 05/29/23 1014   Current Outpatient Medications  Medication Sig Dispense Refill   abiraterone acetate (ZYTIGA) 250 MG tablet Take 1,000 mg by mouth daily.     amlodipine-olmesartan (AZOR) 10-20 MG tablet TAKE 1 TABLET BY MOUTH EVERY DAY 90 tablet 1   atorvastatin (LIPITOR) 40 MG tablet Take 1 tablet (40 mg total) by mouth daily. 90 tablet 3   carvedilol (COREG) 12.5 MG tablet Take 1 tablet (12.5 mg total) by mouth 2 (two) times daily with a meal. 180 tablet 3   celecoxib (CELEBREX) 200 MG capsule Take 1 capsule (200 mg total) by mouth daily. 30 capsule 0   clopidogrel (PLAVIX) 75 MG tablet Take 1 tablet (75 mg total) by mouth daily. 90 tablet 2   empagliflozin (JARDIANCE) 10 MG TABS tablet Take 1 tablet (10 mg total) by mouth daily. 30 tablet 3   insulin glargine (LANTUS SOLOSTAR) 100 UNIT/ML Solostar Pen Inject 12 Units into the skin daily. 15 mL 3   metFORMIN (GLUCOPHAGE) 1000 MG tablet Take 1 tablet (1,000 mg total) by mouth 2 (two) times daily with a meal. 180 tablet 3   pantoprazole (PROTONIX) 20 MG tablet TAKE 1 TABLET BY MOUTH EVERY DAY 90 tablet 1   predniSONE (DELTASONE) 5 MG tablet Take 5 mg by mouth daily.     sertraline (ZOLOFT) 100 MG tablet Take 1 tablet (100 mg total) by mouth daily. 90 tablet 2   tamsulosin (FLOMAX) 0.4 MG CAPS capsule Take 0.4 mg by mouth daily.       Discharge Medications: Please see discharge summary for a list of discharge medications.  Relevant Imaging Results:  Relevant Lab Results:   Additional Information [id number]  Susa Simmonds, LCSWA

## 2023-05-29 NOTE — Progress Notes (Signed)
Patient has reported that he does not want to go to a skilled nursing facility. CSW spoke with patients daughter Karoline Caldwell, [phone] and ex-wife [name]. Family reports that patient has no option and will need to go to SNF because there is no one to care for patient. CSW was informed that patient does not have medicaid. CSW advised family they would need to apply for medicaid or pay privately if they want patient in the facility long term. Patients family stated they would like patient to return to [facility]. SNF bed search started.

## 2023-05-29 NOTE — Evaluation (Signed)
Physical Therapy Evaluation Patient Details Name: [name] MRN: [id number] DOB: [birth year] Today's Date: 05/29/2023  History of Present Illness  Pt is a 72 y.o. male who presented 05/28/23 with R knee pain s/p fall. Imaging of R knee negative for acute fx or dislocation. Plan for follow-up with outpatient ortho. 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 PTA, he was mod I utilizing a hemi-walker for functional mobility, living alone in a 1-level house with a ramped entrance. He has intermittent support available to him from friends, family, and a PCA that comes x5 days/week for x2 hours/day. Currently, pt is limited in R lower extremity weightbearing and balance by his R knee pain. His R knee pain is reproduced with weightbearing and R knee anterior drawer test (mild laxity noted?). Notified MD and requested a more durable brace, like a bledsoe brace, per pt request as the knee sleeve did not assist in reducing his pain and providing the knee with much support upon standing. Noted R knee buckling when attempting to ambulate. Pt has baseline R-sided weakness from a prior CVA. At this time, pt is requiring modA for bed mobility and transfers and maxA to take a couple lateral steps with HHA. He is at high risk for subsequent falls and would be unsafe to be home alone at this time. Pt could benefit from short-term inpatient rehab, < 3 hours/day, to maximize his safety and independence with functional mobility prior to return home. Will continue to follow acutely.        If plan is discharge home, recommend the following: A lot of help with bathing/dressing/bathroom;Two people to help with walking and/or transfers;Assistance with cooking/housework;Assist for transportation;Help with stairs or ramp for entrance;Direct  supervision/assist for medications management;Direct supervision/assist for financial management   Can travel by private vehicle   No    Equipment Recommendations Wheelchair (measurements PT);Wheelchair cushion (measurements PT);Hospital bed (pending progress)  Recommendations for Other Services  OT consult    Functional Status Assessment Patient has had a recent decline in their functional status and demonstrates the ability to make significant improvements in function in a reasonable and predictable amount of time.     Precautions / Restrictions Precautions Precautions: Fall Precaution Comments: baseline R hemi Required Braces or Orthoses: Other Brace Other Brace: R knee sleeve Restrictions Weight Bearing Restrictions Per Provider Order: No      Mobility  Bed Mobility Overal bed mobility: Needs Assistance Bed Mobility: Supine to Sit, Sit to Supine     Supine to sit: Mod assist, HOB elevated Sit to supine: Mod assist, HOB elevated   General bed mobility comments: Pt needed L HHA to pull up to sit R EOB (simulating bedrail at home). ModA needed to complete the ascension of his trunk and pivot his hips to square up with EOB. ModA needed to direct trunk and swing legs up onto bed to return to supine due to pt having difficulty scooting back on EOB to ensure his safety.    Transfers Overall transfer level: Needs assistance Equipment used: Rolling walker (2 wheels), 1 person hand held assist Transfers: Sit to/from Stand Sit to Stand: Mod assist           General transfer comment: Pt initially trying to stand from edge of stretcher to RW but pt appeared unstable and was having difficulty  extending his legs, thus transitioned to anterior approach HHA to provide better control/stability to pt. ModA to power up to stand, extend legs, and gain balance, providing verbal and tactile cues at hips    Ambulation/Gait Ambulation/Gait assistance: Max assist Gait Distance (Feet): 1  Feet Assistive device: 1 person hand held assist Gait Pattern/deviations: Step-to pattern, Decreased stance time - right, Decreased weight shift to right, Decreased stride length, Knees buckling, Knee flexed in stance - right, Trunk flexed, Antalgic Gait velocity: reduced Gait velocity interpretation: <1.31 ft/sec, indicative of household ambulator   General Gait Details: Pt takes slow, small, unsteady, antalgic steps to R along edge of stretcher with HHA on therapist anterior to him and maxA for balance, weight shifting, and blocking R knee during stance phase due to buckling noted.  Stairs            Wheelchair Mobility     Tilt Bed    Modified Rankin (Stroke Patients Only)       Balance Overall balance assessment: Needs assistance Sitting-balance support: No upper extremity supported, Feet supported Sitting balance-Leahy Scale: Fair Sitting balance - Comments: static sitting EOB with CGA for safety   Standing balance support: Bilateral upper extremity supported, Single extremity supported, During functional activity Standing balance-Leahy Scale: Poor Standing balance comment: Reliant on L UE at the minimum and mod-maxA for standing balance                             Pertinent Vitals/Pain Pain Assessment Pain Assessment: Faces Faces Pain Scale: Hurts even more Pain Location: R knee with anterior drawer test and weightbearing Pain Descriptors / Indicators: Discomfort, Grimacing, Guarding, Sharp, Sore Pain Intervention(s): Monitored during session, Limited activity within patient's tolerance, Repositioned    Home Living Family/patient expects to be discharged to:: Private residence Living Arrangements: Alone Available Help at Discharge: Family;Friend(s);Available PRN/intermittently Type of Home: House Home Access: Ramped entrance       Home Layout: One level Home Equipment: Cane - quad;BSC/3in1;Tub bench;Rollator (4 wheels);Rolling Walker (2  wheels);Other (comment) (hemi-walker; rail on bed) Additional Comments: PCA comes 2 hours/day x5 days/week    Prior Function Prior Level of Function : Needs assist             Mobility Comments: Uses Hemi-walker mod I, x5 falls this past year ADLs Comments: Reports he cooks and cleans, sponge bathes, and dresses himself. He does not drive     Extremity/Trunk Assessment   Upper Extremity Assessment Upper Extremity Assessment: Defer to OT evaluation    Lower Extremity Assessment Lower Extremity Assessment: RLE deficits/detail RLE Deficits / Details: hx of R weakness from prior CVA, noted gross MMT scores of 4- to 4; rests with hip externally rotated and knee flexed; pain and potential mild laxity with anterior drawer test, no pain or laxity noted with posterior drawer test or valgus/varus stress tests; noted wound at medial lower leg    Cervical / Trunk Assessment Cervical / Trunk Assessment: Normal  Communication   Communication Communication: Difficulty communicating thoughts/reduced clarity of speech (baseline dysarthria)  Cognition Arousal: Alert Behavior During Therapy: WFL for tasks assessed/performed Overall Cognitive Status: Within Functional Limits for tasks assessed                                 General Comments: slowed processing at times with noted deficits in awareness, but likely baseline  General Comments General comments (skin integrity, edema, etc.): educated pt to work on straightening out his leg in bed to reduce risk of contracture; notified MD of pain and potential laxity with anterior drawer test at R knee and pt's request for a more durable knee brace, ?bledsoe brace?; applied bandage to wound at medial R lower leg per pt request    Exercises     Assessment/Plan    PT Assessment Patient needs continued PT services  PT Problem List Decreased strength;Decreased range of motion;Decreased activity tolerance;Decreased  balance;Decreased mobility;Decreased cognition;Pain;Decreased skin integrity       PT Treatment Interventions DME instruction;Gait training;Functional mobility training;Therapeutic activities;Therapeutic exercise;Balance training;Neuromuscular re-education;Patient/family education;Cognitive remediation    PT Goals (Current goals can be found in the Care Plan section)  Acute Rehab PT Goals Patient Stated Goal: to reduce R knee pain PT Goal Formulation: With patient Time For Goal Achievement: 06/12/23 Potential to Achieve Goals: Good    Frequency Min 1X/week     Co-evaluation               AM-PAC PT "6 Clicks" Mobility  Outcome Measure Help needed turning from your back to your side while in a flat bed without using bedrails?: A Little Help needed moving from lying on your back to sitting on the side of a flat bed without using bedrails?: A Lot Help needed moving to and from a bed to a chair (including a wheelchair)?: A Lot Help needed standing up from a chair using your arms (e.g., wheelchair or bedside chair)?: A Lot Help needed to walk in hospital room?: Total Help needed climbing 3-5 steps with a railing? : Total 6 Click Score: 11    End of Session Equipment Utilized During Treatment: Gait belt;Other (comment) (R knee sleeve) Activity Tolerance: Patient tolerated treatment well;Patient limited by pain Patient left: in bed;with call bell/phone within reach Nurse Communication: Other (comment) (notified MD of pain and potential laxity with anterior drawer test at R knee and pt's request for a more durable knee brace, ?bledsoe brace?) PT Visit Diagnosis: Unsteadiness on feet (R26.81);Other abnormalities of gait and mobility (R26.89);Muscle weakness (generalized) (M62.81);Difficulty in walking, not elsewhere classified (R26.2);Pain;History of falling (Z91.81);Repeated falls (R29.6) Pain - Right/Left: Right Pain - part of body: Knee    Time: 1610-9604 PT Time Calculation  (min) (ACUTE ONLY): 42 min   Charges:   PT Evaluation $PT Eval Moderate Complexity: 1 Mod PT Treatments $Therapeutic Activity: 23-37 mins PT General Charges $$ ACUTE PT VISIT: 1 Visit         Virgil Benedict, PT, DPT Acute Rehabilitation Services  Office: [phone]   Bettina Gavia 05/29/2023, 9:57 AM

## 2023-05-29 NOTE — ED Provider Notes (Signed)
Emergency Medicine Observation Re-evaluation Note  [name] is a 72 y.o. male, seen on rounds today.  Pt initially presented to the ED for complaints of Fall Currently, the patient is eating breakfast, no new complaints today.  Physical Exam  BP (!) 171/61   Pulse 65   Temp 99.1 F (37.3 C) (Oral)   Resp (!) 23   Ht 5\' 7"  (1.702 m)   Wt 93.4 kg   SpO2 99%   BMI 32.26 kg/m  Physical Exam General: Awake and alert, no acute distress Cardiac: regular rate Lungs: No increased WOB Psych: calm cooperative  ED Course / MDM  EKG:EKG Interpretation Date/Time:  Thursday May 28 2023 18:06:01 EST Ventricular Rate:  56 PR Interval:  176 QRS Duration:  77 QT Interval:  460 QTC Calculation: 444 R Axis:   34  Text Interpretation: Sinus rhythm Atrial premature complexes Probable left atrial enlargement Borderline T abnormalities, anterior leads No significant change since last tracing Confirmed by Linwood Dibbles [phone]) on 05/28/2023 7:21:24 PM  I have reviewed the labs performed to date as well as medications administered while in observation.  Recent changes in the last 24 hours include medically cleared. Recommended PT eval for possible SNF in the setting of his weakness. Pending PT this morning.  Plan  Current plan is for PT eval for possible SNF.    Rexford Maus, DO 05/29/23 [phone]

## 2023-05-29 NOTE — Inpatient Diabetes Management (Signed)
Inpatient Diabetes Program Recommendations  AACE/ADA: New Consensus Statement on Inpatient Glycemic Control (2015)  Target Ranges:  Prepandial:   less than 140 mg/dL      Peak postprandial:   less than 180 mg/dL (1-2 hours)      Critically ill patients:  140 - 180 mg/dL    Latest Reference Range & Units 05/28/23 18:18  Glucose 70 - 99 mg/dL 782 (H)  (H): Data is abnormally high   Admit with: R Knee pain after Fall  History: DM2, CKD, CVA  Home DM Meds: Jardiance 10 mg daily        Metformin 1000 mg BID        Lantus 12 units daily  Current Orders: Jardiance 10 mg daily      Metformin 1000 mg BID      Semglee 10 units daily    MD- Please add orders for Novolog SSI  Novolog Moderate Correction Scale/ SSI (0-15 units) TID AC + HS     --Will follow patient during hospitalization--  Ambrose Finland RN, MSN, CDCES Diabetes Coordinator Inpatient Glycemic Control Team Team Pager: [phone] (8a-5p)

## 2023-05-29 NOTE — Progress Notes (Signed)
Orthopedic Tech Progress Note Patient Details:  [name] 10/23/1950 [id number] Called in order to [facility] for Bledsoe brace Patient ID: [name], male   DOB: 1951-02-15, 72 y.o.   MRN: [id number]  [name] 05/29/2023, 10:02 AM

## 2023-05-29 NOTE — ED Notes (Signed)
Ortho tech notified of brace order

## 2023-05-30 LAB — CBG MONITORING, ED
Glucose-Capillary: 175 mg/dL — ABNORMAL HIGH (ref 70–99)
Glucose-Capillary: 205 mg/dL — ABNORMAL HIGH (ref 70–99)
Glucose-Capillary: 145 mg/dL — ABNORMAL HIGH (ref 70–99)
Glucose-Capillary: 153 mg/dL — ABNORMAL HIGH (ref 70–99)

## 2023-05-30 MED ORDER — ACETAMINOPHEN 325 MG PO TABS
650.0000 mg | ORAL_TABLET | Freq: Four times a day (QID) | ORAL | Status: DC | PRN
  Administered 2023-05-30 – 2023-06-01 (×6): 650 mg via ORAL
  Filled 2023-05-30 (×6): qty 2

## 2023-05-30 NOTE — Evaluation (Signed)
Occupational Therapy Evaluation Patient Details Name: [name] MRN: [id number] DOB: 1951/01/30 Today's Date: 05/30/2023   History of Present Illness Pt is a 72 y.o. male who presented 05/28/23 with R knee pain s/p fall. Imaging of R knee negative for acute fx or dislocation. Plan for follow-up with outpatient ortho. 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  PTA he lived at home with PCA assist 5x/wk.  Patient used a hemi walker and continued to participate with his ADL and iADL.  Patient is presenting below his baseline function and will benefit from continued inpatient follow up therapy, <3 hours/day.  OT will continue efforts in the acute setting.  Patient does not have the assist at home to transition there directly.          If plan is discharge home, recommend the following: A lot of help with bathing/dressing/bathroom;Two people to help with walking and/or transfers;Assist for transportation;Assistance with cooking/housework    Functional Status Assessment  Patient has had a recent decline in their functional status and demonstrates the ability to make significant improvements in function in a reasonable and predictable amount of time.  Equipment Recommendations  None recommended by OT    Recommendations for Other Services       Precautions / Restrictions Precautions Precautions: Fall Precaution Comments: baseline R hemi Required Braces or Orthoses: Other Brace Other Brace: Bledsoe brace in room Restrictions Weight Bearing Restrictions Per Provider Order: No      Mobility Bed Mobility Overal bed mobility: Needs Assistance Bed Mobility: Supine to Sit, Sit to Supine     Supine to sit: Mod assist, HOB elevated Sit to supine: Mod assist, HOB elevated        Transfers Overall transfer level: Needs assistance Equipment used:  Rolling walker (2 wheels), 1 person hand held assist Transfers: Sit to/from Stand Sit to Stand: Mod assist           General transfer comment: unable to weight shift or place weight through R leg to attempt pivot to recliner      Balance Overall balance assessment: Needs assistance Sitting-balance support: No upper extremity supported, Feet supported Sitting balance-Leahy Scale: Fair   Postural control: Posterior lean, Left lateral lean Standing balance support: Bilateral upper extremity supported, Reliant on assistive device for balance Standing balance-Leahy Scale: Poor                             ADL either performed or assessed with clinical judgement   ADL Overall ADL's : Needs assistance/impaired     Grooming: Wash/dry hands;Wash/dry face;Minimal assistance;Sitting   Upper Body Bathing: Moderate assistance;Sitting   Lower Body Bathing: Maximal assistance   Upper Body Dressing : Moderate assistance;Sitting   Lower Body Dressing: Maximal assistance;Total assistance;Sit to/from stand   Toilet Transfer: Maximal assistance;Squat-pivot;BSC/3in1   Toileting- Clothing Manipulation and Hygiene: Total assistance               Vision Patient Visual Report: No change from baseline       Perception Perception: Not tested       Praxis Praxis: Not tested       Pertinent Vitals/Pain Pain Assessment Pain Assessment: Faces Faces Pain Scale: Hurts little more Pain Location: R knee Pain Descriptors / Indicators: Sore Pain Intervention(s): Monitored during session  Extremity/Trunk Assessment Upper Extremity Assessment Upper Extremity Assessment: RUE deficits/detail RUE Deficits / Details: tone with contractures from prior CVA RUE Sensation: WNL RUE Coordination: WNL   Lower Extremity Assessment Lower Extremity Assessment: Defer to PT evaluation   Cervical / Trunk Assessment Cervical / Trunk Assessment: Normal   Communication  Communication Communication: Difficulty communicating thoughts/reduced clarity of speech   Cognition Arousal: Alert Behavior During Therapy: WFL for tasks assessed/performed Overall Cognitive Status: Within Functional Limits for tasks assessed                                       General Comments   VSS on RA    Exercises     Shoulder Instructions      Home Living Family/patient expects to be discharged to:: Private residence Living Arrangements: Alone Available Help at Discharge: Family;Friend(s);Available PRN/intermittently Type of Home: House Home Access: Ramped entrance     Home Layout: One level     Bathroom Shower/Tub: Walk-in shower;Sponge bathes at baseline   Bathroom Toilet: Handicapped height Bathroom Accessibility: Yes   Home Equipment: Cane - quad;BSC/3in1;Tub bench;Rollator (4 wheels);Rolling Walker (2 wheels);Other (comment)   Additional Comments: PCA comes 2 hours/day x5 days/week      Prior Functioning/Environment Prior Level of Function : Needs assist             Mobility Comments: Uses Hemi-walker mod I, x5 falls this past year ADLs Comments: Reports he cooks and cleans, sponge bathes, and dresses himself. He does not drive        OT Problem List: Decreased strength;Decreased range of motion;Decreased activity tolerance;Impaired balance (sitting and/or standing);Pain;Impaired UE functional use;Impaired tone      OT Treatment/Interventions: Self-care/ADL training;Therapeutic activities;Patient/family education;Balance training;DME and/or AE instruction    OT Goals(Current goals can be found in the care plan section) Acute Rehab OT Goals Patient Stated Goal: Reduce knee pain and move better OT Goal Formulation: With patient Time For Goal Achievement: 06/15/23 Potential to Achieve Goals: Fair ADL Goals Pt Will Perform Grooming: with set-up;sitting Pt Will Perform Upper Body Bathing: with min assist;sitting Pt Will Perform  Upper Body Dressing: with min assist;sitting Pt Will Transfer to Toilet: with mod assist;stand pivot transfer;ambulating  OT Frequency: Min 1X/week    Co-evaluation              AM-PAC OT "6 Clicks" Daily Activity     Outcome Measure Help from another person eating meals?: A Little Help from another person taking care of personal grooming?: A Little Help from another person toileting, which includes using toliet, bedpan, or urinal?: A Lot Help from another person bathing (including washing, rinsing, drying)?: A Lot Help from another person to put on and taking off regular upper body clothing?: A Lot Help from another person to put on and taking off regular lower body clothing?: A Lot 6 Click Score: 14   End of Session Nurse Communication: Mobility status  Activity Tolerance: Patient tolerated treatment well Patient left: in bed;with call bell/phone within reach  OT Visit Diagnosis: Unsteadiness on feet (R26.81);Muscle weakness (generalized) (M62.81);History of falling (Z91.81);Pain Pain - Right/Left: Right Pain - part of body: Knee                Time: 9604-5409 OT Time Calculation (min): 18 min Charges:  OT General Charges $OT Visit: 1 Visit OT Evaluation $OT Eval Moderate Complexity: 1 Mod  05/30/2023  RP, OTR/L  Acute Rehabilitation Services  Office:  [phone]   Suzanna Obey 05/30/2023, 10:23 AM

## 2023-05-30 NOTE — Progress Notes (Addendum)
9:30am: CSW obtained bed offer from [facility].  CSW spoke with patient's daughter Karoline Caldwell who is agreeable to accept bed offer from [facility].  CSW spoke with Alphonzo Lemmings at [facility] to request she initiate insurance authorization.  8:55am: CSW faxed patient's clinicals out to obtain bed offers.  Edwin Dada, MSW, LCSW Transitions of Care  Clinical Social Worker II [phone]

## 2023-05-30 NOTE — ED Provider Notes (Signed)
Emergency Medicine Observation Re-evaluation Note  [name] is a 72 y.o. male, seen on rounds today.  Pt initially presented to the ED for complaints of Fall Currently, the patient is awaiting possible placement.  Physical Exam  BP (!) 165/51 (BP Location: Left Arm)   Pulse 76   Temp 97.9 F (36.6 C) (Oral)   Resp 17   Ht 5\' 7"  (1.702 m)   Wt 93.4 kg   SpO2 100%   BMI 32.26 kg/m  Physical Exam General: Calm, resting comfortably Cardiac: Regular rate Lungs: Normal respiratory effort Psych: Calm, cooperative  ED Course / MDM  EKG:EKG Interpretation Date/Time:  Thursday May 28 2023 18:06:01 EST Ventricular Rate:  56 PR Interval:  176 QRS Duration:  77 QT Interval:  460 QTC Calculation: 444 R Axis:   34  Text Interpretation: Sinus rhythm Atrial premature complexes Probable left atrial enlargement Borderline T abnormalities, anterior leads No significant change since last tracing Confirmed by Linwood Dibbles [phone]) on 05/28/2023 7:21:24 PM  I have reviewed the labs performed to date as well as medications administered while in observation.  Recent changes in the last 24 hours include no acute changes.  Plan  Current plan is for PT evaluation and possible placement.    Laurence Spates, MD 05/30/23 1247

## 2023-05-31 LAB — CBG MONITORING, ED
Glucose-Capillary: 161 mg/dL — ABNORMAL HIGH (ref 70–99)
Glucose-Capillary: 231 mg/dL — ABNORMAL HIGH (ref 70–99)
Glucose-Capillary: 157 mg/dL — ABNORMAL HIGH (ref 70–99)
Glucose-Capillary: 157 mg/dL — ABNORMAL HIGH (ref 70–99)

## 2023-05-31 NOTE — Progress Notes (Signed)
CSW spoke with patient's daughter Karoline Caldwell to provide her with updates regarding patient's pending insurance authorization.  Edwin Dada, MSW, LCSW Transitions of Care  Clinical Social Worker II [phone]

## 2023-05-31 NOTE — ED Provider Notes (Signed)
Emergency Medicine Observation Re-evaluation Note  [name] is a 72 y.o. male, seen on rounds today.  Pt initially presented to the ED for complaints of Fall Currently, the patient is resting in bed.  No complaints or concerns.  Physical Exam  BP (!) 165/51 (BP Location: Left Arm)   Pulse 76   Temp 98.1 F (36.7 C) (Oral)   Resp 17   Ht 5\' 7"  (1.702 m)   Wt 93.4 kg   SpO2 100%   BMI 32.26 kg/m  Physical Exam General: Resting comfortably Cardiac: Regular rate Lungs: Normal respiratory effort Psych: Calm, cooperative  ED Course / MDM  EKG:EKG Interpretation Date/Time:  Thursday May 28 2023 18:06:01 EST Ventricular Rate:  56 PR Interval:  176 QRS Duration:  77 QT Interval:  460 QTC Calculation: 444 R Axis:   34  Text Interpretation: Sinus rhythm Atrial premature complexes Probable left atrial enlargement Borderline T abnormalities, anterior leads No significant change since last tracing Confirmed by Linwood Dibbles [phone]) on 05/28/2023 7:21:24 PM  I have reviewed the labs performed to date as well as medications administered while in observation.  Recent changes in the last 24 hours include no changes.  Plan  Current plan is for possible placement.    Laurence Spates, MD 05/31/23 [phone]

## 2023-06-01 DIAGNOSIS — R279 Unspecified lack of coordination: Secondary | ICD-10-CM | POA: Diagnosis not present

## 2023-06-01 DIAGNOSIS — F32A Depression, unspecified: Secondary | ICD-10-CM | POA: Diagnosis not present

## 2023-06-01 DIAGNOSIS — L97811 Non-pressure chronic ulcer of other part of right lower leg limited to breakdown of skin: Secondary | ICD-10-CM | POA: Diagnosis not present

## 2023-06-01 DIAGNOSIS — I6930 Unspecified sequelae of cerebral infarction: Secondary | ICD-10-CM | POA: Diagnosis not present

## 2023-06-01 DIAGNOSIS — Z794 Long term (current) use of insulin: Secondary | ICD-10-CM | POA: Diagnosis not present

## 2023-06-01 DIAGNOSIS — S80919A Unspecified superficial injury of unspecified knee, initial encounter: Secondary | ICD-10-CM | POA: Diagnosis not present

## 2023-06-01 DIAGNOSIS — E785 Hyperlipidemia, unspecified: Secondary | ICD-10-CM | POA: Diagnosis not present

## 2023-06-01 DIAGNOSIS — Z7401 Bed confinement status: Secondary | ICD-10-CM | POA: Diagnosis not present

## 2023-06-01 DIAGNOSIS — M6281 Muscle weakness (generalized): Secondary | ICD-10-CM | POA: Diagnosis not present

## 2023-06-01 DIAGNOSIS — N139 Obstructive and reflux uropathy, unspecified: Secondary | ICD-10-CM | POA: Diagnosis not present

## 2023-06-01 DIAGNOSIS — Z466 Encounter for fitting and adjustment of urinary device: Secondary | ICD-10-CM | POA: Diagnosis not present

## 2023-06-01 DIAGNOSIS — R1311 Dysphagia, oral phase: Secondary | ICD-10-CM | POA: Diagnosis not present

## 2023-06-01 DIAGNOSIS — W1830XD Fall on same level, unspecified, subsequent encounter: Secondary | ICD-10-CM | POA: Diagnosis not present

## 2023-06-01 DIAGNOSIS — K219 Gastro-esophageal reflux disease without esophagitis: Secondary | ICD-10-CM | POA: Diagnosis not present

## 2023-06-01 DIAGNOSIS — E119 Type 2 diabetes mellitus without complications: Secondary | ICD-10-CM | POA: Diagnosis not present

## 2023-06-01 DIAGNOSIS — I69320 Aphasia following cerebral infarction: Secondary | ICD-10-CM | POA: Diagnosis not present

## 2023-06-01 DIAGNOSIS — Z978 Presence of other specified devices: Secondary | ICD-10-CM | POA: Diagnosis not present

## 2023-06-01 DIAGNOSIS — I639 Cerebral infarction, unspecified: Secondary | ICD-10-CM | POA: Diagnosis not present

## 2023-06-01 DIAGNOSIS — E1169 Type 2 diabetes mellitus with other specified complication: Secondary | ICD-10-CM | POA: Diagnosis not present

## 2023-06-01 DIAGNOSIS — G8191 Hemiplegia, unspecified affecting right dominant side: Secondary | ICD-10-CM | POA: Diagnosis not present

## 2023-06-01 DIAGNOSIS — E118 Type 2 diabetes mellitus with unspecified complications: Secondary | ICD-10-CM | POA: Diagnosis not present

## 2023-06-01 DIAGNOSIS — M25561 Pain in right knee: Secondary | ICD-10-CM | POA: Diagnosis not present

## 2023-06-01 DIAGNOSIS — M25562 Pain in left knee: Secondary | ICD-10-CM | POA: Diagnosis not present

## 2023-06-01 DIAGNOSIS — C61 Malignant neoplasm of prostate: Secondary | ICD-10-CM | POA: Diagnosis not present

## 2023-06-01 DIAGNOSIS — L97812 Non-pressure chronic ulcer of other part of right lower leg with fat layer exposed: Secondary | ICD-10-CM | POA: Diagnosis not present

## 2023-06-01 DIAGNOSIS — W1830XA Fall on same level, unspecified, initial encounter: Secondary | ICD-10-CM | POA: Diagnosis not present

## 2023-06-01 DIAGNOSIS — R278 Other lack of coordination: Secondary | ICD-10-CM | POA: Diagnosis not present

## 2023-06-01 DIAGNOSIS — R410 Disorientation, unspecified: Secondary | ICD-10-CM | POA: Diagnosis not present

## 2023-06-01 DIAGNOSIS — N182 Chronic kidney disease, stage 2 (mild): Secondary | ICD-10-CM | POA: Diagnosis not present

## 2023-06-01 DIAGNOSIS — Z743 Need for continuous supervision: Secondary | ICD-10-CM | POA: Diagnosis not present

## 2023-06-01 DIAGNOSIS — R338 Other retention of urine: Secondary | ICD-10-CM | POA: Diagnosis not present

## 2023-06-01 DIAGNOSIS — I739 Peripheral vascular disease, unspecified: Secondary | ICD-10-CM | POA: Diagnosis not present

## 2023-06-01 DIAGNOSIS — I10 Essential (primary) hypertension: Secondary | ICD-10-CM | POA: Diagnosis not present

## 2023-06-01 LAB — CBG MONITORING, ED
Glucose-Capillary: 137 mg/dL — ABNORMAL HIGH (ref 70–99)
Glucose-Capillary: 212 mg/dL — ABNORMAL HIGH (ref 70–99)

## 2023-06-01 NOTE — ED Notes (Signed)
EDP Young into see, at [facility].

## 2023-06-01 NOTE — Progress Notes (Signed)
Physical Therapy Treatment Patient Details Name: [name] MRN: [id number] DOB: 10-Mar-1951 Today's Date: 06/01/2023   History of Present Illness Pt is a 72 y.o. male who presented 05/28/23 with R knee pain s/p fall. Imaging of R knee negative for acute fx or dislocation. Plan for follow-up with outpatient ortho. 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 Pt continues to have difficulty extending RLE due to increased flexor tone and pain. Pt is unable to obtain full knee extension, lacking ~5-10 degrees passively after PT stretch. PT encourages pt to participate in active knee extension during the day. Due to incomplete knee extension on R side the pt often leans to R side when attempting standing and remains at a very high risk for falls. Patient will benefit from continued inpatient follow up therapy, <3 hours/day.   If plan is discharge home, recommend the following: A lot of help with bathing/dressing/bathroom;Two people to help with walking and/or transfers;Assistance with cooking/housework;Assist for transportation;Help with stairs or ramp for entrance;Direct supervision/assist for medications management;Direct supervision/assist for financial management   Can travel by private vehicle     No  Equipment Recommendations  Wheelchair (measurements PT);Wheelchair cushion (measurements PT);Hospital bed    Recommendations for Other Services       Precautions / Restrictions Precautions Precautions: Fall Precaution Comments: baseline R hemi Required Braces or Orthoses: Other Brace Other Brace: bledsoe brace donned, concern for brace wear for long periods of time as brace needs to be tight to remain in place and brace at lower leg does apply pressure to wound site on RLE. PT removes brace at end of session Restrictions Weight Bearing Restrictions Per Provider Order: No      Mobility  Bed Mobility Overal bed mobility: Needs Assistance Bed Mobility: Rolling, Supine to Sit, Sit to Supine Rolling: Supervision   Supine to sit: Min assist, HOB elevated Sit to supine: Mod assist        Transfers Overall transfer level: Needs assistance Equipment used: Rolling walker (2 wheels) (RW utilized similarly to hemiwalker in LUE only) Transfers: Sit to/from Stand Sit to Stand: Mod assist           General transfer comment: heavy right lean on 1st attempt. PT provides cues for widened BOS on further attempts, pt has difficulty extending RLE and at times LLE during standing    Ambulation/Gait             Pre-gait activities: 2 side steps at edge of bed, maxA due to PT assist for weight shift and assist to move RLE     Stairs             Wheelchair Mobility     Tilt Bed    Modified Rankin (Stroke Patients Only)       Balance Overall balance assessment: Needs assistance Sitting-balance support: No upper extremity supported, Feet supported Sitting balance-Leahy Scale: Fair     Standing balance support: Single extremity supported, Reliant on assistive device for balance Standing balance-Leahy Scale: Poor Standing balance comment: mod-maxA                            Cognition Arousal: Alert Behavior During Therapy: WFL for tasks assessed/performed Overall Cognitive Status: Within Functional Limits for tasks assessed  Exercises      General Comments General comments (skin integrity, edema, etc.): VSS on RA, pt incontinent of stool prior to PT arrival      Pertinent Vitals/Pain Pain Assessment Pain Assessment: Faces Faces Pain Scale: Hurts even more Pain Location: R knee Pain Descriptors / Indicators: Grimacing Pain Intervention(s): Monitored during session    Home Living                          Prior Function            PT Goals  (current goals can now be found in the care plan section) Acute Rehab PT Goals Patient Stated Goal: to reduce R knee pain Progress towards PT goals: Progressing toward goals    Frequency    Min 1X/week      PT Plan      Co-evaluation              AM-PAC PT "6 Clicks" Mobility   Outcome Measure  Help needed turning from your back to your side while in a flat bed without using bedrails?: A Little Help needed moving from lying on your back to sitting on the side of a flat bed without using bedrails?: A Lot Help needed moving to and from a bed to a chair (including a wheelchair)?: A Lot Help needed standing up from a chair using your arms (e.g., wheelchair or bedside chair)?: A Lot Help needed to walk in hospital room?: Total Help needed climbing 3-5 steps with a railing? : Total 6 Click Score: 11    End of Session Equipment Utilized During Treatment: Gait belt Activity Tolerance: Patient tolerated treatment well Patient left: in bed;with call bell/phone within reach Nurse Communication: Mobility status PT Visit Diagnosis: Unsteadiness on feet (R26.81);Other abnormalities of gait and mobility (R26.89);Muscle weakness (generalized) (M62.81);Difficulty in walking, not elsewhere classified (R26.2);Pain;History of falling (Z91.81);Repeated falls (R29.6) Pain - Right/Left: Right Pain - part of body: Knee     Time: 0272-5366 PT Time Calculation (min) (ACUTE ONLY): 35 min  Charges:    $Therapeutic Activity: 23-37 mins PT General Charges $$ ACUTE PT VISIT: 1 Visit                     Arlyss Gandy, PT, DPT Acute Rehabilitation Office [phone]    Arlyss Gandy 06/01/2023, 10:54 AM

## 2023-06-01 NOTE — ED Notes (Signed)
PT at BS.

## 2023-06-01 NOTE — ED Notes (Signed)
[facility] here for transport to [facility]

## 2023-06-01 NOTE — ED Notes (Signed)
Assessed pt buttocks for breakdown, no breakdown present. Pt refused to be turned. Stated that he can assist himself to turn to either side.

## 2023-06-01 NOTE — ED Provider Notes (Signed)
Emergency Medicine Observation Re-evaluation Note  BRENTYN SKOOG is a 72 y.o. male, seen on rounds today.  Pt initially presented to the ED for complaints of Fall Currently, the patient is resting; awoke easily. No complaints   Physical Exam  BP (!) 171/72   Pulse 63   Temp 98.1 F (36.7 C) (Oral)   Resp 18   Ht 5\' 7"  (1.702 m)   Wt 93.4 kg   SpO2 100%   BMI 32.26 kg/m  Physical Exam General:NAD  Cardiac: Normal rate  Lungs: Clear  Psych: Calm   ED Course / MDM  EKG:EKG Interpretation Date/Time:  Thursday May 28 2023 18:06:01 EST Ventricular Rate:  56 PR Interval:  176 QRS Duration:  77 QT Interval:  460 QTC Calculation: 444 R Axis:   34  Text Interpretation: Sinus rhythm Atrial premature complexes Probable left atrial enlargement Borderline T abnormalities, anterior leads No significant change since last tracing Confirmed by Linwood Dibbles [phone]) on 05/28/2023 7:21:24 PM  I have reviewed the labs performed to date as well as medications administered while in observation.  Recent changes in the last 24 hours include Accepted to [facility] .  Plan  Current plan is for discharge to [facility].    Coral Spikes, DO 06/01/23 1220

## 2023-06-01 NOTE — ED Notes (Signed)
Per Note: Patient's insurance authorization has been approved. Patient will go to [facility], room 126A via PTAR - RN to call when ready. The number to call for report is [phone].   CSW spoke with patient's daughter Karoline Caldwell to inform her of discharge plan

## 2023-06-01 NOTE — Progress Notes (Signed)
Patient's insurance authorization has been approved. Patient will go to [facility], room 126A via [facility] - RN to call when ready. The number to call for report is [phone].  CSW spoke with patient's daughter Karoline Caldwell to inform her of discharge plan.  CSW spoke with RN and MD to inform them of discharge plan.  Edwin Dada, MSW, LCSW Transitions of Care  Clinical Social Worker II [phone]

## 2023-06-01 NOTE — ED Notes (Signed)
PTAR transport initiated.

## 2023-06-05 DIAGNOSIS — I739 Peripheral vascular disease, unspecified: Secondary | ICD-10-CM | POA: Diagnosis not present

## 2023-06-05 DIAGNOSIS — I6930 Unspecified sequelae of cerebral infarction: Secondary | ICD-10-CM | POA: Diagnosis not present

## 2023-06-05 DIAGNOSIS — R338 Other retention of urine: Secondary | ICD-10-CM | POA: Diagnosis not present

## 2023-06-05 DIAGNOSIS — I69320 Aphasia following cerebral infarction: Secondary | ICD-10-CM | POA: Diagnosis not present

## 2023-06-05 DIAGNOSIS — C61 Malignant neoplasm of prostate: Secondary | ICD-10-CM | POA: Diagnosis not present

## 2023-06-05 DIAGNOSIS — M6281 Muscle weakness (generalized): Secondary | ICD-10-CM | POA: Diagnosis not present

## 2023-06-05 DIAGNOSIS — N139 Obstructive and reflux uropathy, unspecified: Secondary | ICD-10-CM | POA: Diagnosis not present

## 2023-06-05 DIAGNOSIS — I639 Cerebral infarction, unspecified: Secondary | ICD-10-CM | POA: Diagnosis not present

## 2023-06-05 DIAGNOSIS — R279 Unspecified lack of coordination: Secondary | ICD-10-CM | POA: Diagnosis not present

## 2023-06-05 DIAGNOSIS — K219 Gastro-esophageal reflux disease without esophagitis: Secondary | ICD-10-CM | POA: Diagnosis not present

## 2023-06-05 DIAGNOSIS — G8191 Hemiplegia, unspecified affecting right dominant side: Secondary | ICD-10-CM | POA: Diagnosis not present

## 2023-06-05 DIAGNOSIS — Z978 Presence of other specified devices: Secondary | ICD-10-CM | POA: Diagnosis not present

## 2023-06-05 DIAGNOSIS — R1311 Dysphagia, oral phase: Secondary | ICD-10-CM | POA: Diagnosis not present

## 2023-06-05 DIAGNOSIS — E118 Type 2 diabetes mellitus with unspecified complications: Secondary | ICD-10-CM | POA: Diagnosis not present

## 2023-06-05 DIAGNOSIS — F32A Depression, unspecified: Secondary | ICD-10-CM | POA: Diagnosis not present

## 2023-06-05 DIAGNOSIS — E119 Type 2 diabetes mellitus without complications: Secondary | ICD-10-CM | POA: Diagnosis not present

## 2023-06-05 DIAGNOSIS — E785 Hyperlipidemia, unspecified: Secondary | ICD-10-CM | POA: Diagnosis not present

## 2023-06-05 DIAGNOSIS — I10 Essential (primary) hypertension: Secondary | ICD-10-CM | POA: Diagnosis not present

## 2023-06-08 DIAGNOSIS — F32A Depression, unspecified: Secondary | ICD-10-CM | POA: Diagnosis not present

## 2023-06-08 DIAGNOSIS — I739 Peripheral vascular disease, unspecified: Secondary | ICD-10-CM | POA: Diagnosis not present

## 2023-06-08 DIAGNOSIS — C61 Malignant neoplasm of prostate: Secondary | ICD-10-CM | POA: Diagnosis not present

## 2023-06-08 DIAGNOSIS — E118 Type 2 diabetes mellitus with unspecified complications: Secondary | ICD-10-CM | POA: Diagnosis not present

## 2023-06-08 DIAGNOSIS — E119 Type 2 diabetes mellitus without complications: Secondary | ICD-10-CM | POA: Diagnosis not present

## 2023-06-08 DIAGNOSIS — Z794 Long term (current) use of insulin: Secondary | ICD-10-CM | POA: Diagnosis not present

## 2023-06-08 DIAGNOSIS — I10 Essential (primary) hypertension: Secondary | ICD-10-CM | POA: Diagnosis not present

## 2023-06-08 DIAGNOSIS — R279 Unspecified lack of coordination: Secondary | ICD-10-CM | POA: Diagnosis not present

## 2023-06-08 DIAGNOSIS — M6281 Muscle weakness (generalized): Secondary | ICD-10-CM | POA: Diagnosis not present

## 2023-06-08 DIAGNOSIS — Z978 Presence of other specified devices: Secondary | ICD-10-CM | POA: Diagnosis not present

## 2023-06-08 DIAGNOSIS — K219 Gastro-esophageal reflux disease without esophagitis: Secondary | ICD-10-CM | POA: Diagnosis not present

## 2023-06-08 DIAGNOSIS — G8191 Hemiplegia, unspecified affecting right dominant side: Secondary | ICD-10-CM | POA: Diagnosis not present

## 2023-06-08 DIAGNOSIS — I639 Cerebral infarction, unspecified: Secondary | ICD-10-CM | POA: Diagnosis not present

## 2023-06-08 DIAGNOSIS — I6930 Unspecified sequelae of cerebral infarction: Secondary | ICD-10-CM | POA: Diagnosis not present

## 2023-06-08 DIAGNOSIS — R1311 Dysphagia, oral phase: Secondary | ICD-10-CM | POA: Diagnosis not present

## 2023-06-08 DIAGNOSIS — I69320 Aphasia following cerebral infarction: Secondary | ICD-10-CM | POA: Diagnosis not present

## 2023-06-08 DIAGNOSIS — N139 Obstructive and reflux uropathy, unspecified: Secondary | ICD-10-CM | POA: Diagnosis not present

## 2023-06-08 DIAGNOSIS — E1169 Type 2 diabetes mellitus with other specified complication: Secondary | ICD-10-CM | POA: Diagnosis not present

## 2023-06-10 DIAGNOSIS — L97811 Non-pressure chronic ulcer of other part of right lower leg limited to breakdown of skin: Secondary | ICD-10-CM | POA: Diagnosis not present

## 2023-06-10 DIAGNOSIS — I69320 Aphasia following cerebral infarction: Secondary | ICD-10-CM | POA: Diagnosis not present

## 2023-06-10 DIAGNOSIS — I739 Peripheral vascular disease, unspecified: Secondary | ICD-10-CM | POA: Diagnosis not present

## 2023-06-10 DIAGNOSIS — G8191 Hemiplegia, unspecified affecting right dominant side: Secondary | ICD-10-CM | POA: Diagnosis not present

## 2023-06-10 DIAGNOSIS — R278 Other lack of coordination: Secondary | ICD-10-CM | POA: Diagnosis not present

## 2023-06-10 DIAGNOSIS — M6281 Muscle weakness (generalized): Secondary | ICD-10-CM | POA: Diagnosis not present

## 2023-06-10 DIAGNOSIS — L97812 Non-pressure chronic ulcer of other part of right lower leg with fat layer exposed: Secondary | ICD-10-CM | POA: Diagnosis not present

## 2023-06-10 DIAGNOSIS — E119 Type 2 diabetes mellitus without complications: Secondary | ICD-10-CM | POA: Diagnosis not present

## 2023-06-12 DIAGNOSIS — N182 Chronic kidney disease, stage 2 (mild): Secondary | ICD-10-CM | POA: Diagnosis not present

## 2023-06-12 DIAGNOSIS — E785 Hyperlipidemia, unspecified: Secondary | ICD-10-CM | POA: Diagnosis not present

## 2023-06-12 DIAGNOSIS — E1169 Type 2 diabetes mellitus with other specified complication: Secondary | ICD-10-CM | POA: Diagnosis not present

## 2023-06-17 DIAGNOSIS — G8191 Hemiplegia, unspecified affecting right dominant side: Secondary | ICD-10-CM | POA: Diagnosis not present

## 2023-06-17 DIAGNOSIS — E119 Type 2 diabetes mellitus without complications: Secondary | ICD-10-CM | POA: Diagnosis not present

## 2023-06-17 DIAGNOSIS — I69320 Aphasia following cerebral infarction: Secondary | ICD-10-CM | POA: Diagnosis not present

## 2023-06-17 DIAGNOSIS — R278 Other lack of coordination: Secondary | ICD-10-CM | POA: Diagnosis not present

## 2023-06-17 DIAGNOSIS — I739 Peripheral vascular disease, unspecified: Secondary | ICD-10-CM | POA: Diagnosis not present

## 2023-06-17 DIAGNOSIS — L97811 Non-pressure chronic ulcer of other part of right lower leg limited to breakdown of skin: Secondary | ICD-10-CM | POA: Diagnosis not present

## 2023-06-17 DIAGNOSIS — L97812 Non-pressure chronic ulcer of other part of right lower leg with fat layer exposed: Secondary | ICD-10-CM | POA: Diagnosis not present

## 2023-06-17 DIAGNOSIS — M6281 Muscle weakness (generalized): Secondary | ICD-10-CM | POA: Diagnosis not present

## 2023-06-18 DIAGNOSIS — R279 Unspecified lack of coordination: Secondary | ICD-10-CM | POA: Diagnosis not present

## 2023-06-18 DIAGNOSIS — I69320 Aphasia following cerebral infarction: Secondary | ICD-10-CM | POA: Diagnosis not present

## 2023-06-18 DIAGNOSIS — F32A Depression, unspecified: Secondary | ICD-10-CM | POA: Diagnosis not present

## 2023-06-18 DIAGNOSIS — R1311 Dysphagia, oral phase: Secondary | ICD-10-CM | POA: Diagnosis not present

## 2023-06-18 DIAGNOSIS — M6281 Muscle weakness (generalized): Secondary | ICD-10-CM | POA: Diagnosis not present

## 2023-06-18 DIAGNOSIS — I6930 Unspecified sequelae of cerebral infarction: Secondary | ICD-10-CM | POA: Diagnosis not present

## 2023-06-18 DIAGNOSIS — E118 Type 2 diabetes mellitus with unspecified complications: Secondary | ICD-10-CM | POA: Diagnosis not present

## 2023-06-18 DIAGNOSIS — N139 Obstructive and reflux uropathy, unspecified: Secondary | ICD-10-CM | POA: Diagnosis not present

## 2023-06-18 DIAGNOSIS — G8191 Hemiplegia, unspecified affecting right dominant side: Secondary | ICD-10-CM | POA: Diagnosis not present

## 2023-06-18 DIAGNOSIS — I10 Essential (primary) hypertension: Secondary | ICD-10-CM | POA: Diagnosis not present

## 2023-06-18 DIAGNOSIS — C61 Malignant neoplasm of prostate: Secondary | ICD-10-CM | POA: Diagnosis not present

## 2023-06-18 DIAGNOSIS — I739 Peripheral vascular disease, unspecified: Secondary | ICD-10-CM | POA: Diagnosis not present

## 2023-06-19 ENCOUNTER — Other Ambulatory Visit: Payer: Self-pay | Admitting: Family Medicine

## 2023-06-19 ENCOUNTER — Ambulatory Visit: Payer: Self-pay | Admitting: Family Medicine

## 2023-06-19 ENCOUNTER — Ambulatory Visit: Payer: Medicare HMO | Admitting: Family Medicine

## 2023-06-19 DIAGNOSIS — E1159 Type 2 diabetes mellitus with other circulatory complications: Secondary | ICD-10-CM

## 2023-06-22 DIAGNOSIS — M6281 Muscle weakness (generalized): Secondary | ICD-10-CM | POA: Diagnosis not present

## 2023-06-22 DIAGNOSIS — W1830XD Fall on same level, unspecified, subsequent encounter: Secondary | ICD-10-CM | POA: Diagnosis not present

## 2023-06-22 DIAGNOSIS — G8191 Hemiplegia, unspecified affecting right dominant side: Secondary | ICD-10-CM | POA: Diagnosis not present

## 2023-06-22 DIAGNOSIS — C61 Malignant neoplasm of prostate: Secondary | ICD-10-CM | POA: Diagnosis not present

## 2023-06-22 DIAGNOSIS — W1830XA Fall on same level, unspecified, initial encounter: Secondary | ICD-10-CM | POA: Diagnosis not present

## 2023-06-23 DIAGNOSIS — I6930 Unspecified sequelae of cerebral infarction: Secondary | ICD-10-CM | POA: Diagnosis not present

## 2023-06-23 DIAGNOSIS — I10 Essential (primary) hypertension: Secondary | ICD-10-CM | POA: Diagnosis not present

## 2023-06-23 DIAGNOSIS — C61 Malignant neoplasm of prostate: Secondary | ICD-10-CM | POA: Diagnosis not present

## 2023-06-23 DIAGNOSIS — I639 Cerebral infarction, unspecified: Secondary | ICD-10-CM | POA: Diagnosis not present

## 2023-06-23 DIAGNOSIS — G8929 Other chronic pain: Secondary | ICD-10-CM | POA: Diagnosis not present

## 2023-06-23 DIAGNOSIS — E1169 Type 2 diabetes mellitus with other specified complication: Secondary | ICD-10-CM | POA: Diagnosis not present

## 2023-06-23 DIAGNOSIS — W1830XA Fall on same level, unspecified, initial encounter: Secondary | ICD-10-CM | POA: Diagnosis not present

## 2023-06-23 DIAGNOSIS — W1830XD Fall on same level, unspecified, subsequent encounter: Secondary | ICD-10-CM | POA: Diagnosis not present

## 2023-06-23 DIAGNOSIS — E118 Type 2 diabetes mellitus with unspecified complications: Secondary | ICD-10-CM | POA: Diagnosis not present

## 2023-06-23 DIAGNOSIS — G8191 Hemiplegia, unspecified affecting right dominant side: Secondary | ICD-10-CM | POA: Diagnosis not present

## 2023-06-23 DIAGNOSIS — I69320 Aphasia following cerebral infarction: Secondary | ICD-10-CM | POA: Diagnosis not present

## 2023-06-23 DIAGNOSIS — E785 Hyperlipidemia, unspecified: Secondary | ICD-10-CM | POA: Diagnosis not present

## 2023-06-23 DIAGNOSIS — F32A Depression, unspecified: Secondary | ICD-10-CM | POA: Diagnosis not present

## 2023-06-23 DIAGNOSIS — K219 Gastro-esophageal reflux disease without esophagitis: Secondary | ICD-10-CM | POA: Diagnosis not present

## 2023-06-23 DIAGNOSIS — M6281 Muscle weakness (generalized): Secondary | ICD-10-CM | POA: Diagnosis not present

## 2023-06-23 DIAGNOSIS — R1311 Dysphagia, oral phase: Secondary | ICD-10-CM | POA: Diagnosis not present

## 2023-06-23 DIAGNOSIS — R279 Unspecified lack of coordination: Secondary | ICD-10-CM | POA: Diagnosis not present

## 2023-06-23 DIAGNOSIS — I739 Peripheral vascular disease, unspecified: Secondary | ICD-10-CM | POA: Diagnosis not present

## 2023-06-23 DIAGNOSIS — N139 Obstructive and reflux uropathy, unspecified: Secondary | ICD-10-CM | POA: Diagnosis not present

## 2023-06-24 DIAGNOSIS — W1830XA Fall on same level, unspecified, initial encounter: Secondary | ICD-10-CM | POA: Diagnosis not present

## 2023-06-24 DIAGNOSIS — W1830XD Fall on same level, unspecified, subsequent encounter: Secondary | ICD-10-CM | POA: Diagnosis not present

## 2023-06-24 DIAGNOSIS — C61 Malignant neoplasm of prostate: Secondary | ICD-10-CM | POA: Diagnosis not present

## 2023-06-24 DIAGNOSIS — G8191 Hemiplegia, unspecified affecting right dominant side: Secondary | ICD-10-CM | POA: Diagnosis not present

## 2023-06-24 DIAGNOSIS — M6281 Muscle weakness (generalized): Secondary | ICD-10-CM | POA: Diagnosis not present

## 2023-06-25 ENCOUNTER — Ambulatory Visit: Payer: Medicare HMO | Admitting: Orthopedic Surgery

## 2023-06-25 VITALS — BP 96/59 | HR 53 | Ht 67.0 in | Wt 206.0 lb

## 2023-06-25 DIAGNOSIS — M6281 Muscle weakness (generalized): Secondary | ICD-10-CM | POA: Diagnosis not present

## 2023-06-25 DIAGNOSIS — W1830XD Fall on same level, unspecified, subsequent encounter: Secondary | ICD-10-CM | POA: Diagnosis not present

## 2023-06-25 DIAGNOSIS — I739 Peripheral vascular disease, unspecified: Secondary | ICD-10-CM | POA: Diagnosis not present

## 2023-06-25 DIAGNOSIS — E119 Type 2 diabetes mellitus without complications: Secondary | ICD-10-CM | POA: Diagnosis not present

## 2023-06-25 DIAGNOSIS — I69320 Aphasia following cerebral infarction: Secondary | ICD-10-CM | POA: Diagnosis not present

## 2023-06-25 DIAGNOSIS — R278 Other lack of coordination: Secondary | ICD-10-CM | POA: Diagnosis not present

## 2023-06-25 DIAGNOSIS — L97812 Non-pressure chronic ulcer of other part of right lower leg with fat layer exposed: Secondary | ICD-10-CM | POA: Diagnosis not present

## 2023-06-25 DIAGNOSIS — M1711 Unilateral primary osteoarthritis, right knee: Secondary | ICD-10-CM

## 2023-06-25 DIAGNOSIS — G8191 Hemiplegia, unspecified affecting right dominant side: Secondary | ICD-10-CM | POA: Diagnosis not present

## 2023-06-25 DIAGNOSIS — C61 Malignant neoplasm of prostate: Secondary | ICD-10-CM | POA: Diagnosis not present

## 2023-06-25 DIAGNOSIS — W1830XA Fall on same level, unspecified, initial encounter: Secondary | ICD-10-CM | POA: Diagnosis not present

## 2023-06-25 NOTE — Progress Notes (Signed)
Orthopedic Surgery Progress Note   Assessment: Patient is a 73 y.o. male with right knee pain which has essentially resolved   Plan: -No acute operative intervention -WBAT RLE -Continue with PT -Can use tylenol up to 1000mg  TID -Patient should follow up on an as needed basis  ___________________________________________________________________________  Subjective: Patient had a fall about 1 month ago. Noted acute onset of right knee pain. Went to the emergency department and was given outpatient orthopedic follow up. His knee pain has improved significantly since working with PT at his assisted living facility. He only ambulates with staff otherwise he is in a wheelchair.   Physical Exam:  General: no acute distress, appears stated age Neurologic: alert, answering questions appropriately, following commands Respiratory: unlabored breathing on room air, symmetric chest rise Psychiatric: appropriate affect, normal cadence to speech  MSK:   -Right lower extremity  Contracture and stiffness with knee extension. Range of motion from 5-100  No tenderness to palpation over the medial or lateral joint lines or the remainder of the knee  No gross deformity, no open wounds EHL/TA/FSC intact Sensation intact to light touch in sural, saphenous, tibial, deep peroneal, and superficial peroneal nerve distributions Foot warm and well perfused  XRs of the right knee from 05/28/2023 were independently reviewed and interpreted, showing no fracture or dislocation. Joint space narrowing laterally. No other significant degenerative changes seen.    Patient name: [name] Patient MRN: [id number] Date: 06/25/23

## 2023-06-26 DIAGNOSIS — G8191 Hemiplegia, unspecified affecting right dominant side: Secondary | ICD-10-CM | POA: Diagnosis not present

## 2023-06-26 DIAGNOSIS — W1830XA Fall on same level, unspecified, initial encounter: Secondary | ICD-10-CM | POA: Diagnosis not present

## 2023-06-26 DIAGNOSIS — M6281 Muscle weakness (generalized): Secondary | ICD-10-CM | POA: Diagnosis not present

## 2023-06-26 DIAGNOSIS — W1830XD Fall on same level, unspecified, subsequent encounter: Secondary | ICD-10-CM | POA: Diagnosis not present

## 2023-06-26 DIAGNOSIS — C61 Malignant neoplasm of prostate: Secondary | ICD-10-CM | POA: Diagnosis not present

## 2023-06-27 DIAGNOSIS — G8191 Hemiplegia, unspecified affecting right dominant side: Secondary | ICD-10-CM | POA: Diagnosis not present

## 2023-06-27 DIAGNOSIS — W1830XA Fall on same level, unspecified, initial encounter: Secondary | ICD-10-CM | POA: Diagnosis not present

## 2023-06-27 DIAGNOSIS — W1830XD Fall on same level, unspecified, subsequent encounter: Secondary | ICD-10-CM | POA: Diagnosis not present

## 2023-06-27 DIAGNOSIS — C61 Malignant neoplasm of prostate: Secondary | ICD-10-CM | POA: Diagnosis not present

## 2023-06-27 DIAGNOSIS — M6281 Muscle weakness (generalized): Secondary | ICD-10-CM | POA: Diagnosis not present

## 2023-06-30 DIAGNOSIS — N139 Obstructive and reflux uropathy, unspecified: Secondary | ICD-10-CM | POA: Diagnosis not present

## 2023-06-30 DIAGNOSIS — K219 Gastro-esophageal reflux disease without esophagitis: Secondary | ICD-10-CM | POA: Diagnosis not present

## 2023-06-30 DIAGNOSIS — C61 Malignant neoplasm of prostate: Secondary | ICD-10-CM | POA: Diagnosis not present

## 2023-06-30 DIAGNOSIS — I10 Essential (primary) hypertension: Secondary | ICD-10-CM | POA: Diagnosis not present

## 2023-06-30 DIAGNOSIS — I639 Cerebral infarction, unspecified: Secondary | ICD-10-CM | POA: Diagnosis not present

## 2023-07-01 DIAGNOSIS — I69320 Aphasia following cerebral infarction: Secondary | ICD-10-CM | POA: Diagnosis not present

## 2023-07-01 DIAGNOSIS — I739 Peripheral vascular disease, unspecified: Secondary | ICD-10-CM | POA: Diagnosis not present

## 2023-07-01 DIAGNOSIS — R278 Other lack of coordination: Secondary | ICD-10-CM | POA: Diagnosis not present

## 2023-07-01 DIAGNOSIS — L97812 Non-pressure chronic ulcer of other part of right lower leg with fat layer exposed: Secondary | ICD-10-CM | POA: Diagnosis not present

## 2023-07-01 DIAGNOSIS — G8191 Hemiplegia, unspecified affecting right dominant side: Secondary | ICD-10-CM | POA: Diagnosis not present

## 2023-07-01 DIAGNOSIS — E119 Type 2 diabetes mellitus without complications: Secondary | ICD-10-CM | POA: Diagnosis not present

## 2023-07-01 DIAGNOSIS — M6281 Muscle weakness (generalized): Secondary | ICD-10-CM | POA: Diagnosis not present

## 2023-07-02 DIAGNOSIS — W1830XD Fall on same level, unspecified, subsequent encounter: Secondary | ICD-10-CM | POA: Diagnosis not present

## 2023-07-02 DIAGNOSIS — G8191 Hemiplegia, unspecified affecting right dominant side: Secondary | ICD-10-CM | POA: Diagnosis not present

## 2023-07-02 DIAGNOSIS — C61 Malignant neoplasm of prostate: Secondary | ICD-10-CM | POA: Diagnosis not present

## 2023-07-02 DIAGNOSIS — W1830XA Fall on same level, unspecified, initial encounter: Secondary | ICD-10-CM | POA: Diagnosis not present

## 2023-07-02 DIAGNOSIS — M6281 Muscle weakness (generalized): Secondary | ICD-10-CM | POA: Diagnosis not present

## 2023-07-03 DIAGNOSIS — R338 Other retention of urine: Secondary | ICD-10-CM | POA: Diagnosis not present

## 2023-07-03 DIAGNOSIS — C61 Malignant neoplasm of prostate: Secondary | ICD-10-CM | POA: Diagnosis not present

## 2023-07-03 DIAGNOSIS — N401 Enlarged prostate with lower urinary tract symptoms: Secondary | ICD-10-CM | POA: Diagnosis not present

## 2023-07-05 DIAGNOSIS — M6281 Muscle weakness (generalized): Secondary | ICD-10-CM | POA: Diagnosis not present

## 2023-07-05 DIAGNOSIS — G8191 Hemiplegia, unspecified affecting right dominant side: Secondary | ICD-10-CM | POA: Diagnosis not present

## 2023-07-05 DIAGNOSIS — C61 Malignant neoplasm of prostate: Secondary | ICD-10-CM | POA: Diagnosis not present

## 2023-07-06 DIAGNOSIS — M6281 Muscle weakness (generalized): Secondary | ICD-10-CM | POA: Diagnosis not present

## 2023-07-06 DIAGNOSIS — C61 Malignant neoplasm of prostate: Secondary | ICD-10-CM | POA: Diagnosis not present

## 2023-07-06 DIAGNOSIS — G8191 Hemiplegia, unspecified affecting right dominant side: Secondary | ICD-10-CM | POA: Diagnosis not present

## 2023-07-07 DIAGNOSIS — L089 Local infection of the skin and subcutaneous tissue, unspecified: Secondary | ICD-10-CM | POA: Diagnosis not present

## 2023-07-07 DIAGNOSIS — M6281 Muscle weakness (generalized): Secondary | ICD-10-CM | POA: Diagnosis not present

## 2023-07-07 DIAGNOSIS — C61 Malignant neoplasm of prostate: Secondary | ICD-10-CM | POA: Diagnosis not present

## 2023-07-07 DIAGNOSIS — G8191 Hemiplegia, unspecified affecting right dominant side: Secondary | ICD-10-CM | POA: Diagnosis not present

## 2023-07-08 DIAGNOSIS — I739 Peripheral vascular disease, unspecified: Secondary | ICD-10-CM | POA: Diagnosis not present

## 2023-07-08 DIAGNOSIS — E119 Type 2 diabetes mellitus without complications: Secondary | ICD-10-CM | POA: Diagnosis not present

## 2023-07-08 DIAGNOSIS — I69320 Aphasia following cerebral infarction: Secondary | ICD-10-CM | POA: Diagnosis not present

## 2023-07-08 DIAGNOSIS — R278 Other lack of coordination: Secondary | ICD-10-CM | POA: Diagnosis not present

## 2023-07-08 DIAGNOSIS — L97812 Non-pressure chronic ulcer of other part of right lower leg with fat layer exposed: Secondary | ICD-10-CM | POA: Diagnosis not present

## 2023-07-08 DIAGNOSIS — G8191 Hemiplegia, unspecified affecting right dominant side: Secondary | ICD-10-CM | POA: Diagnosis not present

## 2023-07-08 DIAGNOSIS — C61 Malignant neoplasm of prostate: Secondary | ICD-10-CM | POA: Diagnosis not present

## 2023-07-08 DIAGNOSIS — M6281 Muscle weakness (generalized): Secondary | ICD-10-CM | POA: Diagnosis not present

## 2023-07-09 DIAGNOSIS — C61 Malignant neoplasm of prostate: Secondary | ICD-10-CM | POA: Diagnosis not present

## 2023-07-09 DIAGNOSIS — M6281 Muscle weakness (generalized): Secondary | ICD-10-CM | POA: Diagnosis not present

## 2023-07-09 DIAGNOSIS — G8191 Hemiplegia, unspecified affecting right dominant side: Secondary | ICD-10-CM | POA: Diagnosis not present

## 2023-07-10 DIAGNOSIS — C61 Malignant neoplasm of prostate: Secondary | ICD-10-CM | POA: Diagnosis not present

## 2023-07-10 DIAGNOSIS — M6281 Muscle weakness (generalized): Secondary | ICD-10-CM | POA: Diagnosis not present

## 2023-07-10 DIAGNOSIS — G8191 Hemiplegia, unspecified affecting right dominant side: Secondary | ICD-10-CM | POA: Diagnosis not present

## 2023-07-13 DIAGNOSIS — L0390 Cellulitis, unspecified: Secondary | ICD-10-CM | POA: Diagnosis not present

## 2023-07-14 DIAGNOSIS — I10 Essential (primary) hypertension: Secondary | ICD-10-CM | POA: Diagnosis not present

## 2023-07-14 DIAGNOSIS — F32A Depression, unspecified: Secondary | ICD-10-CM | POA: Diagnosis not present

## 2023-07-14 DIAGNOSIS — C61 Malignant neoplasm of prostate: Secondary | ICD-10-CM | POA: Diagnosis not present

## 2023-07-14 DIAGNOSIS — E118 Type 2 diabetes mellitus with unspecified complications: Secondary | ICD-10-CM | POA: Diagnosis not present

## 2023-07-14 DIAGNOSIS — R1311 Dysphagia, oral phase: Secondary | ICD-10-CM | POA: Diagnosis not present

## 2023-07-14 DIAGNOSIS — G8191 Hemiplegia, unspecified affecting right dominant side: Secondary | ICD-10-CM | POA: Diagnosis not present

## 2023-07-14 DIAGNOSIS — N139 Obstructive and reflux uropathy, unspecified: Secondary | ICD-10-CM | POA: Diagnosis not present

## 2023-07-14 DIAGNOSIS — R279 Unspecified lack of coordination: Secondary | ICD-10-CM | POA: Diagnosis not present

## 2023-07-14 DIAGNOSIS — I69320 Aphasia following cerebral infarction: Secondary | ICD-10-CM | POA: Diagnosis not present

## 2023-07-14 DIAGNOSIS — M6281 Muscle weakness (generalized): Secondary | ICD-10-CM | POA: Diagnosis not present

## 2023-07-14 DIAGNOSIS — I6930 Unspecified sequelae of cerebral infarction: Secondary | ICD-10-CM | POA: Diagnosis not present

## 2023-07-14 DIAGNOSIS — I739 Peripheral vascular disease, unspecified: Secondary | ICD-10-CM | POA: Diagnosis not present

## 2023-07-15 DIAGNOSIS — I739 Peripheral vascular disease, unspecified: Secondary | ICD-10-CM | POA: Diagnosis not present

## 2023-07-15 DIAGNOSIS — E119 Type 2 diabetes mellitus without complications: Secondary | ICD-10-CM | POA: Diagnosis not present

## 2023-07-15 DIAGNOSIS — M6281 Muscle weakness (generalized): Secondary | ICD-10-CM | POA: Diagnosis not present

## 2023-07-15 DIAGNOSIS — I69320 Aphasia following cerebral infarction: Secondary | ICD-10-CM | POA: Diagnosis not present

## 2023-07-15 DIAGNOSIS — L97812 Non-pressure chronic ulcer of other part of right lower leg with fat layer exposed: Secondary | ICD-10-CM | POA: Diagnosis not present

## 2023-07-15 DIAGNOSIS — C61 Malignant neoplasm of prostate: Secondary | ICD-10-CM | POA: Diagnosis not present

## 2023-07-15 DIAGNOSIS — G8191 Hemiplegia, unspecified affecting right dominant side: Secondary | ICD-10-CM | POA: Diagnosis not present

## 2023-07-15 DIAGNOSIS — R278 Other lack of coordination: Secondary | ICD-10-CM | POA: Diagnosis not present

## 2023-07-16 ENCOUNTER — Ambulatory Visit: Payer: Medicare HMO | Admitting: Family Medicine

## 2023-07-16 DIAGNOSIS — C61 Malignant neoplasm of prostate: Secondary | ICD-10-CM | POA: Diagnosis not present

## 2023-07-16 DIAGNOSIS — M6281 Muscle weakness (generalized): Secondary | ICD-10-CM | POA: Diagnosis not present

## 2023-07-16 DIAGNOSIS — G8191 Hemiplegia, unspecified affecting right dominant side: Secondary | ICD-10-CM | POA: Diagnosis not present

## 2023-07-17 DIAGNOSIS — C61 Malignant neoplasm of prostate: Secondary | ICD-10-CM | POA: Diagnosis not present

## 2023-07-17 DIAGNOSIS — M6281 Muscle weakness (generalized): Secondary | ICD-10-CM | POA: Diagnosis not present

## 2023-07-17 DIAGNOSIS — G8191 Hemiplegia, unspecified affecting right dominant side: Secondary | ICD-10-CM | POA: Diagnosis not present

## 2023-07-20 DIAGNOSIS — G8191 Hemiplegia, unspecified affecting right dominant side: Secondary | ICD-10-CM | POA: Diagnosis not present

## 2023-07-20 DIAGNOSIS — M6281 Muscle weakness (generalized): Secondary | ICD-10-CM | POA: Diagnosis not present

## 2023-07-20 DIAGNOSIS — C61 Malignant neoplasm of prostate: Secondary | ICD-10-CM | POA: Diagnosis not present

## 2023-07-21 DIAGNOSIS — C61 Malignant neoplasm of prostate: Secondary | ICD-10-CM | POA: Diagnosis not present

## 2023-07-21 DIAGNOSIS — M6281 Muscle weakness (generalized): Secondary | ICD-10-CM | POA: Diagnosis not present

## 2023-07-21 DIAGNOSIS — G8191 Hemiplegia, unspecified affecting right dominant side: Secondary | ICD-10-CM | POA: Diagnosis not present

## 2023-07-22 DIAGNOSIS — G8191 Hemiplegia, unspecified affecting right dominant side: Secondary | ICD-10-CM | POA: Diagnosis not present

## 2023-07-22 DIAGNOSIS — C61 Malignant neoplasm of prostate: Secondary | ICD-10-CM | POA: Diagnosis not present

## 2023-07-22 DIAGNOSIS — E119 Type 2 diabetes mellitus without complications: Secondary | ICD-10-CM | POA: Diagnosis not present

## 2023-07-22 DIAGNOSIS — R278 Other lack of coordination: Secondary | ICD-10-CM | POA: Diagnosis not present

## 2023-07-22 DIAGNOSIS — M6281 Muscle weakness (generalized): Secondary | ICD-10-CM | POA: Diagnosis not present

## 2023-07-22 DIAGNOSIS — I69320 Aphasia following cerebral infarction: Secondary | ICD-10-CM | POA: Diagnosis not present

## 2023-07-22 DIAGNOSIS — L97812 Non-pressure chronic ulcer of other part of right lower leg with fat layer exposed: Secondary | ICD-10-CM | POA: Diagnosis not present

## 2023-07-22 DIAGNOSIS — I739 Peripheral vascular disease, unspecified: Secondary | ICD-10-CM | POA: Diagnosis not present

## 2023-07-23 DIAGNOSIS — G8191 Hemiplegia, unspecified affecting right dominant side: Secondary | ICD-10-CM | POA: Diagnosis not present

## 2023-07-23 DIAGNOSIS — C61 Malignant neoplasm of prostate: Secondary | ICD-10-CM | POA: Diagnosis not present

## 2023-07-23 DIAGNOSIS — M6281 Muscle weakness (generalized): Secondary | ICD-10-CM | POA: Diagnosis not present

## 2023-07-24 DIAGNOSIS — M6281 Muscle weakness (generalized): Secondary | ICD-10-CM | POA: Diagnosis not present

## 2023-07-24 DIAGNOSIS — C61 Malignant neoplasm of prostate: Secondary | ICD-10-CM | POA: Diagnosis not present

## 2023-07-24 DIAGNOSIS — G8191 Hemiplegia, unspecified affecting right dominant side: Secondary | ICD-10-CM | POA: Diagnosis not present

## 2023-07-27 DIAGNOSIS — M6281 Muscle weakness (generalized): Secondary | ICD-10-CM | POA: Diagnosis not present

## 2023-07-27 DIAGNOSIS — C61 Malignant neoplasm of prostate: Secondary | ICD-10-CM | POA: Diagnosis not present

## 2023-07-27 DIAGNOSIS — L97215 Non-pressure chronic ulcer of right calf with muscle involvement without evidence of necrosis: Secondary | ICD-10-CM | POA: Diagnosis not present

## 2023-07-27 DIAGNOSIS — G8191 Hemiplegia, unspecified affecting right dominant side: Secondary | ICD-10-CM | POA: Diagnosis not present

## 2023-07-28 DIAGNOSIS — N139 Obstructive and reflux uropathy, unspecified: Secondary | ICD-10-CM | POA: Diagnosis not present

## 2023-07-28 DIAGNOSIS — I69320 Aphasia following cerebral infarction: Secondary | ICD-10-CM | POA: Diagnosis not present

## 2023-07-28 DIAGNOSIS — I739 Peripheral vascular disease, unspecified: Secondary | ICD-10-CM | POA: Diagnosis not present

## 2023-07-28 DIAGNOSIS — R279 Unspecified lack of coordination: Secondary | ICD-10-CM | POA: Diagnosis not present

## 2023-07-28 DIAGNOSIS — I6930 Unspecified sequelae of cerebral infarction: Secondary | ICD-10-CM | POA: Diagnosis not present

## 2023-07-28 DIAGNOSIS — E118 Type 2 diabetes mellitus with unspecified complications: Secondary | ICD-10-CM | POA: Diagnosis not present

## 2023-07-28 DIAGNOSIS — R1311 Dysphagia, oral phase: Secondary | ICD-10-CM | POA: Diagnosis not present

## 2023-07-28 DIAGNOSIS — F32A Depression, unspecified: Secondary | ICD-10-CM | POA: Diagnosis not present

## 2023-07-28 DIAGNOSIS — G8191 Hemiplegia, unspecified affecting right dominant side: Secondary | ICD-10-CM | POA: Diagnosis not present

## 2023-07-28 DIAGNOSIS — M6281 Muscle weakness (generalized): Secondary | ICD-10-CM | POA: Diagnosis not present

## 2023-07-28 DIAGNOSIS — I10 Essential (primary) hypertension: Secondary | ICD-10-CM | POA: Diagnosis not present

## 2023-07-28 DIAGNOSIS — C61 Malignant neoplasm of prostate: Secondary | ICD-10-CM | POA: Diagnosis not present

## 2023-07-29 DIAGNOSIS — M6281 Muscle weakness (generalized): Secondary | ICD-10-CM | POA: Diagnosis not present

## 2023-07-29 DIAGNOSIS — G8191 Hemiplegia, unspecified affecting right dominant side: Secondary | ICD-10-CM | POA: Diagnosis not present

## 2023-07-29 DIAGNOSIS — L97812 Non-pressure chronic ulcer of other part of right lower leg with fat layer exposed: Secondary | ICD-10-CM | POA: Diagnosis not present

## 2023-07-29 DIAGNOSIS — R278 Other lack of coordination: Secondary | ICD-10-CM | POA: Diagnosis not present

## 2023-07-29 DIAGNOSIS — I69320 Aphasia following cerebral infarction: Secondary | ICD-10-CM | POA: Diagnosis not present

## 2023-07-29 DIAGNOSIS — C61 Malignant neoplasm of prostate: Secondary | ICD-10-CM | POA: Diagnosis not present

## 2023-07-29 DIAGNOSIS — E119 Type 2 diabetes mellitus without complications: Secondary | ICD-10-CM | POA: Diagnosis not present

## 2023-07-29 DIAGNOSIS — I739 Peripheral vascular disease, unspecified: Secondary | ICD-10-CM | POA: Diagnosis not present

## 2023-07-30 DIAGNOSIS — M6281 Muscle weakness (generalized): Secondary | ICD-10-CM | POA: Diagnosis not present

## 2023-07-30 DIAGNOSIS — G8191 Hemiplegia, unspecified affecting right dominant side: Secondary | ICD-10-CM | POA: Diagnosis not present

## 2023-07-30 DIAGNOSIS — C61 Malignant neoplasm of prostate: Secondary | ICD-10-CM | POA: Diagnosis not present

## 2023-07-31 DIAGNOSIS — C61 Malignant neoplasm of prostate: Secondary | ICD-10-CM | POA: Diagnosis not present

## 2023-07-31 DIAGNOSIS — M6281 Muscle weakness (generalized): Secondary | ICD-10-CM | POA: Diagnosis not present

## 2023-07-31 DIAGNOSIS — R338 Other retention of urine: Secondary | ICD-10-CM | POA: Diagnosis not present

## 2023-07-31 DIAGNOSIS — G8191 Hemiplegia, unspecified affecting right dominant side: Secondary | ICD-10-CM | POA: Diagnosis not present

## 2023-08-03 DIAGNOSIS — G8929 Other chronic pain: Secondary | ICD-10-CM | POA: Diagnosis not present

## 2023-08-03 DIAGNOSIS — G8191 Hemiplegia, unspecified affecting right dominant side: Secondary | ICD-10-CM | POA: Diagnosis not present

## 2023-08-03 DIAGNOSIS — C61 Malignant neoplasm of prostate: Secondary | ICD-10-CM | POA: Diagnosis not present

## 2023-08-03 DIAGNOSIS — E1169 Type 2 diabetes mellitus with other specified complication: Secondary | ICD-10-CM | POA: Diagnosis not present

## 2023-08-03 DIAGNOSIS — E785 Hyperlipidemia, unspecified: Secondary | ICD-10-CM | POA: Diagnosis not present

## 2023-08-03 DIAGNOSIS — I639 Cerebral infarction, unspecified: Secondary | ICD-10-CM | POA: Diagnosis not present

## 2023-08-03 DIAGNOSIS — M6281 Muscle weakness (generalized): Secondary | ICD-10-CM | POA: Diagnosis not present

## 2023-08-04 DIAGNOSIS — C61 Malignant neoplasm of prostate: Secondary | ICD-10-CM | POA: Diagnosis not present

## 2023-08-04 DIAGNOSIS — G8191 Hemiplegia, unspecified affecting right dominant side: Secondary | ICD-10-CM | POA: Diagnosis not present

## 2023-08-04 DIAGNOSIS — M6281 Muscle weakness (generalized): Secondary | ICD-10-CM | POA: Diagnosis not present

## 2023-08-06 DIAGNOSIS — C61 Malignant neoplasm of prostate: Secondary | ICD-10-CM | POA: Diagnosis not present

## 2023-08-06 DIAGNOSIS — G8191 Hemiplegia, unspecified affecting right dominant side: Secondary | ICD-10-CM | POA: Diagnosis not present

## 2023-08-06 DIAGNOSIS — M6281 Muscle weakness (generalized): Secondary | ICD-10-CM | POA: Diagnosis not present

## 2023-08-07 DIAGNOSIS — C61 Malignant neoplasm of prostate: Secondary | ICD-10-CM | POA: Diagnosis not present

## 2023-08-07 DIAGNOSIS — G8191 Hemiplegia, unspecified affecting right dominant side: Secondary | ICD-10-CM | POA: Diagnosis not present

## 2023-08-07 DIAGNOSIS — M6281 Muscle weakness (generalized): Secondary | ICD-10-CM | POA: Diagnosis not present

## 2023-08-10 ENCOUNTER — Other Ambulatory Visit: Payer: Self-pay | Admitting: *Deleted

## 2023-08-10 DIAGNOSIS — I739 Peripheral vascular disease, unspecified: Secondary | ICD-10-CM

## 2023-08-10 DIAGNOSIS — G8191 Hemiplegia, unspecified affecting right dominant side: Secondary | ICD-10-CM | POA: Diagnosis not present

## 2023-08-10 DIAGNOSIS — C61 Malignant neoplasm of prostate: Secondary | ICD-10-CM | POA: Diagnosis not present

## 2023-08-10 DIAGNOSIS — M6281 Muscle weakness (generalized): Secondary | ICD-10-CM | POA: Diagnosis not present

## 2023-08-11 DIAGNOSIS — G8191 Hemiplegia, unspecified affecting right dominant side: Secondary | ICD-10-CM | POA: Diagnosis not present

## 2023-08-11 DIAGNOSIS — C61 Malignant neoplasm of prostate: Secondary | ICD-10-CM | POA: Diagnosis not present

## 2023-08-11 DIAGNOSIS — M6281 Muscle weakness (generalized): Secondary | ICD-10-CM | POA: Diagnosis not present

## 2023-08-12 DIAGNOSIS — E119 Type 2 diabetes mellitus without complications: Secondary | ICD-10-CM | POA: Diagnosis not present

## 2023-08-12 DIAGNOSIS — L97812 Non-pressure chronic ulcer of other part of right lower leg with fat layer exposed: Secondary | ICD-10-CM | POA: Diagnosis not present

## 2023-08-12 DIAGNOSIS — M6281 Muscle weakness (generalized): Secondary | ICD-10-CM | POA: Diagnosis not present

## 2023-08-12 DIAGNOSIS — I739 Peripheral vascular disease, unspecified: Secondary | ICD-10-CM | POA: Diagnosis not present

## 2023-08-12 DIAGNOSIS — G8191 Hemiplegia, unspecified affecting right dominant side: Secondary | ICD-10-CM | POA: Diagnosis not present

## 2023-08-12 DIAGNOSIS — C61 Malignant neoplasm of prostate: Secondary | ICD-10-CM | POA: Diagnosis not present

## 2023-08-12 DIAGNOSIS — R278 Other lack of coordination: Secondary | ICD-10-CM | POA: Diagnosis not present

## 2023-08-12 DIAGNOSIS — I69320 Aphasia following cerebral infarction: Secondary | ICD-10-CM | POA: Diagnosis not present

## 2023-08-12 NOTE — Progress Notes (Unsigned)
 Office Note     CC:  Nonhealing wound right lower extremity Requesting Provider:  Letitia Libra, NP  HPI: NICKALAUS CROOKE is a 73 y.o. (1950/11/19) male presenting at the request of .[name], MD for right lower extremity wounds and nonpalpable pulse.  On exam, wound was doing well, accompanied by his aide.  He lives in assisted living home status post stroke.  He continues to have no use of the right arm, but is able to weight-bear on the right leg.  He only ambulates with Occupational Therapy, but can stand and pivot.  [name] had wounds on the right lower extremity for months.  Healing has stagnated. Denies symptoms of claudication, ischemic rest pain. Known history of DVT, PAD  The pt is on a statin for cholesterol management.  The pt is not on a daily aspirin.   Other AC:  - The pt is is on medication for hypertension.   The pt is  diabetic.  Tobacco hx:  former  Past Medical History:  Diagnosis Date   CVA (cerebral infarction) 01/03/2008   MRI: Acute 1 x 1.5 cm infarction affecting the left side of the pons.   Diabetes mellitus    DVT (deep venous thrombosis) (HCC)    Hypertension    PAD (peripheral artery disease) (HCC)    Stroke (HCC)    2008    Past Surgical History:  Procedure Laterality Date   COLONOSCOPY     None      Social History   Socioeconomic History   Marital status: Divorced    Spouse name: Not on file   Number of children: 3   Years of education: 14   Highest education level: Not on file  Occupational History   Occupation: Disabled-security guard    Employer: [employer]  Tobacco Use   Smoking status: Never   Smokeless tobacco: Never  Substance and Sexual Activity   Alcohol use: No   Drug use: No   Sexual activity: Never    Partners: Female  Other Topics Concern   Not on file  Social History Narrative   Divorced, lives alone   2 sons, 1 daughter   Retired/disabled   No caffeine   12/24/2015   Social Drivers of Health    Financial Resource Strain: Not on file  Food Insecurity: Food Insecurity Present (03/25/2023)   Hunger Vital Sign    Worried About Running Out of Food in the Last Year: Sometimes true    Ran Out of Food in the Last Year: Sometimes true  Transportation Needs: No Transportation Needs (03/25/2023)   PRAPARE - Administrator, Civil Service (Medical): No    Lack of Transportation (Non-Medical): No  Physical Activity: Not on file  Stress: Not on file  Social Connections: Not on file  Intimate Partner Violence: Not At Risk (09/29/2022)   Humiliation, Afraid, Rape, and Kick questionnaire    Fear of Current or Ex-Partner: No    Emotionally Abused: No    Physically Abused: No    Sexually Abused: No   Family History  Problem Relation Age of Onset   Diabetes Mother    Heart attack Mother    Hypertension Mother    Diabetes Sister    Hypertension Sister    Stroke Sister        2017   Hypertension Brother    Hypertension Daughter    Hypertension Son    Colon cancer Neg Hx    Esophageal cancer  Neg Hx    Stomach cancer Neg Hx    Rectal cancer Neg Hx     Current Outpatient Medications  Medication Sig Dispense Refill   abiraterone acetate (ZYTIGA) 250 MG tablet Take 1,000 mg by mouth daily.     amlodipine-olmesartan (AZOR) 10-20 MG tablet TAKE 1 TABLET BY MOUTH EVERY DAY 90 tablet 1   atorvastatin (LIPITOR) 40 MG tablet Take 1 tablet (40 mg total) by mouth daily. 90 tablet 3   carvedilol (COREG) 12.5 MG tablet Take 1 tablet (12.5 mg total) by mouth 2 (two) times daily with a meal. 180 tablet 3   clopidogrel (PLAVIX) 75 MG tablet Take 1 tablet (75 mg total) by mouth daily. 90 tablet 2   insulin glargine (LANTUS SOLOSTAR) 100 UNIT/ML Solostar Pen Inject 12 Units into the skin daily. 15 mL 3   JARDIANCE 10 MG TABS tablet TAKE 1 TABLET BY MOUTH EVERY DAY 30 tablet 3   metFORMIN (GLUCOPHAGE) 1000 MG tablet Take 1 tablet (1,000 mg total) by mouth 2 (two) times daily with a meal.  180 tablet 3   pantoprazole (PROTONIX) 20 MG tablet TAKE 1 TABLET BY MOUTH EVERY DAY 90 tablet 1   predniSONE (DELTASONE) 5 MG tablet Take 5 mg by mouth daily.     sertraline (ZOLOFT) 100 MG tablet Take 1 tablet (100 mg total) by mouth daily. 90 tablet 2   tamsulosin (FLOMAX) 0.4 MG CAPS capsule Take 0.4 mg by mouth daily.     No current facility-administered medications for this visit.    Allergies  Allergen Reactions   Pork Allergy Other (See Comments)    Raises Blood pressure per report   Shrimp Flavor Agent (Non-Screening) Other (See Comments)    Doesn't like it     REVIEW OF SYSTEMS:  [X]  denotes positive finding, [ ]  denotes negative finding Cardiac  Comments:  Chest pain or chest pressure:    Shortness of breath upon exertion:    Short of breath when lying flat:    Irregular heart rhythm:        Vascular    Pain in calf, thigh, or hip brought on by ambulation:    Pain in feet at night that wakes you up from your sleep:     Blood clot in your veins:    Leg swelling:         Pulmonary    Oxygen at home:    Productive cough:     Wheezing:         Neurologic    Sudden weakness in arms or legs:     Sudden numbness in arms or legs:     Sudden onset of difficulty speaking or slurred speech:    Temporary loss of vision in one eye:     Problems with dizziness:         Gastrointestinal    Blood in stool:     Vomited blood:         Genitourinary    Burning when urinating:     Blood in urine:        Psychiatric    Major depression:         Hematologic    Bleeding problems:    Problems with blood clotting too easily:        Skin    Rashes or ulcers:        Constitutional    Fever or chills:      PHYSICAL EXAMINATION:  There were no vitals  filed for this visit.  General:  WDWN in NAD; vital signs documented above Gait: Not observed HENT: WNL, normocephalic Pulmonary: normal non-labored breathing , without wheezing Cardiac: regular HR Abdomen: soft,  NT, no masses Skin: without rashes Vascular Exam/Pulses:  Right Left  Radial 2+ (normal) 2+ (normal)  Ulnar    Femoral 2+ (normal) 2+ (normal)  Popliteal    DP absent absent  PT     Extremities: with ischemic changes, without Gangrene , without cellulitis; with open wounds;  Wounds of the right leg are mixed etiology, vascular and venous.  Leg swelling appreciated as well Musculoskeletal: no muscle wasting or atrophy  Neurologic: A&O X 3; slow speech, collecting thoughts status post stroke, right arm weakness, right leg with some weakness. Psychiatric:  The pt has Normal affect.   Non-Invasive Vascular Imaging:     ABI Findings:  +---------+------------------+-----+----------+--------+  Right   Rt Pressure (mmHg)IndexWaveform  Comment   +---------+------------------+-----+----------+--------+  Brachial 208                                        +---------+------------------+-----+----------+--------+  PTA                            monophasic          +---------+------------------+-----+----------+--------+  DP                             monophasic          +---------+------------------+-----+----------+--------+  Great Toe31                0.15                     +---------+------------------+-----+----------+--------+   +---------+------------------+-----+----------+-------+  Left    Lt Pressure (mmHg)IndexWaveform  Comment  +---------+------------------+-----+----------+-------+  Brachial 193                                       +---------+------------------+-----+----------+-------+  PTA                            monophasic         +---------+------------------+-----+----------+-------+  DP                             monophasic         +---------+------------------+-----+----------+-------+  Great Toe48                0.23                    +---------+------------------+-----+----------+-------+    +-------+-----------+-----------+------------+------------+  ABI/TBIToday's ABIToday's TBIPrevious ABIPrevious TBI  +-------+-----------+-----------+------------+------------+  Right [place]         0.15                                 +-------+-----------+-----------+------------+------------+  Left  [place]         0.23                                 +-------+-----------+-----------+------------+------------+  ASSESSMENT/PLAN: BABY STAIRS is a 73 y.o. male presenting with chronic, nonhealing, mixed arteriovenous wounds on the right lower extremity.  On physical exam, Lilly had palpable femoral pulses, nonpalpable pulses in the feet.  Wounds ranged from the calf, to onto the dorsum of the foot.  These were dry.  Really has significant swelling, likely sequela from previous DVT and sitting in a dependent position for the majority of the day as he is relatively nonambulatory.  ABI was reviewed demonstrating severe peripheral arterial disease bilaterally.  With his level of perfusion, I am not surprised that these wounds have not healed.  Ivar Drape and I had a long discussion regarding the above, most notably that lower extremity perfusion needs to be improved in an effort to heal these wounds.  Without this, he will likely require amputation.  With his current ambulatory status the only amputation I would offer would be above-knee amputation.  After discussed the risks of bilateral extremity angiography with emphasis on the right due to critical limb ischemia with tissue loss.  Will elected to proceed.  I think that this will demonstrate multilevel occlusive disease involving the superficial femoral artery, popliteal artery, tibial vessels and pedal vessels.  He is aware that while the superficial femoral, popliteal and tibial vessels can be intervened upon, pedal destruction from longstanding diabetes is not treatable.  We discussed the importance of elevation when able.  I  would like to hold off on compression at this time due to his poor perfusion. I have started 81 milligrams aspirin daily  I will call his daughter later today to discuss this care plan.   Victorino Sparrow, MD [facility] [phone]

## 2023-08-13 ENCOUNTER — Ambulatory Visit (INDEPENDENT_AMBULATORY_CARE_PROVIDER_SITE_OTHER): Admitting: Vascular Surgery

## 2023-08-13 ENCOUNTER — Encounter: Payer: Self-pay | Admitting: Vascular Surgery

## 2023-08-13 ENCOUNTER — Ambulatory Visit (HOSPITAL_COMMUNITY)
Admission: RE | Admit: 2023-08-13 | Discharge: 2023-08-13 | Disposition: A | Discharge status: Home / Self Care | Source: Ambulatory Visit | Attending: Vascular Surgery | Admitting: Vascular Surgery

## 2023-08-13 ENCOUNTER — Other Ambulatory Visit: Payer: Self-pay

## 2023-08-13 VITALS — BP 193/80 | HR 62 | Temp 98.7°F | Ht 67.0 in

## 2023-08-13 DIAGNOSIS — G8191 Hemiplegia, unspecified affecting right dominant side: Secondary | ICD-10-CM | POA: Diagnosis not present

## 2023-08-13 DIAGNOSIS — I70245 Atherosclerosis of native arteries of left leg with ulceration of other part of foot: Secondary | ICD-10-CM

## 2023-08-13 DIAGNOSIS — I739 Peripheral vascular disease, unspecified: Secondary | ICD-10-CM | POA: Insufficient documentation

## 2023-08-13 DIAGNOSIS — M6281 Muscle weakness (generalized): Secondary | ICD-10-CM | POA: Diagnosis not present

## 2023-08-13 DIAGNOSIS — C61 Malignant neoplasm of prostate: Secondary | ICD-10-CM | POA: Diagnosis not present

## 2023-08-13 LAB — VAS US ABI WITH/WO TBI

## 2023-08-13 MED ORDER — ASPIRIN 81 MG PO TBEC: 81.0000 mg | DELAYED_RELEASE_TABLET | Freq: Every day | ORAL | 12 refills | Status: AC

## 2023-08-14 DIAGNOSIS — G8191 Hemiplegia, unspecified affecting right dominant side: Secondary | ICD-10-CM | POA: Diagnosis not present

## 2023-08-14 DIAGNOSIS — C61 Malignant neoplasm of prostate: Secondary | ICD-10-CM | POA: Diagnosis not present

## 2023-08-14 DIAGNOSIS — M6281 Muscle weakness (generalized): Secondary | ICD-10-CM | POA: Diagnosis not present

## 2023-08-18 DIAGNOSIS — C61 Malignant neoplasm of prostate: Secondary | ICD-10-CM | POA: Diagnosis not present

## 2023-08-18 DIAGNOSIS — G8191 Hemiplegia, unspecified affecting right dominant side: Secondary | ICD-10-CM | POA: Diagnosis not present

## 2023-08-18 DIAGNOSIS — M6281 Muscle weakness (generalized): Secondary | ICD-10-CM | POA: Diagnosis not present

## 2023-08-19 ENCOUNTER — Ambulatory Visit (HOSPITAL_COMMUNITY)
Admission: RE | Admit: 2023-08-19 | Discharge: 2023-08-19 | Disposition: A | Discharge status: Home / Self Care | Attending: Vascular Surgery | Admitting: Vascular Surgery

## 2023-08-19 ENCOUNTER — Encounter (HOSPITAL_COMMUNITY)
Admission: RE | Disposition: A | Discharge status: Home / Self Care | Payer: Self-pay | Source: Home / Self Care | Attending: Vascular Surgery

## 2023-08-19 ENCOUNTER — Other Ambulatory Visit: Payer: Self-pay

## 2023-08-19 DIAGNOSIS — I10 Essential (primary) hypertension: Secondary | ICD-10-CM | POA: Diagnosis not present

## 2023-08-19 DIAGNOSIS — Z87891 Personal history of nicotine dependence: Secondary | ICD-10-CM | POA: Insufficient documentation

## 2023-08-19 DIAGNOSIS — Z7982 Long term (current) use of aspirin: Secondary | ICD-10-CM | POA: Insufficient documentation

## 2023-08-19 DIAGNOSIS — Z8673 Personal history of transient ischemic attack (TIA), and cerebral infarction without residual deficits: Secondary | ICD-10-CM | POA: Insufficient documentation

## 2023-08-19 DIAGNOSIS — Z833 Family history of diabetes mellitus: Secondary | ICD-10-CM | POA: Insufficient documentation

## 2023-08-19 DIAGNOSIS — I70238 Atherosclerosis of native arteries of right leg with ulceration of other part of lower right leg: Secondary | ICD-10-CM | POA: Diagnosis present

## 2023-08-19 DIAGNOSIS — Z86718 Personal history of other venous thrombosis and embolism: Secondary | ICD-10-CM | POA: Diagnosis not present

## 2023-08-19 DIAGNOSIS — Z602 Problems related to living alone: Secondary | ICD-10-CM | POA: Insufficient documentation

## 2023-08-19 DIAGNOSIS — I70235 Atherosclerosis of native arteries of right leg with ulceration of other part of foot: Secondary | ICD-10-CM

## 2023-08-19 DIAGNOSIS — Z8249 Family history of ischemic heart disease and other diseases of the circulatory system: Secondary | ICD-10-CM | POA: Diagnosis not present

## 2023-08-19 DIAGNOSIS — L97819 Non-pressure chronic ulcer of other part of right lower leg with unspecified severity: Secondary | ICD-10-CM | POA: Diagnosis not present

## 2023-08-19 DIAGNOSIS — E1151 Type 2 diabetes mellitus with diabetic peripheral angiopathy without gangrene: Secondary | ICD-10-CM | POA: Diagnosis not present

## 2023-08-19 DIAGNOSIS — E11621 Type 2 diabetes mellitus with foot ulcer: Secondary | ICD-10-CM | POA: Insufficient documentation

## 2023-08-19 DIAGNOSIS — I70245 Atherosclerosis of native arteries of left leg with ulceration of other part of foot: Secondary | ICD-10-CM

## 2023-08-19 DIAGNOSIS — Z823 Family history of stroke: Secondary | ICD-10-CM | POA: Insufficient documentation

## 2023-08-19 HISTORY — PX: LOWER EXTREMITY INTERVENTION: CATH118252

## 2023-08-19 HISTORY — PX: ABDOMINAL AORTOGRAM W/LOWER EXTREMITY: CATH118223

## 2023-08-19 HISTORY — PX: LOWER EXTREMITY ANGIOGRAPHY: CATH118251

## 2023-08-19 LAB — POCT I-STAT, CHEM 8
BUN: 22 mg/dL (ref 8–23)
Calcium, Ion: 1.2 mmol/L (ref 1.15–1.40)
Chloride: 106 mmol/L (ref 98–111)
Creatinine, Ser: 1 mg/dL (ref 0.61–1.24)
Glucose, Bld: 193 mg/dL — ABNORMAL HIGH (ref 70–99)
HCT: 37 % — ABNORMAL LOW (ref 39.0–52.0)
Hemoglobin: 12.6 g/dL — ABNORMAL LOW (ref 13.0–17.0)
Potassium: 4.3 mmol/L (ref 3.5–5.1)
Sodium: 139 mmol/L (ref 135–145)
TCO2: 23 mmol/L (ref 22–32)

## 2023-08-19 LAB — GLUCOSE, CAPILLARY: Glucose-Capillary: 152 mg/dL — ABNORMAL HIGH (ref 70–99)

## 2023-08-19 SURGERY — ABDOMINAL AORTOGRAM W/LOWER EXTREMITY
Anesthesia: LOCAL | Laterality: Right

## 2023-08-19 MED ORDER — LIDOCAINE HCL (PF) 1 % IJ SOLN
INTRAMUSCULAR | Status: DC | PRN
  Administered 2023-08-19: 10 mL

## 2023-08-19 MED ORDER — SODIUM CHLORIDE 0.9 % IV SOLN: INTRAVENOUS | Status: DC

## 2023-08-19 MED ORDER — SODIUM CHLORIDE 0.9 % IV SOLN: 250.0000 mL | INTRAVENOUS | Status: DC | PRN

## 2023-08-19 MED ORDER — HYDRALAZINE HCL 20 MG/ML IJ SOLN
INTRAMUSCULAR | Status: AC
  Filled 2023-08-19: qty 1

## 2023-08-19 MED ORDER — LIDOCAINE HCL (PF) 1 % IJ SOLN
INTRAMUSCULAR | Status: AC
  Filled 2023-08-19: qty 30

## 2023-08-19 MED ORDER — SODIUM CHLORIDE 0.9% FLUSH: 3.0000 mL | Freq: Two times a day (BID) | INTRAVENOUS | Status: DC

## 2023-08-19 MED ORDER — CLOPIDOGREL BISULFATE 75 MG PO TABS
ORAL_TABLET | ORAL | Status: AC
  Filled 2023-08-19: qty 1

## 2023-08-19 MED ORDER — HEPARIN SODIUM (PORCINE) 1000 UNIT/ML IJ SOLN
INTRAMUSCULAR | Status: DC | PRN
  Administered 2023-08-19: 2000 [IU] via INTRAVENOUS
  Administered 2023-08-19: 8000 [IU] via INTRAVENOUS

## 2023-08-19 MED ORDER — SODIUM CHLORIDE 0.9 % WEIGHT BASED INFUSION: 1.0000 mL/kg/h | INTRAVENOUS | Status: DC

## 2023-08-19 MED ORDER — LIDOCAINE-EPINEPHRINE 1 %-1:100000 IJ SOLN
INTRAMUSCULAR | Status: AC
  Administered 2023-08-19: 6 mL
  Filled 2023-08-19: qty 1

## 2023-08-19 MED ORDER — ASPIRIN 81 MG PO CHEW
CHEWABLE_TABLET | ORAL | Status: DC | PRN
  Administered 2023-08-19: 81 mg via ORAL

## 2023-08-19 MED ORDER — ONDANSETRON HCL 4 MG/2ML IJ SOLN: 4.0000 mg | Freq: Four times a day (QID) | INTRAMUSCULAR | Status: DC | PRN

## 2023-08-19 MED ORDER — SODIUM CHLORIDE 0.9% FLUSH: 3.0000 mL | INTRAVENOUS | Status: DC | PRN

## 2023-08-19 MED ORDER — HEPARIN (PORCINE) IN NACL 1000-0.9 UT/500ML-% IV SOLN
INTRAVENOUS | Status: DC | PRN
  Administered 2023-08-19 (×2): 500 mL

## 2023-08-19 MED ORDER — IODIXANOL 320 MG/ML IV SOLN
INTRAVENOUS | Status: DC | PRN
  Administered 2023-08-19: 105 mL

## 2023-08-19 MED ORDER — HYDRALAZINE HCL 20 MG/ML IJ SOLN
INTRAMUSCULAR | Status: DC | PRN
  Administered 2023-08-19 (×3): 10 mg via INTRAVENOUS

## 2023-08-19 MED ORDER — LABETALOL HCL 5 MG/ML IV SOLN: 10.0000 mg | INTRAVENOUS | Status: DC | PRN

## 2023-08-19 MED ORDER — ACETAMINOPHEN 325 MG PO TABS: 650.0000 mg | ORAL_TABLET | ORAL | Status: DC | PRN

## 2023-08-19 MED ORDER — HYDRALAZINE HCL 20 MG/ML IJ SOLN: 5.0000 mg | INTRAMUSCULAR | Status: DC | PRN

## 2023-08-19 MED ORDER — ASPIRIN 81 MG PO CHEW
CHEWABLE_TABLET | ORAL | Status: AC
  Filled 2023-08-19: qty 1

## 2023-08-19 MED ORDER — CLOPIDOGREL BISULFATE 300 MG PO TABS
ORAL_TABLET | ORAL | Status: DC | PRN
  Administered 2023-08-19: 75 mg via ORAL

## 2023-08-19 SURGICAL SUPPLY — 22 items
BALLN IN.PACT DCB 5X60 (BALLOONS) ×1 IMPLANT
BALLN MUSTANG 5X80X135 (BALLOONS) ×1 IMPLANT
BALLN STERLING OTW 2.5X150X150 (BALLOONS) ×1 IMPLANT
BALLOON MUSTANG 5X80X135 (BALLOONS) IMPLANT
BALLOON STRLNG OTW 2.5X150X150 (BALLOONS) IMPLANT
CATH OMNI FLUSH 5F 65CM (CATHETERS) IMPLANT
CATH QUICKCROSS SUPP .035X90CM (MICROCATHETER) IMPLANT
COVER DOME SNAP 22 D (MISCELLANEOUS) IMPLANT
DCB IN.PACT 5X60 (BALLOONS) IMPLANT
DEVICE CLOSURE MYNXGRIP 6/7F (Vascular Products) IMPLANT
GLIDEWIRE ADV .035X260CM (WIRE) IMPLANT
KIT ENCORE 26 ADVANTAGE (KITS) IMPLANT
KIT MICROPUNCTURE NIT STIFF (SHEATH) IMPLANT
SET ATX-X65L (MISCELLANEOUS) IMPLANT
SHEATH CATAPULT 6FR 45 (SHEATH) IMPLANT
SHEATH PINNACLE 5F 10CM (SHEATH) IMPLANT
SHEATH PINNACLE 6F 10CM (SHEATH) IMPLANT
SHEATH PROBE COVER 6X72 (BAG) IMPLANT
STENT ELUVIA 6X80X130 (Permanent Stent) IMPLANT
TRAY PV CATH (CUSTOM PROCEDURE TRAY) IMPLANT
WIRE BENTSON .035X145CM (WIRE) IMPLANT
WIRE G V18X300CM (WIRE) IMPLANT

## 2023-08-19 NOTE — Op Note (Signed)
 Patient name: [name] MRN: [id number] DOB: 1950/09/22 Sex: male  08/19/2023 Pre-operative Diagnosis: Right lower extremity critical limb ischemia with tissue loss in the foot Post-operative diagnosis:  Same Surgeon:  Victorino Sparrow, MD Procedure Performed: 1.  Ultrasound-guided micropuncture access of the left common femoral artery in retrograde fashion 2.  Aortogram 3.  Second-order cannulation, right lower extremity angiogram 4.  Third order cannulation, right lower extremity angiogram 5.  Selective angiography, popliteal artery 6.  5 x 80 balloon angioplasty superficial femoral artery 7.  5 x 60 drug-coated balloon angioplasty superficial femoral artery 8.  6 x 80 drug-eluting stenting superficial femoral artery 2.5 x 150 mm balloon angioplasty anterior tibial artery Contrast volume 105 mL   Indications: Patient is a 73 year old male who presented to my office with right lower extremity critical limb ischemia with tissue loss at the foot.  He is nonambulatory, and heavy 2 person assist, but can stand and pivot with assistance.  We discussed right lower extremity angiography in an effort to define and improve distal perfusion for wound healing.  I do not think he is an operative candidate at this time, therefore will be aggressive when it comes to endovascular therapies.  Both he and his daughter are aware of this.  Findings:  Aortogram: Superior mesenteric artery widely patent, bilateral renal arteries widely patent.  No flow-limiting stenosis in aortoiliac segments bilaterally.  On the right: Widely patent common femoral artery.  Significant atherosclerotic disease appreciated in the profunda, but widely patent.  The superficial femoral artery has mild to moderate disease with near occlusion at the mid SFA prior to Hunter's canal.  This lesion is greater than 99%.  The entirety of the artery is diseased.  This continues into the popliteal artery.  Multiple areas of stenosis,  however nonflow-limiting.  There is single-vessel anterior tibial artery outflow to the foot with severe atherosclerotic disease, and multiple greater than 80% stenotic lesions throughout its course.  The foot fills from the dorsalis pedis and retrograde fills plantar arteries.  The peroneal artery is occluded.  The posterior tibial artery is atretic, fills very slowly and occludes at the mid tibia.   Procedure:  The patient was identified in the holding area and taken to room 8.  The patient was then placed supine on the table and prepped and draped in the usual sterile fashion.  A time out was called.  Ultrasound was used to evaluate the left common femoral artery.  It was patent .  A digital ultrasound image was acquired.  A micropuncture needle was used to access the left common femoral artery under ultrasound guidance.  An 018 wire was advanced without resistance and a micropuncture sheath was placed.  The 018 wire was removed and a benson wire was placed.  The micropuncture sheath was exchanged for a 5 french sheath.  An omniflush catheter was advanced over the wire to the level of L-1.  An abdominal angiogram was obtained.  Next, using the omniflush catheter and a benson wire, the aortic bifurcation was crossed and the catheter was placed into theright external iliac artery and right runoff was obtained.    See results above.  I elected to intervene on the superficial femoral artery.  A 6 French by 45 cm catheter was parked in the proximal superficial femoral artery, and the patient was heparinized.  Imaging followed from the superficial femoral artery next, a series of wires and catheters were used to cross the stenotic lesion at  the mid SFA.  The wire was carried down into the popliteal artery.  I ensured I was true lumen via an angiogram from the catheter and the popliteal artery.  I was.  Next, I attempted balloon angioplasty of the lesion with a 5 x 80 mm balloon.  I was happy with this result,  however there was some residual stenosis.  I elected to move forward with drug-coated balloon angioplasty.  This was inflated to nominal profile for 3 minutes.  Follow-up angiography demonstrated improvement, however the lesion was still present.  I elected to move to drug-eluting stenting of the lesion using a 6 x 80 mm Eluvia stent.  This was laid across the lesion and deployed, postdilated with a 5 x 80 mm balloon.  This demonstrated excellent result.  Next, admitted to the anterior tibial artery.  The 0.035 wire was exchanged for a 0.018 wire and the anterior tibial artery was cannulated.  The wire was run into the dorsalis pedis artery.  Over this wire, a 2.5 x 150 mm balloon was brought onto the field and 250 mm of the anterior tibial artery was balloon angioplasty due to the multiple, flow-limiting lesions.  This took multiple balloon inflations that were held for 3 minutes each.  Follow-up angiography demonstrated excellent result with brisk, single-vessel runoff to the foot.  The posterior tibial artery did not fill in the foot, therefore I did not attempt recanalization of the posterior tibial artery.  The plantar arteries filled retrograde.   I was happy with this result, therefore the left sided access was closed with the use of a minx device without issue. All of this was done without sedation due to the patient's poor health.   Impression: Successful resolution of superficial femoral artery stenosis using a 6 x 80 drug-eluting stent, successful resolution of anterior tibial artery stenosis using a 2.5 x 150 mm balloon  Patient has been optimally revascularized.  Needs to keep pressure off of the areas with wounds     Victorino Sparrow MD [facility] Office: [phone]

## 2023-08-19 NOTE — H&P (Signed)
 Office Note   Patient seen and examined in preop holding.  No complaints. No changes to medication history or physical exam since last seen in clinic. After discussing the risks and benefits of RLE angiogram for CLI 5, [name] elected to proceed.   Victorino Sparrow MD   CC:  Nonhealing wound right lower extremity Requesting Provider:  No ref. provider found  HPI: [name] is a 73 y.o. ([birth year]) male presenting at the request of .Kyle Bender, MD for right lower extremity wounds and nonpalpable pulse.  On exam, wound was doing well, accompanied by his aide.  He lives in assisted living home status post stroke.  He continues to have no use of the right arm, but is able to weight-bear on the right leg.  He only ambulates with Occupational Therapy, but can stand and pivot.  [name] had wounds on the right lower extremity for months.  Healing has stagnated. Denies symptoms of claudication, ischemic rest pain. Known history of DVT, PAD  The pt is on a statin for cholesterol management.  The pt is not on a daily aspirin.   Other AC:  - The pt is is on medication for hypertension.   The pt is  diabetic.  Tobacco hx:  former  Past Medical History:  Diagnosis Date   CVA (cerebral infarction) 01/03/2008   MRI: Acute 1 x 1.5 cm infarction affecting the left side of the pons.   Diabetes mellitus    DVT (deep venous thrombosis) (HCC)    Hypertension    PAD (peripheral artery disease) (HCC)    Stroke (HCC)    2008    Past Surgical History:  Procedure Laterality Date   COLONOSCOPY     None      Social History   Socioeconomic History   Marital status: Divorced    Spouse name: Not on file   Number of children: 3   Years of education: 14   Highest education level: Not on file  Occupational History   Occupation: Disabled-security guard    Employer: [employer]  Tobacco Use   Smoking status: Never   Smokeless tobacco: Never  Substance and Sexual Activity    Alcohol use: No   Drug use: No   Sexual activity: Never    Partners: Female  Other Topics Concern   Not on file  Social History Narrative   Divorced, lives alone   2 sons, 1 daughter   Retired/disabled   No caffeine   12/24/2015   Social Drivers of Health   Financial Resource Strain: Not on file  Food Insecurity: Food Insecurity Present (03/25/2023)   Hunger Vital Sign    Worried About Running Out of Food in the Last Year: Sometimes true    Ran Out of Food in the Last Year: Sometimes true  Transportation Needs: No Transportation Needs (03/25/2023)   PRAPARE - Administrator, Civil Service (Medical): No    Lack of Transportation (Non-Medical): No  Physical Activity: Not on file  Stress: Not on file  Social Connections: Not on file  Intimate Partner Violence: Not At Risk (09/29/2022)   Humiliation, Afraid, Rape, and Kick questionnaire    Fear of Current or Ex-Partner: No    Emotionally Abused: No    Physically Abused: No    Sexually Abused: No   Family History  Problem Relation Age of Onset   Diabetes Mother    Heart attack Mother    Hypertension Mother  Diabetes Sister    Hypertension Sister    Stroke Sister        2017   Hypertension Brother    Hypertension Daughter    Hypertension Son    Colon cancer Neg Hx    Esophageal cancer Neg Hx    Stomach cancer Neg Hx    Rectal cancer Neg Hx     Current Facility-Administered Medications  Medication Dose Route Frequency Provider Last Rate Last Admin   0.9 %  sodium chloride infusion   Intravenous Continuous Victorino Sparrow, MD 100 mL/hr at 08/19/23 0802 New Bag at 08/19/23 0802   0.9 %  sodium chloride infusion  250 mL Intravenous PRN Victorino Sparrow, MD       0.9% sodium chloride infusion  1 mL/kg/hr Intravenous Continuous Victorino Sparrow, MD       acetaminophen (TYLENOL) tablet 650 mg  650 mg Oral Q4H PRN Victorino Sparrow, MD       aspirin chewable tablet    PRN Victorino Sparrow, MD   81 mg at  08/19/23 0955   clopidogrel (PLAVIX) tablet    PRN Victorino Sparrow, MD   75 mg at 08/19/23 0956   Heparin (Porcine) in NaCl 1000-0.9 UT/500ML-% SOLN    PRN Victorino Sparrow, MD   500 mL at 08/19/23 2841   heparin sodium (porcine) injection    PRN Victorino Sparrow, MD   2,000 Units at 08/19/23 3244   hydrALAZINE (APRESOLINE) injection 5 mg  5 mg Intravenous Q20 Min PRN Victorino Sparrow, MD       hydrALAZINE (APRESOLINE) injection    PRN Victorino Sparrow, MD   10 mg at 08/19/23 0951   iodixanol (VISIPAQUE) 320 MG/ML injection    PRN Victorino Sparrow, MD   105 mL at 08/19/23 0953   labetalol (NORMODYNE) injection 10 mg  10 mg Intravenous Q10 min PRN Victorino Sparrow, MD       lidocaine (PF) (XYLOCAINE) 1 % injection    PRN Victorino Sparrow, MD   10 mL at 08/19/23 0853   ondansetron (ZOFRAN) injection 4 mg  4 mg Intravenous Q6H PRN Victorino Sparrow, MD       sodium chloride flush (NS) 0.9 % injection 3 mL  3 mL Intravenous Q12H Victorino Sparrow, MD       sodium chloride flush (NS) 0.9 % injection 3 mL  3 mL Intravenous PRN Victorino Sparrow, MD        Allergies  Allergen Reactions   Pork Allergy Other (See Comments)    Raises Blood pressure per report   Shrimp Flavor Agent (Non-Screening) Other (See Comments)    Doesn't like it     REVIEW OF SYSTEMS:  [X]  denotes positive finding, [ ]  denotes negative finding Cardiac  Comments:  Chest pain or chest pressure:    Shortness of breath upon exertion:    Short of breath when lying flat:    Irregular heart rhythm:        Vascular    Pain in calf, thigh, or hip brought on by ambulation:    Pain in feet at night that wakes you up from your sleep:     Blood clot in your veins:    Leg swelling:         Pulmonary    Oxygen at home:    Productive cough:     Wheezing:         Neurologic  Sudden weakness in arms or legs:     Sudden numbness in arms or legs:     Sudden onset of difficulty speaking or slurred speech:    Temporary loss of  vision in one eye:     Problems with dizziness:         Gastrointestinal    Blood in stool:     Vomited blood:         Genitourinary    Burning when urinating:     Blood in urine:        Psychiatric    Major depression:         Hematologic    Bleeding problems:    Problems with blood clotting too easily:        Skin    Rashes or ulcers:        Constitutional    Fever or chills:      PHYSICAL EXAMINATION:  Vitals:   08/19/23 0709 08/19/23 0852 08/19/23 1018 08/19/23 1019  BP: (!) 196/81  115/60 (!) 130/55  Pulse: (!) 55   72  Resp: 16     Temp: 98.3 F (36.8 C)     TempSrc: Oral     SpO2: 99% 100%  98%  Weight: 91.6 kg     Height: 5\' 7"  (1.702 m)       General:  WDWN in NAD; vital signs documented above Gait: Not observed HENT: WNL, normocephalic Pulmonary: normal non-labored breathing , without wheezing Cardiac: regular HR Abdomen: soft, NT, no masses Skin: without rashes Vascular Exam/Pulses:  Right Left  Radial 2+ (normal) 2+ (normal)  Ulnar    Femoral 2+ (normal) 2+ (normal)  Popliteal    DP absent absent  PT     Extremities: with ischemic changes, without Gangrene , without cellulitis; with open wounds;  Wounds of the right leg are mixed etiology, vascular and venous.  Leg swelling appreciated as well Musculoskeletal: no muscle wasting or atrophy  Neurologic: A&O X 3; slow speech, collecting thoughts status post stroke, right arm weakness, right leg with some weakness. Psychiatric:  The pt has Normal affect.   Non-Invasive Vascular Imaging:     ABI Findings:  +---------+------------------+-----+----------+--------+  Right   Rt Pressure (mmHg)IndexWaveform  Comment   +---------+------------------+-----+----------+--------+  Brachial 208                                        +---------+------------------+-----+----------+--------+  PTA                            monophasic           +---------+------------------+-----+----------+--------+  DP                             monophasic          +---------+------------------+-----+----------+--------+  Great Toe31                0.15                     +---------+------------------+-----+----------+--------+   +---------+------------------+-----+----------+-------+  Left    Lt Pressure (mmHg)IndexWaveform  Comment  +---------+------------------+-----+----------+-------+  Brachial 193                                       +---------+------------------+-----+----------+-------+  PTA                            monophasic         +---------+------------------+-----+----------+-------+  DP                             monophasic         +---------+------------------+-----+----------+-------+  Great Toe48                0.23                    +---------+------------------+-----+----------+-------+   +-------+-----------+-----------+------------+------------+  ABI/TBIToday's ABIToday's TBIPrevious ABIPrevious TBI  +-------+-----------+-----------+------------+------------+  Right Blue Eye         0.15                                 +-------+-----------+-----------+------------+------------+  Left  Mila Doce         0.23                                 +-------+-----------+-----------+------------+------------+      ASSESSMENT/PLAN: CRISTOFHER LIVECCHI is a 73 y.o. male presenting with chronic, nonhealing, mixed arteriovenous wounds on the right lower extremity.  On physical exam, Lilly had palpable femoral pulses, nonpalpable pulses in the feet.  Wounds ranged from the calf, to onto the dorsum of the foot.  These were dry.  Really has significant swelling, likely sequela from previous DVT and sitting in a dependent position for the majority of the day as he is relatively nonambulatory.  ABI was reviewed demonstrating severe peripheral arterial disease bilaterally.  With his  level of perfusion, I am not surprised that these wounds have not healed.  Ivar Drape and I had a long discussion regarding the above, most notably that lower extremity perfusion needs to be improved in an effort to heal these wounds.  Without this, he will likely require amputation.  With his current ambulatory status the only amputation I would offer would be above-knee amputation.  After discussed the risks of bilateral extremity angiography with emphasis on the right due to critical limb ischemia with tissue loss.  Will elected to proceed.  I think that this will demonstrate multilevel occlusive disease involving the superficial femoral artery, popliteal artery, tibial vessels and pedal vessels.  He is aware that while the superficial femoral, popliteal and tibial vessels can be intervened upon, pedal destruction from longstanding diabetes is not treatable.  We discussed the importance of elevation when able.  I would like to hold off on compression at this time due to his poor perfusion. I have started 81 milligrams aspirin daily  I will call his daughter later today to discuss this care plan.   Victorino Sparrow, MD [facility] [phone]

## 2023-08-19 NOTE — Progress Notes (Signed)
 Patient oozing in his left procedure site. MD made aware. Lidocaine/ epi was giving. Will continue to monitor for dc.

## 2023-08-19 NOTE — Progress Notes (Signed)
 Procedure site looks intact. Patient is ready for dc

## 2023-08-19 NOTE — Discharge Instructions (Signed)
 Femoral Site Care This sheet gives you information about how to care for yourself after your procedure. Your health care provider may also give you more specific instructions. If you have problems or questions, contact your health care provider. What can I expect after the procedure?  After the procedure, it is common to have: Bruising that usually fades within 1-2 weeks. Tenderness at the site. Follow these instructions at home: Wound care Follow instructions from your health care provider about how to take care of your insertion site. Make sure you: Wash your hands with soap and water before you change your bandage (dressing). If soap and water are not available, use hand sanitizer. Remove your dressing as told by your health care provider. 24 hours Do not take baths, swim, or use a hot tub until your health care provider approves. You may shower 24-48 hours after the procedure or as told by your health care provider. Gently wash the site with plain soap and water. Pat the area dry with a clean towel. Do not rub the site. This may cause bleeding. Do not apply powder or lotion to the site. Keep the site clean and dry. Check your femoral site every day for signs of infection. Check for: Redness, swelling, or pain. Fluid or blood. Warmth. Pus or a bad smell. Activity For the first 2-3 days after your procedure, or as long as directed: Avoid climbing stairs as much as possible. Do not squat. Do not lift anything that is heavier than 10 lb (4.5 kg), or the limit that you are told, until your health care provider says that it is safe. For 5 days Rest as directed. Avoid sitting for a long time without moving. Get up to take short walks every 1-2 hours. Do not drive for 24 hours if you were given a medicine to help you relax (sedative). General instructions Take over-the-counter and prescription medicines only as told by your health care provider. Keep all follow-up visits as told by your  health care provider. This is important. Contact a health care provider if you have: A fever or chills. You have redness, swelling, or pain around your insertion site. Get help right away if: The catheter insertion area swells very fast. You Lwin out. You suddenly start to sweat or your skin gets clammy. The catheter insertion area is bleeding, and the bleeding does not stop when you hold steady pressure on the area. The area near or just beyond the catheter insertion site becomes pale, cool, tingly, or numb. These symptoms may represent a serious problem that is an emergency. Do not wait to see if the symptoms will go away. Get medical help right away. Call your local emergency services (911 in the [place] Do not drive yourself to the hospital. Summary After the procedure, it is common to have bruising that usually fades within 1-2 weeks. Check your femoral site every day for signs of infection. Do not lift anything that is heavier than 10 lb (4.5 kg), or the limit that you are told, until your health care provider says that it is safe. This information is not intended to replace advice given to you by your health care provider. Make sure you discuss any questions you have with your health care provider. Document Revised: 06/01/2017 Document Reviewed: 06/01/2017 Elsevier Patient Education  2020 [employer].

## 2023-08-19 NOTE — Progress Notes (Signed)
 Office Note   Patient seen and examined in preop holding.  No complaints. No changes to medication history or physical exam since last seen in clinic. After discussing the risks and benefits of RLE angiogram for CLI 5, [name] elected to proceed.   Victorino Sparrow MD   CC:  Nonhealing wound right lower extremity Requesting Provider:  No ref. provider found  HPI: [name] is a 73 y.o. ([birth year]) male presenting at the request of .Kyle Bender, MD for right lower extremity wounds and nonpalpable pulse.  On exam, wound was doing well, accompanied by his aide.  He lives in assisted living home status post stroke.  He continues to have no use of the right arm, but is able to weight-bear on the right leg.  He only ambulates with Occupational Therapy, but can stand and pivot.  Kyle had wounds on the right lower extremity for months.  Healing has stagnated. Denies symptoms of claudication, ischemic rest pain. Known history of DVT, PAD  The pt is on a statin for cholesterol management.  The pt is not on a daily aspirin.   Other AC:  - The pt is is on medication for hypertension.   The pt is  diabetic.  Tobacco hx:  former  Past Medical History:  Diagnosis Date   CVA (cerebral infarction) 01/03/2008   MRI: Acute 1 x 1.5 cm infarction affecting the left side of the pons.   Diabetes mellitus    DVT (deep venous thrombosis) (HCC)    Hypertension    PAD (peripheral artery disease) (HCC)    Stroke (HCC)    2008    Past Surgical History:  Procedure Laterality Date   COLONOSCOPY     None      Social History   Socioeconomic History   Marital status: Divorced    Spouse name: Not on file   Number of children: 3   Years of education: 14   Highest education level: Not on file  Occupational History   Occupation: Disabled-security guard    Employer: [employer]  Tobacco Use   Smoking status: Never   Smokeless tobacco: Never  Substance and Sexual Activity    Alcohol use: No   Drug use: No   Sexual activity: Never    Partners: Female  Other Topics Concern   Not on file  Social History Narrative   Divorced, lives alone   2 sons, 1 daughter   Retired/disabled   No caffeine   12/24/2015   Social Drivers of Health   Financial Resource Strain: Not on file  Food Insecurity: Food Insecurity Present (03/25/2023)   Hunger Vital Sign    Worried About Running Out of Food in the Last Year: Sometimes true    Ran Out of Food in the Last Year: Sometimes true  Transportation Needs: No Transportation Needs (03/25/2023)   PRAPARE - Administrator, Civil Service (Medical): No    Lack of Transportation (Non-Medical): No  Physical Activity: Not on file  Stress: Not on file  Social Connections: Not on file  Intimate Partner Violence: Not At Risk (09/29/2022)   Humiliation, Afraid, Rape, and Kick questionnaire    Fear of Current or Ex-Partner: No    Emotionally Abused: No    Physically Abused: No    Sexually Abused: No   Family History  Problem Relation Age of Onset   Diabetes Mother    Heart attack Mother    Hypertension Mother  Diabetes Sister    Hypertension Sister    Stroke Sister        2017   Hypertension Brother    Hypertension Daughter    Hypertension Son    Colon cancer Neg Hx    Esophageal cancer Neg Hx    Stomach cancer Neg Hx    Rectal cancer Neg Hx     Current Facility-Administered Medications  Medication Dose Route Frequency Provider Last Rate Last Admin   0.9 %  sodium chloride infusion   Intravenous Continuous Victorino Sparrow, MD 100 mL/hr at 08/19/23 0802 New Bag at 08/19/23 0802    Allergies  Allergen Reactions   Pork Allergy Other (See Comments)    Raises Blood pressure per report   Shrimp Flavor Agent (Non-Screening) Other (See Comments)    Doesn't like it     REVIEW OF SYSTEMS:  [X]  denotes positive finding, [ ]  denotes negative finding Cardiac  Comments:  Chest pain or chest pressure:     Shortness of breath upon exertion:    Short of breath when lying flat:    Irregular heart rhythm:        Vascular    Pain in calf, thigh, or hip brought on by ambulation:    Pain in feet at night that wakes you up from your sleep:     Blood clot in your veins:    Leg swelling:         Pulmonary    Oxygen at home:    Productive cough:     Wheezing:         Neurologic    Sudden weakness in arms or legs:     Sudden numbness in arms or legs:     Sudden onset of difficulty speaking or slurred speech:    Temporary loss of vision in one eye:     Problems with dizziness:         Gastrointestinal    Blood in stool:     Vomited blood:         Genitourinary    Burning when urinating:     Blood in urine:        Psychiatric    Major depression:         Hematologic    Bleeding problems:    Problems with blood clotting too easily:        Skin    Rashes or ulcers:        Constitutional    Fever or chills:      PHYSICAL EXAMINATION:  Vitals:   08/19/23 0709  BP: (!) 196/81  Pulse: (!) 55  Resp: 16  Temp: 98.3 F (36.8 C)  TempSrc: Oral  SpO2: 99%  Weight: 91.6 kg  Height: 5\' 7"  (1.702 m)    General:  WDWN in NAD; vital signs documented above Gait: Not observed HENT: WNL, normocephalic Pulmonary: normal non-labored breathing , without wheezing Cardiac: regular HR Abdomen: soft, NT, no masses Skin: without rashes Vascular Exam/Pulses:  Right Left  Radial 2+ (normal) 2+ (normal)  Ulnar    Femoral 2+ (normal) 2+ (normal)  Popliteal    DP absent absent  PT     Extremities: with ischemic changes, without Gangrene , without cellulitis; with open wounds;  Wounds of the right leg are mixed etiology, vascular and venous.  Leg swelling appreciated as well Musculoskeletal: no muscle wasting or atrophy  Neurologic: A&O X 3; slow speech, collecting thoughts status post stroke, right arm weakness, right  leg with some weakness. Psychiatric:  The pt has Normal  affect.   Non-Invasive Vascular Imaging:     ABI Findings:  +---------+------------------+-----+----------+--------+  Right   Rt Pressure (mmHg)IndexWaveform  Comment   +---------+------------------+-----+----------+--------+  Brachial 208                                        +---------+------------------+-----+----------+--------+  PTA                            monophasic          +---------+------------------+-----+----------+--------+  DP                             monophasic          +---------+------------------+-----+----------+--------+  [place] Toe31                0.15                     +---------+------------------+-----+----------+--------+   +---------+------------------+-----+----------+-------+  Left    Lt Pressure (mmHg)IndexWaveform  Comment  +---------+------------------+-----+----------+-------+  Brachial 193                                       +---------+------------------+-----+----------+-------+  PTA                            monophasic         +---------+------------------+-----+----------+-------+  DP                             monophasic         +---------+------------------+-----+----------+-------+  Great Toe48                0.23                    +---------+------------------+-----+----------+-------+   +-------+-----------+-----------+------------+------------+  ABI/TBIToday's ABIToday's TBIPrevious ABIPrevious TBI  +-------+-----------+-----------+------------+------------+  Right [place]         0.15                                 +-------+-----------+-----------+------------+------------+  Left  [place]         0.23                                 +-------+-----------+-----------+------------+------------+      ASSESSMENT/PLAN: VALON GLASSCOCK is a 73 y.o. male presenting with chronic, nonhealing, mixed arteriovenous wounds on the right lower extremity.  On  physical exam, Lilly had palpable femoral pulses, nonpalpable pulses in the feet.  Wounds ranged from the calf, to onto the dorsum of the foot.  These were dry.  Really has significant swelling, likely sequela from previous DVT and sitting in a dependent position for the majority of the day as he is relatively nonambulatory.  ABI was reviewed demonstrating severe peripheral arterial disease bilaterally.  With his level of perfusion, I am not surprised that these wounds have not healed.  Ivar Drape and I had a long discussion regarding the above,  most notably that lower extremity perfusion needs to be improved in an effort to heal these wounds.  Without this, he will likely require amputation.  With his current ambulatory status the only amputation I would offer would be above-knee amputation.  After discussed the risks of bilateral extremity angiography with emphasis on the right due to critical limb ischemia with tissue loss.  Will elected to proceed.  I think that this will demonstrate multilevel occlusive disease involving the superficial femoral artery, popliteal artery, tibial vessels and pedal vessels.  He is aware that while the superficial femoral, popliteal and tibial vessels can be intervened upon, pedal destruction from longstanding diabetes is not treatable.  We discussed the importance of elevation when able.  I would like to hold off on compression at this time due to his poor perfusion. I have started 81 milligrams aspirin daily  I will call his daughter later today to discuss this care plan.   Victorino Sparrow, MD [facility] [phone]

## 2023-08-20 ENCOUNTER — Encounter (HOSPITAL_COMMUNITY): Payer: Self-pay | Admitting: Vascular Surgery

## 2023-08-21 DIAGNOSIS — G8191 Hemiplegia, unspecified affecting right dominant side: Secondary | ICD-10-CM | POA: Diagnosis not present

## 2023-08-21 DIAGNOSIS — C61 Malignant neoplasm of prostate: Secondary | ICD-10-CM | POA: Diagnosis not present

## 2023-08-21 DIAGNOSIS — M6281 Muscle weakness (generalized): Secondary | ICD-10-CM | POA: Diagnosis not present

## 2023-08-22 DIAGNOSIS — G8191 Hemiplegia, unspecified affecting right dominant side: Secondary | ICD-10-CM | POA: Diagnosis not present

## 2023-08-22 DIAGNOSIS — C61 Malignant neoplasm of prostate: Secondary | ICD-10-CM | POA: Diagnosis not present

## 2023-08-22 DIAGNOSIS — M6281 Muscle weakness (generalized): Secondary | ICD-10-CM | POA: Diagnosis not present

## 2023-08-23 DIAGNOSIS — M6281 Muscle weakness (generalized): Secondary | ICD-10-CM | POA: Diagnosis not present

## 2023-08-23 DIAGNOSIS — G8191 Hemiplegia, unspecified affecting right dominant side: Secondary | ICD-10-CM | POA: Diagnosis not present

## 2023-08-23 DIAGNOSIS — C61 Malignant neoplasm of prostate: Secondary | ICD-10-CM | POA: Diagnosis not present

## 2023-08-24 DIAGNOSIS — M6281 Muscle weakness (generalized): Secondary | ICD-10-CM | POA: Diagnosis not present

## 2023-08-24 DIAGNOSIS — L97215 Non-pressure chronic ulcer of right calf with muscle involvement without evidence of necrosis: Secondary | ICD-10-CM | POA: Diagnosis not present

## 2023-08-24 DIAGNOSIS — G8191 Hemiplegia, unspecified affecting right dominant side: Secondary | ICD-10-CM | POA: Diagnosis not present

## 2023-08-24 DIAGNOSIS — C61 Malignant neoplasm of prostate: Secondary | ICD-10-CM | POA: Diagnosis not present

## 2023-08-26 DIAGNOSIS — R278 Other lack of coordination: Secondary | ICD-10-CM | POA: Diagnosis not present

## 2023-08-26 DIAGNOSIS — E119 Type 2 diabetes mellitus without complications: Secondary | ICD-10-CM | POA: Diagnosis not present

## 2023-08-26 DIAGNOSIS — I69320 Aphasia following cerebral infarction: Secondary | ICD-10-CM | POA: Diagnosis not present

## 2023-08-26 DIAGNOSIS — G8191 Hemiplegia, unspecified affecting right dominant side: Secondary | ICD-10-CM | POA: Diagnosis not present

## 2023-08-26 DIAGNOSIS — L97812 Non-pressure chronic ulcer of other part of right lower leg with fat layer exposed: Secondary | ICD-10-CM | POA: Diagnosis not present

## 2023-08-26 DIAGNOSIS — I739 Peripheral vascular disease, unspecified: Secondary | ICD-10-CM | POA: Diagnosis not present

## 2023-08-26 DIAGNOSIS — M6281 Muscle weakness (generalized): Secondary | ICD-10-CM | POA: Diagnosis not present

## 2023-08-27 DIAGNOSIS — M6281 Muscle weakness (generalized): Secondary | ICD-10-CM | POA: Diagnosis not present

## 2023-08-27 DIAGNOSIS — G8191 Hemiplegia, unspecified affecting right dominant side: Secondary | ICD-10-CM | POA: Diagnosis not present

## 2023-08-27 DIAGNOSIS — C61 Malignant neoplasm of prostate: Secondary | ICD-10-CM | POA: Diagnosis not present

## 2023-08-28 DIAGNOSIS — M6281 Muscle weakness (generalized): Secondary | ICD-10-CM | POA: Diagnosis not present

## 2023-08-28 DIAGNOSIS — R3914 Feeling of incomplete bladder emptying: Secondary | ICD-10-CM | POA: Diagnosis not present

## 2023-08-28 DIAGNOSIS — G8191 Hemiplegia, unspecified affecting right dominant side: Secondary | ICD-10-CM | POA: Diagnosis not present

## 2023-08-28 DIAGNOSIS — L0390 Cellulitis, unspecified: Secondary | ICD-10-CM | POA: Diagnosis not present

## 2023-08-28 DIAGNOSIS — C61 Malignant neoplasm of prostate: Secondary | ICD-10-CM | POA: Diagnosis not present

## 2023-08-31 DIAGNOSIS — C61 Malignant neoplasm of prostate: Secondary | ICD-10-CM | POA: Diagnosis not present

## 2023-08-31 DIAGNOSIS — G8191 Hemiplegia, unspecified affecting right dominant side: Secondary | ICD-10-CM | POA: Diagnosis not present

## 2023-08-31 DIAGNOSIS — G8929 Other chronic pain: Secondary | ICD-10-CM | POA: Diagnosis not present

## 2023-08-31 DIAGNOSIS — K921 Melena: Secondary | ICD-10-CM | POA: Diagnosis not present

## 2023-08-31 DIAGNOSIS — M6281 Muscle weakness (generalized): Secondary | ICD-10-CM | POA: Diagnosis not present

## 2023-09-01 DIAGNOSIS — I10 Essential (primary) hypertension: Secondary | ICD-10-CM | POA: Diagnosis not present

## 2023-09-01 DIAGNOSIS — I69320 Aphasia following cerebral infarction: Secondary | ICD-10-CM | POA: Diagnosis not present

## 2023-09-01 DIAGNOSIS — I739 Peripheral vascular disease, unspecified: Secondary | ICD-10-CM | POA: Diagnosis not present

## 2023-09-01 DIAGNOSIS — M6281 Muscle weakness (generalized): Secondary | ICD-10-CM | POA: Diagnosis not present

## 2023-09-01 DIAGNOSIS — L97812 Non-pressure chronic ulcer of other part of right lower leg with fat layer exposed: Secondary | ICD-10-CM | POA: Diagnosis not present

## 2023-09-01 DIAGNOSIS — E119 Type 2 diabetes mellitus without complications: Secondary | ICD-10-CM | POA: Diagnosis not present

## 2023-09-01 DIAGNOSIS — R278 Other lack of coordination: Secondary | ICD-10-CM | POA: Diagnosis not present

## 2023-09-01 DIAGNOSIS — G8191 Hemiplegia, unspecified affecting right dominant side: Secondary | ICD-10-CM | POA: Diagnosis not present

## 2023-09-02 DIAGNOSIS — M6281 Muscle weakness (generalized): Secondary | ICD-10-CM | POA: Diagnosis not present

## 2023-09-02 DIAGNOSIS — G8191 Hemiplegia, unspecified affecting right dominant side: Secondary | ICD-10-CM | POA: Diagnosis not present

## 2023-09-02 DIAGNOSIS — C61 Malignant neoplasm of prostate: Secondary | ICD-10-CM | POA: Diagnosis not present

## 2023-09-03 DIAGNOSIS — M6281 Muscle weakness (generalized): Secondary | ICD-10-CM | POA: Diagnosis not present

## 2023-09-03 DIAGNOSIS — C61 Malignant neoplasm of prostate: Secondary | ICD-10-CM | POA: Diagnosis not present

## 2023-09-03 DIAGNOSIS — G8191 Hemiplegia, unspecified affecting right dominant side: Secondary | ICD-10-CM | POA: Diagnosis not present

## 2023-09-04 DIAGNOSIS — E119 Type 2 diabetes mellitus without complications: Secondary | ICD-10-CM | POA: Diagnosis not present

## 2023-09-04 DIAGNOSIS — D649 Anemia, unspecified: Secondary | ICD-10-CM | POA: Diagnosis not present

## 2023-09-04 DIAGNOSIS — I10 Essential (primary) hypertension: Secondary | ICD-10-CM | POA: Diagnosis not present

## 2023-09-08 DIAGNOSIS — I10 Essential (primary) hypertension: Secondary | ICD-10-CM | POA: Diagnosis not present

## 2023-09-08 DIAGNOSIS — C61 Malignant neoplasm of prostate: Secondary | ICD-10-CM | POA: Diagnosis not present

## 2023-09-08 DIAGNOSIS — I739 Peripheral vascular disease, unspecified: Secondary | ICD-10-CM | POA: Diagnosis not present

## 2023-09-08 DIAGNOSIS — F32A Depression, unspecified: Secondary | ICD-10-CM | POA: Diagnosis not present

## 2023-09-08 DIAGNOSIS — N139 Obstructive and reflux uropathy, unspecified: Secondary | ICD-10-CM | POA: Diagnosis not present

## 2023-09-08 DIAGNOSIS — G8191 Hemiplegia, unspecified affecting right dominant side: Secondary | ICD-10-CM | POA: Diagnosis not present

## 2023-09-08 DIAGNOSIS — M6281 Muscle weakness (generalized): Secondary | ICD-10-CM | POA: Diagnosis not present

## 2023-09-08 DIAGNOSIS — R279 Unspecified lack of coordination: Secondary | ICD-10-CM | POA: Diagnosis not present

## 2023-09-08 DIAGNOSIS — R1311 Dysphagia, oral phase: Secondary | ICD-10-CM | POA: Diagnosis not present

## 2023-09-08 DIAGNOSIS — I69320 Aphasia following cerebral infarction: Secondary | ICD-10-CM | POA: Diagnosis not present

## 2023-09-08 DIAGNOSIS — E118 Type 2 diabetes mellitus with unspecified complications: Secondary | ICD-10-CM | POA: Diagnosis not present

## 2023-09-08 DIAGNOSIS — I6930 Unspecified sequelae of cerebral infarction: Secondary | ICD-10-CM | POA: Diagnosis not present

## 2023-09-09 DIAGNOSIS — N401 Enlarged prostate with lower urinary tract symptoms: Secondary | ICD-10-CM | POA: Diagnosis not present

## 2023-09-09 DIAGNOSIS — I739 Peripheral vascular disease, unspecified: Secondary | ICD-10-CM | POA: Diagnosis not present

## 2023-09-09 DIAGNOSIS — R278 Other lack of coordination: Secondary | ICD-10-CM | POA: Diagnosis not present

## 2023-09-09 DIAGNOSIS — C61 Malignant neoplasm of prostate: Secondary | ICD-10-CM | POA: Diagnosis not present

## 2023-09-09 DIAGNOSIS — E119 Type 2 diabetes mellitus without complications: Secondary | ICD-10-CM | POA: Diagnosis not present

## 2023-09-09 DIAGNOSIS — C775 Secondary and unspecified malignant neoplasm of intrapelvic lymph nodes: Secondary | ICD-10-CM | POA: Diagnosis not present

## 2023-09-09 DIAGNOSIS — R338 Other retention of urine: Secondary | ICD-10-CM | POA: Diagnosis not present

## 2023-09-09 DIAGNOSIS — I10 Essential (primary) hypertension: Secondary | ICD-10-CM | POA: Diagnosis not present

## 2023-09-09 DIAGNOSIS — L97812 Non-pressure chronic ulcer of other part of right lower leg with fat layer exposed: Secondary | ICD-10-CM | POA: Diagnosis not present

## 2023-09-09 DIAGNOSIS — M6281 Muscle weakness (generalized): Secondary | ICD-10-CM | POA: Diagnosis not present

## 2023-09-09 DIAGNOSIS — D649 Anemia, unspecified: Secondary | ICD-10-CM | POA: Diagnosis not present

## 2023-09-09 DIAGNOSIS — G8191 Hemiplegia, unspecified affecting right dominant side: Secondary | ICD-10-CM | POA: Diagnosis not present

## 2023-09-09 DIAGNOSIS — I69320 Aphasia following cerebral infarction: Secondary | ICD-10-CM | POA: Diagnosis not present

## 2023-09-10 DIAGNOSIS — I10 Essential (primary) hypertension: Secondary | ICD-10-CM | POA: Diagnosis not present

## 2023-09-11 DIAGNOSIS — N390 Urinary tract infection, site not specified: Secondary | ICD-10-CM | POA: Diagnosis not present

## 2023-09-11 DIAGNOSIS — E1122 Type 2 diabetes mellitus with diabetic chronic kidney disease: Secondary | ICD-10-CM | POA: Diagnosis not present

## 2023-09-11 DIAGNOSIS — D638 Anemia in other chronic diseases classified elsewhere: Secondary | ICD-10-CM | POA: Diagnosis not present

## 2023-09-11 DIAGNOSIS — N182 Chronic kidney disease, stage 2 (mild): Secondary | ICD-10-CM | POA: Diagnosis not present

## 2023-09-11 DIAGNOSIS — Z794 Long term (current) use of insulin: Secondary | ICD-10-CM | POA: Diagnosis not present

## 2023-09-11 DIAGNOSIS — Z8619 Personal history of other infectious and parasitic diseases: Secondary | ICD-10-CM | POA: Diagnosis not present

## 2023-09-12 DIAGNOSIS — F339 Major depressive disorder, recurrent, unspecified: Secondary | ICD-10-CM | POA: Diagnosis not present

## 2023-09-15 ENCOUNTER — Other Ambulatory Visit (HOSPITAL_COMMUNITY): Payer: Self-pay | Admitting: Urology

## 2023-09-15 ENCOUNTER — Encounter (HOSPITAL_COMMUNITY): Payer: Self-pay | Admitting: Urology

## 2023-09-15 DIAGNOSIS — C61 Malignant neoplasm of prostate: Secondary | ICD-10-CM

## 2023-09-15 DIAGNOSIS — C775 Secondary and unspecified malignant neoplasm of intrapelvic lymph nodes: Secondary | ICD-10-CM

## 2023-09-18 DIAGNOSIS — L97812 Non-pressure chronic ulcer of other part of right lower leg with fat layer exposed: Secondary | ICD-10-CM | POA: Diagnosis not present

## 2023-09-18 DIAGNOSIS — E119 Type 2 diabetes mellitus without complications: Secondary | ICD-10-CM | POA: Diagnosis not present

## 2023-09-18 DIAGNOSIS — I739 Peripheral vascular disease, unspecified: Secondary | ICD-10-CM | POA: Diagnosis not present

## 2023-09-18 DIAGNOSIS — M6281 Muscle weakness (generalized): Secondary | ICD-10-CM | POA: Diagnosis not present

## 2023-09-18 DIAGNOSIS — R278 Other lack of coordination: Secondary | ICD-10-CM | POA: Diagnosis not present

## 2023-09-18 DIAGNOSIS — I69320 Aphasia following cerebral infarction: Secondary | ICD-10-CM | POA: Diagnosis not present

## 2023-09-18 DIAGNOSIS — G8191 Hemiplegia, unspecified affecting right dominant side: Secondary | ICD-10-CM | POA: Diagnosis not present

## 2023-09-23 DIAGNOSIS — G8191 Hemiplegia, unspecified affecting right dominant side: Secondary | ICD-10-CM | POA: Diagnosis not present

## 2023-09-23 DIAGNOSIS — I10 Essential (primary) hypertension: Secondary | ICD-10-CM | POA: Diagnosis not present

## 2023-09-23 DIAGNOSIS — R1311 Dysphagia, oral phase: Secondary | ICD-10-CM | POA: Diagnosis not present

## 2023-09-23 DIAGNOSIS — I739 Peripheral vascular disease, unspecified: Secondary | ICD-10-CM | POA: Diagnosis not present

## 2023-09-23 DIAGNOSIS — C61 Malignant neoplasm of prostate: Secondary | ICD-10-CM | POA: Diagnosis not present

## 2023-09-23 DIAGNOSIS — E118 Type 2 diabetes mellitus with unspecified complications: Secondary | ICD-10-CM | POA: Diagnosis not present

## 2023-09-23 DIAGNOSIS — I6930 Unspecified sequelae of cerebral infarction: Secondary | ICD-10-CM | POA: Diagnosis not present

## 2023-09-23 DIAGNOSIS — M6281 Muscle weakness (generalized): Secondary | ICD-10-CM | POA: Diagnosis not present

## 2023-09-23 DIAGNOSIS — I69320 Aphasia following cerebral infarction: Secondary | ICD-10-CM | POA: Diagnosis not present

## 2023-09-23 DIAGNOSIS — N139 Obstructive and reflux uropathy, unspecified: Secondary | ICD-10-CM | POA: Diagnosis not present

## 2023-09-23 DIAGNOSIS — R279 Unspecified lack of coordination: Secondary | ICD-10-CM | POA: Diagnosis not present

## 2023-09-23 DIAGNOSIS — F32A Depression, unspecified: Secondary | ICD-10-CM | POA: Diagnosis not present

## 2023-09-24 DIAGNOSIS — R059 Cough, unspecified: Secondary | ICD-10-CM | POA: Diagnosis not present

## 2023-09-25 DIAGNOSIS — L97215 Non-pressure chronic ulcer of right calf with muscle involvement without evidence of necrosis: Secondary | ICD-10-CM | POA: Diagnosis not present

## 2023-09-26 DIAGNOSIS — J189 Pneumonia, unspecified organism: Secondary | ICD-10-CM | POA: Diagnosis not present

## 2023-09-26 DIAGNOSIS — U071 COVID-19: Secondary | ICD-10-CM | POA: Diagnosis not present

## 2023-10-01 DIAGNOSIS — E119 Type 2 diabetes mellitus without complications: Secondary | ICD-10-CM | POA: Diagnosis not present

## 2023-10-01 DIAGNOSIS — G8191 Hemiplegia, unspecified affecting right dominant side: Secondary | ICD-10-CM | POA: Diagnosis not present

## 2023-10-01 DIAGNOSIS — M6281 Muscle weakness (generalized): Secondary | ICD-10-CM | POA: Diagnosis not present

## 2023-10-01 DIAGNOSIS — L97812 Non-pressure chronic ulcer of other part of right lower leg with fat layer exposed: Secondary | ICD-10-CM | POA: Diagnosis not present

## 2023-10-01 DIAGNOSIS — I69320 Aphasia following cerebral infarction: Secondary | ICD-10-CM | POA: Diagnosis not present

## 2023-10-01 DIAGNOSIS — R278 Other lack of coordination: Secondary | ICD-10-CM | POA: Diagnosis not present

## 2023-10-01 DIAGNOSIS — I739 Peripheral vascular disease, unspecified: Secondary | ICD-10-CM | POA: Diagnosis not present

## 2023-10-02 ENCOUNTER — Emergency Department (HOSPITAL_COMMUNITY)

## 2023-10-02 ENCOUNTER — Emergency Department (HOSPITAL_COMMUNITY)
Admission: EM | Admit: 2023-10-02 | Discharge: 2023-10-02 | Disposition: A | Discharge status: Skilled Nursing Facility | Attending: Emergency Medicine | Admitting: Emergency Medicine

## 2023-10-02 ENCOUNTER — Other Ambulatory Visit: Payer: Self-pay | Admitting: *Deleted

## 2023-10-02 DIAGNOSIS — N182 Chronic kidney disease, stage 2 (mild): Secondary | ICD-10-CM | POA: Diagnosis not present

## 2023-10-02 DIAGNOSIS — I69392 Facial weakness following cerebral infarction: Secondary | ICD-10-CM | POA: Insufficient documentation

## 2023-10-02 DIAGNOSIS — M5001 Cervical disc disorder with myelopathy,  high cervical region: Secondary | ICD-10-CM | POA: Diagnosis not present

## 2023-10-02 DIAGNOSIS — S00511A Abrasion of lip, initial encounter: Secondary | ICD-10-CM | POA: Insufficient documentation

## 2023-10-02 DIAGNOSIS — I6782 Cerebral ischemia: Secondary | ICD-10-CM | POA: Diagnosis not present

## 2023-10-02 DIAGNOSIS — R404 Transient alteration of awareness: Secondary | ICD-10-CM | POA: Diagnosis not present

## 2023-10-02 DIAGNOSIS — Z794 Long term (current) use of insulin: Secondary | ICD-10-CM | POA: Diagnosis not present

## 2023-10-02 DIAGNOSIS — Z743 Need for continuous supervision: Secondary | ICD-10-CM | POA: Diagnosis not present

## 2023-10-02 DIAGNOSIS — I129 Hypertensive chronic kidney disease with stage 1 through stage 4 chronic kidney disease, or unspecified chronic kidney disease: Secondary | ICD-10-CM | POA: Diagnosis not present

## 2023-10-02 DIAGNOSIS — E1122 Type 2 diabetes mellitus with diabetic chronic kidney disease: Secondary | ICD-10-CM | POA: Insufficient documentation

## 2023-10-02 DIAGNOSIS — Z7902 Long term (current) use of antithrombotics/antiplatelets: Secondary | ICD-10-CM | POA: Insufficient documentation

## 2023-10-02 DIAGNOSIS — Z79899 Other long term (current) drug therapy: Secondary | ICD-10-CM | POA: Diagnosis not present

## 2023-10-02 DIAGNOSIS — S0990XA Unspecified injury of head, initial encounter: Secondary | ICD-10-CM | POA: Insufficient documentation

## 2023-10-02 DIAGNOSIS — W1830XA Fall on same level, unspecified, initial encounter: Secondary | ICD-10-CM | POA: Insufficient documentation

## 2023-10-02 DIAGNOSIS — Z7984 Long term (current) use of oral hypoglycemic drugs: Secondary | ICD-10-CM | POA: Diagnosis not present

## 2023-10-02 DIAGNOSIS — G959 Disease of spinal cord, unspecified: Secondary | ICD-10-CM | POA: Insufficient documentation

## 2023-10-02 DIAGNOSIS — Z23 Encounter for immunization: Secondary | ICD-10-CM | POA: Insufficient documentation

## 2023-10-02 DIAGNOSIS — I69351 Hemiplegia and hemiparesis following cerebral infarction affecting right dominant side: Secondary | ICD-10-CM | POA: Insufficient documentation

## 2023-10-02 DIAGNOSIS — Z7982 Long term (current) use of aspirin: Secondary | ICD-10-CM | POA: Diagnosis not present

## 2023-10-02 DIAGNOSIS — S01511A Laceration without foreign body of lip, initial encounter: Secondary | ICD-10-CM | POA: Diagnosis not present

## 2023-10-02 DIAGNOSIS — I739 Peripheral vascular disease, unspecified: Secondary | ICD-10-CM

## 2023-10-02 DIAGNOSIS — W19XXXA Unspecified fall, initial encounter: Secondary | ICD-10-CM | POA: Diagnosis not present

## 2023-10-02 DIAGNOSIS — M47812 Spondylosis without myelopathy or radiculopathy, cervical region: Secondary | ICD-10-CM | POA: Diagnosis not present

## 2023-10-02 DIAGNOSIS — S14109A Unspecified injury at unspecified level of cervical spinal cord, initial encounter: Secondary | ICD-10-CM | POA: Diagnosis not present

## 2023-10-02 DIAGNOSIS — M5023 Other cervical disc displacement, cervicothoracic region: Secondary | ICD-10-CM | POA: Diagnosis not present

## 2023-10-02 DIAGNOSIS — S199XXA Unspecified injury of neck, initial encounter: Secondary | ICD-10-CM | POA: Diagnosis not present

## 2023-10-02 DIAGNOSIS — G9520 Unspecified cord compression: Secondary | ICD-10-CM

## 2023-10-02 DIAGNOSIS — G9589 Other specified diseases of spinal cord: Secondary | ICD-10-CM

## 2023-10-02 DIAGNOSIS — M4802 Spinal stenosis, cervical region: Secondary | ICD-10-CM | POA: Diagnosis not present

## 2023-10-02 DIAGNOSIS — I10 Essential (primary) hypertension: Secondary | ICD-10-CM | POA: Diagnosis not present

## 2023-10-02 DIAGNOSIS — Z043 Encounter for examination and observation following other accident: Secondary | ICD-10-CM | POA: Diagnosis not present

## 2023-10-02 MED ORDER — ATORVASTATIN CALCIUM 40 MG PO TABS
40.0000 mg | ORAL_TABLET | Freq: Every day | ORAL | Status: DC
  Administered 2023-10-02: 40 mg via ORAL
  Filled 2023-10-02: qty 1

## 2023-10-02 MED ORDER — FENTANYL CITRATE PF 50 MCG/ML IJ SOSY
50.0000 ug | PREFILLED_SYRINGE | Freq: Once | INTRAMUSCULAR | Status: AC
  Administered 2023-10-02: 50 ug via INTRAMUSCULAR
  Filled 2023-10-02: qty 1

## 2023-10-02 MED ORDER — AMLODIPINE-OLMESARTAN 10-20 MG PO TABS: 1.0000 | ORAL_TABLET | Freq: Every day | ORAL | Status: DC

## 2023-10-02 MED ORDER — ASPIRIN 81 MG PO TBEC
81.0000 mg | DELAYED_RELEASE_TABLET | Freq: Every day | ORAL | Status: DC
  Administered 2023-10-02: 81 mg via ORAL
  Filled 2023-10-02: qty 1

## 2023-10-02 MED ORDER — DOXYCYCLINE HYCLATE 100 MG PO TABS: 100.0000 mg | ORAL_TABLET | Freq: Two times a day (BID) | ORAL | Status: DC

## 2023-10-02 MED ORDER — TIZANIDINE HCL 4 MG PO TABS
2.0000 mg | ORAL_TABLET | Freq: Three times a day (TID) | ORAL | Status: DC
  Administered 2023-10-02: 2 mg via ORAL
  Filled 2023-10-02: qty 1

## 2023-10-02 MED ORDER — TETANUS-DIPHTH-ACELL PERTUSSIS 5-2.5-18.5 LF-MCG/0.5 IM SUSY
0.5000 mL | PREFILLED_SYRINGE | Freq: Once | INTRAMUSCULAR | Status: AC
  Administered 2023-10-02: 0.5 mL via INTRAMUSCULAR
  Filled 2023-10-02: qty 0.5

## 2023-10-02 MED ORDER — PANTOPRAZOLE SODIUM 20 MG PO TBEC
20.0000 mg | DELAYED_RELEASE_TABLET | Freq: Every day | ORAL | Status: DC
  Administered 2023-10-02: 20 mg via ORAL
  Filled 2023-10-02: qty 1

## 2023-10-02 MED ORDER — SERTRALINE HCL 100 MG PO TABS
100.0000 mg | ORAL_TABLET | Freq: Every day | ORAL | Status: DC
Start: 2023-10-02 — End: 2023-10-03
  Administered 2023-10-02: 100 mg via ORAL
  Filled 2023-10-02: qty 1

## 2023-10-02 MED ORDER — CARVEDILOL 12.5 MG PO TABS
12.5000 mg | ORAL_TABLET | Freq: Two times a day (BID) | ORAL | Status: DC
  Administered 2023-10-02: 12.5 mg via ORAL
  Filled 2023-10-02: qty 1

## 2023-10-02 MED ORDER — FENTANYL CITRATE PF 50 MCG/ML IJ SOSY: 50.0000 ug | PREFILLED_SYRINGE | Freq: Once | INTRAMUSCULAR | Status: DC

## 2023-10-02 NOTE — ED Notes (Signed)
 [facility] has been scheduled for the patient to return to [facility].   The patient requested to speak with the Child psychotherapist.  The social worker was contacted and given the [facility] request.

## 2023-10-02 NOTE — ED Triage Notes (Signed)
 Pt BIB [facility] from [facility] as a L@ F[facility].  Pt is on Plavix  and ASA and needed to go to bathroom.  Per EMS pt states that RN stated it wasn't her job to help him to bathroom and he needed to call for CNA so he tried to get up himself and he fell hitting the right side of his head and has a small lac to his right lip.   152/90 99% RA, HR 96, RR 18

## 2023-10-02 NOTE — ED Notes (Signed)
 Trauma Response Nurse Documentation   [name] is a 73 y.o. male arriving to [facility] via EMS  On clopidogrel  75 mg daily. Trauma was activated as a Level 2 by ED Charge RN based on the following trauma criteria Elderly patients > 65 with head trauma on anti-coagulation (excluding ASA).  Patient cleared for CT by Dr. Liam Redhead. Pt transported to CT with trauma response nurse present to monitor. RN remained with the patient throughout their absence from the department for clinical observation.   GCS 14.  History   Past Medical History:  Diagnosis Date   CVA (cerebral infarction) 01/03/2008   MRI: Acute 1 x 1.5 cm infarction affecting the left side of the pons.   Diabetes mellitus    DVT (deep venous thrombosis) (HCC)    Hypertension    PAD (peripheral artery disease) (HCC)    Stroke (HCC)    2008     Past Surgical History:  Procedure Laterality Date   ABDOMINAL AORTOGRAM W/LOWER EXTREMITY Right 08/19/2023   Procedure: ABDOMINAL AORTOGRAM W/LOWER EXTREMITY;  Surgeon: Kayla Part, MD;  Location: [facility] INVASIVE CV LAB;  Service: Cardiovascular;  Laterality: Right;   COLONOSCOPY     LOWER EXTREMITY ANGIOGRAPHY Right 08/19/2023   Procedure: Lower Extremity Angiography;  Surgeon: Kayla Part, MD;  Location: [facility] INVASIVE CV LAB;  Service: Cardiovascular;  Laterality: Right;   LOWER EXTREMITY INTERVENTION Right 08/19/2023   Procedure: LOWER EXTREMITY INTERVENTION;  Surgeon: Kayla Part, MD;  Location: [facility] INVASIVE CV LAB;  Service: Cardiovascular;  Laterality: Right;   None       Initial Focused Assessment (If applicable, or please see trauma documentation): Airway: intact, patent Breathing: No SOB, no CP, SpO2 100% on RA.  Circulation: Pulses intact centrally and peripherally. No active bleeding.  Disability: Delayed responses at baseline due to previous stroke.  Oriented x4. PERRLA. R side weakness same as from previous stroke. No new neuro deficits.  VS:  hypertensive.    CT's Completed:   CT Head, CT Maxillofacial, and CT C-Spine   Interventions:  CXR  Tdap  50 mcg fentanyl  IM. CT head, c-spine, max face.   Plan for disposition:  Other Awaiting scan reads.   Consults completed:  none at 1110.  Event Summary: Pt BIB PT[facility] after hitting the left side of his head due to a fall.  Pt states that he was on the toilet waiting for help to get him back to back (At [facility]).  Per pt, he waited 45 min and no one came so he decided to attempt to get up by himself.  His legs were too weak and he fell, striking the left side of his head.  No LOC.  Pt on plavix  due to previous stroke in 2008 per pt.  Pt only c/o L HA.   Bedside handoff with ED RN Josh.    [name]  Trauma Response RN  Please call TRN at [phone] for further assistance.

## 2023-10-02 NOTE — ED Notes (Signed)
 Leary Provencal with [facility] arrived to transport patient.

## 2023-10-02 NOTE — Discharge Instructions (Addendum)
 Please use Tylenol  for pain.  You may use 1000 mg of Tylenol  every 6 hours.  Not to exceed 4 g of Tylenol  within 24 hours.  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

## 2023-10-02 NOTE — ED Notes (Signed)
 A dinner tray has been ordered for the patient.

## 2023-10-02 NOTE — ED Provider Notes (Signed)
 [place] EMERGENCY DEPARTMENT AT [facility] Provider Note   CSN: [id number] Arrival date & time: 10/02/23  1022     History  No chief complaint on file.   [name] is a 73 y.o. 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  Patient was at [facility], asked a nurse for assistance ambulating and when he was not given any help he fell striking the right side of his head and face.  He denies loss of consciousness.  HPI     Home Medications Prior to Admission medications   Medication Sig Start Date End Date Taking? Authorizing Provider  abiraterone  acetate (ZYTIGA ) 250 MG tablet Take 1,000 mg by mouth daily. 09/12/22  Yes [provider]  acetaminophen  (TYLENOL ) 500 MG tablet Take 1,000 mg by mouth in the morning, at noon, and at bedtime.   Yes [provider]  amlodipine -olmesartan  (AZOR ) 10-20 MG tablet TAKE 1 TABLET BY MOUTH EVERY DAY 04/06/23  Yes Hindel, Leah, MD  aspirin  EC 81 MG tablet Take 1 tablet (81 mg total) by mouth daily. Swallow whole. 08/13/23  Yes Robins, Joshua E, MD  atorvastatin  (LIPITOR) 40 MG tablet Take 1 tablet (40 mg total) by mouth daily. 08/04/22  Yes Ganta, Anupa, DO  carvedilol  (COREG ) 12.5 MG tablet Take 1 tablet (12.5 mg total) by mouth 2 (two) times daily with a meal. 03/18/23 10/02/23 Yes Hindel, Leah, MD  clopidogrel  (PLAVIX ) 75 MG tablet Take 1 tablet (75 mg total) by mouth daily. 02/05/23  Yes Hindel, Leah, MD  doxycycline  (VIBRA -TABS) 100 MG tablet Take 100 mg by mouth 2 (two) times daily. 09/26/23 10/03/23 Yes [provider]  insulin  glargine (LANTUS  SOLOSTAR) 100 UNIT/ML Solostar Pen Inject 12 Units into the skin daily. Patient taking differently: Inject 18 Units into the skin daily. 02/19/23  Yes Hindel, Leah, MD   JARDIANCE  25 MG TABS tablet Take 25 mg by mouth daily. 09/28/23  Yes [provider]  meloxicam (MOBIC) 7.5 MG tablet Take 7.5 mg by mouth daily. 07/24/23  Yes [provider]  metFORMIN  (GLUCOPHAGE ) 1000 MG tablet Take 1 tablet (1,000 mg total) by mouth 2 (two) times daily with a meal. 02/17/23  Yes Hindel, Leah, MD  pantoprazole  (PROTONIX ) 20 MG tablet TAKE 1 TABLET BY MOUTH EVERY DAY 04/27/23  Yes Hindel, Leah, MD  predniSONE  (DELTASONE ) 5 MG tablet Take 5 mg by mouth daily. 09/12/22  Yes [provider]  sertraline  (ZOLOFT ) 100 MG tablet Take 1 tablet (100 mg total) by mouth daily. 01/02/23  Yes Hindel, Leah, MD  tamsulosin  (FLOMAX ) 0.4 MG CAPS capsule Take 0.4 mg by mouth daily. 05/07/23  Yes [provider]  tiZANidine  (ZANAFLEX ) 2 MG tablet Take 2 mg by mouth in the morning, at noon, and at bedtime. 08/11/23  Yes [provider]  ciprofloxacin (CIPRO) 750 MG tablet Take 750 mg by mouth 2 (two) times daily. Patient not taking: Reported on 10/02/2023 09/13/23 10/20/23  [provider]  ertapenem [facility]) 1 g injection Inject 1 g into the muscle daily. 09/13/23   [provider]  promethazine (PHENERGAN) 12.5 MG tablet Take 12.5 mg by mouth every 6 (six) hours as needed for vomiting, refractory nausea / vomiting or nausea. 09/20/23   [provider]      Allergies    Pork allergy  and Shrimp flavor agent (non-screening)    Review of Systems   Review of Systems  All other systems reviewed and are negative.   Physical Exam Updated Vital Signs BP (!) 166/77   Pulse 64   Temp 98.2 F (36.8 C) (Oral)   Resp 16   Ht 5\' 7"  (1.702 m)   Wt 91.6 kg   SpO2 100%   BMI 31.63 kg/m  Physical Exam Vitals and nursing note reviewed.  Constitutional:      General: He is not in acute distress.    Appearance: Normal appearance.  HENT:     Head: Normocephalic and atraumatic.  Eyes:     General:        Right eye: No discharge.         Left eye: No discharge.  Cardiovascular:     Rate and Rhythm: Normal rate and regular rhythm.     Heart sounds: No murmur heard.    No friction rub. No gallop.  Pulmonary:     Effort: Pulmonary effort is normal.     Breath sounds: Normal breath sounds.  Abdominal:     General: Bowel sounds are normal.     Palpations: Abdomen is soft.  Musculoskeletal:     Comments: No focal tenderness of bilateral shoulders, elbows, wrists, hips, knees.  Baseline strength deficit on the right with spastic hemiparesis, intact strength 5/5 with normal coordination of left upper and lower extremity.  Skin:    General: Skin is warm and dry.     Capillary Refill: Capillary refill takes less than 2 seconds.     Comments: Head is overall atraumatic, there is 1 area of small abrasion on the upper lip area with no active bleeding at this time.  Neurological:     Mental Status: He is alert.     Comments: Spastic hemiparesis of the right side is chronic, stable compared to baseline, he has some right sided facial droop which again is chronic, no new focal neurologic deficits noted.  Moves left upper and lower extremity with normal coordination, answers questions appropriately, alert and oriented x 3.  Psychiatric:        Mood and Affect: Mood normal.        Behavior: Behavior normal.     ED Results / Procedures / Treatments   Labs (all labs ordered are listed, but only abnormal results are displayed) Labs Reviewed - No data to display  EKG None  Radiology MR Cervical Spine Wo Contrast Result Date: 10/02/2023 CLINICAL DATA:  Degenerative changes in the cervical spine with possible cord compression at C3-4 noted on CT. EXAM: MRI CERVICAL SPINE WITHOUT CONTRAST TECHNIQUE: Multiplanar, multisequence MR imaging of the cervical spine was performed. No intravenous contrast was administered. COMPARISON:  Same day CT cervical spine. FINDINGS: Alignment: Straightening of the normal cervical lordosis. No listhesis.  Normal facet alignment. Vertebrae: No bone marrow edema or evidence of acute fracture. Vertebral body heights are maintained. Degenerative endplate osteophytes at multiple levels. No suspicious osseous lesions. Cord: There is cord compression appreciated at C3-4 and C4-5. Subtle T2/stir signal abnormality within the cervical cord at C4-5 which may reflect myelomalacia. Posterior Fossa, vertebral arteries, paraspinal tissues: Redemonstrated cystic focus within the posterior fossa which may reflect an arachnoid cyst. Remote infarct in the pons. The visualized paraspinal soft tissues are unremarkable. Disc levels: C2-3: Diffuse disc bulge which contacts and slightly flattens the ventral cervical cord. Thickening of the ligamentum flavum which indents the dorsal thecal sac. Moderate to  severe spinal canal stenosis. No cord signal abnormality at this level. Bilateral facet arthrosis and uncovertebral hypertrophy. Mild bilateral foraminal stenosis. C3-4: Disc osteophyte complex which abuts the ventral cervical cord as well as thickening of the ligamentum flavum which abuts the dorsal cord. Severe spinal canal stenosis and cord compression at this level with associated mild cord signal abnormality. Bilateral facet arthrosis and uncovertebral hypertrophy resulting in moderate bilateral foraminal stenosis. C4-5: Disc osteophyte complex which abuts the ventral cervical cord. Thickening of the ligamentum flavum abuts the dorsal cord. Severe spinal canal stenosis and cord compression with associated cord signal abnormality. Disc osteophyte complex, uncovertebral hypertrophy, and facet arthrosis resulting in severe foraminal stenosis on the right. Additional moderate foraminal stenosis on the left. C5-6: Disc osteophyte complex which indents the ventral thecal sac with mild flattening of the ventral cervical cord. Thickening of the ligamentum flavum which indents the dorsal thecal sac. Moderate to severe spinal canal stenosis at  this level. Uncovertebral hypertrophy and facet arthrosis resulting in moderate bilateral foraminal stenosis. C6-7: Disc osteophyte complex eccentric to the right which indents the ventral thecal sac with flattening of the ventral cervical cord. Thickening of the ligamentum flavum which indents the dorsal thecal sac. Moderate to severe spinal canal stenosis. Bilateral facet arthrosis. Uncovertebral hypertrophy more pronounced on the right. Moderate bilateral foraminal stenosis, greater on the right. C7-T1: Disc bulge eccentric to the left with mild flattening of the ventral cervical cord. Moderate spinal canal stenosis. Bilateral facet arthrosis. Mild-to-moderate bilateral foraminal stenosis. IMPRESSION: No acute abnormality of the cervical spine. 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 Additional moderate to severe spinal canal stenosis at C2-3, C5-6, and C6-7 without significant cord compression or cord signal abnormality. Multilevel foraminal stenosis, greatest and severe on the right at C4-5. Additional moderate foraminal stenosis at multiple levels. Additional degenerative changes as above. Electronically Signed   By: Denny Flack M.D.   On: 10/02/2023 14:10   CT Maxillofacial Wo Contrast Result Date: 10/02/2023 CLINICAL DATA:  Head trauma, fall with trauma to right side of head. Small laceration to right lip. On Plavix  and aspirin . EXAM: CT HEAD WITHOUT CONTRAST CT MAXILLOFACIAL WITHOUT CONTRAST CT CERVICAL SPINE WITHOUT CONTRAST TECHNIQUE: Multidetector CT imaging of the head, cervical spine, and maxillofacial structures were performed using the standard protocol without intravenous contrast. Multiplanar CT image reconstructions of the cervical spine and maxillofacial structures were also generated. RADIATION DOSE REDUCTION: This exam was performed according to the departmental dose-optimization program which  includes automated exposure control, adjustment of the mA and/or kV according to patient size and/or use of iterative reconstruction technique. COMPARISON:  CT head 07/24/2022 FINDINGS: CT HEAD FINDINGS Brain: No acute intracranial hemorrhage. No CT evidence of acute infarct. Nonspecific hypoattenuation in the periventricular and subcortical white matter favored to reflect chronic microvascular ischemic changes. Similar appearance of remote infarct in the left ventral pons. Similar appearance of extra-axial cystic focus in the posterior fossa likely reflecting an arachnoid cyst. No significant mass effect. No midline shift. The basilar cisterns are patent. Ventricles: The ventricles are normal. Vascular: Atherosclerotic calcifications of the carotid siphons and intracranial vertebral arteries. No hyperdense vessel. Skull: No acute or aggressive finding. Other: Mastoid air cells are clear. CT MAXILLOFACIAL FINDINGS Osseous: Maxilla: Intact.  Dental caries.  Multiple periapical lucencies. Pterygoid Plates: Intact Zygomatic Arch: Intact Orbits: Intact Ethmoid: Intact Sphenoid: Intact Frontal:Intact Mandible: Intact. No condylar dislocation. Nasal: Intact Nasal Septum: Intact.  Rightward deviation  of the nasal septum. Orbits: Negative. No traumatic or inflammatory finding. Sinuses: Mucosal thickening in the bilateral ethmoid sinuses, left sphenoid sinus, and bilateral maxillary sinuses. No air-fluid levels. Soft tissues: Mild stranding in the subcutaneous tissues of the right face, likely posttraumatic. Chronic asymmetric atrophy of the left submandibular gland. CT CERVICAL SPINE FINDINGS Alignment: Alignment is maintained. No significant listhesis. No facet subluxation or dislocation. Skull base and vertebrae: No acute fracture. No primary bone lesion or focal pathologic process. Soft tissues and spinal canal: No prevertebral fluid or swelling. No visible canal hematoma. Medial positioning of the left vocal folds with  asymmetric prominence of the laryngeal ventricle on the left. Disc levels: Mild disc space narrowing at multiple levels. Multiple disc bulges throughout the cervical spine. Moderate spinal canal stenosis at C2-3. Moderate to severe spinal canal stenosis at C3-4 possible cord compression. Additional moderate spinal canal stenosis at C4-5 and C5-6. Disc osteophyte complex at C6-7 resulting in moderate spinal canal stenosis. Facet arthrosis and uncovertebral hypertrophy at multiple levels. Upper chest: Negative. Other: None. IMPRESSION: No CT evidence of acute intracranial abnormality. No acute fracture or traumatic malalignment of the cervical spine. No acute maxillofacial fracture. Mild stranding in the right facial subcutaneous tissues, likely posttraumatic. Remote infarct in the left ventral pons again noted. Degenerative changes of the cervical spine as above. Spinal canal narrowing at multiple levels most significant at C3-4 where there is possible cord compression. Consider correlation with MRI cervical spine. 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 Recommend clinical correlation and consider nonemergent direct visualization. Electronically Signed   By: Denny Flack M.D.   On: 10/02/2023 11:59   CT Head Wo Contrast Result Date: 10/02/2023 CLINICAL DATA:  Head trauma, fall with trauma to right side of head. Small laceration to right lip. On Plavix  and aspirin . EXAM: CT HEAD WITHOUT CONTRAST CT MAXILLOFACIAL WITHOUT CONTRAST CT CERVICAL SPINE WITHOUT CONTRAST TECHNIQUE: Multidetector CT imaging of the head, cervical spine, and maxillofacial structures were performed using the standard protocol without intravenous contrast. Multiplanar CT image reconstructions of the cervical spine and maxillofacial structures were also generated. RADIATION DOSE REDUCTION: This exam was performed according to the departmental dose-optimization program which includes automated exposure control, adjustment of the mA  and/or kV according to patient size and/or use of iterative reconstruction technique. COMPARISON:  CT head 07/24/2022 FINDINGS: CT HEAD FINDINGS Brain: No acute intracranial hemorrhage. No CT evidence of acute infarct. Nonspecific hypoattenuation in the periventricular and subcortical white matter favored to reflect chronic microvascular ischemic changes. Similar appearance of remote infarct in the left ventral pons. Similar appearance of extra-axial cystic focus in the posterior fossa likely reflecting an arachnoid cyst. No significant mass effect. No midline shift. The basilar cisterns are patent. Ventricles: The ventricles are normal. Vascular: Atherosclerotic calcifications of the carotid siphons and intracranial vertebral arteries. No hyperdense vessel. Skull: No acute or aggressive finding. Other: Mastoid air cells are clear. CT MAXILLOFACIAL FINDINGS Osseous: Maxilla: Intact.  Dental caries.  Multiple periapical lucencies. Pterygoid Plates: Intact Zygomatic Arch: Intact Orbits: Intact Ethmoid: Intact Sphenoid: Intact Frontal:Intact Mandible: Intact. No condylar dislocation. Nasal: Intact Nasal Septum: Intact.  Rightward deviation of the nasal septum. Orbits: Negative. No traumatic or inflammatory finding. Sinuses: Mucosal thickening in the bilateral ethmoid sinuses, left sphenoid sinus, and bilateral maxillary sinuses. No air-fluid levels. Soft tissues: Mild stranding in the subcutaneous tissues of the right face, likely posttraumatic. Chronic asymmetric atrophy of the left submandibular gland. CT CERVICAL SPINE FINDINGS Alignment: Alignment is maintained. No significant listhesis.  No facet subluxation or dislocation. Skull base and vertebrae: No acute fracture. No primary bone lesion or focal pathologic process. Soft tissues and spinal canal: No prevertebral fluid or swelling. No visible canal hematoma. Medial positioning of the left vocal folds with asymmetric prominence of the laryngeal ventricle on the  left. Disc levels: Mild disc space narrowing at multiple levels. Multiple disc bulges throughout the cervical spine. Moderate spinal canal stenosis at C2-3. Moderate to severe spinal canal stenosis at C3-4 possible cord compression. Additional moderate spinal canal stenosis at C4-5 and C5-6. Disc osteophyte complex at C6-7 resulting in moderate spinal canal stenosis. Facet arthrosis and uncovertebral hypertrophy at multiple levels. Upper chest: Negative. Other: None. IMPRESSION: No CT evidence of acute intracranial abnormality. No acute fracture or traumatic malalignment of the cervical spine. No acute maxillofacial fracture. Mild stranding in the right facial subcutaneous tissues, likely posttraumatic. Remote infarct in the left ventral pons again noted. Degenerative changes of the cervical spine as above. Spinal canal narrowing at multiple levels most significant at C3-4 where there is possible cord compression. Consider correlation with MRI cervical spine. 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 Recommend clinical correlation and consider nonemergent direct visualization. Electronically Signed   By: Denny Flack M.D.   On: 10/02/2023 11:59   CT Cervical Spine Wo Contrast Result Date: 10/02/2023 CLINICAL DATA:  Head trauma, fall with trauma to right side of head. Small laceration to right lip. On Plavix  and aspirin . EXAM: CT HEAD WITHOUT CONTRAST CT MAXILLOFACIAL WITHOUT CONTRAST CT CERVICAL SPINE WITHOUT CONTRAST TECHNIQUE: Multidetector CT imaging of the head, cervical spine, and maxillofacial structures were performed using the standard protocol without intravenous contrast. Multiplanar CT image reconstructions of the cervical spine and maxillofacial structures were also generated. RADIATION DOSE REDUCTION: This exam was performed according to the departmental dose-optimization program which includes automated exposure control, adjustment of the mA and/or kV according to patient size and/or use of  iterative reconstruction technique. COMPARISON:  CT head 07/24/2022 FINDINGS: CT HEAD FINDINGS Brain: No acute intracranial hemorrhage. No CT evidence of acute infarct. Nonspecific hypoattenuation in the periventricular and subcortical white matter favored to reflect chronic microvascular ischemic changes. Similar appearance of remote infarct in the left ventral pons. Similar appearance of extra-axial cystic focus in the posterior fossa likely reflecting an arachnoid cyst. No significant mass effect. No midline shift. The basilar cisterns are patent. Ventricles: The ventricles are normal. Vascular: Atherosclerotic calcifications of the carotid siphons and intracranial vertebral arteries. No hyperdense vessel. Skull: No acute or aggressive finding. Other: Mastoid air cells are clear. CT MAXILLOFACIAL FINDINGS Osseous: Maxilla: Intact.  Dental caries.  Multiple periapical lucencies. Pterygoid Plates: Intact Zygomatic Arch: Intact Orbits: Intact Ethmoid: Intact Sphenoid: Intact Frontal:Intact Mandible: Intact. No condylar dislocation. Nasal: Intact Nasal Septum: Intact.  Rightward deviation of the nasal septum. Orbits: Negative. No traumatic or inflammatory finding. Sinuses: Mucosal thickening in the bilateral ethmoid sinuses, left sphenoid sinus, and bilateral maxillary sinuses. No air-fluid levels. Soft tissues: Mild stranding in the subcutaneous tissues of the right face, likely posttraumatic. Chronic asymmetric atrophy of the left submandibular gland. CT CERVICAL SPINE FINDINGS Alignment: Alignment is maintained. No significant listhesis. No facet subluxation or dislocation. Skull base and vertebrae: No acute fracture. No primary bone lesion or focal pathologic process. Soft tissues and spinal canal: No prevertebral fluid or swelling. No visible canal hematoma. Medial positioning of the left vocal folds with asymmetric prominence of the laryngeal ventricle on the left. Disc levels: Mild disc space narrowing at  multiple levels.  Multiple disc bulges throughout the cervical spine. Moderate spinal canal stenosis at C2-3. Moderate to severe spinal canal stenosis at C3-4 possible cord compression. Additional moderate spinal canal stenosis at C4-5 and C5-6. Disc osteophyte complex at C6-7 resulting in moderate spinal canal stenosis. Facet arthrosis and uncovertebral hypertrophy at multiple levels. Upper chest: Negative. Other: None. IMPRESSION: No CT evidence of acute intracranial abnormality. No acute fracture or traumatic malalignment of the cervical spine. No acute maxillofacial fracture. Mild stranding in the right facial subcutaneous tissues, likely posttraumatic. Remote infarct in the left ventral pons again noted. Degenerative changes of the cervical spine as above. Spinal canal narrowing at multiple levels most significant at C3-4 where there is possible cord compression. Consider correlation with MRI cervical spine. 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 Recommend clinical correlation and consider nonemergent direct visualization. Electronically Signed   By: Denny Flack M.D.   On: 10/02/2023 11:59   DG Chest Portable 1 View Result Date: 10/02/2023 CLINICAL DATA:  fall. EXAM: PORTABLE CHEST 1 VIEW COMPARISON:  11/15/2021. FINDINGS: Bilateral lung fields are clear. Bilateral costophrenic angles are clear. Normal cardio-mediastinal silhouette. No acute osseous abnormalities. The soft tissues are within normal limits. IMPRESSION: No active disease. Electronically Signed   By: Beula Brunswick M.D.   On: 10/02/2023 10:46    Procedures Procedures    Medications Ordered in ED Medications  Tdap (BOOSTRIX ) injection 0.5 mL (0.5 mLs Intramuscular Given 10/02/23 1050)  fentaNYL  (SUBLIMAZE ) injection 50 mcg (50 mcg Intramuscular Given 10/02/23 1050)    ED Course/ Medical Decision Making/ A&P                                 Medical Decision Making  This patient is a 73 y.o. male  who presents to the ED  for concern of fall on thinners, head injury.   Differential diagnoses prior to evaluation: The emergent differential diagnosis includes, but is not limited to,  epidural hematoma, subdural hematoma, skull fracture, subarachnoid hemorrhage, unstable cervical spine fracture, concussion vs other MSK injury  . This is not an exhaustive differential.   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 The pertinent findings include: Spastic hemiparesis of the right side is chronic, stable compared to baseline, he has some right sided facial droop which again is chronic, no new focal neurologic deficits noted.  Moves left upper and lower extremity with normal coordination, answers questions appropriately, alert and oriented x 3.  Head is overall atraumatic, there is 1 area of small abrasion on the upper lip area with no active bleeding at this time.   No focal tenderness of bilateral shoulders, elbows, wrists, hips, knees.  Baseline strength deficit on the right with spastic hemiparesis, intact strength 5/5 with normal coordination of left upper and lower extremity.  Lab Tests/Imaging studies: I personally interpreted labs/imaging and the pertinent results include:  plain film chest xray with no acute intrathoracic abnormality.  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  Consults: Spoke with Neurosurgery, Dr.  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  I have reviewed the patients home medicines and have made adjustments as needed.   Disposition: After consideration of the diagnostic results and the patients response to treatment, I feel that patient stable for discharge with plan for neurosurgery follow up as discussed above .   emergency department workup does not suggest an emergent condition requiring admission or immediate intervention beyond what has been performed at this time. The plan is: as above. The patient is safe for discharge and has been instructed to return immediately for worsening symptoms, change in symptoms or any other concerns.  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

## 2023-10-06 DIAGNOSIS — G9589 Other specified diseases of spinal cord: Secondary | ICD-10-CM | POA: Diagnosis not present

## 2023-10-07 DIAGNOSIS — E119 Type 2 diabetes mellitus without complications: Secondary | ICD-10-CM | POA: Diagnosis not present

## 2023-10-07 DIAGNOSIS — M6281 Muscle weakness (generalized): Secondary | ICD-10-CM | POA: Diagnosis not present

## 2023-10-07 DIAGNOSIS — L97812 Non-pressure chronic ulcer of other part of right lower leg with fat layer exposed: Secondary | ICD-10-CM | POA: Diagnosis not present

## 2023-10-07 DIAGNOSIS — I739 Peripheral vascular disease, unspecified: Secondary | ICD-10-CM | POA: Diagnosis not present

## 2023-10-07 DIAGNOSIS — I69320 Aphasia following cerebral infarction: Secondary | ICD-10-CM | POA: Diagnosis not present

## 2023-10-07 DIAGNOSIS — R278 Other lack of coordination: Secondary | ICD-10-CM | POA: Diagnosis not present

## 2023-10-07 DIAGNOSIS — G8191 Hemiplegia, unspecified affecting right dominant side: Secondary | ICD-10-CM | POA: Diagnosis not present

## 2023-10-08 ENCOUNTER — Ambulatory Visit: Attending: Vascular Surgery | Admitting: Physician Assistant

## 2023-10-08 ENCOUNTER — Ambulatory Visit (HOSPITAL_COMMUNITY)
Admission: RE | Admit: 2023-10-08 | Discharge: 2023-10-08 | Disposition: A | Discharge status: Home / Self Care | Source: Ambulatory Visit | Attending: Vascular Surgery | Admitting: Vascular Surgery

## 2023-10-08 VITALS — BP 209/84 | HR 58 | Temp 98.0°F | Wt 201.9 lb

## 2023-10-08 DIAGNOSIS — I70245 Atherosclerosis of native arteries of left leg with ulceration of other part of foot: Secondary | ICD-10-CM | POA: Diagnosis not present

## 2023-10-08 DIAGNOSIS — I739 Peripheral vascular disease, unspecified: Secondary | ICD-10-CM

## 2023-10-08 NOTE — Progress Notes (Signed)
 Office Note   History of Present Illness   [name] is a 73 y.o. (1950/11/19) male who presents for follow-up.  He recently underwent right lower extremity angiogram with right SFA stenting, SFA drug-coated balloon angioplasty, and anterior tibial artery balloon angioplasty on 08/19/2023 by Dr. Rosalva Comber.  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  He is mostly nonambulatory but can stand and pivot with assistance.  He returns today for follow-up.  He has no complaints at today's visit.  He denies any pain in his right foot.  He says that his facility is taking care of his right leg wound every day.  He thinks that his wound is getting better.  Current Outpatient Medications  Medication Sig Dispense Refill   abiraterone  acetate (ZYTIGA ) 250 MG tablet Take 1,000 mg by mouth daily.     acetaminophen  (TYLENOL ) 500 MG tablet Take 1,000 mg by mouth in the morning, at noon, and at bedtime.     amlodipine -olmesartan  (AZOR ) 10-20 MG tablet TAKE 1 TABLET BY MOUTH EVERY DAY 90 tablet 1   aspirin  EC 81 MG tablet Take 1 tablet (81 mg total) by mouth daily. Swallow whole. 30 tablet 12   atorvastatin  (LIPITOR) 40 MG tablet Take 1 tablet (40 mg total) by mouth daily. 90 tablet 3   ciprofloxacin (CIPRO) 750 MG tablet Take 750 mg by mouth 2 (two) times daily.     clopidogrel  (PLAVIX ) 75 MG tablet Take 1 tablet (75 mg total) by mouth daily. 90 tablet 2   ertapenem (INVANZ) 1 g injection Inject 1 g into the muscle daily.     insulin  glargine (LANTUS  SOLOSTAR) 100 UNIT/ML Solostar Pen Inject 12 Units into the skin daily. (Patient taking differently: Inject 18 Units into the skin daily.) 15 mL 3   JARDIANCE  25 MG TABS tablet Take 25 mg by mouth daily.     meloxicam (MOBIC) 7.5 MG tablet Take 7.5 mg by mouth daily.     metFORMIN  (GLUCOPHAGE ) 1000 MG tablet Take 1 tablet (1,000 mg total) by mouth  2 (two) times daily with a meal. 180 tablet 3   pantoprazole  (PROTONIX ) 20 MG tablet TAKE 1 TABLET BY MOUTH EVERY DAY 90 tablet 1   predniSONE  (DELTASONE ) 5 MG tablet Take 5 mg by mouth daily.     promethazine (PHENERGAN) 12.5 MG tablet Take 12.5 mg by mouth every 6 (six) hours as needed for vomiting, refractory nausea / vomiting or nausea.     sertraline  (ZOLOFT ) 100 MG tablet Take 1 tablet (100 mg total) by mouth daily. 90 tablet 2   tamsulosin  (FLOMAX ) 0.4 MG CAPS capsule Take 0.4 mg by mouth daily.     tiZANidine  (ZANAFLEX ) 2 MG tablet Take 2 mg by mouth in the morning, at noon, and at bedtime.     carvedilol  (COREG ) 12.5 MG tablet Take 1 tablet (12.5 mg total) by mouth 2 (two) times daily with a meal. 180 tablet 3   No current facility-administered medications for this visit.    REVIEW OF SYSTEMS (negative unless checked):   Cardiac:  []  Chest pain or chest pressure? []  Shortness of breath upon activity? []  Shortness of breath when lying flat? []  Irregular heart rhythm?  Vascular:  []  Pain in calf, thigh, or hip brought on by walking? []  Pain in feet at night that wakes you up from your sleep? []  Blood  clot in your veins? [x]  Leg swelling?  Pulmonary:  []  Oxygen at home? []  Productive cough? []  Wheezing?  Neurologic:  []  Sudden weakness in arms or legs? []  Sudden numbness in arms or legs? []  Sudden onset of difficult speaking or slurred speech? []  Temporary loss of vision in one eye? []  Problems with dizziness?  Gastrointestinal:  []  Blood in stool? []  Vomited blood?  Genitourinary:  []  Burning when urinating? []  Blood in urine?  Psychiatric:  []  Major depression  Hematologic:  []  Bleeding problems? []  Problems with blood clotting?  Dermatologic:  []  Rashes or ulcers?  Constitutional:  []  Fever or chills?  Ear/Nose/Throat:  []  Change in hearing? []  Nose bleeds? []  Sore throat?  Musculoskeletal:  []  Back pain? []  Joint pain? []  Muscle  pain?   Physical Examination    Vitals:   10/08/23 0941  BP: (!) 209/84  Pulse: (!) 58  Temp: 98 F (36.7 C)  TempSrc: Temporal  SpO2: 98%  Weight: 201 lb 14.4 oz (91.6 kg)   Body mass index is 31.62 kg/m.  General:  WDWN in NAD; in wheelchair Gait: Not observed HENT: WNL, normocephalic Pulmonary: normal non-labored breathing , without rales, rhonchi,  wheezing Cardiac: Regular Abdomen: soft, NT, no masses Skin: without rashes Vascular Exam/Pulses: Brisk right DP Doppler signal Extremities: Healthy appearance of right medial lower leg wound.  Healed right foot wounds Musculoskeletal: no muscle wasting or atrophy  Neurologic: A&O X 3;  No focal weakness or paresthesias are detected Psychiatric:  The pt has Normal affect.    Non-Invasive Vascular imaging   ABI (10/08/2023) R:  ABI: 1.00 ([place]),  PT: mono DP: mono TBI:  0.43 L:  ABI: 1.00 ([place]),  PT: mono DP: mono TBI: 0.25   RLE Arterial Duplex (10/08/2023) Patent arterial flow in the right lower extremity without hemodynamically significant stenosis.  Patent right SFA stent  Medical Decision Making   [name] is a 73 y.o. male who presents for post angio visit  Based on the patient's vascular studies, his ABIs are 1.00 bilaterally.  His great toe pressure on the right has significantly increased from 31 mmHg to 110 mmHg Arterial duplex of the right lower extremity demonstrates a patent SFA stent without hemodynamically significant stenosis He denies any rest pain or worsening tissue loss.  His right foot wounds have healed.  On exam he has a healthy appearance to his right medial lower leg wound. He has a brisk right DP Doppler signal I have recommended that his facility continue local wound care to the right lower leg wound.  Hopefully he can continue to heal this area without further intervention.  He can follow-up with our office in 3 months with repeat right lower extremity arterial duplex and  ABIs   Deneise Finlay PA-C [facility] Office: [phone]  Call MD: Edgardo Goodwill

## 2023-10-09 ENCOUNTER — Observation Stay (HOSPITAL_COMMUNITY)
Admission: EM | Admit: 2023-10-09 | Discharge: 2023-10-12 | Disposition: A | Discharge status: Skilled Nursing Facility | Attending: Internal Medicine | Admitting: Internal Medicine

## 2023-10-09 ENCOUNTER — Encounter (HOSPITAL_COMMUNITY): Payer: Self-pay

## 2023-10-09 ENCOUNTER — Emergency Department (HOSPITAL_COMMUNITY)

## 2023-10-09 ENCOUNTER — Encounter (HOSPITAL_COMMUNITY)
Admission: RE | Admit: 2023-10-09 | Discharge: 2023-10-09 | Disposition: A | Discharge status: Home / Self Care | Source: Ambulatory Visit | Attending: Urology | Admitting: Urology

## 2023-10-09 ENCOUNTER — Other Ambulatory Visit: Payer: Self-pay

## 2023-10-09 DIAGNOSIS — R41841 Cognitive communication deficit: Secondary | ICD-10-CM | POA: Diagnosis not present

## 2023-10-09 DIAGNOSIS — I959 Hypotension, unspecified: Secondary | ICD-10-CM | POA: Diagnosis not present

## 2023-10-09 DIAGNOSIS — C61 Malignant neoplasm of prostate: Secondary | ICD-10-CM | POA: Insufficient documentation

## 2023-10-09 DIAGNOSIS — I739 Peripheral vascular disease, unspecified: Secondary | ICD-10-CM | POA: Diagnosis present

## 2023-10-09 DIAGNOSIS — I10 Essential (primary) hypertension: Secondary | ICD-10-CM | POA: Diagnosis not present

## 2023-10-09 DIAGNOSIS — R7401 Elevation of levels of liver transaminase levels: Secondary | ICD-10-CM | POA: Insufficient documentation

## 2023-10-09 DIAGNOSIS — R29898 Other symptoms and signs involving the musculoskeletal system: Secondary | ICD-10-CM | POA: Insufficient documentation

## 2023-10-09 DIAGNOSIS — E876 Hypokalemia: Secondary | ICD-10-CM | POA: Diagnosis not present

## 2023-10-09 DIAGNOSIS — C775 Secondary and unspecified malignant neoplasm of intrapelvic lymph nodes: Secondary | ICD-10-CM | POA: Diagnosis not present

## 2023-10-09 DIAGNOSIS — Z8546 Personal history of malignant neoplasm of prostate: Secondary | ICD-10-CM | POA: Diagnosis not present

## 2023-10-09 DIAGNOSIS — Z79899 Other long term (current) drug therapy: Secondary | ICD-10-CM | POA: Diagnosis not present

## 2023-10-09 DIAGNOSIS — Z7902 Long term (current) use of antithrombotics/antiplatelets: Secondary | ICD-10-CM | POA: Insufficient documentation

## 2023-10-09 DIAGNOSIS — R001 Bradycardia, unspecified: Secondary | ICD-10-CM | POA: Insufficient documentation

## 2023-10-09 DIAGNOSIS — C770 Secondary and unspecified malignant neoplasm of lymph nodes of head, face and neck: Secondary | ICD-10-CM | POA: Diagnosis not present

## 2023-10-09 DIAGNOSIS — Z8673 Personal history of transient ischemic attack (TIA), and cerebral infarction without residual deficits: Secondary | ICD-10-CM | POA: Diagnosis not present

## 2023-10-09 DIAGNOSIS — E119 Type 2 diabetes mellitus without complications: Secondary | ICD-10-CM

## 2023-10-09 DIAGNOSIS — I5032 Chronic diastolic (congestive) heart failure: Secondary | ICD-10-CM | POA: Diagnosis not present

## 2023-10-09 DIAGNOSIS — Z7982 Long term (current) use of aspirin: Secondary | ICD-10-CM | POA: Diagnosis not present

## 2023-10-09 DIAGNOSIS — R4182 Altered mental status, unspecified: Secondary | ICD-10-CM | POA: Diagnosis present

## 2023-10-09 DIAGNOSIS — R262 Difficulty in walking, not elsewhere classified: Secondary | ICD-10-CM | POA: Diagnosis not present

## 2023-10-09 DIAGNOSIS — R531 Weakness: Secondary | ICD-10-CM | POA: Diagnosis not present

## 2023-10-09 DIAGNOSIS — I950 Idiopathic hypotension: Secondary | ICD-10-CM | POA: Diagnosis not present

## 2023-10-09 DIAGNOSIS — G9341 Metabolic encephalopathy: Secondary | ICD-10-CM | POA: Diagnosis not present

## 2023-10-09 DIAGNOSIS — G9340 Encephalopathy, unspecified: Secondary | ICD-10-CM | POA: Diagnosis present

## 2023-10-09 DIAGNOSIS — R7989 Other specified abnormal findings of blood chemistry: Secondary | ICD-10-CM

## 2023-10-09 DIAGNOSIS — Z955 Presence of coronary angioplasty implant and graft: Secondary | ICD-10-CM | POA: Diagnosis not present

## 2023-10-09 DIAGNOSIS — R2689 Other abnormalities of gait and mobility: Secondary | ICD-10-CM | POA: Diagnosis not present

## 2023-10-09 DIAGNOSIS — Z86718 Personal history of other venous thrombosis and embolism: Secondary | ICD-10-CM | POA: Diagnosis not present

## 2023-10-09 DIAGNOSIS — R5383 Other fatigue: Secondary | ICD-10-CM | POA: Diagnosis not present

## 2023-10-09 DIAGNOSIS — Z794 Long term (current) use of insulin: Secondary | ICD-10-CM | POA: Insufficient documentation

## 2023-10-09 LAB — CBG MONITORING, ED
Glucose-Capillary: 128 mg/dL — ABNORMAL HIGH (ref 70–99)
Glucose-Capillary: 81 mg/dL (ref 70–99)

## 2023-10-09 LAB — CBC WITH DIFFERENTIAL/PLATELET
Abs Immature Granulocytes: 0.04 10*3/uL (ref 0.00–0.07)
Basophils Absolute: 0.1 10*3/uL (ref 0.0–0.1)
Basophils Relative: 1 %
Eosinophils Absolute: 0.1 10*3/uL (ref 0.0–0.5)
Eosinophils Relative: 1 %
HCT: 36.4 % — ABNORMAL LOW (ref 39.0–52.0)
Hemoglobin: 11 g/dL — ABNORMAL LOW (ref 13.0–17.0)
Immature Granulocytes: 0 %
Lymphocytes Relative: 15 %
Lymphs Abs: 1.5 10*3/uL (ref 0.7–4.0)
MCH: 29.3 pg (ref 26.0–34.0)
MCHC: 30.2 g/dL (ref 30.0–36.0)
MCV: 96.8 fL (ref 80.0–100.0)
Monocytes Absolute: 0.5 10*3/uL (ref 0.1–1.0)
Monocytes Relative: 5 %
Neutro Abs: 8.2 10*3/uL — ABNORMAL HIGH (ref 1.7–7.7)
Neutrophils Relative %: 78 %
Platelets: 186 10*3/uL (ref 150–400)
RBC: 3.76 MIL/uL — ABNORMAL LOW (ref 4.22–5.81)
RDW: 13.2 % (ref 11.5–15.5)
WBC: 10.5 10*3/uL (ref 4.0–10.5)
nRBC: 0 % (ref 0.0–0.2)

## 2023-10-09 LAB — TROPONIN I (HIGH SENSITIVITY)
Troponin I (High Sensitivity): 6 ng/L (ref <18)
Troponin I (High Sensitivity): 2 ng/L (ref <18)

## 2023-10-09 LAB — COMPREHENSIVE METABOLIC PANEL WITH GFR
ALT: 13 U/L (ref 0–44)
AST: 16 U/L (ref 15–41)
Albumin: 3.2 g/dL — ABNORMAL LOW (ref 3.5–5.0)
Alkaline Phosphatase: 62 U/L (ref 38–126)
Anion gap: 9 (ref 5–15)
BUN: 25 mg/dL — ABNORMAL HIGH (ref 8–23)
CO2: 24 mmol/L (ref 22–32)
Calcium: 8.8 mg/dL — ABNORMAL LOW (ref 8.9–10.3)
Chloride: 106 mmol/L (ref 98–111)
Creatinine, Ser: 1.1 mg/dL (ref 0.61–1.24)
GFR, Estimated: >60 mL/min (ref >60)
Glucose, Bld: 123 mg/dL — ABNORMAL HIGH (ref 70–99)
Potassium: 4.3 mmol/L (ref 3.5–5.1)
Sodium: 139 mmol/L (ref 135–145)
Total Bilirubin: 0.9 mg/dL (ref 0.0–1.2)
Total Protein: 6.1 g/dL — ABNORMAL LOW (ref 6.5–8.1)

## 2023-10-09 LAB — I-STAT CG4 LACTIC ACID, ED
Lactic Acid, Venous: 3 mmol/L (ref 0.5–1.9)
Lactic Acid, Venous: 0.5 mmol/L (ref 0.5–1.9)

## 2023-10-09 LAB — GLUCOSE, CAPILLARY: Glucose-Capillary: 122 mg/dL — ABNORMAL HIGH (ref 70–99)

## 2023-10-09 LAB — MAGNESIUM: Magnesium: 1.5 mg/dL — ABNORMAL LOW (ref 1.7–2.4)

## 2023-10-09 LAB — AMMONIA: Ammonia: 13 umol/L (ref 9–35)

## 2023-10-09 MED ORDER — INSULIN GLARGINE-YFGN 100 UNIT/ML ~~LOC~~ SOLN
12.0000 [IU] | Freq: Every day | SUBCUTANEOUS | Status: DC
  Administered 2023-10-10 – 2023-10-12 (×3): 12 [IU] via SUBCUTANEOUS
  Filled 2023-10-09 (×3): qty 0.12

## 2023-10-09 MED ORDER — ACETAMINOPHEN 325 MG PO TABS: 650.0000 mg | ORAL_TABLET | Freq: Four times a day (QID) | ORAL | Status: DC | PRN

## 2023-10-09 MED ORDER — METRONIDAZOLE 500 MG/100ML IV SOLN
500.0000 mg | Freq: Once | INTRAVENOUS | Status: AC
  Administered 2023-10-09: 500 mg via INTRAVENOUS
  Filled 2023-10-09: qty 100

## 2023-10-09 MED ORDER — SODIUM CHLORIDE 0.9% FLUSH: 3.0000 mL | INTRAVENOUS | Status: DC | PRN

## 2023-10-09 MED ORDER — CLOPIDOGREL BISULFATE 75 MG PO TABS
75.0000 mg | ORAL_TABLET | Freq: Every day | ORAL | Status: DC
  Administered 2023-10-10 – 2023-10-12 (×3): 75 mg via ORAL
  Filled 2023-10-09 (×3): qty 1

## 2023-10-09 MED ORDER — VANCOMYCIN HCL IN DEXTROSE 1-5 GM/200ML-% IV SOLN
1000.0000 mg | Freq: Once | INTRAVENOUS | Status: AC
  Administered 2023-10-09: 1000 mg via INTRAVENOUS
  Filled 2023-10-09: qty 200

## 2023-10-09 MED ORDER — SODIUM CHLORIDE 0.9 % IV BOLUS
1000.0000 mL | Freq: Once | INTRAVENOUS | Status: AC
  Administered 2023-10-09: 1000 mL via INTRAVENOUS

## 2023-10-09 MED ORDER — ACETAMINOPHEN 650 MG RE SUPP: 650.0000 mg | Freq: Four times a day (QID) | RECTAL | Status: DC | PRN

## 2023-10-09 MED ORDER — LACTATED RINGERS IV BOLUS (SEPSIS)
1000.0000 mL | Freq: Once | INTRAVENOUS | Status: AC
  Administered 2023-10-09: 1000 mL via INTRAVENOUS

## 2023-10-09 MED ORDER — ONDANSETRON HCL 4 MG/2ML IJ SOLN: 4.0000 mg | Freq: Four times a day (QID) | INTRAMUSCULAR | Status: DC | PRN

## 2023-10-09 MED ORDER — ASPIRIN 81 MG PO TBEC
81.0000 mg | DELAYED_RELEASE_TABLET | Freq: Every day | ORAL | Status: DC
  Administered 2023-10-10 – 2023-10-12 (×3): 81 mg via ORAL
  Filled 2023-10-09 (×3): qty 1

## 2023-10-09 MED ORDER — LACTATED RINGERS IV SOLN: INTRAVENOUS | Status: DC

## 2023-10-09 MED ORDER — ONDANSETRON HCL 4 MG PO TABS: 4.0000 mg | ORAL_TABLET | Freq: Four times a day (QID) | ORAL | Status: DC | PRN

## 2023-10-09 MED ORDER — LACTATED RINGERS IV BOLUS (SEPSIS): 1000.0000 mL | Freq: Once | INTRAVENOUS | Status: DC

## 2023-10-09 MED ORDER — ATORVASTATIN CALCIUM 40 MG PO TABS
40.0000 mg | ORAL_TABLET | Freq: Every day | ORAL | Status: DC
  Administered 2023-10-10 – 2023-10-12 (×3): 40 mg via ORAL
  Filled 2023-10-09 (×3): qty 1

## 2023-10-09 MED ORDER — SODIUM CHLORIDE 0.9% FLUSH
3.0000 mL | Freq: Two times a day (BID) | INTRAVENOUS | Status: DC
  Administered 2023-10-10: 3 mL via INTRAVENOUS

## 2023-10-09 MED ORDER — NALOXONE HCL 0.4 MG/ML IJ SOLN
0.4000 mg | Freq: Once | INTRAMUSCULAR | Status: DC
  Filled 2023-10-09: qty 1

## 2023-10-09 MED ORDER — INSULIN ASPART 100 UNIT/ML IJ SOLN
0.0000 [IU] | Freq: Three times a day (TID) | INTRAMUSCULAR | Status: DC
  Administered 2023-10-10: 2 [IU] via SUBCUTANEOUS
  Administered 2023-10-10: 1 [IU] via SUBCUTANEOUS
  Administered 2023-10-11: 2 [IU] via SUBCUTANEOUS
  Filled 2023-10-09: qty 0.06

## 2023-10-09 MED ORDER — SODIUM CHLORIDE 0.9 % IV SOLN
2.0000 g | Freq: Once | INTRAVENOUS | Status: AC
  Administered 2023-10-09: 2 g via INTRAVENOUS
  Filled 2023-10-09: qty 12.5

## 2023-10-09 MED ORDER — INSULIN ASPART 100 UNIT/ML IJ SOLN
0.0000 [IU] | Freq: Every day | INTRAMUSCULAR | Status: DC
  Administered 2023-10-11: 2 [IU] via SUBCUTANEOUS
  Filled 2023-10-09: qty 0.05

## 2023-10-09 MED ORDER — LACTATED RINGERS IV SOLN
INTRAVENOUS | Status: DC
Start: 2023-10-09 — End: 2023-10-10

## 2023-10-09 MED ORDER — FLOTUFOLASTAT F 18 GALLIUM 296-5846 MBQ/ML IV SOLN
7.6200 | Freq: Once | INTRAVENOUS | Status: AC
  Administered 2023-10-09: 7.62 via INTRAVENOUS

## 2023-10-09 MED ORDER — PANTOPRAZOLE SODIUM 20 MG PO TBEC
20.0000 mg | DELAYED_RELEASE_TABLET | Freq: Every day | ORAL | Status: DC
  Administered 2023-10-10 – 2023-10-12 (×3): 20 mg via ORAL
  Filled 2023-10-09 (×3): qty 1

## 2023-10-09 MED ORDER — SODIUM CHLORIDE 0.9 % IV SOLN: 250.0000 mL | INTRAVENOUS | Status: DC | PRN

## 2023-10-09 MED ORDER — MAGNESIUM SULFATE 2 GM/50ML IV SOLN
2.0000 g | Freq: Once | INTRAVENOUS | Status: AC
  Administered 2023-10-09: 2 g via INTRAVENOUS
  Filled 2023-10-09: qty 50

## 2023-10-09 MED ORDER — TAMSULOSIN HCL 0.4 MG PO CAPS
0.4000 mg | ORAL_CAPSULE | Freq: Every day | ORAL | Status: DC
Start: 2023-10-10 — End: 2023-10-12
  Administered 2023-10-10 – 2023-10-12 (×3): 0.4 mg via ORAL
  Filled 2023-10-09 (×3): qty 1

## 2023-10-09 NOTE — H&P (Addendum)
 History and Physical    [name] AYT:[id number] DOB: Feb 22, 1951 DOA: 10/09/2023  PCP: Naida Austria, MD   Patient coming from: SNF   Chief Complaint:  Chief Complaint  Patient presents with   Hypotension   Possible Stroke   Bradycardia   ED TRIAGE note:  Pt BIB [facility] outpatient due to stroke like symptoms; pt more delayed in responses, pt lethargic, and hypotensive at 94/45. Staff reports pt was AAOx4 on arrival to ED. Pt last normal was around 3pm. Pt has a Hx of strokes, aphasia. Baseline left sided weakness.            HPI:  [name] is a 73 y.o. 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  At presentation to ED patient is drowsy.  Patient was brought to the ED from [facility] over there patient had CT scan and has transferred to ED for nuclear medicine scan?  During my evaluation at bedside patient is alert oriented x 3.  Physical exam showed patient has residual right-sided weakness, and verified with patient he has chronic dysarthria.  However denies any problems swallowing solid food and in fact he was eating sandwich without any hiccup and swallowing problem.  Patient reported that since in the afternoon he he felt tired and drowsy.  Denies any loss of consciousness, new weakness, dizziness, headache, tingling, tremor, seizure and loss of consciousness.  Patient denies any chest pain, palpitation, headache or blurry vision.   ED Course:  At presentation to ED patient found bradycardic heart rate in between 49-88, initial blood pressure was soft 107/49 improved to 150/68 otherwise unremarkable. Normal blood glucose  level. CBC unremarkable stable H&H normal WBC and platelet count. CMP unremarkable.  Low mag 1.5. Pending ammonia. Pending UA and blood culture. Troponin within normal range. Elevated lactic acid 3 which has been improved to 0.5.  EKG showing sinus bradycardia heart rate 50.  CT head no acute endocrine abnormality.  Remote infarction of the left ventral pons. Chest x-ray no acute disease process.  Due to elevated lactic acid in the ED code sepsis has been activated, patient has been treated with LR for liter of bolus, IV vancomycin, cefepime and metronidazole .  Currently on maintenance fluid  Pending nuclear medicine PET scan.  Not clearly sure why it has been obtained.  Per ED physician it is not clear patient's baseline mental status however while in the ED patient is more alert and oriented and at this time there is no concern for sepsis and infection as patient is neither hypotensive and there is no source of infection identified.  Release has been consulted for further evaluation management of altered mental status/acute metabolic encephalopathy.    Significant labs in the ED: Lab Orders         Culture, blood (Routine X 2) w Reflex to ID Panel         CBC with Differential         Comprehensive metabolic panel         Urinalysis, Routine w reflex microscopic -Urine, Catheterized; Indwelling urinary catheter         Ammonia         Magnesium  CBC         Comprehensive metabolic panel         Hemoglobin A1c         CBG monitoring, ED         I-Stat Lactic Acid         CBG monitoring, ED       Review of Systems:  Review of Systems  Constitutional:  Negative for chills, fever, malaise/fatigue and weight loss.  HENT:  Negative for hearing loss.   Eyes:  Negative for blurred vision and double vision.  Respiratory:  Negative for cough and sputum production.   Cardiovascular:  Negative for chest pain and palpitations.  Gastrointestinal:  Negative for heartburn and  nausea.  Musculoskeletal:  Negative for joint pain and myalgias.  Neurological:  Positive for focal weakness. Negative for dizziness, tingling, tremors, sensory change, speech change, seizures, loss of consciousness, weakness and headaches.       Chronic right-sided upper extremity contracture  Psychiatric/Behavioral:  The patient is not nervous/anxious.     Past Medical History:  Diagnosis Date   CVA (cerebral infarction) 01/03/2008   MRI: Acute 1 x 1.5 cm infarction affecting the left side of the pons.   Diabetes mellitus    DVT (deep venous thrombosis) (HCC)    Hypertension    PAD (peripheral artery disease) (HCC)    Stroke (HCC)    2008    Past Surgical History:  Procedure Laterality Date   ABDOMINAL AORTOGRAM W/LOWER EXTREMITY Right 08/19/2023   Procedure: ABDOMINAL AORTOGRAM W/LOWER EXTREMITY;  Surgeon: Kayla Part, MD;  Location[facility] INVASIVE CV LAB;  Service: Cardiovascular;  Laterality: Right;   COLONOSCOPY     LOWER EXTREMITY ANGIOGRAPHY Right 08/19/2023   Procedure: Lower Extremity Angiography;  Surgeon: Kayla Part, MD;  Location: [facility] INVASIVE CV LAB;  Service: Cardiovascular;  Laterality: Right;   LOWER EXTREMITY INTERVENTION Right 08/19/2023   Procedure: LOWER EXTREMITY INTERVENTION;  Surgeon: Kayla Part, MD;  Location: [facility] INVASIVE CV LAB;  Service: Cardiovascular;  Laterality: Right;   None       reports that he has never smoked. He has never used smokeless tobacco. He reports that he does not drink alcohol and does not use drugs.  Allergies  Allergen Reactions   Pork Allergy Other (See Comments)    Raises Blood pressure per report   Shrimp Flavor Agent (Non-Screening) Other (See Comments)    Doesn't like it    Family History  Problem Relation Age of Onset   Diabetes Mother    Heart attack Mother    Hypertension Mother    Diabetes Sister    Hypertension Sister    Stroke Sister        2017   Hypertension Brother    Hypertension Daughter     Hypertension Son    Colon cancer Neg Hx    Esophageal cancer Neg Hx    Stomach cancer Neg Hx    Rectal cancer Neg Hx     Prior to Admission medications   Medication Sig Start Date End Date Taking? Authorizing Provider  abiraterone  acetate (ZYTIGA ) 250 MG tablet Take 1,000 mg by mouth daily. 09/12/22   [provider]  acetaminophen  (TYLENOL ) 500 MG tablet Take 1,000 mg by mouth in the morning, at noon, and at bedtime.    [provider]  amlodipine -olmesartan  (AZOR ) 10-20 MG tablet TAKE 1 TABLET BY MOUTH EVERY DAY 04/06/23   Hindel, Jim Motts, MD  aspirin  EC 81 MG tablet  Take 1 tablet (81 mg total) by mouth daily. Swallow whole. 08/13/23   Robins, Joshua E, MD  atorvastatin  (LIPITOR) 40 MG tablet Take 1 tablet (40 mg total) by mouth daily. 08/04/22   Ganta, Anupa, DO  carvedilol  (COREG ) 12.5 MG tablet Take 1 tablet (12.5 mg total) by mouth 2 (two) times daily with a meal. 03/18/23 10/02/23  Hindel, Jim Motts, MD  ciprofloxacin (CIPRO) 750 MG tablet Take 750 mg by mouth 2 (two) times daily. 09/13/23 10/20/23  [provider]  clopidogrel  (PLAVIX ) 75 MG tablet Take 1 tablet (75 mg total) by mouth daily. 02/05/23   Hindel, Jim Motts, MD  ertapenem (INVANZ) 1 g injection Inject 1 g into the muscle daily. 09/13/23   [provider]  insulin  glargine (LANTUS  SOLOSTAR) 100 UNIT/ML Solostar Pen Inject 12 Units into the skin daily. Patient taking differently: Inject 18 Units into the skin daily. 02/19/23   Hindel, Jim Motts, MD  JARDIANCE  25 MG TABS tablet Take 25 mg by mouth daily. 09/28/23   [provider]  meloxicam (MOBIC) 7.5 MG tablet Take 7.5 mg by mouth daily. 07/24/23   [provider]  metFORMIN  (GLUCOPHAGE ) 1000 MG tablet Take 1 tablet (1,000 mg total) by mouth 2 (two) times daily with a meal. 02/17/23   Hindel, Leah, MD  pantoprazole  (PROTONIX ) 20 MG tablet TAKE 1 TABLET BY MOUTH EVERY DAY 04/27/23   Hindel, Leah, MD  predniSONE  (DELTASONE ) 5 MG tablet Take 5 mg by mouth  daily. 09/12/22   [provider]  promethazine (PHENERGAN) 12.5 MG tablet Take 12.5 mg by mouth every 6 (six) hours as needed for vomiting, refractory nausea / vomiting or nausea. 09/20/23   [provider]  sertraline  (ZOLOFT ) 100 MG tablet Take 1 tablet (100 mg total) by mouth daily. 01/02/23   Naida Austria, MD  tamsulosin  (FLOMAX ) 0.4 MG CAPS capsule Take 0.4 mg by mouth daily. 05/07/23   [provider]  tiZANidine  (ZANAFLEX ) 2 MG tablet Take 2 mg by mouth in the morning, at noon, and at bedtime. 08/11/23   [provider]     Physical Exam: Vitals:   10/09/23 2015 10/09/23 2030 10/09/23 2100 10/09/23 2130  BP: (!) 161/61 (!) 153/69 (!) 150/61 (!) 153/89  Pulse: 74 (!) 57 62 63  Resp: (!) 21 18 18 14   Temp:      TempSrc:      SpO2: 100% 100% 100% 100%    Physical Exam Vitals and nursing note reviewed.  Constitutional:      General: He is not in acute distress.    Appearance: He is obese. He is not ill-appearing.  HENT:     Mouth/Throat:     Mouth: Mucous membranes are moist.  Eyes:     Pupils: Pupils are equal, round, and reactive to light.  Cardiovascular:     Rate and Rhythm: Normal rate and regular rhythm.     Pulses: Normal pulses.     Heart sounds: Normal heart sounds.  Pulmonary:     Effort: Pulmonary effort is normal.     Breath sounds: Normal breath sounds.  Abdominal:     General: Bowel sounds are normal.  Musculoskeletal:        General: No tenderness or deformity.     Cervical back: Neck supple.     Right lower leg: No edema.     Left lower leg: No edema.  Skin:    Capillary Refill: Capillary refill takes less than 2 seconds.  Neurological:  Mental Status: He is alert. He is disoriented.     Cranial Nerves: No cranial nerve deficit.     Sensory: No sensory deficit.     Motor: Weakness present.     Comments: Chronic Right upper extremity contracture and deficient  Psychiatric:        Mood and Affect: Mood normal.         Behavior: Behavior normal.        Thought Content: Thought content normal.        Judgment: Judgment normal.      Labs on Admission: I have personally reviewed following labs and imaging studies  CBC: Recent Labs  Lab 10/09/23 1615  WBC 10.5  NEUTROABS 8.2*  HGB 11.0*  HCT 36.4*  MCV 96.8  PLT 186   Basic Metabolic Panel: Recent Labs  Lab 10/09/23 1615  NA 139  K 4.3  CL 106  CO2 24  GLUCOSE 123*  BUN 25*  CREATININE 1.10  CALCIUM  8.8*  MG 1.5*   GFR: Estimated Creatinine Clearance: 65.5 mL/min (by C-G formula based on SCr of 1.1 mg/dL). Liver Function Tests: Recent Labs  Lab 10/09/23 1615  AST 16  ALT 13  ALKPHOS 62  BILITOT 0.9  PROT 6.1*  ALBUMIN 3.2*   No results for input(s): "LIPASE", "AMYLASE" in the last 168 hours. No results for input(s): "AMMONIA" in the last 168 hours. Coagulation Profile: No results for input(s): "INR", "PROTIME" in the last 168 hours. Cardiac Enzymes: Recent Labs  Lab 10/09/23 1615 10/09/23 1942  TROPONINIHS 6 2   BNP (last 3 results) No results for input(s): "BNP" in the last 8760 hours. HbA1C: No results for input(s): "HGBA1C" in the last 72 hours. CBG: Recent Labs  Lab 10/09/23 1457 10/09/23 1611 10/09/23 2129  GLUCAP 122* 128* 81   Lipid Profile: No results for input(s): "CHOL", "HDL", "LDLCALC", "TRIG", "CHOLHDL", "LDLDIRECT" in the last 72 hours. Thyroid  Function Tests: No results for input(s): "TSH", "T4TOTAL", "FREET4", "T3FREE", "THYROIDAB" in the last 72 hours. Anemia Panel: No results for input(s): "VITAMINB12", "FOLATE", "FERRITIN", "TIBC", "IRON", "RETICCTPCT" in the last 72 hours. Urine analysis:    Component Value Date/Time   COLORURINE STRAW (A) 11/19/2022 1228   APPEARANCEUR HAZY (A) 11/19/2022 1228   LABSPEC 1.010 11/19/2022 1228   PHURINE 6.0 11/19/2022 1228   GLUCOSEU >=500 (A) 11/19/2022 1228   HGBUR MODERATE (A) 11/19/2022 1228   BILIRUBINUR NEGATIVE 11/19/2022 1228    KETONESUR NEGATIVE 11/19/2022 1228   PROTEINUR 30 (A) 11/19/2022 1228   UROBILINOGEN 1.0 01/09/2008 0010   NITRITE NEGATIVE 11/19/2022 1228   LEUKOCYTESUR MODERATE (A) 11/19/2022 1228    Radiological Exams on Admission: I have personally reviewed images CT Head Wo Contrast Result Date: 10/09/2023 CLINICAL DATA:  Mental status change, delayed responses, stroke-like symptoms. EXAM: CT HEAD WITHOUT CONTRAST TECHNIQUE: Contiguous axial images were obtained from the base of the skull through the vertex without intravenous contrast. RADIATION DOSE REDUCTION: This exam was performed according to the departmental dose-optimization program which includes automated exposure control, adjustment of the mA and/or kV according to patient size and/or use of iterative reconstruction technique. COMPARISON:  MRI head 06/03/2017. FINDINGS: Brain: No acute intracranial hemorrhage. Redemonstrated remote infarct in the left aspect of the pons. Nonspecific hypoattenuation in the periventricular and subcortical white matter favored to reflect chronic microvascular ischemic changes. Mild parenchymal volume loss. No edema, mass effect, or midline shift. The basilar cisterns are patent. Redemonstrated posterior fossa fluid collection suggestive of arachnoid cyst. Ventricles: Prominence  of the ventricles suggesting underlying parenchymal volume loss. Vascular: Atherosclerotic calcifications of the carotid siphons. No hyperdense vessel. Skull: No acute or aggressive finding. Orbits: Orbits are symmetric. Sinuses: Mucosal thickening in the visualized paranasal sinuses most pronounced in the ethmoid sinuses and partially visualized maxillary sinuses. Other: Mastoid air cells are clear. IMPRESSION: No CT evidence of acute intracranial abnormality. Redemonstrated remote infarct in the left ventral pons. Chronic microvascular ischemic changes and mild parenchymal volume loss. Electronically Signed   By: Denny Flack M.D.   On: 10/09/2023  19:45   DG Chest Port 1 View Result Date: 10/09/2023 CLINICAL DATA:  Weakness.  Lethargy. EXAM: PORTABLE CHEST 1 VIEW COMPARISON:  Oct 02, 2023. FINDINGS: The heart size and mediastinal contours are within normal limits. Both lungs are clear. The visualized skeletal structures are unremarkable. IMPRESSION: No active disease. Electronically Signed   By: Rosalene Colon M.D.   On: 10/09/2023 17:53   VAS US  LOWER EXTREMITY ARTERIAL DUPLEX Result Date: 10/09/2023 LOWER EXTREMITY ARTERIAL DUPLEX STUDY Patient Name:  SEVERUS AGUIAR  Date of Exam:   10/08/2023 Medical Rec #: [id number]         Accession #:    [id number] Date of Birth: 1950-09-14         Patient Gender: M Patient Age:   76 years Exam Location:  [address] Procedure:      VAS US  LOWER EXTREMITY ARTERIAL DUPLEX Referring Phys: JOSHUA ROBINS --------------------------------------------------------------------------------  High Risk Factors: Prior CVA.  Vascular Interventions: 02/13/24: Angioplasty and stent right SFA.                         Angioplasty right ATA. Current ABI:            Unable to accurately calculate. Comparison Study: none Performing Technologist: Helon Lobos RVT  Examination Guidelines: A complete evaluation includes B-mode imaging, spectral Doppler, color Doppler, and power Doppler as needed of all accessible portions of each vessel. Bilateral testing is considered an integral part of a complete examination. Limited examinations for reoccurring indications may be performed as noted.  +-----------+--------+-----+--------+----------+--------+ RIGHT      PSV cm/sRatioStenosisWaveform  Comments +-----------+--------+-----+--------+----------+--------+ CFA Distal 174                  biphasic           +-----------+--------+-----+--------+----------+--------+ DFA        119                  biphasic           +-----------+--------+-----+--------+----------+--------+ SFA Prox   201                  monophasic          +-----------+--------+-----+--------+----------+--------+ SFA Mid                                   stent    +-----------+--------+-----+--------+----------+--------+ SFA Distal 116                  monophasic         +-----------+--------+-----+--------+----------+--------+ POP Prox   107                  monophasic         +-----------+--------+-----+--------+----------+--------+ POP Distal 97  monophasic         +-----------+--------+-----+--------+----------+--------+ ATA Prox   138                  monophasic         +-----------+--------+-----+--------+----------+--------+ ATA Mid    117                  monophasic         +-----------+--------+-----+--------+----------+--------+ ATA Distal 187                  monophasic         +-----------+--------+-----+--------+----------+--------+ PTA Distal                                bandage  +-----------+--------+-----+--------+----------+--------+ PERO Distal                               bandage  +-----------+--------+-----+--------+----------+--------+  Right Stent(s): +-----------------+--------+--------+----------+--------+ mid to distal SFAPSV cm/sStenosisWaveform  Comments +-----------------+--------+--------+----------+--------+ Prox to Stent    82              monophasic         +-----------------+--------+--------+----------+--------+ Proximal Stent   81              monophasic         +-----------------+--------+--------+----------+--------+ Mid Stent        83              monophasic         +-----------------+--------+--------+----------+--------+ Distal Stent     94              monophasic         +-----------------+--------+--------+----------+--------+ Distal to Stent  109             monophasic         +-----------------+--------+--------+----------+--------+  Summary: Right: Patent stent with no stenosis.  See table(s) above  for measurements and observations. Electronically signed by Delaney Fearing on 10/09/2023 at 9:27:20 AM.    Final    VAS US  ABI WITH/WO TBI Result Date: 10/09/2023  LOWER EXTREMITY DOPPLER STUDY Patient Name:  FELICIA SCHEIDECKER  Date of Exam:   10/08/2023 Medical Rec #: [id number]         Accession #:    [id number] Date of Birth: 1950-12-04         Patient Gender: M Patient Age:   64 years Exam Location:  [address] Procedure:      VAS US  ABI WITH/WO TBI Referring Phys: JOSHUA ROBINS --------------------------------------------------------------------------------  Indications: Peripheral artery disease. High Risk Factors: Prior CVA.  Vascular Interventions: 02/13/24: Angioplasty and stent right SFA.                         Angioplasty right ATA. Limitations: Today's exam was limited due to bandages. Performing Technologist: Helon Lobos RVT  Examination Guidelines: A complete evaluation includes at minimum, Doppler waveform signals and systolic blood pressure reading at the level of bilateral brachial, anterior tibial, and posterior tibial arteries, when vessel segments are accessible. Bilateral testing is considered an integral part of a complete examination. Photoelectric Plethysmograph (PPG) waveforms and toe systolic pressure readings are included as required and additional duplex testing as needed. Limited examinations for reoccurring indications may be performed as noted.  ABI Findings: +---------+------------------+-----+-------------------+--------+ Right    Rt Pressure (mmHg)IndexWaveform  Comment  +---------+------------------+-----+-------------------+--------+ Brachial                                           stroke   +---------+------------------+-----+-------------------+--------+ ATA      255               1.00 monophasic                  +---------+------------------+-----+-------------------+--------+ PTA      152               0.60 dampened monophasic          +---------+------------------+-----+-------------------+--------+ Great Toe110               0.43                             +---------+------------------+-----+-------------------+--------+ +---------+------------------+-----+----------+-------+ Left     Lt Pressure (mmHg)IndexWaveform  Comment +---------+------------------+-----+----------+-------+ Brachial 255                                      +---------+------------------+-----+----------+-------+ ATA      212               0.83 monophasic        +---------+------------------+-----+----------+-------+ PTA      255               1.00 monophasic        +---------+------------------+-----+----------+-------+ Great Toe65                0.25                   +---------+------------------+-----+----------+-------+ +-------+-----------+-----------+------------+------------+ ABI/TBIToday's ABIToday's TBIPrevious ABIPrevious TBI +-------+-----------+-----------+------------+------------+ Right                        [place]          0.15         +-------+-----------+-----------+------------+------------+ Left                         [place]          0.23         +-------+-----------+-----------+------------+------------+  Unable to acurately calculate ABI or TBI due to brachial and pedal pressures >255 mmHg. Pre op ABI on 08/13/23.  Summary: Right: Unable to acurately calculate ABI or TBI due to brachial and pedal pressures >255 mmHg. Left: Unable to acurately calculate ABI or TBI due to brachial and pedal pressures >255 mmHg. *See table(s) above for measurements and observations.  Electronically signed by Delaney Fearing on 10/09/2023 at 9:25:53 AM.    Final      EKG: My personal interpretation of EKG shows: Showing sinus bradycardia heart rate 50    Assessment/Plan: Principal Problem:   Acute metabolic encephalopathy Active Problems:   Acute encephalopathy   Hypomagnesemia   Elevated lactic acid level   Insulin   dependent type 2 diabetes mellitus (HCC)   PAD (peripheral artery disease) s/p DES stent and angioplasty [facility])   Essential hypertension   History of CVA (cerebrovascular accident)   Chronic diastolic CHF (congestive heart failure) (HCC)   Hypotension   Sinus bradycardia   History of prostate cancer    Assessment and Plan:  Acute metabolic encephalopathy/altered mental status -Patient has been transferred from [facility] to ED for nuclear medicine PET scan and altered mental status that started around 3 PM today?  History is not very clear from the nursing home. - At presentation to ED patient was given Narcan afterward patient is more alert and oriented. - Initially patient was hypotensive blood pressure has been improved after giving 2 L of LR bolus.  Heart rate in between 49-88 otherwise hemodynamically stable. -CT head no acute endocrine abnormality.  Evidence of previous CVA. - CBC no leukocytosis.  CMP markable.  Low mag 1.5 which has been repleted.  Initial lactic acid was 3 which has been improved to 0.5 after giving 2 L of bolus. - Chest x-ray no active disease process. -The ED patient has been given 2 L of LR bolus, vancomycin, cefepime and metronidazole . - Given there is no concern for infection holding further IV antibiotics. Need to follow-up with blood culture and UA result. -Acute metabolic encephalopathy in the setting of hypomagnesemia, bradycardia, hypotension and polypharmacy. - Checking ammonia level. - Checking MRI of the brain and admitting patient for to [facility] to get MRI tonight.  If MRI positive need to formally consult neurology for further recommendation. - Continue neurocheck every 4 hours. -Continue cardiac monitoring for bradycardia and arrhythmia.   History of CVA residual right-sided weakness, dysarthria dysphagia -Continue aspirin , Plavix  and Lipitor  Elevated lactic acid - Initial lactic acid of 3 which has been improved with 0.5 after  giving 2 L of bolus.  At this point there is no source of infection that is causing lactic acidosis.  Elevated lactic acid likely from dehydration and hypotension which has been improved.  Hypotension-resolved Sinus bradycardia-resolved History of essential hypertension -Holding blood pressure regimen in the setting of hypotension.  Blood pressure has been improved to 153/68 heart rate in between 49-58.  EKG showing sinus bradycardia heart rate 50. Continue cardiac monitoring.  Holding home blood pressure regimen.  Hypomagnesemia - Repleted with mag sulfate 2 g in the ED  History of prostate cancer? -Unable to find any record of prostate cancer on the chart however per chart review home medication list include Zytiga ?.  Waiting for pharmacy verification for home medication reconciliation. [facility] which has been done in the ED.  Unable to verify any information with patient's nursing home at this time.   History insulin -dependent  DM type II -Continue sliding scale insulin , Semglee  12 units.  History of PAD status post DES stent and angioplasty in March 2025 -Continue aspirin , Plavix  and Lipitor  Essential hypertension Chronic diastolic heart failure -At initial presentation to ED patient was hypotensive however blood pressure has been improved and currently trending up. -Holding Coreg  in the setting of bradycardia. - It is unclear if patient has been on amlodipine /olmesartan  at home.  Waiting for pharmacy verification of home medication.  Pending pharmacy verification/reconciliation of home medications.  DVT prophylaxis:  Lovenox  and SCD Code Status:  Full Code Diet: Heart healthy carb modified diet Disposition Plan: Pending MRI of the brain.  If it shows any evidence of new stroke need to inform neurology for formal consult. Consults: None at this time Admission status:   Inpatient, Telemetry bed-at [facility]  Severity of Illness: The appropriate patient status for this patient is INPATIENT. Inpatient status is judged to be reasonable and necessary in order to provide the required intensity of service  to ensure the patient's safety. The patient's presenting symptoms, physical exam findings, and initial radiographic and laboratory data in the context of their chronic comorbidities is felt to place them at high risk for further clinical deterioration. Furthermore, it is not anticipated that the patient will be medically stable for discharge from the hospital within 2 midnights of admission.   * I certify that at the point of admission it is my clinical judgment that the patient will require inpatient hospital care spanning beyond 2 midnights from the point of admission due to high intensity of service, high risk for further deterioration and high frequency of surveillance required.Aaron Aas    Natalyn Szymanowski, MD [facility]  How to contact the [facility] Attending or Consulting provider 7A - 7P or covering provider during after hours 7P -7A, for this patient.  Check the care team in [facility] and look for a) attending/consulting [facility] provider listed and b) the [facility] team listed Log into [email or web address] and use [facility] universal password to access. If you do not have the password, please contact the hospital operator. Locate the [facility] provider you are looking for under [facility] and page to a number that you can be directly reached. If you still have difficulty reaching the provider, please page the [facility] (Director on Call) for the Hospitalists listed on amion for assistance.  10/09/2023, 10:07 PM

## 2023-10-09 NOTE — ED Provider Notes (Addendum)
 [place] EMERGENCY DEPARTMENT AT [facility] Provider Note   CSN: [id number] Arrival date & time: 10/09/23  1538     History  Chief Complaint  Patient presents with   Hypotension   Possible Stroke   Bradycardia    [name] is a 73 y.o. male.  Patient brought over from [facility] medicine.  Did have scan completed.  Patient according to previous records from [facility].  Patient must have been transported in.  Apparently had altered mental status.  Sounds like they completed the scan.  Best we can tell.  Patient was brought here from nuclear medicine.  Patient will wake up here and will verbalize but is very drowsy.  Patient with a history of previous strokes.  Difficult to say exactly where the weakness is but there seems to be some contraction of the right upper extremity.  Patient's blood sugars good here was 128.  Patient received some IV fluids prior to me seeing him.  Blood pressure was in the 100s systolic heart rate was in the 40s and was sinus.  Oxygen saturations were good.       Home Medications Prior to Admission medications   Medication Sig Start Date End Date Taking? Authorizing Provider  abiraterone  acetate (ZYTIGA ) 250 MG tablet Take 1,000 mg by mouth daily. 09/12/22   [provider]  acetaminophen  (TYLENOL ) 500 MG tablet Take 1,000 mg by mouth in the morning, at noon, and at bedtime.    [provider]  amlodipine -olmesartan  (AZOR ) 10-20 MG tablet TAKE 1 TABLET BY MOUTH EVERY DAY 04/06/23   Hindel, Jim Motts, MD  aspirin  EC 81 MG tablet Take 1 tablet (81 mg total) by mouth daily. Swallow whole. 08/13/23   Kayla Part, MD  atorvastatin  (LIPITOR) 40 MG tablet Take 1 tablet (40 mg total) by mouth daily. 08/04/22   Ganta, Anupa, DO  carvedilol  (COREG ) 12.5 MG tablet Take 1 tablet (12.5 mg total) by mouth 2 (two) times daily with a meal. 03/18/23 10/02/23  Hindel, Jim Motts, MD  ciprofloxacin (CIPRO) 750 MG tablet Take 750 mg by mouth 2 (two)  times daily. 09/13/23 10/20/23  [provider]  clopidogrel  (PLAVIX ) 75 MG tablet Take 1 tablet (75 mg total) by mouth daily. 02/05/23   Hindel, Jim Motts, MD  ertapenem (INVANZ) 1 g injection Inject 1 g into the muscle daily. 09/13/23   [provider]  insulin  glargine (LANTUS  SOLOSTAR) 100 UNIT/ML Solostar Pen Inject 12 Units into the skin daily. Patient taking differently: Inject 18 Units into the skin daily. 02/19/23   Hindel, Jim Motts, MD  JARDIANCE  25 MG TABS tablet Take 25 mg by mouth daily. 09/28/23   [provider]  meloxicam (MOBIC) 7.5 MG tablet Take 7.5 mg by mouth daily. 07/24/23   [provider]  metFORMIN  (GLUCOPHAGE ) 1000 MG tablet Take 1 tablet (1,000 mg total) by mouth 2 (two) times daily with a meal. 02/17/23   Hindel, Leah, MD  pantoprazole  (PROTONIX ) 20 MG tablet TAKE 1 TABLET BY MOUTH EVERY DAY 04/27/23   Hindel, Leah, MD  predniSONE  (DELTASONE ) 5 MG tablet Take 5 mg by mouth daily. 09/12/22   [provider]  promethazine (PHENERGAN) 12.5 MG tablet Take 12.5 mg by mouth every 6 (six) hours as needed for vomiting, refractory nausea / vomiting or nausea. 09/20/23   [provider]  sertraline  (ZOLOFT ) 100 MG tablet Take 1 tablet (100 mg total) by mouth daily. 01/02/23   Naida Austria, MD  tamsulosin  (FLOMAX ) 0.4 MG  CAPS capsule Take 0.4 mg by mouth daily. 05/07/23   [provider]  tiZANidine  (ZANAFLEX ) 2 MG tablet Take 2 mg by mouth in the morning, at noon, and at bedtime. 08/11/23   [provider]      Allergies    Pork allergy and Shrimp flavor agent (non-screening)    Review of Systems   Review of Systems  Unable to perform ROS: Mental status change    Physical Exam Updated Vital Signs BP (!) 107/49 (BP Location: Right Arm)   Pulse (!) 49   Temp (!) 97.5 F (36.4 C) (Oral)   Resp 15   SpO2 100%  Physical Exam Vitals and nursing note reviewed.  Constitutional:      General: He is not in acute distress.     Appearance: He is well-developed.     Comments: Very drowsy but will wake up and verbalize.  Seems almost as if the speech is slurred.  HENT:     Head: Normocephalic and atraumatic.  Eyes:     Conjunctiva/sclera: Conjunctivae normal.     Pupils: Pupils are equal, round, and reactive to light.  Cardiovascular:     Rate and Rhythm: Regular rhythm. Bradycardia present.     Heart sounds: No murmur heard. Pulmonary:     Effort: Pulmonary effort is normal. No respiratory distress.     Breath sounds: Normal breath sounds. No wheezing, rhonchi or rales.  Abdominal:     Palpations: Abdomen is soft.     Tenderness: There is no abdominal tenderness.  Musculoskeletal:        General: No swelling.     Cervical back: Neck supple.  Skin:    General: Skin is warm and dry.     Capillary Refill: Capillary refill takes less than 2 seconds.  Neurological:     Comments: Goal to assess the neuroexam.  Seems to be some contraction to the hand on the right side.  Psychiatric:        Mood and Affect: Mood normal.     ED Results / Procedures / Treatments   Labs (all labs ordered are listed, but only abnormal results are displayed) Labs Reviewed  CBC WITH DIFFERENTIAL/PLATELET - Abnormal; Notable for the following components:      Result Value   RBC 3.76 (*)    Hemoglobin 11.0 (*)    HCT 36.4 (*)    Neutro Abs 8.2 (*)    All other components within normal limits  COMPREHENSIVE METABOLIC PANEL WITH GFR - Abnormal; Notable for the following components:   Glucose, Bld 123 (*)    BUN 25 (*)    Calcium  8.8 (*)    Total Protein 6.1 (*)    Albumin 3.2 (*)    All other components within normal limits  MAGNESIUM - Abnormal; Notable for the following components:   Magnesium 1.5 (*)    All other components within normal limits  CBG MONITORING, ED - Abnormal; Notable for the following components:   Glucose-Capillary 128 (*)    All other components within normal limits  I-STAT CG4 LACTIC ACID, ED -  Abnormal; Notable for the following components:   Lactic Acid, Venous 3.0 (*)    All other components within normal limits  CULTURE, BLOOD (ROUTINE X 2)  CULTURE, BLOOD (ROUTINE X 2)  URINALYSIS, ROUTINE W REFLEX MICROSCOPIC  AMMONIA  I-STAT CG4 LACTIC ACID, ED  TROPONIN I (HIGH SENSITIVITY)  TROPONIN I (HIGH SENSITIVITY)    EKG EKG Interpretation Date/Time:  Friday  Oct 09 2023 15:53:09 EDT Ventricular Rate:  50 PR Interval:  171 QRS Duration:  93 QT Interval:  488 QTC Calculation: 445 R Axis:   35  Text Interpretation: Sinus bradycardia Abnormal R-wave progression, early transition No significant change since last tracing Confirmed by Durk Carmen [phone]) on 10/09/2023 4:57:49 PM  Radiology DG Chest Port 1 View Result Date: 10/09/2023 CLINICAL DATA:  Weakness.  Lethargy. EXAM: PORTABLE CHEST 1 VIEW COMPARISON:  Oct 02, 2023. FINDINGS: The heart size and mediastinal contours are within normal limits. Both lungs are clear. The visualized skeletal structures are unremarkable. IMPRESSION: No active disease. Electronically Signed   By: Rosalene Colon M.D.   On: 10/09/2023 17:53   VAS US  LOWER EXTREMITY ARTERIAL DUPLEX Result Date: 10/09/2023 LOWER EXTREMITY ARTERIAL DUPLEX STUDY Patient Name:  [name]  Date of Exam:   10/08/2023 Medical Rec #: [id number]         Accession #:    [id number] Date of Birth: 1951/03/10         Patient Gender: M Patient Age:   4 years Exam Location:  [address] Procedure:      VAS US  LOWER EXTREMITY ARTERIAL DUPLEX Referring Phys: JOSHUA ROBINS --------------------------------------------------------------------------------  High Risk Factors: Prior CVA.  Vascular Interventions: 02/13/24: Angioplasty and stent right SFA.                         Angioplasty right ATA. Current ABI:            Unable to accurately calculate. Comparison Study: none Performing Technologist: Helon Lobos RVT  Examination Guidelines: A complete evaluation includes B-mode  imaging, spectral Doppler, color Doppler, and power Doppler as needed of all accessible portions of each vessel. Bilateral testing is considered an integral part of a complete examination. Limited examinations for reoccurring indications may be performed as noted.  +-----------+--------+-----+--------+----------+--------+ RIGHT      PSV cm/sRatioStenosisWaveform  Comments +-----------+--------+-----+--------+----------+--------+ CFA Distal 174                  biphasic           +-----------+--------+-----+--------+----------+--------+ DFA        119                  biphasic           +-----------+--------+-----+--------+----------+--------+ SFA Prox   201                  monophasic         +-----------+--------+-----+--------+----------+--------+ SFA Mid                                   stent    +-----------+--------+-----+--------+----------+--------+ SFA Distal 116                  monophasic         +-----------+--------+-----+--------+----------+--------+ POP Prox   107                  monophasic         +-----------+--------+-----+--------+----------+--------+ POP Distal 97                   monophasic         +-----------+--------+-----+--------+----------+--------+ ATA Prox   138                  monophasic         +-----------+--------+-----+--------+----------+--------+  ATA Mid    117                  monophasic         +-----------+--------+-----+--------+----------+--------+ ATA Distal 187                  monophasic         +-----------+--------+-----+--------+----------+--------+ PTA Distal                                bandage  +-----------+--------+-----+--------+----------+--------+ PERO Distal                               bandage  +-----------+--------+-----+--------+----------+--------+  Right Stent(s): +-----------------+--------+--------+----------+--------+ mid to distal SFAPSV  cm/sStenosisWaveform  Comments +-----------------+--------+--------+----------+--------+ Prox to Stent    82              monophasic         +-----------------+--------+--------+----------+--------+ Proximal Stent   81              monophasic         +-----------------+--------+--------+----------+--------+ Mid Stent        83              monophasic         +-----------------+--------+--------+----------+--------+ Distal Stent     94              monophasic         +-----------------+--------+--------+----------+--------+ Distal to Stent  109             monophasic         +-----------------+--------+--------+----------+--------+  Summary: Right: Patent stent with no stenosis.  See table(s) above for measurements and observations. Electronically signed by Delaney Fearing on 10/09/2023 at 9:27:20 AM.    Final    VAS US  ABI WITH/WO TBI Result Date: 10/09/2023  LOWER EXTREMITY DOPPLER STUDY Patient Name:  [name]  Date of Exam:   10/08/2023 Medical Rec #: [id number]         Accession #:    [id number] Date of Birth: 1951/02/26         Patient Gender: M Patient Age:   44 years Exam Location:  [address] Procedure:      VAS US  ABI WITH/WO TBI Referring Phys: JOSHUA ROBINS --------------------------------------------------------------------------------  Indications: Peripheral artery disease. High Risk Factors: Prior CVA.  Vascular Interventions: 02/13/24: Angioplasty and stent right SFA.                         Angioplasty right ATA. Limitations: Today's exam was limited due to bandages. Performing Technologist: Helon Lobos RVT  Examination Guidelines: A complete evaluation includes at minimum, Doppler waveform signals and systolic blood pressure reading at the level of bilateral brachial, anterior tibial, and posterior tibial arteries, when vessel segments are accessible. Bilateral testing is considered an integral part of a complete examination. Photoelectric Plethysmograph  (PPG) waveforms and toe systolic pressure readings are included as required and additional duplex testing as needed. Limited examinations for reoccurring indications may be performed as noted.  ABI Findings: +---------+------------------+-----+-------------------+--------+ Right    Rt Pressure (mmHg)IndexWaveform           Comment  +---------+------------------+-----+-------------------+--------+ Brachial  stroke   +---------+------------------+-----+-------------------+--------+ ATA      255               1.00 monophasic                  +---------+------------------+-----+-------------------+--------+ PTA      152               0.60 dampened monophasic         +---------+------------------+-----+-------------------+--------+ Great Toe110               0.43                             +---------+------------------+-----+-------------------+--------+ +---------+------------------+-----+----------+-------+ Left     Lt Pressure (mmHg)IndexWaveform  Comment +---------+------------------+-----+----------+-------+ Brachial 255                                      +---------+------------------+-----+----------+-------+ ATA      212               0.83 monophasic        +---------+------------------+-----+----------+-------+ PTA      255               1.00 monophasic        +---------+------------------+-----+----------+-------+ Great Toe65                0.25                   +---------+------------------+-----+----------+-------+ +-------+-----------+-----------+------------+------------+ ABI/TBIToday's ABIToday's TBIPrevious ABIPrevious TBI +-------+-----------+-----------+------------+------------+ Right                        [place]          0.15         +-------+-----------+-----------+------------+------------+ Left                         [place]          0.23          +-------+-----------+-----------+------------+------------+  Unable to acurately calculate ABI or TBI due to brachial and pedal pressures >255 mmHg. Pre op ABI on 08/13/23.  Summary: Right: Unable to acurately calculate ABI or TBI due to brachial and pedal pressures >255 mmHg. Left: Unable to acurately calculate ABI or TBI due to brachial and pedal pressures >255 mmHg. *See table(s) above for measurements and observations.  Electronically signed by Delaney Fearing on 10/09/2023 at 9:25:53 AM.    Final     Procedures Procedures    Medications Ordered in ED Medications  naloxone [facility]) injection 0.4 mg (0.4 mg Intravenous Incomplete 10/09/23 1652)  lactated ringers  infusion ( Intravenous New Bag/Given 10/09/23 1817)  ceFEPIme (MAXIPIME) 2 g in sodium chloride  0.9 % 100 mL IVPB (2 g Intravenous New Bag/Given 10/09/23 1803)  metroNIDAZOLE  (FLAGYL ) IVPB 500 mg (500 mg Intravenous New Bag/Given 10/09/23 1806)  vancomycin (VANCOCIN) IVPB 1000 mg/200 mL premix (1,000 mg Intravenous New Bag/Given 10/09/23 1805)  lactated ringers  bolus 1,000 mL (1,000 mLs Intravenous New Bag/Given 10/09/23 1801)    And  lactated ringers  bolus 1,000 mL (1,000 mLs Intravenous New Bag/Given 10/09/23 1802)    And  lactated ringers  bolus 1,000 mL (1,000 mLs Intravenous Not Given 10/09/23 1809)  magnesium sulfate IVPB 2 g 50 mL (has no administration in time range)  sodium chloride  0.9 % bolus  1,000 mL (1,000 mLs Intravenous New Bag/Given 10/09/23 1654)    ED Course/ Medical Decision Making/ A&P                                 Medical Decision Making Amount and/or Complexity of Data Reviewed Labs: ordered. Radiology: ordered.  Risk Prescription drug management.   CRITICAL CARE Performed by: Salome Hautala Total critical care time: 45 minutes Critical care time was exclusive of separately billable procedures and treating other patients. Critical care was necessary to treat or prevent imminent or life-threatening  deterioration. Critical care was time spent personally by me on the following activities: development of treatment plan with patient and/or surrogate as well as nursing, discussions with consultants, evaluation of patient's response to treatment, examination of patient, obtaining history from patient or surrogate, ordering and performing treatments and interventions, ordering and review of laboratory studies, ordering and review of radiographic studies, pulse oximetry and re-evaluation of patient's condition.  Patient now more alert.  With patient's lactic acid being 3 and the altered mental status.  We did start sepsis protocol.  Will be 3 L of fluids initially.  CBC white count was not significantly elevated 10.5 hemoglobin down a little bit 11 platelets 186.  Complete metabolic panel renal function was normal.  CO2 was 24 anion gap was normal.  Particular potassium was normal at 4.3 magnesium a little low at 1.5.  And portable chest x-ray without active disease.  Head CT still pending.  Patient based on labs may not have met sepsis criteria.  Will reassess to see if we get a better neuroexam.   Patient's head CT without any acute findings.  Redemonstrate the remote infarct in the left ventral pons.  Patient had a low magnesium had the altered mental status.  And patient's lactic acid which was initially high has now come down to 0.5.  Do not really have any acute explanation for that.  Patient has not significantly improved.  But will reassess right now.  Most likely will require admission.    Final Clinical Impression(s) / ED Diagnoses Final diagnoses:  Altered mental status, unspecified altered mental status type  Hypomagnesemia    Rx / DC Orders ED Discharge Orders     None         Nicklas Barns, MD 10/09/23 [id number]    Nicklas Barns, MD 10/09/23 [id number]    Nicklas Barns, MD 10/09/23 2009    Nicklas Barns, MD 10/09/23 2009    Nicklas Barns, MD 10/09/23  2012

## 2023-10-09 NOTE — ED Triage Notes (Signed)
 Pt BIB [facility] outpatient due to stroke like symptoms; pt more delayed in responses, pt lethargic, and hypotensive at 94/45. Staff reports pt was AAOx4 on arrival to ED. Pt last normal was around 3pm. Pt has a Hx of strokes, aphasia. Baseline left sided weakness.

## 2023-10-09 NOTE — ED Notes (Signed)
 This nurse called [facility] to transport patient.

## 2023-10-09 NOTE — ED Notes (Signed)
Pt. CBG 81, RN made aware.

## 2023-10-09 NOTE — Sepsis Progress Note (Signed)
 Code Sepsis protocol being monitored by [facility].

## 2023-10-10 ENCOUNTER — Inpatient Hospital Stay (HOSPITAL_COMMUNITY)

## 2023-10-10 DIAGNOSIS — I69351 Hemiplegia and hemiparesis following cerebral infarction affecting right dominant side: Secondary | ICD-10-CM

## 2023-10-10 DIAGNOSIS — Z794 Long term (current) use of insulin: Secondary | ICD-10-CM

## 2023-10-10 DIAGNOSIS — N390 Urinary tract infection, site not specified: Secondary | ICD-10-CM | POA: Diagnosis not present

## 2023-10-10 DIAGNOSIS — Z7984 Long term (current) use of oral hypoglycemic drugs: Secondary | ICD-10-CM | POA: Diagnosis not present

## 2023-10-10 DIAGNOSIS — E876 Hypokalemia: Secondary | ICD-10-CM | POA: Diagnosis not present

## 2023-10-10 DIAGNOSIS — C61 Malignant neoplasm of prostate: Secondary | ICD-10-CM | POA: Diagnosis not present

## 2023-10-10 DIAGNOSIS — I959 Hypotension, unspecified: Secondary | ICD-10-CM | POA: Diagnosis not present

## 2023-10-10 DIAGNOSIS — I63213 Cerebral infarction due to unspecified occlusion or stenosis of bilateral vertebral arteries: Secondary | ICD-10-CM | POA: Diagnosis not present

## 2023-10-10 DIAGNOSIS — E1151 Type 2 diabetes mellitus with diabetic peripheral angiopathy without gangrene: Secondary | ICD-10-CM

## 2023-10-10 DIAGNOSIS — I63233 Cerebral infarction due to unspecified occlusion or stenosis of bilateral carotid arteries: Secondary | ICD-10-CM | POA: Diagnosis not present

## 2023-10-10 DIAGNOSIS — I5032 Chronic diastolic (congestive) heart failure: Secondary | ICD-10-CM | POA: Diagnosis not present

## 2023-10-10 DIAGNOSIS — Z86718 Personal history of other venous thrombosis and embolism: Secondary | ICD-10-CM | POA: Diagnosis not present

## 2023-10-10 DIAGNOSIS — R4182 Altered mental status, unspecified: Secondary | ICD-10-CM | POA: Diagnosis not present

## 2023-10-10 DIAGNOSIS — Z7982 Long term (current) use of aspirin: Secondary | ICD-10-CM | POA: Diagnosis not present

## 2023-10-10 DIAGNOSIS — Z8673 Personal history of transient ischemic attack (TIA), and cerebral infarction without residual deficits: Secondary | ICD-10-CM | POA: Diagnosis not present

## 2023-10-10 DIAGNOSIS — R7401 Elevation of levels of liver transaminase levels: Secondary | ICD-10-CM | POA: Diagnosis not present

## 2023-10-10 DIAGNOSIS — R9089 Other abnormal findings on diagnostic imaging of central nervous system: Secondary | ICD-10-CM | POA: Diagnosis not present

## 2023-10-10 DIAGNOSIS — R262 Difficulty in walking, not elsewhere classified: Secondary | ICD-10-CM | POA: Diagnosis not present

## 2023-10-10 DIAGNOSIS — Z7902 Long term (current) use of antithrombotics/antiplatelets: Secondary | ICD-10-CM | POA: Diagnosis not present

## 2023-10-10 DIAGNOSIS — G9341 Metabolic encephalopathy: Secondary | ICD-10-CM | POA: Diagnosis not present

## 2023-10-10 DIAGNOSIS — I6782 Cerebral ischemia: Secondary | ICD-10-CM | POA: Diagnosis not present

## 2023-10-10 DIAGNOSIS — Z8546 Personal history of malignant neoplasm of prostate: Secondary | ICD-10-CM | POA: Diagnosis not present

## 2023-10-10 DIAGNOSIS — Z955 Presence of coronary angioplasty implant and graft: Secondary | ICD-10-CM | POA: Diagnosis not present

## 2023-10-10 DIAGNOSIS — R41841 Cognitive communication deficit: Secondary | ICD-10-CM | POA: Diagnosis not present

## 2023-10-10 DIAGNOSIS — Z993 Dependence on wheelchair: Secondary | ICD-10-CM

## 2023-10-10 DIAGNOSIS — I708 Atherosclerosis of other arteries: Secondary | ICD-10-CM | POA: Diagnosis not present

## 2023-10-10 DIAGNOSIS — E119 Type 2 diabetes mellitus without complications: Secondary | ICD-10-CM | POA: Diagnosis not present

## 2023-10-10 DIAGNOSIS — I739 Peripheral vascular disease, unspecified: Secondary | ICD-10-CM | POA: Diagnosis not present

## 2023-10-10 DIAGNOSIS — Z79899 Other long term (current) drug therapy: Secondary | ICD-10-CM | POA: Diagnosis not present

## 2023-10-10 DIAGNOSIS — Z7901 Long term (current) use of anticoagulants: Secondary | ICD-10-CM | POA: Diagnosis not present

## 2023-10-10 DIAGNOSIS — I639 Cerebral infarction, unspecified: Secondary | ICD-10-CM | POA: Diagnosis not present

## 2023-10-10 LAB — COMPREHENSIVE METABOLIC PANEL WITH GFR
ALT: 12 U/L (ref 0–44)
AST: 17 U/L (ref 15–41)
Albumin: 3.1 g/dL — ABNORMAL LOW (ref 3.5–5.0)
Alkaline Phosphatase: 62 U/L (ref 38–126)
Anion gap: 12 (ref 5–15)
BUN: 17 mg/dL (ref 8–23)
CO2: 24 mmol/L (ref 22–32)
Calcium: 9.2 mg/dL (ref 8.9–10.3)
Chloride: 106 mmol/L (ref 98–111)
Creatinine, Ser: 0.93 mg/dL (ref 0.61–1.24)
GFR, Estimated: >60 mL/min (ref >60)
Glucose, Bld: 104 mg/dL — ABNORMAL HIGH (ref 70–99)
Potassium: 5 mmol/L (ref 3.5–5.1)
Sodium: 142 mmol/L (ref 135–145)
Total Bilirubin: 0.7 mg/dL (ref 0.0–1.2)
Total Protein: 5.9 g/dL — ABNORMAL LOW (ref 6.5–8.1)

## 2023-10-10 LAB — URINALYSIS, ROUTINE W REFLEX MICROSCOPIC
Bilirubin Urine: NEGATIVE
Glucose, UA: >=500 mg/dL — AB
Hgb urine dipstick: NEGATIVE
Ketones, ur: NEGATIVE mg/dL
Nitrite: POSITIVE — AB
Protein, ur: NEGATIVE mg/dL
Specific Gravity, Urine: 1.009 (ref 1.005–1.030)
pH: 7 (ref 5.0–8.0)

## 2023-10-10 LAB — RAPID URINE DRUG SCREEN, HOSP PERFORMED
Amphetamines: NOT DETECTED
Barbiturates: NOT DETECTED
Benzodiazepines: NOT DETECTED
Cocaine: NOT DETECTED
Opiates: NOT DETECTED
Tetrahydrocannabinol: NOT DETECTED

## 2023-10-10 LAB — GLUCOSE, CAPILLARY
Glucose-Capillary: 99 mg/dL (ref 70–99)
Glucose-Capillary: 220 mg/dL — ABNORMAL HIGH (ref 70–99)
Glucose-Capillary: 186 mg/dL — ABNORMAL HIGH (ref 70–99)
Glucose-Capillary: 199 mg/dL — ABNORMAL HIGH (ref 70–99)

## 2023-10-10 LAB — CBC
HCT: 35.8 % — ABNORMAL LOW (ref 39.0–52.0)
Hemoglobin: 11.5 g/dL — ABNORMAL LOW (ref 13.0–17.0)
MCH: 29.6 pg (ref 26.0–34.0)
MCHC: 32.1 g/dL (ref 30.0–36.0)
MCV: 92 fL (ref 80.0–100.0)
Platelets: 175 10*3/uL (ref 150–400)
RBC: 3.89 MIL/uL — ABNORMAL LOW (ref 4.22–5.81)
RDW: 13.2 % (ref 11.5–15.5)
WBC: 13.9 10*3/uL — ABNORMAL HIGH (ref 4.0–10.5)
nRBC: 0 % (ref 0.0–0.2)

## 2023-10-10 LAB — HEMOGLOBIN A1C
Hgb A1c MFr Bld: 8 % — ABNORMAL HIGH (ref 4.8–5.6)
Mean Plasma Glucose: 182.9 mg/dL

## 2023-10-10 MED ORDER — CHLORHEXIDINE GLUCONATE CLOTH 2 % EX PADS
6.0000 | MEDICATED_PAD | Freq: Every day | CUTANEOUS | Status: DC
  Administered 2023-10-10 – 2023-10-12 (×3): 6 via TOPICAL

## 2023-10-10 MED ORDER — STROKE: EARLY STAGES OF RECOVERY BOOK
Freq: Once | Status: AC
  Filled 2023-10-10: qty 1

## 2023-10-10 MED ORDER — IOHEXOL 350 MG/ML SOLN
75.0000 mL | Freq: Once | INTRAVENOUS | Status: AC | PRN
  Administered 2023-10-10: 75 mL via INTRAVENOUS

## 2023-10-10 NOTE — Progress Notes (Signed)
 07:20H Patient back from MRI.

## 2023-10-10 NOTE — Consult Note (Signed)
 NEUROLOGY CONSULT NOTE   Date of service: Oct 10, 2023 Patient Name: [name] MRN:  [id number] DOB:  02/27/1951 Chief Complaint: "hypotension, stroke-like symptoms" Requesting Provider: Abbe Abate, MD  History of Present Illness  [name] is a 73 y.o. male with hx of CVA with residual right-sided weakness, dysarthria, HTN, DM2, PVD s/p SFA stenting and anterior tibial balloon angioplasty 08/19/23, chronic diastolic HFpEF, prostate cancer, wheelchair bound at baseline who presented to [facility] from [facility] due to stroke-like symptoms of right-sided weakness, aphasia, dysarthria, decreased LOC. Patient reported feeling drowsy throughout the afternoon, he was also hypotensive on admission.  MRI was completed, which showed an incidental acute/subacute stroke. Neurology consulted.  On exam today, patient states he feel like he is at his baseline of dysarthria, right facial droop and right-sided weakness. He states he was told he had several readings of low blood pressure before transferring over to [facility]. He was not feeling well yesterday and hasn't been for a couple of days.     ROS  Comprehensive ROS performed and pertinent positives documented in HPI   Past Medical History:  Diagnosis Date   CVA (cerebral infarction) 01/03/2008   MRI: Acute 1 x 1.5 cm infarction affecting the left side of the pons.   Diabetes mellitus    DVT (deep venous thrombosis) (HCC)    Hypertension    PAD (peripheral artery disease) (HCC)    Stroke (HCC)    2008    Past Surgical History:  Procedure Laterality Date   ABDOMINAL AORTOGRAM W/LOWER EXTREMITY Right 08/19/2023   Procedure: ABDOMINAL AORTOGRAM W/LOWER EXTREMITY;  Surgeon: Kayla Part, MD;  Location: [facility] INVASIVE CV LAB;  Service: Cardiovascular;  Laterality: Right;   COLONOSCOPY     LOWER EXTREMITY ANGIOGRAPHY Right 08/19/2023   Procedure: Lower Extremity Angiography;  Surgeon: Kayla Part, MD;  Location: [facility] INVASIVE CV  LAB;  Service: Cardiovascular;  Laterality: Right;   LOWER EXTREMITY INTERVENTION Right 08/19/2023   Procedure: LOWER EXTREMITY INTERVENTION;  Surgeon: Kayla Part, MD;  Location: [facility] INVASIVE CV LAB;  Service: Cardiovascular;  Laterality: Right;   None      Family History: Family History  Problem Relation Age of Onset   Diabetes Mother    Heart attack Mother    Hypertension Mother    Diabetes Sister    Hypertension Sister    Stroke Sister        2017   Hypertension Brother    Hypertension Daughter    Hypertension Son    Colon cancer Neg Hx    Esophageal cancer Neg Hx    Stomach cancer Neg Hx    Rectal cancer Neg Hx     Social History  reports that he has never smoked. He has never used smokeless tobacco. He reports that he does not drink alcohol and does not use drugs.  Allergies  Allergen Reactions   Pork Allergy Other (See Comments)    Raises Blood pressure per report   Shrimp Flavor Agent (Non-Screening) Other (See Comments)    Doesn't like it    Medications   Current Facility-Administered Medications:    [START ON 10/11/2023]  stroke: early stages of recovery book, , Does not apply, Once, Abbe Abate, MD   acetaminophen  (TYLENOL ) tablet 650 mg, 650 mg, Oral, Q6H PRN **OR** acetaminophen  (TYLENOL ) suppository 650 mg, 650 mg, Rectal, Q6H PRN, Sundil, Subrina, MD   aspirin  EC tablet 81 mg, 81 mg, Oral, Daily, Sundil, Subrina, MD, 81  mg at 10/10/23 0859   atorvastatin  (LIPITOR) tablet 40 mg, 40 mg, Oral, Daily, Sundil, Subrina, MD, 40 mg at 10/10/23 8295   Chlorhexidine  Gluconate Cloth 2 % PADS 6 each, 6 each, Topical, Q0600, Abbe Abate, MD, 6 each at 10/10/23 0300   clopidogrel  (PLAVIX ) tablet 75 mg, 75 mg, Oral, Daily, Sundil, Subrina, MD, 75 mg at 10/10/23 6213   insulin  aspart (novoLOG ) injection 0-5 Units, 0-5 Units, Subcutaneous, QHS, Sundil, Subrina, MD   insulin  aspart (novoLOG ) injection 0-6 Units, 0-6 Units, Subcutaneous, TID WC, Sundil,  Subrina, MD   insulin  glargine-yfgn (SEMGLEE ) injection 12 Units, 12 Units, Subcutaneous, Daily, Sundil, Subrina, MD, 12 Units at 10/10/23 0858   [COMPLETED] lactated ringers  bolus 1,000 mL, 1,000 mL, Intravenous, Once, Stopped at 11-02-23 2102 **AND** [COMPLETED] lactated ringers  bolus 1,000 mL, 1,000 mL, Intravenous, Once, Stopped at 2023/11/02 2101 **AND** lactated ringers  bolus 1,000 mL, 1,000 mL, Intravenous, Once, Sundil, Subrina, MD   lactated ringers  infusion, , Intravenous, Continuous, Sundil, Subrina, MD, Last Rate: 75 mL/hr at 10/10/23 1055, Infusion Verify at 10/10/23 1055   naloxone (NARCAN) injection 0.4 mg, 0.4 mg, Intravenous, Once, Sundil, Subrina, MD   ondansetron  (ZOFRAN ) tablet 4 mg, 4 mg, Oral, Q6H PRN **OR** ondansetron  (ZOFRAN ) injection 4 mg, 4 mg, Intravenous, Q6H PRN, Sundil, Subrina, MD   pantoprazole  (PROTONIX ) EC tablet 20 mg, 20 mg, Oral, Daily, Sundil, Subrina, MD, 20 mg at 10/10/23 0859   tamsulosin  (FLOMAX ) capsule 0.4 mg, 0.4 mg, Oral, Daily, Sundil, Subrina, MD, 0.4 mg at 10/10/23 0859  Vitals   Vitals:   10/10/23 0130 10/10/23 0145 10/10/23 0239 10/10/23 0729  BP: (!) 176/62 (!) 188/62 (!) 192/75 (!) 184/66  Pulse: 64 63 68 67  Resp: 17 14  18   Temp:   99.2 F (37.3 C) 98.3 F (36.8 C)  TempSrc:   Oral Oral  SpO2: 100% 98% 99% 100%    There is no height or weight on file to calculate BMI.  Physical Exam   Constitutional: Appears well-developed and well-nourished.  Cardiovascular: Normal rate and regular rhythm.  Respiratory: Effort normal, non-labored breathing. Room Air  Neurologic Examination   Neuro: Mental Status: Patient is awake, alert, oriented to person, place, month, year, and situation. Patient is able to give a clear and coherent history. No signs of aphasia or neglect. Moderate dysarthria is present.  Cranial Nerves: II: Visual Fields are full. Pupils are equal, round, and reactive to light.   III,IV, VI: EOMI without ptosis or  diploplia.  V: Facial sensation is symmetric to temperature VII: Right facial droop.  VIII: hearing is intact to voice X: Uvula elevates symmetrically XI: Shoulder shrug is symmetric. XII: tongue is midline without atrophy or fasciculations.  Motor: Increased tone to RUE. Bulk is normal.  Dense right hemiplegia (baseline from previous stroke) Sensory: Sensation is symmetric to light touch in the arms and legs. Cerebellar: FNF and HKS are reduced to right.    Labs/Imaging/Neurodiagnostic studies   CBC:  Recent Labs  Lab 2023/11/02 1615 10/10/23 0751  WBC 10.5 13.9*  NEUTROABS 8.2*  --   HGB 11.0* 11.5*  HCT 36.4* 35.8*  MCV 96.8 92.0  PLT 186 175   Basic Metabolic Panel:  Lab Results  Component Value Date   NA 142 10/10/2023   K 5.0 10/10/2023   CO2 24 10/10/2023   GLUCOSE 104 (H) 10/10/2023   BUN 17 10/10/2023   CREATININE 0.93 10/10/2023   CALCIUM  9.2 10/10/2023   GFRNONAA >60 10/10/2023  GFRAA 81 06/18/2018   Lipid Panel:  Lab Results  Component Value Date   LDLCALC 70 03/13/2023   HgbA1c:  Lab Results  Component Value Date   HGBA1C 8.0 (H) 10/09/2023   Urine Drug Screen:     Component Value Date/Time   LABOPIA NONE DETECTED 07/24/2022 1836   COCAINSCRNUR NONE DETECTED 07/24/2022 1836   LABBENZ NONE DETECTED 07/24/2022 1836   AMPHETMU NONE DETECTED 07/24/2022 1836   THCU NONE DETECTED 07/24/2022 1836   LABBARB NONE DETECTED 07/24/2022 1836    Alcohol Level     Component Value Date/Time   ETH <10 07/24/2022 1630   INR  Lab Results  Component Value Date   INR 1.2 11/15/2021   APTT  Lab Results  Component Value Date   APTT 26 06/02/2017   AED levels: No results found for: "PHENYTOIN", "ZONISAMIDE", "LAMOTRIGINE", "LEVETIRACETA"  CT Head without contrast(Personally reviewed): No CT evidence of acute intracranial abnormality. Redemonstrated remote infarct in the left ventral pons. Chronic microvascular ischemic changes and mild  parenchymal volume loss.  CT angio Head and Neck with contrast(Personally reviewed): PENDING  MRI Brain(Personally reviewed): Small acute or subacute infarct in the left corona radiata. Background of extensive chronic small vessel disease.  ASSESSMENT   ZAKHARI MICHNIEWICZ is a 73 y.o. male with hx of CVA with residual right-sided weakness, dysarthria, HTN, DM2, wheelchair bound at baseline who presented to EF from Nuclear medicine due to stroke-like symptoms of right-sided weakness, aphasia, dysarthria, decreased LOC.   MRI was completed, which showed an incidental acute/subacute stroke. On exam today, patient states he feel like he is at his baseline of dysarthria, right facial droop and right-sided weakness.   Impression: Incidental Acute/subacute Left Infarct, etiology small vessel disease likely combined with recrudescence of chronic stroke symptoms due to UTI, hypotensive event.   Patient is already on Aspirin  and Plavix  at home, Lipitor 40mg  and multiple DM medications including insulin  and Metformin . A1c is elevated above goal at 8.0, recommend further PCP follow-up for better DM control. Pending Lipid panel.   RECOMMENDATIONS  - Frequent Neuro checks per stroke unit protocol - STAT CTH with any changes in neuro status - Continue UTI treatment, avoid cefepime - Vascular imaging - CT Angio - TTE - Lipid panel pending - Statin - will be increased if LDL>70 or otherwise medically indicated Patient is already on Lipitor 40mg  - Antithrombotic - Continue Aspirin  and Plavix  - DVT ppx - ok for Lovenox  - SBP goal - Permissive hypertension for 24-48 hours from symptoms onset, then gradually normalize. Avoid hypotension.  - Telemetry monitoring for arrhythmia - 72h - Swallow screen - will be performed prior to PO intake - Stroke education - will be given - PT/OT/SLP  - Dispo: continue stroke workup  Stroke team will follow beginning  5/11AM ______________________________________________________________________    Signed, Audrene Lease, NP [facility]   NEUROHOSPITALIST ADDENDUM Performed a face to face diagnostic evaluation.   I have reviewed the contents of history and physical exam as documented by PA/ARNP/Resident and agree with above documentation.  I have discussed and formulated the above plan as documented. [name].  Santosha Jividen, MD [facility] [phone]   If 7pm to 7am, please call on call as listed on AMION.

## 2023-10-10 NOTE — Plan of Care (Signed)
  Problem: Coping: Goal: Ability to adjust to condition or change in health will improve Outcome: Progressing   Problem: Education: Goal: Ability to describe self-care measures that may prevent or decrease complications (Diabetes Survival Skills Education) will improve Outcome: Progressing Goal: Individualized Educational Video(s) Outcome: Progressing   Problem: Nutritional: Goal: Maintenance of adequate nutrition will improve Outcome: Progressing Goal: Progress toward achieving an optimal weight will improve Outcome: Progressing   Problem: Skin Integrity: Goal: Risk for impaired skin integrity will decrease Outcome: Progressing   Problem: Clinical Measurements: Goal: Ability to maintain clinical measurements within normal limits will improve Outcome: Progressing Goal: Will remain free from infection Outcome: Progressing Goal: Diagnostic test results will improve Outcome: Progressing Goal: Respiratory complications will improve Outcome: Progressing Goal: Cardiovascular complication will be avoided Outcome: Progressing   Problem: Pain Managment: Goal: General experience of comfort will improve and/or be controlled Outcome: Progressing   Problem: Safety: Goal: Ability to remain free from injury will improve Outcome: Progressing   Problem: Skin Integrity: Goal: Risk for impaired skin integrity will decrease Outcome: Progressing

## 2023-10-10 NOTE — Progress Notes (Signed)
   10/10/23 1000  Pre-Screen- If "YES" to any of the following, STOP the screen, keep NPO, and place order for SLP eval and treat.   Home diet required thickened liquids No - Proceed  Trach tube present No - Proceed  Radiation to Head/Neck No - Proceed  Patient Readiness- If "YES" to any of the following, WAIT to screen, keep NPO, and place order for SLP eval and treat. May rescreen if clinical improvement WITHIN 24h.    Is patient lethargic or unable to stay alert/awake? No - Proceed  HOB restricted to <30 degrees No - Proceed  NPO for planned procedure No - Proceed  Aspiration Risk Assessment  Is patient oriented to name, place, or year? Yes - Proceed  Able to open mouth, stick out tongue, or smile Yes - Proceed  Able to seal lips Yes - Proceed  Able to move tongue from side to side Yes - Proceed  Face is symmetric No - Proceed  Mandatory oral care performed Yes  3 oz Water Challenge  Does the patient stop drinking? No - Proceed  Does the patient cough/choke? No - Proceed  Document PASS or FAIL PASS- Obtain Diet Order

## 2023-10-10 NOTE — Progress Notes (Signed)
 [name]  UJW:[id number] DOB: Jul 09, 1950 DOA: 10/09/2023 PCP: Naida Austria, MD    Brief Narrative:  73 year old with a history of prostate cancer, CVA 2008 with residual right-sided weakness dysphagia and aphasia, HTN, DM2, PAD status post SFA stenting & anterior tibial balloon angioplasty 08/19/2023, and diastolic CHF who was transferred from [facility] to the ER due to altered mental status described as generalized lethargy and drowsiness without loss of consciousness.  In the ER his CBG was normal.  Labs were unremarkable with exception showing mildly decreased magnesium at 1.5.  CT head revealed no acute abnormality.  CXR was unrevealing.  Goals of Care:   Code Status: Full Code   DVT prophylaxis: SCDs Start: 10/09/23 2054 Place TED hose Start: 10/09/23 2054   Interim Hx: Afebrile.  Blood pressure elevated with systolic range 153-192.  Assessment & Plan:  Acute metabolic encephalopathy of unclear etiology History reports significant improvement in patient's mental status after he was dosed with Narcan -CT head unrevealing -no clinical evidence to suggest acute infection -MRI brain 5/10 notes small acute versus subacute infarct in the left corona radiata with background of extensive chronic small vessel disease  Acute versus subacute CVA left corona radiata -Resultant transient altered mental status -MRI brain noted small acute versus subacute infarct in the left corona radiata with background of extensive chronic small vessel disease  -CTa head and neck pending -TTE pending -medical treatment: already on aspirin  and Plavix  for PAD with DES stent -LDL pending -A1c 8.0 -PT/OT/SLP -Allow for permissive hypertension   CVA L ventral pons 2008 with chronic right-sided weakness dysarthria and dysphagia Continue aspirin  Plavix  and Lipitor  Lactic acidosis Likely due to simple volume depletion - resolved with volume expansion  Mild hypotension Likely simply due to volume  depletion - resolved with volume expansion  HTN Allowing for permissive hypertension in the setting of acute/subacute CVA  Hypomagnesemia Corrected   Hx of Prostate cancer Undergoing treatment at [facility] urology  DM2 CBG well-controlled as inpatient - A1c 8.0  PAD status post DES stent and angioplasty March 2025 Continue aspirin , Plavix , and Lipitor  Chronic diastolic CHF  Family Communication: I spoke with the patient's daughter via telephone at length Disposition: Anticipate probable need for SNF rehab stay   Objective: Blood pressure (!) 184/66, pulse 67, temperature 98.3 F (36.8 C), temperature source Oral, resp. rate 18, SpO2 100%.  Intake/Output Summary (Last 24 hours) at 10/10/2023 0934 Last data filed at 10/10/2023 0859 Gross per 24 hour  Intake 3320.33 ml  Output 3100 ml  Net 220.33 ml   There were no vitals filed for this visit.  Examination: General: No acute respiratory distress Lungs: Clear to auscultation bilaterally without wheezes or crackles Cardiovascular: Regular rate and rhythm without murmur gallop or rub normal S1 and S2 Abdomen: Nontender, nondistended, soft, bowel sounds positive, no rebound, no ascites, no appreciable mass Extremities: No significant cyanosis, clubbing, or edema bilateral lower extremities  CBC: Recent Labs  Lab 10/09/23 1615 10/10/23 0751  WBC 10.5 13.9*  NEUTROABS 8.2*  --   HGB 11.0* 11.5*  HCT 36.4* 35.8*  MCV 96.8 92.0  PLT 186 175   Basic Metabolic Panel: Recent Labs  Lab 10/09/23 1615 10/10/23 0751  NA 139 142  K 4.3 5.0  CL 106 106  CO2 24 24  GLUCOSE 123* 104*  BUN 25* 17  CREATININE 1.10 0.93  CALCIUM  8.8* 9.2  MG 1.5*  --    GFR: Estimated Creatinine Clearance: 77.5 mL/min (by  C-G formula based on SCr of 0.93 mg/dL).   Scheduled Meds:  aspirin  EC  81 mg Oral Daily   atorvastatin   40 mg Oral Daily   Chlorhexidine  Gluconate Cloth  6 each Topical Q0600   clopidogrel   75 mg Oral Daily    insulin  aspart  0-5 Units Subcutaneous QHS   insulin  aspart  0-6 Units Subcutaneous TID WC   insulin  glargine-yfgn  12 Units Subcutaneous Daily   naLOXone (NARCAN)  injection  0.4 mg Intravenous Once   pantoprazole   20 mg Oral Daily   sodium chloride  flush  3 mL Intravenous Q12H   tamsulosin   0.4 mg Oral Daily   Continuous Infusions:  sodium chloride      lactated ringers      lactated ringers  75 mL/hr at 10/10/23 0858     LOS: 1 day   Kyle Abate, MD [facility] Office  [phone] Pager - Text Page per Tilford Foley  If 7PM-7AM, please contact night-coverage per Amion 10/10/2023, 9:34 AM

## 2023-10-10 NOTE — Progress Notes (Signed)
 Admission Notes  02:35H  Received patient from [facility] on stretcher accompanied by [employer]. With IVF and foley cath. On room air. Alert and oriented x4. Safety precautions initiated: bed wheels lock, side rails up and call bell within reach. Hooked to telebox #[phone]

## 2023-10-10 NOTE — Progress Notes (Signed)
 06:26H Patient left for MRI via bed. Accompanied by transport service.

## 2023-10-10 NOTE — Plan of Care (Signed)

## 2023-10-11 ENCOUNTER — Observation Stay (HOSPITAL_BASED_OUTPATIENT_CLINIC_OR_DEPARTMENT_OTHER)

## 2023-10-11 ENCOUNTER — Other Ambulatory Visit (HOSPITAL_COMMUNITY)

## 2023-10-11 DIAGNOSIS — E1151 Type 2 diabetes mellitus with diabetic peripheral angiopathy without gangrene: Secondary | ICD-10-CM | POA: Diagnosis not present

## 2023-10-11 DIAGNOSIS — G9341 Metabolic encephalopathy: Secondary | ICD-10-CM | POA: Diagnosis not present

## 2023-10-11 DIAGNOSIS — E785 Hyperlipidemia, unspecified: Secondary | ICD-10-CM

## 2023-10-11 DIAGNOSIS — Z7982 Long term (current) use of aspirin: Secondary | ICD-10-CM | POA: Diagnosis not present

## 2023-10-11 DIAGNOSIS — Z794 Long term (current) use of insulin: Secondary | ICD-10-CM | POA: Diagnosis not present

## 2023-10-11 DIAGNOSIS — I6389 Other cerebral infarction: Secondary | ICD-10-CM

## 2023-10-11 DIAGNOSIS — N390 Urinary tract infection, site not specified: Secondary | ICD-10-CM | POA: Diagnosis not present

## 2023-10-11 DIAGNOSIS — I69351 Hemiplegia and hemiparesis following cerebral infarction affecting right dominant side: Secondary | ICD-10-CM | POA: Diagnosis not present

## 2023-10-11 DIAGNOSIS — I959 Hypotension, unspecified: Secondary | ICD-10-CM | POA: Diagnosis not present

## 2023-10-11 DIAGNOSIS — R29705 NIHSS score 5: Secondary | ICD-10-CM | POA: Diagnosis not present

## 2023-10-11 DIAGNOSIS — Z7984 Long term (current) use of oral hypoglycemic drugs: Secondary | ICD-10-CM | POA: Diagnosis not present

## 2023-10-11 DIAGNOSIS — Z7901 Long term (current) use of anticoagulants: Secondary | ICD-10-CM | POA: Diagnosis not present

## 2023-10-11 LAB — LIPID PANEL
Cholesterol: 90 mg/dL (ref 0–200)
HDL: 35 mg/dL — ABNORMAL LOW (ref >40)
LDL Cholesterol: 43 mg/dL (ref 0–99)
Total CHOL/HDL Ratio: 2.6 ratio
Triglycerides: 62 mg/dL (ref <150)
VLDL: 12 mg/dL (ref 0–40)

## 2023-10-11 LAB — COMPREHENSIVE METABOLIC PANEL WITH GFR
ALT: 12 U/L (ref 0–44)
AST: 14 U/L — ABNORMAL LOW (ref 15–41)
Albumin: 2.8 g/dL — ABNORMAL LOW (ref 3.5–5.0)
Alkaline Phosphatase: 60 U/L (ref 38–126)
Anion gap: 8 (ref 5–15)
BUN: 13 mg/dL (ref 8–23)
CO2: 27 mmol/L (ref 22–32)
Calcium: 8.5 mg/dL — ABNORMAL LOW (ref 8.9–10.3)
Chloride: 105 mmol/L (ref 98–111)
Creatinine, Ser: 0.82 mg/dL (ref 0.61–1.24)
GFR, Estimated: >60 mL/min (ref >60)
Glucose, Bld: 99 mg/dL (ref 70–99)
Potassium: 3.4 mmol/L — ABNORMAL LOW (ref 3.5–5.1)
Sodium: 140 mmol/L (ref 135–145)
Total Bilirubin: 0.7 mg/dL (ref 0.0–1.2)
Total Protein: 5.7 g/dL — ABNORMAL LOW (ref 6.5–8.1)

## 2023-10-11 LAB — GLUCOSE, CAPILLARY
Glucose-Capillary: 91 mg/dL (ref 70–99)
Glucose-Capillary: 137 mg/dL — ABNORMAL HIGH (ref 70–99)
Glucose-Capillary: 212 mg/dL — ABNORMAL HIGH (ref 70–99)
Glucose-Capillary: 235 mg/dL — ABNORMAL HIGH (ref 70–99)

## 2023-10-11 LAB — ECHOCARDIOGRAM COMPLETE
AR max vel: 2.52 cm2
AV Peak grad: 4.8 mmHg
Ao pk vel: 1.1 m/s
Area-P 1/2: 2.2 cm2
S' Lateral: 1.8 cm

## 2023-10-11 LAB — CBC
HCT: 34.7 % — ABNORMAL LOW (ref 39.0–52.0)
Hemoglobin: 11.3 g/dL — ABNORMAL LOW (ref 13.0–17.0)
MCH: 29.8 pg (ref 26.0–34.0)
MCHC: 32.6 g/dL (ref 30.0–36.0)
MCV: 91.6 fL (ref 80.0–100.0)
Platelets: 173 10*3/uL (ref 150–400)
RBC: 3.79 MIL/uL — ABNORMAL LOW (ref 4.22–5.81)
RDW: 13.1 % (ref 11.5–15.5)
WBC: 11.1 10*3/uL — ABNORMAL HIGH (ref 4.0–10.5)
nRBC: 0 % (ref 0.0–0.2)

## 2023-10-11 LAB — MAGNESIUM: Magnesium: 1.5 mg/dL — ABNORMAL LOW (ref 1.7–2.4)

## 2023-10-11 MED ORDER — MAGNESIUM SULFATE 2 GM/50ML IV SOLN
2.0000 g | Freq: Once | INTRAVENOUS | Status: AC
  Administered 2023-10-11: 2 g via INTRAVENOUS
  Filled 2023-10-11: qty 50

## 2023-10-11 MED ORDER — POTASSIUM CHLORIDE CRYS ER 20 MEQ PO TBCR
40.0000 meq | EXTENDED_RELEASE_TABLET | Freq: Two times a day (BID) | ORAL | Status: AC
  Administered 2023-10-11 – 2023-10-12 (×3): 40 meq via ORAL
  Filled 2023-10-11 (×3): qty 2

## 2023-10-11 MED ORDER — PERFLUTREN LIPID MICROSPHERE
1.0000 mL | INTRAVENOUS | Status: AC | PRN
  Administered 2023-10-11: 4 mL via INTRAVENOUS

## 2023-10-11 NOTE — Progress Notes (Signed)
 [name]  ZOX:[id number] DOB: 01/28/1951 DOA: 10/09/2023 PCP: Naida Austria, MD    Brief Narrative:  73 year old with a history of prostate cancer, CVA 2008 with residual right-sided weakness dysphagia and aphasia, HTN, DM2, PAD status post SFA stenting & anterior tibial balloon angioplasty 08/19/2023, and diastolic CHF who was transferred from [facility] to the ER due to altered mental status described as generalized lethargy and drowsiness without loss of consciousness.  In the ER his CBG was normal.  Labs were unremarkable with exception to mildly decreased magnesium at 1.5.  CT head revealed no acute abnormality.  CXR was unrevealing.  Goals of Care:   Code Status: Full Code   DVT prophylaxis: SCDs Start: 10/09/23 2054 Place TED hose Start: 10/09/23 2054   Interim Hx: No acute events reported overnight.  Afebrile.  Vital stable.  Denies any new complaints at time of my visit today.  Assessment & Plan:  Acute metabolic encephalopathy due to acute CVA History reports significant improvement in patient's mental status after he was dosed with Narcan -CT head unrevealing -no clinical evidence to suggest acute infection -MRI brain 5/10 notes small acute versus subacute infarct in the left corona radiata with background of extensive chronic small vessel disease  Acute versus subacute CVA left corona radiata -Resultant transient altered mental status/lethargy  -MRI brain noted small acute versus subacute infarct in the left corona radiata with background of extensive chronic small vessel disease  -CTa head and neck negative for LVO or other acute findings but did appreciate 60% atheromatous stenosis of the right carotid bulb with additional 75% stenosis at the distal cervical right ICA at the skull base and 45% atheromatous stenosis at the left carotid bulb with advanced atheromatous change about the carotid siphons bilaterally with moderate to severe stenosis as well as severe stenosis  at the origins of both vertebral arteries and 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 -TTE pending -medical treatment: already on aspirin  and Plavix  for PAD with DES stent -LDL 43 -A1c 8.0 -PT/OT/SLP suggest SNF rehab stay  -Allow for permissive hypertension   CVA L ventral pons 2008 with chronic right-sided weakness dysarthria and dysphagia Continue aspirin  Plavix  and Lipitor  Lactic acidosis Likely due to simple volume depletion - resolved with volume expansion  Mild hypotension Likely simply due to volume depletion - resolved with volume expansion  HTN Allowing for permissive hypertension in the setting of acute/subacute CVA  Hypomagnesemia Supplement further today and monitor trend  Hypokalemia Corrected magnesium then supplement potassium as well  Hx of Prostate cancer Undergoing treatment at [facility]  DM2 CBG well-controlled as inpatient - A1c 8.0  PAD status post DES stent and angioplasty March 2025 Continue aspirin , Plavix , and Lipitor  Chronic diastolic CHF Appears euvolemic/well compensated at present  Family Communication: I spoke with the patient's daughter via telephone at length 5/10 Disposition: Anticipate probable need for SNF rehab stay   Objective: Blood pressure (!) 172/62, pulse 71, temperature 99.3 F (37.4 C), temperature source Axillary, resp. rate 18, SpO2 100%.  Intake/Output Summary (Last 24 hours) at 10/11/2023 0902 Last data filed at 10/11/2023 0422 Gross per 24 hour  Intake 745.12 ml  Output 5000 ml  Net -4254.88 ml   There were no vitals filed for this visit.  Examination: General: No acute respiratory distress Lungs: Clear to auscultation bilaterally Cardiovascular: Regular rate and rhythm without murmur gallop or rub normal S1 and S2 Abdomen: Nontender, nondistended, soft, bowel sounds positive, no rebound Extremities: No  significant cyanosis, clubbing, or edema bilateral lower extremities  CBC: Recent  Labs  Lab 10/09/23 1615 10/10/23 0751 10/11/23 0718  WBC 10.5 13.9* 11.1*  NEUTROABS 8.2*  --   --   HGB 11.0* 11.5* 11.3*  HCT 36.4* 35.8* 34.7*  MCV 96.8 92.0 91.6  PLT 186 175 173   Basic Metabolic Panel: Recent Labs  Lab 10/09/23 1615 10/10/23 0751 10/11/23 0718  NA 139 142 140  K 4.3 5.0 3.4*  CL 106 106 105  CO2 24 24 27   GLUCOSE 123* 104* 99  BUN 25* 17 13  CREATININE 1.10 0.93 0.82  CALCIUM  8.8* 9.2 8.5*  MG 1.5*  --  1.5*   GFR: Estimated Creatinine Clearance: 87.9 mL/min (by C-G formula based on SCr of 0.82 mg/dL).   Scheduled Meds:   stroke: early stages of recovery book   Does not apply Once   aspirin  EC  81 mg Oral Daily   atorvastatin   40 mg Oral Daily   Chlorhexidine  Gluconate Cloth  6 each Topical Q0600   clopidogrel   75 mg Oral Daily   insulin  aspart  0-5 Units Subcutaneous QHS   insulin  aspart  0-6 Units Subcutaneous TID WC   insulin  glargine-yfgn  12 Units Subcutaneous Daily   pantoprazole   20 mg Oral Daily   tamsulosin   0.4 mg Oral Daily     LOS: 2 days   Abbe Abate, MD [facility] Office  [phone] Pager - Text Page per Amion  If 7PM-7AM, please contact night-coverage per Amion 10/11/2023, 9:02 AM

## 2023-10-11 NOTE — Evaluation (Signed)
 Occupational Therapy Evaluation Patient Details Name: [name] MRN: [id number] DOB: 23-Jan-1951 Today's Date: 10/11/2023   History of Present Illness   Pt os a 73 y.o. male presenting [facility] with stroke like symptoms. 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     Clinical Impressions PTA, pt reports he has recently been at SNF for rehab but they signed off recently and he has still been at skilled nursing. Pt reports prior to rehab has been living at home; pt states no plans to transition to LTC and was unsure why he was still at a SNF. Upon eval, pt with chronic RUE hemiplegia, reduced clarity of speech, decreased balance, safety, and memory. Pt needing min A for bed mobility and initial STS transfer but progressing to CGA with second attempt and able to mobilize with hemi walker in room to sink as well as perform grooming CGA. Patient will benefit from continued inpatient follow up therapy, <3 hours/day. If pt can receive increased assistance at home and resumption of HH services, pending progression, may be able to go home.      If plan is discharge home, recommend the following:   A little help with walking and/or transfers;A lot of help with bathing/dressing/bathroom;Assistance with cooking/housework;Assist for transportation;Help with stairs or ramp for entrance     Functional Status Assessment   Patient has had a recent decline in their functional status and demonstrates the ability to make significant improvements in function in a reasonable and predictable amount of time.     Equipment Recommendations   Other (comment) (defer)     Recommendations for Other Services   PT consult     Precautions/Restrictions   Precautions Precautions: Fall Restrictions Weight Bearing Restrictions Per Provider Order: No     Mobility Bed  Mobility Overal bed mobility: Needs Assistance Bed Mobility: Supine to Sit, Sit to Supine     Supine to sit: Min assist Sit to supine: Contact guard assist   General bed mobility comments: min A for truncal elevation    Transfers Overall transfer level: Needs assistance Equipment used: Hemi-walker Transfers: Sit to/from Stand Sit to Stand: Min assist, Contact guard assist           General transfer comment: min A progressing to CGA      Balance Overall balance assessment: Needs assistance Sitting-balance support: Single extremity supported, No upper extremity supported Sitting balance-Leahy Scale: Fair     Standing balance support: Single extremity supported, During functional activity, No upper extremity supported Standing balance-Leahy Scale: Fair Standing balance comment: statically                           ADL either performed or assessed with clinical judgement   ADL Overall ADL's : Needs assistance/impaired Eating/Feeding: Set up;Bed level   Grooming: Contact guard assist;Standing Grooming Details (indicate cue type and reason): for safety Upper Body Bathing: Minimal assistance;Sitting   Lower Body Bathing: Moderate assistance;Sit to/from stand   Upper Body Dressing : Minimal assistance;Sitting   Lower Body Dressing: Moderate assistance;Sitting/lateral leans   Toilet Transfer: Contact guard assist;Ambulation (hemi walker)           Functional mobility during ADLs: Contact guard assist (hemi walker)       Vision Patient Visual Report: No change from baseline Additional Comments: able to locate grooming items at sink  Perception         Praxis         Pertinent Vitals/Pain Pain Assessment Pain Assessment: No/denies pain     Extremity/Trunk Assessment Upper Extremity Assessment Upper Extremity Assessment: RUE deficits/detail RUE Deficits / Details: R hemi at baseline, pt reports his arm has been like this since CVA in  2008. Pt with elbow flexion contracture. RUE Coordination: decreased fine motor;decreased gross motor   Lower Extremity Assessment Lower Extremity Assessment: Defer to PT evaluation       Communication Communication Communication: Impaired Factors Affecting Communication: Difficulty expressing self   Cognition Arousal: Alert Behavior During Therapy: WFL for tasks assessed/performed Cognition: No family/caregiver present to determine baseline, Cognition impaired       Memory impairment (select all impairments): Short-term memory Attention impairment (select first level of impairment): Selective attention   OT - Cognition Comments: intermittent cues for safey, fair delayed memory recall when intrinsically motivated to recall information.                 Following commands: Intact       Cueing  General Comments   Cueing Techniques: Verbal cues      Exercises     Shoulder Instructions      Home Living Family/patient expects to be discharged to:: Private residence Living Arrangements: Alone Available Help at Discharge: Family;Friend(s);Available PRN/intermittently Type of Home: House Home Access: Ramped entrance     Home Layout: One level     Bathroom Shower/Tub: Walk-in shower;Sponge bathes at baseline   Bathroom Toilet: Handicapped height     Home Equipment: Cane - quad;BSC/3in1;Tub bench;Rollator (4 wheels);Rolling Walker (2 wheels);Other (comment)   Additional Comments: per chart from 2024, PCA comes 2 hours/day x5 days/week, but pt report recently at SNF for rehab and when asked PLOF, pt did not mention this  Lives With: Alone    Prior Functioning/Environment Prior Level of Function : Needs assist             Mobility Comments: uses hemi walker with mod I ADLs Comments: Reports he cooks and cleans, sponge bathes, and dresses himself. He does not drive    OT Problem List: Decreased strength;Impaired balance (sitting and/or  standing);Decreased activity tolerance;Decreased cognition;Decreased coordination;Decreased knowledge of use of DME or AE;Impaired UE functional use   OT Treatment/Interventions: Self-care/ADL training;Therapeutic exercise;DME and/or AE instruction;Therapeutic activities;Patient/family education;Balance training;Cognitive remediation/compensation      OT Goals(Current goals can be found in the care plan section)   Acute Rehab OT Goals Patient Stated Goal: go home OT Goal Formulation: With patient Time For Goal Achievement: 10/25/23 Potential to Achieve Goals: Good   OT Frequency:  Min 1X/week    Co-evaluation              AM-PAC OT "6 Clicks" Daily Activity     Outcome Measure Help from another person eating meals?: A Little Help from another person taking care of personal grooming?: A Little Help from another person toileting, which includes using toliet, bedpan, or urinal?: A Little Help from another person bathing (including washing, rinsing, drying)?: A Little Help from another person to put on and taking off regular upper body clothing?: A Little Help from another person to put on and taking off regular lower body clothing?: A Lot 6 Click Score: 17   End of Session Equipment Utilized During Treatment: Gait belt;Other (comment) (hemi walker) Nurse Communication: Mobility status  Activity Tolerance: Patient tolerated treatment well Patient left: in bed;with call bell/phone within reach;with bed  alarm set  OT Visit Diagnosis: Unsteadiness on feet (R26.81);Muscle weakness (generalized) (M62.81);History of falling (Z91.81);Other abnormalities of gait and mobility (R26.89);Other symptoms and signs involving cognitive function;Hemiplegia and hemiparesis Hemiplegia - Right/Left: Left (chronic since 2008) Hemiplegia - caused by: Cerebral infarction                Time: 1419-1445 OT Time Calculation (min): 26 min Charges:  OT General Charges $OT Visit: 1 Visit OT  Evaluation $OT Eval Moderate Complexity: 1 Mod OT Treatments $Self Care/Home Management : 8-22 mins  Karilyn Ouch, OTR/L [facility] Acute Rehabilitation Office: [phone]   Emery Hans 10/11/2023, 4:01 PM

## 2023-10-11 NOTE — Evaluation (Signed)
 Speech Language Pathology Evaluation Patient Details Name: [name] MRN: [id number] DOB: [birth year] Today's Date: 10/11/2023 Time: 3244-0102 SLP Time Calculation (min) (ACUTE ONLY): 12 min  Problem List:  Patient Active Problem List   Diagnosis Date Noted   Hypomagnesemia 10/09/2023   Elevated lactic acid level 10/09/2023   History of CVA (cerebrovascular accident) 10/09/2023   Chronic diastolic CHF (congestive heart failure) (HCC) 10/09/2023   Hypotension 10/09/2023   Sinus bradycardia 10/09/2023   History of prostate cancer 10/09/2023   Acute metabolic encephalopathy 10/09/2023   Regurgitation of food 04/02/2023   Transportation insecurity 03/13/2023   Prostate cancer (HCC) 09/29/2022   Complication of Foley catheter (HCC) 09/29/2022   Hydronephrosis    Obstructive uropathy 11/15/2021   Physical deconditioning 11/27/2020   Dysequilibrium 11/27/2020   Muscle wasting 09/25/2017   Depression 07/14/2017   Dysarthria as late effect of stroke 08/18/2016   Essential hypertension    Spastic hemiparesis (HCC)    Altered mental status 06/30/2016   Acute encephalopathy 06/30/2016   Stage 2 chronic kidney disease 06/30/2016   Cerebrovascular accident (CVA) (HCC) 10/23/2015   PAD (peripheral artery disease) s/p DES stent and angioplasty (HCC) 10/20/2011   Onychomycosis of great toe 09/10/2011   Hyperlipidemia 02/21/2010   CEREBROVASCULAR ACCIDENT, HX OF 12/22/2008   Insulin  dependent type 2 diabetes mellitus (HCC) 07/30/2006   OBESITY, NOS 07/30/2006   Essential hypertension, benign 07/30/2006   Past Medical History:  Past Medical History:  Diagnosis Date   CVA (cerebral infarction) 01/03/2008   MRI: Acute 1 x 1.5 cm infarction affecting the left side of the pons.   Diabetes mellitus    DVT (deep venous thrombosis) (HCC)    Hypertension    PAD (peripheral artery disease) (HCC)    Stroke (HCC)    2008   Past Surgical History:  Past Surgical History:  Procedure  Laterality Date   ABDOMINAL AORTOGRAM W/LOWER EXTREMITY Right 08/19/2023   Procedure: ABDOMINAL AORTOGRAM W/LOWER EXTREMITY;  Surgeon: Kayla Part, MD;  Location: [facility] INVASIVE CV LAB;  Service: Cardiovascular;  Laterality: Right;   COLONOSCOPY     LOWER EXTREMITY ANGIOGRAPHY Right 08/19/2023   Procedure: Lower Extremity Angiography;  Surgeon: Kayla Part, MD;  Location: [facility] INVASIVE CV LAB;  Service: Cardiovascular;  Laterality: Right;   LOWER EXTREMITY INTERVENTION Right 08/19/2023   Procedure: LOWER EXTREMITY INTERVENTION;  Surgeon: Kayla Part, MD;  Location: [facility] INVASIVE CV LAB;  Service: Cardiovascular;  Laterality: Right;   None     HPI:  [name] is a 73 yo male presenting to ED with R sided weakness, aphasia, dysarthria, and decreased LOC. MRI shows small acute or subacute infarct in the L corona radiata. Seen by SLP in 2018 for goals related to safety awareness, self-monitoring, and dysarthria. Goals for dysphagia in 2018 and 2023 were targeting use of a lingual sweep to clear R buccal cavity. PMH includes prior L ACA CVA (2018) with residual R sided weakness, dysarthria, HTN, T2DM, PVD s/p SFA stenting and anterior tibial balloon angioplasty 08/19/23, chronic diastolic HFpEF, prostate cancer   Assessment / Plan / Recommendation Clinical Impression  Pt reports a history of cognitive impairment s/p prior stroke and denies acute concerns. He states he lives alone but his son is frequently available to assist as needed. Portions of the SLUMS were deferred given RUE weakness. Of the remaining 26 points, pt scored 20 characterized by deficits related to problem solving and attention. Although this score falls below the normal range, pt  states this is consistent with baseline. No further SLP f/u is indicated at this time but could be considered post-acutely. Pt verbalizes understanding. Will sign off.    SLP Assessment  SLP Recommendation/Assessment: All further Speech Lanaguage  Pathology  needs can be addressed in the next venue of care SLP Visit Diagnosis: Cognitive communication deficit (R41.841)    Recommendations for follow up therapy are one component of a multi-disciplinary discharge planning process, led by the attending physician.  Recommendations may be updated based on patient status, additional functional criteria and insurance authorization.    Follow Up Recommendations  Outpatient SLP    Assistance Recommended at Discharge  Intermittent Supervision/Assistance  Functional Status Assessment Patient has not had a recent decline in their functional status  Frequency and Duration           SLP Evaluation Cognition  Overall Cognitive Status: History of cognitive impairments - at baseline Arousal/Alertness: Awake/alert Orientation Level: Oriented X4 Attention: Sustained Sustained Attention: Impaired Sustained Attention Impairment: Verbal basic Memory: Appears intact Awareness: Appears intact Problem Solving: Impaired Problem Solving Impairment: Verbal basic       Comprehension  Auditory Comprehension Overall Auditory Comprehension: Appears within functional limits for tasks assessed    Expression Expression Primary Mode of Expression: Verbal Verbal Expression Overall Verbal Expression: Appears within functional limits for tasks assessed   Oral / Motor  Oral Motor/Sensory Function Overall Oral Motor/Sensory Function: Mild impairment Facial ROM: Reduced right;Suspected CN VII (facial) dysfunction Facial Symmetry: Abnormal symmetry right;Suspected CN VII (facial) dysfunction Facial Strength: Reduced right;Suspected CN VII (facial) dysfunction Motor Speech Overall Motor Speech: Appears within functional limits for tasks assessed            Amil Kale, M.A., CCC-SLP Speech Language Pathology, Acute Rehabilitation Services  Secure Chat preferred [phone]  10/11/2023, 2:46 PM

## 2023-10-11 NOTE — Progress Notes (Addendum)
 STROKE TEAM PROGRESS NOTE    INTERIM HISTORY/SUBJECTIVE  Patient has been hemodynamically stable with Tmax of 99.5.  He reports no new symptoms from his recent stroke.  OBJECTIVE  CBC    Component Value Date/Time   WBC 11.1 (H) 10/11/2023 0718   RBC 3.79 (L) 10/11/2023 0718   HGB 11.3 (L) 10/11/2023 0718   HCT 34.7 (L) 10/11/2023 0718   PLT 173 10/11/2023 0718   MCV 91.6 10/11/2023 0718   MCH 29.8 10/11/2023 0718   MCHC 32.6 10/11/2023 0718   RDW 13.1 10/11/2023 0718   LYMPHSABS 1.5 10/09/2023 1615   MONOABS 0.5 10/09/2023 1615   EOSABS 0.1 10/09/2023 1615   BASOSABS 0.1 10/09/2023 1615    BMET    Component Value Date/Time   NA 140 10/11/2023 0718   NA 137 03/13/2023 1651   K 3.4 (L) 10/11/2023 0718   CL 105 10/11/2023 0718   CO2 27 10/11/2023 0718   GLUCOSE 99 10/11/2023 0718   BUN 13 10/11/2023 0718   BUN 29 (H) 03/13/2023 1651   CREATININE 0.82 10/11/2023 0718   CREATININE 1.02 11/05/2015 1203   CALCIUM  8.5 (L) 10/11/2023 0718   EGFR 54 (L) 03/13/2023 1651   GFRNONAA >60 10/11/2023 0718   GFRNONAA 77 11/05/2015 1203    IMAGING past 24 hours CT ANGIO HEAD NECK W WO CM Result Date: 10/10/2023 CLINICAL DATA:  Follow-up examination for stroke. EXAM: CT ANGIOGRAPHY HEAD AND NECK WITH AND WITHOUT CONTRAST TECHNIQUE: Multidetector CT imaging of the head and neck was performed using the standard protocol during bolus administration of intravenous contrast. Multiplanar CT image reconstructions and MIPs were obtained to evaluate the vascular anatomy. Carotid stenosis measurements (when applicable) are obtained utilizing NASCET criteria, using the distal internal carotid diameter as the denominator. RADIATION DOSE REDUCTION: This exam was performed according to the departmental dose-optimization program which includes automated exposure control, adjustment of the mA and/or kV according to patient size and/or use of iterative reconstruction technique. CONTRAST:  75mL OMNIPAQUE   IOHEXOL  350 MG/ML SOLN COMPARISON:  MRI from earlier the same day. FINDINGS: CT HEAD FINDINGS Brain: Age-related cerebral atrophy with moderate chronic microvascular ischemic disease. Previously noted small left cerebral infarcts not visible by CT. No other acute large vessel territory infarct. No acute intracranial hemorrhage. No mass lesion, midline shift or mass effect. No hydrocephalus or extra-axial fluid collection. Retro cerebellar cyst versus mega cisterna magna noted. Vascular: No abnormal hyperdense vessel. Calcified atherosclerosis present at the skull base. Skull: Scalp soft tissues and calvarium demonstrate no acute or significant finding. Sinuses/Orbits: Globes orbital soft tissues within normal limits. Scattered mucosal thickening noted about the paranasal sinuses. No air-fluid levels. Mastoid air cells are clear. Other: None. Review of the MIP images confirms the above findings CTA NECK FINDINGS Aortic arch: Visualized aortic arch within normal limits for caliber. Moderate aortic atherosclerosis. Bovine branching pattern noted. No significant stenosis about the origin the great vessels. Right carotid system: Right common and internal carotid arteries are patent without dissection. Bulky calcified plaque about the right carotid bulb with associated stenosis of up to 60% by NASCET criteria. Calcified plaque at the distal cervical right ICA with additional 75% stenosis by NASCET criteria (series 9, image 141). Left carotid system: Left common and internal carotid arteries are patent without dissection. Scattered nonstenotic plaque within the left CCA without significant stenosis. Calcified plaque about the left carotid bulb with associated stenosis of up to 45% by NASCET criteria. Vertebral arteries: Both vertebral arteries arise from the  subclavian arteries. 65% atheromatous stenosis noted at the proximal left subclavian artery (series 9, image 312). Atheromatous change about the origins of both  vertebral arteries with severe stenoses bilaterally. Few additional mild-to-moderate multifocal right P2 stenoses noted. No dissection. Skeleton: No worrisome osseous lesions. Mild-to-moderate spondylosis at C5-6 and C6-7. Other neck: No other acute finding. Upper chest: No other acute finding. Review of the MIP images confirms the above findings CTA HEAD FINDINGS Anterior circulation: Extensive atheromatous change throughout the carotid siphons bilaterally. Associated focal severe stenosis at the horizontal petrous right ICA (series 9, image 126). Additional moderate to severe diffuse narrowing about the right greater than left siphons distally. A1 segments patent. Left A1 slightly dominant. Normal anterior communicating artery complex. Bilateral severe distal A2/A3 stenoses noted (series 9, image 88). ACAs remain patent to their distal aspects. Atheromatous irregularity about the M1 segments with up to mild stenosis bilaterally. No proximal MCA branch occlusion. Distal MCA branches perfused and fairly symmetric. Posterior circulation: Atheromatous change about the V4 segments bilaterally. Associated severe distal right V4 stenosis (series 15, image 23). Left PICA patent at its origin. Right PICA not seen. Basilar patent without significant stenosis. Superior cerebral arteries patent bilaterally. Both PCAs primarily supplied via the basilar. Atheromatous irregularity throughout the PCAs bilaterally. Associated moderate proximal right P2 stenosis (series 14, image 56). Severe distal left P2 stenosis (series 14, image 57). PCAs remain patent to their distal aspects. Venous sinuses: Patent allowing for timing the contrast bolus. Anatomic variants: As above.  No aneurysm. Review of the MIP images confirms the above findings IMPRESSION: CT HEAD: 1. No acute intracranial abnormality. Previously noted small left cerebral infarct not visible by CT. 2. Age-related cerebral atrophy with moderate chronic microvascular  ischemic disease. CTA HEAD AND NECK: 1. Negative CTA for acute large vessel occlusion or other emergent finding. 2. 60% atheromatous stenosis at the right carotid bulb, with additional 75% stenosis at the distal cervical right ICA at the skull base. 3. 45% atheromatous stenosis at the left carotid bulb. 4. Advanced atheromatous change about the carotid siphons with associated moderate to severe stenosis bilaterally, right worse than left. Additional intracranial atherosclerotic disease with associated moderate to severe bilateral A2/A3 and P2 stenoses as above. 5. Severe stenoses at the origins of both vertebral arteries, with additional severe distal right V4 stenosis. 6. 65% atheromatous stenosis at the proximal left subclavian artery. 7.  Aortic Atherosclerosis (ICD10-I70.0). Electronically Signed   By: Virgia Griffins M.D.   On: 10/10/2023 20:40    Vitals:   10/10/23 0729 10/10/23 1934 10/10/23 2330 10/11/23 0416  BP: (!) 184/66 (!) 179/63 (!) 140/68 (!) 172/62  Pulse: 67 74 71 71  Resp: 18 18    Temp: 98.3 F (36.8 C) 98.7 F (37.1 C) 99.5 F (37.5 C) 99.3 F (37.4 C)  TempSrc: Oral Oral Oral Axillary  SpO2: 100% 99% 96% 100%     PHYSICAL EXAM General: Chronically ill-appearing, elderly patient in no acute distress Psych:  Mood and affect appropriate for situation CV: Regular rate and rhythm on monitor Respiratory:  Regular, unlabored respirations on room air ]  NEURO:  Mental Status: AA&Ox3, patient is able to give clear and coherent history Speech/Language: speech is with some dysarthria but without aphasia  Cranial Nerves:  II: PERRL. Visual fields full.  III, IV, VI: EOMI. Eyelids elevate symmetrically.  V: Sensation is intact to light touch and symmetrical to face.  VII: Right facial droop VIII: hearing intact to voice. IX, X: Voice is dysarthric  GL:OVFIEPPI shrug stronger on the left XII: tongue is midline without fasciculations. Motor: Able to move left upper  and lower extremities with good antigravity strength, good antigravity strength in right lower extremity, can move right upper extremity but cannot lift it off the bed Tone: is normal and bulk is normal Sensation- Intact to light touch bilaterally.  Coordination: FTN intact on the left Gait- deferred  Most Recent NIH  1a Level of Conscious.: 0 1b LOC Questions: 0 1c LOC Commands: 0 2 Best Gaze: 0 3 Visual: 0 4 Facial Palsy: 1 5a Motor Arm - left: 0 5b Motor Arm - Right: 3 6a Motor Leg - Left: 0 6b Motor Leg - Right: 0 7 Limb Ataxia: 0 8 Sensory: 0 9 Best Language: 0 10 Dysarthria: 1 11 Extinct. and Inatten.: 0 TOTAL: 5   ASSESSMENT/PLAN  [name] is a 73 y.o. male with history of stroke with residual right-sided weakness, hypertension and diabetes originally presented with right-sided weakness, dysarthria and decreased level of consciousness.  He was found to have a UTI and is being treated appropriately for this and was found to have an incidental stroke in the left corona radiata on MRI.  He reports that he is at baseline and has no new deficits from the stroke.    Acute Ischemic Infarct:  left corona radiata incidental stroke Etiology: Small vessel disease in the setting of hypotension with UTI CT head No acute abnormality. Small vessel disease. Atrophy.  CTA head & neck no LVO, 60% stenosis at right carotid bulb with 75% stenosis in cervical right ICA at the skull base, 45% stenosis of the left carotid bulb, moderate to severe severe stenosis of the bilateral carotid siphons, moderate to severe stenosis at bilateral A2/A3 and P2, severe stenosis at origins of of bilateral vertebral arteries MRI small acute or subacute infarct in left corona radiata, chronic small vessel ischemic disease 2D Echo pending LDL 43 HgbA1c 8.0 VTE prophylaxis -SCDs aspirin  81 mg daily and clopidogrel  75 mg daily prior to admission, now on aspirin  81 mg daily and clopidogrel  75 mg daily  indefinitely due to severe multifocal intracranial stenosis Therapy recommendations:  Pending Disposition: pending  Hx of Stroke/TIA Patient has a history of a stroke 06/2016 with residual right-sided weakness Patient has a history of a left pontine infarct in 2009  Hypertension Home meds: P-olmesartan  10-20 daily, carvedilol  12.5 mg twice daily Stable Blood Pressure Goal: BP less than 220/110   Hyperlipidemia Home meds: Atorvastatin  40 mg daily, resumed in hospital LDL 43, goal < 70 Continue statin at discharge  Diabetes type II Uncontrolled Home meds: Jardiance  25 mg daily, insulin  glargine 18 units daily HgbA1c 8.0, goal < 7.0 CBGs SSI Recommend close follow-up with PCP for better DM control  Other Stroke Risk Factors Advanced age   Other Active Problems UTI-treatment per primary team  Hospital day # 2  Patient seen by NP and then by MD, MD to edit note as needed. [name] , MSN, AGACNP-BC [facility] See Amion for schedule and pager information 10/11/2023 12:23 PM    ATTENDING ATTESTATION:  73 year old with history of previous stroke hypertension diabetes with baseline right-sided weakness presented with worsening weakness in the same side found to have incidental small left corona radiata stroke.  He feels that he is back to his baseline with his dysarthria and right-sided weakness.  He has no specific complaints.  DAPT indefinitely as stated above.  Echo is negative for PFO  and thrombus. EF 70%   neurology will sign off  Follow-up in stroke clinic in 6 to 8 weeks after discharge  Dr. Dahlia Dross evaluated pt independently, reviewed imaging, chart, labs. Discussed and formulated plan with the Resident/APP. Changes were made to the note where appropriate. Please see APP/resident note above for details.   Total 36 minutes spent on counseling patient and coordinating care, writing notes and reviewing chart.     Kyle Clinch,MD   To  contact Stroke Continuity provider, please refer to [email or web address]. After hours, contact General Neurology

## 2023-10-11 NOTE — Care Management Obs Status (Signed)
 MEDICARE OBSERVATION STATUS NOTIFICATION   Patient Details  Name: [name] MRN: [id number] Date of Birth: [birth year]   Medicare Observation Status Notification Given:  Yes    Xavyer Steenson G., RN 10/11/2023, 11:40 AM

## 2023-10-11 NOTE — Care Management CC44 (Signed)
 Condition Code 44 Documentation Completed  Patient Details  Name: [name] MRN: [id number] Date of Birth: 1951/05/04   Condition Code 44 given:   yes Patient signature on Condition Code 44 notice:   yes Documentation of 2 MD's agreement:   yes Code 44 added to claim:  yes     Lesleigh Hughson G., RN 10/11/2023, 11:43 AM

## 2023-10-11 NOTE — Progress Notes (Signed)
 Echocardiogram 2D Echocardiogram has been performed.  [name] 10/11/2023, 4:47 PM

## 2023-10-11 NOTE — Plan of Care (Signed)
  Problem: Nutritional: Goal: Maintenance of adequate nutrition will improve Outcome: Progressing Goal: Progress toward achieving an optimal weight will improve Outcome: Progressing   Problem: Skin Integrity: Goal: Risk for impaired skin integrity will decrease Outcome: Progressing   Problem: Clinical Measurements: Goal: Ability to maintain clinical measurements within normal limits will improve Outcome: Progressing Goal: Will remain free from infection Outcome: Progressing Goal: Diagnostic test results will improve Outcome: Progressing Goal: Respiratory complications will improve Outcome: Progressing Goal: Cardiovascular complication will be avoided Outcome: Progressing   Problem: Elimination: Goal: Will not experience complications related to bowel motility Outcome: Progressing Goal: Will not experience complications related to urinary retention Outcome: Progressing   Problem: Safety: Goal: Ability to remain free from injury will improve Outcome: Progressing   Problem: Pain Managment: Goal: General experience of comfort will improve and/or be controlled Outcome: Progressing

## 2023-10-11 NOTE — Evaluation (Signed)
 Physical Therapy Evaluation Patient Details Name: [name] MRN: [id number] DOB: 11-Jun-1950 Today's Date: 10/11/2023  History of Present Illness  Pt os a 73 y.o. male presenting 5/9 from nuclear medicine with stroke like symptoms. 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  Clinical Impression  The patient does not require much assist (min at most) to mobilize with [facility] or hemi walker, however, he moves slowly and ran into obstacles on his right side.  He is not safe to return home alone and would be safest with 24/7 supervision if he discharges home (which is his preference).  He reports he has family that can help, but indicated that they would be in and out.  He was residing in a SNF prior to admission and would benefit from going back to SNF to do more therapy if his family cannot provide 24/7 supervision he needs to go home safely.   PT to follow acutely for deficits listed below.         If plan is discharge home, recommend the following: A lot of help with bathing/dressing/bathroom;A little help with walking and/or transfers;Assistance with cooking/housework;Assist for transportation;Help with stairs or ramp for entrance   Can travel by private vehicle   No    Equipment Recommendations Other (comment) (hemi walker for home if he goes home)  Recommendations for Other Services       Functional Status Assessment Patient has had a recent decline in their functional status and demonstrates the ability to make significant improvements in function in a reasonable and predictable amount of time.     Precautions / Restrictions Precautions Precautions: Fall Restrictions Weight Bearing Restrictions Per Provider Order: No      Mobility  Bed Mobility Overal bed mobility: Needs Assistance Bed Mobility: Supine to Sit, Sit to Supine     Supine to sit: Min assist Sit to  supine: Min assist   General bed mobility comments: Min assist to support trunk and min assist to lift right leg back into bed when going to supine    Transfers Overall transfer level: Needs assistance Equipment used: Hemi-walker, None Transfers: Sit to/from Stand, Bed to chair/wheelchair/BSC Sit to Stand: Min assist   Step pivot transfers: Min assist       General transfer comment: Min guard assist to transfer from bed to [facility] (as he reports this is his main form of mobility at SNF) also used hemi walker as he states he uses this too for gait.    Ambulation/Gait Ambulation/Gait assistance: Min assist Gait Distance (Feet): 20 Feet Assistive device: Hemi-walker Gait Pattern/deviations: Step-to pattern, Steppage Gait velocity: decreased Gait velocity interpretation: <1.31 ft/sec, indicative of household ambulator   General Gait Details: Pt able to use hemi walker to walk from door to bed with min to min guard assist, pt with steppage gait pattern to clear right foot, assist for safety on level surface with no rugs.  Stairs            [employer] mobility: Yes Wheelchair propulsion: Left upper extremity, Both lower extermities Wheelchair parts: Supervision/cueing Distance: 200 Wheelchair Assistance Details (indicate cue type and reason): Pt was running into things on his right side, running into the sink and the door frame with right elbow as he attempted to exit the room, likely some visual deficits or inattention to that side.   Tilt Bed    Modified Rankin (Stroke  Patients Only) Modified Rankin (Stroke Patients Only) Pre-Morbid Rankin Score: Moderately severe disability Modified Rankin: Severe disability     Balance Overall balance assessment: Needs assistance Sitting-balance support: Single extremity supported, No upper extremity supported Sitting balance-Leahy Scale: Fair Sitting balance - Comments: supervision EOB    Standing balance support: Single extremity supported, During functional activity, No upper extremity supported Standing balance-Leahy Scale: Fair Standing balance comment: statically supervision                             Pertinent Vitals/Pain Pain Assessment Pain Assessment: No/denies pain    Home Living Family/patient expects to be discharged to:: Private residence (from SNF where he is not currently active with therapy, pt wants to go home) Living Arrangements: Alone Available Help at Discharge: Family;Friend(s);Available PRN/intermittently Type of Home: House Home Access: Ramped entrance       Home Layout: One level Home Equipment: Cane - quad;BSC/3in1;Tub bench;Rollator (4 wheels);Rolling Walker (2 wheels);Other (comment) Additional Comments: per chart from 2024, PCA comes 2 hours/day x5 days/week, but pt report recently at SNF for rehab and when asked PLOF, pt did not mention this    Prior Function Prior Level of Function : Needs assist             Mobility Comments: uses hemi walker with mod I ADLs Comments: Reports he cooks and cleans, sponge bathes, and dresses himself. He does not drive     Extremity/Trunk Assessment   Upper Extremity Assessment Upper Extremity Assessment: Defer to OT evaluation RUE Deficits / Details: R hemi at baseline, pt reports his arm has been like this since CVA in 2008. Pt with elbow flexion contracture. RUE Coordination: decreased fine motor;decreased gross motor    Lower Extremity Assessment Lower Extremity Assessment: RLE deficits/detail RLE Deficits / Details: right leg with increased tone, spasticity and tendency towards flexion synergy RLE Sensation: decreased light touch RLE Coordination: decreased gross motor;decreased fine motor    Cervical / Trunk Assessment Cervical / Trunk Assessment: Other exceptions Cervical / Trunk Exceptions: trunk muscle inequity due to h/o stroke  Communication    Communication Communication: Impaired Factors Affecting Communication: Difficulty expressing self    Cognition Arousal: Alert Behavior During Therapy: WFL for tasks assessed/performed                             Following commands: Intact       Cueing Cueing Techniques: Verbal cues     General Comments      Exercises     Assessment/Plan    PT Assessment Patient needs continued PT services  PT Problem List Decreased strength;Decreased range of motion;Decreased activity tolerance;Decreased balance;Decreased mobility;Decreased knowledge of use of DME;Impaired tone       PT Treatment Interventions DME instruction;Gait training;Functional mobility training;Therapeutic activities;Therapeutic exercise;Balance training;Patient/family education;Wheelchair mobility training    PT Goals (Current goals can be found in the Care Plan section)  Acute Rehab PT Goals Patient Stated Goal: to return to his home and not return to SNF PT Goal Formulation: With patient Time For Goal Achievement: 10/25/23 Potential to Achieve Goals: Good    Frequency Min 1X/week     Co-evaluation               AM-PAC PT "6 Clicks" Mobility  Outcome Measure Help needed turning from your back to your side while in a flat bed without using bedrails?: A Little Help needed  moving from lying on your back to sitting on the side of a flat bed without using bedrails?: A Little Help needed moving to and from a bed to a chair (including a wheelchair)?: A Little Help needed standing up from a chair using your arms (e.g., wheelchair or bedside chair)?: A Little Help needed to walk in hospital room?: Total Help needed climbing 3-5 steps with a railing? : Total 6 Click Score: 14    End of Session Equipment Utilized During Treatment: Gait belt Activity Tolerance: Patient tolerated treatment well Patient left: in bed;with call bell/phone within reach;with bed alarm set   PT Visit Diagnosis: Muscle  weakness (generalized) (M62.81);Difficulty in walking, not elsewhere classified (R26.2);Hemiplegia and hemiparesis Hemiplegia - Right/Left: Right Hemiplegia - dominant/non-dominant: Dominant Hemiplegia - caused by: Cerebral infarction    Time: 1713-1733 PT Time Calculation (min) (ACUTE ONLY): 20 min   Charges:   PT Evaluation $PT Eval Moderate Complexity: 1 Mod   PT General Charges $$ ACUTE PT VISIT: 1 Visit       Alveria Avena, PT, DPT  Acute Rehabilitation Secure chat is best for contact #[phone] office

## 2023-10-12 ENCOUNTER — Other Ambulatory Visit: Payer: Self-pay

## 2023-10-12 DIAGNOSIS — C61 Malignant neoplasm of prostate: Secondary | ICD-10-CM | POA: Diagnosis not present

## 2023-10-12 DIAGNOSIS — K219 Gastro-esophageal reflux disease without esophagitis: Secondary | ICD-10-CM | POA: Diagnosis not present

## 2023-10-12 DIAGNOSIS — I6930 Unspecified sequelae of cerebral infarction: Secondary | ICD-10-CM | POA: Diagnosis not present

## 2023-10-12 DIAGNOSIS — I10 Essential (primary) hypertension: Secondary | ICD-10-CM | POA: Diagnosis not present

## 2023-10-12 DIAGNOSIS — R278 Other lack of coordination: Secondary | ICD-10-CM | POA: Diagnosis not present

## 2023-10-12 DIAGNOSIS — E785 Hyperlipidemia, unspecified: Secondary | ICD-10-CM | POA: Diagnosis not present

## 2023-10-12 DIAGNOSIS — M4802 Spinal stenosis, cervical region: Secondary | ICD-10-CM | POA: Diagnosis not present

## 2023-10-12 DIAGNOSIS — I739 Peripheral vascular disease, unspecified: Secondary | ICD-10-CM

## 2023-10-12 DIAGNOSIS — F32A Depression, unspecified: Secondary | ICD-10-CM | POA: Diagnosis not present

## 2023-10-12 DIAGNOSIS — R531 Weakness: Secondary | ICD-10-CM | POA: Diagnosis not present

## 2023-10-12 DIAGNOSIS — L97812 Non-pressure chronic ulcer of other part of right lower leg with fat layer exposed: Secondary | ICD-10-CM | POA: Diagnosis not present

## 2023-10-12 DIAGNOSIS — N139 Obstructive and reflux uropathy, unspecified: Secondary | ICD-10-CM | POA: Diagnosis not present

## 2023-10-12 DIAGNOSIS — R338 Other retention of urine: Secondary | ICD-10-CM | POA: Diagnosis not present

## 2023-10-12 DIAGNOSIS — M25461 Effusion, right knee: Secondary | ICD-10-CM | POA: Diagnosis not present

## 2023-10-12 DIAGNOSIS — I69320 Aphasia following cerebral infarction: Secondary | ICD-10-CM | POA: Diagnosis not present

## 2023-10-12 DIAGNOSIS — F339 Major depressive disorder, recurrent, unspecified: Secondary | ICD-10-CM | POA: Diagnosis not present

## 2023-10-12 DIAGNOSIS — W19XXXA Unspecified fall, initial encounter: Secondary | ICD-10-CM | POA: Diagnosis not present

## 2023-10-12 DIAGNOSIS — G8191 Hemiplegia, unspecified affecting right dominant side: Secondary | ICD-10-CM | POA: Diagnosis not present

## 2023-10-12 DIAGNOSIS — R1311 Dysphagia, oral phase: Secondary | ICD-10-CM | POA: Diagnosis not present

## 2023-10-12 DIAGNOSIS — G9341 Metabolic encephalopathy: Secondary | ICD-10-CM | POA: Diagnosis not present

## 2023-10-12 DIAGNOSIS — L97215 Non-pressure chronic ulcer of right calf with muscle involvement without evidence of necrosis: Secondary | ICD-10-CM | POA: Diagnosis not present

## 2023-10-12 DIAGNOSIS — M6281 Muscle weakness (generalized): Secondary | ICD-10-CM | POA: Diagnosis not present

## 2023-10-12 DIAGNOSIS — F331 Major depressive disorder, recurrent, moderate: Secondary | ICD-10-CM | POA: Diagnosis not present

## 2023-10-12 DIAGNOSIS — E119 Type 2 diabetes mellitus without complications: Secondary | ICD-10-CM | POA: Diagnosis not present

## 2023-10-12 DIAGNOSIS — I639 Cerebral infarction, unspecified: Secondary | ICD-10-CM | POA: Diagnosis not present

## 2023-10-12 DIAGNOSIS — E118 Type 2 diabetes mellitus with unspecified complications: Secondary | ICD-10-CM | POA: Diagnosis not present

## 2023-10-12 LAB — BASIC METABOLIC PANEL WITH GFR
Anion gap: 8 (ref 5–15)
BUN: 25 mg/dL — ABNORMAL HIGH (ref 8–23)
CO2: 24 mmol/L (ref 22–32)
Calcium: 8.5 mg/dL — ABNORMAL LOW (ref 8.9–10.3)
Chloride: 107 mmol/L (ref 98–111)
Creatinine, Ser: 0.86 mg/dL (ref 0.61–1.24)
GFR, Estimated: >60 mL/min (ref >60)
Glucose, Bld: 133 mg/dL — ABNORMAL HIGH (ref 70–99)
Potassium: 4.3 mmol/L (ref 3.5–5.1)
Sodium: 139 mmol/L (ref 135–145)

## 2023-10-12 LAB — GLUCOSE, CAPILLARY
Glucose-Capillary: 141 mg/dL — ABNORMAL HIGH (ref 70–99)
Glucose-Capillary: 126 mg/dL — ABNORMAL HIGH (ref 70–99)

## 2023-10-12 LAB — MAGNESIUM: Magnesium: 1.9 mg/dL (ref 1.7–2.4)

## 2023-10-12 MED ORDER — ACETAMINOPHEN 325 MG PO TABS: 650.0000 mg | ORAL_TABLET | Freq: Four times a day (QID) | ORAL | Status: AC | PRN

## 2023-10-12 MED ORDER — AMLODIPINE-OLMESARTAN 10-20 MG PO TABS: 1.0000 | ORAL_TABLET | Freq: Every day | ORAL | Status: DC

## 2023-10-12 MED ORDER — AMLODIPINE BESYLATE 5 MG PO TABS
10.0000 mg | ORAL_TABLET | Freq: Every day | ORAL | Status: DC
  Administered 2023-10-12: 10 mg via ORAL
  Filled 2023-10-12: qty 2

## 2023-10-12 MED ORDER — INSULIN ASPART 100 UNIT/ML IJ SOLN
0.0000 [IU] | Freq: Three times a day (TID) | INTRAMUSCULAR | Status: AC
Start: 2023-10-12 — End: ?

## 2023-10-12 MED ORDER — INSULIN ASPART 100 UNIT/ML IJ SOLN: 0.0000 [IU] | Freq: Every day | INTRAMUSCULAR | Status: AC

## 2023-10-12 MED ORDER — IRBESARTAN 150 MG PO TABS
150.0000 mg | ORAL_TABLET | Freq: Every day | ORAL | Status: DC
  Administered 2023-10-12: 150 mg via ORAL
  Filled 2023-10-12: qty 1

## 2023-10-12 MED ORDER — CARVEDILOL 12.5 MG PO TABS: 12.5000 mg | ORAL_TABLET | Freq: Two times a day (BID) | ORAL | Status: DC

## 2023-10-12 MED ORDER — INSULIN GLARGINE-YFGN 100 UNIT/ML ~~LOC~~ SOLN: 12.0000 [IU] | Freq: Every day | SUBCUTANEOUS | Status: AC

## 2023-10-12 NOTE — [place] FL2 (Signed)
 [place]  MEDICAID FL2 LEVEL OF CARE FORM     IDENTIFICATION  Patient Name: [name] Birthdate: 05/02/1951 Sex: male Admission Date (Current Location): 10/09/2023  [facility] and [place] Number:  Producer, television/film/video and Address:  [facility] [place] [address], [place], [place] [address]      Provider Number: [id number]  Attending Physician Name and Address:  Abbe Abate, MD  Relative Name and Phone Number:       Current Level of Care: Hospital Recommended Level of Care: Skilled Nursing Facility Prior Approval Number:    Date Approved/Denied:   PASRR Number:    Discharge Plan: SNF    Current Diagnoses: Patient Active Problem List   Diagnosis Date Noted   Hypomagnesemia 10/09/2023   Elevated lactic acid level 10/09/2023   History of CVA (cerebrovascular accident) 10/09/2023   Chronic diastolic CHF (congestive heart failure) (HCC) 10/09/2023   Hypotension 10/09/2023   Sinus bradycardia 10/09/2023   History of prostate cancer 10/09/2023   Acute metabolic encephalopathy 10/09/2023   Regurgitation of food 04/02/2023   Transportation insecurity 03/13/2023   Prostate cancer (HCC) 09/29/2022   Complication of Foley catheter (HCC) 09/29/2022   Hydronephrosis    Obstructive uropathy 11/15/2021   Physical deconditioning 11/27/2020   Dysequilibrium 11/27/2020   Muscle wasting 09/25/2017   Depression 07/14/2017   Dysarthria as late effect of stroke 08/18/2016   Essential hypertension    Spastic hemiparesis (HCC)    Altered mental status 06/30/2016   Acute encephalopathy 06/30/2016   Stage 2 chronic kidney disease 06/30/2016   Cerebrovascular accident (CVA) (HCC) 10/23/2015   PAD (peripheral artery disease) s/p DES stent and angioplasty (HCC) 10/20/2011   Onychomycosis of great toe 09/10/2011   Hyperlipidemia 02/21/2010   CEREBROVASCULAR ACCIDENT, HX OF 12/22/2008   Insulin  dependent type 2 diabetes mellitus (HCC) 07/30/2006   OBESITY,  NOS 07/30/2006   Essential hypertension, benign 07/30/2006    Orientation RESPIRATION BLADDER Height & Weight     Self, Time, Situation, Place  Normal Incontinent, Indwelling catheter Weight:   Height:     BEHAVIORAL SYMPTOMS/MOOD NEUROLOGICAL BOWEL NUTRITION STATUS      Continent Diet (heart healthy/carb modified)  AMBULATORY STATUS COMMUNICATION OF NEEDS Skin   Limited Assist Verbally PU Stage and Appropriate Care   PU Stage 2 Dressing:  (right pretibial distal: foam dressing, lift every shift to assess and change PRN)                   Personal Care Assistance Level of Assistance  Bathing, Feeding, Dressing Bathing Assistance: Limited assistance Feeding assistance: Limited assistance Dressing Assistance: Limited assistance     Functional Limitations Info  Sight Sight Info: Impaired (blurred vision)        SPECIAL CARE FACTORS FREQUENCY  PT (By licensed PT), OT (By licensed OT)     PT Frequency: 5x/wk OT Frequency: 5x/wk            Contractures Contractures Info: Not present    Additional Factors Info  Code Status, Allergies, Insulin  Sliding Scale Code Status Info: Full Allergies Info: Beef-derived Drug Products, Pork Allergy, Shrimp Flavor Agent (Non-screening)   Insulin  Sliding Scale Info: see DC summary       Current Medications (10/12/2023):  This is the current hospital active medication list Current Facility-Administered Medications  Medication Dose Route Frequency Provider Last Rate Last Admin   acetaminophen  (TYLENOL ) tablet 650 mg  650 mg Oral Q6H PRN Sundil, Subrina, MD  irbesartan  (AVAPRO ) tablet 150 mg  150 mg Oral Daily Abbe Abate, MD   150 mg at 10/12/23 1610   And   amLODipine  (NORVASC ) tablet 10 mg  10 mg Oral Daily Abbe Abate, MD   10 mg at 10/12/23 0859   aspirin  EC tablet 81 mg  81 mg Oral Daily Sundil, Subrina, MD   81 mg at 10/12/23 0859   atorvastatin  (LIPITOR) tablet 40 mg  40 mg Oral Daily Sundil, Subrina, MD    40 mg at 10/12/23 0859   carvedilol  (COREG ) tablet 12.5 mg  12.5 mg Oral BID WC Abbe Abate, MD       Chlorhexidine  Gluconate Cloth 2 % PADS 6 each  6 each Topical Q0600 Abbe Abate, MD   6 each at 10/12/23 0519   clopidogrel  (PLAVIX ) tablet 75 mg  75 mg Oral Daily Sundil, Subrina, MD   75 mg at 10/12/23 9604   insulin  aspart (novoLOG ) injection 0-5 Units  0-5 Units Subcutaneous QHS Sundil, Subrina, MD   2 Units at 10/11/23 2152   insulin  aspart (novoLOG ) injection 0-6 Units  0-6 Units Subcutaneous TID WC Sundil, Subrina, MD   2 Units at 10/11/23 1751   insulin  glargine-yfgn (SEMGLEE ) injection 12 Units  12 Units Subcutaneous Daily Sundil, Subrina, MD   12 Units at 10/12/23 0859   ondansetron  (ZOFRAN ) tablet 4 mg  4 mg Oral Q6H PRN Sundil, Subrina, MD       Or   ondansetron  (ZOFRAN ) injection 4 mg  4 mg Intravenous Q6H PRN Sundil, Subrina, MD       pantoprazole  (PROTONIX ) EC tablet 20 mg  20 mg Oral Daily Sundil, Subrina, MD   20 mg at 10/12/23 5409   tamsulosin  (FLOMAX ) capsule 0.4 mg  0.4 mg Oral Daily Sundil, Subrina, MD   0.4 mg at 10/12/23 8119     Discharge Medications: Please see discharge summary for a list of discharge medications.  Relevant Imaging Results:  Relevant Lab Results:   Additional Information SS#: [id number]  Tandy Fam, LCSW

## 2023-10-12 NOTE — TOC Transition Note (Signed)
 Transition of Care [facility]) - Discharge Note   Patient Details  Name: [name] MRN: [id number] Date of Birth: 28-Aug-1950  Transition of [facility]) CM/SW Contact:  Tandy Fam, LCSW Phone Number: 10/12/2023, 12:12 PM   Clinical Narrative:   CSW spoke with daughter, [name], to discuss return to SNF. CSW discussed patient desire to return home, and [name] indicated that is not an option. Patient cannot take care of himself at home alone, he is to be transitioned to LTC at [facility] when his insurance runs out. CSW submitted for new insurance approval and patient has been approved for rehab. CSW sent discharge information to [facility] and confirmed they are ready for patient to return. CSW notified [name] of discharge today. Transport arranged with [facility] for next available.  Nurse to call report to [phone], Room 126A.     Final next level of care: Skilled Nursing Facility Barriers to Discharge: Barriers Resolved   Patient Goals and CMS Choice Patient states their goals for this hospitalization and ongoing recovery are:: patient with cognitive deficits, not able to participate in goal setting CMS Medicare.gov Compare Post Acute Care list provided to:: Patient Represenative (must comment) Choice offered to / list presented to : Adult Children [place] ownership interest in [facility].provided to:: Adult Children    Discharge Placement              Patient chooses bed at:  [facility]) Patient to be transferred to facility by: [facility] Name of family member notified: [name] Patient and family notified of of transfer: 10/12/23  Discharge Plan and Services Additional resources added to the After Visit Summary for                                       Social Drivers of Health (SDOH) Interventions SDOH Screenings   Food Insecurity: No Food Insecurity (10/10/2023)  Housing: Unknown (10/10/2023)  Transportation Needs: No Transportation Needs  (10/10/2023)  Utilities: Not At Risk (10/10/2023)  Depression (PHQ2-9): Low Risk  (04/02/2023)  Social Connections: Unknown (10/10/2023)  Tobacco Use: Low Risk  (10/09/2023)     Readmission Risk Interventions     No data to display

## 2023-10-12 NOTE — Discharge Summary (Signed)
 DISCHARGE SUMMARY  [name]  MR#: [id number]  DOB:[birth year]  Date of Admission: 10/09/2023 Date of Discharge: 10/12/2023  Attending Physician:Ercole Georg Constantine Delude, MD  Patient's FAO:[id number], Jim Motts, MD  Disposition: D/C to SNF   Follow-up Appts:  Contact information for follow-up providers     Naida Austria, MD Follow up.   Specialty: Family Medicine Why: Arrange a follow-up appointment within 7-10 days of your discharge from the SNF rehab facility. Contact information: [address] [place] [address] [phone]              Contact information for after-discharge care     Destination     [facility] .   Service: Skilled Nursing Contact information: [address] [place]  [address] [phone]                     Tests Needing Follow-up: -Monitor BP with probable need to further titrate medical therapy for more strict control  Discharge Diagnoses: Acute versus subacute CVA left corona radiata Acute metabolic encephalopathy due to acute CVA CVA L ventral pons 2008 with chronic right-sided weakness dysarthria and dysphagia Lactic acidosis Mild hypotension HTN Hypomagnesemia Hypokalemia Hx of Prostate cancer DM2 PAD status post DES stent and angioplasty March 2025 Chronic diastolic CHF  Initial presentation: 73 year old with a history of prostate cancer, CVA 2008 with residual right-sided weakness dysphagia and aphasia, HTN, DM2, PAD status post SFA stenting & anterior tibial balloon angioplasty 08/19/2023, and diastolic CHF who was transferred from [facility] to the ER due to altered mental status described as generalized lethargy and drowsiness without loss of consciousness. In the ER his CBG was normal. Labs were unremarkable with exception to mildly decreased magnesium at 1.5. CT head revealed no acute abnormality. CXR was unrevealing.   Hospital Course:  Acute versus subacute CVA left corona  radiata -Resultant transient altered mental status/lethargy - pt has returned to his baseline at time of d/c -MRI brain noted small acute versus subacute infarct in the left corona radiata with background of extensive chronic small vessel disease  -CTa head and neck negative for LVO or other acute findings but did appreciate 60% atheromatous stenosis of the right carotid bulb with additional 75% stenosis at the distal cervical right ICA at the skull base and 45% atheromatous stenosis at the left carotid bulb with advanced atheromatous change about the carotid siphons bilaterally with moderate to severe stenosis as well as severe stenosis at the origins of both vertebral arteries and 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 -TTE  negative for PFO and thrombus w/ EF 70%  -medical treatment: already on aspirin  and Plavix  for PAD with DES stent - to continue indefinitely given dual indication with significant cerebral atherosclerosis -LDL 43 -A1c 8.0 -PT/OT/SLP suggest SNF rehab stay  -Workup complete with patient now medically ready for discharge   Acute metabolic encephalopathy due to acute CVA History reports significant improvement in patient's mental status after he was dosed with Narcan - CT head unrevealing - no clinical evidence to suggest acute infection - MRI brain 5/10 notes small acute versus subacute infarct in the left corona radiata with background of extensive chronic small vessel disease -mental status has returned to baseline at time of discharge   CVA L ventral pons 2008 with chronic right-sided weakness dysarthria and dysphagia Continue aspirin  Plavix  and Lipitor indefinitely   Lactic acidosis Likely due to simple volume depletion - resolved with volume expansion   Mild hypotension  Likely simply due to volume depletion - resolved with volume expansion   HTN Adjust medical therapy with patient now outside window of permissive hypertension -ongoing monitoring at SNF  will be indicated   Hypomagnesemia Supplementation has been provided -magnesium 1.9 at time of discharge   Hypokalemia Supplementation has been provided -potassium 4.3 at time of discharge   Hx of Prostate cancer Undergoing treatment at [facility] -follow-up as previously scheduled with his Urologist   DM2 CBG well-controlled as inpatient - A1c 8.0   PAD status post DES stent and angioplasty March 2025 Continue aspirin , Plavix , and Lipitor indefinitely   Chronic diastolic CHF Appears euvolemic/well compensated at present  Allergies as of 10/12/2023       Reactions   Beef-derived Drug Products    Unknown rxn per MAR.   Pork Allergy Other (See Comments)   Raises Blood pressure per report   Shrimp Flavor Agent (non-screening) Other (See Comments)   Doesn't like it        Medication List     STOP taking these medications    BASAGLAR  KWIKPEN Reedy   ciprofloxacin 750 MG tablet Commonly known as: CIPRO   doxycycline  100 MG tablet Commonly known as: ADOXA   ertapenem 1 g injection Commonly known as: INVANZ   Lantus  SoloStar 100 UNIT/ML Solostar Pen Generic drug: insulin  glargine Replaced by: insulin  glargine-yfgn 100 UNIT/ML injection   meloxicam 7.5 MG tablet Commonly known as: MOBIC   predniSONE  5 MG tablet Commonly known as: DELTASONE    promethazine 12.5 MG tablet Commonly known as: PHENERGAN   sertraline  100 MG tablet Commonly known as: ZOLOFT    tiZANidine  2 MG tablet Commonly known as: ZANAFLEX        TAKE these medications    abiraterone  acetate 250 MG tablet Commonly known as: ZYTIGA  Take 1,000 mg by mouth daily.   acetaminophen  325 MG tablet Commonly known as: TYLENOL  Take 2 tablets (650 mg total) by mouth every 6 (six) hours as needed for mild pain (pain score 1-3) (or Fever >/= 101). What changed:  medication strength how much to take when to take this reasons to take this   amlodipine -olmesartan  10-20 MG tablet Commonly known  as: AZOR  TAKE 1 TABLET BY MOUTH EVERY DAY   aspirin  EC 81 MG tablet Take 1 tablet (81 mg total) by mouth daily. Swallow whole.   atorvastatin  40 MG tablet Commonly known as: LIPITOR Take 1 tablet (40 mg total) by mouth daily.   carvedilol  12.5 MG tablet Commonly known as: COREG  Take 1 tablet (12.5 mg total) by mouth 2 (two) times daily with a meal.   clopidogrel  75 MG tablet Commonly known as: PLAVIX  Take 1 tablet (75 mg total) by mouth daily.   insulin  aspart 100 UNIT/ML injection Commonly known as: novoLOG  Inject 0-5 Units into the skin at bedtime.   insulin  aspart 100 UNIT/ML injection Commonly known as: novoLOG  Inject 0-6 Units into the skin 3 (three) times daily with meals.   insulin  glargine-yfgn 100 UNIT/ML injection Commonly known as: SEMGLEE  Inject 0.12 mLs (12 Units total) into the skin daily. Start taking on: Oct 13, 2023 Replaces: Lantus  SoloStar 100 UNIT/ML Solostar Pen   Jardiance  25 MG Tabs tablet Generic drug: empagliflozin  Take 25 mg by mouth daily.   metFORMIN  1000 MG tablet Commonly known as: GLUCOPHAGE  Take 1 tablet (1,000 mg total) by mouth 2 (two) times daily with a meal.   pantoprazole  20 MG tablet Commonly known as: PROTONIX  TAKE 1 TABLET BY MOUTH EVERY DAY  tamsulosin  0.4 MG Caps capsule Commonly known as: FLOMAX  Take 0.4 mg by mouth daily.        Day of Discharge BP (!) 157/63 (BP Location: Left Arm)   Pulse 66   Temp 98.5 F (36.9 C) (Oral)   Resp 18   SpO2 97%   Physical Exam: General: No acute respiratory distress Lungs: Clear to auscultation bilaterally without wheezes or crackles Cardiovascular: Regular rate and rhythm without murmur gallop or rub normal S1 and S2 Abdomen: Nontender, nondistended, soft, bowel sounds positive, no rebound, no ascites, no appreciable mass Extremities: No significant cyanosis, clubbing, or edema bilateral lower extremities  Basic Metabolic Panel: Recent Labs  Lab 10/09/23 1615  10/10/23 0751 10/11/23 0718 10/12/23 0700  NA 139 142 140 139  K 4.3 5.0 3.4* 4.3  CL 106 106 105 107  CO2 24 24 27 24   GLUCOSE 123* 104* 99 133*  BUN 25* 17 13 25*  CREATININE 1.10 0.93 0.82 0.86  CALCIUM  8.8* 9.2 8.5* 8.5*  MG 1.5*  --  1.5* 1.9    CBC: Recent Labs  Lab 10/09/23 1615 10/10/23 0751 10/11/23 0718  WBC 10.5 13.9* 11.1*  NEUTROABS 8.2*  --   --   HGB 11.0* 11.5* 11.3*  HCT 36.4* 35.8* 34.7*  MCV 96.8 92.0 91.6  PLT 186 175 173     Time spent in discharge (includes decision making & examination of pt): 35 minutes  10/12/2023, 10:48 AM   Abbe Abate, MD [facility] Office  [phone]

## 2023-10-12 NOTE — Progress Notes (Signed)
 Report called to nurse at [facility] to provide transportation

## 2023-10-13 DIAGNOSIS — I639 Cerebral infarction, unspecified: Secondary | ICD-10-CM | POA: Diagnosis not present

## 2023-10-13 DIAGNOSIS — C61 Malignant neoplasm of prostate: Secondary | ICD-10-CM | POA: Diagnosis not present

## 2023-10-13 DIAGNOSIS — E785 Hyperlipidemia, unspecified: Secondary | ICD-10-CM | POA: Diagnosis not present

## 2023-10-13 DIAGNOSIS — R531 Weakness: Secondary | ICD-10-CM | POA: Diagnosis not present

## 2023-10-14 DIAGNOSIS — R278 Other lack of coordination: Secondary | ICD-10-CM | POA: Diagnosis not present

## 2023-10-14 DIAGNOSIS — I69320 Aphasia following cerebral infarction: Secondary | ICD-10-CM | POA: Diagnosis not present

## 2023-10-14 DIAGNOSIS — W19XXXA Unspecified fall, initial encounter: Secondary | ICD-10-CM | POA: Diagnosis not present

## 2023-10-14 DIAGNOSIS — G8191 Hemiplegia, unspecified affecting right dominant side: Secondary | ICD-10-CM | POA: Diagnosis not present

## 2023-10-14 DIAGNOSIS — I739 Peripheral vascular disease, unspecified: Secondary | ICD-10-CM | POA: Diagnosis not present

## 2023-10-14 DIAGNOSIS — E785 Hyperlipidemia, unspecified: Secondary | ICD-10-CM | POA: Diagnosis not present

## 2023-10-14 DIAGNOSIS — R531 Weakness: Secondary | ICD-10-CM | POA: Diagnosis not present

## 2023-10-14 DIAGNOSIS — E119 Type 2 diabetes mellitus without complications: Secondary | ICD-10-CM | POA: Diagnosis not present

## 2023-10-14 DIAGNOSIS — L97812 Non-pressure chronic ulcer of other part of right lower leg with fat layer exposed: Secondary | ICD-10-CM | POA: Diagnosis not present

## 2023-10-14 DIAGNOSIS — I639 Cerebral infarction, unspecified: Secondary | ICD-10-CM | POA: Diagnosis not present

## 2023-10-14 DIAGNOSIS — M6281 Muscle weakness (generalized): Secondary | ICD-10-CM | POA: Diagnosis not present

## 2023-10-14 DIAGNOSIS — C61 Malignant neoplasm of prostate: Secondary | ICD-10-CM | POA: Diagnosis not present

## 2023-10-14 LAB — CULTURE, BLOOD (ROUTINE X 2)
Culture: NO GROWTH
Culture: NO GROWTH

## 2023-10-17 DIAGNOSIS — F339 Major depressive disorder, recurrent, unspecified: Secondary | ICD-10-CM | POA: Diagnosis not present

## 2023-10-19 ENCOUNTER — Other Ambulatory Visit: Payer: Self-pay | Admitting: Family Medicine

## 2023-10-19 DIAGNOSIS — R1110 Vomiting, unspecified: Secondary | ICD-10-CM

## 2023-10-19 DIAGNOSIS — R338 Other retention of urine: Secondary | ICD-10-CM | POA: Diagnosis not present

## 2023-10-20 DIAGNOSIS — I69320 Aphasia following cerebral infarction: Secondary | ICD-10-CM | POA: Diagnosis not present

## 2023-10-20 DIAGNOSIS — F32A Depression, unspecified: Secondary | ICD-10-CM | POA: Diagnosis not present

## 2023-10-20 DIAGNOSIS — C61 Malignant neoplasm of prostate: Secondary | ICD-10-CM | POA: Diagnosis not present

## 2023-10-20 DIAGNOSIS — M25461 Effusion, right knee: Secondary | ICD-10-CM | POA: Diagnosis not present

## 2023-10-20 DIAGNOSIS — K219 Gastro-esophageal reflux disease without esophagitis: Secondary | ICD-10-CM | POA: Diagnosis not present

## 2023-10-20 DIAGNOSIS — E118 Type 2 diabetes mellitus with unspecified complications: Secondary | ICD-10-CM | POA: Diagnosis not present

## 2023-10-20 DIAGNOSIS — I739 Peripheral vascular disease, unspecified: Secondary | ICD-10-CM | POA: Diagnosis not present

## 2023-10-20 DIAGNOSIS — N139 Obstructive and reflux uropathy, unspecified: Secondary | ICD-10-CM | POA: Diagnosis not present

## 2023-10-20 DIAGNOSIS — G8191 Hemiplegia, unspecified affecting right dominant side: Secondary | ICD-10-CM | POA: Diagnosis not present

## 2023-10-20 DIAGNOSIS — M6281 Muscle weakness (generalized): Secondary | ICD-10-CM | POA: Diagnosis not present

## 2023-10-20 DIAGNOSIS — I6930 Unspecified sequelae of cerebral infarction: Secondary | ICD-10-CM | POA: Diagnosis not present

## 2023-10-20 DIAGNOSIS — R1311 Dysphagia, oral phase: Secondary | ICD-10-CM | POA: Diagnosis not present

## 2023-10-21 DIAGNOSIS — L97812 Non-pressure chronic ulcer of other part of right lower leg with fat layer exposed: Secondary | ICD-10-CM | POA: Diagnosis not present

## 2023-10-21 DIAGNOSIS — M4802 Spinal stenosis, cervical region: Secondary | ICD-10-CM | POA: Diagnosis not present

## 2023-10-21 DIAGNOSIS — M6281 Muscle weakness (generalized): Secondary | ICD-10-CM | POA: Diagnosis not present

## 2023-10-21 DIAGNOSIS — E119 Type 2 diabetes mellitus without complications: Secondary | ICD-10-CM | POA: Diagnosis not present

## 2023-10-21 DIAGNOSIS — R278 Other lack of coordination: Secondary | ICD-10-CM | POA: Diagnosis not present

## 2023-10-21 DIAGNOSIS — I739 Peripheral vascular disease, unspecified: Secondary | ICD-10-CM | POA: Diagnosis not present

## 2023-10-21 DIAGNOSIS — G8191 Hemiplegia, unspecified affecting right dominant side: Secondary | ICD-10-CM | POA: Diagnosis not present

## 2023-10-21 DIAGNOSIS — I69320 Aphasia following cerebral infarction: Secondary | ICD-10-CM | POA: Diagnosis not present

## 2023-10-26 ENCOUNTER — Other Ambulatory Visit: Payer: Self-pay | Admitting: Family Medicine

## 2023-10-26 DIAGNOSIS — I10 Essential (primary) hypertension: Secondary | ICD-10-CM

## 2023-10-28 DIAGNOSIS — R278 Other lack of coordination: Secondary | ICD-10-CM | POA: Diagnosis not present

## 2023-10-28 DIAGNOSIS — E119 Type 2 diabetes mellitus without complications: Secondary | ICD-10-CM | POA: Diagnosis not present

## 2023-10-28 DIAGNOSIS — I69320 Aphasia following cerebral infarction: Secondary | ICD-10-CM | POA: Diagnosis not present

## 2023-10-28 DIAGNOSIS — I739 Peripheral vascular disease, unspecified: Secondary | ICD-10-CM | POA: Diagnosis not present

## 2023-10-28 DIAGNOSIS — G8191 Hemiplegia, unspecified affecting right dominant side: Secondary | ICD-10-CM | POA: Diagnosis not present

## 2023-10-28 DIAGNOSIS — L97812 Non-pressure chronic ulcer of other part of right lower leg with fat layer exposed: Secondary | ICD-10-CM | POA: Diagnosis not present

## 2023-10-28 DIAGNOSIS — M6281 Muscle weakness (generalized): Secondary | ICD-10-CM | POA: Diagnosis not present

## 2023-10-30 ENCOUNTER — Other Ambulatory Visit: Payer: Self-pay | Admitting: Family Medicine

## 2023-10-30 DIAGNOSIS — F32A Depression, unspecified: Secondary | ICD-10-CM

## 2023-11-03 DIAGNOSIS — I69320 Aphasia following cerebral infarction: Secondary | ICD-10-CM | POA: Diagnosis not present

## 2023-11-03 DIAGNOSIS — G8191 Hemiplegia, unspecified affecting right dominant side: Secondary | ICD-10-CM | POA: Diagnosis not present

## 2023-11-03 DIAGNOSIS — M6281 Muscle weakness (generalized): Secondary | ICD-10-CM | POA: Diagnosis not present

## 2023-11-03 DIAGNOSIS — I6930 Unspecified sequelae of cerebral infarction: Secondary | ICD-10-CM | POA: Diagnosis not present

## 2023-11-03 DIAGNOSIS — M25461 Effusion, right knee: Secondary | ICD-10-CM | POA: Diagnosis not present

## 2023-11-03 DIAGNOSIS — E118 Type 2 diabetes mellitus with unspecified complications: Secondary | ICD-10-CM | POA: Diagnosis not present

## 2023-11-03 DIAGNOSIS — N139 Obstructive and reflux uropathy, unspecified: Secondary | ICD-10-CM | POA: Diagnosis not present

## 2023-11-03 DIAGNOSIS — C61 Malignant neoplasm of prostate: Secondary | ICD-10-CM | POA: Diagnosis not present

## 2023-11-03 DIAGNOSIS — I739 Peripheral vascular disease, unspecified: Secondary | ICD-10-CM | POA: Diagnosis not present

## 2023-11-03 DIAGNOSIS — R1311 Dysphagia, oral phase: Secondary | ICD-10-CM | POA: Diagnosis not present

## 2023-11-03 DIAGNOSIS — F32A Depression, unspecified: Secondary | ICD-10-CM | POA: Diagnosis not present

## 2023-11-03 DIAGNOSIS — K219 Gastro-esophageal reflux disease without esophagitis: Secondary | ICD-10-CM | POA: Diagnosis not present

## 2023-11-04 DIAGNOSIS — C7951 Secondary malignant neoplasm of bone: Secondary | ICD-10-CM | POA: Diagnosis not present

## 2023-11-04 DIAGNOSIS — C61 Malignant neoplasm of prostate: Secondary | ICD-10-CM | POA: Diagnosis not present

## 2023-11-17 DIAGNOSIS — N139 Obstructive and reflux uropathy, unspecified: Secondary | ICD-10-CM | POA: Diagnosis not present

## 2023-11-17 DIAGNOSIS — I10 Essential (primary) hypertension: Secondary | ICD-10-CM | POA: Diagnosis not present

## 2023-11-17 DIAGNOSIS — G8191 Hemiplegia, unspecified affecting right dominant side: Secondary | ICD-10-CM | POA: Diagnosis not present

## 2023-11-17 DIAGNOSIS — I639 Cerebral infarction, unspecified: Secondary | ICD-10-CM | POA: Diagnosis not present

## 2023-11-17 DIAGNOSIS — F32A Depression, unspecified: Secondary | ICD-10-CM | POA: Diagnosis not present

## 2023-11-17 DIAGNOSIS — K219 Gastro-esophageal reflux disease without esophagitis: Secondary | ICD-10-CM | POA: Diagnosis not present

## 2023-11-18 DIAGNOSIS — F339 Major depressive disorder, recurrent, unspecified: Secondary | ICD-10-CM | POA: Diagnosis not present

## 2023-11-18 NOTE — Progress Notes (Signed)
 [facility] CONSULT NOTE  Patient Care Team: Adele Song, MD as PCP - General Verlin Lonni BIRCH, MD as PCP - Cardiology (Cardiology) Verlin Lonni BIRCH, MD (Cardiology) Vertell Pont, RN as Oncology Nurse Navigator  ASSESSMENT & PLAN:  [name] is a 73 y.o.male with history of CVAs with chronic right-sided weakness dysarthria and dysphagia, DM, HTN, PAD, and prostate cancer being seen at Medical Oncology Clinic for prostate cancer.  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 GS 4+5 GG5 in 4 cores. PSA 150. PET with multifocal skeletal metastasis involving the pelvis, spine and LEFT ribs and a large RIGHT common iliac node. He was started on ADT in 01/2022 and AAP in 04/2022. PSA nadir in 10/2022 of 0.36 followed by rising PSA.   Repeat PET showed persistent radiotracer activity the posterior RIGHT acetabulum with associated bone sclerosis. Other bone lesions have resolved in the interval.  At baseline he has right sided weakness. He is currently residing in nursing home requires assistance hoping to be discharged home.  Current diagnosis: mCRPC Initial diagnosis: mHSPC Germline testing: will be drawn today  Somatic testing: will be drawn today  Treatment: ADT/AAP 02/28/22 ADT 04/2022 AAP 10/2022 PSA nadir 0.36. followed by rising PSA 04/2023 PSA 1.38 10/2023 PSA 3.2 T<10 11/04/23 PSA 10.7 T<10  New PET showed Persistent radiotracer activity the posterior RIGHT acetabulum with associated bone sclerosis. Other bone lesions have resolved in the interval.  The patient was counseled on the natural history of prostate cancer and the standard treatment options that are available for prostate cancer. Treatment will be palliative and he has now mCRPC.   Report of multiple CVAs. Also had DM, HTN, PAD. Discussed he is higher risk given his medical history with prostate cancer treatment for MI, CVA, heart failure. Recommend palliative care  consult for GOC and ACP given his significant CVA history as well. He is very concerned of his diet from nursing home and it's relation to his medications. Also with his age and progression, will change AAP to darolutamide. He has residual disease/oligoprogression. Will refer for evaluation for radiation to the posterior RIGHT acetabulum.   Will connect with NN Vertell today. Assessment & Plan Metastatic castration-resistant adenocarcinoma of prostate [facility]) Recommend evaluation for radiation to residual disease. Referral to radiation oncology Genetic testing and somatic testing Change to darolutamide. Once obtained darolutamide, may stop abiraterone  and prednisone . Referral to palliative care for ACP. Normocytic anemia B12, MMA, Folate, Ferritin Cerebrovascular accident (CVA), unspecified mechanism (HCC) Baseline right side weakness. Continue anti-platelet and optimize CV risk factors. Referral to nutrition  Supportive Will discuss bone mineral density study next visit calcium  (1000-1200 mg daily from food and supplements) and vitamin D3 (1000 IU daily) Will discuss Zometa on ADT after dental clearance next visit. Healthy lifestyle to prevent diabetes and CV disease Aggressive cardiovascular risk management Limit alcohol consumption and avoid smoking  Orders Placed This Encounter  Procedures   CBC with Differential (Cancer Center Only)    Standing Status:   Future    Number of Occurrences:   1    Expiration Date:   11/22/2024   CMP (Cancer Center only)    Standing Status:   Future    Number of Occurrences:   1    Expiration Date:   11/22/2024   Testosterone     Standing Status:   Future    Number of Occurrences:   1    Expiration Date:   11/22/2024   Prostate-Specific  AG, Serum    Standing Status:   Future    Number of Occurrences:   1    Expiration Date:   11/22/2024   Ferritin    Standing Status:   Future    Number of Occurrences:   1    Expiration Date:   11/22/2024   Folate     Standing Status:   Future    Number of Occurrences:   1    Expiration Date:   11/22/2024   Vitamin B12    Standing Status:   Future    Number of Occurrences:   1    Expiration Date:   11/22/2024   Methylmalonic acid, serum    Standing Status:   Future    Number of Occurrences:   1    Expiration Date:   12/23/2023   Amb Referral to Palliative Care    Referral Priority:   Routine    Referral Type:   Consultation    Number of Visits Requested:   1    The total time spent in the appointment was 60 minutes encounter with patients including review of chart and various tests results, discussions about plan of care and coordination of care plan with patient, caregiver and other team members.  All questions were answered. The patient knows to call the clinic with any problems, questions or concerns. No barriers to learning was detected.  [name], MD 6/23/20253:13 PM  CHIEF COMPLAINTS/PURPOSE OF CONSULTATION:  Prostate cancer  HISTORY OF PRESENTING ILLNESS:  [name] 73 y.o. male is here because of prostate cancer. I have reviewed his chart and materials related to his cancer extensively and collaborated history with the patient. Summary of oncologic history is as follows: Oncology History  Prostate cancer (HCC)  11/2021 Initial Diagnosis   Prostate cancer (HCC) De novo with bone, LN metastases. PSA 150. GG5 4+5 in 4 cores. Rest were 4+3 lesions   02/26/2022 PET scan   PSMA PET 1. Intense activity throughout the prostate gland consistent with primary prostate adenocarcinoma. Prostate gland enlarged. Foley catheter in place. 2. Suspicion of local prostate cancer extension to the LEFT seminal vesicle. 3. Multifocal radiotracer avid skeletal metastasis involving the pelvis, spine and LEFT ribs. There are seven lesions identified. 4. Large RIGHT common iliac node with mild radiotracer activity is favored necrotic nodal metastasis. 5. Incidental finding of a RIGHT  inguinal hernia which contains nonobstructed loop of small bowel.   10/09/2023 PET scan   PSMA PET 1. Persistent radiotracer activity the posterior RIGHT acetabulum with associated bone sclerosis. Other bone lesions have resolved in the interval. 2. Interval resolution of metastatic adenopathy in the pelvis. 3. No new adenopathy or visceral metastasis identified 4. Foley catheter within collapsed bladder.     MEDICAL HISTORY:  Past Medical History:  Diagnosis Date   CVA (cerebral infarction) 01/03/2008   MRI: Acute 1 x 1.5 cm infarction affecting the left side of the pons.   Diabetes mellitus    DVT (deep venous thrombosis) (HCC)    Hypertension    PAD (peripheral artery disease) (HCC)    Stroke (HCC)    2008    SURGICAL HISTORY: Past Surgical History:  Procedure Laterality Date   ABDOMINAL AORTOGRAM W/LOWER EXTREMITY Right 08/19/2023   Procedure: ABDOMINAL AORTOGRAM W/LOWER EXTREMITY;  Surgeon: Lanis Fonda BRAVO, MD;  Location: [facility] INVASIVE CV LAB;  Service: Cardiovascular;  Laterality: Right;   COLONOSCOPY     LOWER EXTREMITY ANGIOGRAPHY Right 08/19/2023   Procedure:  Lower Extremity Angiography;  Surgeon: Lanis Fonda BRAVO, MD;  Location: [facility] INVASIVE CV LAB;  Service: Cardiovascular;  Laterality: Right;   LOWER EXTREMITY INTERVENTION Right 08/19/2023   Procedure: LOWER EXTREMITY INTERVENTION;  Surgeon: Lanis Fonda BRAVO, MD;  Location: [facility] INVASIVE CV LAB;  Service: Cardiovascular;  Laterality: Right;   None      SOCIAL HISTORY: Social History   Socioeconomic History   Marital status: Divorced    Spouse name: Not on file   Number of children: 3   Years of education: 14   Highest education level: Not on file  Occupational History   Occupation: Disabled-security guard    Employer: [employer]  Tobacco Use   Smoking status: Never   Smokeless tobacco: Never  Substance and Sexual Activity   Alcohol use: No   Drug use: No   Sexual activity: Never    Partners: Female   Other Topics Concern   Not on file  Social History Narrative   Divorced, lives alone   2 sons, 1 daughter   Retired/disabled   No caffeine   12/24/2015   Social Drivers of Health   Financial Resource Strain: Not on file  Food Insecurity: No Food Insecurity (11/23/2023)   Hunger Vital Sign    Worried About Running Out of Food in the Last Year: Never true    Ran Out of Food in the Last Year: Never true  Transportation Needs: No Transportation Needs (11/23/2023)   PRAPARE - Administrator, Civil Service (Medical): No    Lack of Transportation (Non-Medical): No  Physical Activity: Not on file  Stress: Not on file  Social Connections: Unknown (10/10/2023)   Social Connection and Isolation Panel    Frequency of Communication with Friends and Family: More than three times a week    Frequency of Social Gatherings with Friends and Family: Three times a week    Attends Religious Services: Not on file    Active Member of Clubs or Organizations: Not on file    Attends Club or Organization Meetings: Not on file    Marital Status: Not on file  Intimate Partner Violence: Not At Risk (11/23/2023)   Humiliation, Afraid, Rape, and Kick questionnaire    Fear of Current or Ex-Partner: No    Emotionally Abused: No    Physically Abused: No    Sexually Abused: No    FAMILY HISTORY: Family History  Problem Relation Age of Onset   Diabetes Mother    Heart attack Mother    Hypertension Mother    Diabetes Sister    Hypertension Sister    Stroke Sister        2017   Hypertension Brother    Hypertension Daughter    Hypertension Son    Colon cancer Neg Hx    Esophageal cancer Neg Hx    Stomach cancer Neg Hx    Rectal cancer Neg Hx     ALLERGIES:  is allergic to beef-derived drug products, pork allergy, and shrimp flavor agent (non-screening).  MEDICATIONS:  Current Outpatient Medications  Medication Sig Dispense Refill   abiraterone  acetate (ZYTIGA ) 250 MG tablet Take 1,000 mg  by mouth daily.     acetaminophen  (TYLENOL ) 325 MG tablet Take 2 tablets (650 mg total) by mouth every 6 (six) hours as needed for mild pain (pain score 1-3) (or Fever >/= 101).     ADMELOG SOLOSTAR 100 UNIT/ML KwikPen Inject into the skin.     amlodipine -olmesartan  (AZOR ) 10-20 MG  tablet Take 1 tablet by mouth daily. PLEASE SCHEDULE APPT PRIOR TO NEXT REFILL 90 tablet 0   aspirin  EC 81 MG tablet Take 1 tablet (81 mg total) by mouth daily. Swallow whole. 30 tablet 12   atorvastatin  (LIPITOR) 40 MG tablet Take 1 tablet (40 mg total) by mouth daily. 90 tablet 3   carvedilol  (COREG ) 12.5 MG tablet Take 1 tablet (12.5 mg total) by mouth 2 (two) times daily with a meal. 180 tablet 3   clopidogrel  (PLAVIX ) 75 MG tablet Take 1 tablet (75 mg total) by mouth daily. 90 tablet 2   insulin  aspart (NOVOLOG ) 100 UNIT/ML injection Inject 0-5 Units into the skin at bedtime.     insulin  aspart (NOVOLOG ) 100 UNIT/ML injection Inject 0-6 Units into the skin 3 (three) times daily with meals.     JARDIANCE  25 MG TABS tablet Take 25 mg by mouth daily.     metFORMIN  (GLUCOPHAGE ) 1000 MG tablet Take 1 tablet (1,000 mg total) by mouth 2 (two) times daily with a meal. 180 tablet 3   pantoprazole  (PROTONIX ) 20 MG tablet TAKE 1 TABLET BY MOUTH EVERY DAY 90 tablet 1   predniSONE  (DELTASONE ) 5 MG tablet Take 5 mg by mouth daily.     sertraline  (ZOLOFT ) 100 MG tablet TAKE 1 TABLET BY MOUTH EVERY DAY 90 tablet 2   insulin  glargine-yfgn (SEMGLEE ) 100 UNIT/ML injection Inject 0.12 mLs (12 Units total) into the skin daily.     tamsulosin  (FLOMAX ) 0.4 MG CAPS capsule Take 0.4 mg by mouth daily.     No current facility-administered medications for this visit.    REVIEW OF SYSTEMS:   All relevant systems were reviewed with the patient and are negative.  PHYSICAL EXAMINATION: ECOG PERFORMANCE STATUS: 2 - Symptomatic, <50% confined to bed  Vitals:   11/23/23 1015  BP: (!) 150/54  Pulse: (!) 57  Resp: 18  Temp: 98.9 F (37.2  C)  SpO2: 100%   Filed Weights   11/23/23 1015  Weight: 204 lb (92.5 kg)    GENERAL: alert, no distress and comfortable, on wheelchair SKIN: skin color is normal EYES: sclera clear LUNGS: Effort normal, no respiratory distress.  Clear to auscultation bilaterally HEART: regular rate & rhythm and no lower extremity edema ABDOMEN: soft, non-tender and nondistended  LABORATORY DATA:  I have reviewed the results of PSA.  RADIOGRAPHIC STUDIES: I have personally reviewed the radiological images as listed and agreed with the findings in the report.

## 2023-11-23 ENCOUNTER — Telehealth: Payer: Self-pay

## 2023-11-23 ENCOUNTER — Other Ambulatory Visit: Payer: Self-pay | Admitting: Genetic Counselor

## 2023-11-23 ENCOUNTER — Other Ambulatory Visit (HOSPITAL_COMMUNITY): Payer: Self-pay

## 2023-11-23 ENCOUNTER — Inpatient Hospital Stay

## 2023-11-23 VITALS — BP 150/54 | HR 57 | Temp 98.9°F | Resp 18 | Ht 67.0 in | Wt 204.0 lb

## 2023-11-23 DIAGNOSIS — C61 Malignant neoplasm of prostate: Secondary | ICD-10-CM | POA: Diagnosis not present

## 2023-11-23 DIAGNOSIS — C7951 Secondary malignant neoplasm of bone: Secondary | ICD-10-CM | POA: Diagnosis not present

## 2023-11-23 DIAGNOSIS — Z8673 Personal history of transient ischemic attack (TIA), and cerebral infarction without residual deficits: Secondary | ICD-10-CM | POA: Insufficient documentation

## 2023-11-23 DIAGNOSIS — D649 Anemia, unspecified: Secondary | ICD-10-CM | POA: Diagnosis not present

## 2023-11-23 DIAGNOSIS — I639 Cerebral infarction, unspecified: Secondary | ICD-10-CM

## 2023-11-23 DIAGNOSIS — Z8546 Personal history of malignant neoplasm of prostate: Secondary | ICD-10-CM | POA: Diagnosis not present

## 2023-11-23 DIAGNOSIS — Z1379 Encounter for other screening for genetic and chromosomal anomalies: Secondary | ICD-10-CM

## 2023-11-23 DIAGNOSIS — Z192 Hormone resistant malignancy status: Secondary | ICD-10-CM

## 2023-11-23 LAB — CBC WITH DIFFERENTIAL (CANCER CENTER ONLY)
Abs Immature Granulocytes: 0.03 10*3/uL (ref 0.00–0.07)
Basophils Absolute: 0.1 10*3/uL (ref 0.0–0.1)
Basophils Relative: 1 %
Eosinophils Absolute: 0.3 10*3/uL (ref 0.0–0.5)
Eosinophils Relative: 3 %
HCT: 33.6 % — ABNORMAL LOW (ref 39.0–52.0)
Hemoglobin: 10.8 g/dL — ABNORMAL LOW (ref 13.0–17.0)
Immature Granulocytes: 0 %
Lymphocytes Relative: 22 %
Lymphs Abs: 2.4 10*3/uL (ref 0.7–4.0)
MCH: 28.5 pg (ref 26.0–34.0)
MCHC: 32.1 g/dL (ref 30.0–36.0)
MCV: 88.7 fL (ref 80.0–100.0)
Monocytes Absolute: 0.8 10*3/uL (ref 0.1–1.0)
Monocytes Relative: 7 %
Neutro Abs: 7 10*3/uL (ref 1.7–7.7)
Neutrophils Relative %: 67 %
Platelet Count: 203 10*3/uL (ref 150–400)
RBC: 3.79 MIL/uL — ABNORMAL LOW (ref 4.22–5.81)
RDW: 12.6 % (ref 11.5–15.5)
WBC Count: 10.5 10*3/uL (ref 4.0–10.5)
nRBC: 0 % (ref 0.0–0.2)

## 2023-11-23 LAB — CMP (CANCER CENTER ONLY)
ALT: 12 U/L (ref 0–44)
AST: 15 U/L (ref 15–41)
Albumin: 3.8 g/dL (ref 3.5–5.0)
Alkaline Phosphatase: 78 U/L (ref 38–126)
Anion gap: 8 (ref 5–15)
BUN: 22 mg/dL (ref 8–23)
CO2: 26 mmol/L (ref 22–32)
Calcium: 9.1 mg/dL (ref 8.9–10.3)
Chloride: 105 mmol/L (ref 98–111)
Creatinine: 0.95 mg/dL (ref 0.61–1.24)
GFR, Estimated: >60 mL/min (ref >60)
Glucose, Bld: 164 mg/dL — ABNORMAL HIGH (ref 70–99)
Potassium: 3.9 mmol/L (ref 3.5–5.1)
Sodium: 139 mmol/L (ref 135–145)
Total Bilirubin: 0.5 mg/dL (ref 0.0–1.2)
Total Protein: 7.1 g/dL (ref 6.5–8.1)

## 2023-11-23 LAB — MISCELLANEOUS TEST

## 2023-11-23 LAB — GENETIC SCREENING ORDER

## 2023-11-23 LAB — FERRITIN: Ferritin: 69 ng/mL (ref 24–336)

## 2023-11-23 LAB — FOLATE: Folate: 13.7 ng/mL (ref >5.9)

## 2023-11-23 LAB — VITAMIN B12: Vitamin B-12: 324 pg/mL (ref 180–914)

## 2023-11-23 MED ORDER — DAROLUTAMIDE 300 MG PO TABS: 600.0000 mg | ORAL_TABLET | Freq: Two times a day (BID) | ORAL | 11 refills | Status: DC

## 2023-11-23 NOTE — Telephone Encounter (Signed)
 Oral Oncology Patient Advocate Encounter   Received notification that prior authorization for Nubeqa is required.   PA started on 11/23/23 Key [id number] Status is pending      Charlott Hamilton,  CPhT-Adv  she/her/hers [facility]  [facility] Specialty Pharmacy Services Pharmacy Technician Patient Advocate Specialist III WL [email or web address]  Fax: [phone]

## 2023-11-23 NOTE — Assessment & Plan Note (Signed)
 Recommend evaluation for radiation to residual disease. Referral to radiation oncology Genetic testing and somatic testing Change to darolutamide. Once obtained darolutamide, may stop abiraterone  and prednisone . Referral to palliative care for ACP.

## 2023-11-23 NOTE — Assessment & Plan Note (Addendum)
 Baseline right side weakness. Continue anti-platelet and optimize CV risk factors. Referral to nutrition

## 2023-11-23 NOTE — Progress Notes (Addendum)
 PATIENT NAVIGATOR PROGRESS NOTE  Name: [name] Date: 11/23/2023 MRN: [id number]  DOB: 1950/09/15   Reason for visit:  Everitt Novo Metastatic PCa  Recommendations: Genetic testing obtained today due patient presenting as mCRPC. Recommend by MD to  transition therapy to Nubeqa. Continue ADT under urology.  Proceed with a radiation oncology consultation to discuss treatment options for the posterior right acetabulum. Referrals to social work and a dietitian have also been placed to support comprehensive care.

## 2023-11-24 ENCOUNTER — Telehealth: Payer: Self-pay

## 2023-11-24 ENCOUNTER — Telehealth: Payer: Self-pay | Admitting: Radiation Oncology

## 2023-11-24 ENCOUNTER — Ambulatory Visit: Payer: Self-pay

## 2023-11-24 ENCOUNTER — Other Ambulatory Visit (HOSPITAL_COMMUNITY): Payer: Self-pay

## 2023-11-24 DIAGNOSIS — C61 Malignant neoplasm of prostate: Secondary | ICD-10-CM | POA: Diagnosis not present

## 2023-11-24 DIAGNOSIS — R2689 Other abnormalities of gait and mobility: Secondary | ICD-10-CM | POA: Diagnosis not present

## 2023-11-24 DIAGNOSIS — M6281 Muscle weakness (generalized): Secondary | ICD-10-CM | POA: Diagnosis not present

## 2023-11-24 LAB — TESTOSTERONE: Testosterone: <3 ng/dL — ABNORMAL LOW (ref 264–916)

## 2023-11-24 LAB — PROSTATE-SPECIFIC AG, SERUM (LABCORP): Prostate Specific Ag, Serum: 10.5 ng/mL — ABNORMAL HIGH (ref 0.0–4.0)

## 2023-11-24 NOTE — Telephone Encounter (Signed)
 Oral Oncology Patient Advocate Encounter  I submitted an appeal for Nubeqa via E- Appeal in CoverMyMeds. I will follow until determination is made.   Appeal's P# [id number] F# [phone]    Charlott Hamilton,  CPhT-Adv  she/her/hers [facility]  [facility] Specialty Pharmacy Services Pharmacy Technician Patient Advocate Specialist III WL [email or web address]  Fax: [phone]

## 2023-11-24 NOTE — Telephone Encounter (Signed)
 Oral Oncology Pharmacist Encounter  Received new prescription for Nubeqa (darolutamide) for the treatment of metastatic castration resistance prostate cancer in conjunction with ADT, planned duration until disease progression or unacceptable toxicity.  Labs from 11/23/23 (CBC and CMP) assessed, no interventions needed. Prescription dose and frequency assessed for appropriateness.  Current medication list in Epic reviewed, DDIs with Nubeqa identified: - atorvastatin : nubeqa may decrease the concentration of OATP1B1/1B3 substrates. Patient will be notified that atorvastatin  may not work as well. No modifications needed.  Evaluated chart and no patient barriers to medication adherence noted.   Prescription has been e-scribed to the [facility] for benefits analysis and approval.  Oral Oncology Clinic will continue to follow for insurance authorization, copayment issues, initial counseling and start date.  Jeno Calleros, PharmD Hematology/Oncology Clinical Pharmacist [facility] Oral Chemotherapy Navigation Clinic [phone] 11/24/2023 9:03 AM

## 2023-11-24 NOTE — Telephone Encounter (Signed)
 Oncology Pharmacist Encounter  An urgent appeal has been written and signed by MD due to denial. Appeal will be submitted to patients insurance and will follow up on decision.   Deanza Upperman, PharmD Hematology/Oncology Clinical Pharmacist [facility] Navigation Clinic [phone]

## 2023-11-24 NOTE — Telephone Encounter (Signed)
 [facility] Clinical Social Work  Clinical Social Work was referred by new patient protocol for assessment of psychosocial needs.  Clinical Social Worker attempted to contact patient by phone and SNF and via mobile phone number to offer support and assess for needs.  CSW was unable to reach patient, left vm with direct contact.    Lizbeth Sprague, LCSW  Clinical Social Worker [facility]

## 2023-11-24 NOTE — Telephone Encounter (Signed)
 Oral Oncology Patient Advocate Encounter  Received notification that the request for prior authorization for Nubeqa has been denied due to Your plan does not allow coverage of this medication based on your prescriber answering No to the  following question(s): Does the patient have a diagnosis of non-metastatic castration-resistant prostate cancer (nmCRPC)?  Does the patient meet both of the following: A) the patient has a diagnosis of metastatic hormone-sensitive  prostate cancer (mHSPC), B) the requested drug will be used in combination with docetaxel?[name] Charlott Hamilton,  CPhT-Adv  she/her/hers [place]  [facility] Specialty Pharmacy Services Pharmacy Technician Patient Advocate Specialist III WL [email or web address]  Fax: [phone]

## 2023-11-24 NOTE — Telephone Encounter (Signed)
 6/24 Received referral, but incomplete. Missing pathology and trusp bx reports.  Sent secure chat to [place].  Waiting on reports before reach out to patient.

## 2023-11-24 NOTE — Telephone Encounter (Signed)
 I spoke with Jon at the care Facility where [name] is located. She scheduled Jaquay's appointments.

## 2023-11-24 NOTE — Telephone Encounter (Signed)
 Oral Oncology Patient Advocate Encounter   Began application for assistance for Nubeqa through [employer]  Patient [employer].   Application will be submitted upon completion of necessary supporting documentation.   [employer] phone number [phone].   I will continue to check the status until final determination.    Charlott Hamilton,  CPhT-Adv  she/her/hers [employer]  [facility] Specialty Pharmacy Services Pharmacy Technician Patient Advocate Specialist III WL [email or web address]  Fax: [phone]

## 2023-11-25 ENCOUNTER — Telehealth: Payer: Self-pay

## 2023-11-25 DIAGNOSIS — M6281 Muscle weakness (generalized): Secondary | ICD-10-CM | POA: Diagnosis not present

## 2023-11-25 DIAGNOSIS — R2689 Other abnormalities of gait and mobility: Secondary | ICD-10-CM | POA: Diagnosis not present

## 2023-11-25 DIAGNOSIS — C61 Malignant neoplasm of prostate: Secondary | ICD-10-CM | POA: Diagnosis not present

## 2023-11-25 LAB — METHYLMALONIC ACID, SERUM: Methylmalonic Acid, Quantitative: 147 nmol/L (ref 0–378)

## 2023-11-25 NOTE — Telephone Encounter (Signed)
 [facility] Clinical Social Work  Clinical Social Work was referred by new patient protocol for assessment of psychosocial needs.  Clinical Social Worker attempted to contact patient by phone to offer support and assess for needs.   Left Vmx2. Referral will be closed.  Lizbeth Sprague, LCSW  Clinical Social Worker [facility]

## 2023-11-25 NOTE — Telephone Encounter (Signed)
 Oral Oncology Patient Advocate Encounter Closing this encounter. Look for new encounter for appeal information.    Charlott Hamilton,  CPhT-Adv  she/her/hers [employer]  [facility] Specialty Pharmacy Services Pharmacy Technician Patient Advocate Specialist III WL [email or web address]  Fax: [phone]

## 2023-11-26 DIAGNOSIS — M6281 Muscle weakness (generalized): Secondary | ICD-10-CM | POA: Diagnosis not present

## 2023-11-26 DIAGNOSIS — R2689 Other abnormalities of gait and mobility: Secondary | ICD-10-CM | POA: Diagnosis not present

## 2023-11-26 DIAGNOSIS — C61 Malignant neoplasm of prostate: Secondary | ICD-10-CM | POA: Diagnosis not present

## 2023-11-26 NOTE — Progress Notes (Addendum)
 Histology and Location of Primary Cancer: Prostate Ca with mets to posterior RIGHT acetabulum.  [name] presents as referral from Dr. Pauletta Chihuahua [facility] Oncology) evaluation for radiation to the posterior RIGHT acetabulum.   Sites of Visceral and Bony Metastatic Disease: Posterior right acetabulum.   Location(s) of Symptomatic Metastases: Posterior right acetabulum.    10/09/2023 Dr. Donnice Brooks NM PET (PSMA) Skull to Mid Thigh CLINICAL DATA:  Prostate carcinoma with biochemical recurrence.    IMPRESSION: 1. Persistent radiotracer activity the posterior RIGHT acetabulum with associated bone sclerosis. Other bone lesions have resolved in the interval. 2. Interval resolution of metastatic adenopathy in the pelvis. 3. No new adenopathy or visceral metastasis identified 4. Foley catheter within collapsed bladder.    Past/Anticipated chemotherapy by medical oncology, if any:  Dr. Pauletta Chihuahua   Pain on a scale of 0-10 is:  0/10  If Spine Met(s), symptoms, if any, include: Bowel/Bladder retention or incontinence (please describe): No Numbness or weakness in extremities (please describe):   bilateral legs weak at times not too often per patient.  Left sided weakness. Current Decadron regimen, if applicable: No  Ambulatory status? Walker? Wheelchair?:  wheelchair  SAFETY ISSUES: Prior radiation?  No Pacemaker/ICD? No Possible current pregnancy? Male Is the patient on methotrexate? No  Current Complaints / other details:

## 2023-11-27 NOTE — Telephone Encounter (Signed)
 Oncology Pharmacist Encounter  I spoke to patients nursing home representative who stated that they did receive the email with the forms attached. They stated that they will get them filled out and sent back to our fax number so that we can fax off to the company. If they send them back today we will send them off at that time.   Muriel Hannold, PharmD Hematology/Oncology Clinical Pharmacist [facility] Navigation Clinic [phone]

## 2023-11-27 NOTE — Telephone Encounter (Signed)
 Oral Oncology Patient Advocate Encounter  Received notification that the request for prior authorization appeal for Nubeqa  has been denied due to in order for a drug to be considered for coverage under Medicare Part D, it must be used for a medically-accepted indication.[name]. (please see indexed document for more information)     [name] ([name]) Chet Burnet, CPhT  [facility], [place], [place], [place] Oral Chemotherapy Patient Advocate Phone: [phone]  Fax: [phone]

## 2023-11-27 NOTE — Telephone Encounter (Signed)
 Oral Oncology Patient Advocate Encounter  Per Izetta Odis Potters, had sent out PAP to nursing home in order to get patient's signature but when Izetta called yesterday to make sure they received them the nursing home stated that they did not receive them.   Katie had Ben resend them but when she called in the afternoon to check on status they stated they still had not received them.   Izetta was able to get an email address from the nursing home and [place] submitted the PAP application via email.  Once we receive patient's signature we will update this encounter.  Camillia Marcy ([name]) Chet Burnet, CPhT  [facility], [place], [place], [place] Oral Chemotherapy Patient Advocate Phone: [phone]  Fax: [phone]

## 2023-11-30 NOTE — Telephone Encounter (Signed)
 Oral Oncology Patient Advocate Encounter  Followed up with Taya at patients SNF. Forms have not been signed yet. I will continue to follow up.  Estefana Sox, CPhT-Adv Oncology Pharmacy Patient Advocate [facility]  Direct Number: [phone]  Fax: [phone]

## 2023-12-01 DIAGNOSIS — C61 Malignant neoplasm of prostate: Secondary | ICD-10-CM | POA: Diagnosis not present

## 2023-12-01 NOTE — Telephone Encounter (Signed)
 Oral Oncology Patient Advocate Encounter  Called to follow up on documents. Left vm asking for [facility].   Estefana Sox, CPhT-Adv Oncology Pharmacy Patient Advocate [facility]  Direct Number: [phone]  Fax: [phone]

## 2023-12-02 DIAGNOSIS — M6281 Muscle weakness (generalized): Secondary | ICD-10-CM | POA: Diagnosis not present

## 2023-12-02 DIAGNOSIS — C61 Malignant neoplasm of prostate: Secondary | ICD-10-CM | POA: Diagnosis not present

## 2023-12-02 DIAGNOSIS — R2689 Other abnormalities of gait and mobility: Secondary | ICD-10-CM | POA: Diagnosis not present

## 2023-12-03 DIAGNOSIS — M6281 Muscle weakness (generalized): Secondary | ICD-10-CM | POA: Diagnosis not present

## 2023-12-03 DIAGNOSIS — C61 Malignant neoplasm of prostate: Secondary | ICD-10-CM | POA: Diagnosis not present

## 2023-12-03 DIAGNOSIS — R2689 Other abnormalities of gait and mobility: Secondary | ICD-10-CM | POA: Diagnosis not present

## 2023-12-05 DIAGNOSIS — M6281 Muscle weakness (generalized): Secondary | ICD-10-CM | POA: Diagnosis not present

## 2023-12-05 DIAGNOSIS — R2689 Other abnormalities of gait and mobility: Secondary | ICD-10-CM | POA: Diagnosis not present

## 2023-12-05 DIAGNOSIS — C61 Malignant neoplasm of prostate: Secondary | ICD-10-CM | POA: Diagnosis not present

## 2023-12-06 DIAGNOSIS — R2689 Other abnormalities of gait and mobility: Secondary | ICD-10-CM | POA: Diagnosis not present

## 2023-12-06 DIAGNOSIS — M6281 Muscle weakness (generalized): Secondary | ICD-10-CM | POA: Diagnosis not present

## 2023-12-06 DIAGNOSIS — C61 Malignant neoplasm of prostate: Secondary | ICD-10-CM | POA: Diagnosis not present

## 2023-12-07 NOTE — Progress Notes (Signed)
 RN spoke with patient's daughter, Mercy, to help identify any barriers that may be present due to patient refusing signature for patient assistance forms.  Per SNF patient is refusing.   Patient will be present for Rad Onc and Palliative Consult on 7/7.  RN provided reassurance to daughter that we will work to obtain signature while he is in office.

## 2023-12-07 NOTE — Progress Notes (Signed)
 Radiation Oncology         [phone] ________________________________  Initial outpatient Consultation  Name: [name] MRN: [id number]  Date of Service: 12/08/2023 DOB: 27-Jan-1951  RR:Ypwizo, Rea, MD  Tina Pauletta BROCKS, MD   REFERRING PHYSICIAN: Tina Pauletta BROCKS, MD  DIAGNOSIS: There were no encounter diagnoses.  No diagnosis found.  HISTORY OF PRESENT ILLNESS: [name] is a 73 y.o. male seen at the request of Dr. Tina. In summary, he was initially referred to urology in 11/2021 after a hospital admission in 10/2021 for kidney failure and bladder outlet obstruction. He was also found to have a significantly elevated PSA of 150. He was subsequently diagnosed with Gleason 4+5 prostate cancer on 02/06/22 by Dr. Elisabeth. A staging PET scan on 02/26/22 revealed radiotracer activity within the prostate extending to left seminal vesicle, seven skeletal metastases in pelvis, spine, and left ribs, and a large right common iliac node. He was subsequently started on ADT in 01/2022 and abiraterone  in 04/2022. His PSA reached a nadir of 0.36 in 10/2022.   Since that time, his PSA has risen again, to 1.39 in 04/2023 and 3.22 in 08/2023. He underwent a restaging PSMA PET scan on 10/09/23 showing: persistent radiotracer activity in posterior right acetabulum with associated sclerosis; other bone lesions and lymphadenopathy resolved in the interim; no focal activity within prostate to suggest local recurrence; no new adenopathy or visceral metastasis identified. His most recent PSA from 11/05/23 revealed a further rise to 10.7, despite treatment. He was referred to Dr. Tina in medical oncology on 11/23/23, and his abiraterone  was changed to darolutamide .  PREVIOUS RADIATION THERAPY: No  PAST MEDICAL HISTORY:  Past Medical History:  Diagnosis Date   CVA (cerebral infarction) 01/03/2008   MRI: Acute 1 x 1.5 cm infarction affecting the left side of the pons.   Diabetes mellitus    DVT (deep venous  thrombosis) (HCC)    Hypertension    PAD (peripheral artery disease) (HCC)    Stroke [facility])    2008      PAST SURGICAL HISTORY: Past Surgical History:  Procedure Laterality Date   ABDOMINAL AORTOGRAM W/LOWER EXTREMITY Right 08/19/2023   Procedure: ABDOMINAL AORTOGRAM W/LOWER EXTREMITY;  Surgeon: Lanis Fonda FORBES, MD;  Location: [facility] INVASIVE CV LAB;  Service: Cardiovascular;  Laterality: Right;   COLONOSCOPY     LOWER EXTREMITY ANGIOGRAPHY Right 08/19/2023   Procedure: Lower Extremity Angiography;  Surgeon: Lanis Fonda FORBES, MD;  Location: [facility] INVASIVE CV LAB;  Service: Cardiovascular;  Laterality: Right;   LOWER EXTREMITY INTERVENTION Right 08/19/2023   Procedure: LOWER EXTREMITY INTERVENTION;  Surgeon: Lanis Fonda FORBES, MD;  Location: [facility] INVASIVE CV LAB;  Service: Cardiovascular;  Laterality: Right;   None      FAMILY HISTORY:  Family History  Problem Relation Age of Onset   Diabetes Mother    Heart attack Mother    Hypertension Mother    Diabetes Sister    Hypertension Sister    Stroke Sister        2017   Hypertension Brother    Hypertension Daughter    Hypertension Son    Colon cancer Neg Hx    Esophageal cancer Neg Hx    Stomach cancer Neg Hx    Rectal cancer Neg Hx     SOCIAL HISTORY:  Social History   Socioeconomic History   Marital status: Divorced    Spouse name: Not on file   Number of children: 3   Years of education:  14   Highest education level: Not on file  Occupational History   Occupation: Disabled-security guard    Employer: [employer]  Tobacco Use   Smoking status: Never   Smokeless tobacco: Never  Substance and Sexual Activity   Alcohol use: No   Drug use: No   Sexual activity: Never    Partners: Female  Other Topics Concern   Not on file  Social History Narrative   Divorced, lives alone   2 sons, 1 daughter   Retired/disabled   No caffeine   12/24/2015   Social Drivers of Health   Financial Resource Strain: Not on file  Food  Insecurity: No Food Insecurity (11/23/2023)   Hunger Vital Sign    Worried About Running Out of Food in the Last Year: Never true    Ran Out of Food in the Last Year: Never true  Transportation Needs: No Transportation Needs (11/23/2023)   PRAPARE - Administrator, Civil Service (Medical): No    Lack of Transportation (Non-Medical): No  Physical Activity: Not on file  Stress: Not on file  Social Connections: Unknown (10/10/2023)   Social Connection and Isolation Panel    Frequency of Communication with Friends and Family: More than three times a week    Frequency of Social Gatherings with Friends and Family: Three times a week    Attends Religious Services: Not on file    Active Member of Clubs or Organizations: Not on file    Attends Club or Organization Meetings: Not on file    Marital Status: Not on file  Intimate Partner Violence: Not At Risk (11/23/2023)   Humiliation, Afraid, Rape, and Kick questionnaire    Fear of Current or Ex-Partner: No    Emotionally Abused: No    Physically Abused: No    Sexually Abused: No    ALLERGIES: Beef-derived drug products, Pork allergy, and Shrimp flavor agent (non-screening)  MEDICATIONS:  Current Outpatient Medications  Medication Sig Dispense Refill   acetaminophen  (TYLENOL ) 325 MG tablet Take 2 tablets (650 mg total) by mouth every 6 (six) hours as needed for mild pain (pain score 1-3) (or Fever >/= 101).     ADMELOG SOLOSTAR 100 UNIT/ML KwikPen Inject into the skin.     amlodipine -olmesartan  (AZOR ) 10-20 MG tablet Take 1 tablet by mouth daily. PLEASE SCHEDULE APPT PRIOR TO NEXT REFILL 90 tablet 0   aspirin  EC 81 MG tablet Take 1 tablet (81 mg total) by mouth daily. Swallow whole. 30 tablet 12   atorvastatin  (LIPITOR) 40 MG tablet Take 1 tablet (40 mg total) by mouth daily. 90 tablet 3   carvedilol  (COREG ) 12.5 MG tablet Take 1 tablet (12.5 mg total) by mouth 2 (two) times daily with a meal. 180 tablet 3   clopidogrel  (PLAVIX ) 75  MG tablet Take 1 tablet (75 mg total) by mouth daily. 90 tablet 2   darolutamide  (NUBEQA ) 300 MG tablet Take 2 tablets (600 mg total) by mouth 2 (two) times daily with a meal. 120 tablet 11   insulin  aspart (NOVOLOG ) 100 UNIT/ML injection Inject 0-5 Units into the skin at bedtime.     insulin  aspart (NOVOLOG ) 100 UNIT/ML injection Inject 0-6 Units into the skin 3 (three) times daily with meals.     insulin  glargine-yfgn (SEMGLEE ) 100 UNIT/ML injection Inject 0.12 mLs (12 Units total) into the skin daily.     JARDIANCE  25 MG TABS tablet Take 25 mg by mouth daily.     metFORMIN  (GLUCOPHAGE ) 1000  MG tablet Take 1 tablet (1,000 mg total) by mouth 2 (two) times daily with a meal. 180 tablet 3   pantoprazole  (PROTONIX ) 20 MG tablet TAKE 1 TABLET BY MOUTH EVERY DAY 90 tablet 1   sertraline  (ZOLOFT ) 100 MG tablet TAKE 1 TABLET BY MOUTH EVERY DAY 90 tablet 2   tamsulosin  (FLOMAX ) 0.4 MG CAPS capsule Take 0.4 mg by mouth daily.     No current facility-administered medications for this encounter.    REVIEW OF SYSTEMS:  On review of systems, the patient reports that he is doing well overall. He denies any chest pain, shortness of breath, cough, fevers, chills, night sweats, unintended weight changes. He denies any bowel or bladder disturbances, and denies abdominal pain, nausea or vomiting. He denies any new musculoskeletal or joint aches or pains. A complete review of systems is obtained and is otherwise negative.    PHYSICAL EXAM:  Wt Readings from Last 3 Encounters:  11/23/23 204 lb (92.5 kg)  10/08/23 201 lb 14.4 oz (91.6 kg)  10/02/23 201 lb 15.1 oz (91.6 kg)   Temp Readings from Last 3 Encounters:  11/23/23 98.9 F (37.2 C) (Oral)  10/12/23 98.3 F (36.8 C) (Oral)  10/11/23 99.1 F (37.3 C) (Oral)   BP Readings from Last 3 Encounters:  11/23/23 (!) 150/54  10/12/23 (!) 163/74  10/11/23 (!) 132/55   Pulse Readings from Last 3 Encounters:  11/23/23 (!) 57  10/12/23 73  10/11/23 72     /10  In general this is a well appearing *** man in no acute distress. He's alert and oriented x4 and appropriate throughout the examination. Cardiopulmonary assessment is negative for acute distress and he exhibits normal effort.     KPS = ***  100 - Normal; no complaints; no evidence of disease. 90   - Able to carry on normal activity; minor signs or symptoms of disease. 80   - Normal activity with effort; some signs or symptoms of disease. 53   - Cares for self; unable to carry on normal activity or to do active work. 60   - Requires occasional assistance, but is able to care for most of his personal needs. 50   - Requires considerable assistance and frequent medical care. 40   - Disabled; requires special care and assistance. 30   - Severely disabled; hospital admission is indicated although death not imminent. 20   - Very sick; hospital admission necessary; active supportive treatment necessary. 10   - Moribund; fatal processes progressing rapidly. 0     - Dead  Karnofsky DA, Abelmann WH, Craver LS and Burchenal [facility] [phone]) The use of the nitrogen mustards in the palliative treatment of carcinoma: with particular reference to bronchogenic carcinoma Cancer 1 634-56  LABORATORY DATA:  Lab Results  Component Value Date   WBC 10.5 11/23/2023   HGB 10.8 (L) 11/23/2023   HCT 33.6 (L) 11/23/2023   MCV 88.7 11/23/2023   PLT 203 11/23/2023   Lab Results  Component Value Date   NA 139 11/23/2023   K 3.9 11/23/2023   CL 105 11/23/2023   CO2 26 11/23/2023   Lab Results  Component Value Date   ALT 12 11/23/2023   AST 15 11/23/2023   ALKPHOS 78 11/23/2023   BILITOT 0.5 11/23/2023     RADIOGRAPHY: No results found.    IMPRESSION/PLAN: 1. 73 y.o. man with metastatic castration-resistant prostate cancer***  Today, we talked to the patient and family about the findings and workup thus  far. We discussed the natural history of metastatic prostate cancer and general treatment,  highlighting the role of radiotherapy in the management of osseous metastatic disease. We discussed the available radiation techniques, and focused on the details and logistics of delivery. We reviewed the anticipated acute and late sequelae associated with radiation in this setting. The patient was encouraged to ask questions that were answered to his satisfaction.  At the end of our discussion, the patient ***   I personally spent *** minutes in this encounter including chart review, reviewing radiological studies, meeting face-to-face with the patient, entering orders and completing documentation.   ------------------------------------------------   Donnice Barge, MD [facility]  Radiation Oncology Direct Dial: [phone]  Fax: [phone] [email or web address]  Skype  [email or web address]   This document serves as a record of services personally performed by Donnice Barge, MD. It was created on his behalf by Izetta Neither, a trained medical scribe. The creation of this record is based on the scribe's personal observations and the provider's statements to them. This document has been checked and approved by the attending provider.

## 2023-12-07 NOTE — Telephone Encounter (Signed)
 Oral Oncology Patient Advocate Encounter  I contacted the patients daughter to obtain household size and income  info for PAP application. The patient will sign in  person at his appointment on 12-08-23. I will continue to follow up.

## 2023-12-08 ENCOUNTER — Encounter: Admitting: Nutrition

## 2023-12-08 ENCOUNTER — Encounter: Payer: Self-pay | Admitting: Nurse Practitioner

## 2023-12-08 ENCOUNTER — Ambulatory Visit
Admission: RE | Admit: 2023-12-08 | Discharge: 2023-12-08 | Disposition: A | Discharge status: Home / Self Care | Source: Ambulatory Visit | Attending: Radiation Oncology | Admitting: Radiation Oncology

## 2023-12-08 ENCOUNTER — Inpatient Hospital Stay: Admitting: Nurse Practitioner

## 2023-12-08 ENCOUNTER — Encounter: Payer: Self-pay | Admitting: Radiation Oncology

## 2023-12-08 VITALS — BP 163/69 | HR 58 | Temp 97.9°F | Resp 20 | Ht 67.0 in

## 2023-12-08 DIAGNOSIS — I509 Heart failure, unspecified: Secondary | ICD-10-CM | POA: Insufficient documentation

## 2023-12-08 DIAGNOSIS — M6281 Muscle weakness (generalized): Secondary | ICD-10-CM | POA: Diagnosis not present

## 2023-12-08 DIAGNOSIS — C61 Malignant neoplasm of prostate: Secondary | ICD-10-CM | POA: Diagnosis not present

## 2023-12-08 DIAGNOSIS — C7951 Secondary malignant neoplasm of bone: Secondary | ICD-10-CM | POA: Insufficient documentation

## 2023-12-08 DIAGNOSIS — F5102 Adjustment insomnia: Secondary | ICD-10-CM

## 2023-12-08 DIAGNOSIS — N182 Chronic kidney disease, stage 2 (mild): Secondary | ICD-10-CM | POA: Insufficient documentation

## 2023-12-08 DIAGNOSIS — Z515 Encounter for palliative care: Secondary | ICD-10-CM | POA: Diagnosis not present

## 2023-12-08 DIAGNOSIS — F32A Depression, unspecified: Secondary | ICD-10-CM | POA: Insufficient documentation

## 2023-12-08 DIAGNOSIS — R2689 Other abnormalities of gait and mobility: Secondary | ICD-10-CM | POA: Diagnosis not present

## 2023-12-08 DIAGNOSIS — R200 Anesthesia of skin: Secondary | ICD-10-CM | POA: Insufficient documentation

## 2023-12-08 DIAGNOSIS — E1122 Type 2 diabetes mellitus with diabetic chronic kidney disease: Secondary | ICD-10-CM | POA: Insufficient documentation

## 2023-12-08 DIAGNOSIS — G893 Neoplasm related pain (acute) (chronic): Secondary | ICD-10-CM | POA: Diagnosis not present

## 2023-12-08 DIAGNOSIS — E785 Hyperlipidemia, unspecified: Secondary | ICD-10-CM | POA: Insufficient documentation

## 2023-12-08 DIAGNOSIS — I69398 Other sequelae of cerebral infarction: Secondary | ICD-10-CM | POA: Insufficient documentation

## 2023-12-08 DIAGNOSIS — I130 Hypertensive heart and chronic kidney disease with heart failure and stage 1 through stage 4 chronic kidney disease, or unspecified chronic kidney disease: Secondary | ICD-10-CM | POA: Insufficient documentation

## 2023-12-08 DIAGNOSIS — Z7189 Other specified counseling: Secondary | ICD-10-CM | POA: Diagnosis not present

## 2023-12-08 DIAGNOSIS — Z993 Dependence on wheelchair: Secondary | ICD-10-CM | POA: Insufficient documentation

## 2023-12-08 HISTORY — DX: Malignant neoplasm of prostate: C61

## 2023-12-08 MED ORDER — GLUCERNA 1.5 CAL/CARBSTEADY PO LIQD: 237.0000 mL | Freq: Two times a day (BID) | ORAL | 12 refills | Status: AC

## 2023-12-08 NOTE — Telephone Encounter (Signed)
 Oral Oncology Patient Advocate Encounter  I received a signed PAP application by patient for [employer] . I faxed to [employer] at [phone].  I will continue to follow up.

## 2023-12-08 NOTE — Progress Notes (Signed)
 Palliative Medicine [facility]  [phone] [phone]   Name: [name] Date: 12/08/2023 MRN: [id number]  DOB: 08/19/1950  Patient Care Team: Adele Song, MD as PCP - General Verlin Lonni BIRCH, MD as PCP - Cardiology (Cardiology) Verlin Lonni BIRCH, MD (Cardiology) Vertell Pont, RN as Oncology Nurse Navigator    REASON FOR CONSULTATION:  Mr. 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  Medical problems include history of CVA with residual right-sided numbness, dysarthria, dysphagia, type 2 diabetes, hypertension, PVD, CHF, HLD, depression, and stage 2 CKD.   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 GS 4+5 GG5 in 4 cores. PSA 150. PET with multifocal skeletal metastasis involving the pelvis, spine and LEFT ribs and a large RIGHT common iliac node. He was started on ADT in 01/2022 and AAP in 04/2022. PSA nadir in 10/2022 of 0.36 followed by rising PSA.    Repeat PET showed persistent radiotracer activity the posterior RIGHT acetabulum with associated bone sclerosis. Other bone lesions have resolved in the interval.  Plan is to refer to radiation oncology for metastasis to the right posterior acetabulum and transition from abiraterone  and prednisone  to darolutamide . Tx is palliative.  Palliative is seeing patient for symptom management and goals of care.   History of Present Illness:   I introduced myself, Maygan RN, and Palliative's role in collaboration with the oncology team. Concept of Palliative Care was introduced as specialized medical care for people and their families living with serious illness.  It focuses on providing relief from the symptoms and stress of a serious illness.  The goal is to improve quality of life for both the patient and the family. Values and goals of care important to patient and  family were attempted to be elicited.  Discussed the use of AI scribe software for clinical note transcription with the patient, who gave verbal consent to proceed.  He experiences significant sleep disturbances due to constant noise in his living environment, describing it as 'a constant bombardment of noise.' He has difficulty falling asleep and staying asleep, and is frustrated with the lack of response from the facility staff despite raising the issue. He has not tried using white noise apps or headphones to mitigate the noise. CNA from facility who accompanies him to this visit agrees to follow up.   He is not currently experiencing any pain. When he has pain it is well-controlled with Tylenol , which he takes as needed. Med list reviewed and updated based off of his facility MAR. His blood sugar is monitored daily at SNF, with no issues reported with his current medication regimen. He indicates he is compliant with his medication as is administered at the facility.   He is on a regular diet at his facility, which he notes is not suitable for his diabetes as it is 'full of starch and sugar.' He is very concerned about his nutritional status and the lack of a diabetic diet and the absence of refrigeration for storing appropriate foods. At home, he has access to a refrigerator and can maintain a better diet. He is scheduled to see a dietitian on July 25th.  No current issues with breathing, bowel movements, or mood. No depression or anxiety. His appetite is good, but he has difficulty chewing food.  He currently lives in a facility but wants to return home.  He is currently largely  wheelchair-bound but is able to use a hemi walker with therapy.  He shares that he has difficulty completing self-care due to facility set up.  He reports he continues to work with therapy in hopes of improving his functional status and also returning home.  He notes that he cannot live at home alone and is looking for a  roommate but has not had time to interview anyone.    Goals of Care:  Extensive goals of care discussion completed.  Patient indicates his health is ok considering his limited access to healthy food and difficulty sleeping.  He seems to have a fair understanding of his health with some limited insight into his current condition.  He gives consent for an open on discussion of his current medical problems.  Therefore, we discussed his castrate resistant metastatic prostate cancer and prognosis as well as that his treatment is now palliative vs curative and what that means. We also discussed his metastatic prostate and what it means in the larger context of his on-going co-morbidities (ie CVA, impaired functional status, T2DM, vascular disease, HTN, etc). Natural disease trajectory and expectations were discussed.  He verbalized understanding is thankful for the discussion regarding his health condition.  We discussed that while we hope the chemo will be effective at controlling his cancer and limiting its spread, we are concerned about the possibility of his health worsening and worsening rapidly.  Therefore, discussed advanced care planning for things he would want or not want if his health were to worsen.  This discussion includes what quality of life means for him.  He indicates that quality of life means being able to either maintain or improve his current level of functioning. He hopes to return home. He would not want to be bedbound and dependent on others for all ADLs.  He desires to be able to be aware/cognizant enough to have conversations and spend meaningful time with family.  We discussed given his current cancer diagnosis as well as his other comorbidities, it would be appropriate for him to consider DNR/DNI.  We also discussed the limitations of CPR in older adults with advanced illness and with similar co morbidities.  He states this makes sense and would like to pursue DNR/DNI however, he feels he  needs to discuss this transition with his children.  He has never had discussions regarding his goals and wishes were his health to acutely worsen with his children but plans to do so soon.  We also discussed the utility of having a healthcare power of attorney.  He states that he will speak to his children and determine who he would want to be in HCPOA.  Educated that completing HCPOA documentation and something we can assist with at the cancer center.  He is grateful for this and will let us  know when he decides on HCPOA.  We reviewed MOST form and extensive education was provided with each section.  He indicates at this time leaning towards DNR/DNI, limited interventions, okay for hospitalization, noninvasive airway support, IV fluids, antibiotics, and feeding tube trial but again wishes to speak to his family first.  He was provided with a copy of a MOST form as well as a copy of the hard choices for loving people booklet.  All questions were answered.  I discussed the importance of continued conversation with family and their medical providers regarding overall plan of care and treatment options, ensuring decisions are within the context of the patients values and GOCs.  Established therapeutic  relationship. Education provided on palliative's role in collaboration with their Oncology/Radiation team.  SOCIAL HISTORY:     reports that he has never smoked. He has never used smokeless tobacco. He reports that he does not drink alcohol and does not use drugs.  ADVANCE DIRECTIVES:  Currently full code, no MOST/HCPOA established, discussed DNR/DNI, will plan to follow up at next appt after he can discuss this with his children  CODE STATUS: Full code  PAST MEDICAL HISTORY: Past Medical History:  Diagnosis Date   CVA (cerebral infarction) 01/03/2008   MRI: Acute 1 x 1.5 cm infarction affecting the left side of the pons.   Diabetes mellitus    DVT (deep venous thrombosis) (HCC)    Hypertension     PAD (peripheral artery disease) (HCC)    Prostate cancer (HCC)    Stroke [facility])    2008    PAST SURGICAL HISTORY:  Past Surgical History:  Procedure Laterality Date   ABDOMINAL AORTOGRAM W/LOWER EXTREMITY Right 08/19/2023   Procedure: ABDOMINAL AORTOGRAM W/LOWER EXTREMITY;  Surgeon: Lanis Fonda BRAVO, MD;  Location: [facility] INVASIVE CV LAB;  Service: Cardiovascular;  Laterality: Right;   COLONOSCOPY     LOWER EXTREMITY ANGIOGRAPHY Right 08/19/2023   Procedure: Lower Extremity Angiography;  Surgeon: Lanis Fonda BRAVO, MD;  Location: [facility] INVASIVE CV LAB;  Service: Cardiovascular;  Laterality: Right;   LOWER EXTREMITY INTERVENTION Right 08/19/2023   Procedure: LOWER EXTREMITY INTERVENTION;  Surgeon: Lanis Fonda BRAVO, MD;  Location: [facility] INVASIVE CV LAB;  Service: Cardiovascular;  Laterality: Right;   None      HEMATOLOGY/ONCOLOGY HISTORY:  Oncology History  Prostate cancer (HCC)  11/2021 Initial Diagnosis   Prostate cancer (HCC) De novo with bone, LN metastases. PSA 150. GG5 4+5 in 4 cores. Rest were 4+3 lesions   02/26/2022 PET scan   PSMA PET 1. Intense activity throughout the prostate gland consistent with primary prostate adenocarcinoma. Prostate gland enlarged. Foley catheter in place. 2. Suspicion of local prostate cancer extension to the LEFT seminal vesicle. 3. Multifocal radiotracer avid skeletal metastasis involving the pelvis, spine and LEFT ribs. There are seven lesions identified. 4. Large RIGHT common iliac node with mild radiotracer activity is favored necrotic nodal metastasis. 5. Incidental finding of a RIGHT inguinal hernia which contains nonobstructed loop of small bowel.   10/09/2023 PET scan   PSMA PET 1. Persistent radiotracer activity the posterior RIGHT acetabulum with associated bone sclerosis. Other bone lesions have resolved in the interval. 2. Interval resolution of metastatic adenopathy in the pelvis. 3. No new adenopathy or visceral metastasis identified 4.  Foley catheter within collapsed bladder.     ALLERGIES:  is allergic to beef-derived drug products, pork allergy, and shrimp flavor agent (non-screening).  MEDICATIONS:  Current Outpatient Medications  Medication Sig Dispense Refill   abiraterone  acetate (ZYTIGA ) 500 MG tablet Take 500 mg by mouth daily.     acetaminophen  (TYLENOL ) 325 MG tablet Take 2 tablets (650 mg total) by mouth every 6 (six) hours as needed for mild pain (pain score 1-3) (or Fever >/= 101).     ADMELOG SOLOSTAR 100 UNIT/ML KwikPen Inject into the skin.     amlodipine -olmesartan  (AZOR ) 10-20 MG tablet Take 1 tablet by mouth daily. PLEASE SCHEDULE APPT PRIOR TO NEXT REFILL 90 tablet 0   aspirin  EC 81 MG tablet Take 1 tablet (81 mg total) by mouth daily. Swallow whole. 30 tablet 12   atorvastatin  (LIPITOR) 40 MG tablet Take 1 tablet (40 mg total) by mouth daily. 90  tablet 3   carvedilol  (COREG ) 12.5 MG tablet Take 1 tablet (12.5 mg total) by mouth 2 (two) times daily with a meal. 180 tablet 3   clopidogrel  (PLAVIX ) 75 MG tablet Take 1 tablet (75 mg total) by mouth daily. 90 tablet 2   insulin  aspart (NOVOLOG ) 100 UNIT/ML injection Inject 0-5 Units into the skin at bedtime.     insulin  aspart (NOVOLOG ) 100 UNIT/ML injection Inject 0-6 Units into the skin 3 (three) times daily with meals.     insulin  glargine-yfgn (SEMGLEE ) 100 UNIT/ML injection Inject 0.12 mLs (12 Units total) into the skin daily.     JARDIANCE  25 MG TABS tablet Take 25 mg by mouth daily.     metFORMIN  (GLUCOPHAGE ) 1000 MG tablet Take 1 tablet (1,000 mg total) by mouth 2 (two) times daily with a meal. 180 tablet 3   Nutritional Supplements (GLUCERNA 1.5 CAL/CARBSTEADY) LIQD Take 237 mLs by mouth 2 (two) times daily. 14220 mL 12   pantoprazole  (PROTONIX ) 20 MG tablet TAKE 1 TABLET BY MOUTH EVERY DAY 90 tablet 1   sertraline  (ZOLOFT ) 100 MG tablet TAKE 1 TABLET BY MOUTH EVERY DAY 90 tablet 2   darolutamide  (NUBEQA ) 300 MG tablet Take 2 tablets (600 mg total)  by mouth 2 (two) times daily with a meal. 120 tablet 11   tamsulosin  (FLOMAX ) 0.4 MG CAPS capsule Take 0.4 mg by mouth daily. (Patient not taking: Reported on 12/08/2023)     No current facility-administered medications for this visit.    VITAL SIGNS: There were no vitals taken for this visit. There were no vitals filed for this visit.  Estimated body mass index is 31.95 kg/m as calculated from the following:   Height as of an earlier encounter on 12/08/23: 5' 7 (1.702 m).   Weight as of 11/23/23: 204 lb (92.5 kg).  LABS: CBC:    Component Value Date/Time   WBC 10.5 11/23/2023 1221   WBC 11.1 (H) 10/11/2023 0718   HGB 10.8 (L) 11/23/2023 1221   HCT 33.6 (L) 11/23/2023 1221   PLT 203 11/23/2023 1221   MCV 88.7 11/23/2023 1221   NEUTROABS 7.0 11/23/2023 1221   LYMPHSABS 2.4 11/23/2023 1221   MONOABS 0.8 11/23/2023 1221   EOSABS 0.3 11/23/2023 1221   BASOSABS 0.1 11/23/2023 1221   Comprehensive Metabolic Panel:    Component Value Date/Time   NA 139 11/23/2023 1221   NA 137 03/13/2023 1651   K 3.9 11/23/2023 1221   CL 105 11/23/2023 1221   CO2 26 11/23/2023 1221   BUN 22 11/23/2023 1221   BUN 29 (H) 03/13/2023 1651   CREATININE 0.95 11/23/2023 1221   CREATININE 1.02 11/05/2015 1203   GLUCOSE 164 (H) 11/23/2023 1221   CALCIUM  9.1 11/23/2023 1221   AST 15 11/23/2023 1221   ALT 12 11/23/2023 1221   ALKPHOS 78 11/23/2023 1221   BILITOT 0.5 11/23/2023 1221   PROT 7.1 11/23/2023 1221   ALBUMIN 3.8 11/23/2023 1221    RADIOGRAPHIC STUDIES: No results found.  PERFORMANCE STATUS (ECOG) : 3 - Symptomatic, >50% confined to bed/wheelchair  Review of Systems Unless otherwise noted, a complete review of systems is negative.  Physical Exam General: NAD, alert, pleasant, sitting up in wheelchair, speech is aphasic but largely intelligible Pulmonary: Respirations are even and unlabored Extremities: right sided weakness Skin: no visible rashes Neurological: Alert and oriented  x3   Assessment & Plan  Goals of care discussion: Extensive GOC discussion completed. Code status: Full code for the time  being.  He is considering transitioning to: DNR/DNI, continue current chemo/cancer therapies and treat the treatable.  Okay with hospitalization.  However, wishes to discuss with his children before making any changes to code status/completing paperwork. Will speak with children and determine who would be an appropriate healthcare power of attorney and follow back up with us  to assist with paperwork. Encouraged him speak with his children regarding his GOC and discuss DNR/DNI and encourage his children to attend the next appointment if they have questions to ensure they are fully aware of his goals and wishes Palliative team to continue to follow for ongoing goals of care discussion  Cancer related pain:  Stable on current regimen.   Continue Tylenol  as needed.   Plan to undergo radiation for bone metastasis to the right acetabulum.   Palliative to continue to monitor for progression or worsening of his pain and adjust regimen as indicated.  Sleep disturbance:  Related to environmental factors at his facility.   Encouraged the use of white noise, earplugs, or noise canceling headphones to assist with this.   The aide who accompanied him to this visit also agreed to speak with the facility social worker about moving him into a room in a quieter area of the facility.   Metastatic prostate cancer Current chemotherapy is palliative, aimed at controlling the disease rather than curing it.   Plan is to transition from androgen blocking to an oral chemo regimen.   Will undergo palliative radiation for bone mets.   Continue to follow with medical oncology for management.  Type 2 diabetes mellitus Continue current medication regimen per PCP.  He is currently on a regular diet at SNF. D/c regular diet and order carb controlled diet Order Glucerna BID for nutritional  support Follow up with dietitian on 07/25 for dietary management   Palliative team will plan to follow-up in approximately 1 month for repeat visit to continue to address symptom management and goals of care.    Patient expressed understanding and was in agreement with this plan. He also understands that He can call the clinic at any time with any questions, concerns, or complaints.   Thank you for your referral and allowing Palliative to assist in Mr. Tyri Elmore Dominski's care.   Number and complexity of problems addressed: HIGH - 1 or more chronic illnesses with SEVERE exacerbation, progression, or side effects of treatment - advanced cancer, pain. Reviewed notes from other providers, previous encounters, and independently reviewed labs (mild anemia, renal function stable, Blood sugar elevated at 164, uncertain whether or not it is fasting, testosterone  less than 3, PSA 10.5.  Labs are from 11/23/2023)  Visit consisted of counseling and education dealing with the complex and emotionally intense issues of symptom management and palliative care in the setting of serious and potentially life-threatening illness.  Signed by: Laymon Pinal, DNP, AGNP-c, ACHPN Cosigned: Levon Borer, AGPCNP-BC Palliative Medicine Team/[facility]

## 2023-12-09 ENCOUNTER — Telehealth: Payer: Self-pay | Admitting: Nurse Practitioner

## 2023-12-09 DIAGNOSIS — R2689 Other abnormalities of gait and mobility: Secondary | ICD-10-CM | POA: Diagnosis not present

## 2023-12-09 DIAGNOSIS — C61 Malignant neoplasm of prostate: Secondary | ICD-10-CM | POA: Diagnosis not present

## 2023-12-09 DIAGNOSIS — R338 Other retention of urine: Secondary | ICD-10-CM | POA: Diagnosis not present

## 2023-12-09 DIAGNOSIS — R3914 Feeling of incomplete bladder emptying: Secondary | ICD-10-CM | POA: Diagnosis not present

## 2023-12-09 DIAGNOSIS — M6281 Muscle weakness (generalized): Secondary | ICD-10-CM | POA: Diagnosis not present

## 2023-12-09 NOTE — Telephone Encounter (Signed)
 I spoke with Zynette at  [facility] to inform them of Filbert's appointment on 8/4

## 2023-12-09 NOTE — Telephone Encounter (Signed)
 I contacted [facility] to schedule an appointment for Palliative Care the front desk associate placed me on hold, transferred me to Jon Jon then placed me on hold 5+ minutes, I hung up and called back. I have scheduled [name] for Palliative care on 8/4 the same day he will see Dr. Patrcia for treatment. I held with [facility] for 6 minutes. Niasia placed me on hold again after the 2nd attempt to inform them that Tyjai is scheduled for an appointment.

## 2023-12-10 DIAGNOSIS — C61 Malignant neoplasm of prostate: Secondary | ICD-10-CM | POA: Diagnosis not present

## 2023-12-10 DIAGNOSIS — F331 Major depressive disorder, recurrent, moderate: Secondary | ICD-10-CM | POA: Diagnosis not present

## 2023-12-10 DIAGNOSIS — M6281 Muscle weakness (generalized): Secondary | ICD-10-CM | POA: Diagnosis not present

## 2023-12-10 DIAGNOSIS — R2689 Other abnormalities of gait and mobility: Secondary | ICD-10-CM | POA: Diagnosis not present

## 2023-12-11 ENCOUNTER — Ambulatory Visit
Admission: RE | Admit: 2023-12-11 | Discharge: 2023-12-11 | Disposition: A | Discharge status: Home / Self Care | Source: Ambulatory Visit | Attending: Radiation Oncology | Admitting: Radiation Oncology

## 2023-12-11 ENCOUNTER — Telehealth: Payer: Self-pay | Admitting: Radiation Oncology

## 2023-12-11 ENCOUNTER — Telehealth: Payer: Self-pay

## 2023-12-11 DIAGNOSIS — Z510 Encounter for antineoplastic radiation therapy: Secondary | ICD-10-CM | POA: Diagnosis not present

## 2023-12-11 DIAGNOSIS — C61 Malignant neoplasm of prostate: Secondary | ICD-10-CM | POA: Insufficient documentation

## 2023-12-11 DIAGNOSIS — M6281 Muscle weakness (generalized): Secondary | ICD-10-CM | POA: Diagnosis not present

## 2023-12-11 DIAGNOSIS — R2689 Other abnormalities of gait and mobility: Secondary | ICD-10-CM | POA: Diagnosis not present

## 2023-12-11 DIAGNOSIS — C7951 Secondary malignant neoplasm of bone: Secondary | ICD-10-CM | POA: Diagnosis not present

## 2023-12-11 NOTE — Telephone Encounter (Signed)
 Oral Oncology Patient Advocate Encounter  Called to check status of assistance application for Nubeq[employer]  through [employer].  Per Phone call after 3 days of a confirmed fax , [employer] does not  have application on file for patient. I have refaxed the application to them.   Status is pending 12/11/23.   [employer] Patient Assistance Program  phone [phone].  I will continue to check status until final determination.   Charlott Hamilton,  CPhT-Adv  she/her/hers [facility]  [facility] Pharmacy Technician Patient Advocate Specialist III [facility] Phone: [phone]  Fax: [phone] [email or web address]

## 2023-12-11 NOTE — Progress Notes (Signed)
  Radiation Oncology         [phone] ________________________________  Name: [name] MRN: [id number]  Date: 12/11/2023  DOB: 07/16/1950  STEREOTACTIC BODY RADIOTHERAPY SIMULATION AND TREATMENT PLANNING NOTE    ICD-10-CM   1. Prostate cancer [facility])  C61       DIAGNOSIS:  73 yo man with a solitary oligometastatic bone metastasis from prostate cancer.  NARRATIVE:  The patient was brought to the CT Simulation planning suite.  Identity was confirmed.  All relevant records and images related to the planned course of therapy were reviewed.  The patient freely provided informed written consent to proceed with treatment after reviewing the details related to the planned course of therapy. The consent form was witnessed and verified by the simulation staff.  Then, the patient was set-up in a stable reproducible  supine position for radiation therapy.  A BodyFix immobilization pillow was fabricated for reproducible positioning.  Surface markings were placed.  The CT images were loaded into the planning software.  The gross target volumes (GTV) and planning target volumes (PTV) were delinieated, and avoidance structures were contoured.  Treatment planning then occurred.  The radiation prescription was entered and confirmed.  A total of two complex treatment devices were fabricated in the form of the BodyFix immobilization pillow and a neck accuform cushion.  I have requested : 3D Simulation  I have requested a DVH of the following structures: targets and all normal structures near the target including bowel, bladder, femoral head, skin and others as noted on the radiation plan to maintain doses in adherence with established limits  SPECIAL TREATMENT PROCEDURE:  The planned course of therapy using radiation constitutes a special treatment procedure. Special care is required in the management of this patient for the following reasons. High dose per fraction requiring special monitoring for increased  toxicities of treatment including daily imaging..  The special nature of the planned course of radiotherapy will require increased physician supervision and oversight to ensure patient's safety with optimal treatment outcomes.    This requires extended time and effort.    PLAN:  The patient will receive 50 Gy in 5 fractions.  ________________________________  Donnice FELIX Patrcia, M.D.

## 2023-12-11 NOTE — Telephone Encounter (Signed)
 RN returned call from [name] (duaghter) who was a bit anxious/upset that [name] was coming in today fro radiation appointment.  According to her her father told her that he was getting radiation treatment this morning and she wanted to know why if he was on medication for his cancer.  RN explained to patient daughter that [name] appointment today (CT simulation) was for the planning portion of radiation not that he was getting treatment.  For information on his medication she was advised to reach out to [facility] medical oncology doctor Dr. Fredrik office and talk with his nurse.  She verbalized understanding and stated she will call Dr. Fredrik office.

## 2023-12-11 NOTE — Telephone Encounter (Signed)
 Received call from [facility] where pt is currently residing. They advised they needed assistance with upcoming appts for pt. Call transferred to Support RTT.

## 2023-12-11 NOTE — Progress Notes (Signed)
 RN spoke with patient's daughter, Mercy, to review recommendations of SBRT.  [name] had several questions about why he was receiving radiation and treatment.  RN provided education of PSMA PET results, verbalized understanding.   [name] stated, she has not been able to attend appointments and will work on getting FMLA.  RN provided contact information for FMLA.  Also encouraged if she would be able to attend next appointment with medical oncology on 7/25.  Verbalized understanding.

## 2023-12-11 NOTE — Telephone Encounter (Signed)
 Oral Oncology Patient Advocate Encounter  Update from [employer] proof of his income is needed. Patient's daughter Mercy will call me Monday and let me know if she is able to locate the this info.  I will continue to follow up.

## 2023-12-15 DIAGNOSIS — I69320 Aphasia following cerebral infarction: Secondary | ICD-10-CM | POA: Diagnosis not present

## 2023-12-15 DIAGNOSIS — E118 Type 2 diabetes mellitus with unspecified complications: Secondary | ICD-10-CM | POA: Diagnosis not present

## 2023-12-15 DIAGNOSIS — K219 Gastro-esophageal reflux disease without esophagitis: Secondary | ICD-10-CM | POA: Diagnosis not present

## 2023-12-15 DIAGNOSIS — M6281 Muscle weakness (generalized): Secondary | ICD-10-CM | POA: Diagnosis not present

## 2023-12-15 DIAGNOSIS — Z510 Encounter for antineoplastic radiation therapy: Secondary | ICD-10-CM | POA: Diagnosis not present

## 2023-12-15 DIAGNOSIS — N139 Obstructive and reflux uropathy, unspecified: Secondary | ICD-10-CM | POA: Diagnosis not present

## 2023-12-15 DIAGNOSIS — I739 Peripheral vascular disease, unspecified: Secondary | ICD-10-CM | POA: Diagnosis not present

## 2023-12-15 DIAGNOSIS — G8191 Hemiplegia, unspecified affecting right dominant side: Secondary | ICD-10-CM | POA: Diagnosis not present

## 2023-12-15 DIAGNOSIS — C61 Malignant neoplasm of prostate: Secondary | ICD-10-CM | POA: Diagnosis not present

## 2023-12-15 DIAGNOSIS — M25461 Effusion, right knee: Secondary | ICD-10-CM | POA: Diagnosis not present

## 2023-12-15 DIAGNOSIS — R1311 Dysphagia, oral phase: Secondary | ICD-10-CM | POA: Diagnosis not present

## 2023-12-15 DIAGNOSIS — C7951 Secondary malignant neoplasm of bone: Secondary | ICD-10-CM | POA: Diagnosis not present

## 2023-12-15 DIAGNOSIS — F32A Depression, unspecified: Secondary | ICD-10-CM | POA: Diagnosis not present

## 2023-12-15 DIAGNOSIS — I6930 Unspecified sequelae of cerebral infarction: Secondary | ICD-10-CM | POA: Diagnosis not present

## 2023-12-16 DIAGNOSIS — F32A Depression, unspecified: Secondary | ICD-10-CM | POA: Diagnosis not present

## 2023-12-16 DIAGNOSIS — I10 Essential (primary) hypertension: Secondary | ICD-10-CM | POA: Diagnosis not present

## 2023-12-16 DIAGNOSIS — R2689 Other abnormalities of gait and mobility: Secondary | ICD-10-CM | POA: Diagnosis not present

## 2023-12-16 DIAGNOSIS — M6281 Muscle weakness (generalized): Secondary | ICD-10-CM | POA: Diagnosis not present

## 2023-12-16 DIAGNOSIS — G8191 Hemiplegia, unspecified affecting right dominant side: Secondary | ICD-10-CM | POA: Diagnosis not present

## 2023-12-16 DIAGNOSIS — G9589 Other specified diseases of spinal cord: Secondary | ICD-10-CM | POA: Diagnosis not present

## 2023-12-16 DIAGNOSIS — K219 Gastro-esophageal reflux disease without esophagitis: Secondary | ICD-10-CM | POA: Diagnosis not present

## 2023-12-16 DIAGNOSIS — E785 Hyperlipidemia, unspecified: Secondary | ICD-10-CM | POA: Diagnosis not present

## 2023-12-16 DIAGNOSIS — C61 Malignant neoplasm of prostate: Secondary | ICD-10-CM | POA: Diagnosis not present

## 2023-12-17 DIAGNOSIS — R2689 Other abnormalities of gait and mobility: Secondary | ICD-10-CM | POA: Diagnosis not present

## 2023-12-17 DIAGNOSIS — C61 Malignant neoplasm of prostate: Secondary | ICD-10-CM | POA: Diagnosis not present

## 2023-12-17 DIAGNOSIS — M6281 Muscle weakness (generalized): Secondary | ICD-10-CM | POA: Diagnosis not present

## 2023-12-18 DIAGNOSIS — R339 Retention of urine, unspecified: Secondary | ICD-10-CM | POA: Diagnosis not present

## 2023-12-18 DIAGNOSIS — N139 Obstructive and reflux uropathy, unspecified: Secondary | ICD-10-CM | POA: Diagnosis not present

## 2023-12-19 DIAGNOSIS — R2689 Other abnormalities of gait and mobility: Secondary | ICD-10-CM | POA: Diagnosis not present

## 2023-12-19 DIAGNOSIS — C61 Malignant neoplasm of prostate: Secondary | ICD-10-CM | POA: Diagnosis not present

## 2023-12-19 DIAGNOSIS — M6281 Muscle weakness (generalized): Secondary | ICD-10-CM | POA: Diagnosis not present

## 2023-12-19 DIAGNOSIS — F339 Major depressive disorder, recurrent, unspecified: Secondary | ICD-10-CM | POA: Diagnosis not present

## 2023-12-21 ENCOUNTER — Encounter: Payer: Self-pay | Admitting: Genetic Counselor

## 2023-12-21 DIAGNOSIS — Z1379 Encounter for other screening for genetic and chromosomal anomalies: Secondary | ICD-10-CM | POA: Insufficient documentation

## 2023-12-21 NOTE — Telephone Encounter (Addendum)
 Medication Samples of Nubeqa  have been provided to the patient.  Drug name: Nubeqa  (darolutamide )         Strength: 300mg  tablets         Qty: 120 tablets (1 bottle)   LOT: [id number]    Exp.Date: 01/30/2025  Dosing instructions: Take 2 tablets (600mg ) by mouth twice daily with meals.  Patient resides in nursing home and daughter will bring in the samples of Nubeqa  to the center.  Patient Education I spoke with patient for overview of new oral chemotherapy medication: Nubeqa  (darolutamide ) for the treatment of metastatic castration resistant prostate cancer in conjunction with ADT, planned duration for Nubeqa  until disease progression or unacceptable drug toxicity.   Pt is doing well. Counseled patient on administration, dosing, side effects, monitoring, drug-food interactions, safe handling, storage, and disposal.   Patient will take Nubeqa  300 mg tablet, 2 tablets (600 mg total) by mouth 2 (two) times daily with a meal.   Patient knows to avoid grapefruit and grapefruit juice while on Nubeqa .   Start date: 12/21/2023   Side effects include but not limited to: changes in LFTs, HTN, fatigue.     Reviewed with patient importance of keeping a medication schedule and plan for any missed doses.  Distress thermometer not completed during telephone call as patient has been on previous lines of therapy. After discussion with patient no patient barriers to medication adherence identified.    Patient voiced understanding and appreciation. All questions answered. Medication handout provided.   Provided patient with Oral Chemotherapy Navigation Clinic phone number. Patient knows to call the office with questions or concerns.  Trammell Bowden M Jguadalupe Opiela 10:41 AM 12/18/2023

## 2023-12-22 DIAGNOSIS — C61 Malignant neoplasm of prostate: Secondary | ICD-10-CM | POA: Diagnosis not present

## 2023-12-22 DIAGNOSIS — R2689 Other abnormalities of gait and mobility: Secondary | ICD-10-CM | POA: Diagnosis not present

## 2023-12-22 DIAGNOSIS — M6281 Muscle weakness (generalized): Secondary | ICD-10-CM | POA: Diagnosis not present

## 2023-12-23 ENCOUNTER — Encounter: Admitting: Dietician

## 2023-12-24 ENCOUNTER — Ambulatory Visit
Admission: RE | Admit: 2023-12-24 | Discharge: 2023-12-24 | Disposition: A | Discharge status: Home / Self Care | Source: Ambulatory Visit | Attending: Radiation Oncology | Admitting: Radiation Oncology

## 2023-12-24 ENCOUNTER — Other Ambulatory Visit: Payer: Self-pay

## 2023-12-24 DIAGNOSIS — C61 Malignant neoplasm of prostate: Secondary | ICD-10-CM | POA: Diagnosis not present

## 2023-12-24 DIAGNOSIS — C7951 Secondary malignant neoplasm of bone: Secondary | ICD-10-CM | POA: Diagnosis not present

## 2023-12-24 DIAGNOSIS — Z510 Encounter for antineoplastic radiation therapy: Secondary | ICD-10-CM | POA: Diagnosis not present

## 2023-12-24 LAB — RAD ONC ARIA SESSION SUMMARY
Course Elapsed Days: 0
Plan Fractions Treated to Date: 1
Plan Prescribed Dose Per Fraction: 10 Gy
Plan Total Fractions Prescribed: 5
Plan Total Prescribed Dose: 50 Gy
Reference Point Dosage Given to Date: 10 Gy
Reference Point Session Dosage Given: 10 Gy
Session Number: 1

## 2023-12-24 NOTE — Progress Notes (Unsigned)
 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 GS 4+5 GG5 in 4 cores. PSA 150. PET with multifocal skeletal metastasis involving the pelvis, spine and LEFT ribs and a large RIGHT common iliac node. He was started on ADT in 01/2022 and AAP in 04/2022. PSA nadir in 10/2022 of 0.36 followed by rising PSA.    He met me in 11/2023. Repeat PET showed persistent radiotracer activity the posterior RIGHT acetabulum with associated bone sclerosis. Other bone lesions have resolved in the interval.  I referred him for treatment of oligometastasis.  He met with Dr. Patrcia and started SBRT.   At baseline he has right sided weakness. He is currently residing in nursing home requires assistance.  Also on a wheelchair.  His daughter came in later today.  We discussed his case, prognosis and treatment together. Report of multiple CVAs. Also had DM, HTN, PAD. Discussed he is higher risk given his medical history with prostate cancer treatment for MI, CVA, heart failure. Recommend palliative care consult for GOC and meeting with palliative care as well given his multiple medical comorbidities.  He is not a candidate for chemotherapy or therapy may lead to more side effects.  MCRPC is not curable.  He will eventually succumbed to the disease, or other medical conditions such as strokes, cardiovascular conditions.  He is  very concerned of his diet from nursing home and it's relation to his medications.  He repeated voice he wants to go home.  Daughter expressed that he is not able to care for himself, and due to his previously not taking his medication appropriately, it is not safe for him to go home.  He has disagreements in the room with his daughter regarding where he should live.  I let him know that I will consult social worker for assistance.  Current diagnosis: mCRPC Initial diagnosis: mHSPC Germline testing: Negative Somatic testing: Tempus. PIK3CA p.E542K missense variant (exon 9) 1.1 %. DNMT3A missense variant 29%. AR missense variant 3.4%. TMB 7.2. VUS of 7 genes. Treatment: ADT/AAP 02/28/22 ADT 04/2022 AAP 10/2022 PSA nadir 0.36. followed by rising PSA 04/2023 PSA 1.38 10/2023 PSA 3.2 T<10 11/04/23 PSA 10.7 T<10  Patient was able to obtain free drug and started darolutamide . Assessment & Plan Prostate cancer (HCC) Started SBRT for solitary metastasis Genetic testing neg somatic testing Changed to darolutamide . Continue ADT at [facility] Genetic testing Ambry CancerNext-Expanded Panel+RNA was Negative  History of CVA (cerebrovascular accident) On aspirin  and plavix  and statin Concerned about having social problem Referral to Child psychotherapist. Patient complained of facility and diet issue there. Goals of care, counseling/discussion Discussed prognosis today.  Discussed palliative care consult for ACP Palliative care referral was placed last time.  Will schedue  Orders Placed This Encounter  Procedures   CBC with Differential (Cancer Center Only)    Standing Status:   Future    Expiration Date:   12/24/2024   CMP (Cancer Center only)  Standing Status:   Future    Expiration Date:   12/24/2024   Testosterone     Standing Status:   Future    Expiration Date:   12/24/2024   Prostate-Specific AG, Serum    Standing Status:   Future    Expiration Date:   12/24/2024   Ambulatory referral to Social Work     Referral Priority:   Routine    Referral Type:   Consultation    Referral Reason:   Specialty Services Required    Number of Visits Requested:   1   I spent a total of 55 minutes including review of chart and various tests results, face-to-face time with the patient, his daughter in the room, discussions about results, prognosis, goals of care, plan of care and coordination of care plan with other providers and staff members.    Kyle JAYSON Chihuahua, MD  INTERVAL HISTORY: Patient returns for follow-up. His main complaint is the facility is not giving him healthy diet and he is very concerned. He is not sure if he started darolutamide , which we gave a sample to the daughter last week.  The daughter later arrived and we discussed together. He continues to complain about the facility. Daughter reports he is not safe to be by himself.  Oncology History  Prostate cancer (HCC)  11/2021 Initial Diagnosis   Prostate cancer (HCC) De novo with bone, LN metastases. PSA 150. GG5 4+5 in 4 cores. Rest were 4+3 lesions   02/26/2022 PET scan   PSMA PET 1. Intense activity throughout the prostate gland consistent with primary prostate adenocarcinoma. Prostate gland enlarged. Foley catheter in place. 2. Suspicion of local prostate cancer extension to the LEFT seminal vesicle. 3. Multifocal radiotracer avid skeletal metastasis involving the pelvis, spine and LEFT ribs. There are seven lesions identified. 4. Large RIGHT common iliac node with mild radiotracer activity is favored necrotic nodal metastasis. 5. Incidental finding of a RIGHT inguinal hernia which contains nonobstructed loop of small bowel.   10/09/2023 PET scan   PSMA PET 1. Persistent radiotracer activity the posterior RIGHT acetabulum with associated bone sclerosis. Other bone lesions have resolved in the interval. 2. Interval resolution of metastatic adenopathy in the pelvis. 3. No new adenopathy or visceral metastasis identified 4.  Foley catheter within collapsed bladder.      PHYSICAL EXAMINATION: ECOG PERFORMANCE STATUS: 3 - Symptomatic, >50% confined to bed  Vitals:   12/25/23 0908 12/25/23 0915  BP: (!) 177/49 (!) 188/49  Pulse: 62   Resp: 18   Temp: (!) 97 F (36.1 C)   SpO2: 100%    There were no vitals filed for this visit.  GENERAL: alert, no distress and comfortable on wheelchair LUNGS: normal breathing effort   Relevant data reviewed during this visit included labs, imaging.

## 2023-12-24 NOTE — Assessment & Plan Note (Addendum)
 Started SBRT for solitary metastasis Genetic testing neg somatic testing Changed to darolutamide . Continue ADT at [facility]

## 2023-12-24 NOTE — Assessment & Plan Note (Addendum)
Ambry CancerNext-Expanded Panel+RNA was Negative

## 2023-12-24 NOTE — Assessment & Plan Note (Addendum)
 On aspirin  and plavix  and statin

## 2023-12-25 ENCOUNTER — Inpatient Hospital Stay

## 2023-12-25 ENCOUNTER — Ambulatory Visit: Admitting: Radiation Oncology

## 2023-12-25 ENCOUNTER — Inpatient Hospital Stay: Admitting: Dietician

## 2023-12-25 VITALS — BP 188/49 | HR 62 | Temp 97.0°F | Resp 18

## 2023-12-25 DIAGNOSIS — Z510 Encounter for antineoplastic radiation therapy: Secondary | ICD-10-CM | POA: Diagnosis not present

## 2023-12-25 DIAGNOSIS — Z1379 Encounter for other screening for genetic and chromosomal anomalies: Secondary | ICD-10-CM

## 2023-12-25 DIAGNOSIS — E1122 Type 2 diabetes mellitus with diabetic chronic kidney disease: Secondary | ICD-10-CM | POA: Diagnosis not present

## 2023-12-25 DIAGNOSIS — E785 Hyperlipidemia, unspecified: Secondary | ICD-10-CM | POA: Diagnosis not present

## 2023-12-25 DIAGNOSIS — F32A Depression, unspecified: Secondary | ICD-10-CM | POA: Diagnosis not present

## 2023-12-25 DIAGNOSIS — Z7189 Other specified counseling: Secondary | ICD-10-CM

## 2023-12-25 DIAGNOSIS — I130 Hypertensive heart and chronic kidney disease with heart failure and stage 1 through stage 4 chronic kidney disease, or unspecified chronic kidney disease: Secondary | ICD-10-CM | POA: Diagnosis not present

## 2023-12-25 DIAGNOSIS — C61 Malignant neoplasm of prostate: Secondary | ICD-10-CM | POA: Diagnosis not present

## 2023-12-25 DIAGNOSIS — M6281 Muscle weakness (generalized): Secondary | ICD-10-CM | POA: Diagnosis not present

## 2023-12-25 DIAGNOSIS — R200 Anesthesia of skin: Secondary | ICD-10-CM | POA: Diagnosis not present

## 2023-12-25 DIAGNOSIS — Z659 Problem related to unspecified psychosocial circumstances: Secondary | ICD-10-CM

## 2023-12-25 DIAGNOSIS — I69398 Other sequelae of cerebral infarction: Secondary | ICD-10-CM | POA: Diagnosis not present

## 2023-12-25 DIAGNOSIS — Z8673 Personal history of transient ischemic attack (TIA), and cerebral infarction without residual deficits: Secondary | ICD-10-CM | POA: Diagnosis not present

## 2023-12-25 DIAGNOSIS — N182 Chronic kidney disease, stage 2 (mild): Secondary | ICD-10-CM | POA: Diagnosis not present

## 2023-12-25 DIAGNOSIS — C7951 Secondary malignant neoplasm of bone: Secondary | ICD-10-CM | POA: Diagnosis not present

## 2023-12-25 DIAGNOSIS — I509 Heart failure, unspecified: Secondary | ICD-10-CM | POA: Diagnosis not present

## 2023-12-25 DIAGNOSIS — R2689 Other abnormalities of gait and mobility: Secondary | ICD-10-CM | POA: Diagnosis not present

## 2023-12-25 DIAGNOSIS — Z993 Dependence on wheelchair: Secondary | ICD-10-CM | POA: Diagnosis not present

## 2023-12-25 NOTE — Progress Notes (Signed)
 Nutrition Assessment   Reason for Assessment: Referral    ASSESSMENT: 73 year old male with metastatic prostate cancer metastatic to lymph and bone. He is receiving palliative darolutamide  + SBRT to pelvis. Patient is under the care of Dr. Tina.   Past medical history includes HTN, PAD, CVA, dCHF, IDDM2, spastic hemiparesis, CKD2, hydronephrosis, HLD, dysarthria s/p CVA, Foley  Patient arrived 25 minutes late from MD office. He is in a wheelchair and joined by his daughter today. Patient is currently living at [facility] (SNF). He is complaining about meals/snacks received at facility. Says they do not give him protein which he is in need of. They also serve sweets for snacks which he can not have due to DM. Patient reports he is not offered meal/snack choices. States they serve the plop menu meaning you eat what is plopped on the plate or not at all. Recalls oatmeal with eggs (2 hard boiled or scrambled) for breakfast. Had vegetable plate, corn/bean salad and cole slaw for lunch. Ate grilled cheese and tomato soup for dinner. Daughter has been bringing preferred snacks for patient (fried chicken, peanut butter, honey). Daughter states facility calls her too much (r/t hyperglycemia and falls). She is tired and does not want to be called all the time.   Nutrition Focused Physical Exam: deferred    Medications: azor , lipitor, coreg , plavix , nubeqa , novolog , semglee , jardiance , metformin , protonix , zoloft , flomax    Labs: 6/23 - glucose 164   Anthropometrics:   Height: 5'7 Weight: 204 lb (6/23) UBW: 200-208 lb (last 12 months) BMI: 31.95    NUTRITION DIAGNOSIS: Food and nutrition related knowledge deficit related to cancer as evidenced by no prior need for associated nutrition information   INTERVENTION:  Educated on foods with protein - discussed eggs/beans/cheese are sources of protein (suspect pt on HH/DM diet - likely not receiving foods such as bacon/sausage/hot  dogs) Educated on balanced snacks (peanut butter/apple slices, cottage cheese with fruit) - blood glucose management handout with ideas provided Suggested patient request glucerna in between meals - coupons provided to daughter Pt and daughter to meet with LCSW to discuss current living arrangements as pt would like to return home  MONITORING, EVALUATION, GOAL: Patient will tolerate adequate calories and protein to maintain weights/strength    Next Visit: No follow-up scheduled. Encouraged to call with nutrition questions/concerns

## 2023-12-25 NOTE — Progress Notes (Signed)
 [facility] Clinical Social Work  Initial Assessment   [name] is a 73 y.o. year old male accompanied by patient and daughter. Clinical Social Work was referred by medical provider for assessment of psychosocial needs.   SDOH (Social Determinants of Health) assessments performed: No SDOH Interventions    Flowsheet Row Office Visit from 11/23/2023 in [facility] Dept Of [place] Care Coordination from 03/25/2023 in [facility] Care Coordination Office Visit from 01/02/2023 in [facility] Of [place]  SDOH Interventions     Food Insecurity Interventions Intervention Not Indicated Other (Comment)  [needs easy to prepare meals,  on wait list for meals on wheels] AMB Referral  Housing Interventions Intervention Not Indicated Intervention Not Indicated --  Transportation Interventions Intervention Not Indicated Other (Comment)  [Daughter working on SCAT] --  Utilities Interventions Intervention Not Indicated Intervention Not Indicated --    SDOH Screenings   Food Insecurity: No Food Insecurity (12/08/2023)  Housing: Unknown (12/08/2023)  Transportation Needs: No Transportation Needs (12/08/2023)  Utilities: Not At Risk (12/08/2023)  Alcohol Screen: Low Risk  (12/08/2023)  Depression (PHQ2-9): Low Risk  (12/25/2023)  Social Connections: Unknown (10/10/2023)  Tobacco Use: Low Risk  (12/08/2023)     Distress Screen completed: No     No data to display            Family/Social Information:  Housing Arrangement: Patient lives at [facility] facility. Patient has resided there for the past three months. Patient has a desire to return back to his home due to conditions at the facility. Family members/support persons in your life? Patient's daughter appears to attend most medical appointments. However, it appears daughter and patient have periods of conflict.  Transportation concerns:  Patient is transported back to / from appointments by facility   Employment: Legally disabled Patient does not work at this time..  Income source: Supplemental Security Income Financial concerns: Daughter expressed, patient's frustration with limited income due to cost to stay at facility.  Type of concern: Patient is able to meet basic needs. Food access concerns: no Religious or spiritual practice: Not known Advanced directives: Patient has an appointment with Palliative Care Provider. CSW provided education on HCPOA. At time of visit, the discussion of who should be in this role, appeared to be a source of tension between daughter and patient.  Coping/ Adjustment to diagnosis: Patient understands treatment plan and what happens next? yes Concerns about diagnosis and/or treatment: I'm not especially worried about anything Patient reported stressors: Facility Living  Current coping skills/ strengths: Ability for insight , Active sense of humor , Average or above average intelligence , General fund of knowledge , and Motivation for treatment/growth     SUMMARY: Current SDOH Barriers:  Patient expressed general dissatisfaction with stay at facility. CSW provided emotional and verbal support for patient. Patient's daughter appeared to be overwhelmed with care giving responsibilities.   Clinical Social Work Clinical Goal(s):  No clinical social work goals at this time  Interventions: Discussed common feeling and emotions when being diagnosed with cancer, and the importance of support during treatment Informed patient of the support team roles and support services at [facility] Provided CSW contact information and encouraged patient to call with any questions or concerns   Follow Up Plan: Patient will contact CSW with any support or resource needs  Lizbeth Sprague, LCSW Clinical Social Worker [facility]

## 2023-12-25 NOTE — Assessment & Plan Note (Addendum)
 Discussed prognosis today.  Discussed palliative care consult for ACP Palliative care referral was placed last time.  Will schedue

## 2023-12-26 DIAGNOSIS — R2689 Other abnormalities of gait and mobility: Secondary | ICD-10-CM | POA: Diagnosis not present

## 2023-12-26 DIAGNOSIS — M6281 Muscle weakness (generalized): Secondary | ICD-10-CM | POA: Diagnosis not present

## 2023-12-26 DIAGNOSIS — C61 Malignant neoplasm of prostate: Secondary | ICD-10-CM | POA: Diagnosis not present

## 2023-12-28 ENCOUNTER — Ambulatory Visit
Admission: RE | Admit: 2023-12-28 | Discharge: 2023-12-28 | Disposition: A | Discharge status: Home / Self Care | Source: Ambulatory Visit | Attending: Radiation Oncology | Admitting: Radiation Oncology

## 2023-12-28 ENCOUNTER — Other Ambulatory Visit: Payer: Self-pay

## 2023-12-28 DIAGNOSIS — C7951 Secondary malignant neoplasm of bone: Secondary | ICD-10-CM | POA: Diagnosis not present

## 2023-12-28 DIAGNOSIS — C61 Malignant neoplasm of prostate: Secondary | ICD-10-CM

## 2023-12-28 DIAGNOSIS — Z510 Encounter for antineoplastic radiation therapy: Secondary | ICD-10-CM | POA: Diagnosis not present

## 2023-12-28 LAB — RAD ONC ARIA SESSION SUMMARY
Course Elapsed Days: 4
Plan Fractions Treated to Date: 2
Plan Prescribed Dose Per Fraction: 10 Gy
Plan Total Fractions Prescribed: 5
Plan Total Prescribed Dose: 50 Gy
Reference Point Dosage Given to Date: 20 Gy
Reference Point Session Dosage Given: 10 Gy
Session Number: 2

## 2023-12-29 ENCOUNTER — Telehealth: Payer: Self-pay

## 2023-12-29 ENCOUNTER — Ambulatory Visit: Admitting: Radiation Oncology

## 2023-12-29 DIAGNOSIS — I69320 Aphasia following cerebral infarction: Secondary | ICD-10-CM | POA: Diagnosis not present

## 2023-12-29 DIAGNOSIS — N183 Chronic kidney disease, stage 3 unspecified: Secondary | ICD-10-CM | POA: Diagnosis not present

## 2023-12-29 DIAGNOSIS — R1311 Dysphagia, oral phase: Secondary | ICD-10-CM | POA: Diagnosis not present

## 2023-12-29 DIAGNOSIS — F32A Depression, unspecified: Secondary | ICD-10-CM | POA: Diagnosis not present

## 2023-12-29 DIAGNOSIS — E118 Type 2 diabetes mellitus with unspecified complications: Secondary | ICD-10-CM | POA: Diagnosis not present

## 2023-12-29 DIAGNOSIS — I739 Peripheral vascular disease, unspecified: Secondary | ICD-10-CM | POA: Diagnosis not present

## 2023-12-29 DIAGNOSIS — I6930 Unspecified sequelae of cerebral infarction: Secondary | ICD-10-CM | POA: Diagnosis not present

## 2023-12-29 DIAGNOSIS — N139 Obstructive and reflux uropathy, unspecified: Secondary | ICD-10-CM | POA: Diagnosis not present

## 2023-12-29 DIAGNOSIS — C61 Malignant neoplasm of prostate: Secondary | ICD-10-CM | POA: Diagnosis not present

## 2023-12-29 DIAGNOSIS — F331 Major depressive disorder, recurrent, moderate: Secondary | ICD-10-CM | POA: Diagnosis not present

## 2023-12-29 DIAGNOSIS — I10 Essential (primary) hypertension: Secondary | ICD-10-CM | POA: Diagnosis not present

## 2023-12-29 DIAGNOSIS — M25461 Effusion, right knee: Secondary | ICD-10-CM | POA: Diagnosis not present

## 2023-12-29 DIAGNOSIS — G8191 Hemiplegia, unspecified affecting right dominant side: Secondary | ICD-10-CM | POA: Diagnosis not present

## 2023-12-29 DIAGNOSIS — M6281 Muscle weakness (generalized): Secondary | ICD-10-CM | POA: Diagnosis not present

## 2023-12-29 DIAGNOSIS — K219 Gastro-esophageal reflux disease without esophagitis: Secondary | ICD-10-CM | POA: Diagnosis not present

## 2023-12-29 DIAGNOSIS — R2689 Other abnormalities of gait and mobility: Secondary | ICD-10-CM | POA: Diagnosis not present

## 2023-12-29 DIAGNOSIS — E0822 Diabetes mellitus due to underlying condition with diabetic chronic kidney disease: Secondary | ICD-10-CM | POA: Diagnosis not present

## 2023-12-29 NOTE — Telephone Encounter (Signed)
 Scheduled appointments with [facility] and asked that they call back to confirm.

## 2023-12-30 ENCOUNTER — Ambulatory Visit
Admission: RE | Admit: 2023-12-30 | Discharge: 2023-12-30 | Disposition: A | Discharge status: Home / Self Care | Source: Ambulatory Visit | Attending: Radiation Oncology | Admitting: Radiation Oncology

## 2023-12-30 ENCOUNTER — Other Ambulatory Visit: Payer: Self-pay

## 2023-12-30 DIAGNOSIS — C61 Malignant neoplasm of prostate: Secondary | ICD-10-CM

## 2023-12-30 DIAGNOSIS — C7951 Secondary malignant neoplasm of bone: Secondary | ICD-10-CM | POA: Diagnosis not present

## 2023-12-30 DIAGNOSIS — Z510 Encounter for antineoplastic radiation therapy: Secondary | ICD-10-CM | POA: Diagnosis not present

## 2023-12-30 LAB — RAD ONC ARIA SESSION SUMMARY
Course Elapsed Days: 6
Plan Fractions Treated to Date: 3
Plan Prescribed Dose Per Fraction: 10 Gy
Plan Total Fractions Prescribed: 5
Plan Total Prescribed Dose: 50 Gy
Reference Point Dosage Given to Date: 30 Gy
Reference Point Session Dosage Given: 10 Gy
Session Number: 3

## 2023-12-30 NOTE — Telephone Encounter (Signed)
 Patient has received Nubeqa  samples for 30 days while daughter obtains income documents for PAP.  Patient Advocate [name] will continue to follow up.

## 2024-01-01 ENCOUNTER — Ambulatory Visit: Admitting: Radiation Oncology

## 2024-01-01 DIAGNOSIS — R2689 Other abnormalities of gait and mobility: Secondary | ICD-10-CM | POA: Diagnosis not present

## 2024-01-01 DIAGNOSIS — M6281 Muscle weakness (generalized): Secondary | ICD-10-CM | POA: Diagnosis not present

## 2024-01-01 DIAGNOSIS — C61 Malignant neoplasm of prostate: Secondary | ICD-10-CM | POA: Diagnosis not present

## 2024-01-04 ENCOUNTER — Ambulatory Visit
Admission: RE | Admit: 2024-01-04 | Discharge: 2024-01-04 | Disposition: A | Discharge status: Home / Self Care | Source: Ambulatory Visit | Attending: Radiation Oncology | Admitting: Radiation Oncology

## 2024-01-04 ENCOUNTER — Inpatient Hospital Stay: Admitting: Nurse Practitioner

## 2024-01-04 ENCOUNTER — Encounter: Payer: Self-pay | Admitting: Nurse Practitioner

## 2024-01-04 ENCOUNTER — Other Ambulatory Visit: Payer: Self-pay

## 2024-01-04 VITALS — BP 176/64 | HR 57 | Temp 97.5°F | Ht 67.0 in

## 2024-01-04 DIAGNOSIS — Z7189 Other specified counseling: Secondary | ICD-10-CM | POA: Diagnosis not present

## 2024-01-04 DIAGNOSIS — Z8673 Personal history of transient ischemic attack (TIA), and cerebral infarction without residual deficits: Secondary | ICD-10-CM | POA: Insufficient documentation

## 2024-01-04 DIAGNOSIS — Z515 Encounter for palliative care: Secondary | ICD-10-CM | POA: Diagnosis not present

## 2024-01-04 DIAGNOSIS — C61 Malignant neoplasm of prostate: Secondary | ICD-10-CM | POA: Insufficient documentation

## 2024-01-04 DIAGNOSIS — Z7902 Long term (current) use of antithrombotics/antiplatelets: Secondary | ICD-10-CM | POA: Insufficient documentation

## 2024-01-04 DIAGNOSIS — R530 Neoplastic (malignant) related fatigue: Secondary | ICD-10-CM | POA: Diagnosis not present

## 2024-01-04 DIAGNOSIS — R2689 Other abnormalities of gait and mobility: Secondary | ICD-10-CM | POA: Diagnosis not present

## 2024-01-04 DIAGNOSIS — C779 Secondary and unspecified malignant neoplasm of lymph node, unspecified: Secondary | ICD-10-CM | POA: Insufficient documentation

## 2024-01-04 DIAGNOSIS — M6281 Muscle weakness (generalized): Secondary | ICD-10-CM | POA: Diagnosis not present

## 2024-01-04 DIAGNOSIS — Z79899 Other long term (current) drug therapy: Secondary | ICD-10-CM | POA: Insufficient documentation

## 2024-01-04 DIAGNOSIS — E785 Hyperlipidemia, unspecified: Secondary | ICD-10-CM | POA: Insufficient documentation

## 2024-01-04 DIAGNOSIS — C7951 Secondary malignant neoplasm of bone: Secondary | ICD-10-CM | POA: Insufficient documentation

## 2024-01-04 DIAGNOSIS — I10 Essential (primary) hypertension: Secondary | ICD-10-CM | POA: Insufficient documentation

## 2024-01-04 DIAGNOSIS — Z7982 Long term (current) use of aspirin: Secondary | ICD-10-CM | POA: Insufficient documentation

## 2024-01-04 LAB — RAD ONC ARIA SESSION SUMMARY
Course Elapsed Days: 11
Plan Fractions Treated to Date: 4
Plan Prescribed Dose Per Fraction: 10 Gy
Plan Total Fractions Prescribed: 5
Plan Total Prescribed Dose: 50 Gy
Reference Point Dosage Given to Date: 40 Gy
Reference Point Session Dosage Given: 10 Gy
Session Number: 4

## 2024-01-04 NOTE — Progress Notes (Signed)
 Palliative Medicine [facility]  [phone] [phone]   Name: [name] Date: 01/04/2024 MRN: [id number]  DOB: [birth year]  Patient Care Team: Adele Song, MD as PCP - General Verlin Lonni BIRCH, MD as PCP - Cardiology (Cardiology) Verlin Lonni BIRCH, MD (Cardiology) Vertell Pont, RN as Oncology Nurse Navigator Pickenpack-Cousar, Fannie SAILOR, NP as Nurse Practitioner (Hospice and Palliative Medicine) Silvano Valrie SQUIBB, RN as Registered Nurse    INTERVAL HISTORY: [name] is a 73 y.o. 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 Medical problems include history of CVA with residual right-sided numbness, dysarthria, dysphagia, type 2 diabetes, hypertension, PVD, CHF, HLD, depression, and stage 2 CKD.   SOCIAL HISTORY:     reports that he has never smoked. He has never used smokeless tobacco. He reports that he does not drink alcohol and does not use drugs.  ADVANCE DIRECTIVES:  Currently full code, no MOST/HCPOA established, discussed DNR/DNI, will plan to follow up at next appt after he can discuss this with his children   CODE STATUS: Full code  PAST MEDICAL HISTORY: Past Medical History:  Diagnosis Date   CVA (cerebral infarction) 01/03/2008   MRI: Acute 1 x 1.5 cm infarction affecting the left side of the pons.   Diabetes mellitus    DVT (deep venous thrombosis) (HCC)    Hypertension    PAD (peripheral artery disease) (HCC)    Prostate cancer (HCC)    Stroke (HCC)    2008    ALLERGIES:  is allergic to beef-derived drug products, pork allergy, and shrimp flavor agent (non-screening).  MEDICATIONS:  Current Outpatient Medications  Medication Sig Dispense Refill   acetaminophen  (TYLENOL ) 325 MG tablet Take 2 tablets (650 mg total) by mouth every 6 (six) hours as needed for mild pain (pain score 1-3) (or Fever >/= 101).     ADMELOG SOLOSTAR 100 UNIT/ML KwikPen Inject into the skin.      amlodipine -olmesartan  (AZOR ) 10-20 MG tablet Take 1 tablet by mouth daily. PLEASE SCHEDULE APPT PRIOR TO NEXT REFILL 90 tablet 0   aspirin  EC 81 MG tablet Take 1 tablet (81 mg total) by mouth daily. Swallow whole. 30 tablet 12   atorvastatin  (LIPITOR) 40 MG tablet Take 1 tablet (40 mg total) by mouth daily. 90 tablet 3   carvedilol  (COREG ) 12.5 MG tablet Take 1 tablet (12.5 mg total) by mouth 2 (two) times daily with a meal. 180 tablet 3   clopidogrel  (PLAVIX ) 75 MG tablet Take 1 tablet (75 mg total) by mouth daily. 90 tablet 2   darolutamide  (NUBEQA ) 300 MG tablet Take 2 tablets (600 mg total) by mouth 2 (two) times daily with a meal. 120 tablet 11   insulin  aspart (NOVOLOG ) 100 UNIT/ML injection Inject 0-5 Units into the skin at bedtime.     insulin  aspart (NOVOLOG ) 100 UNIT/ML injection Inject 0-6 Units into the skin 3 (three) times daily with meals.     insulin  glargine-yfgn (SEMGLEE ) 100 UNIT/ML injection Inject 0.12 mLs (12 Units total) into the skin daily.     JARDIANCE  25 MG TABS tablet Take 25 mg by mouth daily.     metFORMIN  (GLUCOPHAGE ) 1000 MG tablet Take 1 tablet (1,000 mg total) by mouth 2 (two) times daily with a meal. 180 tablet 3   Nutritional Supplements (GLUCERNA 1.5 CAL/CARBSTEADY) LIQD Take 237 mLs by mouth 2 (two) times daily. 14220 mL 12   pantoprazole  (PROTONIX ) 20 MG tablet TAKE 1 TABLET BY MOUTH  EVERY DAY 90 tablet 1   sertraline  (ZOLOFT ) 100 MG tablet TAKE 1 TABLET BY MOUTH EVERY DAY 90 tablet 2   tamsulosin  (FLOMAX ) 0.4 MG CAPS capsule Take 0.4 mg by mouth daily. (Patient not taking: Reported on 12/08/2023)     No current facility-administered medications for this visit.    VITAL SIGNS: BP (!) 176/64 (BP Location: Left Arm, Patient Position: Sitting, Cuff Size: Normal)   Pulse (!) 57   Temp (!) 97.5 F (36.4 C) (Temporal)   Ht 5' 7 (1.702 m)   SpO2 99%   BMI 31.95 kg/m  There were no vitals filed for this visit.  Estimated body mass index is 31.95 kg/m as  calculated from the following:   Height as of this encounter: 5' 7 (1.702 m).   Weight as of 11/23/23: 204 lb (92.5 kg).   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  History of Present Illness [name] is a 73 year old male who presents to clinic for a follow-up. He is accompanied by his daughter. Patient reports he is doing well overall. Remains at SNF. Is hopeful to return home at some poing. Denies concerns for nausea, vomiting, constipation.  Patient reports occasional diarrhea and has been given Imodium in the past, though he is unsure about its current use.   He continues to express concerns with lack of sleep. His sleep is disrupted due to having a roommate, and this has not improved since the last visit. States roommate keeps the TV on constantly at large volumes.   [name] denies any uncontrolled symptoms at this time. Continues to take things one day at a time. Reports his final radiation treatment is this week and he is looking forward to this.   Goals of Care We discussed his wishes. Patient continues to request time to consider who he would like to be listed as his medical decision-maker.  We discussed at length with his daughter today who is familiar with the process.  Patient states most likely she would be his medical decision-maker however again wishes to discuss with his entire family before putting anything in writing.  He verbalizes the importance of completing such document expressing wishes to follow-up with further discussions in the near  future.  All questions answered and support provided  7/8: Extensive goals of care discussion completed.  Patient indicates his health is ok considering his limited access to healthy food and difficulty sleeping.  He seems to have a fair understanding of his health with some limited insight into his current condition.  He gives consent for an open on discussion of his current medical problems.  Therefore, we discussed his castrate resistant metastatic prostate cancer and prognosis as well as that his treatment is now palliative vs curative and what that means. We also discussed his metastatic prostate and what it means in the larger context of his on-going co-morbidities (ie CVA, impaired functional status, T2DM, vascular disease, HTN, etc). Natural disease trajectory and expectations were discussed.  He verbalized understanding is thankful for the discussion regarding his health condition.  We discussed that while we hope the  chemo will be effective at controlling his cancer and limiting its spread, we are concerned about the possibility of his health worsening and worsening rapidly.  Therefore, discussed advanced care planning for things he would want or not want if his health were to worsen.  This discussion includes what quality of life means for him.  He indicates that quality of life means being able to either maintain or improve his current level of functioning. He hopes to return home. He would not want to be bedbound and dependent on others for all ADLs.  He desires to be able to be aware/cognizant enough to have conversations and spend meaningful time with family.  We discussed given his current cancer diagnosis as well as his other comorbidities, it would be appropriate for him to consider DNR/DNI.  We also discussed the limitations of CPR in older adults with advanced illness and with similar co morbidities.  He states this makes sense and would like to pursue DNR/DNI however, he feels he needs to  discuss this transition with his children.  He has never had discussions regarding his goals and wishes were his health to acutely worsen with his children but plans to do so soon.  We also discussed the utility of having a healthcare power of attorney.  He states that he will speak to his children and determine who he would want to be in HCPOA.  Educated that completing HCPOA documentation and something we can assist with at the cancer center.  He is grateful for this and will let us  know when he decides on HCPOA.  We reviewed MOST form and extensive education was provided with each section.  He indicates at this time leaning towards DNR/DNI, limited interventions, okay for hospitalization, noninvasive airway support, IV fluids, antibiotics, and feeding tube trial but again wishes to speak to his family first.  He was provided with a copy of a MOST form as well as a copy of the hard choices for loving people booklet.  All questions were answered.   Assessment & Plan Diarrhea Reports diarrhea without constipation. Imodium was previously prescribed but not used. Discussion about using Imodium to manage diarrhea without affecting bowel capacity. - Encourage use of Imodium as needed for diarrhea management.  Nutritional support needs Reports receiving one egg, one biscuit, and oatmeal for breakfast. No Ensure or Boost currently provided. Previous order for nutritional supplements was placed but not yet received. Discussion about ensuring nutritional support through supplements like Ensure or Boost. - Ensure provision of Ensure or Boost from pharmacy as previously ordered.  Goals of care discussion: Extensive GOC discussion completed. Code status: Full code for the time being.  He is considering transitioning to: DNR/DNI, continue current chemo/cancer therapies and treat the treatable.  Okay with hospitalization.  However, wishes to discuss with his children before making any changes to code  status/completing paperwork. Will speak with children and determine who would be an appropriate healthcare power of attorney and follow back up with us  to assist with paperwork. Encouraged him speak with his children regarding his GOC and discuss DNR/DNI and encourage his children to attend the next appointment if they have questions to ensure they are fully aware of his goals and wishes Palliative team to continue to follow for ongoing goals of care discussion  I will plan to see patient back in 3-4 weeks.  Sooner if needed.  Patient expressed understanding and was in agreement with this plan. He also understands that He can call the clinic  at any time with any questions, concerns, or complaints.   Any controlled substances utilized were prescribed in the context of palliative care. PDMP has been reviewed.   Visit consisted of counseling and education dealing with the complex and emotionally intense issues of symptom management and palliative care in the setting of serious and potentially life-threatening illness.  Levon Borer, AGPCNP-BC  Palliative Medicine Team/[facility]

## 2024-01-05 ENCOUNTER — Telehealth: Payer: Self-pay

## 2024-01-06 ENCOUNTER — Telehealth: Payer: Self-pay | Admitting: *Deleted

## 2024-01-06 ENCOUNTER — Ambulatory Visit

## 2024-01-06 ENCOUNTER — Ambulatory Visit
Admission: RE | Admit: 2024-01-06 | Discharge: 2024-01-06 | Disposition: A | Discharge status: Home / Self Care | Source: Ambulatory Visit | Attending: Radiation Oncology | Admitting: Radiation Oncology

## 2024-01-06 ENCOUNTER — Other Ambulatory Visit: Payer: Self-pay

## 2024-01-06 DIAGNOSIS — R338 Other retention of urine: Secondary | ICD-10-CM | POA: Diagnosis not present

## 2024-01-06 DIAGNOSIS — M6281 Muscle weakness (generalized): Secondary | ICD-10-CM | POA: Diagnosis not present

## 2024-01-06 DIAGNOSIS — Z510 Encounter for antineoplastic radiation therapy: Secondary | ICD-10-CM | POA: Diagnosis not present

## 2024-01-06 DIAGNOSIS — C7951 Secondary malignant neoplasm of bone: Secondary | ICD-10-CM | POA: Diagnosis not present

## 2024-01-06 DIAGNOSIS — R2689 Other abnormalities of gait and mobility: Secondary | ICD-10-CM | POA: Diagnosis not present

## 2024-01-06 DIAGNOSIS — C61 Malignant neoplasm of prostate: Secondary | ICD-10-CM | POA: Diagnosis not present

## 2024-01-06 LAB — RAD ONC ARIA SESSION SUMMARY
Course Elapsed Days: 13
Plan Fractions Treated to Date: 5
Plan Prescribed Dose Per Fraction: 10 Gy
Plan Total Fractions Prescribed: 5
Plan Total Prescribed Dose: 50 Gy
Reference Point Dosage Given to Date: 50 Gy
Reference Point Session Dosage Given: 10 Gy
Session Number: 5

## 2024-01-06 NOTE — Telephone Encounter (Signed)
 Patient's daughter left a message with collaborative.  Received Secure Chat to return call. Called Sophona [phone]) to inquire if any further questions or needs. Unable to help.   I am trying to return a call to a Nathanel and/or Lucie since I am at the [facility].  Received a message from them a few days ago.  They are navigators that work for the doctor.  I do not know their work department or phone numbers.  I will check the messages when I get home.  [name]

## 2024-01-06 NOTE — Telephone Encounter (Signed)
 Aashir E Crampton's daughter Sophona in this nurse office with FMLA paperwork.  Advised patient must sign authorization to use his PHI to process forms for a third party as I reviewed forms process.  I'll bring it all back.   Placed [id number] paperwork in 9 x12 envelope.  Provided HIPAA authorization and [facility] cover sheet for Sophona.  Requested a new LOA form that will fax clearer.  Current form very crumpled due to being folded in pants pocket.  Currently, no further questions or needs.

## 2024-01-07 ENCOUNTER — Ambulatory Visit: Attending: Vascular Surgery | Admitting: Physician Assistant

## 2024-01-07 ENCOUNTER — Ambulatory Visit (HOSPITAL_COMMUNITY)
Admission: RE | Admit: 2024-01-07 | Discharge: 2024-01-07 | Disposition: A | Discharge status: Home / Self Care | Source: Ambulatory Visit | Attending: Vascular Surgery | Admitting: Vascular Surgery

## 2024-01-07 ENCOUNTER — Ambulatory Visit (HOSPITAL_BASED_OUTPATIENT_CLINIC_OR_DEPARTMENT_OTHER)
Admission: RE | Admit: 2024-01-07 | Discharge: 2024-01-07 | Disposition: A | Discharge status: Home / Self Care | Source: Ambulatory Visit | Attending: Vascular Surgery | Admitting: Vascular Surgery

## 2024-01-07 VITALS — BP 126/68 | HR 73 | Temp 99.3°F | Wt 204.0 lb

## 2024-01-07 DIAGNOSIS — F339 Major depressive disorder, recurrent, unspecified: Secondary | ICD-10-CM | POA: Diagnosis not present

## 2024-01-07 DIAGNOSIS — I739 Peripheral vascular disease, unspecified: Secondary | ICD-10-CM

## 2024-01-07 DIAGNOSIS — G4700 Insomnia, unspecified: Secondary | ICD-10-CM | POA: Diagnosis not present

## 2024-01-07 DIAGNOSIS — R2689 Other abnormalities of gait and mobility: Secondary | ICD-10-CM | POA: Diagnosis not present

## 2024-01-07 DIAGNOSIS — C61 Malignant neoplasm of prostate: Secondary | ICD-10-CM | POA: Diagnosis not present

## 2024-01-07 DIAGNOSIS — M6281 Muscle weakness (generalized): Secondary | ICD-10-CM | POA: Diagnosis not present

## 2024-01-07 LAB — VAS US ABI WITH/WO TBI: Right ABI: 1.06

## 2024-01-07 NOTE — Telephone Encounter (Signed)
 Oral Oncology Patient Advocate Encounter  I got in touch with [name] today, and she is going to email me a copy of his Social Security income verification so we can proceed with getting his medication.  [name], CPhT [place]  [facility] Pharmacy Technician Patient Advocate Specialist II [facility] [place] Phone: [phone]  Fax: [phone] [email or web address]

## 2024-01-07 NOTE — Progress Notes (Signed)
 Patient is established with a treatment plan and is actively engaged in care. Nurse Navigator services not currently indicated at this time. Will re-evaluate if needs change or if additional support is requested.

## 2024-01-07 NOTE — Telephone Encounter (Signed)
 Oral Oncology Patient Advocate Encounter  I reached out to [name] (daughter) and left a message about income documents that we need in order to complete patient assistance. Cassie has spoken with her a few times, [name] has been trying to find his social security paperwork.  [name], CPhT [place]  [facility] Pharmacy Technician Patient Advocate Specialist II [facility] [place] Phone: [phone]  Fax: [phone] [email or web address]

## 2024-01-07 NOTE — Radiation Completion Notes (Addendum)
  Radiation Oncology         [phone] ________________________________  Name: [name] MRN: [id number]  Date: 01/06/2024  DOB: 02/15/1951  Referring Physician: PAULETTA CHIHUAHUA, M.D. Date of Service: 2024-01-07 Radiation Oncologist: Adina Barge, M.D. 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   Intent: Curative     ==========DELIVERED PLANS==========  First Treatment Date: 2023-12-24 Last Treatment Date: 2024-01-06   Plan Name: Pelvis_SBRT Site: [place] Technique: SBRT/SRT-IMRT Mode: Photon Dose Per Fraction: 10 Gy Prescribed Dose (Delivered / Prescribed): 50 Gy / 50 Gy Prescribed Fxs (Delivered / Prescribed): 5 / 5     ==========ON TREATMENT VISIT DATES========== 2023-12-24, 2023-12-28, 2023-12-30, 2024-01-04, 2024-01-04, 2024-01-06   See weekly On Treatment Notes in Epic for details in the Media tab (listed as Progress notes on the On Treatment Visit Dates listed above).   The patient will receive a call in about one month from the radiation oncology department. He will continue follow up with his medical oncologist, Dr. CHIHUAHUA, as well.  ------------------------------------------------   Donnice Barge, MD [facility]  Radiation Oncology Direct Dial: [phone]  Fax: [phone] [email or web address]  Skype  LinkedIn

## 2024-01-07 NOTE — Progress Notes (Signed)
 Office Note   History of Present Illness   [name] is a 73 y.o. (11/24/1950) male who presents for follow-up.  He recently underwent right lower extremity angiogram with right SFA stenting, SFA drug-coated balloon angioplasty, and anterior tibial artery balloon angioplasty on 08/19/2023 by Dr. Lanis.  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  He is mostly nonambulatory but can stand and pivot with assistance.   At his last visit with our office his right leg wound was healing appropriately.  He had no pain in his right foot.  His right SFA stent was also patent.  He returns today for follow-up.  He is doing okay at today's visit.  He says that his right lower extremity wounds have healed.  He denies any further tissue loss.  He denies any rest pain.  He says that he walks a little bit with physical therapy but his legs feel very weak.  He denies any pain when walking.  Current Outpatient Medications  Medication Sig Dispense Refill   acetaminophen  (TYLENOL ) 325 MG tablet Take 2 tablets (650 mg total) by mouth every 6 (six) hours as needed for mild pain (pain score 1-3) (or Fever >/= 101).     ADMELOG SOLOSTAR 100 UNIT/ML KwikPen Inject into the skin.     amlodipine -olmesartan  (AZOR ) 10-20 MG tablet Take 1 tablet by mouth daily. PLEASE SCHEDULE APPT PRIOR TO NEXT REFILL 90 tablet 0   aspirin  EC 81 MG tablet Take 1 tablet (81 mg total) by mouth daily. Swallow whole. 30 tablet 12   atorvastatin  (LIPITOR) 40 MG tablet Take 1 tablet (40 mg total) by mouth daily. 90 tablet 3   carvedilol  (COREG ) 12.5 MG tablet Take 1 tablet (12.5 mg total) by mouth 2 (two) times daily with a meal. 180 tablet 3   clopidogrel  (PLAVIX ) 75 MG tablet Take 1 tablet (75 mg total) by mouth daily. 90 tablet 2   darolutamide  (NUBEQA ) 300 MG tablet Take 2 tablets (600 mg total) by mouth 2 (two) times  daily with a meal. 120 tablet 11   insulin  aspart (NOVOLOG ) 100 UNIT/ML injection Inject 0-5 Units into the skin at bedtime.     insulin  aspart (NOVOLOG ) 100 UNIT/ML injection Inject 0-6 Units into the skin 3 (three) times daily with meals.     insulin  glargine-yfgn (SEMGLEE ) 100 UNIT/ML injection Inject 0.12 mLs (12 Units total) into the skin daily.     JARDIANCE  25 MG TABS tablet Take 25 mg by mouth daily.     metFORMIN  (GLUCOPHAGE ) 1000 MG tablet Take 1 tablet (1,000 mg total) by mouth 2 (two) times daily with a meal. 180 tablet 3   Nutritional Supplements (GLUCERNA 1.5 CAL/CARBSTEADY) LIQD Take 237 mLs by mouth 2 (two) times daily. 14220 mL 12   pantoprazole  (PROTONIX ) 20 MG tablet TAKE 1 TABLET BY MOUTH EVERY DAY 90 tablet 1   sertraline  (ZOLOFT ) 100 MG tablet TAKE 1 TABLET BY MOUTH EVERY DAY 90 tablet 2   tamsulosin  (FLOMAX ) 0.4 MG CAPS capsule Take 0.4 mg by mouth daily. (Patient not taking: Reported on 12/08/2023)     No current facility-administered medications for this visit.    REVIEW OF SYSTEMS (negative unless checked):   Cardiac:  []  Chest pain or chest pressure? []  Shortness of breath upon activity? []  Shortness of breath when lying flat? []  Irregular heart rhythm?  Vascular:  []   Pain in calf, thigh, or hip brought on by walking? []  Pain in feet at night that wakes you up from your sleep? []  Blood clot in your veins? []  Leg swelling?  Pulmonary:  []  Oxygen at home? []  Productive cough? []  Wheezing?  Neurologic:  []  Sudden weakness in arms or legs? []  Sudden numbness in arms or legs? []  Sudden onset of difficult speaking or slurred speech? []  Temporary loss of vision in one eye? []  Problems with dizziness?  Gastrointestinal:  []  Blood in stool? []  Vomited blood?  Genitourinary:  []  Burning when urinating? []  Blood in urine?  Psychiatric:  []  Major depression  Hematologic:  []  Bleeding problems? []  Problems with blood clotting?  Dermatologic:  []   Rashes or ulcers?  Constitutional:  []  Fever or chills?  Ear/Nose/Throat:  []  Change in hearing? []  Nose bleeds? []  Sore throat?  Musculoskeletal:  []  Back pain? []  Joint pain? []  Muscle pain?   Physical Examination   Vitals:   01/07/24 0932  BP: 126/68  Pulse: 73  Temp: 99.3 F (37.4 C)  TempSrc: Temporal  Weight: 204 lb (92.5 kg)   Body mass index is 31.95 kg/m.  General:  WDWN in NAD; vital signs documented above Gait: Not observed HENT: WNL, normocephalic Pulmonary: normal non-labored breathing  Cardiac: Regular Abdomen: soft, NT, no masses Skin: without rashes Vascular Exam/Pulses: Brisk right DP Doppler signal and left PT Doppler signal Extremities: Well-healed right lower extremity wounds Musculoskeletal: no muscle wasting or atrophy  Neurologic: A&O X 3;  No focal weakness or paresthesias are detected Psychiatric:  The pt has Normal affect.  Non-Invasive Vascular imaging   ABI (01/07/2024) R:  ABI: 1.06 (1.00),  PT: none DP: mono TBI: 0.32 L:  ABI: [place] (1.00),  PT: mono DP: mono TBI: 0.19   RLE Arterial Duplex (01/07/2024) Patent right SFA stent without stenosis  Medical Decision Making   [name] is a 73 y.o. male who presents for surveillance of PAD  Based on the patient's vascular studies, his ABIs are essentially unchanged since his last office visit.  His right ABI is 1.06.  His left ABI is noncompressible Arterial duplex demonstrates a patent right SFA stent without stenosis His previous right lower extremity wounds are well-healed.  He denies any further tissue loss.  He also denies any rest pain He does try to mobilize with physical therapy at his facility but he does have significant lower extremity weakness Given that he has no signs for critical limb ischemia he has no indications for further revascularization at this time.  He can follow-up with our office in 6 months with repeat right lower extremity arterial duplex and  ABIs   Kyle Holster PA-C [facility] Office: [phone]  Clinic MD: Lanis

## 2024-01-08 ENCOUNTER — Other Ambulatory Visit: Payer: Self-pay | Admitting: *Deleted

## 2024-01-08 DIAGNOSIS — C61 Malignant neoplasm of prostate: Secondary | ICD-10-CM | POA: Diagnosis not present

## 2024-01-08 DIAGNOSIS — I70245 Atherosclerosis of native arteries of left leg with ulceration of other part of foot: Secondary | ICD-10-CM

## 2024-01-08 DIAGNOSIS — R062 Wheezing: Secondary | ICD-10-CM | POA: Diagnosis not present

## 2024-01-08 DIAGNOSIS — I739 Peripheral vascular disease, unspecified: Secondary | ICD-10-CM

## 2024-01-08 DIAGNOSIS — I6930 Unspecified sequelae of cerebral infarction: Secondary | ICD-10-CM | POA: Diagnosis not present

## 2024-01-08 DIAGNOSIS — R2689 Other abnormalities of gait and mobility: Secondary | ICD-10-CM | POA: Diagnosis not present

## 2024-01-08 DIAGNOSIS — F331 Major depressive disorder, recurrent, moderate: Secondary | ICD-10-CM | POA: Diagnosis not present

## 2024-01-08 DIAGNOSIS — M6281 Muscle weakness (generalized): Secondary | ICD-10-CM | POA: Diagnosis not present

## 2024-01-09 DIAGNOSIS — N390 Urinary tract infection, site not specified: Secondary | ICD-10-CM | POA: Diagnosis not present

## 2024-01-09 DIAGNOSIS — E559 Vitamin D deficiency, unspecified: Secondary | ICD-10-CM | POA: Diagnosis not present

## 2024-01-11 DIAGNOSIS — N390 Urinary tract infection, site not specified: Secondary | ICD-10-CM | POA: Diagnosis not present

## 2024-01-11 DIAGNOSIS — E559 Vitamin D deficiency, unspecified: Secondary | ICD-10-CM | POA: Diagnosis not present

## 2024-01-11 DIAGNOSIS — D849 Immunodeficiency, unspecified: Secondary | ICD-10-CM | POA: Diagnosis not present

## 2024-01-11 DIAGNOSIS — J159 Unspecified bacterial pneumonia: Secondary | ICD-10-CM | POA: Diagnosis not present

## 2024-01-11 DIAGNOSIS — C61 Malignant neoplasm of prostate: Secondary | ICD-10-CM | POA: Diagnosis not present

## 2024-01-11 NOTE — Telephone Encounter (Signed)
 Oral Oncology Patient Advocate Encounter  I received Social Security letter of benefits and refaxed PAP form with letter.  [name], CPhT [place]  [facility] Pharm[facility] Services Pharmacy Technician Patient Advocate Specialist II [facility] [place] Phone: [phone]  Fax: [phone] [email or web address]

## 2024-01-12 DIAGNOSIS — M6281 Muscle weakness (generalized): Secondary | ICD-10-CM | POA: Diagnosis not present

## 2024-01-12 DIAGNOSIS — C61 Malignant neoplasm of prostate: Secondary | ICD-10-CM | POA: Diagnosis not present

## 2024-01-12 DIAGNOSIS — R2689 Other abnormalities of gait and mobility: Secondary | ICD-10-CM | POA: Diagnosis not present

## 2024-01-12 NOTE — Telephone Encounter (Signed)
 Oral Oncology Patient Advocate Encounter  [facility] has received all the documentation they need, but now wants to speak with the patient and they have tried to call him, but no answer. I told them he is in a facility now so that is why he is not answering. I emailed his daughter the phone number to contact with her dad so hopefully he can get approved.  [name], CPhT [place]  [facility] Pharmacy Technician Patient Advocate Specialist II [facility] [place] Phone: [phone]  Fax: [phone] [email or web address]

## 2024-01-13 DIAGNOSIS — M6281 Muscle weakness (generalized): Secondary | ICD-10-CM | POA: Diagnosis not present

## 2024-01-13 DIAGNOSIS — N390 Urinary tract infection, site not specified: Secondary | ICD-10-CM | POA: Diagnosis not present

## 2024-01-13 DIAGNOSIS — C61 Malignant neoplasm of prostate: Secondary | ICD-10-CM | POA: Diagnosis not present

## 2024-01-13 DIAGNOSIS — R2689 Other abnormalities of gait and mobility: Secondary | ICD-10-CM | POA: Diagnosis not present

## 2024-01-14 DIAGNOSIS — M6281 Muscle weakness (generalized): Secondary | ICD-10-CM | POA: Diagnosis not present

## 2024-01-14 DIAGNOSIS — C61 Malignant neoplasm of prostate: Secondary | ICD-10-CM | POA: Diagnosis not present

## 2024-01-14 DIAGNOSIS — R2689 Other abnormalities of gait and mobility: Secondary | ICD-10-CM | POA: Diagnosis not present

## 2024-01-15 DIAGNOSIS — R339 Retention of urine, unspecified: Secondary | ICD-10-CM | POA: Diagnosis not present

## 2024-01-15 DIAGNOSIS — M6281 Muscle weakness (generalized): Secondary | ICD-10-CM | POA: Diagnosis not present

## 2024-01-15 DIAGNOSIS — C61 Malignant neoplasm of prostate: Secondary | ICD-10-CM | POA: Diagnosis not present

## 2024-01-15 DIAGNOSIS — R2689 Other abnormalities of gait and mobility: Secondary | ICD-10-CM | POA: Diagnosis not present

## 2024-01-15 DIAGNOSIS — N139 Obstructive and reflux uropathy, unspecified: Secondary | ICD-10-CM | POA: Diagnosis not present

## 2024-01-16 DIAGNOSIS — R2689 Other abnormalities of gait and mobility: Secondary | ICD-10-CM | POA: Diagnosis not present

## 2024-01-16 DIAGNOSIS — M6281 Muscle weakness (generalized): Secondary | ICD-10-CM | POA: Diagnosis not present

## 2024-01-16 DIAGNOSIS — C61 Malignant neoplasm of prostate: Secondary | ICD-10-CM | POA: Diagnosis not present

## 2024-01-18 ENCOUNTER — Telehealth: Payer: Self-pay

## 2024-01-18 DIAGNOSIS — G8191 Hemiplegia, unspecified affecting right dominant side: Secondary | ICD-10-CM | POA: Diagnosis not present

## 2024-01-18 DIAGNOSIS — I69320 Aphasia following cerebral infarction: Secondary | ICD-10-CM | POA: Diagnosis not present

## 2024-01-18 DIAGNOSIS — R1311 Dysphagia, oral phase: Secondary | ICD-10-CM | POA: Diagnosis not present

## 2024-01-18 DIAGNOSIS — E118 Type 2 diabetes mellitus with unspecified complications: Secondary | ICD-10-CM | POA: Diagnosis not present

## 2024-01-18 DIAGNOSIS — I739 Peripheral vascular disease, unspecified: Secondary | ICD-10-CM | POA: Diagnosis not present

## 2024-01-18 DIAGNOSIS — I6930 Unspecified sequelae of cerebral infarction: Secondary | ICD-10-CM | POA: Diagnosis not present

## 2024-01-18 DIAGNOSIS — M6281 Muscle weakness (generalized): Secondary | ICD-10-CM | POA: Diagnosis not present

## 2024-01-18 DIAGNOSIS — N139 Obstructive and reflux uropathy, unspecified: Secondary | ICD-10-CM | POA: Diagnosis not present

## 2024-01-18 DIAGNOSIS — F32A Depression, unspecified: Secondary | ICD-10-CM | POA: Diagnosis not present

## 2024-01-18 DIAGNOSIS — M25461 Effusion, right knee: Secondary | ICD-10-CM | POA: Diagnosis not present

## 2024-01-18 DIAGNOSIS — C61 Malignant neoplasm of prostate: Secondary | ICD-10-CM | POA: Diagnosis not present

## 2024-01-18 DIAGNOSIS — K219 Gastro-esophageal reflux disease without esophagitis: Secondary | ICD-10-CM | POA: Diagnosis not present

## 2024-01-18 NOTE — Telephone Encounter (Signed)
 I spoke to someone at [facility] and she said that the scheduler is not there today but she would get the appointment information to her asap. I gave her the appointment details, the call back number and told her to ask for me or Dagoberto if needed.

## 2024-01-18 NOTE — Telephone Encounter (Signed)
 Called the patients daughter to see if she would be able to attend her fathers appointment on 8/20 per providers request. She informed me that she will not be attending.

## 2024-01-19 ENCOUNTER — Inpatient Hospital Stay (HOSPITAL_BASED_OUTPATIENT_CLINIC_OR_DEPARTMENT_OTHER)

## 2024-01-19 ENCOUNTER — Other Ambulatory Visit

## 2024-01-19 VITALS — BP 145/63 | HR 62 | Temp 97.5°F | Resp 18

## 2024-01-19 DIAGNOSIS — C7951 Secondary malignant neoplasm of bone: Secondary | ICD-10-CM | POA: Diagnosis present

## 2024-01-19 DIAGNOSIS — C779 Secondary and unspecified malignant neoplasm of lymph node, unspecified: Secondary | ICD-10-CM | POA: Diagnosis not present

## 2024-01-19 DIAGNOSIS — C61 Malignant neoplasm of prostate: Secondary | ICD-10-CM | POA: Diagnosis not present

## 2024-01-19 DIAGNOSIS — Z7982 Long term (current) use of aspirin: Secondary | ICD-10-CM | POA: Diagnosis not present

## 2024-01-19 DIAGNOSIS — Z79899 Other long term (current) drug therapy: Secondary | ICD-10-CM | POA: Diagnosis not present

## 2024-01-19 DIAGNOSIS — Z8673 Personal history of transient ischemic attack (TIA), and cerebral infarction without residual deficits: Secondary | ICD-10-CM

## 2024-01-19 DIAGNOSIS — I10 Essential (primary) hypertension: Secondary | ICD-10-CM

## 2024-01-19 DIAGNOSIS — R2689 Other abnormalities of gait and mobility: Secondary | ICD-10-CM | POA: Diagnosis not present

## 2024-01-19 DIAGNOSIS — Z7902 Long term (current) use of antithrombotics/antiplatelets: Secondary | ICD-10-CM | POA: Diagnosis not present

## 2024-01-19 DIAGNOSIS — E785 Hyperlipidemia, unspecified: Secondary | ICD-10-CM | POA: Diagnosis not present

## 2024-01-19 DIAGNOSIS — E7849 Other hyperlipidemia: Secondary | ICD-10-CM

## 2024-01-19 DIAGNOSIS — M6281 Muscle weakness (generalized): Secondary | ICD-10-CM | POA: Diagnosis not present

## 2024-01-19 LAB — CMP (CANCER CENTER ONLY)
ALT: 8 U/L (ref 0–44)
AST: 10 U/L — ABNORMAL LOW (ref 15–41)
Albumin: 3.7 g/dL (ref 3.5–5.0)
Alkaline Phosphatase: 47 U/L (ref 38–126)
Anion gap: 7 (ref 5–15)
BUN: 18 mg/dL (ref 8–23)
CO2: 27 mmol/L (ref 22–32)
Calcium: 8.6 mg/dL — ABNORMAL LOW (ref 8.9–10.3)
Chloride: 104 mmol/L (ref 98–111)
Creatinine: 0.93 mg/dL (ref 0.61–1.24)
GFR, Estimated: >60 mL/min (ref >60)
Glucose, Bld: 195 mg/dL — ABNORMAL HIGH (ref 70–99)
Potassium: 4.3 mmol/L (ref 3.5–5.1)
Sodium: 138 mmol/L (ref 135–145)
Total Bilirubin: 0.4 mg/dL (ref 0.0–1.2)
Total Protein: 6.3 g/dL — ABNORMAL LOW (ref 6.5–8.1)

## 2024-01-19 LAB — CBC WITH DIFFERENTIAL (CANCER CENTER ONLY)
Abs Immature Granulocytes: 0.04 K/uL (ref 0.00–0.07)
Basophils Absolute: 0.1 K/uL (ref 0.0–0.1)
Basophils Relative: 1 %
Eosinophils Absolute: 0.3 K/uL (ref 0.0–0.5)
Eosinophils Relative: 4 %
HCT: 33.7 % — ABNORMAL LOW (ref 39.0–52.0)
Hemoglobin: 10.6 g/dL — ABNORMAL LOW (ref 13.0–17.0)
Immature Granulocytes: 1 %
Lymphocytes Relative: 17 %
Lymphs Abs: 1.2 K/uL (ref 0.7–4.0)
MCH: 27.1 pg (ref 26.0–34.0)
MCHC: 31.5 g/dL (ref 30.0–36.0)
MCV: 86.2 fL (ref 80.0–100.0)
Monocytes Absolute: 0.6 K/uL (ref 0.1–1.0)
Monocytes Relative: 9 %
Neutro Abs: 4.9 K/uL (ref 1.7–7.7)
Neutrophils Relative %: 68 %
Platelet Count: 179 K/uL (ref 150–400)
RBC: 3.91 MIL/uL — ABNORMAL LOW (ref 4.22–5.81)
RDW: 14.9 % (ref 11.5–15.5)
WBC Count: 7.2 K/uL (ref 4.0–10.5)
nRBC: 0 % (ref 0.0–0.2)

## 2024-01-19 NOTE — Assessment & Plan Note (Addendum)
 Continue medication and monitor at facility

## 2024-01-19 NOTE — Assessment & Plan Note (Addendum)
 On aspirin  and plavix  and statin

## 2024-01-19 NOTE — Assessment & Plan Note (Addendum)
 Continue atorvastatin . No new side effects

## 2024-01-19 NOTE — Assessment & Plan Note (Addendum)
 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 TMB 7.2 Changed to darolutamide . Continue ADT at [facility]

## 2024-01-19 NOTE — Progress Notes (Signed)
 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   Current diagnosis: mCRPC Initial diagnosis: mHSPC Germline testing: Negative Somatic testing: Tempus. PIK3CA p.E542K missense variant (exon 9) 1.1 %. DNMT3A missense variant 29%. AR missense variant 3.4%. TMB 7.2. VUS of 7 genes. Treatment: ADT/AAP 02/28/22 ADT 04/2022 AAP 10/2022 PSA nadir 0.36. followed by rising PSA 04/2023 PSA 1.38 10/2023 PSA 3.2 T<10 11/04/23 PSA 10.7 T<10 Assessment & Plan Prostate cancer (HCC) 7/24-01/06/24 SBRT for solitary metastasis Genetic testing neg somatic testing showed PIK3CA, DNMT3A and AR missense variant. TMB 7.2 Changed to darolutamide . [facility] at [facility] History of CVA (cerebrovascular accident) On aspirin  and plavix  and statin Other hyperlipidemia Continue atorvastatin . No new side effects Essential hypertension, benign Continue medication and monitor at facility  Orders Placed This Encounter  Procedures   CBC with Differential (Cancer Center Only)    Standing Status:   Future    Expiration Date:   01/18/2025   CMP (Cancer Center only)    Standing Status:   Future    Expiration Date:   01/18/2025   Testosterone     Standing Status:   Future    Expiration Date:   01/18/2025   Prostate-Specific AG, Serum    Standing Status:   Future    Expiration Date:   01/18/2025     Kyle JAYSON Chihuahua, MD  INTERVAL HISTORY: Patient returns for follow-up.  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 GS 4+5 GG5 in 4 cores. PSA 150. PET with multifocal skeletal metastasis involving the pelvis, spine and LEFT ribs and a large RIGHT common iliac node. He was started on ADT in 01/2022 and AAP in 04/2022. PSA nadir in 10/2022 of 0.36 followed by rising PSA.    He met me in 11/2023. Repeat PET showed persistent radiotracer activity the posterior RIGHT acetabulum with associated bone sclerosis. Other bone lesions have resolved in the interval.  I referred him for treatment of oligometastasis.  He met with [name] and started SBRT.   At baseline he has right sided weakness. He is currently residing in nursing home requires assistance.  Also on a wheelchair.  Oncology History  Prostate cancer (HCC)  11/2021 Initial Diagnosis   Prostate cancer (HCC) De novo with bone, LN metastases. PSA 150. GG5 4+5 in 4 cores. Rest were 4+3 lesions   02/26/2022 PET scan   PSMA PET 1. Intense activity throughout the prostate gland consistent with primary prostate adenocarcinoma. Prostate gland enlarged. Foley catheter in place. 2. Suspicion of local prostate cancer extension to the LEFT seminal vesicle. 3. Multifocal radiotracer avid skeletal metastasis involving the pelvis, spine and LEFT ribs. There are seven lesions identified. 4. Large RIGHT common iliac node with mild radiotracer activity is favored necrotic nodal metastasis. 5. Incidental finding of a RIGHT inguinal hernia which contains nonobstructed loop of small bowel.   10/09/2023 PET scan   PSMA PET 1. Persistent radiotracer activity the posterior RIGHT acetabulum with associated bone sclerosis. Other bone lesions have resolved  in the interval. 2. Interval resolution of metastatic adenopathy in the pelvis. 3. No new adenopathy or visceral metastasis identified 4. Foley catheter within collapsed bladder.   01/19/2024 Cancer Staging   Staging form: Prostate, AJCC 8th Edition -  Clinical: Stage IVB (cTX, cN1, cM1b, Grade Group: 5) - Signed by Kyle Kyle BROCKS, MD on 01/19/2024 Histologic grading system: 5 grade system    Current Outpatient Medications on File Prior to Visit  Medication Sig Dispense Refill   acetaminophen  (TYLENOL ) 325 MG tablet Take 2 tablets (650 mg total) by mouth every 6 (six) hours as needed for mild pain (pain score 1-3) (or Fever >/= 101).     ADMELOG SOLOSTAR 100 UNIT/ML KwikPen Inject into the skin.     amlodipine -olmesartan  (AZOR ) 10-20 MG tablet Take 1 tablet by mouth daily. PLEASE SCHEDULE APPT PRIOR TO NEXT REFILL 90 tablet 0   aspirin  EC 81 MG tablet Take 1 tablet (81 mg total) by mouth daily. Swallow whole. 30 tablet 12   atorvastatin  (LIPITOR) 40 MG tablet Take 1 tablet (40 mg total) by mouth daily. 90 tablet 3   carvedilol  (COREG ) 12.5 MG tablet Take 1 tablet (12.5 mg total) by mouth 2 (two) times daily with a meal. 180 tablet 3   clopidogrel  (PLAVIX ) 75 MG tablet Take 1 tablet (75 mg total) by mouth daily. 90 tablet 2   darolutamide  (NUBEQA ) 300 MG tablet Take 2 tablets (600 mg total) by mouth 2 (two) times daily with a meal. 120 tablet 11   insulin  aspart (NOVOLOG ) 100 UNIT/ML injection Inject 0-5 Units into the skin at bedtime.     insulin  aspart (NOVOLOG ) 100 UNIT/ML injection Inject 0-6 Units into the skin 3 (three) times daily with meals.     insulin  glargine-yfgn (SEMGLEE ) 100 UNIT/ML injection Inject 0.12 mLs (12 Units total) into the skin daily.     JARDIANCE  25 MG TABS tablet Take 25 mg by mouth daily.     metFORMIN  (GLUCOPHAGE ) 1000 MG tablet Take 1 tablet (1,000 mg total) by mouth 2 (two) times daily with a meal. 180 tablet 3   Nutritional Supplements (GLUCERNA 1.5 CAL/CARBSTEADY) LIQD Take 237 mLs by mouth 2 (two) times daily. 14220 mL 12   pantoprazole  (PROTONIX ) 20 MG tablet TAKE 1 TABLET BY MOUTH EVERY DAY 90 tablet 1   sertraline  (ZOLOFT ) 100 MG tablet TAKE 1 TABLET BY MOUTH EVERY DAY 90 tablet 2   tamsulosin  (FLOMAX ) 0.4  MG CAPS capsule Take 0.4 mg by mouth daily. (Patient not taking: Reported on 12/08/2023)     No current facility-administered medications on file prior to visit.     PHYSICAL EXAMINATION: ECOG PERFORMANCE STATUS: 3 - Symptomatic, >50% confined to bed  Vitals:   01/19/24 1545 01/19/24 1552  BP: (!) 159/62 (!) 145/63  Pulse: 62   Resp: 18   Temp: (!) 97.5 F (36.4 C)   SpO2: 100%    There were no vitals filed for this visit.  GENERAL: alert, no distress and comfortable on wheelchair LUNGS: clear to auscultation and percussion with normal breathing effort HEART: regular rate & rhythm  NEURO: Baseline right side weaker than the left side  Relevant data reviewed during this visit included labs.

## 2024-01-20 ENCOUNTER — Other Ambulatory Visit (HOSPITAL_COMMUNITY): Payer: Self-pay

## 2024-01-20 ENCOUNTER — Telehealth: Payer: Self-pay

## 2024-01-20 DIAGNOSIS — F32A Depression, unspecified: Secondary | ICD-10-CM | POA: Diagnosis not present

## 2024-01-20 DIAGNOSIS — R2689 Other abnormalities of gait and mobility: Secondary | ICD-10-CM | POA: Diagnosis not present

## 2024-01-20 DIAGNOSIS — C61 Malignant neoplasm of prostate: Secondary | ICD-10-CM | POA: Diagnosis not present

## 2024-01-20 DIAGNOSIS — M6281 Muscle weakness (generalized): Secondary | ICD-10-CM | POA: Diagnosis not present

## 2024-01-20 LAB — TESTOSTERONE: Testosterone: 23 ng/dL — ABNORMAL LOW (ref 264–916)

## 2024-01-20 LAB — PROSTATE-SPECIFIC AG, SERUM (LABCORP): Prostate Specific Ag, Serum: 0.5 ng/mL (ref 0.0–4.0)

## 2024-01-20 NOTE — Telephone Encounter (Signed)
 Oral Oncology Patient Advocate Encounter   Received notification that prior authorization for [name] is required.   PA submitted on 01/20/24 Key [id number] Status is pending      [name],  CPhT-Adv  she/her/hers [facility]  [facility] Specialty Pharmacy Services Pharmacy Technician Patient Advocate Specialist III [facility] Phone: [phone]  Fax: [phone] [name]

## 2024-01-21 DIAGNOSIS — C61 Malignant neoplasm of prostate: Secondary | ICD-10-CM | POA: Diagnosis not present

## 2024-01-21 DIAGNOSIS — M6281 Muscle weakness (generalized): Secondary | ICD-10-CM | POA: Diagnosis not present

## 2024-01-21 DIAGNOSIS — R2689 Other abnormalities of gait and mobility: Secondary | ICD-10-CM | POA: Diagnosis not present

## 2024-01-21 NOTE — Telephone Encounter (Signed)
 Oral Oncology Patient Advocate Encounter   An urgent appeal for the prior authorization denial of [name] has been started by the pharmacist on 01/20/24.   This encounter will continue to be updated until final appeal determination.       [name],  CPhT-Adv  she/her/hers [facility]  [facility] Specialty Pharmacy Services Pharmacy Technician Patient Advocate Specialist III [facility] Phone: [phone]  Fax: [phone] [email or web address]

## 2024-01-21 NOTE — Telephone Encounter (Signed)
 Oral Oncology Patient Advocate Encounter  Received notification that the request for prior authorization for [name] has been denied due to:  We must deny this request because the information provided by your prescriber (diagnosis of metastatic castration-resistant prostate cancer) did not meet the requirement(s) of a medically-accepted indication.[name] Charlott Hamilton,  CPhT-Adv  she/her/hers [facility]  [facility] Specialty Pharmacy Services Pharmacy Technician Patient Advocate Specialist III [facility] Phone: [phone]  Fax: [phone] [email or web address]

## 2024-01-21 NOTE — Telephone Encounter (Signed)
 Oral Oncology Patient Advocate Encounter  Appeal has been faxed over 01/21/24 at 10:45am.  [name], CPhT [place]  [facility] Specialty Pharmacy Services Pharmacy Technician Patient Advocate Specialist II THER[facility] [place] Phone: [phone]  Fax: [phone] [email or web address]

## 2024-01-21 NOTE — Telephone Encounter (Signed)
 Oral Oncology Patient Advocate Encounter  Since patient could qualify for LIS, [employer] will not accept this patient into their program. We are going to try another medication in hopes of getting that one approved.  [name], CPhT [place]  [facility] Specialty Pharmacy Services Pharmacy Technician Patient Advocate Specialist II THER[facility] [place] Phone: [phone]  Fax: [phone] [email or web address]

## 2024-01-22 DIAGNOSIS — D849 Immunodeficiency, unspecified: Secondary | ICD-10-CM | POA: Diagnosis not present

## 2024-01-22 DIAGNOSIS — G893 Neoplasm related pain (acute) (chronic): Secondary | ICD-10-CM | POA: Diagnosis not present

## 2024-01-22 NOTE — Addendum Note (Signed)
 Encounter addended by: Sherwood Rise, PA-C on: 01/22/2024 11:04 AM  Actions taken: Clinical Note Signed

## 2024-01-25 ENCOUNTER — Other Ambulatory Visit (HOSPITAL_COMMUNITY): Payer: Self-pay

## 2024-01-25 ENCOUNTER — Other Ambulatory Visit: Payer: Self-pay

## 2024-01-25 ENCOUNTER — Telehealth: Payer: Self-pay

## 2024-01-25 ENCOUNTER — Telehealth: Payer: Self-pay | Admitting: Nurse Practitioner

## 2024-01-25 DIAGNOSIS — C61 Malignant neoplasm of prostate: Secondary | ICD-10-CM

## 2024-01-25 DIAGNOSIS — E118 Type 2 diabetes mellitus with unspecified complications: Secondary | ICD-10-CM | POA: Diagnosis not present

## 2024-01-25 MED ORDER — APALUTAMIDE 60 MG PO TABS: 240.0000 mg | ORAL_TABLET | Freq: Every day | ORAL | 11 refills | Status: DC

## 2024-01-25 NOTE — Telephone Encounter (Signed)
 Scheduled appointment per staff message. Talked with Jon from the facility in which the patient [name], and Mrs.Jon is aware of the made appointment for the patient.

## 2024-01-25 NOTE — Telephone Encounter (Signed)
 Oral Oncology Patient Advocate Encounter  Prior Authorization for Erleada  60MG  has been approved.    PA# [id number] Effective dates: 01/25/24 through 06/01/24  Patients co-pay is $0.00.    [name], CPhT [place]  [facility] Specialty Pharmacy Services Pharmacy Technician Patient Advocate Specialist II THER[place] [place] Phone: [phone]  Fax: [phone] [email or web address]

## 2024-01-25 NOTE — Telephone Encounter (Signed)
 Oral Oncology Pharmacist Encounter  Received new prescription for Erleada  (apalutamide ) for the treatment of metastatic, castration resistant prostate cancer in conjunction with ADT, planned duration until disease progression or unacceptable toxicity.  Labs from 01/19/2024 (CBC and CMP) assessed, no interventions needed. Prescription dose and frequency assessed for appropriateness.   Current medication list in Epic reviewed, DDIs with Erleada  identified: - clopidogrel  (cat D): CYP2C19 inducers may increase active metabolite exposure of clopidogrel .  - atorvastatin , azor , sertraline  (cat C): CYP3A4 strong inducers may decrease concentrations of atrovastatin, azor , and sertraline .   Evaluated chart and no patient barriers to medication adherence noted.   Prescription has been e-scribed to the [facility] for benefits analysis and approval.  Oral Oncology Clinic will continue to follow for insurance authorization, copayment issues, initial counseling and start date.  Shanya Ferriss, PharmD Hematology/Oncology Clinical Pharmacist [place] [facility] [phone] 01/25/2024 9:46 AM

## 2024-01-26 ENCOUNTER — Other Ambulatory Visit: Payer: Self-pay

## 2024-01-26 ENCOUNTER — Telehealth: Payer: Self-pay

## 2024-01-26 ENCOUNTER — Other Ambulatory Visit (HOSPITAL_COMMUNITY): Payer: Self-pay

## 2024-01-26 DIAGNOSIS — I6930 Unspecified sequelae of cerebral infarction: Secondary | ICD-10-CM | POA: Diagnosis not present

## 2024-01-26 DIAGNOSIS — N139 Obstructive and reflux uropathy, unspecified: Secondary | ICD-10-CM | POA: Diagnosis not present

## 2024-01-26 DIAGNOSIS — M6281 Muscle weakness (generalized): Secondary | ICD-10-CM | POA: Diagnosis not present

## 2024-01-26 DIAGNOSIS — I69320 Aphasia following cerebral infarction: Secondary | ICD-10-CM | POA: Diagnosis not present

## 2024-01-26 DIAGNOSIS — K219 Gastro-esophageal reflux disease without esophagitis: Secondary | ICD-10-CM | POA: Diagnosis not present

## 2024-01-26 DIAGNOSIS — F32A Depression, unspecified: Secondary | ICD-10-CM | POA: Diagnosis not present

## 2024-01-26 DIAGNOSIS — R1311 Dysphagia, oral phase: Secondary | ICD-10-CM | POA: Diagnosis not present

## 2024-01-26 DIAGNOSIS — M25461 Effusion, right knee: Secondary | ICD-10-CM | POA: Diagnosis not present

## 2024-01-26 DIAGNOSIS — C61 Malignant neoplasm of prostate: Secondary | ICD-10-CM

## 2024-01-26 DIAGNOSIS — I739 Peripheral vascular disease, unspecified: Secondary | ICD-10-CM | POA: Diagnosis not present

## 2024-01-26 DIAGNOSIS — G8191 Hemiplegia, unspecified affecting right dominant side: Secondary | ICD-10-CM | POA: Diagnosis not present

## 2024-01-26 DIAGNOSIS — E118 Type 2 diabetes mellitus with unspecified complications: Secondary | ICD-10-CM | POA: Diagnosis not present

## 2024-01-26 DIAGNOSIS — R2689 Other abnormalities of gait and mobility: Secondary | ICD-10-CM | POA: Diagnosis not present

## 2024-01-26 MED ORDER — APALUTAMIDE 60 MG PO TABS
120.0000 mg | ORAL_TABLET | Freq: Every day | ORAL | 11 refills | Status: DC
Start: 2024-01-26 — End: 2024-01-26

## 2024-01-26 NOTE — Progress Notes (Addendum)
 Report denial on darolutamide . Patient has history of more than one strokes, peripheral artery disease, persistent weakness due to previous stroke, hyperlipidemia.  Patient should have treatment for prostate cancer that has less penetration to the central nervous system to reduce risk of another stroke.    I called [phone]. It is [facility] and they advised me to call a different number and fax [phone]. I called [phone]. Automated voice said cannot appeal through the phone. I could not talk to a person.  Expedite appeal fax # [phone].

## 2024-01-26 NOTE — Telephone Encounter (Signed)
 Oral Oncology Patient Advocate Encounter   An urgent appeal for the prior authorization denial of Nubeqa  has been started by the pharmacist on 01/26/24.   This encounter will continue to be updated until final appeal determination.       Charlott Hamilton,  CPhT-Adv  she/her/hers [facility]  [facility] Specialty Pharmacy Services Pharmacy Technician Patient Advocate Specialist III [facility] Phone: [phone]  Fax: [phone] [email or web address]

## 2024-01-26 NOTE — Telephone Encounter (Signed)
 Left the scheduled appointment details with the receptionist. She stated the scheduler was currently unavailable and would give her the information. I asked that she call back to confirm the appointment details.

## 2024-01-27 ENCOUNTER — Encounter

## 2024-01-27 ENCOUNTER — Telehealth: Payer: Self-pay

## 2024-01-27 DIAGNOSIS — F331 Major depressive disorder, recurrent, moderate: Secondary | ICD-10-CM | POA: Diagnosis not present

## 2024-01-27 NOTE — Telephone Encounter (Signed)
 Canceled appointment per 8/27 secure chat. Talked with the patients daughter and she is aware of the changes made to the patients upcoming appointment.

## 2024-01-27 NOTE — Telephone Encounter (Signed)
 Oncology Pharmacist Encounter  Discussed patients case with oncologist. Due to multiple previous strokes it would be more ideal for patient to continue on Nubeqa  verus switching to Erleada  that has more CNS penetration. I reached out to our representative for Nubeqa  who was able to get patient another 2 months of samples to continue therapy since he is having a great response so far as we navigate his insurance and the patient assistance program. A second level appeal has been sent for the Nubeqa  to his medicare plan. Additionally, we are trying to figure out best steps with the patient assistance program. We will see if patient can return to clinic sometime next week for him to receive the Nubeqa  samples.   Miciah Shealy, PharmD Hematology/Oncology Clinical Pharmacist [place] [facility] [phone]

## 2024-01-28 ENCOUNTER — Telehealth: Payer: Self-pay

## 2024-01-28 ENCOUNTER — Ambulatory Visit

## 2024-01-28 NOTE — Telephone Encounter (Signed)
 Oral Oncology Patient Advocate Encounter   An urgent appeal for the prior authorization denial of Nubeqa  has been started by the pharmacist on 01/26/24.   This encounter will continue to be updated until final appeal determination.    [name], CPhT [place]  [facility] Specialty Pharmacy Services Pharmacy Technician Patient Advocate Specialist II [facility] [place] Phone: [phone]  Fax: [phone] [email or web address]

## 2024-01-28 NOTE — Telephone Encounter (Signed)
 Oral Oncology Patient Advocate Encounter  [name] is sending us  another sample while we wait on the appeal, [employer] tracking#: [id number] - it is scheduled to arrive Friday 01/29/24.  [name], CPhT [place]  [facility] Specialty Pharmacy Services Pharmacy Technician Patient Advocate Specialist II THER[place] [place] Phone: [phone]  Fax: [phone] [name]

## 2024-01-29 ENCOUNTER — Other Ambulatory Visit (HOSPITAL_COMMUNITY): Payer: Self-pay

## 2024-02-01 DIAGNOSIS — F339 Major depressive disorder, recurrent, unspecified: Secondary | ICD-10-CM | POA: Diagnosis not present

## 2024-02-01 DIAGNOSIS — G4700 Insomnia, unspecified: Secondary | ICD-10-CM | POA: Diagnosis not present

## 2024-02-01 DIAGNOSIS — C61 Malignant neoplasm of prostate: Secondary | ICD-10-CM | POA: Diagnosis not present

## 2024-02-01 DIAGNOSIS — M6281 Muscle weakness (generalized): Secondary | ICD-10-CM | POA: Diagnosis not present

## 2024-02-02 ENCOUNTER — Telehealth: Payer: Self-pay

## 2024-02-02 ENCOUNTER — Other Ambulatory Visit (HOSPITAL_COMMUNITY): Payer: Self-pay

## 2024-02-02 NOTE — Telephone Encounter (Signed)
 [name] called in to confirm [facility] upcoming appointments.

## 2024-02-02 NOTE — Progress Notes (Signed)
 Patient scheduled for labs at [facility] on 9/17.  Orders requested for CBC, CMP, and PSA.  RN will ensure results are available for upcoming follow up on 9/22.

## 2024-02-04 ENCOUNTER — Encounter: Payer: Self-pay | Admitting: *Deleted

## 2024-02-04 ENCOUNTER — Ambulatory Visit

## 2024-02-04 ENCOUNTER — Other Ambulatory Visit (HOSPITAL_COMMUNITY): Payer: Self-pay

## 2024-02-04 DIAGNOSIS — R338 Other retention of urine: Secondary | ICD-10-CM | POA: Diagnosis not present

## 2024-02-05 ENCOUNTER — Telehealth: Payer: Self-pay | Admitting: *Deleted

## 2024-02-05 NOTE — Telephone Encounter (Signed)
 On 02/04/2024 Marsha BRAVO Lagoy daughter Sophona ib to check on FMLA form.  Provided letter for employer as form not completed.   Completed FMLA paperwork sent to provider to review, amend, sign and return to this nurse to return to claims benefit manager. Today faxed form to [employer] [phone]).  Connected with Sophona [phone]), Advised of above asking if she wants a copy.   I will pick it up on Monday  Advised of Telephone visit Tuesday 02/08/2026 at 2:00 pm.if this is a better time,  No further questions or needs.  [place] advised of [employer] on patient portal which she will look into and help JEX STRAUSBAUGH his phone he has trouble hearing.     During call note question number 10 of form is incomplete,  Completed these questions.  Re-faxed to [phone].

## 2024-02-08 DIAGNOSIS — C61 Malignant neoplasm of prostate: Secondary | ICD-10-CM | POA: Diagnosis not present

## 2024-02-09 ENCOUNTER — Ambulatory Visit
Admission: RE | Admit: 2024-02-09 | Discharge: 2024-02-09 | Disposition: A | Discharge status: Home / Self Care | Source: Ambulatory Visit | Attending: Radiation Oncology | Admitting: Radiation Oncology

## 2024-02-09 ENCOUNTER — Other Ambulatory Visit: Payer: Self-pay

## 2024-02-09 ENCOUNTER — Other Ambulatory Visit (HOSPITAL_COMMUNITY): Payer: Self-pay

## 2024-02-09 DIAGNOSIS — C61 Malignant neoplasm of prostate: Secondary | ICD-10-CM | POA: Insufficient documentation

## 2024-02-09 DIAGNOSIS — C7951 Secondary malignant neoplasm of bone: Secondary | ICD-10-CM | POA: Insufficient documentation

## 2024-02-09 DIAGNOSIS — K219 Gastro-esophageal reflux disease without esophagitis: Secondary | ICD-10-CM | POA: Diagnosis not present

## 2024-02-09 DIAGNOSIS — N139 Obstructive and reflux uropathy, unspecified: Secondary | ICD-10-CM | POA: Diagnosis not present

## 2024-02-09 DIAGNOSIS — M25461 Effusion, right knee: Secondary | ICD-10-CM | POA: Diagnosis not present

## 2024-02-09 DIAGNOSIS — R1311 Dysphagia, oral phase: Secondary | ICD-10-CM | POA: Diagnosis not present

## 2024-02-09 DIAGNOSIS — I69320 Aphasia following cerebral infarction: Secondary | ICD-10-CM | POA: Diagnosis not present

## 2024-02-09 DIAGNOSIS — I739 Peripheral vascular disease, unspecified: Secondary | ICD-10-CM | POA: Diagnosis not present

## 2024-02-09 DIAGNOSIS — I6930 Unspecified sequelae of cerebral infarction: Secondary | ICD-10-CM | POA: Diagnosis not present

## 2024-02-09 DIAGNOSIS — E118 Type 2 diabetes mellitus with unspecified complications: Secondary | ICD-10-CM | POA: Diagnosis not present

## 2024-02-09 DIAGNOSIS — F32A Depression, unspecified: Secondary | ICD-10-CM | POA: Diagnosis not present

## 2024-02-09 DIAGNOSIS — M6281 Muscle weakness (generalized): Secondary | ICD-10-CM | POA: Diagnosis not present

## 2024-02-09 DIAGNOSIS — G8191 Hemiplegia, unspecified affecting right dominant side: Secondary | ICD-10-CM | POA: Diagnosis not present

## 2024-02-09 MED ORDER — NUBEQA 300 MG PO TABS
600.0000 mg | ORAL_TABLET | Freq: Two times a day (BID) | ORAL | 0 refills | Status: AC
  Filled 2024-02-09: qty 120, 30d supply, fill #0

## 2024-02-09 NOTE — Progress Notes (Signed)
  Radiation Oncology         [phone] ________________________________  Name: ILYAS LIPSITZ MRN: [id number]  Date of Service: 02/09/2024  DOB: 02-22-1951  Post Treatment Telephone Note   Diagnosis:  73 yo man with a Solitary oligometastatic bone metastasis from prostate cancer.    The patient was not available for call today.  Per daughter patient is unreachable she may attempt to reach him no answer.  Reports history of noncompliant with appointments.

## 2024-02-09 NOTE — Telephone Encounter (Signed)
 Medication Samples have been provided to the patient. Patient received on 02/08/2024.   Drug name: Nubeqa  (darolutamide )        Strength: 300mg          Qty: 120   LOT: [id number]   Exp.Date: 06/01/25  Dosing instructions: Take 2 tablets (600mg ) by mouth twice daily with food.   The patient has been instructed regarding the correct time, dose, and frequency of taking this medication, including desired effects and most common side effects.   Lash Matulich M Dona Walby 3:43 PM 02/09/2024

## 2024-02-10 ENCOUNTER — Telehealth: Payer: Self-pay

## 2024-02-10 NOTE — Telephone Encounter (Signed)
 I returned [name]'s call, to inform her that [facility] lab appointment is cancelled on 9/22 as he will get them done at [facility]. [name] stated that she will inform [facility] of these appointments.

## 2024-02-11 DIAGNOSIS — F331 Major depressive disorder, recurrent, moderate: Secondary | ICD-10-CM | POA: Diagnosis not present

## 2024-02-12 DIAGNOSIS — N139 Obstructive and reflux uropathy, unspecified: Secondary | ICD-10-CM | POA: Diagnosis not present

## 2024-02-12 DIAGNOSIS — R339 Retention of urine, unspecified: Secondary | ICD-10-CM | POA: Diagnosis not present

## 2024-02-15 DIAGNOSIS — F32A Depression, unspecified: Secondary | ICD-10-CM | POA: Diagnosis not present

## 2024-02-15 DIAGNOSIS — M6281 Muscle weakness (generalized): Secondary | ICD-10-CM | POA: Diagnosis not present

## 2024-02-15 DIAGNOSIS — C61 Malignant neoplasm of prostate: Secondary | ICD-10-CM | POA: Diagnosis not present

## 2024-02-15 NOTE — Progress Notes (Signed)
 RN left message with [facility] to follow up with getting patient back in for additional ADT.  Patient is scheduled for labs at [facility] on 9/17.

## 2024-02-16 ENCOUNTER — Inpatient Hospital Stay: Admitting: Nurse Practitioner

## 2024-02-16 ENCOUNTER — Encounter: Payer: Self-pay | Admitting: Nurse Practitioner

## 2024-02-16 VITALS — BP 169/70 | HR 60 | Temp 97.5°F | Resp 17 | Ht 67.0 in

## 2024-02-16 DIAGNOSIS — C7951 Secondary malignant neoplasm of bone: Secondary | ICD-10-CM | POA: Insufficient documentation

## 2024-02-16 DIAGNOSIS — Z7189 Other specified counseling: Secondary | ICD-10-CM | POA: Diagnosis not present

## 2024-02-16 DIAGNOSIS — R530 Neoplastic (malignant) related fatigue: Secondary | ICD-10-CM | POA: Diagnosis not present

## 2024-02-16 DIAGNOSIS — C61 Malignant neoplasm of prostate: Secondary | ICD-10-CM | POA: Diagnosis not present

## 2024-02-16 DIAGNOSIS — Z515 Encounter for palliative care: Secondary | ICD-10-CM | POA: Diagnosis not present

## 2024-02-16 DIAGNOSIS — G893 Neoplasm related pain (acute) (chronic): Secondary | ICD-10-CM

## 2024-02-16 DIAGNOSIS — C779 Secondary and unspecified malignant neoplasm of lymph node, unspecified: Secondary | ICD-10-CM | POA: Insufficient documentation

## 2024-02-16 NOTE — Progress Notes (Signed)
 Patient is scheduled back with the APC at [facility] and will receive Eligard  45mg  on 9/24.

## 2024-02-16 NOTE — Telephone Encounter (Addendum)
 Oral Oncology Patient Advocate Encounter  Received notification that the request for Appeal for Nubeqa  has been denied for 1st appeal, then [place] dismissed since 2nd level of appeal is outsourced to another company. Izetta has contacted them and got the number for the 2nd appeal company, and has given it to Dr. Tina to contact.  Will continue to update as information is gathered.   [name], CPhT [place]  [facility] Specialty Pharmacy Services Pharmacy Technician Patient Advocate Specialist II THER[place] [place] Phone: [phone]  Fax: [phone] [email or web address]

## 2024-02-16 NOTE — Progress Notes (Signed)
 Palliative Medicine [facility]  [phone] [phone]   Name: [name] Date: 02/16/2024 MRN: [id number]  DOB: Feb 14, 1951  Patient Care Team: Adele Song, MD as PCP - General Verlin Lonni BIRCH, MD as PCP - Cardiology (Cardiology) Verlin Lonni BIRCH, MD (Cardiology) Vertell Pont, RN as Oncology Nurse Navigator Pickenpack-Cousar, Fannie SAILOR, NP as Nurse Practitioner (Hospice and Palliative Medicine) Silvano Valrie SQUIBB, RN as Registered Nurse    INTERVAL HISTORY: [name] is a 73 y.o. 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 Medical problems include history of CVA with residual right-sided numbness, dysarthria, dysphagia, type 2 diabetes, hypertension, PVD, CHF, HLD, depression, and stage 2 CKD.   SOCIAL HISTORY:     reports that he has never smoked. He has never used smokeless tobacco. He reports that he does not drink alcohol and does not use drugs.  ADVANCE DIRECTIVES:  Currently full code, no MOST/HCPOA established, discussed DNR/DNI, will plan to follow up at next appt after he can discuss this with his children   CODE STATUS: Full code  PAST MEDICAL HISTORY: Past Medical History:  Diagnosis Date   CVA (cerebral infarction) 01/03/2008   MRI: Acute 1 x 1.5 cm infarction affecting the left side of the pons.   Diabetes mellitus    DVT (deep venous thrombosis) (HCC)    Hypertension    PAD (peripheral artery disease) (HCC)    Prostate cancer (HCC)    Stroke (HCC)    2008    ALLERGIES:  is allergic to beef-derived drug products, pork allergy, and shrimp flavor agent (non-screening).  MEDICATIONS:  Current Outpatient Medications  Medication Sig Dispense Refill   acetaminophen  (TYLENOL ) 325 MG tablet Take 2 tablets (650 mg total) by mouth every 6 (six) hours as needed for mild pain (pain score 1-3) (or Fever >/= 101).     ADMELOG SOLOSTAR 100 UNIT/ML KwikPen Inject into the skin.      amlodipine -olmesartan  (AZOR ) 10-20 MG tablet Take 1 tablet by mouth daily. PLEASE SCHEDULE APPT PRIOR TO NEXT REFILL 90 tablet 0   aspirin  EC 81 MG tablet Take 1 tablet (81 mg total) by mouth daily. Swallow whole. 30 tablet 12   atorvastatin  (LIPITOR) 40 MG tablet Take 1 tablet (40 mg total) by mouth daily. 90 tablet 3   carvedilol  (COREG ) 12.5 MG tablet Take 1 tablet (12.5 mg total) by mouth 2 (two) times daily with a meal. 180 tablet 3   clopidogrel  (PLAVIX ) 75 MG tablet Take 1 tablet (75 mg total) by mouth daily. 90 tablet 2   darolutamide  (NUBEQA ) 300 MG tablet Take 2 tablets (600 mg total) by mouth 2 (two) times daily with a meal. 120 tablet 0   insulin  aspart (NOVOLOG ) 100 UNIT/ML injection Inject 0-5 Units into the skin at bedtime.     insulin  aspart (NOVOLOG ) 100 UNIT/ML injection Inject 0-6 Units into the skin 3 (three) times daily with meals.     insulin  glargine-yfgn (SEMGLEE ) 100 UNIT/ML injection Inject 0.12 mLs (12 Units total) into the skin daily.     JARDIANCE  25 MG TABS tablet Take 25 mg by mouth daily.     metFORMIN  (GLUCOPHAGE ) 1000 MG tablet Take 1 tablet (1,000 mg total) by mouth 2 (two) times daily with a meal. 180 tablet 3   Nutritional Supplements (GLUCERNA 1.5 CAL/CARBSTEADY) LIQD Take 237 mLs by mouth 2 (two) times daily. 14220 mL 12   pantoprazole  (PROTONIX ) 20 MG tablet TAKE 1 TABLET BY MOUTH  EVERY DAY 90 tablet 1   sertraline  (ZOLOFT ) 100 MG tablet TAKE 1 TABLET BY MOUTH EVERY DAY 90 tablet 2   tamsulosin  (FLOMAX ) 0.4 MG CAPS capsule Take 0.4 mg by mouth daily. (Patient not taking: Reported on 12/08/2023)     No current facility-administered medications for this visit.    VITAL SIGNS: BP (!) 169/70 (BP Location: Left Arm, Patient Position: Sitting) Comment: nurse is aware  Pulse 60   Temp (!) 97.5 F (36.4 C) (Temporal)   Resp 17   Ht 5' 7 (1.702 m)   SpO2 100%   BMI 31.95 kg/m  Filed Weights    Estimated body mass index is 31.95 kg/m as calculated from  the following:   Height as of this encounter: 5' 7 (1.702 m).   Weight as of 01/07/24: 204 lb (92.5 kg).   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  History of Present Illness [name] is a 73 year old male who presents to clinic for a follow-up. He is accompanied by his daughter, Mercy. He continues to reside at [facility].   He has ongoing concerns about the organization and management of his care at the nursing home. He was not informed about today's appointment and was woken up early for it, leading to frustration with the nursing home's scheduling and communication practices. Will send a calendar with patient for his own personal knowledge.   He recently experienced a two-week episode of pneumonia, which was not communicated to his family by the nursing home. Daughter reports this is not her complete understanding and patient was not feeling well but no confirmed signs of pneumonia.   He is concerned about the food provided at the nursing home, stating it is too rich in starch and sugar, which is problematic given his type 2 diabetes. There have been incidents where food was taken away or not provided, necessitating his daughter to bring him soup. His daughter reports patient's aggressive behaviors towards staff including throwing food at staff, attributing issues to misunderstandings and miscommunications regarding his meals. Daughter is emotional expressing patient's ongoing aggressiveness and language towards both she and other family members. I attempted to  create a safe space for them to discuss frustration and feelings.   Mr. Woolstenhulme is expressing desires for his oldest son to serve as his medical decision maker. [name] expresses her understanding and willingness to allow her brother to do so however states her brother or any of the siblings want very little to do with patient. I encouraged ongoing family discussions.   Patient reports persistent pain and discomfort. States he is only receiving Tylenol  at his facility. Per paperwork on hand I advised for moderate to severe pain he has tramadol available. They verbalized understanding.   All questions answered and support provided.   Goals of Care 8/4: We discussed his wishes. Patient continues to request time to consider who he would like to be listed as his medical decision-maker.  We discussed at length with his daughter today who is familiar with the process.  Patient states most likely she would be his medical decision-maker however  again wishes to discuss with his entire family before putting anything in writing.  He verbalizes the importance of completing such document expressing wishes to follow-up with further discussions in the near future.  7/8: Extensive goals of care discussion completed.  Patient indicates his health is ok considering his limited access to healthy food and difficulty sleeping.  He seems to have a fair understanding of his health with some limited insight into his current condition.  He gives consent for an open on discussion of his current medical problems.  Therefore, we discussed his castrate resistant metastatic prostate cancer and prognosis as well as that his treatment is now palliative vs curative and what that means. We also discussed his metastatic prostate and what it means in the larger context of his on-going co-morbidities (ie CVA, impaired functional status, T2DM, vascular disease, HTN, etc). Natural disease trajectory and expectations were discussed.  He  verbalized understanding is thankful for the discussion regarding his health condition.  We discussed that while we hope the chemo will be effective at controlling his cancer and limiting its spread, we are concerned about the possibility of his health worsening and worsening rapidly.  Therefore, discussed advanced care planning for things he would want or not want if his health were to worsen.  This discussion includes what quality of life means for him.  He indicates that quality of life means being able to either maintain or improve his current level of functioning. He hopes to return home. He would not want to be bedbound and dependent on others for all ADLs.  He desires to be able to be aware/cognizant enough to have conversations and spend meaningful time with family.  We discussed given his current cancer diagnosis as well as his other comorbidities, it would be appropriate for him to consider DNR/DNI.  We also discussed the limitations of CPR in older adults with advanced illness and with similar co morbidities.  He states this makes sense and would like to pursue DNR/DNI however, he feels he needs to discuss this transition with his children.  He has never had discussions regarding his goals and wishes were his health to acutely worsen with his children but plans to do so soon.  We also discussed the utility of having a healthcare power of attorney.  He states that he will speak to his children and determine who he would want to be in HCPOA.  Educated that completing HCPOA documentation and something we can assist with at the cancer center.  He is grateful for this and will let us  know when he decides on HCPOA.  We reviewed MOST form and extensive education was provided with each section.  He indicates at this time leaning towards DNR/DNI, limited interventions, okay for hospitalization, noninvasive airway support, IV fluids, antibiotics, and feeding tube trial but again wishes to speak to his family first.   He was provided with a copy of a MOST form as well as a copy of the hard choices for loving people booklet.  All questions were answered.   Assessment & Plan  Chronic pain Chronic pain management is ongoing with tramadol prescribed for pain relief. Concerns exist regarding daily pain and potential decline. Discrepancies in reports about his cooperation with medication administration due to behavior. - Ensure tramadol is available and administered as prescribed - Discuss pain management during the next appointment  Type 2 diabetes mellitus Type 2 diabetes mellitus management is ongoing. Concerns about dietary intake at the nursing home, reported as  too rich in starch and sugar, potentially affecting blood glucose control. - Review dietary intake and ensure it aligns with diabetic management - Discuss dietary concerns with nursing home staff  Urinary catheter management Urinary catheter in place. No issues reported.  Goals of Care Need to establish a clear plan for medical decision-making in emergencies due to family discord. Unclear who will be the designated decision-maker, with concerns about the ability of his oldest and youngest sons to fulfill this role. - Discuss and establish a healthcare proxy or power of attorney - Clarify decision-making roles with family members.  I will plan to see patient back in 3-4 weeks.  Sooner if needed.  Patient expressed understanding and was in agreement with this plan. He also understands that He can call the clinic at any time with any questions, concerns, or complaints.   Any controlled substances utilized were prescribed in the context of palliative care. PDMP has been reviewed.   Visit consisted of counseling and education dealing with the complex and emotionally intense issues of symptom management and palliative care in the setting of serious and potentially life-threatening illness.  Levon Borer, AGPCNP-BC  Palliative Medicine  Team/[facility]

## 2024-02-17 DIAGNOSIS — F32A Depression, unspecified: Secondary | ICD-10-CM | POA: Diagnosis not present

## 2024-02-17 DIAGNOSIS — C61 Malignant neoplasm of prostate: Secondary | ICD-10-CM | POA: Diagnosis not present

## 2024-02-18 DIAGNOSIS — C61 Malignant neoplasm of prostate: Secondary | ICD-10-CM | POA: Diagnosis not present

## 2024-02-18 DIAGNOSIS — N182 Chronic kidney disease, stage 2 (mild): Secondary | ICD-10-CM | POA: Diagnosis not present

## 2024-02-18 DIAGNOSIS — E118 Type 2 diabetes mellitus with unspecified complications: Secondary | ICD-10-CM | POA: Diagnosis not present

## 2024-02-18 DIAGNOSIS — F32A Depression, unspecified: Secondary | ICD-10-CM | POA: Diagnosis not present

## 2024-02-19 DIAGNOSIS — R1311 Dysphagia, oral phase: Secondary | ICD-10-CM | POA: Diagnosis not present

## 2024-02-19 DIAGNOSIS — F32A Depression, unspecified: Secondary | ICD-10-CM | POA: Diagnosis not present

## 2024-02-19 DIAGNOSIS — I69320 Aphasia following cerebral infarction: Secondary | ICD-10-CM | POA: Diagnosis not present

## 2024-02-19 DIAGNOSIS — I739 Peripheral vascular disease, unspecified: Secondary | ICD-10-CM | POA: Diagnosis not present

## 2024-02-19 DIAGNOSIS — C61 Malignant neoplasm of prostate: Secondary | ICD-10-CM | POA: Diagnosis not present

## 2024-02-19 DIAGNOSIS — K219 Gastro-esophageal reflux disease without esophagitis: Secondary | ICD-10-CM | POA: Diagnosis not present

## 2024-02-19 DIAGNOSIS — I6930 Unspecified sequelae of cerebral infarction: Secondary | ICD-10-CM | POA: Diagnosis not present

## 2024-02-19 DIAGNOSIS — N139 Obstructive and reflux uropathy, unspecified: Secondary | ICD-10-CM | POA: Diagnosis not present

## 2024-02-19 DIAGNOSIS — M25461 Effusion, right knee: Secondary | ICD-10-CM | POA: Diagnosis not present

## 2024-02-19 DIAGNOSIS — E118 Type 2 diabetes mellitus with unspecified complications: Secondary | ICD-10-CM | POA: Diagnosis not present

## 2024-02-19 DIAGNOSIS — M6281 Muscle weakness (generalized): Secondary | ICD-10-CM | POA: Diagnosis not present

## 2024-02-19 DIAGNOSIS — G8191 Hemiplegia, unspecified affecting right dominant side: Secondary | ICD-10-CM | POA: Diagnosis not present

## 2024-02-19 NOTE — Progress Notes (Unsigned)
 [facility] OFFICE PROGRESS NOTE  Patient Care Team: Adele Song, [name] as PCP - General Verlin Lonni BIRCH, [name] as PCP - Cardiology (Cardiology) Verlin Lonni BIRCH, [name] (Cardiology) Vertell Pont, RN as Oncology Nurse Navigator Pickenpack-Cousar, Fannie SAILOR, NP as Nurse Practitioner (Hospice and Palliative Medicine) Silvano Valrie SQUIBB, RN as Registered Nurse  [name] is a 73 y.o.male with history of CVAs with chronic right-sided weakness dysarthria and dysphagia, DM, HTN, PAD, and prostate cancer [facility].   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 GS 4+5 GG5 in 4 cores. PSA 150. PET with multifocal skeletal metastasis involving the pelvis, spine and LEFT ribs and a large RIGHT common iliac node. He was started on ADT in 01/2022 and AAP in 04/2022. PSA nadir in 10/2022 of 0.36 followed by rising PSA.   He is responding to darolutamide  very well.  Unfortunately, his insurance had declined approval.  Several appeal have been done. Will call again this week.  Current diagnosis: mCRPC Initial diagnosis: mHSPC Germline testing: Negative Somatic testing: Tempus. PIK3CA p.E542K missense variant (exon 9) 1.1 %. DNMT3A missense variant 29%. AR missense variant 3.4%. TMB 7.2. VUS of 7 genes. Treatment: ADT/AAP 02/28/22 ADT 04/2022 AAP 10/2022 PSA nadir 0.36. followed by rising PSA 04/2023 PSA 1.38 10/2023 PSA 3.2 T<10 11/04/23 PSA 10.7 T<10 02/17/24 PSA 0.3 T 193 Assessment & Plan Prostate cancer (HCC) 7/24-01/06/24 SBRT for solitary metastasis Genetic testing neg somatic testing showed PIK3CA, DNMT3A and AR missense variant. TMB 7.2 Changed to darolutamide . Difficulty obtain insurance approval from his [employer]. Continue ADT at [facility], will receive Eligard  45mg  on 9/24  History of CVA (cerebrovascular accident) On aspirin  and plavix  and statin Essential hypertension, benign Continue medication and  monitor at facility  Orders Placed This Encounter  Procedures   CBC with Differential (Cancer Center Only)    Standing Status:   Future    Expiration Date:   02/22/2025   CMP (Cancer Center only)    Standing Status:   Future    Expiration Date:   02/22/2025   PSA    Standing Status:   Future    Expiration Date:   02/22/2025   Testosterone     Standing Status:   Future    Expiration Date:   02/22/2025   Lab about a week before visit and follow up on 11/18 in later in the afternoon   Kyle JAYSON Chihuahua, [name]  INTERVAL HISTORY: Patient returns for follow-up.  Oncology History  Prostate cancer (HCC)  11/2021 Initial Diagnosis   Prostate cancer (HCC) De novo with bone, LN metastases. PSA 150. GG5 4+5 in 4 cores. Rest were 4+3 lesions   02/26/2022 PET scan   PSMA PET 1. Intense activity throughout the prostate gland consistent with primary prostate adenocarcinoma. Prostate gland enlarged. Foley catheter in place. 2. Suspicion of local prostate cancer extension to the LEFT seminal vesicle. 3. Multifocal radiotracer avid skeletal metastasis involving the pelvis, spine and LEFT ribs. There are seven lesions identified. 4. Large RIGHT common iliac node with mild radiotracer activity is favored necrotic nodal metastasis. 5. Incidental finding of a RIGHT inguinal hernia which contains nonobstructed loop of small bowel.   10/09/2023 PET scan   PSMA PET 1. Persistent radiotracer activity the posterior RIGHT acetabulum with associated bone sclerosis. Other bone lesions have resolved in the interval. 2. Interval resolution of metastatic adenopathy in the pelvis. 3. No new adenopathy or visceral metastasis identified  4. Foley catheter within collapsed bladder.   01/19/2024 Cancer Staging   Staging form: Prostate, AJCC 8th Edition - Clinical: Stage IVB (cTX, cN1, cM1b, Grade Group: 5) - Signed by Tina Kyle BROCKS, [name] on 01/19/2024 Histologic grading system: 5 grade system      PHYSICAL  EXAMINATION: ECOG PERFORMANCE STATUS: 3 - Symptomatic, >50% confined to bed  Vitals:   02/22/24 1402  BP: (!) 148/58  Pulse: (!) 58  Resp: 13  Temp: (!) 97 F (36.1 C)  SpO2: 100%   Filed Weights   02/22/24 1402  Weight: 205 lb 4.8 oz (93.1 kg)    GENERAL: alert, no distress and comfortable on a wheelchair SKIN: skin color normal  LUNGS: clear to auscultation and no wheeze or rales with normal breathing effort HEART: regular rate & rhythm  ABDOMEN: abdomen soft, non-tender and nondistended. NEURO: right side weakness. RLE 0/5.   Relevant data reviewed during this visit included labs.  New labs ordered.

## 2024-02-19 NOTE — Assessment & Plan Note (Addendum)
 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 TMB 7.2 Changed to darolutamide . Difficulty obtain insurance approval from his [employer]. Continue ADT at [facility], will receive Eligard  45mg  on 9/24

## 2024-02-22 ENCOUNTER — Inpatient Hospital Stay (HOSPITAL_BASED_OUTPATIENT_CLINIC_OR_DEPARTMENT_OTHER)

## 2024-02-22 ENCOUNTER — Other Ambulatory Visit

## 2024-02-22 VITALS — BP 148/58 | HR 58 | Temp 97.0°F | Resp 13 | Wt 205.3 lb

## 2024-02-22 DIAGNOSIS — Z8673 Personal history of transient ischemic attack (TIA), and cerebral infarction without residual deficits: Secondary | ICD-10-CM

## 2024-02-22 DIAGNOSIS — I10 Essential (primary) hypertension: Secondary | ICD-10-CM

## 2024-02-22 DIAGNOSIS — C7951 Secondary malignant neoplasm of bone: Secondary | ICD-10-CM | POA: Diagnosis not present

## 2024-02-22 DIAGNOSIS — C61 Malignant neoplasm of prostate: Secondary | ICD-10-CM | POA: Diagnosis not present

## 2024-02-22 DIAGNOSIS — N139 Obstructive and reflux uropathy, unspecified: Secondary | ICD-10-CM | POA: Diagnosis not present

## 2024-02-22 DIAGNOSIS — C779 Secondary and unspecified malignant neoplasm of lymph node, unspecified: Secondary | ICD-10-CM | POA: Diagnosis not present

## 2024-02-22 NOTE — Assessment & Plan Note (Addendum)
 Continue medication and monitor at facility

## 2024-02-22 NOTE — Telephone Encounter (Signed)
 Medication Samples have been provided to the patient. Patient received on 02/22/2024.    Drug name: Nubeqa  (darolutamide )        Strength: 300mg          Qty: 120                       LOT: [id number]              Exp.Date: 06/01/25   Dosing instructions: Take 2 tablets (600mg ) by mouth twice daily with food.    The patient has been instructed regarding the correct time, dose, and frequency of taking this medication, including desired effects and most common side effects.  Deedee Lybarger, PharmD Hematology/Oncology Clinical Pharmacist [facility] [phone]

## 2024-02-22 NOTE — Assessment & Plan Note (Addendum)
 On aspirin  and plavix  and statin

## 2024-02-23 ENCOUNTER — Encounter: Payer: Self-pay | Admitting: Urology

## 2024-02-24 DIAGNOSIS — C775 Secondary and unspecified malignant neoplasm of intrapelvic lymph nodes: Secondary | ICD-10-CM | POA: Diagnosis not present

## 2024-02-24 DIAGNOSIS — C61 Malignant neoplasm of prostate: Secondary | ICD-10-CM | POA: Diagnosis not present

## 2024-02-24 DIAGNOSIS — C7951 Secondary malignant neoplasm of bone: Secondary | ICD-10-CM | POA: Diagnosis not present

## 2024-02-25 ENCOUNTER — Telehealth: Payer: Self-pay

## 2024-02-25 DIAGNOSIS — R197 Diarrhea, unspecified: Secondary | ICD-10-CM | POA: Diagnosis not present

## 2024-02-25 DIAGNOSIS — C61 Malignant neoplasm of prostate: Secondary | ICD-10-CM | POA: Diagnosis not present

## 2024-02-25 NOTE — Telephone Encounter (Signed)
 Called [phone] twice for appeal on darolutamide  but no answer. Left message to call back.

## 2024-02-25 NOTE — Progress Notes (Signed)
 Patient received Eligard  45mg  on 9/24 at [facility].

## 2024-03-04 NOTE — Telephone Encounter (Signed)
 Oral Oncology Patient Advocate Encounter  I called [employer] and got the information updated for me to resubmit the 2nd level of appeal to [facility]. Letter has been attached and faxed over.  [name], CPhT [place]  [facility] Specialty Pharmacy Services Pharmacy Technician Patient Advocate Specialist [facility] [place] Phone: [phone]  Fax: [phone] [email or web address]

## 2024-03-08 DIAGNOSIS — N139 Obstructive and reflux uropathy, unspecified: Secondary | ICD-10-CM | POA: Diagnosis not present

## 2024-03-08 DIAGNOSIS — E118 Type 2 diabetes mellitus with unspecified complications: Secondary | ICD-10-CM | POA: Diagnosis not present

## 2024-03-08 DIAGNOSIS — I69320 Aphasia following cerebral infarction: Secondary | ICD-10-CM | POA: Diagnosis not present

## 2024-03-08 DIAGNOSIS — M6281 Muscle weakness (generalized): Secondary | ICD-10-CM | POA: Diagnosis not present

## 2024-03-08 DIAGNOSIS — I6930 Unspecified sequelae of cerebral infarction: Secondary | ICD-10-CM | POA: Diagnosis not present

## 2024-03-08 DIAGNOSIS — M25461 Effusion, right knee: Secondary | ICD-10-CM | POA: Diagnosis not present

## 2024-03-08 DIAGNOSIS — R1311 Dysphagia, oral phase: Secondary | ICD-10-CM | POA: Diagnosis not present

## 2024-03-08 DIAGNOSIS — G8191 Hemiplegia, unspecified affecting right dominant side: Secondary | ICD-10-CM | POA: Diagnosis not present

## 2024-03-08 DIAGNOSIS — K219 Gastro-esophageal reflux disease without esophagitis: Secondary | ICD-10-CM | POA: Diagnosis not present

## 2024-03-08 DIAGNOSIS — C61 Malignant neoplasm of prostate: Secondary | ICD-10-CM | POA: Diagnosis not present

## 2024-03-08 DIAGNOSIS — I739 Peripheral vascular disease, unspecified: Secondary | ICD-10-CM | POA: Diagnosis not present

## 2024-03-08 DIAGNOSIS — F32A Depression, unspecified: Secondary | ICD-10-CM | POA: Diagnosis not present

## 2024-03-11 DIAGNOSIS — R339 Retention of urine, unspecified: Secondary | ICD-10-CM | POA: Diagnosis not present

## 2024-03-14 DIAGNOSIS — C61 Malignant neoplasm of prostate: Secondary | ICD-10-CM | POA: Diagnosis not present

## 2024-03-14 DIAGNOSIS — I69351 Hemiplegia and hemiparesis following cerebral infarction affecting right dominant side: Secondary | ICD-10-CM | POA: Diagnosis not present

## 2024-03-14 DIAGNOSIS — K219 Gastro-esophageal reflux disease without esophagitis: Secondary | ICD-10-CM | POA: Diagnosis not present

## 2024-03-14 DIAGNOSIS — E118 Type 2 diabetes mellitus with unspecified complications: Secondary | ICD-10-CM | POA: Diagnosis not present

## 2024-03-14 DIAGNOSIS — F32A Depression, unspecified: Secondary | ICD-10-CM | POA: Diagnosis not present

## 2024-03-14 DIAGNOSIS — E785 Hyperlipidemia, unspecified: Secondary | ICD-10-CM | POA: Diagnosis not present

## 2024-03-14 DIAGNOSIS — Z79899 Other long term (current) drug therapy: Secondary | ICD-10-CM | POA: Diagnosis not present

## 2024-03-15 DIAGNOSIS — C61 Malignant neoplasm of prostate: Secondary | ICD-10-CM | POA: Diagnosis not present

## 2024-03-15 DIAGNOSIS — E118 Type 2 diabetes mellitus with unspecified complications: Secondary | ICD-10-CM | POA: Diagnosis not present

## 2024-03-17 DIAGNOSIS — E118 Type 2 diabetes mellitus with unspecified complications: Secondary | ICD-10-CM | POA: Diagnosis not present

## 2024-03-17 DIAGNOSIS — N182 Chronic kidney disease, stage 2 (mild): Secondary | ICD-10-CM | POA: Diagnosis not present

## 2024-03-17 DIAGNOSIS — C61 Malignant neoplasm of prostate: Secondary | ICD-10-CM | POA: Diagnosis not present

## 2024-03-17 DIAGNOSIS — F32A Depression, unspecified: Secondary | ICD-10-CM | POA: Diagnosis not present

## 2024-03-21 ENCOUNTER — Other Ambulatory Visit (HOSPITAL_COMMUNITY): Payer: Self-pay

## 2024-03-21 DIAGNOSIS — C61 Malignant neoplasm of prostate: Secondary | ICD-10-CM | POA: Diagnosis not present

## 2024-03-21 DIAGNOSIS — E118 Type 2 diabetes mellitus with unspecified complications: Secondary | ICD-10-CM | POA: Diagnosis not present

## 2024-03-21 DIAGNOSIS — I69320 Aphasia following cerebral infarction: Secondary | ICD-10-CM | POA: Diagnosis not present

## 2024-03-21 DIAGNOSIS — M6281 Muscle weakness (generalized): Secondary | ICD-10-CM | POA: Diagnosis not present

## 2024-03-21 DIAGNOSIS — F32A Depression, unspecified: Secondary | ICD-10-CM | POA: Diagnosis not present

## 2024-03-21 DIAGNOSIS — R1311 Dysphagia, oral phase: Secondary | ICD-10-CM | POA: Diagnosis not present

## 2024-03-21 DIAGNOSIS — K219 Gastro-esophageal reflux disease without esophagitis: Secondary | ICD-10-CM | POA: Diagnosis not present

## 2024-03-21 DIAGNOSIS — M25461 Effusion, right knee: Secondary | ICD-10-CM | POA: Diagnosis not present

## 2024-03-21 DIAGNOSIS — N139 Obstructive and reflux uropathy, unspecified: Secondary | ICD-10-CM | POA: Diagnosis not present

## 2024-03-21 DIAGNOSIS — G8191 Hemiplegia, unspecified affecting right dominant side: Secondary | ICD-10-CM | POA: Diagnosis not present

## 2024-03-21 DIAGNOSIS — I6930 Unspecified sequelae of cerebral infarction: Secondary | ICD-10-CM | POA: Diagnosis not present

## 2024-03-21 DIAGNOSIS — I739 Peripheral vascular disease, unspecified: Secondary | ICD-10-CM | POA: Diagnosis not present

## 2024-03-21 NOTE — Telephone Encounter (Signed)
 Oral Oncology Patient Advocate Encounter  I called to check the status since we have not heard anything yet. [facility] with [place] has had no updates on their end, I called [facility] and left a message (only way to communicate with them) to check the status. I will update if I hear anything back.  [name], CPhT [place]  [facility] Specialty Pharmacy Services Oncology Pharmacy Patient Advocate Specialist [facility] [place] Phone: [phone]  Fax: [phone] [email or web address]

## 2024-03-22 DIAGNOSIS — F331 Major depressive disorder, recurrent, moderate: Secondary | ICD-10-CM | POA: Diagnosis not present

## 2024-03-22 NOTE — Telephone Encounter (Signed)
 Oral Oncology Patient Advocate Encounter  I received a call back from [facility] and they did receive the appeal informatio on 10/3 and they have 30 days to re-open the case and we will know something by 04/04/24.  [name], CPhT [place]  [facility] Specialty Pharmacy Services Oncology Pharmacy Patient Advocate Specialist [facility] [place] Phone: [phone]  Fax: [phone] [email or web address]

## 2024-03-23 DIAGNOSIS — M4802 Spinal stenosis, cervical region: Secondary | ICD-10-CM | POA: Diagnosis not present

## 2024-03-25 DIAGNOSIS — I69351 Hemiplegia and hemiparesis following cerebral infarction affecting right dominant side: Secondary | ICD-10-CM | POA: Diagnosis not present

## 2024-03-25 DIAGNOSIS — C61 Malignant neoplasm of prostate: Secondary | ICD-10-CM | POA: Diagnosis not present

## 2024-03-28 DIAGNOSIS — C61 Malignant neoplasm of prostate: Secondary | ICD-10-CM | POA: Diagnosis not present

## 2024-03-28 DIAGNOSIS — K219 Gastro-esophageal reflux disease without esophagitis: Secondary | ICD-10-CM | POA: Diagnosis not present

## 2024-03-28 DIAGNOSIS — N139 Obstructive and reflux uropathy, unspecified: Secondary | ICD-10-CM | POA: Diagnosis not present

## 2024-03-28 DIAGNOSIS — M6281 Muscle weakness (generalized): Secondary | ICD-10-CM | POA: Diagnosis not present

## 2024-03-28 DIAGNOSIS — I739 Peripheral vascular disease, unspecified: Secondary | ICD-10-CM | POA: Diagnosis not present

## 2024-03-28 DIAGNOSIS — M25461 Effusion, right knee: Secondary | ICD-10-CM | POA: Diagnosis not present

## 2024-03-28 DIAGNOSIS — E118 Type 2 diabetes mellitus with unspecified complications: Secondary | ICD-10-CM | POA: Diagnosis not present

## 2024-03-28 DIAGNOSIS — R1311 Dysphagia, oral phase: Secondary | ICD-10-CM | POA: Diagnosis not present

## 2024-03-28 DIAGNOSIS — F32A Depression, unspecified: Secondary | ICD-10-CM | POA: Diagnosis not present

## 2024-03-28 DIAGNOSIS — I6930 Unspecified sequelae of cerebral infarction: Secondary | ICD-10-CM | POA: Diagnosis not present

## 2024-03-28 DIAGNOSIS — G8191 Hemiplegia, unspecified affecting right dominant side: Secondary | ICD-10-CM | POA: Diagnosis not present

## 2024-03-28 DIAGNOSIS — I69320 Aphasia following cerebral infarction: Secondary | ICD-10-CM | POA: Diagnosis not present

## 2024-03-30 NOTE — Assessment & Plan Note (Addendum)
 On aspirin  and plavix  and statin

## 2024-03-30 NOTE — Progress Notes (Unsigned)
 [facility] OFFICE PROGRESS NOTE  Patient Care Team: Adele Song, MD as PCP - General Verlin Lonni BIRCH, MD as PCP - Cardiology (Cardiology) Verlin Lonni BIRCH, MD (Cardiology) Vertell Pont, RN as Oncology Nurse Navigator Pickenpack-Cousar, Fannie SAILOR, NP as Nurse Practitioner (Hospice and Palliative Medicine) Silvano Valrie SQUIBB, RN as Registered Nurse  [name] is a 73 y.o.male with history of CVAs with chronic right-sided weakness dysarthria and dysphagia, DM, HTN, PAD, and prostate cancer [facility].    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 GS 4+5 GG5 in 4 cores. PSA 150. PET with multifocal skeletal metastasis involving the pelvis, spine and LEFT ribs and a large RIGHT common iliac node. He was started on ADT in 01/2022 and AAP in 04/2022. PSA nadir in 10/2022 of 0.36 followed by rising PSA.    He is responding to darolutamide  very well.  Unfortunately, his insurance had declined approval.  Several appeal have been done.  Current diagnosis: mCRPC Initial diagnosis: mHSPC Germline testing: Negative Somatic testing: Tempus. PIK3CA p.E542K missense variant (exon 9) 1.1 %. DNMT3A missense variant 29%. AR missense variant 3.4%. TMB 7.2. VUS of 7 genes. Treatment: ADT/AAP 02/28/22 ADT 04/2022 AAP 10/2022 PSA nadir 0.36. followed by rising PSA 04/2023 PSA 1.38 10/2023 PSA 3.2 T<10 11/04/23 PSA 10.7 T<10 02/17/24 PSA 0.3 T 193  Assessment & Plan Prostate cancer (HCC) 7/24-01/06/24 SBRT for solitary metastasis Genetic testing neg somatic testing showed PIK3CA, DNMT3A and AR missense variant. TMB 7.2 Changed to darolutamide . Difficulty obtain insurance approval from his [employer]. Continue ADT at [facility], will receive Eligard  45mg  on 9/24  History of CVA (cerebrovascular accident) On aspirin  and plavix  and statin Essential hypertension, benign Continue medication and monitor at facility  No  orders of the defined types were placed in this encounter.    [name], MD  INTERVAL HISTORY: Patient returns for follow-up.  Oncology History  Prostate cancer (HCC)  11/2021 Initial Diagnosis   Prostate cancer (HCC) De novo with bone, LN metastases. PSA 150. GG5 4+5 in 4 cores. Rest were 4+3 lesions   02/26/2022 PET scan   PSMA PET 1. Intense activity throughout the prostate gland consistent with primary prostate adenocarcinoma. Prostate gland enlarged. Foley catheter in place. 2. Suspicion of local prostate cancer extension to the LEFT seminal vesicle. 3. Multifocal radiotracer avid skeletal metastasis involving the pelvis, spine and LEFT ribs. There are seven lesions identified. 4. Large RIGHT common iliac node with mild radiotracer activity is favored necrotic nodal metastasis. 5. Incidental finding of a RIGHT inguinal hernia which contains nonobstructed loop of small bowel.   10/09/2023 PET scan   PSMA PET 1. Persistent radiotracer activity the posterior RIGHT acetabulum with associated bone sclerosis. Other bone lesions have resolved in the interval. 2. Interval resolution of metastatic adenopathy in the pelvis. 3. No new adenopathy or visceral metastasis identified 4. Foley catheter within collapsed bladder.   01/19/2024 Cancer Staging   Staging form: Prostate, AJCC 8th Edition - Clinical: Stage IVB (cTX, cN1, cM1b, Grade Group: 5) - Signed by Chihuahua Pauletta JAYSON, MD on 01/19/2024 Histologic grading system: 5 grade system      PHYSICAL EXAMINATION: ECOG PERFORMANCE STATUS: {CHL ONC ECOG PS:[id number]}  There were no vitals filed for this visit. There were no vitals filed for this visit.  GENERAL: alert, no distress and comfortable SKIN: skin color normal and no jaundice or bruising or petechiae on exposed skin EYES: normal,  sclera clear OROPHARYNX: no exudate  NECK: No palpable mass LYMPH:  no palpable cervical, axillary lymphadenopathy  LUNGS: clear to  auscultation and no wheeze or rales with normal breathing effort HEART: regular rate & rhythm  ABDOMEN: abdomen soft, non-tender and nondistended. Musculoskeletal: no edema NEURO: no focal motor/sensory deficits  Relevant data reviewed during this visit included labs.  New labs ordered.

## 2024-03-30 NOTE — Assessment & Plan Note (Addendum)
 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 TMB 7.2 Changed to darolutamide . Difficulty obtain insurance approval from his [employer]. Continue ADT at [facility], will receive Eligard  45mg  on 9/24  Will see if Lab can be done at [facility].

## 2024-03-30 NOTE — Assessment & Plan Note (Addendum)
 Continue medication and monitor at facility

## 2024-03-31 ENCOUNTER — Inpatient Hospital Stay

## 2024-03-31 ENCOUNTER — Encounter: Payer: Self-pay | Admitting: Nurse Practitioner

## 2024-03-31 ENCOUNTER — Inpatient Hospital Stay (HOSPITAL_BASED_OUTPATIENT_CLINIC_OR_DEPARTMENT_OTHER): Admitting: Nurse Practitioner

## 2024-03-31 VITALS — BP 150/41 | HR 57 | Temp 97.2°F | Resp 18

## 2024-03-31 DIAGNOSIS — Z8673 Personal history of transient ischemic attack (TIA), and cerebral infarction without residual deficits: Secondary | ICD-10-CM

## 2024-03-31 DIAGNOSIS — C61 Malignant neoplasm of prostate: Secondary | ICD-10-CM | POA: Insufficient documentation

## 2024-03-31 DIAGNOSIS — R530 Neoplastic (malignant) related fatigue: Secondary | ICD-10-CM

## 2024-03-31 DIAGNOSIS — E291 Testicular hypofunction: Secondary | ICD-10-CM | POA: Diagnosis not present

## 2024-03-31 DIAGNOSIS — Z79818 Long term (current) use of other agents affecting estrogen receptors and estrogen levels: Secondary | ICD-10-CM | POA: Diagnosis not present

## 2024-03-31 DIAGNOSIS — Z515 Encounter for palliative care: Secondary | ICD-10-CM | POA: Diagnosis not present

## 2024-03-31 DIAGNOSIS — G893 Neoplasm related pain (acute) (chronic): Secondary | ICD-10-CM | POA: Diagnosis not present

## 2024-03-31 DIAGNOSIS — I10 Essential (primary) hypertension: Secondary | ICD-10-CM

## 2024-03-31 DIAGNOSIS — C7951 Secondary malignant neoplasm of bone: Secondary | ICD-10-CM | POA: Diagnosis not present

## 2024-03-31 LAB — CBC WITH DIFFERENTIAL (CANCER CENTER ONLY)
Abs Immature Granulocytes: 0.03 K/uL (ref 0.00–0.07)
Basophils Absolute: 0.1 K/uL (ref 0.0–0.1)
Basophils Relative: 1 %
Eosinophils Absolute: 0.4 K/uL (ref 0.0–0.5)
Eosinophils Relative: 4 %
HCT: 34.5 % — ABNORMAL LOW (ref 39.0–52.0)
Hemoglobin: 11 g/dL — ABNORMAL LOW (ref 13.0–17.0)
Immature Granulocytes: 0 %
Lymphocytes Relative: 22 %
Lymphs Abs: 2 K/uL (ref 0.7–4.0)
MCH: 27.4 pg (ref 26.0–34.0)
MCHC: 31.9 g/dL (ref 30.0–36.0)
MCV: 85.8 fL (ref 80.0–100.0)
Monocytes Absolute: 0.6 K/uL (ref 0.1–1.0)
Monocytes Relative: 7 %
Neutro Abs: 5.8 K/uL (ref 1.7–7.7)
Neutrophils Relative %: 66 %
Platelet Count: 180 K/uL (ref 150–400)
RBC: 4.02 MIL/uL — ABNORMAL LOW (ref 4.22–5.81)
RDW: 14.6 % (ref 11.5–15.5)
WBC Count: 9 K/uL (ref 4.0–10.5)
nRBC: 0 % (ref 0.0–0.2)

## 2024-03-31 LAB — CMP (CANCER CENTER ONLY)
ALT: 12 U/L (ref 0–44)
AST: 15 U/L (ref 15–41)
Albumin: 3.9 g/dL (ref 3.5–5.0)
Alkaline Phosphatase: 53 U/L (ref 38–126)
Anion gap: 7 (ref 5–15)
BUN: 21 mg/dL (ref 8–23)
CO2: 28 mmol/L (ref 22–32)
Calcium: 9.1 mg/dL (ref 8.9–10.3)
Chloride: 104 mmol/L (ref 98–111)
Creatinine: 1.15 mg/dL (ref 0.61–1.24)
GFR, Estimated: >60 mL/min (ref >60)
Glucose, Bld: 140 mg/dL — ABNORMAL HIGH (ref 70–99)
Potassium: 4.7 mmol/L (ref 3.5–5.1)
Sodium: 139 mmol/L (ref 135–145)
Total Bilirubin: 0.4 mg/dL (ref 0.0–1.2)
Total Protein: 6.8 g/dL (ref 6.5–8.1)

## 2024-03-31 LAB — PSA: Prostatic Specific Antigen: 0.04 ng/mL (ref 0.00–4.00)

## 2024-03-31 NOTE — Telephone Encounter (Signed)
  Medication Samples have been provided to the patient. Patient received on 03/31/2024.    Drug name: Nubeqa  (darolutamide )        Strength: 300mg          Qty: 120                       LOT: [id number]             Exp.Date: 04/01/2026   Dosing instructions: Take 2 tablets (600mg ) by mouth twice daily with food.    The patient has been instructed regarding the correct time, dose, and frequency of taking this medication, including desired effects and most common side effects.   Genova Kiner, PharmD Hematology/Oncology Clinical Pharmacist [facility] [phone]

## 2024-03-31 NOTE — Progress Notes (Signed)
 Palliative Medicine [facility]  Telephone:([phone]) [phone] Fax:(336) [phone]   Name: [name] Date: 03/31/2024 MRN: [id number]  DOB: 12-15-1950  Patient Care Team: Adele Song, MD as PCP - General Verlin Lonni BIRCH, MD as PCP - Cardiology (Cardiology) Verlin Lonni BIRCH, MD (Cardiology) Vertell Pont, RN as Oncology Nurse Navigator Pickenpack-Cousar, Fannie SAILOR, NP as Nurse Practitioner (Hospice and Palliative Medicine) Silvano Valrie SQUIBB, RN as Registered Nurse    INTERVAL HISTORY: [name] is a 73 y.o. 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 Medical problems include history of CVA with residual right-sided numbness, dysarthria, dysphagia, type 2 diabetes, hypertension, PVD, CHF, HLD, depression, and stage 2 CKD.   SOCIAL HISTORY:     reports that he has never smoked. He has never used smokeless tobacco. He reports that he does not drink alcohol and does not use drugs.  ADVANCE DIRECTIVES:  Currently full code, no MOST/HCPOA established, discussed DNR/DNI, will plan to follow up at next appt after he can discuss this with his children   CODE STATUS: Full code  PAST MEDICAL HISTORY: Past Medical History:  Diagnosis Date   CVA (cerebral infarction) 01/03/2008   MRI: Acute 1 x 1.5 cm infarction affecting the left side of the pons.   Diabetes mellitus    DVT (deep venous thrombosis) (HCC)    Hypertension    PAD (peripheral artery disease)    Prostate cancer (HCC)    Stroke (HCC)    2008    ALLERGIES:  is allergic to bovine (beef) protein-containing drug products, pork allergy, and shrimp flavor agent (non-screening).  MEDICATIONS:  Current Outpatient Medications  Medication Sig Dispense Refill   acetaminophen  (TYLENOL ) 325 MG tablet Take 2 tablets (650 mg total) by mouth every 6 (six) hours as needed for mild pain (pain score 1-3) (or Fever >/= 101).     ADMELOG SOLOSTAR 100 UNIT/ML KwikPen  Inject into the skin.     amlodipine -olmesartan  (AZOR ) 10-20 MG tablet Take 1 tablet by mouth daily. PLEASE SCHEDULE APPT PRIOR TO NEXT REFILL 90 tablet 0   aspirin  EC 81 MG tablet Take 1 tablet (81 mg total) by mouth daily. Swallow whole. 30 tablet 12   atorvastatin  (LIPITOR) 40 MG tablet Take 1 tablet (40 mg total) by mouth daily. 90 tablet 3   calcium  carbonate (OSCAL) 1500 (600 Ca) MG TABS tablet Take 1,200 mg of elemental calcium  by mouth daily with breakfast.     carvedilol  (COREG ) 12.5 MG tablet Take 1 tablet (12.5 mg total) by mouth 2 (two) times daily with a meal. 180 tablet 3   cholecalciferol (VITAMIN D3) 10 MCG/ML LIQD oral liquid Take 400 Units by mouth daily.     clopidogrel  (PLAVIX ) 75 MG tablet Take 1 tablet (75 mg total) by mouth daily. 90 tablet 2   darolutamide  (NUBEQA ) 300 MG tablet Take 2 tablets (600 mg total) by mouth 2 (two) times daily with a meal. 120 tablet 0   insulin  aspart (NOVOLOG ) 100 UNIT/ML injection Inject 0-5 Units into the skin at bedtime.     insulin  aspart (NOVOLOG ) 100 UNIT/ML injection Inject 0-6 Units into the skin 3 (three) times daily with meals.     Insulin  Glargine (BASAGLAR  KWIKPEN) 100 UNIT/ML Inject into the skin.     insulin  glargine-yfgn (SEMGLEE ) 100 UNIT/ML injection Inject 0.12 mLs (12 Units total) into the skin daily.     JARDIANCE  25 MG TABS tablet Take 25 mg by mouth daily.  metFORMIN  (GLUCOPHAGE ) 1000 MG tablet Take 1 tablet (1,000 mg total) by mouth 2 (two) times daily with a meal. 180 tablet 3   Nutritional Supplements (GLUCERNA 1.5 CAL/CARBSTEADY) LIQD Take 237 mLs by mouth 2 (two) times daily. 14220 mL 12   pantoprazole  (PROTONIX ) 20 MG tablet TAKE 1 TABLET BY MOUTH EVERY DAY 90 tablet 1   sertraline  (ZOLOFT ) 100 MG tablet TAKE 1 TABLET BY MOUTH EVERY DAY 90 tablet 2   tamsulosin  (FLOMAX ) 0.4 MG CAPS capsule Take 0.4 mg by mouth daily.     traMADol (ULTRAM) 50 MG tablet Take by mouth every 6 (six) hours as needed.     No current  facility-administered medications for this visit.    VITAL SIGNS: There were no vitals taken for this visit. There were no vitals filed for this visit.  Estimated body mass index is 32.15 kg/m as calculated from the following:   Height as of 02/16/24: 5' 7 (1.702 m).   Weight as of 02/22/24: 205 lb 4.8 oz (93.1 kg).  PERFORMANCE STATUS (ECOG) : 1 - Symptomatic but completely ambulatory  Physical Exam General: NAD, wheelchair  Cardiovascular: regular rate and rhythm Pulmonary: normal breathing pattern Extremities: no edema, no joint deformities Skin: no rashes Neurological: AAO x3  IMPRESSION: Discussed the use of AI scribe software for clinical note transcription with the patient, who gave verbal consent to proceed.  History of Present Illness [name] is a 73 year old male who presents for a follow-up. No acute distress. He is a resident at [facility]. Denies concerns of nausea, vomiting, constipation, or diarrhea. Denies pain.   He is experiencing issues with his living conditions, specifically related to the functionality of his bathroom. The toilet has not been working for months according to patient, leading to significant inconvenience and distress. Daughter states she is unaware of this situation and plans to follow-up at facility.   Mr. Marple states his biggest concern is difficulty sleeping due to noise in the facility, including a roommate's television and general facility noise. He has not had a good night's sleep since his admission. He has considered using caffeine pills to stay awake during the day due to his poor sleep quality, but is concerned about potential health impacts. We discussed talking with facility administration and use of ear phones or plugs.   His appetite is described as 'so-so,' and there is an ongoing effort to manage his nutrition with protein sources like tuna and chicken packets to supplement his meals.  There is a history of missed  medical appointments due to communication issues and scheduling conflicts. He has upcoming lab appointments and a doctor's appointment scheduled, but there is concern about his ability to attend these without assistance.  He wants to return home but acknowledges that he requires 24/7 care. His family member is unable to provide this level of care due to other responsibilities.   All questions answered and support provided.  Assessment & Plan Constipation and fecal incontinence Fecal incontinence due to non-functional toilet in the facility for four months, similar to previous experiences in another facility. - Address the non-functional toilet issue with facility management to ensure proper bathroom facilities are available.  Urology tube care and maintenance Urology tube requires monthly changes managed by [facility]. - Ensure appointments with [facility] for gastrostomy tube changes are scheduled and attended.  Insomnia related to facility environment Insomnia attributed to environmental noise, including a roommate's TV and general facility noise. Caffeine pills considered for daytime alertness  but not recommended due to cardiovascular risks. - Discuss with facility management the possibility of changing rooms to reduce noise. - Advise against the use of caffeine pills due to potential cardiovascular risks.  Nutritional concerns with protein intake Protein intake concerns addressed by incorporating tuna and chicken packets into meals to increase protein consumption. - Continue using tuna and chicken packets to supplement protein intake.  Will see patient back in 3-4 weeks. Sooner if needed.   Patient expressed understanding and was in agreement with this plan. He also understands that He can call the clinic at any time with any questions, concerns, or complaints.   Any controlled substances utilized were prescribed in the context of palliative care. PDMP has been reviewed.   Visit  consisted of counseling and education dealing with the complex and emotionally intense issues of symptom management and palliative care in the setting of serious and potentially life-threatening illness.  Levon Borer, AGPCNP-BC  Palliative Medicine Team/[facility]

## 2024-04-01 ENCOUNTER — Ambulatory Visit: Payer: Self-pay

## 2024-04-01 LAB — TESTOSTERONE: Testosterone: <3 ng/dL — ABNORMAL LOW (ref 264–916)

## 2024-04-03 DIAGNOSIS — M25572 Pain in left ankle and joints of left foot: Secondary | ICD-10-CM | POA: Diagnosis not present

## 2024-04-03 DIAGNOSIS — M79671 Pain in right foot: Secondary | ICD-10-CM | POA: Diagnosis not present

## 2024-04-04 DIAGNOSIS — M6281 Muscle weakness (generalized): Secondary | ICD-10-CM | POA: Diagnosis not present

## 2024-04-04 DIAGNOSIS — M25571 Pain in right ankle and joints of right foot: Secondary | ICD-10-CM | POA: Diagnosis not present

## 2024-04-05 ENCOUNTER — Other Ambulatory Visit (HOSPITAL_COMMUNITY): Payer: Self-pay

## 2024-04-05 DIAGNOSIS — R338 Other retention of urine: Secondary | ICD-10-CM | POA: Diagnosis not present

## 2024-04-05 DIAGNOSIS — I69351 Hemiplegia and hemiparesis following cerebral infarction affecting right dominant side: Secondary | ICD-10-CM | POA: Diagnosis not present

## 2024-04-05 DIAGNOSIS — C61 Malignant neoplasm of prostate: Secondary | ICD-10-CM | POA: Diagnosis not present

## 2024-04-05 DIAGNOSIS — E118 Type 2 diabetes mellitus with unspecified complications: Secondary | ICD-10-CM | POA: Diagnosis not present

## 2024-04-05 NOTE — Telephone Encounter (Signed)
 Oral Oncology Patient Advocate Encounter  Received notification that the request for 2nd level appeal for Nubeqa  has been denied due to it must be prescribed for a medically accepted indication outlined in the Medicare approved compendia The use of the requested drug to treat the noted condition is an off-label use. The Medicare-approved compendia do not contain any citations to support the use ot the requested drug, as prescribed, for the treatment of this condition.     [name], CPhT [place]  [facility] Oncology Pharmacy Patient Advocate Specialist [facility] [place] Phone: [phone]  Fax: [phone] [email or web address]

## 2024-04-06 DIAGNOSIS — C61 Malignant neoplasm of prostate: Secondary | ICD-10-CM | POA: Diagnosis not present

## 2024-04-06 DIAGNOSIS — I739 Peripheral vascular disease, unspecified: Secondary | ICD-10-CM | POA: Diagnosis not present

## 2024-04-06 DIAGNOSIS — M25461 Effusion, right knee: Secondary | ICD-10-CM | POA: Diagnosis not present

## 2024-04-06 DIAGNOSIS — F32A Depression, unspecified: Secondary | ICD-10-CM | POA: Diagnosis not present

## 2024-04-06 DIAGNOSIS — E118 Type 2 diabetes mellitus with unspecified complications: Secondary | ICD-10-CM | POA: Diagnosis not present

## 2024-04-06 DIAGNOSIS — K219 Gastro-esophageal reflux disease without esophagitis: Secondary | ICD-10-CM | POA: Diagnosis not present

## 2024-04-06 DIAGNOSIS — N139 Obstructive and reflux uropathy, unspecified: Secondary | ICD-10-CM | POA: Diagnosis not present

## 2024-04-06 DIAGNOSIS — G8191 Hemiplegia, unspecified affecting right dominant side: Secondary | ICD-10-CM | POA: Diagnosis not present

## 2024-04-06 DIAGNOSIS — M6281 Muscle weakness (generalized): Secondary | ICD-10-CM | POA: Diagnosis not present

## 2024-04-06 DIAGNOSIS — I6930 Unspecified sequelae of cerebral infarction: Secondary | ICD-10-CM | POA: Diagnosis not present

## 2024-04-06 DIAGNOSIS — I69320 Aphasia following cerebral infarction: Secondary | ICD-10-CM | POA: Diagnosis not present

## 2024-04-06 DIAGNOSIS — R1311 Dysphagia, oral phase: Secondary | ICD-10-CM | POA: Diagnosis not present

## 2024-04-07 DIAGNOSIS — M6281 Muscle weakness (generalized): Secondary | ICD-10-CM | POA: Diagnosis not present

## 2024-04-07 DIAGNOSIS — R2689 Other abnormalities of gait and mobility: Secondary | ICD-10-CM | POA: Diagnosis not present

## 2024-04-07 DIAGNOSIS — C61 Malignant neoplasm of prostate: Secondary | ICD-10-CM | POA: Diagnosis not present

## 2024-04-08 DIAGNOSIS — R339 Retention of urine, unspecified: Secondary | ICD-10-CM | POA: Diagnosis not present

## 2024-04-08 DIAGNOSIS — R2689 Other abnormalities of gait and mobility: Secondary | ICD-10-CM | POA: Diagnosis not present

## 2024-04-08 DIAGNOSIS — C61 Malignant neoplasm of prostate: Secondary | ICD-10-CM | POA: Diagnosis not present

## 2024-04-08 DIAGNOSIS — M6281 Muscle weakness (generalized): Secondary | ICD-10-CM | POA: Diagnosis not present

## 2024-04-09 DIAGNOSIS — R2689 Other abnormalities of gait and mobility: Secondary | ICD-10-CM | POA: Diagnosis not present

## 2024-04-09 DIAGNOSIS — M6281 Muscle weakness (generalized): Secondary | ICD-10-CM | POA: Diagnosis not present

## 2024-04-09 DIAGNOSIS — C61 Malignant neoplasm of prostate: Secondary | ICD-10-CM | POA: Diagnosis not present

## 2024-04-12 ENCOUNTER — Inpatient Hospital Stay

## 2024-04-12 DIAGNOSIS — R2689 Other abnormalities of gait and mobility: Secondary | ICD-10-CM | POA: Diagnosis not present

## 2024-04-12 DIAGNOSIS — C61 Malignant neoplasm of prostate: Secondary | ICD-10-CM | POA: Diagnosis not present

## 2024-04-12 DIAGNOSIS — M6281 Muscle weakness (generalized): Secondary | ICD-10-CM | POA: Diagnosis not present

## 2024-04-12 DIAGNOSIS — C7951 Secondary malignant neoplasm of bone: Secondary | ICD-10-CM | POA: Insufficient documentation

## 2024-04-12 DIAGNOSIS — C779 Secondary and unspecified malignant neoplasm of lymph node, unspecified: Secondary | ICD-10-CM | POA: Insufficient documentation

## 2024-04-12 NOTE — Progress Notes (Signed)
 RN followed up with [facility] to inquire about labs being scheduled with upcoming cath exchange.   Patient is now scheduled for cath exchange on 11/19 at 8:30 a.m.  Reviewing if labs can be done same day.   RN notified [facility] on patients appointment at [facility].

## 2024-04-13 DIAGNOSIS — M6281 Muscle weakness (generalized): Secondary | ICD-10-CM | POA: Diagnosis not present

## 2024-04-13 DIAGNOSIS — C61 Malignant neoplasm of prostate: Secondary | ICD-10-CM | POA: Diagnosis not present

## 2024-04-13 DIAGNOSIS — R2689 Other abnormalities of gait and mobility: Secondary | ICD-10-CM | POA: Diagnosis not present

## 2024-04-14 DIAGNOSIS — E118 Type 2 diabetes mellitus with unspecified complications: Secondary | ICD-10-CM | POA: Diagnosis not present

## 2024-04-14 DIAGNOSIS — M25571 Pain in right ankle and joints of right foot: Secondary | ICD-10-CM | POA: Diagnosis not present

## 2024-04-14 DIAGNOSIS — C61 Malignant neoplasm of prostate: Secondary | ICD-10-CM | POA: Diagnosis not present

## 2024-04-14 DIAGNOSIS — K219 Gastro-esophageal reflux disease without esophagitis: Secondary | ICD-10-CM | POA: Diagnosis not present

## 2024-04-14 DIAGNOSIS — Z794 Long term (current) use of insulin: Secondary | ICD-10-CM | POA: Diagnosis not present

## 2024-04-14 DIAGNOSIS — E785 Hyperlipidemia, unspecified: Secondary | ICD-10-CM | POA: Diagnosis not present

## 2024-04-14 DIAGNOSIS — I69351 Hemiplegia and hemiparesis following cerebral infarction affecting right dominant side: Secondary | ICD-10-CM | POA: Diagnosis not present

## 2024-04-14 DIAGNOSIS — R2689 Other abnormalities of gait and mobility: Secondary | ICD-10-CM | POA: Diagnosis not present

## 2024-04-14 DIAGNOSIS — M6281 Muscle weakness (generalized): Secondary | ICD-10-CM | POA: Diagnosis not present

## 2024-04-14 DIAGNOSIS — F32A Depression, unspecified: Secondary | ICD-10-CM | POA: Diagnosis not present

## 2024-04-15 DIAGNOSIS — C61 Malignant neoplasm of prostate: Secondary | ICD-10-CM | POA: Diagnosis not present

## 2024-04-15 DIAGNOSIS — R2689 Other abnormalities of gait and mobility: Secondary | ICD-10-CM | POA: Diagnosis not present

## 2024-04-15 DIAGNOSIS — M6281 Muscle weakness (generalized): Secondary | ICD-10-CM | POA: Diagnosis not present

## 2024-04-16 ENCOUNTER — Emergency Department (HOSPITAL_COMMUNITY)

## 2024-04-16 ENCOUNTER — Other Ambulatory Visit: Payer: Self-pay

## 2024-04-16 ENCOUNTER — Encounter (HOSPITAL_COMMUNITY): Payer: Self-pay

## 2024-04-16 ENCOUNTER — Emergency Department (HOSPITAL_COMMUNITY)
Admission: EM | Admit: 2024-04-16 | Discharge: 2024-04-16 | Disposition: A | Discharge status: Skilled Nursing Facility | Source: Skilled Nursing Facility | Attending: Emergency Medicine | Admitting: Emergency Medicine

## 2024-04-16 DIAGNOSIS — S3993XA Unspecified injury of pelvis, initial encounter: Secondary | ICD-10-CM | POA: Diagnosis not present

## 2024-04-16 DIAGNOSIS — S0990XA Unspecified injury of head, initial encounter: Secondary | ICD-10-CM | POA: Diagnosis not present

## 2024-04-16 DIAGNOSIS — Z7901 Long term (current) use of anticoagulants: Secondary | ICD-10-CM | POA: Diagnosis not present

## 2024-04-16 DIAGNOSIS — C7951 Secondary malignant neoplasm of bone: Secondary | ICD-10-CM | POA: Diagnosis not present

## 2024-04-16 DIAGNOSIS — S299XXA Unspecified injury of thorax, initial encounter: Secondary | ICD-10-CM | POA: Diagnosis not present

## 2024-04-16 DIAGNOSIS — Z7401 Bed confinement status: Secondary | ICD-10-CM | POA: Diagnosis not present

## 2024-04-16 DIAGNOSIS — I110 Hypertensive heart disease with heart failure: Secondary | ICD-10-CM | POA: Diagnosis not present

## 2024-04-16 DIAGNOSIS — Z7984 Long term (current) use of oral hypoglycemic drugs: Secondary | ICD-10-CM | POA: Insufficient documentation

## 2024-04-16 DIAGNOSIS — Z8546 Personal history of malignant neoplasm of prostate: Secondary | ICD-10-CM | POA: Insufficient documentation

## 2024-04-16 DIAGNOSIS — Z7902 Long term (current) use of antithrombotics/antiplatelets: Secondary | ICD-10-CM | POA: Diagnosis not present

## 2024-04-16 DIAGNOSIS — Y92129 Unspecified place in nursing home as the place of occurrence of the external cause: Secondary | ICD-10-CM | POA: Diagnosis not present

## 2024-04-16 DIAGNOSIS — I959 Hypotension, unspecified: Secondary | ICD-10-CM | POA: Diagnosis not present

## 2024-04-16 DIAGNOSIS — S199XXA Unspecified injury of neck, initial encounter: Secondary | ICD-10-CM | POA: Diagnosis not present

## 2024-04-16 DIAGNOSIS — I10 Essential (primary) hypertension: Secondary | ICD-10-CM | POA: Diagnosis not present

## 2024-04-16 DIAGNOSIS — W19XXXA Unspecified fall, initial encounter: Secondary | ICD-10-CM | POA: Diagnosis not present

## 2024-04-16 DIAGNOSIS — Z7982 Long term (current) use of aspirin: Secondary | ICD-10-CM | POA: Diagnosis not present

## 2024-04-16 DIAGNOSIS — Z79899 Other long term (current) drug therapy: Secondary | ICD-10-CM | POA: Diagnosis not present

## 2024-04-16 DIAGNOSIS — E119 Type 2 diabetes mellitus without complications: Secondary | ICD-10-CM | POA: Diagnosis not present

## 2024-04-16 DIAGNOSIS — C61 Malignant neoplasm of prostate: Secondary | ICD-10-CM | POA: Diagnosis not present

## 2024-04-16 DIAGNOSIS — Z794 Long term (current) use of insulin: Secondary | ICD-10-CM | POA: Insufficient documentation

## 2024-04-16 DIAGNOSIS — I509 Heart failure, unspecified: Secondary | ICD-10-CM | POA: Insufficient documentation

## 2024-04-16 DIAGNOSIS — M6281 Muscle weakness (generalized): Secondary | ICD-10-CM | POA: Diagnosis not present

## 2024-04-16 DIAGNOSIS — W01198A Fall on same level from slipping, tripping and stumbling with subsequent striking against other object, initial encounter: Secondary | ICD-10-CM | POA: Insufficient documentation

## 2024-04-16 DIAGNOSIS — R2689 Other abnormalities of gait and mobility: Secondary | ICD-10-CM | POA: Diagnosis not present

## 2024-04-16 NOTE — Progress Notes (Signed)
   04/16/24 1227  Spiritual Encounters  Type of Visit Initial  Care provided to: Patient  Conversation partners present during encounter Nurse  Referral source Code page  Reason for visit Trauma  OnCall Visit No   This chaplain visited the patient in ED [facility] after hitting his head in a fall and transported to ED. Patient was being tended by medical staff. Patient was not in a position to have conversation.

## 2024-04-16 NOTE — Discharge Instructions (Addendum)
 Your trauma imaging was negative for acute injury.

## 2024-04-16 NOTE — ED Notes (Signed)
 [facility] called, no eta available, at least 7 ahead.

## 2024-04-16 NOTE — ED Triage Notes (Signed)
 Pt bib ems from [facility]; pt states he fell and hit head while transferring to wheelchair; no obvious injury, no pain; not on blood thinners; denies loc; pt mentating at baseline; 150/90, P 61, 94 % RA

## 2024-04-16 NOTE — Progress Notes (Signed)
 Orthopedic Tech Progress Note Patient Details:  [name] 05/22/1951 [id number] Level 2 Trauma  Patient ID: [name], male   DOB: 12/07/1950, 73 y.o.   MRN: [id number]  [name] 04/16/2024, 12:00 PM

## 2024-04-16 NOTE — ED Notes (Addendum)
 Patient transported to CT

## 2024-04-16 NOTE — ED Provider Triage Note (Addendum)
 Emergency Medicine Provider Triage Evaluation Note  [name] , a 73 y.o. male  was evaluated in triage.  Pt complains of a fall.  The patient states that he takes Plavix , last took the medication this morning.  He states that he was try to sit down and slipped striking the back of his head.  He denies any loss of consciousness.  Level 2 trauma activated from EMS triage.  Review of Systems  Positive: Head trauma Negative: CP, SOB  Physical Exam  BP (!) 147/63   Pulse (!) 55   Temp 97.8 F (36.6 C) (Oral)   Resp 18   SpO2 98%  Gen:   Awake, no distress   Resp:  Normal effort  MSK:   Mo obvious injury, pt right foot in a moon boot  Medical Decision Making  Medically screening exam initiated at 11:40 AM.  Appropriate orders placed.  Shahid Flori Landrigan was informed that the remainder of the evaluation will be completed by another provider, this initial triage assessment does not replace that evaluation, and the importance of remaining in the ED until their evaluation is complete.  Level 2 trauma activated from EMS triage, charge informed of need for a room.       Jerrol Agent, MD 04/16/24 1154

## 2024-04-16 NOTE — ED Provider Notes (Signed)
 [place] EMERGENCY DEPARTMENT AT [facility] Provider Note   CSN: [id number] Arrival date & time: 04/16/24  1132     Patient presents with: Level 2- FOT   [name] is a 73 y.o. male.   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  The patient states that he was trying to transfer from a chair when a moon boot on his right leg caused him to trip falling backwards and striking his head.  The patient states that he is on Plavix  and last took the medication this morning.  The patient arrives as a level 2 trauma after striking his head.  He denies loss of consciousness.  He denies any other injuries or complaints.  He arrives GCS 15, ABC intact.  Prior to Admission medications   Medication Sig Start Date End Date Taking? Authorizing Provider  acetaminophen  (TYLENOL ) 325 MG tablet Take 2 tablets (650 mg total) by mouth every 6 (six) hours as needed for mild pain (pain score 1-3) (or Fever >/= 101). 10/12/23   Danton Reyes DASEN, MD  ADMELOG SOLOSTAR 100 UNIT/ML KwikPen Inject into the skin. 11/21/23   [provider]  amlodipine -olmesartan  (AZOR ) 10-20 MG tablet Take 1 tablet by mouth daily. PLEASE SCHEDULE APPT PRIOR TO NEXT REFILL 10/27/23   Hindel, Rea, MD  aspirin  EC 81 MG tablet Take 1 tablet (81 mg total) by mouth daily. Swallow whole. 08/13/23   Lanis Fonda FORBES, MD  atorvastatin  (LIPITOR) 40 MG tablet Take 1 tablet (40 mg total) by mouth daily. 08/04/22   Ganta, Anupa, DO  calcium  carbonate (OSCAL) 1500 (600 Ca) MG TABS tablet Take 1,200 mg of elemental calcium  by mouth daily with breakfast.    [provider]  carvedilol  (COREG ) 12.5 MG tablet Take 1 tablet (12.5 mg total) by mouth 2 (two) times daily with a meal. 03/18/23 02/22/24   Hindel, Rea, MD  cholecalciferol (VITAMIN D3) 10 MCG/ML LIQD oral liquid Take 400 Units by mouth daily.    [provider]  clopidogrel  (PLAVIX ) 75 MG tablet Take 1 tablet (75 mg total) by mouth daily. 02/05/23   Adele Rea, MD  darolutamide  (NUBEQA ) 300 MG tablet Take 2 tablets (600 mg total) by mouth 2 (two) times daily with a meal. 02/09/24   Tina Pauletta BROCKS, MD  insulin  aspart (NOVOLOG ) 100 UNIT/ML injection Inject 0-5 Units into the skin at bedtime. 10/12/23   Danton Reyes DASEN, MD  insulin  aspart (NOVOLOG ) 100 UNIT/ML injection Inject 0-6 Units into the skin 3 (three) times daily with meals. 10/12/23   Danton Reyes DASEN, MD  Insulin  Glargine (BASAGLAR  KWIKPEN) 100 UNIT/ML Inject into the skin. 01/19/24   [provider]  insulin  glargine-yfgn (SEMGLEE ) 100 UNIT/ML injection Inject 0.12 mLs (12 Units total) into the skin daily. 10/13/23   Danton Reyes DASEN, MD  JARDIANCE  25 MG TABS tablet Take 25 mg by mouth daily. 09/28/23   [provider]  metFORMIN  (GLUCOPHAGE ) 1000 MG tablet Take 1 tablet (1,000 mg total) by mouth 2 (two) times daily with a meal. 02/17/23   Hindel, Leah, MD  Nutritional Supplements (GLUCERNA 1.5 CAL/CARBSTEADY) LIQD Take 237 mLs by mouth 2 (two) times daily. 12/08/23   Cleotilde Laymon HERO, NP  pantoprazole  (PROTONIX ) 20 MG tablet TAKE 1 TABLET BY MOUTH EVERY DAY  10/20/23   Hindel, Rea, MD  sertraline  (ZOLOFT ) 100 MG tablet TAKE 1 TABLET BY MOUTH EVERY DAY 10/30/23   Hindel, Leah, MD  tamsulosin  (FLOMAX ) 0.4 MG CAPS capsule Take 0.4 mg by mouth daily. 05/07/23   [provider]  traMADol (ULTRAM) 50 MG tablet Take by mouth every 6 (six) hours as needed.    [provider]    Allergies: Bovine (beef) protein-containing drug products, Pork allergy, and Shrimp flavor agent (non-screening)    Review of Systems  Unable to perform ROS: Acuity of condition    Updated Vital Signs BP (!) 135/57   Pulse 62   Temp 97.8 F (36.6 C) (Oral)    Resp (!) 24   Ht 5' 7 (1.702 m)   Wt 93 kg   SpO2 100%   BMI 32.11 kg/m   Physical Exam Vitals and nursing note reviewed.  Constitutional:      Appearance: He is well-developed.     Comments: GCS 15, ABC intact  HENT:     Head: Normocephalic.  Eyes:     Conjunctiva/sclera: Conjunctivae normal.  Neck:     Comments: No midline tenderness to palpation of the cervical spine. ROM intact. Cardiovascular:     Rate and Rhythm: Normal rate and regular rhythm.     Heart sounds: No murmur heard. Pulmonary:     Effort: Pulmonary effort is normal. No respiratory distress.     Breath sounds: Normal breath sounds.  Chest:     Comments: Chest wall stable and non-tender to AP and lateral compression. Clavicles stable and non-tender to AP compression Abdominal:     Palpations: Abdomen is soft.     Tenderness: There is no abdominal tenderness.     Comments: Pelvis stable to lateral compression.  Genitourinary:    Comments: Foley catheter in place Musculoskeletal:     Cervical back: Neck supple.     Comments: No midline tenderness to palpation of the thoracic or lumbar spine. Extremities atraumatic with intact ROM, right foot moon boot in place  Skin:    General: Skin is warm and dry.  Neurological:     Mental Status: He is alert.     Comments: Right sided hemiplegia present with contractures (baseline from previous stroke)     (all labs ordered are listed, but only abnormal results are displayed) Labs Reviewed - No data to display  EKG: None  Radiology: CT HEAD WO CONTRAST Result Date: 04/16/2024 CLINICAL DATA:  Head and neck trauma. EXAM: CT HEAD WITHOUT CONTRAST CT CERVICAL SPINE WITHOUT CONTRAST TECHNIQUE: Multidetector CT imaging of the head and cervical spine was performed following the standard protocol without intravenous contrast. Multiplanar CT image reconstructions of the cervical spine were also generated. RADIATION DOSE REDUCTION: This exam was performed according to  the departmental dose-optimization program which includes automated exposure control, adjustment of the mA and/or kV according to patient size and/or use of iterative reconstruction technique. COMPARISON:  Head CT 10/09/2023 and head/cervical spine CT 10/02/2023 FINDINGS: CT HEAD FINDINGS Brain: Ventricles, cisterns and other CSF spaces are within normal. Stable prominence of the cisterna magna. Mild to moderate chronic ischemic microvascular disease. No mass, mass effect, shift of midline structures or acute hemorrhage. No evidence of acute infarction. Vascular: No hyperdense vessel or unexpected calcification. Skull: No skull fracture. Sinuses/Orbits: Orbits are normal. Paranasal sinuses are well developed and demonstrate mucosal membrane thickening over the maxillary sinuses with minimal opacification over the ethmoid air cells. Other: None. CT CERVICAL SPINE FINDINGS Alignment:  Mild reversal of the normal cervical lordosis. No posttraumatic subluxation. Skull base and vertebrae: Mild spondylosis throughout the cervical spine to include uncovertebral joint spurring and facet arthropathy. Vertebral body heights are maintained. No significant neural foraminal narrowing. No acute fracture. Soft tissues and spinal canal: Prevertebral soft tissues are normal. Mild canal stenosis at the C5-6 level due to broad-based disc bulge. Disc levels: Mild disc space narrowing at the C5-6 level. Suggestion of mild broad-based disc bulge from the C2-3 level to the C6-7 level. Upper chest: No acute findings. Other: None. IMPRESSION: 1. No acute brain injury. 2. Mild to moderate chronic ischemic microvascular disease. 3. No acute cervical spine injury. 4. Mild spondylosis throughout the cervical spine with evidence of multilevel disc disease. Mild canal stenosis at the C5-6 level due to broad-based disc bulge. Electronically Signed   By: Toribio Agreste M.D.   On: 04/16/2024 13:22   CT CERVICAL SPINE WO CONTRAST Result Date:  04/16/2024 CLINICAL DATA:  Head and neck trauma. EXAM: CT HEAD WITHOUT CONTRAST CT CERVICAL SPINE WITHOUT CONTRAST TECHNIQUE: Multidetector CT imaging of the head and cervical spine was performed following the standard protocol without intravenous contrast. Multiplanar CT image reconstructions of the cervical spine were also generated. RADIATION DOSE REDUCTION: This exam was performed according to the departmental dose-optimization program which includes automated exposure control, adjustment of the mA and/or kV according to patient size and/or use of iterative reconstruction technique. COMPARISON:  Head CT 10/09/2023 and head/cervical spine CT 10/02/2023 FINDINGS: CT HEAD FINDINGS Brain: Ventricles, cisterns and other CSF spaces are within normal. Stable prominence of the cisterna magna. Mild to moderate chronic ischemic microvascular disease. No mass, mass effect, shift of midline structures or acute hemorrhage. No evidence of acute infarction. Vascular: No hyperdense vessel or unexpected calcification. Skull: No skull fracture. Sinuses/Orbits: Orbits are normal. Paranasal sinuses are well developed and demonstrate mucosal membrane thickening over the maxillary sinuses with minimal opacification over the ethmoid air cells. Other: None. CT CERVICAL SPINE FINDINGS Alignment: Mild reversal of the normal cervical lordosis. No posttraumatic subluxation. Skull base and vertebrae: Mild spondylosis throughout the cervical spine to include uncovertebral joint spurring and facet arthropathy. Vertebral body heights are maintained. No significant neural foraminal narrowing. No acute fracture. Soft tissues and spinal canal: Prevertebral soft tissues are normal. Mild canal stenosis at the C5-6 level due to broad-based disc bulge. Disc levels: Mild disc space narrowing at the C5-6 level. Suggestion of mild broad-based disc bulge from the C2-3 level to the C6-7 level. Upper chest: No acute findings. Other: None. IMPRESSION: 1.  No acute brain injury. 2. Mild to moderate chronic ischemic microvascular disease. 3. No acute cervical spine injury. 4. Mild spondylosis throughout the cervical spine with evidence of multilevel disc disease. Mild canal stenosis at the C5-6 level due to broad-based disc bulge. Electronically Signed   By: Toribio Agreste M.D.   On: 04/16/2024 13:22   DG Pelvis Portable Result Date: 04/16/2024 CLINICAL DATA:  Trauma.  History of prostate cancer EXAM: PORTABLE PELVIS 1-2 VIEWS COMPARISON:  PET-CT 10/09/2023 FINDINGS: Bones are diffusely demineralized. Rightward patient rotation. No definite fracture. There is diffuse sclerosis in the right hemipelvis involving the innominate bone, similar to prior PET-CT. SI joints and symphysis pubis unremarkable. No evidence for femoral head dislocation. IMPRESSION: 1. No acute bony findings although study is limited by technique and patient rotation. 2. Diffuse sclerosis in the right hemipelvis involving the innominate bone, similar to prior PET-CT and compatible with metastatic disease. Electronically Signed  By: Camellia Candle M.D.   On: 04/16/2024 12:11   DG Chest Port 1 View Result Date: 04/16/2024 CLINICAL DATA:  Trauma. EXAM: PORTABLE CHEST 1 VIEW COMPARISON:  10/09/2023 FINDINGS: Low volume rotated film. The lungs are clear without focal pneumonia, edema, pneumothorax or pleural effusion. Cardiopericardial silhouette is at upper limits of normal for size. No acute bony abnormality. IMPRESSION: Low volume rotated film without acute cardiopulmonary findings. Electronically Signed   By: Camellia Candle M.D.   On: 04/16/2024 12:09     Procedures   Medications Ordered in the ED - No data to display                                  Medical Decision Making Amount and/or Complexity of Data Reviewed Radiology: ordered.    73 year old male with medical history significant for DM2, HTN, PAD status post DES and angioplasty, CVA on Eliquis with residual spastic  hemiparesis, physical deconditioning, CHF, prostate cancer who comes from [facility] to the emergency department after a fall.  The patient states that he was trying to transfer from a chair when a moon boot on his right leg caused him to trip falling backwards and striking his head.  The patient states that he is on Plavix  and last took the medication this morning.  The patient arrives as a level 2 trauma after striking his head.  He denies loss of consciousness.  He denies any other injuries or complaints.  He arrives GCS 15, ABC intact.  On arrival, the patient was afebrile, mildly bradycardic heart rate 55, not tachypneic RR 18, BP 147/63, saturating 90% on room air.  On exam the patient had no evidence of traumatic injury.  Given the patient report of falling backwards and striking his head, will obtain screening imaging to include CT head, cervical spine, x-ray of the chest and pelvis.  CT head and cervical spine: IMPRESSION:  1. No acute brain injury.  2. Mild to moderate chronic ischemic microvascular disease.  3. No acute cervical spine injury.  4. Mild spondylosis throughout the cervical spine with evidence of  multilevel disc disease. Mild canal stenosis at the C5-6 level due  to broad-based disc bulge.   CXR: IMPRESSION:  Low volume rotated film without acute cardiopulmonary findings.   Pelvis XR: IMPRESSION:  1. No acute bony findings although study is limited by technique and  patient rotation.  2. Diffuse sclerosis in the right hemipelvis involving the  innominate bone, similar to prior PET-CT and compatible with  metastatic disease.    Spoke with the patient's daughter to provide an update, [name] Gamero, pt cleared from trauma standpoint, at his neurologic baseline. Pt with known metastatic prostate cancer, follows outpatient with oncology and palliative. Stable for DC back to his facility. Will contact [facility].       Final diagnoses:  Fall, initial encounter   Prostate cancer metastatic to bone [facility])    ED Discharge Orders     None          Jerrol Agent, MD 04/16/24 1400

## 2024-04-17 DIAGNOSIS — C61 Malignant neoplasm of prostate: Secondary | ICD-10-CM | POA: Diagnosis not present

## 2024-04-17 DIAGNOSIS — M6281 Muscle weakness (generalized): Secondary | ICD-10-CM | POA: Diagnosis not present

## 2024-04-17 DIAGNOSIS — R2689 Other abnormalities of gait and mobility: Secondary | ICD-10-CM | POA: Diagnosis not present

## 2024-04-18 DIAGNOSIS — M6281 Muscle weakness (generalized): Secondary | ICD-10-CM | POA: Diagnosis not present

## 2024-04-18 DIAGNOSIS — R2689 Other abnormalities of gait and mobility: Secondary | ICD-10-CM | POA: Diagnosis not present

## 2024-04-18 DIAGNOSIS — C61 Malignant neoplasm of prostate: Secondary | ICD-10-CM | POA: Diagnosis not present

## 2024-04-19 ENCOUNTER — Inpatient Hospital Stay

## 2024-04-19 VITALS — BP 184/72 | HR 56 | Temp 97.3°F | Resp 18 | Wt 212.5 lb

## 2024-04-19 DIAGNOSIS — C7951 Secondary malignant neoplasm of bone: Secondary | ICD-10-CM | POA: Diagnosis not present

## 2024-04-19 DIAGNOSIS — I69351 Hemiplegia and hemiparesis following cerebral infarction affecting right dominant side: Secondary | ICD-10-CM | POA: Diagnosis not present

## 2024-04-19 DIAGNOSIS — Z8673 Personal history of transient ischemic attack (TIA), and cerebral infarction without residual deficits: Secondary | ICD-10-CM

## 2024-04-19 DIAGNOSIS — W19XXXS Unspecified fall, sequela: Secondary | ICD-10-CM

## 2024-04-19 DIAGNOSIS — C61 Malignant neoplasm of prostate: Secondary | ICD-10-CM | POA: Diagnosis not present

## 2024-04-19 DIAGNOSIS — Z191 Hormone sensitive malignancy status: Secondary | ICD-10-CM | POA: Diagnosis not present

## 2024-04-19 DIAGNOSIS — C779 Secondary and unspecified malignant neoplasm of lymph node, unspecified: Secondary | ICD-10-CM | POA: Diagnosis not present

## 2024-04-19 DIAGNOSIS — E118 Type 2 diabetes mellitus with unspecified complications: Secondary | ICD-10-CM | POA: Diagnosis not present

## 2024-04-19 NOTE — Telephone Encounter (Signed)
 Medication Samples have been provided to the patient. Patient received on 04/19/2024.    Drug name: Nubeqa  (darolutamide )        Strength: 300mg          Qty: 120                       LOT: [id number]             Exp.Date: 04/01/2026   Dosing instructions: Take 2 tablets (600mg ) by mouth twice daily with food.    The patient has been instructed regarding the correct time, dose, and frequency of taking this medication, including desired effects and most common side effects.   Mackinley Cassaday, PharmD Hematology/Oncology Clinical Pharmacist [facility] [phone]

## 2024-04-19 NOTE — Assessment & Plan Note (Addendum)
 He had a fall this month.  Discuss monitor closely. If recurrent fall may need to stop prostate cancer treatment.

## 2024-04-19 NOTE — Assessment & Plan Note (Addendum)
 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 TMB 7.2 Changed to darolutamide . Difficulty obtain insurance approval from his [employer]. Continue ADT at [facility], will receive Eligard  45mg  on 9/24  Will see if Lab can be done at [facility].

## 2024-04-19 NOTE — Progress Notes (Signed)
 [facility] OFFICE PROGRESS NOTE  Patient Care Team: Adele Song, MD as PCP - General (Family Medicine) Verlin Lonni BIRCH, MD as PCP - Cardiology (Cardiology) Verlin Lonni BIRCH, MD (Cardiology) Vertell Pont, RN as Oncology Nurse Navigator Pickenpack-Cousar, Fannie SAILOR, NP as Nurse Practitioner (Hospice and Palliative Medicine) Silvano Valrie SQUIBB, RN as Registered Nurse  [name] is a 72 y.o.male with history of CVAs with chronic right-sided weakness dysarthria and dysphagia, DM, HTN, PAD, and prostate cancer [facility].    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 GS 4+5 GG5 in 4 cores. PSA 150. PET with multifocal skeletal metastasis involving the pelvis, spine and LEFT ribs and a large RIGHT common iliac node. He was started on ADT in 01/2022 and AAP in 04/2022. PSA nadir in 10/2022 of 0.36 followed by rising PSA.    He is responding to darolutamide  very well.  Unfortunately, his insurance had declined approval.  Several appeal have been done.   Fall appeared to be incidental. Discuss if recurrent may need to reconsider about treating prostate cancer and pursue comfort measures. He has very limited PS.  Current diagnosis: mCRPC Initial diagnosis: mHSPC Germline testing: Negative Somatic testing: Tempus. PIK3CA p.[id number] missense variant (exon 9) 1.1 %. DNMT3A missense variant 29%. AR missense variant 3.4%. TMB 7.2. VUS of 7 genes. Treatment: ADT/AAP 02/28/22 ADT 04/2022 AAP 10/2022 PSA nadir 0.36. followed by rising PSA 04/2023 PSA 1.38 10/2023 PSA 3.2 T<10 11/04/23 PSA 10.7 T<10 12/24/23-01/06/24 SBRT Pelvis. 02/17/24 PSA 0.3 T 193 03/31/24 PSA 0.04. Assessment & Plan Prostate cancer (HCC) 7/24-01/06/24 SBRT for solitary metastasis Genetic testing neg somatic testing showed PIK3CA, DNMT3A and AR missense variant. TMB 7.2 Changed to darolutamide . Difficulty obtain insurance approval from  his [employer]. Continue ADT at [facility], will receive Eligard  45mg  on 9/24  Will see if Lab can be done at [facility]. Fall, sequela He had a fall this month.  Discuss monitor closely. If recurrent fall may need to stop prostate cancer treatment.  History of CVA (cerebrovascular accident) On aspirin  and plavix  and statin No changes.    Kyle JAYSON Chihuahua, MD  INTERVAL HISTORY: Patient returns for follow-up. No chest pain, no stroke like symptoms. No able to walk on his own.   He stood and suddenly right ankle gave out.   Oncology History  Prostate cancer (HCC)  11/2021 Initial Diagnosis   Prostate cancer (HCC) De novo with bone, LN metastases. PSA 150. GG5 4+5 in 4 cores. Rest were 4+3 lesions   02/26/2022 PET scan   PSMA PET 1. Intense activity throughout the prostate gland consistent with primary prostate adenocarcinoma. Prostate gland enlarged. Foley catheter in place. 2. Suspicion of local prostate cancer extension to the LEFT seminal vesicle. 3. Multifocal radiotracer avid skeletal metastasis involving the pelvis, spine and LEFT ribs. There are seven lesions identified. 4. Large RIGHT common iliac node with mild radiotracer activity is favored necrotic nodal metastasis. 5. Incidental finding of a RIGHT inguinal hernia which contains nonobstructed loop of small bowel.   10/09/2023 PET scan   PSMA PET 1. Persistent radiotracer activity the posterior RIGHT acetabulum with associated bone sclerosis. Other bone lesions have resolved in the interval. 2. Interval resolution of metastatic adenopathy in the pelvis. 3. No new adenopathy or visceral metastasis identified 4. Foley catheter within collapsed bladder.   01/19/2024 Cancer Staging   Staging form: Prostate, AJCC 8th Edition - Clinical: Stage IVB (cTX, cN1, cM1b,  Grade Group: 5) - Signed by Tina Kyle BROCKS, MD on 01/19/2024 Histologic grading system: 5 grade system      PHYSICAL EXAMINATION: ECOG PERFORMANCE STATUS: 3  Vitals:    04/19/24 1420 04/19/24 1423  BP: (!) 179/76 (!) 184/72  Pulse:    Resp:    Temp:    SpO2:     Filed Weights   04/19/24 1416  Weight: 212 lb 8 oz (96.4 kg)    GENERAL: alert, no distress and comfortable LUNGS: clear to auscultation and no wheeze or rales with normal breathing effort HEART: regular rate & rhythm  ABDOMEN: abdomen soft, non-tender and nondistended.  Relevant data reviewed during this visit included labs.

## 2024-04-19 NOTE — Assessment & Plan Note (Addendum)
 On aspirin  and plavix  and statin No changes.

## 2024-04-20 DIAGNOSIS — C61 Malignant neoplasm of prostate: Secondary | ICD-10-CM | POA: Diagnosis not present

## 2024-04-20 DIAGNOSIS — F32A Depression, unspecified: Secondary | ICD-10-CM | POA: Diagnosis not present

## 2024-04-20 DIAGNOSIS — R338 Other retention of urine: Secondary | ICD-10-CM | POA: Diagnosis not present

## 2024-04-20 DIAGNOSIS — Z79899 Other long term (current) drug therapy: Secondary | ICD-10-CM | POA: Diagnosis not present

## 2024-04-22 DIAGNOSIS — C61 Malignant neoplasm of prostate: Secondary | ICD-10-CM | POA: Diagnosis not present

## 2024-04-22 DIAGNOSIS — I739 Peripheral vascular disease, unspecified: Secondary | ICD-10-CM | POA: Diagnosis not present

## 2024-04-22 DIAGNOSIS — M25461 Effusion, right knee: Secondary | ICD-10-CM | POA: Diagnosis not present

## 2024-04-22 DIAGNOSIS — M6281 Muscle weakness (generalized): Secondary | ICD-10-CM | POA: Diagnosis not present

## 2024-04-22 DIAGNOSIS — F32A Depression, unspecified: Secondary | ICD-10-CM | POA: Diagnosis not present

## 2024-04-22 DIAGNOSIS — Z79899 Other long term (current) drug therapy: Secondary | ICD-10-CM | POA: Diagnosis not present

## 2024-04-22 DIAGNOSIS — G8191 Hemiplegia, unspecified affecting right dominant side: Secondary | ICD-10-CM | POA: Diagnosis not present

## 2024-04-22 DIAGNOSIS — I69351 Hemiplegia and hemiparesis following cerebral infarction affecting right dominant side: Secondary | ICD-10-CM | POA: Diagnosis not present

## 2024-04-22 DIAGNOSIS — E785 Hyperlipidemia, unspecified: Secondary | ICD-10-CM | POA: Diagnosis not present

## 2024-04-22 DIAGNOSIS — N139 Obstructive and reflux uropathy, unspecified: Secondary | ICD-10-CM | POA: Diagnosis not present

## 2024-04-22 DIAGNOSIS — R338 Other retention of urine: Secondary | ICD-10-CM | POA: Diagnosis not present

## 2024-04-22 DIAGNOSIS — E118 Type 2 diabetes mellitus with unspecified complications: Secondary | ICD-10-CM | POA: Diagnosis not present

## 2024-04-22 DIAGNOSIS — Z794 Long term (current) use of insulin: Secondary | ICD-10-CM | POA: Diagnosis not present

## 2024-04-22 DIAGNOSIS — R1311 Dysphagia, oral phase: Secondary | ICD-10-CM | POA: Diagnosis not present

## 2024-04-22 DIAGNOSIS — I69320 Aphasia following cerebral infarction: Secondary | ICD-10-CM | POA: Diagnosis not present

## 2024-04-22 DIAGNOSIS — K219 Gastro-esophageal reflux disease without esophagitis: Secondary | ICD-10-CM | POA: Diagnosis not present

## 2024-04-22 DIAGNOSIS — Z6831 Body mass index (BMI) 31.0-31.9, adult: Secondary | ICD-10-CM | POA: Diagnosis not present

## 2024-04-22 DIAGNOSIS — I6930 Unspecified sequelae of cerebral infarction: Secondary | ICD-10-CM | POA: Diagnosis not present

## 2024-04-23 DIAGNOSIS — M6281 Muscle weakness (generalized): Secondary | ICD-10-CM | POA: Diagnosis not present

## 2024-04-23 DIAGNOSIS — C61 Malignant neoplasm of prostate: Secondary | ICD-10-CM | POA: Diagnosis not present

## 2024-04-23 DIAGNOSIS — R2689 Other abnormalities of gait and mobility: Secondary | ICD-10-CM | POA: Diagnosis not present

## 2024-04-25 DIAGNOSIS — F331 Major depressive disorder, recurrent, moderate: Secondary | ICD-10-CM | POA: Diagnosis not present

## 2024-04-25 DIAGNOSIS — C61 Malignant neoplasm of prostate: Secondary | ICD-10-CM | POA: Diagnosis not present

## 2024-04-25 DIAGNOSIS — R2689 Other abnormalities of gait and mobility: Secondary | ICD-10-CM | POA: Diagnosis not present

## 2024-04-25 DIAGNOSIS — M6281 Muscle weakness (generalized): Secondary | ICD-10-CM | POA: Diagnosis not present

## 2024-04-27 ENCOUNTER — Encounter: Payer: Self-pay | Admitting: Urology

## 2024-04-27 DIAGNOSIS — M6281 Muscle weakness (generalized): Secondary | ICD-10-CM | POA: Diagnosis not present

## 2024-04-27 DIAGNOSIS — R2689 Other abnormalities of gait and mobility: Secondary | ICD-10-CM | POA: Diagnosis not present

## 2024-04-27 DIAGNOSIS — C61 Malignant neoplasm of prostate: Secondary | ICD-10-CM | POA: Diagnosis not present

## 2024-05-01 DIAGNOSIS — C61 Malignant neoplasm of prostate: Secondary | ICD-10-CM | POA: Diagnosis not present

## 2024-05-01 DIAGNOSIS — R2689 Other abnormalities of gait and mobility: Secondary | ICD-10-CM | POA: Diagnosis not present

## 2024-05-01 DIAGNOSIS — M6281 Muscle weakness (generalized): Secondary | ICD-10-CM | POA: Diagnosis not present

## 2024-05-06 DIAGNOSIS — R339 Retention of urine, unspecified: Secondary | ICD-10-CM | POA: Diagnosis not present

## 2024-05-06 DIAGNOSIS — N139 Obstructive and reflux uropathy, unspecified: Secondary | ICD-10-CM | POA: Diagnosis not present

## 2024-05-10 ENCOUNTER — Inpatient Hospital Stay: Admitting: Nurse Practitioner

## 2024-05-10 ENCOUNTER — Encounter: Payer: Self-pay | Admitting: Nurse Practitioner

## 2024-05-10 ENCOUNTER — Inpatient Hospital Stay

## 2024-05-10 VITALS — BP 187/61 | HR 57 | Temp 97.6°F | Resp 18 | Ht 67.0 in | Wt 205.0 lb

## 2024-05-10 DIAGNOSIS — E119 Type 2 diabetes mellitus without complications: Secondary | ICD-10-CM | POA: Diagnosis not present

## 2024-05-10 DIAGNOSIS — C61 Malignant neoplasm of prostate: Secondary | ICD-10-CM | POA: Diagnosis not present

## 2024-05-10 DIAGNOSIS — G893 Neoplasm related pain (acute) (chronic): Secondary | ICD-10-CM

## 2024-05-10 DIAGNOSIS — Z515 Encounter for palliative care: Secondary | ICD-10-CM | POA: Diagnosis not present

## 2024-05-10 DIAGNOSIS — I10 Essential (primary) hypertension: Secondary | ICD-10-CM | POA: Diagnosis not present

## 2024-05-10 DIAGNOSIS — Z466 Encounter for fitting and adjustment of urinary device: Secondary | ICD-10-CM

## 2024-05-10 DIAGNOSIS — R530 Neoplastic (malignant) related fatigue: Secondary | ICD-10-CM | POA: Diagnosis not present

## 2024-05-10 DIAGNOSIS — F331 Major depressive disorder, recurrent, moderate: Secondary | ICD-10-CM | POA: Diagnosis not present

## 2024-05-10 NOTE — Telephone Encounter (Signed)
 Oncology Pharmacist Encounter  Provider aware that patient is unable to receive patient assistance from company unless patient is not living in a facility. Patient continues to reside in facility and we can continue to provide 2 more months of Nubeqa  therapy at this time. MD aware and will provide an alternative plan at the end of 2 months.  Will sign off at this time until more information is available.  Lou Loewe, PharmD Hematology/Oncology Clinical Pharmacist [facility] [phone]

## 2024-05-10 NOTE — Progress Notes (Signed)
 Palliative Medicine [facility]  Telephone:([phone] Fax:(336) [phone]   Name: [name] Date: 05/10/2024 MRN: [id number]  DOB: 09-25-1950  Patient Care Team: Adele Song, MD as PCP - General (Family Medicine) Verlin Lonni BIRCH, MD as PCP - Cardiology (Cardiology) Verlin Lonni BIRCH, MD (Cardiology) Vertell Pont, RN as Oncology Nurse Navigator Pickenpack-Cousar, Fannie SAILOR, NP as Nurse Practitioner (Hospice and Palliative Medicine) Silvano Valrie SQUIBB, RN as Registered Nurse    INTERVAL HISTORY: [name] is a 73 y.o. 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 Medical problems include history of CVA with residual right-sided numbness, dysarthria, dysphagia, type 2 diabetes, hypertension, PVD, CHF, HLD, depression, and stage 2 CKD.   SOCIAL HISTORY:     reports that he has never smoked. He has never used smokeless tobacco. He reports that he does not drink alcohol and does not use drugs.  ADVANCE DIRECTIVES:  Currently full code, no MOST/HCPOA established, discussed DNR/DNI, will plan to follow up at next appt after he can discuss this with his children   CODE STATUS: Full code  PAST MEDICAL HISTORY: Past Medical History:  Diagnosis Date   CVA (cerebral infarction) 01/03/2008   MRI: Acute 1 x 1.5 cm infarction affecting the left side of the pons.   Diabetes mellitus    DVT (deep venous thrombosis) (HCC)    Hypertension    PAD (peripheral artery disease)    Prostate cancer (HCC)    Stroke (HCC)    2008    ALLERGIES:  is allergic to bovine (beef) protein-containing drug products, pork allergy, and shrimp flavor agent (non-screening).  MEDICATIONS:  Current Outpatient Medications  Medication Sig Dispense Refill   acetaminophen  (TYLENOL ) 325 MG tablet Take 2 tablets (650 mg total) by mouth every 6 (six) hours as needed for mild pain (pain score 1-3) (or Fever >/= 101).     ADMELOG SOLOSTAR 100  UNIT/ML KwikPen Inject into the skin.     amlodipine -olmesartan  (AZOR ) 10-20 MG tablet Take 1 tablet by mouth daily. PLEASE SCHEDULE APPT PRIOR TO NEXT REFILL 90 tablet 0   aspirin  EC 81 MG tablet Take 1 tablet (81 mg total) by mouth daily. Swallow whole. 30 tablet 12   atorvastatin  (LIPITOR) 40 MG tablet Take 1 tablet (40 mg total) by mouth daily. 90 tablet 3   calcium  carbonate (OSCAL) 1500 (600 Ca) MG TABS tablet Take 1,200 mg of elemental calcium  by mouth daily with breakfast.     carvedilol  (COREG ) 12.5 MG tablet Take 1 tablet (12.5 mg total) by mouth 2 (two) times daily with a meal. 180 tablet 3   cholecalciferol (VITAMIN D3) 10 MCG/ML LIQD oral liquid Take 400 Units by mouth daily.     clopidogrel  (PLAVIX ) 75 MG tablet Take 1 tablet (75 mg total) by mouth daily. 90 tablet 2   darolutamide  (NUBEQA ) 300 MG tablet Take 2 tablets (600 mg total) by mouth 2 (two) times daily with a meal. 120 tablet 0   insulin  aspart (NOVOLOG ) 100 UNIT/ML injection Inject 0-5 Units into the skin at bedtime.     insulin  aspart (NOVOLOG ) 100 UNIT/ML injection Inject 0-6 Units into the skin 3 (three) times daily with meals.     Insulin  Glargine (BASAGLAR  KWIKPEN) 100 UNIT/ML Inject into the skin.     insulin  glargine-yfgn (SEMGLEE ) 100 UNIT/ML injection Inject 0.12 mLs (12 Units total) into the skin daily.     JARDIANCE  25 MG TABS tablet Take 25 mg by mouth  daily.     metFORMIN  (GLUCOPHAGE ) 1000 MG tablet Take 1 tablet (1,000 mg total) by mouth 2 (two) times daily with a meal. 180 tablet 3   Nutritional Supplements (GLUCERNA 1.5 CAL/CARBSTEADY) LIQD Take 237 mLs by mouth 2 (two) times daily. 14220 mL 12   pantoprazole  (PROTONIX ) 20 MG tablet TAKE 1 TABLET BY MOUTH EVERY DAY 90 tablet 1   sertraline  (ZOLOFT ) 100 MG tablet TAKE 1 TABLET BY MOUTH EVERY DAY 90 tablet 2   tamsulosin  (FLOMAX ) 0.4 MG CAPS capsule Take 0.4 mg by mouth daily.     traMADol (ULTRAM) 50 MG tablet Take by mouth every 6 (six) hours as needed.      No current facility-administered medications for this visit.    VITAL SIGNS: BP (!) 187/61 (BP Location: Left Arm, Patient Position: Sitting)   Pulse (!) 57   Temp 97.6 F (36.4 C) (Temporal)   Resp 18   Ht 5' 7 (1.702 m)   Wt 205 lb (93 kg)   SpO2 99%   BMI 32.11 kg/m  Filed Weights   05/10/24 0930  Weight: 205 lb (93 kg)    Estimated body mass index is 32.11 kg/m as calculated from the following:   Height as of this encounter: 5' 7 (1.702 m).   Weight as of this encounter: 205 lb (93 kg).  PERFORMANCE STATUS (ECOG) : 1 - Symptomatic but completely ambulatory  Physical Exam General: NAD, wheelchair  Cardiovascular: regular rate and rhythm Pulmonary: normal breathing pattern Extremities: no edema, no joint deformities Skin: no rashes Neurological: AAO x3  IMPRESSION: Discussed the use of AI scribe software for clinical note transcription with the patient, who gave verbal consent to proceed.  History of Present Illness [name] is a 73 year old male who presents for a follow-up. No acute distress. He is a resident at [facility]. Denies concerns of nausea, vomiting, constipation, or diarrhea. Denies pain. He is accompanied by his daughter.   His Foley catheter was changed this morning at the facility, but it is currently leaking. Per daughter, his catheter is usually changed by Urology. We have sent notification to facility to assess and change out per their protocol or make arrangements for this to occur ASAP. His blood pressure is slightly elevated this morning at 188/73.  He is unsure if he took his blood pressure medications this morning and notes that the facility does not administer them at a specific time. Daughter plans to further discuss medication regimen and timing upon return to facility. His medication sheet has documented medications are on schedule for administration as ordered.   [name] appetite is fair. Some days are better than others.  He expresses concerns with the amount of sugar and carbohydrates he is eating despite awareness that he should be on a diabetic diet. Patient's documents indicate regular diet. Instructions to manage sugar and carbohydrates sent to facility to indicate a diabetic regimen.   He continues to experience difficulty sleeping due to noise at his facility. No significant pain or new symptoms are reported. Pain controlled with pain medications as needed.   He wants to return home but acknowledges that he requires 24/7 care. His family member is unable to provide this level of care due to other responsibilities.   All questions answered and support provided.  Assessment & Plan Insomnia related to facility environment Insomnia attributed to environmental noise, including a roommate's TV and general facility noise. Caffeine pills considered for daytime alertness but not recommended  due to cardiovascular risks. - Discuss with facility management the possibility of changing rooms to reduce noise. - Advise against the use of caffeine pills due to potential cardiovascular risks.  Indwelling urinary catheter management The indwelling urinary catheter was changed this morning by the facility, which is not the recommended practice. The catheter is leaking, and there is a lack of appropriate supplies for replacement. The facility is not equipped to handle difficult catheter placements, which should be managed by urology. - Noted in facility notes that the catheter needs to be changed upon return to the facility. - Ensure catheter changes are performed by urology for difficult placements. -Urology tube requires monthly changes managed by [facility]. - Ensure appointments with [facility] as required.   Hypertension Blood pressure is elevated at 188/73 mmHg. He is unsure if he took his antihypertensive medications this morning. The facility administers medications without specific timing, contributing to uncertainty  about medication adherence. - Monitor blood pressure regularly. - Ensure administration of antihypertensive medications as prescribed.  Type 2 diabetes mellitus He is not adhering to a diabetic diet as recommended. The facility is providing a diet high in sugar and carbohydrates, which is not appropriate for his condition per patient. - Ensure adherence to a diabetic diet. - Monitor sugar and carbohydrate intake.  Will see patient back in 3-4 weeks. Sooner if needed.   Patient expressed understanding and was in agreement with this plan. He also understands that He can call the clinic at any time with any questions, concerns, or complaints.   Any controlled substances utilized were prescribed in the context of palliative care. PDMP has been reviewed.   Visit consisted of counseling and education dealing with the complex and emotionally intense issues of symptom management and palliative care in the setting of serious and potentially life-threatening illness.  Levon Borer, AGPCNP-BC  Palliative Medicine Team/[facility]

## 2024-05-11 DIAGNOSIS — F32A Depression, unspecified: Secondary | ICD-10-CM | POA: Diagnosis not present

## 2024-05-11 DIAGNOSIS — G8929 Other chronic pain: Secondary | ICD-10-CM | POA: Diagnosis not present

## 2024-05-11 DIAGNOSIS — C61 Malignant neoplasm of prostate: Secondary | ICD-10-CM | POA: Diagnosis not present

## 2024-05-11 DIAGNOSIS — Z79899 Other long term (current) drug therapy: Secondary | ICD-10-CM | POA: Diagnosis not present

## 2024-05-11 DIAGNOSIS — R338 Other retention of urine: Secondary | ICD-10-CM | POA: Diagnosis not present

## 2024-05-11 DIAGNOSIS — Z6831 Body mass index (BMI) 31.0-31.9, adult: Secondary | ICD-10-CM | POA: Diagnosis not present

## 2024-05-11 DIAGNOSIS — I69351 Hemiplegia and hemiparesis following cerebral infarction affecting right dominant side: Secondary | ICD-10-CM | POA: Diagnosis not present

## 2024-05-12 DIAGNOSIS — C61 Malignant neoplasm of prostate: Secondary | ICD-10-CM | POA: Diagnosis not present

## 2024-05-12 DIAGNOSIS — G8929 Other chronic pain: Secondary | ICD-10-CM | POA: Diagnosis not present

## 2024-05-13 DIAGNOSIS — R338 Other retention of urine: Secondary | ICD-10-CM | POA: Diagnosis not present

## 2024-05-13 DIAGNOSIS — Z79899 Other long term (current) drug therapy: Secondary | ICD-10-CM | POA: Diagnosis not present

## 2024-05-13 DIAGNOSIS — C61 Malignant neoplasm of prostate: Secondary | ICD-10-CM | POA: Diagnosis not present

## 2024-05-13 DIAGNOSIS — M25571 Pain in right ankle and joints of right foot: Secondary | ICD-10-CM | POA: Diagnosis not present

## 2024-05-13 DIAGNOSIS — I69351 Hemiplegia and hemiparesis following cerebral infarction affecting right dominant side: Secondary | ICD-10-CM | POA: Diagnosis not present

## 2024-05-13 DIAGNOSIS — K219 Gastro-esophageal reflux disease without esophagitis: Secondary | ICD-10-CM | POA: Diagnosis not present

## 2024-05-13 DIAGNOSIS — E785 Hyperlipidemia, unspecified: Secondary | ICD-10-CM | POA: Diagnosis not present

## 2024-05-13 DIAGNOSIS — F32A Depression, unspecified: Secondary | ICD-10-CM | POA: Diagnosis not present

## 2024-05-13 DIAGNOSIS — Z191 Hormone sensitive malignancy status: Secondary | ICD-10-CM | POA: Diagnosis not present

## 2024-05-13 DIAGNOSIS — E119 Type 2 diabetes mellitus without complications: Secondary | ICD-10-CM | POA: Diagnosis not present

## 2024-05-13 DIAGNOSIS — Z6833 Body mass index (BMI) 33.0-33.9, adult: Secondary | ICD-10-CM | POA: Diagnosis not present

## 2024-05-13 DIAGNOSIS — Z794 Long term (current) use of insulin: Secondary | ICD-10-CM | POA: Diagnosis not present

## 2024-05-14 DIAGNOSIS — E118 Type 2 diabetes mellitus with unspecified complications: Secondary | ICD-10-CM | POA: Diagnosis not present

## 2024-05-16 DIAGNOSIS — C61 Malignant neoplasm of prostate: Secondary | ICD-10-CM | POA: Diagnosis not present

## 2024-05-16 DIAGNOSIS — E118 Type 2 diabetes mellitus with unspecified complications: Secondary | ICD-10-CM | POA: Diagnosis not present

## 2024-05-16 DIAGNOSIS — N182 Chronic kidney disease, stage 2 (mild): Secondary | ICD-10-CM | POA: Diagnosis not present

## 2024-05-16 DIAGNOSIS — E785 Hyperlipidemia, unspecified: Secondary | ICD-10-CM | POA: Diagnosis not present

## 2024-05-17 DIAGNOSIS — E118 Type 2 diabetes mellitus with unspecified complications: Secondary | ICD-10-CM | POA: Diagnosis not present

## 2024-05-17 DIAGNOSIS — Z191 Hormone sensitive malignancy status: Secondary | ICD-10-CM | POA: Diagnosis not present

## 2024-05-17 DIAGNOSIS — Z79899 Other long term (current) drug therapy: Secondary | ICD-10-CM | POA: Diagnosis not present

## 2024-05-17 DIAGNOSIS — F32A Depression, unspecified: Secondary | ICD-10-CM | POA: Diagnosis not present

## 2024-05-17 DIAGNOSIS — M25571 Pain in right ankle and joints of right foot: Secondary | ICD-10-CM | POA: Diagnosis not present

## 2024-05-17 DIAGNOSIS — Z6833 Body mass index (BMI) 33.0-33.9, adult: Secondary | ICD-10-CM | POA: Diagnosis not present

## 2024-05-17 DIAGNOSIS — R338 Other retention of urine: Secondary | ICD-10-CM | POA: Diagnosis not present

## 2024-05-17 DIAGNOSIS — E785 Hyperlipidemia, unspecified: Secondary | ICD-10-CM | POA: Diagnosis not present

## 2024-05-17 DIAGNOSIS — I69351 Hemiplegia and hemiparesis following cerebral infarction affecting right dominant side: Secondary | ICD-10-CM | POA: Diagnosis not present

## 2024-05-17 DIAGNOSIS — Z794 Long term (current) use of insulin: Secondary | ICD-10-CM | POA: Diagnosis not present

## 2024-05-17 DIAGNOSIS — C61 Malignant neoplasm of prostate: Secondary | ICD-10-CM | POA: Diagnosis not present

## 2024-05-17 DIAGNOSIS — K219 Gastro-esophageal reflux disease without esophagitis: Secondary | ICD-10-CM | POA: Diagnosis not present

## 2024-05-19 DIAGNOSIS — F32A Depression, unspecified: Secondary | ICD-10-CM | POA: Diagnosis not present

## 2024-05-19 DIAGNOSIS — G4709 Other insomnia: Secondary | ICD-10-CM | POA: Diagnosis not present

## 2024-05-19 DIAGNOSIS — C61 Malignant neoplasm of prostate: Secondary | ICD-10-CM | POA: Diagnosis not present

## 2024-05-23 ENCOUNTER — Telehealth: Payer: Self-pay

## 2024-05-23 NOTE — Telephone Encounter (Signed)
 Called to check when the daughter was coming in to pick up medication. Sates she will be here prior to 5 pm. Pharmacist made aware. Arland Legions BSN RN

## 2024-05-24 ENCOUNTER — Telehealth: Payer: Self-pay

## 2024-05-24 NOTE — Telephone Encounter (Signed)
 Medication Samples have been provided to the patient. Patient received on 05/23/2024.    Drug name: Nubeqa  (darolutamide )        Strength: 300mg          Qty: 120                       LOT: [id number]             Exp.Date: 04/01/2026   Dosing instructions: Take 2 tablets (600mg ) by mouth twice daily with food.    The patient has been instructed regarding the correct time, dose, and frequency of taking this medication, including desired effects and most common side effects.   Amilliana Hayworth, PharmD Hematology/Oncology Clinical Pharmacist [facility] [phone]

## 2024-06-20 NOTE — Progress Notes (Unsigned)
 [facility] OFFICE PROGRESS NOTE  Patient Care Team: Adele Song, MD as PCP - General (Family Medicine) Verlin Lonni BIRCH, MD as PCP - Cardiology (Cardiology) Verlin Lonni BIRCH, MD (Cardiology) Vertell Pont, RN as Oncology Nurse Navigator Pickenpack-Cousar, Fannie SAILOR, NP as Nurse Practitioner (Hospice and Palliative Medicine) Silvano Valrie SQUIBB, RN as Registered Nurse  [name] is a 74 y.o.male with history of CVAs with chronic right-sided weakness dysarthria and dysphagia, DM, HTN, PAD, and prostate cancer [facility].    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 GS 4+5 GG5 in 4 cores. PSA 150. PET with multifocal skeletal metastasis involving the pelvis, spine and LEFT ribs and a large RIGHT common iliac node. He was started on ADT in 01/2022 and AAP in 04/2022. PSA nadir in 10/2022 of 0.36 followed by rising PSA.    He is responding to darolutamide  very well.  Unfortunately, his insurance had declined approval.  Several appeal have been done.  Manufacturer is providing free drugs.   Overall no clinical changes.  Last PSA appears stable.  Will continue.   Current diagnosis: mCRPC Initial diagnosis: mHSPC Germline testing: Negative Somatic testing: Tempus. PIK3CA p.E542K missense variant (exon 9) 1.1 %. DNMT3A missense variant 29%. AR missense variant 3.4%. TMB 7.2. VUS of 7 genes. Treatment: ADT/AAP 02/28/22 ADT 04/2022 AAP 10/2022 PSA nadir 0.36. followed by rising PSA 04/2023 PSA 1.38 10/2023 PSA 3.2 T<10 11/04/23 PSA 10.7 T<10 12/24/23-01/06/24 SBRT Pelvis. 02/17/24 PSA 0.3 T 193 03/31/24 PSA 0.04. 04/20/24 PSA <0.1 Assessment & Plan Prostate cancer (HCC) 7/24-01/06/24 SBRT for solitary metastasis Genetic testing neg somatic testing showed PIK3CA, DNMT3A and AR missense variant. TMB 7.2 Changed to darolutamide . Difficulty obtain insurance approval from his [employer]. [facility] ADT at [facility],  will receive Eligard  45mg  on 9/24  Continue vitamin D calcium  Continue to control hypertension, cardiovascular risk factors Return in about 2 months History of CVA (cerebrovascular accident) On aspirin  and plavix  and statin Stroke left him to be on wheelchair with persistent right-sided weakness.  Limited performance status. If developing considerable side effect, or disease progression, would not be a candidate for further aggressive therapy. No changes.  Orders Placed This Encounter  Procedures   CMP (Cancer Center only)    Standing Status:   Future    Expiration Date:   06/21/2025   CBC with Differential (Cancer Center Only)    Standing Status:   Future    Expiration Date:   06/21/2025   Testosterone     Standing Status:   Future    Expiration Date:   06/21/2025   PSA    Standing Status:   Future    Expiration Date:   06/21/2025     Kyle JAYSON Chihuahua, MD  INTERVAL HISTORY: Patient returns for follow-up. No new stroke like symptoms, chest pain or pressure. Baseline right side weakness is the same. No bleeding.  Oncology History  Prostate cancer (HCC)  11/2021 Initial Diagnosis   Prostate cancer (HCC) De novo with bone, LN metastases. PSA 150. GG5 4+5 in 4 cores. Rest were 4+3 lesions   02/26/2022 PET scan   PSMA PET 1. Intense activity throughout the prostate gland consistent with primary prostate adenocarcinoma. Prostate gland enlarged. Foley catheter in place. 2. Suspicion of local prostate cancer extension to the LEFT seminal vesicle. 3. Multifocal radiotracer avid skeletal metastasis involving the pelvis, spine and LEFT ribs. There are seven lesions identified. 4. Large RIGHT common  iliac node with mild radiotracer activity is favored necrotic nodal metastasis. 5. Incidental finding of a RIGHT inguinal hernia which contains nonobstructed loop of small bowel.   10/09/2023 PET scan   PSMA PET 1. Persistent radiotracer activity the posterior RIGHT acetabulum with  associated bone sclerosis. Other bone lesions have resolved in the interval. 2. Interval resolution of metastatic adenopathy in the pelvis. 3. No new adenopathy or visceral metastasis identified 4. Foley catheter within collapsed bladder.   01/19/2024 Cancer Staging   Staging form: Prostate, AJCC 8th Edition - Clinical: Stage IVB (cTX, cN1, cM1b, Grade Group: 5) - Signed by Tina Kyle BROCKS, MD on 01/19/2024 Histologic grading system: 5 grade system      PHYSICAL EXAMINATION: ECOG PERFORMANCE STATUS: 3 VSS  GENERAL: alert, no distress and comfortable on wheelchair LUNGS: clear to auscultation and no wheeze or rales with normal breathing effort HEART: regular rate & rhythm  ABDOMEN: abdomen soft, non-tender and nondistended. NEURO: Baseline right arm and leg 3/5.  Relevant data reviewed during this visit included labs.  New labs ordered.

## 2024-06-21 ENCOUNTER — Inpatient Hospital Stay: Admitting: Nurse Practitioner

## 2024-06-21 ENCOUNTER — Inpatient Hospital Stay

## 2024-06-21 ENCOUNTER — Encounter: Payer: Self-pay | Admitting: Nurse Practitioner

## 2024-06-21 ENCOUNTER — Telehealth: Payer: Self-pay

## 2024-06-21 VITALS — BP 143/53 | HR 61 | Temp 98.2°F | Resp 17 | Ht 67.0 in

## 2024-06-21 DIAGNOSIS — C61 Malignant neoplasm of prostate: Secondary | ICD-10-CM

## 2024-06-21 DIAGNOSIS — Z8673 Personal history of transient ischemic attack (TIA), and cerebral infarction without residual deficits: Secondary | ICD-10-CM | POA: Diagnosis not present

## 2024-06-21 DIAGNOSIS — Z515 Encounter for palliative care: Secondary | ICD-10-CM | POA: Diagnosis not present

## 2024-06-21 DIAGNOSIS — G893 Neoplasm related pain (acute) (chronic): Secondary | ICD-10-CM | POA: Diagnosis not present

## 2024-06-21 DIAGNOSIS — R530 Neoplastic (malignant) related fatigue: Secondary | ICD-10-CM

## 2024-06-21 DIAGNOSIS — Z960 Presence of urogenital implants: Secondary | ICD-10-CM

## 2024-06-21 LAB — CBC WITH DIFFERENTIAL (CANCER CENTER ONLY)
Abs Immature Granulocytes: 0.01 K/uL (ref 0.00–0.07)
Basophils Absolute: 0.1 K/uL (ref 0.0–0.1)
Basophils Relative: 1 %
Eosinophils Absolute: 0.4 K/uL (ref 0.0–0.5)
Eosinophils Relative: 4 %
HCT: 38.4 % — ABNORMAL LOW (ref 39.0–52.0)
Hemoglobin: 12.3 g/dL — ABNORMAL LOW (ref 13.0–17.0)
Immature Granulocytes: 0 %
Lymphocytes Relative: 27 %
Lymphs Abs: 2.4 K/uL (ref 0.7–4.0)
MCH: 27.5 pg (ref 26.0–34.0)
MCHC: 32 g/dL (ref 30.0–36.0)
MCV: 85.7 fL (ref 80.0–100.0)
Monocytes Absolute: 0.6 K/uL (ref 0.1–1.0)
Monocytes Relative: 6 %
Neutro Abs: 5.5 K/uL (ref 1.7–7.7)
Neutrophils Relative %: 62 %
Platelet Count: 188 K/uL (ref 150–400)
RBC: 4.48 MIL/uL (ref 4.22–5.81)
RDW: 14.4 % (ref 11.5–15.5)
WBC Count: 8.8 K/uL (ref 4.0–10.5)
nRBC: 0 % (ref 0.0–0.2)

## 2024-06-21 LAB — CMP (CANCER CENTER ONLY)
ALT: 13 U/L (ref 0–44)
AST: 21 U/L (ref 15–41)
Albumin: 4.2 g/dL (ref 3.5–5.0)
Alkaline Phosphatase: 59 U/L (ref 38–126)
Anion gap: 13 (ref 5–15)
BUN: 23 mg/dL (ref 8–23)
CO2: 24 mmol/L (ref 22–32)
Calcium: 9.2 mg/dL (ref 8.9–10.3)
Chloride: 100 mmol/L (ref 98–111)
Creatinine: 1.19 mg/dL (ref 0.61–1.24)
GFR, Estimated: >60 mL/min
Glucose, Bld: 196 mg/dL — ABNORMAL HIGH (ref 70–99)
Potassium: 4.2 mmol/L (ref 3.5–5.1)
Sodium: 138 mmol/L (ref 135–145)
Total Bilirubin: 0.4 mg/dL (ref 0.0–1.2)
Total Protein: 7.3 g/dL (ref 6.5–8.1)

## 2024-06-21 LAB — PSA: Prostatic Specific Antigen: 0.06 ng/mL (ref 0.00–4.00)

## 2024-06-21 NOTE — Assessment & Plan Note (Addendum)
 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 TMB 7.2 Changed to darolutamide . Difficulty obtain insurance approval from his [employer]. Continue ADT at [facility], will receive Eligard  45mg  on 9/24  Continue vitamin D calcium  Continue to control hypertension, cardiovascular risk factors Return in about 2 months

## 2024-06-21 NOTE — Assessment & Plan Note (Addendum)
 On aspirin  and plavix  and statin Stroke left him to be on wheelchair with persistent right-sided weakness.  Limited performance status. If developing considerable side effect, or disease progression, would not be a candidate for further aggressive therapy. No changes.

## 2024-06-21 NOTE — Telephone Encounter (Signed)
 Medication Samples have been provided to the patient. Patient received on 06/21/2024.    Drug name: Nubeqa  (darolutamide )        Strength: 300mg          Qty: 120                       LOT: [id number]             Exp.Date: 04/01/2026   Dosing instructions: Take 2 tablets (600mg ) by mouth twice daily with food.    The patient has been instructed regarding the correct time, dose, and frequency of taking this medication, including desired effects and most common side effects.   Shinichi Anguiano, PharmD Hematology/Oncology Clinical Pharmacist [facility] [phone]

## 2024-06-22 LAB — TESTOSTERONE: Testosterone: <3 ng/dL — ABNORMAL LOW (ref 264–916)

## 2024-06-22 NOTE — Progress Notes (Signed)
 "    Palliative Medicine [facility]  Telephone:(336) [phone] Fax:(336) [phone]   Name: [name] Date: 06/22/2024 MRN: [id number]  DOB: 10-08-1950  Patient Care Team: Adele Song, MD as PCP - General (Family Medicine) Verlin Lonni BIRCH, MD as PCP - Cardiology (Cardiology) Verlin Lonni BIRCH, MD (Cardiology) Vertell Pont, RN as Oncology Nurse Navigator Pickenpack-Cousar, Fannie SAILOR, NP as Nurse Practitioner (Hospice and Palliative Medicine) Silvano Valrie SQUIBB, RN as Registered Nurse    INTERVAL HISTORY: [name] is a 74 y.o. 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 Medical problems include history of CVA with residual right-sided numbness, dysarthria, dysphagia, type 2 diabetes, hypertension, PVD, CHF, HLD, depression, and stage 2 CKD.   SOCIAL HISTORY:     reports that he has never smoked. He has never used smokeless tobacco. He reports that he does not drink alcohol and does not use drugs.  ADVANCE DIRECTIVES:  Currently full code, no MOST/HCPOA established, discussed DNR/DNI, will plan to follow up at next appt after he can discuss this with his children   CODE STATUS: Full code  PAST MEDICAL HISTORY: Past Medical History:  Diagnosis Date   CVA (cerebral infarction) 01/03/2008   MRI: Acute 1 x 1.5 cm infarction affecting the left side of the pons.   Diabetes mellitus    DVT (deep venous thrombosis) (HCC)    Hypertension    PAD (peripheral artery disease)    Prostate cancer (HCC)    Stroke (HCC)    2008    ALLERGIES:  is allergic to bovine (beef) protein-containing drug products, pork allergy, and shrimp flavor agent (non-screening).  MEDICATIONS:  Current Outpatient Medications  Medication Sig Dispense Refill   acetaminophen  (TYLENOL ) 325 MG tablet Take 2 tablets (650 mg total) by mouth every 6 (six) hours as needed for mild pain (pain score 1-3) (or Fever >/= 101).     ADMELOG SOLOSTAR 100  UNIT/ML KwikPen Inject into the skin.     amlodipine -olmesartan  (AZOR ) 10-20 MG tablet Take 1 tablet by mouth daily. PLEASE SCHEDULE APPT PRIOR TO NEXT REFILL 90 tablet 0   aspirin  EC 81 MG tablet Take 1 tablet (81 mg total) by mouth daily. Swallow whole. 30 tablet 12   atorvastatin  (LIPITOR) 40 MG tablet Take 1 tablet (40 mg total) by mouth daily. 90 tablet 3   calcium  carbonate (OSCAL) 1500 (600 Ca) MG TABS tablet Take 1,200 mg of elemental calcium  by mouth daily with breakfast.     carvedilol  (COREG ) 12.5 MG tablet Take 1 tablet (12.5 mg total) by mouth 2 (two) times daily with a meal. 180 tablet 3   cholecalciferol (VITAMIN D3) 10 MCG/ML LIQD oral liquid Take 400 Units by mouth daily.     clopidogrel  (PLAVIX ) 75 MG tablet Take 1 tablet (75 mg total) by mouth daily. 90 tablet 2   darolutamide  (NUBEQA ) 300 MG tablet Take 2 tablets (600 mg total) by mouth 2 (two) times daily with a meal. 120 tablet 0   insulin  aspart (NOVOLOG ) 100 UNIT/ML injection Inject 0-5 Units into the skin at bedtime.     insulin  aspart (NOVOLOG ) 100 UNIT/ML injection Inject 0-6 Units into the skin 3 (three) times daily with meals.     Insulin  Glargine (BASAGLAR  KWIKPEN) 100 UNIT/ML Inject into the skin.     insulin  glargine-yfgn (SEMGLEE ) 100 UNIT/ML injection Inject 0.12 mLs (12 Units total) into the skin daily.     JARDIANCE  25 MG TABS tablet Take 25 mg by  mouth daily.     metFORMIN  (GLUCOPHAGE ) 1000 MG tablet Take 1 tablet (1,000 mg total) by mouth 2 (two) times daily with a meal. 180 tablet 3   Nutritional Supplements (GLUCERNA 1.5 CAL/CARBSTEADY) LIQD Take 237 mLs by mouth 2 (two) times daily. 14220 mL 12   pantoprazole  (PROTONIX ) 20 MG tablet TAKE 1 TABLET BY MOUTH EVERY DAY 90 tablet 1   sertraline  (ZOLOFT ) 100 MG tablet TAKE 1 TABLET BY MOUTH EVERY DAY 90 tablet 2   tamsulosin  (FLOMAX ) 0.4 MG CAPS capsule Take 0.4 mg by mouth daily.     traMADol (ULTRAM) 50 MG tablet Take by mouth every 6 (six) hours as needed.      No current facility-administered medications for this visit.    VITAL SIGNS: BP (!) 143/53   Pulse 61   Temp 98.2 F (36.8 C)   Resp 17   Ht 5' 7 (1.702 m)   SpO2 100%   BMI 32.11 kg/m  There were no vitals filed for this visit.   Estimated body mass index is 32.11 kg/m as calculated from the following:   Height as of this encounter: 5' 7 (1.702 m).   Weight as of 05/10/24: 205 lb (93 kg).  PERFORMANCE STATUS (ECOG) : 1 - Symptomatic but completely ambulatory  Physical Exam General: NAD, wheelchair  Cardiovascular: regular rate and rhythm Pulmonary: normal breathing pattern Extremities: no edema, no joint deformities Skin: no rashes Neurological: AAO x3  IMPRESSION: Discussed the use of AI scribe software for clinical note transcription with the patient, who gave verbal consent to proceed.  History of Present Illness [name] is a 74 year old male with metastatic prostate cancer who presents to clinic for follow-up. No acute distress noted. He is accompanied by his daughter. Patient continues to reside at [facility] facility. Denies concerns for uncontrolled nausea, vomiting, constipation, or diarrhea. Reports his appetite is fair. Some days are better than others pending food at facility.   His pain is currently managed with tramadol 50mg  taken twice daily. He also takes Nubeqa , two tablets twice a day with meals. Tolerating medications without difficulty.   He has a catheter in place and mentions that the nursing home staff attend to it at his convenience. He recalls a previous incident where the catheter was leaking and was informed that only the bag was changed, not the catheter itself. He typically goes to [facility] for catheter changes, but is uncertain about the specifics of his appointments as he relies on the nursing home staff to manage them.  He expresses frustration with the nursing home staff regarding the handling of his personal belongings and  meals, citing incidents where his shoes were allegedly thrown away and his food was mishandled. He has requested Ziploc bags to store his meals properly.  No unmet palliative needs at this time. All questions answered and support provided.   Assessment & Plan Prostate cancer Managed with Nubeqa , taken as two tablets twice a day with meals.  - Ensure Nubeqa  is administered as prescribed, two tablets twice a day with meals.  Chronic cancer related pain Managed with tramadol, taken twice a day. Pain is reported to be better. - Continue tramadol twice a day for pain management.  Indwelling urinary catheter management Indwelling urinary catheter requires regular changes. There is confusion regarding the frequency and location of catheter changes. He reports going to the urology center for changes, but there is uncertainty about the process and timing. - Confirm the schedule and  location for catheter changes with the nursing home staff. - Ensure regular catheter changes at the urology center as needed.  I will plan to see patient back in 4-6 weeks. Sooner if needed.   Patient expressed understanding and was in agreement with this plan. He also understands that He can call the clinic at any time with any questions, concerns, or complaints.   Any controlled substances utilized were prescribed in the context of palliative care. PDMP has been reviewed.   I personally spent a total of 30 minutes in the care of the patient today including preparing to see the patient, getting/reviewing separately obtained history, performing a medically appropriate exam/evaluation, counseling and educating, and documenting clinical information in the EHR. Visit consisted of counseling and education dealing with the complex and emotionally intense issues of symptom management and palliative care in the setting of serious and potentially life-threatening illness.  Levon Borer, AGPCNP-BC  Palliative Medicine  Team/[facility]    "

## 2024-07-07 ENCOUNTER — Ambulatory Visit (HOSPITAL_BASED_OUTPATIENT_CLINIC_OR_DEPARTMENT_OTHER)
Admission: RE | Admit: 2024-07-07 | Discharge: 2024-07-07 | Disposition: A | Source: Ambulatory Visit | Attending: Vascular Surgery

## 2024-07-07 ENCOUNTER — Ambulatory Visit (HOSPITAL_COMMUNITY)
Admission: RE | Admit: 2024-07-07 | Discharge: 2024-07-07 | Disposition: A | Source: Ambulatory Visit | Attending: Vascular Surgery

## 2024-07-07 ENCOUNTER — Ambulatory Visit

## 2024-07-07 DIAGNOSIS — I739 Peripheral vascular disease, unspecified: Secondary | ICD-10-CM | POA: Diagnosis not present

## 2024-07-07 DIAGNOSIS — I70245 Atherosclerosis of native arteries of left leg with ulceration of other part of foot: Secondary | ICD-10-CM

## 2024-07-07 LAB — VAS US ABI WITH/WO TBI: Right ABI: 0.86

## 2024-08-02 ENCOUNTER — Inpatient Hospital Stay

## 2024-08-04 ENCOUNTER — Ambulatory Visit: Admitting: Vascular Surgery

## 2024-08-19 ENCOUNTER — Inpatient Hospital Stay
